# Patient Record
Sex: Female | Born: 1950 | Race: White | Hispanic: No | Marital: Single | Smoking: Never smoker
Health system: Southern US, Community
[De-identification: ages and names within clinical notes are randomized; demographics above are authoritative.]

## PROBLEM LIST (undated history)

## (undated) DIAGNOSIS — R0600 Dyspnea, unspecified: Secondary | ICD-10-CM

## (undated) DIAGNOSIS — F32A Depression, unspecified: Secondary | ICD-10-CM

## (undated) DIAGNOSIS — E039 Hypothyroidism, unspecified: Secondary | ICD-10-CM

## (undated) DIAGNOSIS — K219 Gastro-esophageal reflux disease without esophagitis: Secondary | ICD-10-CM

## (undated) DIAGNOSIS — J3089 Other allergic rhinitis: Secondary | ICD-10-CM

## (undated) DIAGNOSIS — M1990 Unspecified osteoarthritis, unspecified site: Secondary | ICD-10-CM

## (undated) DIAGNOSIS — G4730 Sleep apnea, unspecified: Secondary | ICD-10-CM

## (undated) DIAGNOSIS — E785 Hyperlipidemia, unspecified: Secondary | ICD-10-CM

## (undated) DIAGNOSIS — F329 Major depressive disorder, single episode, unspecified: Secondary | ICD-10-CM

## (undated) DIAGNOSIS — C9590 Leukemia, unspecified not having achieved remission: Secondary | ICD-10-CM

## (undated) DIAGNOSIS — Z972 Presence of dental prosthetic device (complete) (partial): Secondary | ICD-10-CM

## (undated) DIAGNOSIS — R062 Wheezing: Secondary | ICD-10-CM

## (undated) DIAGNOSIS — I10 Essential (primary) hypertension: Secondary | ICD-10-CM

## (undated) HISTORY — DX: Depression, unspecified: F32.A

## (undated) HISTORY — DX: Major depressive disorder, single episode, unspecified: F32.9

## (undated) HISTORY — DX: Hyperlipidemia, unspecified: E78.5

---

## 1983-04-26 HISTORY — PX: FOOT SURGERY: SHX648

## 1987-04-26 HISTORY — PX: ABDOMINAL HYSTERECTOMY: SHX81

## 1997-09-05 ENCOUNTER — Ambulatory Visit (HOSPITAL_COMMUNITY): Admission: RE | Admit: 1997-09-05 | Discharge: 1997-09-05 | Payer: Self-pay | Admitting: Obstetrics and Gynecology

## 1999-02-11 ENCOUNTER — Other Ambulatory Visit: Admission: RE | Admit: 1999-02-11 | Discharge: 1999-02-11 | Payer: Self-pay | Admitting: Obstetrics and Gynecology

## 1999-02-12 ENCOUNTER — Encounter: Payer: Self-pay | Admitting: Obstetrics and Gynecology

## 1999-02-12 ENCOUNTER — Ambulatory Visit (HOSPITAL_COMMUNITY): Admission: RE | Admit: 1999-02-12 | Discharge: 1999-02-12 | Payer: Self-pay | Admitting: Obstetrics and Gynecology

## 2000-02-18 ENCOUNTER — Ambulatory Visit (HOSPITAL_COMMUNITY): Admission: RE | Admit: 2000-02-18 | Discharge: 2000-02-18 | Payer: Self-pay | Admitting: Obstetrics and Gynecology

## 2000-02-18 ENCOUNTER — Encounter: Payer: Self-pay | Admitting: Obstetrics and Gynecology

## 2000-02-25 ENCOUNTER — Encounter: Admission: RE | Admit: 2000-02-25 | Discharge: 2000-02-25 | Payer: Self-pay | Admitting: Obstetrics and Gynecology

## 2000-02-25 ENCOUNTER — Encounter: Payer: Self-pay | Admitting: Obstetrics and Gynecology

## 2001-08-24 ENCOUNTER — Encounter: Payer: Self-pay | Admitting: Obstetrics and Gynecology

## 2001-08-24 ENCOUNTER — Ambulatory Visit (HOSPITAL_COMMUNITY): Admission: RE | Admit: 2001-08-24 | Discharge: 2001-08-24 | Payer: Self-pay | Admitting: Obstetrics and Gynecology

## 2002-12-26 ENCOUNTER — Ambulatory Visit (HOSPITAL_COMMUNITY): Admission: RE | Admit: 2002-12-26 | Discharge: 2002-12-26 | Payer: Self-pay | Admitting: Obstetrics and Gynecology

## 2002-12-26 ENCOUNTER — Encounter: Payer: Self-pay | Admitting: Obstetrics and Gynecology

## 2006-08-31 ENCOUNTER — Ambulatory Visit (HOSPITAL_COMMUNITY): Admission: RE | Admit: 2006-08-31 | Discharge: 2006-08-31 | Payer: Self-pay | Admitting: Obstetrics and Gynecology

## 2007-11-07 ENCOUNTER — Ambulatory Visit: Payer: Self-pay | Admitting: Internal Medicine

## 2008-02-11 ENCOUNTER — Emergency Department: Payer: Self-pay | Admitting: Emergency Medicine

## 2009-04-22 ENCOUNTER — Ambulatory Visit: Payer: Self-pay

## 2009-11-03 ENCOUNTER — Other Ambulatory Visit: Payer: Self-pay | Admitting: Internal Medicine

## 2010-05-17 ENCOUNTER — Other Ambulatory Visit: Payer: Self-pay | Admitting: Internal Medicine

## 2011-01-13 ENCOUNTER — Telehealth: Payer: Self-pay | Admitting: Internal Medicine

## 2011-01-13 NOTE — Telephone Encounter (Signed)
Will let patient know if we get any singulair in.

## 2011-01-13 NOTE — Telephone Encounter (Signed)
Pt would like you to call her when you get singular samples in.

## 2011-02-21 ENCOUNTER — Other Ambulatory Visit: Payer: Self-pay | Admitting: Internal Medicine

## 2011-06-08 ENCOUNTER — Telehealth: Payer: Self-pay | Admitting: *Deleted

## 2011-06-08 NOTE — Telephone Encounter (Signed)
Pt called stating she has cold symptoms- clear nasal drainage, cough with yellow mucous, some shortness of breath, wheezing.  Advised pt that she needs to be seen to get antibiotics but she says that you are familiar with her problems and will likely treat her without seeing her.  [facility] [place].

## 2011-06-08 NOTE — Telephone Encounter (Signed)
Advised pt, she made appt to see Dr. Darrick Huntsman on Monday.

## 2011-06-08 NOTE — Telephone Encounter (Signed)
She has not been seen in the new office, which means she has not been seen in over 8 months.  I cannot call meds in for her unless she is needing a refill on her inhalers.  It does not sound like she has a bacterial infection .  There is a viral syndrome going around.  Delsym for cough,  Sudafed PE for congestion   Benadryl for runny  nose,

## 2011-06-13 ENCOUNTER — Ambulatory Visit (INDEPENDENT_AMBULATORY_CARE_PROVIDER_SITE_OTHER): Payer: Self-pay | Admitting: Internal Medicine

## 2011-06-13 ENCOUNTER — Encounter: Payer: Self-pay | Admitting: Internal Medicine

## 2011-06-13 DIAGNOSIS — J45901 Unspecified asthma with (acute) exacerbation: Secondary | ICD-10-CM

## 2011-06-13 DIAGNOSIS — J321 Chronic frontal sinusitis: Secondary | ICD-10-CM

## 2011-06-13 MED ORDER — METHYLPREDNISOLONE ACETATE 40 MG/ML IJ SUSP
40.0000 mg | Freq: Once | INTRAMUSCULAR | Status: AC
  Administered 2011-06-13: 40 mg via INTRAMUSCULAR

## 2011-06-13 MED ORDER — PREDNISONE (PAK) 10 MG PO TABS: ORAL_TABLET | ORAL | Status: DC

## 2011-06-13 MED ORDER — METHYLPREDNISOLONE ACETATE 40 MG/ML IJ SUSP: 40.0000 mg | Freq: Once | INTRAMUSCULAR | Status: DC

## 2011-06-13 MED ORDER — AMOXICILLIN 875 MG PO TABS: 875.0000 mg | ORAL_TABLET | Freq: Two times a day (BID) | ORAL | Status: DC

## 2011-06-13 NOTE — Progress Notes (Addendum)
Subjective:    Patient ID: Sarah [name], female    DOB: [birth year], 61 y.o.   MRN: [id number]  HPI   61 yr  Old white female with history of asthma presents with asthma exacerbation which started 6 days ago after a stressful event at work .  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  Has been involved in an asthma study in [place] involving two new medications that caused her to become weak and numb feeling  Required trip to Urgent Care with EKG done,  Occurred back in October so she dropped out of the study.  Has been using Dulera twice daily and albuterol MDI.  Productive cough, thick yellow sputum.  Sinus  congestion improving with sudafed PE.  Taking benadyl OTC.    No past medical history on file.  Current Outpatient Prescriptions on File Prior to Visit  Medication Sig Dispense Refill  . citalopram (CELEXA) 20 MG tablet TAKE 1 TABLET BY MOUTH EVERY DAY  30 tablet  4   No current facility-administered medications on file prior to visit.    Review of Systems  Constitutional: Negative for fever, chills and unexpected weight change.  HENT: Positive for congestion, sneezing and sinus pressure. Negative for hearing loss, ear pain, nosebleeds, sore throat, facial swelling, rhinorrhea, mouth sores, trouble swallowing, neck pain, neck stiffness, voice change, postnasal drip, tinnitus and ear discharge.   Eyes: Negative for pain, discharge, redness and visual disturbance.  Respiratory: Positive for chest tightness, shortness of breath and wheezing. Negative for cough and stridor.   Cardiovascular: Negative for chest pain, palpitations and leg swelling.  Musculoskeletal: Negative for myalgias and arthralgias.  Skin: Negative for color change and rash.  Neurological: Positive for headaches. Negative for dizziness, weakness and light-headedness.  Hematological: Negative for adenopathy.       Objective:   Physical Exam  Constitutional: She is oriented to person,  place, and time. She appears well-developed and well-nourished.  HENT:  Mouth/Throat: Oropharynx is clear and moist.  Eyes: EOM are normal. Pupils are equal, round, and reactive to light. No scleral icterus.  Neck: Normal range of motion. Neck supple. No JVD present. No thyromegaly present.  Cardiovascular: Normal rate, regular rhythm, normal heart sounds and intact distal pulses.   Pulmonary/Chest: Effort normal. She has wheezes.  Abdominal: Soft. Bowel sounds are normal. She exhibits no mass. There is no tenderness.  Musculoskeletal: Normal range of motion. She exhibits no edema.  Lymphadenopathy:    She has no cervical adenopathy.  Neurological: She is alert and oriented to person, place, and time.  Skin: Skin is warm and dry.  Psychiatric: She has a normal mood and affect.      Assessment & Plan:   Asthma exacerbation Secondary to polonged sinus infection .  She was given an albuterol nebulizer and a dose of IM DepoMedrol in office with good tolerance.  Steroid taper, amoxicillin.     Updated Medication List Outpatient Encounter Prescriptions as of 06/13/2011  Medication Sig Dispense Refill  . albuterol (PROVENTIL HFA;VENTOLIN HFA) 108 (90 BASE) MCG/ACT inhaler 2 puffs as needed      . citalopram (CELEXA) 20 MG tablet TAKE 1 TABLET BY MOUTH EVERY DAY  30 tablet  4  . diphenhydrAMINE (BENADRYL) 25 MG tablet Take 25 mg by mouth every 6 (six) hours as needed.      . Mometasone Furo-Formoterol Fum (DULERA IN) 2 puffs twice a day      . Pseudoephedrine  HCl (DECONGESTANT PO) Take by mouth.      Marland Kitchen amoxicillin (AMOXIL) 875 MG tablet Take 1 tablet (875 mg total) by mouth 2 (two) times daily.  14 tablet  0  . predniSONE (STERAPRED UNI-PAK) 10 MG tablet 6 tablets on Day 1 , then reduce by 1 tablet daily until gone  21 tablet  0   Facility-Administered Encounter Medications as of 06/13/2011  Medication Dose Route Frequency Provider Last Rate Last Dose  . methylPREDNISolone acetate  (DEPO-MEDROL) injection 40 mg  40 mg Intramuscular Once Duncan Dull, MD   40 mg at 06/13/11 1400  . methylPREDNISolone acetate (DEPO-MEDROL) injection 40 mg  40 mg Intramuscular Once Duncan Dull, MD

## 2011-06-13 NOTE — Patient Instructions (Addendum)
I am treating you for an asthma attack and a sinus infection with prednisone taper and amoxicillin twice dailu for one week   If you run out of dulera,  You can switch to symbicort:  2 puffs  Every 12 hours   Return in one week if you are not improved

## 2011-06-13 NOTE — Assessment & Plan Note (Signed)
Secondary to polonged sinus infection .  She was given an albuterol nebulizer and a dose of IM DepoMedrol in office with good tolerance.  Steroid taper, amoxicillin.

## 2011-06-16 MED ORDER — ALBUTEROL SULFATE (2.5 MG/3ML) 0.083% IN NEBU
2.5000 mg | INHALATION_SOLUTION | Freq: Once | RESPIRATORY_TRACT | Status: AC
  Administered 2011-06-16: 2.5 mg via RESPIRATORY_TRACT

## 2011-06-16 NOTE — Progress Notes (Signed)
Addended by: Eliezer Bottom on: 06/16/2011 12:47 PM   Modules accepted: Orders

## 2011-06-21 ENCOUNTER — Ambulatory Visit (INDEPENDENT_AMBULATORY_CARE_PROVIDER_SITE_OTHER): Payer: Self-pay | Admitting: Internal Medicine

## 2011-06-21 ENCOUNTER — Encounter: Payer: Self-pay | Admitting: Internal Medicine

## 2011-06-21 DIAGNOSIS — F329 Major depressive disorder, single episode, unspecified: Secondary | ICD-10-CM | POA: Insufficient documentation

## 2011-06-21 DIAGNOSIS — J45901 Unspecified asthma with (acute) exacerbation: Secondary | ICD-10-CM

## 2011-06-21 DIAGNOSIS — J321 Chronic frontal sinusitis: Secondary | ICD-10-CM

## 2011-06-21 DIAGNOSIS — F32A Depression, unspecified: Secondary | ICD-10-CM | POA: Insufficient documentation

## 2011-06-21 NOTE — Progress Notes (Signed)
Subjective:    Patient ID: Sarah [name], female    DOB: 1951-01-31, 61 y.o.   MRN: [id number]  HPI  Sarah [name] is a 61 year old female with a history of asthma who returns for followup following an asthma exacerbation last week. She Finished abx yesterday   and is continuing to use Dulera  twice daily.  She feels that her breathing has improved back to baseline continues to have thick nonpurulent mucoid discharge from both sinuses and throat.  No significant cough no orthopnea no shortness of breath. Past Medical History  Diagnosis Date  . Asthma   . Depression    Current Outpatient Prescriptions on File Prior to Visit  Medication Sig Dispense Refill  . albuterol (PROVENTIL HFA;VENTOLIN HFA) 108 (90 BASE) MCG/ACT inhaler 2 puffs as needed      . citalopram (CELEXA) 20 MG tablet TAKE 1 TABLET BY MOUTH EVERY DAY  30 tablet  4  . diphenhydrAMINE (BENADRYL) 25 MG tablet Take 25 mg by mouth every 6 (six) hours as needed.      . Mometasone Furo-Formoterol Fum (DULERA IN) 2 puffs twice a day      . Pseudoephedrine HCl (DECONGESTANT PO) Take by mouth.       Current Facility-Administered Medications on File Prior to Visit  Medication Dose Route Frequency Provider Last Rate Last Dose  . DISCONTD: methylPREDNISolone acetate (DEPO-MEDROL) injection 40 mg  40 mg Intramuscular Once Duncan Dull, MD       Review of Systems  Constitutional: Negative for fever, chills and unexpected weight change.  HENT: Positive for postnasal drip. Negative for hearing loss, ear pain, nosebleeds, congestion, sore throat, facial swelling, rhinorrhea, sneezing, mouth sores, trouble swallowing, neck pain, neck stiffness, voice change, sinus pressure, tinnitus and ear discharge.   Eyes: Negative for pain, discharge, redness and visual disturbance.  Respiratory: Negative for cough, chest tightness, shortness of breath, wheezing and stridor.   Cardiovascular: Negative for chest pain, palpitations and leg swelling.    Musculoskeletal: Negative for myalgias and arthralgias.  Skin: Negative for color change and rash.  Neurological: Negative for dizziness, weakness, light-headedness and headaches.  Hematological: Negative for adenopathy.       Objective:   Physical Exam  Constitutional: She is oriented to person, place, and time. She appears well-developed and well-nourished.  HENT:  Mouth/Throat: Oropharynx is clear and moist.  Eyes: EOM are normal. Pupils are equal, round, and reactive to light. No scleral icterus.  Neck: Normal range of motion. Neck supple. No JVD present. No thyromegaly present.  Cardiovascular: Normal rate, regular rhythm, normal heart sounds and intact distal pulses.   Pulmonary/Chest: Effort normal and breath sounds normal.  Abdominal: Soft. Bowel sounds are normal. She exhibits no mass. There is no tenderness.  Musculoskeletal: Normal range of motion. She exhibits no edema.  Lymphadenopathy:    She has cervical adenopathy.  Neurological: She is alert and oriented to person, place, and time.  Skin: Skin is warm and dry.  Psychiatric: She has a normal mood and affect.      Assessment & Plan:   Asthma exacerbation Resolved likely triggered by viral URI. Continue to let her until samples are gone. At that time we'll consider switching her to a more portable steroid bronchodilator for chronic maintenance.  Sinusitis chronic, frontal Her symptoms are chronic and she has no signs of bacterial infection on exam today. I suggested that she start Simply Saline nasal lavage twice daily at a minimum and used Sudafed PE for the next  few days 20-30 mg every 6 hours for congestion    Updated Medication List Outpatient Encounter Prescriptions as of 06/21/2011  Medication Sig Dispense Refill  . albuterol (PROVENTIL HFA;VENTOLIN HFA) 108 (90 BASE) MCG/ACT inhaler 2 puffs as needed      . citalopram (CELEXA) 20 MG tablet TAKE 1 TABLET BY MOUTH EVERY DAY  30 tablet  4  . diphenhydrAMINE  (BENADRYL) 25 MG tablet Take 25 mg by mouth every 6 (six) hours as needed.      . Mometasone Furo-Formoterol Fum (DULERA IN) 2 puffs twice a day      . Pseudoephedrine HCl (DECONGESTANT PO) Take by mouth.      . DISCONTD: amoxicillin (AMOXIL) 875 MG tablet Take 1 tablet (875 mg total) by mouth 2 (two) times daily.  14 tablet  0  . DISCONTD: predniSONE (STERAPRED UNI-PAK) 10 MG tablet 6 tablets on Day 1 , then reduce by 1 tablet daily until gone  21 tablet  0   Facility-Administered Encounter Medications as of 06/21/2011  Medication Dose Route Frequency Provider Last Rate Last Dose  . DISCONTD: methylPREDNISolone acetate (DEPO-MEDROL) injection 40 mg  40 mg Intramuscular Once Duncan Dull, MD

## 2011-06-21 NOTE — Assessment & Plan Note (Signed)
Resolved likely triggered by viral URI. Continue to let her until samples are gone. At that time we'll consider switching her to a more portable steroid bronchodilator for chronic maintenance.

## 2011-06-21 NOTE — Assessment & Plan Note (Signed)
Her symptoms are chronic and she has no signs of bacterial infection on exam today. I suggested that she start Simply Saline nasal lavage twice daily at a minimum and used Sudafed PE for the next few days 20-30 mg every 6 hours for congestion

## 2011-06-21 NOTE — Patient Instructions (Signed)
You need to use Simply Saline to flush your sinuses twice daily  ,  Morning and evening.  Resume Sudafed PE   20 mg every 6 hours to reduce the pressure  in your sinuses  Drink a minimum of 3 16 ounce glasses  Of water or orange juice minimum daily.   Resume mucinex (guaifenesin )  Up to 600 mg twice daily to thin out the mucus   Avoid dairy

## 2011-07-29 ENCOUNTER — Other Ambulatory Visit: Payer: Self-pay | Admitting: Internal Medicine

## 2011-07-29 MED ORDER — CITALOPRAM HYDROBROMIDE 20 MG PO TABS: 20.0000 mg | ORAL_TABLET | Freq: Every day | ORAL | Status: DC

## 2012-01-11 ENCOUNTER — Other Ambulatory Visit: Payer: Self-pay | Admitting: Internal Medicine

## 2012-01-11 MED ORDER — CITALOPRAM HYDROBROMIDE 20 MG PO TABS: 20.0000 mg | ORAL_TABLET | Freq: Every day | ORAL | Status: DC

## 2012-01-30 ENCOUNTER — Encounter: Payer: Self-pay | Admitting: Internal Medicine

## 2012-07-11 ENCOUNTER — Other Ambulatory Visit: Payer: Self-pay | Admitting: *Deleted

## 2012-07-13 MED ORDER — CITALOPRAM HYDROBROMIDE 20 MG PO TABS: 20.0000 mg | ORAL_TABLET | Freq: Every day | ORAL | Status: DC

## 2013-02-21 ENCOUNTER — Other Ambulatory Visit: Payer: Self-pay | Admitting: *Deleted

## 2013-02-22 ENCOUNTER — Other Ambulatory Visit: Payer: Self-pay | Admitting: *Deleted

## 2013-04-26 ENCOUNTER — Ambulatory Visit (INDEPENDENT_AMBULATORY_CARE_PROVIDER_SITE_OTHER): Payer: BC Managed Care – PPO | Admitting: Internal Medicine

## 2013-04-26 VITALS — BP 130/88 | HR 68 | Temp 98.8°F | Resp 12 | Wt 178.0 lb

## 2013-04-26 DIAGNOSIS — F329 Major depressive disorder, single episode, unspecified: Secondary | ICD-10-CM

## 2013-04-26 DIAGNOSIS — Z90710 Acquired absence of both cervix and uterus: Secondary | ICD-10-CM

## 2013-04-26 DIAGNOSIS — F32A Depression, unspecified: Secondary | ICD-10-CM

## 2013-04-26 DIAGNOSIS — F3289 Other specified depressive episodes: Secondary | ICD-10-CM

## 2013-04-26 DIAGNOSIS — Z23 Encounter for immunization: Secondary | ICD-10-CM

## 2013-04-26 DIAGNOSIS — Z0000 Encounter for general adult medical examination without abnormal findings: Secondary | ICD-10-CM

## 2013-04-26 MED ORDER — CITALOPRAM HYDROBROMIDE 20 MG PO TABS: 20.0000 mg | ORAL_TABLET | Freq: Every day | ORAL | Status: DC

## 2013-04-26 NOTE — Patient Instructions (Signed)
I recommend that you have a mammogram and a colon cancer screening test as soon as you can afford them  Fasting labs when you can afford them.

## 2013-04-26 NOTE — Progress Notes (Signed)
Patient ID: Sarah [name], female   DOB: [birth year], 63 y.o.   MRN: [id number]  Patient Active Problem List   Diagnosis Date Noted  . S/P hysterectomy 04/28/2013  . Routine general medical examination at a health care facility 04/28/2013  . Asthma   . Depression     Subjective:  CC:   Chief Complaint  Patient presents with  . Medication Refill    HPI:   Sarah Barillas Wrightis a 63 y.o. female who presents for follow up on chronic conditions including asthma, chronic depression, and overweight .  She was last seen Dec 2013. She remains uninsured due to cost and lack of gainful employment.  She feels generally well, and has no new issues   She does not use a steroid/LABA daily and  has not used her MDI more than 4 times in the past year. Se continues to struggle with depression after the death of her mother which occurred 4 years ago . Some seasons have been worse than others.  She is currently requesting a refill on the citalopram  Takes an allergy pill  And a reflux medication daily .       Past Medical History  Diagnosis Date  . Asthma   . Depression     History reviewed. No pertinent past surgical history.     The following portions of the patient's history were reviewed and updated as appropriate: Allergies, current medications, and problem list.    Review of Systems:   12 Pt  review of systems was negative except those addressed in the HPI,     History   Social History  . Marital Status: Single    Spouse Name: N/A    Number of Children: N/A  . Years of Education: N/A   Occupational History  . Not on file.   Social History Main Topics  . Smoking status: Never Smoker   . Smokeless tobacco: Never Used  . Alcohol Use: No  . Drug Use: No  . Sexual Activity: No   Other Topics Concern  . Not on file   Social History Narrative  . No narrative on file    Objective:  Filed Vitals:   04/28/13 1222  BP: 130/88  Pulse: 68  Temp: 98.8 F (37.1 C)   Resp: 12     General appearance: alert, cooperative and appears stated age Ears: normal TM's and external ear canals both ears Throat: lips, mucosa, and tongue normal; teeth and gums normal Neck: no adenopathy, no carotid bruit, supple, symmetrical, trachea midline and thyroid not enlarged, symmetric, no tenderness/mass/nodules Back: symmetric, no curvature. ROM normal. No CVA tenderness. Lungs: clear to auscultation bilaterally Heart: regular rate and rhythm, S1, S2 normal, no murmur, click, rub or gallop Abdomen: soft, non-tender; bowel sounds normal; no masses,  no organomegaly Pulses: 2+ and symmetric Skin: Skin color, texture, turgor normal. No rashes or lesions Lymph nodes: Cervical, supraclavicular, and axillary nodes normal.  Assessment and Plan:  Routine general medical examination at a health care facility Annual comprehensive exam was done excluding and pelvic exam. All screenings have been addressed and offerred but she has chosen to defer mammogram and colonoscopy due to anticipated loss of insurance.   Depression Chronic, mild.  Continue citalopram.    Updated Medication List Outpatient Encounter Prescriptions as of 04/26/2013  Medication Sig  . albuterol (PROVENTIL HFA;VENTOLIN HFA) 108 (90 BASE) MCG/ACT inhaler 2 puffs as needed  . citalopram (CELEXA) 20 MG tablet Take 1 tablet (20 mg total)  by mouth daily.  . [DISCONTINUED] citalopram (CELEXA) 20 MG tablet Take 1 tablet (20 mg total) by mouth daily.  . [DISCONTINUED] diphenhydrAMINE (BENADRYL) 25 MG tablet Take 25 mg by mouth every 6 (six) hours as needed.  . [DISCONTINUED] Mometasone Furo-Formoterol Fum (DULERA IN) 2 puffs twice a day  . [DISCONTINUED] Pseudoephedrine HCl (DECONGESTANT PO) Take by mouth.     Orders Placed This Encounter  Procedures  . Pneumococcal polysaccharide vaccine 23-valent greater than or equal to 64yo subcutaneous/IM  . Flu Vaccine QUAD 36+ mos IM    No Follow-up on file.

## 2013-04-26 NOTE — Progress Notes (Signed)
Pre-visit discussion using our clinic review tool. No additional management support is needed unless otherwise documented below in the visit note.  

## 2013-04-28 ENCOUNTER — Encounter: Payer: Self-pay | Admitting: Internal Medicine

## 2013-04-28 DIAGNOSIS — Z90710 Acquired absence of both cervix and uterus: Secondary | ICD-10-CM | POA: Insufficient documentation

## 2013-04-28 DIAGNOSIS — Z0000 Encounter for general adult medical examination without abnormal findings: Secondary | ICD-10-CM | POA: Insufficient documentation

## 2013-04-28 NOTE — Assessment & Plan Note (Signed)
Annual comprehensive exam was done excluding and pelvic exam. All screenings have been addressed and offerred but she has chosen to defer mammogram and colonoscopy due to anticipated loss of insurance.

## 2013-04-28 NOTE — Assessment & Plan Note (Signed)
Chronic, mild.  Continue citalopram.

## 2013-11-11 ENCOUNTER — Other Ambulatory Visit: Payer: Self-pay | Admitting: *Deleted

## 2013-11-11 MED ORDER — CITALOPRAM HYDROBROMIDE 20 MG PO TABS: 20.0000 mg | ORAL_TABLET | Freq: Every day | ORAL | Status: DC

## 2013-11-11 NOTE — Telephone Encounter (Signed)
Appt 11/21/13

## 2013-11-21 ENCOUNTER — Ambulatory Visit (INDEPENDENT_AMBULATORY_CARE_PROVIDER_SITE_OTHER): Payer: BC Managed Care – PPO | Admitting: Internal Medicine

## 2013-11-21 ENCOUNTER — Encounter: Payer: Self-pay | Admitting: Internal Medicine

## 2013-11-21 VITALS — BP 150/98 | HR 67 | Temp 98.0°F | Resp 18 | Ht 63.0 in | Wt 179.5 lb

## 2013-11-21 DIAGNOSIS — R5381 Other malaise: Secondary | ICD-10-CM

## 2013-11-21 DIAGNOSIS — K2100 Gastro-esophageal reflux disease with esophagitis, without bleeding: Secondary | ICD-10-CM

## 2013-11-21 DIAGNOSIS — Z1239 Encounter for other screening for malignant neoplasm of breast: Secondary | ICD-10-CM

## 2013-11-21 DIAGNOSIS — R030 Elevated blood-pressure reading, without diagnosis of hypertension: Secondary | ICD-10-CM

## 2013-11-21 DIAGNOSIS — R5383 Other fatigue: Secondary | ICD-10-CM

## 2013-11-21 DIAGNOSIS — K209 Esophagitis, unspecified without bleeding: Secondary | ICD-10-CM

## 2013-11-21 DIAGNOSIS — J45909 Unspecified asthma, uncomplicated: Secondary | ICD-10-CM

## 2013-11-21 DIAGNOSIS — E785 Hyperlipidemia, unspecified: Secondary | ICD-10-CM

## 2013-11-21 LAB — CBC WITH DIFFERENTIAL/PLATELET
BASOS ABS: 0 10*3/uL (ref 0.0–0.1)
Basophils Relative: 0.6 % (ref 0.0–3.0)
Eosinophils Absolute: 0.1 10*3/uL (ref 0.0–0.7)
Eosinophils Relative: 2.1 % (ref 0.0–5.0)
HCT: 38.5 % (ref 36.0–46.0)
HEMOGLOBIN: 13.1 g/dL (ref 12.0–15.0)
Lymphocytes Relative: 29.3 % (ref 12.0–46.0)
Lymphs Abs: 1.7 10*3/uL (ref 0.7–4.0)
MCHC: 33.9 g/dL (ref 30.0–36.0)
MCV: 92.5 fl (ref 78.0–100.0)
MONOS PCT: 6.3 % (ref 3.0–12.0)
Monocytes Absolute: 0.4 10*3/uL (ref 0.1–1.0)
NEUTROS ABS: 3.6 10*3/uL (ref 1.4–7.7)
NEUTROS PCT: 61.7 % (ref 43.0–77.0)
Platelets: 215 10*3/uL (ref 150.0–400.0)
RBC: 4.17 Mil/uL (ref 3.87–5.11)
RDW: 14.4 % (ref 11.5–15.5)
WBC: 5.9 10*3/uL (ref 4.0–10.5)

## 2013-11-21 LAB — COMPREHENSIVE METABOLIC PANEL
ALT: 20 U/L (ref 0–35)
AST: 17 U/L (ref 0–37)
Albumin: 4.1 g/dL (ref 3.5–5.2)
Alkaline Phosphatase: 93 U/L (ref 39–117)
BUN: 15 mg/dL (ref 6–23)
CALCIUM: 10 mg/dL (ref 8.4–10.5)
CHLORIDE: 102 meq/L (ref 96–112)
CO2: 30 meq/L (ref 19–32)
Creatinine, Ser: 1 mg/dL (ref 0.4–1.2)
GFR: 62.12 mL/min (ref >60.00)
Glucose, Bld: 97 mg/dL (ref 70–99)
Potassium: 4.9 mEq/L (ref 3.5–5.1)
SODIUM: 140 meq/L (ref 135–145)
TOTAL PROTEIN: 7.4 g/dL (ref 6.0–8.3)
Total Bilirubin: 0.6 mg/dL (ref 0.2–1.2)

## 2013-11-21 LAB — LIPID PANEL
CHOL/HDL RATIO: 4
Cholesterol: 240 mg/dL — ABNORMAL HIGH (ref 0–200)
HDL: 58.4 mg/dL (ref >39.00)
LDL CALC: 162 mg/dL — AB (ref 0–99)
NONHDL: 181.6
Triglycerides: 98 mg/dL (ref 0.0–149.0)
VLDL: 19.6 mg/dL (ref 0.0–40.0)

## 2013-11-21 LAB — H. PYLORI ANTIBODY, IGG: H PYLORI IGG: NEGATIVE

## 2013-11-21 LAB — TSH: TSH: 2.09 u[IU]/mL (ref 0.35–4.50)

## 2013-11-21 MED ORDER — PANTOPRAZOLE SODIUM 40 MG PO TBEC: 40.0000 mg | DELAYED_RELEASE_TABLET | Freq: Every day | ORAL | Status: DC

## 2013-11-21 MED ORDER — FAMOTIDINE 20 MG PO TABS: 20.0000 mg | ORAL_TABLET | Freq: Every day | ORAL | Status: DC

## 2013-11-21 NOTE — Progress Notes (Signed)
Pre-visit discussion using our clinic review tool. No additional management support is needed unless otherwise documented below in the visit note.  

## 2013-11-21 NOTE — Progress Notes (Signed)
Patient ID: Sarah [name], female   DOB: Apr 18, 1951, 63 y.o.   MRN: [id number]  Patient Active Problem List   Diagnosis Date Noted  . Elevated blood-pressure reading without diagnosis of hypertension 11/24/2013  . Esophagitis, reflux 11/24/2013  . S/P hysterectomy 04/28/2013  . Routine general medical examination at a health care facility 04/28/2013  . Unspecified asthma(493.90)   . Depression     Subjective:  CC:   Chief Complaint  Patient presents with  . Asthma    Flare up has to stay in side where it is cool.  Donny Pique Reflux    Would like to discuss    HPI:   Sarah [name] is a 63 y.o. female who presents for Recurrent chest pain which patient has attributed to acid reflux.  She has had symptos of reflux accompanied by occasional vomiting .  Worst episode occurred  2 weeks ago while eating a meal at a local steakhouse.  The meal included a  Deep fried, battered onion and  Steak with grilled onions,  She unfortunately vomited in the restaurant 5 times. Then developed diarrhea which lasted one day.   Another recent episode after eating spaghetti and lemonade.  Vomited up the lemonade but not the food .  She denies dysphagia.  She has been taking omeprazole daily after eating .    2)  Asthma with recent exacerbation .  She as been remaining indoors  as much as possible, Using albuterol once or twice per week.   3) Lost to follow up with lapse in insurance.  She is now insured via [employer] systems [employer] $21/month   Past Medical History  Diagnosis Date  . Asthma   . Depression     No past surgical history on file.     The following portions of the patient's history were reviewed and updated as appropriate: Allergies, current medications, and problem list.    Review of Systems:   Patient denies headache, fevers, malaise, unintentional weight loss, skin rash, eye pain, sinus congestion and sinus pain, sore throat, dysphagia,  hemoptysis , cough, dyspnea,  wheezing, chest pain, palpitations, orthopnea, edema, abdominal pain, nausea, melena, diarrhea, constipation, flank pain, dysuria, hematuria, urinary  Frequency, nocturia, numbness, tingling, seizures,  Focal weakness, Loss of consciousness,  Tremor, insomnia, depression, anxiety, and suicidal ideation.     History   Social History  . Marital Status: Single    Spouse Name: N/A    Number of Children: N/A  . Years of Education: N/A   Occupational History  . Not on file.   Social History Main Topics  . Smoking status: Never Smoker   . Smokeless tobacco: Never Used  . Alcohol Use: No  . Drug Use: No  . Sexual Activity: No   Other Topics Concern  . Not on file   Social History Narrative  . No narrative on file    Objective:  Filed Vitals:   11/21/13 1017  BP: 150/98  Pulse: 67  Temp: 98 F (36.7 C)  Resp: 18     General appearance: alert, cooperative and appears stated age Ears: normal TM's and external ear canals both ears Throat: lips, mucosa, and tongue normal; teeth and gums normal Neck: no adenopathy, no carotid bruit, supple, symmetrical, trachea midline and thyroid not enlarged, symmetric, no tenderness/mass/nodules Back: symmetric, no curvature. ROM normal. No CVA tenderness. Lungs: clear to auscultation bilaterally Heart: regular rate and rhythm, S1, S2 normal, no murmur, click, rub or gallop Abdomen: soft,  non-tender; bowel sounds normal; no masses,  no organomegaly Pulses: 2+ and symmetric Skin: Skin color, texture, turgor normal. No rashes or lesions Lymph nodes: Cervical, supraclavicular, and axillary nodes normal.  Assessment and Plan:  Elevated blood-pressure reading without diagnosis of hypertension She  has no prior history of hypertension. She will check his blood pressure several times over the next 3-4 weeks and to submit readings for evaluation.   Esophagitis, reflux Presumed based on history. H Pylori is negative and LFTS are nornal.  Trial  of protonix .  Return in one month  Unspecified asthma(493.90) Currently asymptomatic, but given her recent exacerbations requiring use of rescue inhaler,  She was advised to resume daily use od  Dulera since she has samples at home   Updated Medication List Outpatient Encounter Prescriptions as of 11/21/2013  Medication Sig  . albuterol (PROVENTIL HFA;VENTOLIN HFA) 108 (90 BASE) MCG/ACT inhaler 2 puffs as needed  . calcium carbonate (OS-CAL) 600 MG TABS tablet Take 600 mg by mouth 2 (two) times daily with a meal.  . citalopram (CELEXA) 20 MG tablet Take 1 tablet (20 mg total) by mouth daily.  Nyoka Cowden Tea, Camillia sinensis, ([facility] GREEN TEA EXTRACT PO) Take 1 tablet by mouth 2 (two) times daily.  . Omega-3 Fatty Acids (FISH OIL) 1200 MG CAPS Take 1 capsule by mouth daily.  Marland Kitchen omeprazole (PRILOSEC) 20 MG capsule Take 20 mg by mouth 2 (two) times daily before a meal.  . OVER THE COUNTER MEDICATION Take 25 mg by mouth daily. Equate allergy relief  . famotidine (PEPCID) 20 MG tablet Take 1 tablet (20 mg total) by mouth daily. Before dinner  . pantoprazole (PROTONIX) 40 MG tablet Take 1 tablet (40 mg total) by mouth daily. In am 30 minutes before  Eating     Orders Placed This Encounter  Procedures  . MM DIGITAL SCREENING BILATERAL  . H. pylori antibody, IgG  . Comprehensive metabolic panel  . TSH  . Lipid panel  . CBC with Differential    Return in about 4 weeks (around 12/19/2013).

## 2013-11-21 NOTE — Patient Instructions (Addendum)
You can try the [facility] inhaler twice daily (the one with the 2014 expiration date).  If you don't feel any difference,  It is no longer any good.   I am treating you for reflux esophagitis  With a medication called pantoprazole.  TAKE IT IN THE MORNING 30 minutes  BEFORE you eat or drink food.  I am also prescribing famotidine (generic pepcid) to take 15 minutes before you eat  a restaurant meal.   I have also ordered your mammogram  Return in one month   Food Choices for Gastroesophageal Reflux Disease When you have gastroesophageal reflux disease (GERD), the foods you eat and your eating habits are very important. Choosing the right foods can help ease the discomfort of GERD. WHAT GENERAL GUIDELINES DO I NEED TO FOLLOW?  Choose fruits, vegetables, whole grains, low-fat dairy products, and low-fat meat, fish, and poultry.  Limit fats such as oils, salad dressings, butter, nuts, and avocado.  Keep a food diary to identify foods that cause symptoms.  Avoid foods that cause reflux. These may be different for different people.  Eat frequent small meals instead of three large meals each day.  Eat your meals slowly, in a relaxed setting.  Limit fried foods.  Cook foods using methods other than frying.  Avoid drinking alcohol.  Avoid drinking large amounts of liquids with your meals.  Avoid bending over or lying down until 2-3 hours after eating. WHAT FOODS ARE NOT RECOMMENDED? The following are some foods and drinks that may worsen your symptoms: Vegetables Tomatoes. Tomato juice. Tomato and spaghetti sauce. Chili peppers. Onion and garlic. Horseradish. Fruits Oranges, grapefruit, and lemon (fruit and juice). Meats High-fat meats, fish, and poultry. This includes hot dogs, ribs, ham, sausage, salami, and bacon. Dairy Whole milk and chocolate milk. Sour cream. Cream. Butter. Ice cream. Cream cheese.  Beverages Coffee and tea, with or without caffeine. Carbonated beverages or  energy drinks. Condiments Hot sauce. Barbecue sauce.  Sweets/Desserts Chocolate and cocoa. Donuts. Peppermint and spearmint. Fats and Oils High-fat foods, including [place] fries and potato chips. Other Vinegar. Strong spices, such as black pepper, white pepper, red pepper, cayenne, curry powder, cloves, ginger, and chili powder. The items listed above may not be a complete list of foods and beverages to avoid. Contact your dietitian for more information. Document Released: 04/11/2005 Document Revised: 04/16/2013 Document Reviewed: 02/13/2013 [facility] Patient Information 2015 [place], [place]. This information is not intended to replace advice given to you by your health care provider. Make sure you discuss any questions you have with your health care provider.

## 2013-11-22 ENCOUNTER — Encounter: Payer: Self-pay | Admitting: *Deleted

## 2013-11-24 ENCOUNTER — Encounter: Payer: Self-pay | Admitting: Internal Medicine

## 2013-11-24 DIAGNOSIS — K2100 Gastro-esophageal reflux disease with esophagitis, without bleeding: Secondary | ICD-10-CM | POA: Insufficient documentation

## 2013-11-24 DIAGNOSIS — R030 Elevated blood-pressure reading, without diagnosis of hypertension: Secondary | ICD-10-CM | POA: Insufficient documentation

## 2013-11-24 NOTE — Assessment & Plan Note (Signed)
Presumed based on history. H Pylori is negative and LFTS are nornal.  Trial of protonix .  Return in one month

## 2013-11-24 NOTE — Assessment & Plan Note (Signed)
She  has no prior history of hypertension. She will check his blood pressure several times over the next 3-4 weeks and to submit readings for evaluation.

## 2013-11-24 NOTE — Assessment & Plan Note (Addendum)
Currently asymptomatic, but given her recent exacerbations requiring use of rescue inhaler,  She was advised to resume daily use od  Dulera since she has samples at home

## 2013-12-23 ENCOUNTER — Encounter: Payer: Self-pay | Admitting: Internal Medicine

## 2013-12-23 ENCOUNTER — Ambulatory Visit (INDEPENDENT_AMBULATORY_CARE_PROVIDER_SITE_OTHER): Payer: BC Managed Care – PPO | Admitting: Internal Medicine

## 2013-12-23 VITALS — BP 168/90 | HR 73 | Temp 97.9°F | Resp 14 | Ht 63.0 in | Wt 178.5 lb

## 2013-12-23 DIAGNOSIS — M25461 Effusion, right knee: Secondary | ICD-10-CM

## 2013-12-23 DIAGNOSIS — M25561 Pain in right knee: Secondary | ICD-10-CM

## 2013-12-23 DIAGNOSIS — M25469 Effusion, unspecified knee: Secondary | ICD-10-CM

## 2013-12-23 DIAGNOSIS — M25569 Pain in unspecified knee: Secondary | ICD-10-CM

## 2013-12-23 MED ORDER — DICLOFENAC SODIUM ER 100 MG PO TB24: 100.0000 mg | ORAL_TABLET | Freq: Every day | ORAL | Status: DC

## 2013-12-23 MED ORDER — HYDROCODONE-ACETAMINOPHEN 5-325 MG PO TABS: 1.0000 | ORAL_TABLET | Freq: Four times a day (QID) | ORAL | Status: DC | PRN

## 2013-12-23 NOTE — Patient Instructions (Signed)
I cannot order films at [facility] clinic bc I am not a doctor there.  Please go to [facility] or the outpatient imaging center on [address] for your x ays  Referral to Dr Marry Guan is in process   Stop the motrin.  Take the diclofenac once daily with food for your inflammation  Ice the knee fro 15 mintues every 4 to 8 hours  Generic vicodin take one tablet every 6 hours as needed for pain (ok to combine with diclofenac)

## 2013-12-23 NOTE — Progress Notes (Signed)
Pre-visit discussion using our clinic review tool. No additional management support is needed unless otherwise documented below in the visit note.  

## 2013-12-23 NOTE — Assessment & Plan Note (Addendum)
Traumatic, with diffuse swelling, pai on medial side and tibial pain following a fall 8 days ago.  Plain filns to rule out fracture  were negative for fractures, but did note arthritic and degenerative changes with bone spurring.  ortho referral in process. Stop motrin,.  Start diclofenac  ER,  vicodin and continue periiodic  icing.  Suspend yardwork

## 2013-12-23 NOTE — Progress Notes (Addendum)
Patient ID: Sarah [name], female   DOB: 04-09-1951, 63 y.o.   MRN: [id number]   Patient Active Problem List   Diagnosis Date Noted  . Effusion of right knee 12/23/2013  . Elevated blood-pressure reading without diagnosis of hypertension 11/24/2013  . Esophagitis, reflux 11/24/2013  . S/P hysterectomy 04/28/2013  . Routine general medical examination at a health care facility 04/28/2013  . Unspecified asthma(493.90)   . Depression     Subjective:  CC:   Chief Complaint  Patient presents with  . Acute Visit    Fell X 8 days ago lost footing fell on pavement, hit right knee, abrasion noted with swelling.    HPI:   Sarah [name] is a 63 y.o. female who presents for Knee swelling and pain after fall at [facility] 8 days ago.   Stayed off it for 3-4 days and took 3 motrin every 6 hours but did not ice it until Day 6 t   Then resumed yardwork with riding lawnmower and pressure washed her anut's house.  Having difficutl straightening it out due to pain on medial side with extension.   Follow up on chest pain attributed to Acid reflux.  Her symtoms have resolved with daily use of PPI    Past Medical History  Diagnosis Date  . Asthma   . Depression     No past surgical history on file.     The following portions of the patient's history were reviewed and updated as appropriate: Allergies, current medications, and problem list.    Review of Systems:   Patient denies headache, fevers, malaise, unintentional weight loss, skin rash, eye pain, sinus congestion and sinus pain, sore throat, dysphagia,  hemoptysis , cough, dyspnea, wheezing, chest pain, palpitations, orthopnea, edema, abdominal pain, nausea, melena, diarrhea, constipation, flank pain, dysuria, hematuria, urinary  Frequency, nocturia, numbness, tingling, seizures,  Focal weakness, Loss of consciousness,  Tremor, insomnia, depression, anxiety, and suicidal ideation.     History   Social History  . Marital Status:  Single    Spouse Name: N/A    Number of Children: N/A  . Years of Education: N/A   Occupational History  . Not on file.   Social History Main Topics  . Smoking status: Never Smoker   . Smokeless tobacco: Never Used  . Alcohol Use: No  . Drug Use: No  . Sexual Activity: No   Other Topics Concern  . Not on file   Social History Narrative  . No narrative on file    Objective:  Filed Vitals:   12/23/13 1403  BP: 168/90  Pulse: 73  Temp: 97.9 F (36.6 C)  Resp: 14     General appearance: alert, cooperative and appears stated age Neck: no adenopathy, no carotid bruit, supple, symmetrical, trachea midline and thyroid not enlarged, symmetric, no tenderness/mass/nodules Back: symmetric, no curvature. ROM normal. No CVA tenderness. Lungs: clear to auscultation bilaterally Heart: regular rate and rhythm, S1, S2 normal, no murmur, click, rub or gallop Abdomen: soft, non-tender; bowel sounds normal; no masses,  no organomegaly Pulses: 2+ and symmetric MSK:  Right knee with effusion ,  Tender over medial surface of tibial plateau Skin: Skin color, texture, turgor normal. No rashes or lesions Lymph nodes: Cervical, supraclavicular, and axillary nodes normal.  Assessment and Plan:  Effusion of right knee Traumatic, with diffuse swelling, pai on medial side and tibial pain following a fall 8 days ago.  Plain filns to rule out fracture  were negative for fractures,  but did note arthritic and degenerative changes with bone spurring.  ortho referral in process. Stop motrin,.  Start diclofenac  ER,  vicodin and continue periiodic  icing.  Suspend yardwork    Updated Medication List Outpatient Encounter Prescriptions as of 12/23/2013  Medication Sig  . albuterol (PROVENTIL HFA;VENTOLIN HFA) 108 (90 BASE) MCG/ACT inhaler 2 puffs as needed  . calcium carbonate (OS-CAL) 600 MG TABS tablet Take 600 mg by mouth 2 (two) times daily with a meal.  . citalopram (CELEXA) 20 MG tablet Take 1  tablet (20 mg total) by mouth daily.  . famotidine (PEPCID) 20 MG tablet Take 1 tablet (20 mg total) by mouth daily. Before dinner  . Green Tea, Camillia sinensis, ([employer] GREEN TEA EXTRACT PO) Take 1 tablet by mouth 2 (two) times daily.  . Omega-3 Fatty Acids (FISH OIL) 1200 MG CAPS Take 1 capsule by mouth daily.  Marland Kitchen OVER THE COUNTER MEDICATION Take 25 mg by mouth daily. Equate allergy relief  . pantoprazole (PROTONIX) 40 MG tablet Take 1 tablet (40 mg total) by mouth daily. In am 30 minutes before  Eating  . Diclofenac Sodium CR 100 MG 24 hr tablet Take 1 tablet (100 mg total) by mouth daily.  Marland Kitchen HYDROcodone-acetaminophen (NORCO/VICODIN) 5-325 MG per tablet Take 1 tablet by mouth every 6 (six) hours as needed for moderate pain.  Marland Kitchen omeprazole (PRILOSEC) 20 MG capsule Take 20 mg by mouth 2 (two) times daily before a meal.     Orders Placed This Encounter  Procedures  . DG Knee 4 Views W/Patella Right  . AMB referral to orthopedics    No Follow-up on file.

## 2013-12-25 ENCOUNTER — Telehealth: Payer: Self-pay | Admitting: Internal Medicine

## 2013-12-25 DIAGNOSIS — M25461 Effusion, right knee: Secondary | ICD-10-CM | POA: Insufficient documentation

## 2013-12-25 NOTE — Assessment & Plan Note (Signed)
Plain films were negative for fractures, but did note arthritic and degenerative changes with bone spurring

## 2013-12-25 NOTE — Telephone Encounter (Signed)
Called and advised patient of results, verbalized understanding. Advised our [facility] would be calling with appointment once it has been scheduled.

## 2013-12-25 NOTE — Telephone Encounter (Signed)
Plain films were negative for fractures, but did note arthritic and degenerative changes with bone spurring . Orthopeidcs referral is in process

## 2014-01-12 ENCOUNTER — Other Ambulatory Visit: Payer: Self-pay | Admitting: Internal Medicine

## 2014-01-14 ENCOUNTER — Ambulatory Visit: Payer: BC Managed Care – PPO | Admitting: Family Medicine

## 2014-01-17 ENCOUNTER — Encounter: Payer: Self-pay | Admitting: Internal Medicine

## 2014-02-17 ENCOUNTER — Other Ambulatory Visit: Payer: Self-pay | Admitting: *Deleted

## 2014-02-17 ENCOUNTER — Other Ambulatory Visit: Payer: Self-pay | Admitting: Internal Medicine

## 2014-02-17 MED ORDER — CITALOPRAM HYDROBROMIDE 20 MG PO TABS: 20.0000 mg | ORAL_TABLET | Freq: Every day | ORAL | Status: DC

## 2014-02-27 ENCOUNTER — Other Ambulatory Visit: Payer: Self-pay | Admitting: Internal Medicine

## 2014-02-27 ENCOUNTER — Other Ambulatory Visit: Payer: Self-pay | Admitting: *Deleted

## 2014-02-27 MED ORDER — FAMOTIDINE 20 MG PO TABS: ORAL_TABLET | ORAL | Status: DC

## 2014-02-27 NOTE — Telephone Encounter (Signed)
Last visit 12/23/13, ok refill? 

## 2014-02-28 NOTE — Telephone Encounter (Signed)
Ok to refill,  Refill sent  

## 2014-03-13 ENCOUNTER — Ambulatory Visit: Payer: Self-pay | Admitting: Orthopedic Surgery

## 2014-04-02 ENCOUNTER — Encounter: Payer: Self-pay | Admitting: Internal Medicine

## 2014-04-25 HISTORY — PX: KNEE SURGERY: SHX244

## 2014-05-23 ENCOUNTER — Telehealth: Payer: Self-pay | Admitting: *Deleted

## 2014-05-23 MED ORDER — CITALOPRAM HYDROBROMIDE 20 MG PO TABS: 20.0000 mg | ORAL_TABLET | Freq: Every day | ORAL | Status: DC

## 2014-05-23 NOTE — Telephone Encounter (Signed)
Fax from pharmacy requesting Citalopram HBR 20 mg, #30 w/2 refills.  Last refill 12.26.15, last OV 8.21.15.  Please advise refill

## 2014-05-23 NOTE — Telephone Encounter (Signed)
Pt scheduled 2.24.16.

## 2014-05-23 NOTE — Telephone Encounter (Signed)
Ok to refill,  Authorized in Dentist.  PLEASE ASK PATIENT TO MAKE APPT SOON

## 2014-06-18 ENCOUNTER — Encounter: Payer: Self-pay | Admitting: Internal Medicine

## 2014-06-18 ENCOUNTER — Ambulatory Visit (INDEPENDENT_AMBULATORY_CARE_PROVIDER_SITE_OTHER): Payer: 59 | Admitting: Internal Medicine

## 2014-06-18 VITALS — BP 128/80 | HR 82 | Temp 97.4°F | Resp 14 | Ht 66.0 in | Wt 177.5 lb

## 2014-06-18 DIAGNOSIS — Z23 Encounter for immunization: Secondary | ICD-10-CM

## 2014-06-18 DIAGNOSIS — R5383 Other fatigue: Secondary | ICD-10-CM

## 2014-06-18 DIAGNOSIS — K2100 Gastro-esophageal reflux disease with esophagitis, without bleeding: Secondary | ICD-10-CM

## 2014-06-18 DIAGNOSIS — M25461 Effusion, right knee: Secondary | ICD-10-CM

## 2014-06-18 DIAGNOSIS — J4520 Mild intermittent asthma, uncomplicated: Secondary | ICD-10-CM

## 2014-06-18 DIAGNOSIS — Z0000 Encounter for general adult medical examination without abnormal findings: Secondary | ICD-10-CM

## 2014-06-18 LAB — CBC WITH DIFFERENTIAL/PLATELET
BASOS ABS: 0 10*3/uL (ref 0.0–0.1)
Basophils Relative: 0.7 % (ref 0.0–3.0)
Eosinophils Absolute: 0.1 10*3/uL (ref 0.0–0.7)
Eosinophils Relative: 2.3 % (ref 0.0–5.0)
HCT: 38.7 % (ref 36.0–46.0)
Hemoglobin: 13.1 g/dL (ref 12.0–15.0)
Lymphocytes Relative: 32.7 % (ref 12.0–46.0)
Lymphs Abs: 1.7 10*3/uL (ref 0.7–4.0)
MCHC: 33.7 g/dL (ref 30.0–36.0)
MCV: 92.8 fl (ref 78.0–100.0)
MONO ABS: 0.4 10*3/uL (ref 0.1–1.0)
Monocytes Relative: 7.2 % (ref 3.0–12.0)
Neutro Abs: 3 10*3/uL (ref 1.4–7.7)
Neutrophils Relative %: 57.1 % (ref 43.0–77.0)
Platelets: 205 10*3/uL (ref 150.0–400.0)
RBC: 4.17 Mil/uL (ref 3.87–5.11)
RDW: 13.7 % (ref 11.5–15.5)
WBC: 5.3 10*3/uL (ref 4.0–10.5)

## 2014-06-18 LAB — COMPREHENSIVE METABOLIC PANEL
ALK PHOS: 82 U/L (ref 39–117)
ALT: 14 U/L (ref 0–35)
AST: 13 U/L (ref 0–37)
Albumin: 4.1 g/dL (ref 3.5–5.2)
BUN: 17 mg/dL (ref 6–23)
CHLORIDE: 105 meq/L (ref 96–112)
CO2: 29 mEq/L (ref 19–32)
Calcium: 9 mg/dL (ref 8.4–10.5)
Creatinine, Ser: 0.77 mg/dL (ref 0.40–1.20)
GFR: 79.98 mL/min (ref >60.00)
GLUCOSE: 97 mg/dL (ref 70–99)
Potassium: 4.5 mEq/L (ref 3.5–5.1)
Sodium: 139 mEq/L (ref 135–145)
TOTAL PROTEIN: 6.6 g/dL (ref 6.0–8.3)
Total Bilirubin: 0.5 mg/dL (ref 0.2–1.2)

## 2014-06-18 MED ORDER — PANTOPRAZOLE SODIUM 40 MG PO TBEC: DELAYED_RELEASE_TABLET | ORAL | Status: DC

## 2014-06-18 NOTE — Patient Instructions (Signed)
You had your annual  wellness exam today.   We will schedule your mammogram  In March and your colonoscopy after that  .   You received the TDaP vaccine today.  If you develop a sore arm , use tylenol and ibuprofen for a few days   Please make an appt for fasting labs at your earliest convenience after March 1  We will contact you with the bloodwork results.  Tell Dr Rudene Christians we checked a CMET and CBC today   Health Maintenance Adopting a healthy lifestyle and getting preventive care can go a long way to promote health and wellness. Talk with your health care provider about what schedule of regular examinations is right for you. This is a good chance for you to check in with your provider about disease prevention and staying healthy. In between checkups, there are plenty of things you can do on your own. Experts have done a lot of research about which lifestyle changes and preventive measures are most likely to keep you healthy. Ask your health care provider for more information. WEIGHT AND DIET  Eat a healthy diet  Be sure to include plenty of vegetables, fruits, low-fat dairy products, and lean protein.  Do not eat a lot of foods high in solid fats, added sugars, or salt.  Get regular exercise. This is one of the most important things you can do for your health.  Most adults should exercise for at least 150 minutes each week. The exercise should increase your heart rate and make you sweat (moderate-intensity exercise).  Most adults should also do strengthening exercises at least twice a week. This is in addition to the moderate-intensity exercise.  Maintain a healthy weight  Body mass index (BMI) is a measurement that can be used to identify possible weight problems. It estimates body fat based on height and weight. Your health care provider can help determine your BMI and help you achieve or maintain a healthy weight.  For females 10 years of age and older:   A BMI below 18.5 is  considered underweight.  A BMI of 18.5 to 24.9 is normal.  A BMI of 25 to 29.9 is considered overweight.  A BMI of 30 and above is considered obese.  Watch levels of cholesterol and blood lipids  You should start having your blood tested for lipids and cholesterol at 65 years of age, then have this test every 5 years.  You may need to have your cholesterol levels checked more often if:  Your lipid or cholesterol levels are high.  You are older than 65 years of age.  You are at high risk for heart disease.  CANCER SCREENING   Lung Cancer  Lung cancer screening is recommended for adults 74-38 years old who are at high risk for lung cancer because of a history of smoking.  A yearly low-dose CT scan of the lungs is recommended for people who:  Currently smoke.  Have quit within the past 15 years.  Have at least a 30-pack-year history of smoking. A pack year is smoking an average of one pack of cigarettes a day for 1 year.  Yearly screening should continue until it has been 15 years since you quit.  Yearly screening should stop if you develop a health problem that would prevent you from having lung cancer treatment.  Breast Cancer  Practice breast self-awareness. This means understanding how your breasts normally appear and feel.  It also means doing regular breast self-exams. Let your  health care provider know about any changes, no matter how small.  If you are in your 20s or 30s, you should have a clinical breast exam (CBE) by a health care provider every 1-3 years as part of a regular health exam.  If you are 31 or older, have a CBE every year. Also consider having a breast X-ray (mammogram) every year.  If you have a family history of breast cancer, talk to your health care provider about genetic screening.  If you are at high risk for breast cancer, talk to your health care provider about having an MRI and a mammogram every year.  Breast cancer gene (BRCA)  assessment is recommended for women who have family members with BRCA-related cancers. BRCA-related cancers include:  Breast.  Ovarian.  Tubal.  Peritoneal cancers.  Results of the assessment will determine the need for genetic counseling and BRCA1 and BRCA2 testing. Cervical Cancer Routine pelvic examinations to screen for cervical cancer are no longer recommended for nonpregnant women who are considered low risk for cancer of the pelvic organs (ovaries, uterus, and vagina) and who do not have symptoms. A pelvic examination may be necessary if you have symptoms including those associated with pelvic infections. Ask your health care provider if a screening pelvic exam is right for you.   The Pap test is the screening test for cervical cancer for women who are considered at risk.  If you had a hysterectomy for a problem that was not cancer or a condition that could lead to cancer, then you no longer need Pap tests.  If you are older than 65 years, and you have had normal Pap tests for the past 10 years, you no longer need to have Pap tests.  If you have had past treatment for cervical cancer or a condition that could lead to cancer, you need Pap tests and screening for cancer for at least 20 years after your treatment.  If you no longer get a Pap test, assess your risk factors if they change (such as having a new sexual partner). This can affect whether you should start being screened again.  Some women have medical problems that increase their chance of getting cervical cancer. If this is the case for you, your health care provider may recommend more frequent screening and Pap tests.  The human papillomavirus (HPV) test is another test that may be used for cervical cancer screening. The HPV test looks for the virus that can cause cell changes in the cervix. The cells collected during the Pap test can be tested for HPV.  The HPV test can be used to screen women 30 years of age and older.  Getting tested for HPV can extend the interval between normal Pap tests from three to five years.  An HPV test also should be used to screen women of any age who have unclear Pap test results.  After 65 years of age, women should have HPV testing as often as Pap tests.  Colorectal Cancer  This type of cancer can be detected and often prevented.  Routine colorectal cancer screening usually begins at 65 years of age and continues through 65 years of age.  Your health care provider may recommend screening at an earlier age if you have risk factors for colon cancer.  Your health care provider may also recommend using home test kits to check for hidden blood in the stool.  A small camera at the end of a tube can be used to examine  your colon directly (sigmoidoscopy or colonoscopy). This is done to check for the earliest forms of colorectal cancer.  Routine screening usually begins at age 72.  Direct examination of the colon should be repeated every 5-10 years through 65 years of age. However, you may need to be screened more often if early forms of precancerous polyps or small growths are found. Skin Cancer  Check your skin from head to toe regularly.  Tell your health care provider about any new moles or changes in moles, especially if there is a change in a mole's shape or color.  Also tell your health care provider if you have a mole that is larger than the size of a pencil eraser.  Always use sunscreen. Apply sunscreen liberally and repeatedly throughout the day.  Protect yourself by wearing long sleeves, pants, a wide-brimmed hat, and sunglasses whenever you are outside. HEART DISEASE, DIABETES, AND HIGH BLOOD PRESSURE   Have your blood pressure checked at least every 1-2 years. High blood pressure causes heart disease and increases the risk of stroke.  If you are between 43 years and 83 years old, ask your health care provider if you should take aspirin to prevent  strokes.  Have regular diabetes screenings. This involves taking a blood sample to check your fasting blood sugar level.  If you are at a normal weight and have a low risk for diabetes, have this test once every three years after 65 years of age.  If you are overweight and have a high risk for diabetes, consider being tested at a younger age or more often. PREVENTING INFECTION  Hepatitis B  If you have a higher risk for hepatitis B, you should be screened for this virus. You are considered at high risk for hepatitis B if:  You were born in a country where hepatitis B is common. Ask your health care provider which countries are considered high risk.  Your parents were born in a high-risk country, and you have not been immunized against hepatitis B (hepatitis B vaccine).  You have HIV or AIDS.  You use needles to inject street drugs.  You live with someone who has hepatitis B.  You have had sex with someone who has hepatitis B.  You get hemodialysis treatment.  You take certain medicines for conditions, including cancer, organ transplantation, and autoimmune conditions. Hepatitis C  Blood testing is recommended for:  Everyone born from [birth year] through 1966.  Anyone with known risk factors for hepatitis C. Sexually transmitted infections (STIs)  You should be screened for sexually transmitted infections (STIs) including gonorrhea and chlamydia if:  You are sexually active and are younger than 65 years of age.  You are older than 65 years of age and your health care provider tells you that you are at risk for this type of infection.  Your sexual activity has changed since you were last screened and you are at an increased risk for chlamydia or gonorrhea. Ask your health care provider if you are at risk.  If you do not have HIV, but are at risk, it may be recommended that you take a prescription medicine daily to prevent HIV infection. This is called pre-exposure prophylaxis  (PrEP). You are considered at risk if:  You are sexually active and do not regularly use condoms or know the HIV status of your partner(s).  You take drugs by injection.  You are sexually active with a partner who has HIV. Talk with your health care provider about whether  you are at high risk of being infected with HIV. If you choose to begin PrEP, you should first be tested for HIV. You should then be tested every 3 months for as long as you are taking PrEP.  PREGNANCY   If you are premenopausal and you may become pregnant, ask your health care provider about preconception counseling.  If you may become pregnant, take 400 to 800 micrograms (mcg) of folic acid every day.  If you want to prevent pregnancy, talk to your health care provider about birth control (contraception). OSTEOPOROSIS AND MENOPAUSE   Osteoporosis is a disease in which the bones lose minerals and strength with aging. This can result in serious bone fractures. Your risk for osteoporosis can be identified using a bone density scan.  If you are 54 years of age or older, or if you are at risk for osteoporosis and fractures, ask your health care provider if you should be screened.  Ask your health care provider whether you should take a calcium or vitamin D supplement to lower your risk for osteoporosis.  Menopause may have certain physical symptoms and risks.  Hormone replacement therapy may reduce some of these symptoms and risks. Talk to your health care provider about whether hormone replacement therapy is right for you.  HOME CARE INSTRUCTIONS   Schedule regular health, dental, and eye exams.  Stay current with your immunizations.   Do not use any tobacco products including cigarettes, chewing tobacco, or electronic cigarettes.  If you are pregnant, do not drink alcohol.  If you are breastfeeding, limit how much and how often you drink alcohol.  Limit alcohol intake to no more than 1 drink per day for  nonpregnant women. One drink equals 12 ounces of beer, 5 ounces of wine, or 1 ounces of hard liquor.  Do not use street drugs.  Do not share needles.  Ask your health care provider for help if you need support or information about quitting drugs.  Tell your health care provider if you often feel depressed.  Tell your health care provider if you have ever been abused or do not feel safe at home. Document Released: 10/25/2010 Document Revised: 08/26/2013 Document Reviewed: 03/13/2013 [facility] Patient Information 2015 [place], [place]. This information is not intended to replace advice given to you by your health care provider. Make sure you discuss any questions you have with your health care provider.

## 2014-06-18 NOTE — Assessment & Plan Note (Signed)
Currently asymptomatic, on no meds,  Has not had to use albuterol in 6 months

## 2014-06-18 NOTE — Assessment & Plan Note (Signed)
Managed with protonix in the am and pepcid before dinner

## 2014-06-18 NOTE — Progress Notes (Signed)
Patient ID: MYISHIA KASIK, female   DOB: December 10, 1950, 64 y.o.   MRN: [id number]   Subjective:     Sarah [name] is a 64 y.o. female and is here for a comprehensive physical exam. The patient reports no problems.  History   Social History  . Marital Status: Single    Spouse Name: N/A  . Number of Children: N/A  . Years of Education: N/A   Occupational History  . Not on file.   Social History Main Topics  . Smoking status: Never Smoker   . Smokeless tobacco: Never Used  . Alcohol Use: No  . Drug Use: No  . Sexual Activity: No   Other Topics Concern  . Not on file   Social History Narrative   Health Maintenance  Topic Date Due  . HIV Screening  07/09/1964  . COLONOSCOPY  07/10/1999  . ZOSTAVAX  07/09/2009  . MAMMOGRAM  04/23/2011  . INFLUENZA VACCINE  11/23/2013  . TETANUS/TDAP  06/18/2024    The following portions of the patient's history were reviewed and updated as appropriate: allergies, current medications, past family history, past medical history, past social history, past surgical history and problem list.  Review of Systems  Patient denies headache, fevers, malaise, unintentional weight loss, skin rash, eye pain, sinus congestion and sinus pain, sore throat, dysphagia,  hemoptysis , cough, dyspnea, wheezing, chest pain, palpitations, orthopnea, edema, abdominal pain, nausea, melena, diarrhea, constipation, flank pain, dysuria, hematuria, urinary  Frequency, nocturia, numbness, tingling, seizures,  Focal weakness, Loss of consciousness,  Tremor, insomnia, depression, anxiety, and suicidal ideation.      Objective:   BP 128/80 mmHg  Pulse 82  Temp(Src) 97.4 F (36.3 C) (Oral)  Resp 14  Ht _0  (1.676 m)  Wt 177 lb 8 oz (80.513 kg)  BMI 28.66 kg/m2  SpO2 95%  General appearance: alert, cooperative and appears stated age Head: Normocephalic, without obvious abnormality, atraumatic Eyes: conjunctivae/corneas clear. PERRL, EOM's intact. Fundi  benign. Ears: normal TM's and external ear canals both ears Nose: Nares normal. Septum midline. Mucosa normal. No drainage or sinus tenderness. Throat: lips, mucosa, and tongue normal; teeth and gums normal Neck: no adenopathy, no carotid bruit, no JVD, supple, symmetrical, trachea midline and thyroid not enlarged, symmetric, no tenderness/mass/nodules Lungs: clear to auscultation bilaterally Breasts: normal appearance, no masses or tenderness Heart: regular rate and rhythm, S1, S2 normal, no murmur, click, rub or gallop Abdomen: soft, non-tender; bowel sounds normal; no masses,  no organomegaly Extremities: extremities normal, atraumatic, no cyanosis or edema Pulses: 2+ and symmetric Skin: Skin color, texture, turgor normal. No rashes or lesions Neurologic: Alert and oriented X 3, normal strength and tone. Normal symmetric reflexes. Normal coordination and gait.    Assessment and Plan:    Problem List Items Addressed This Visit    Routine general medical examination at a health care facility    Annual wellness  exam was done as well as a comprehensive physical exam and management of acute and chronic conditions .  During the course of the visit the patient was educated and counseled about appropriate screening and preventive services including :  diabetes screening, lipid analysis with projected  10 year  risk for CAD , nutrition counseling, colorectal cancer screening, and recommended immunizations.  Printed recommendations for health maintenance screenings was given.        Esophagitis, reflux - Primary    Managed with protonix in the am and pepcid before dinner  Effusion of right knee    Secondary to meniscal tear.  For arthroscopic surgery y Dr Rudene Christians      Asthma    Currently asymptomatic, on no meds,  Has not had to use albuterol in 6 months        Other Visit Diagnoses    Other fatigue        Relevant Orders    Comp Met (CMET) (Completed)    CBC with  Differential/Platelet (Completed)    Need for diphtheria-tetanus-pertussis (Tdap) vaccine, adult/adolescent        Relevant Orders    Tdap vaccine greater than or equal to 7yo IM (Completed)

## 2014-06-18 NOTE — Progress Notes (Signed)
Pre-visit discussion using our clinic review tool. No additional management support is needed unless otherwise documented below in the visit note.  

## 2014-06-19 NOTE — Assessment & Plan Note (Signed)

## 2014-06-19 NOTE — Assessment & Plan Note (Signed)
Secondary to meniscal tear.  For arthroscopic surgery y Dr Rudene Christians

## 2014-06-20 ENCOUNTER — Encounter: Payer: Self-pay | Admitting: *Deleted

## 2014-07-10 ENCOUNTER — Ambulatory Visit: Payer: Self-pay | Admitting: Orthopedic Surgery

## 2014-07-30 ENCOUNTER — Telehealth: Payer: Self-pay | Admitting: *Deleted

## 2014-07-30 ENCOUNTER — Other Ambulatory Visit (INDEPENDENT_AMBULATORY_CARE_PROVIDER_SITE_OTHER): Payer: Medicare Other

## 2014-07-30 DIAGNOSIS — E785 Hyperlipidemia, unspecified: Secondary | ICD-10-CM | POA: Diagnosis not present

## 2014-07-30 LAB — LIPID PANEL
CHOL/HDL RATIO: 4
CHOLESTEROL: 238 mg/dL — AB (ref 0–200)
HDL: 55.6 mg/dL (ref >39.00)
LDL CALC: 167 mg/dL — AB (ref 0–99)
NONHDL: 182.4
Triglycerides: 79 mg/dL (ref 0.0–149.0)
VLDL: 15.8 mg/dL (ref 0.0–40.0)

## 2014-07-30 NOTE — Telephone Encounter (Signed)
Labs and dx?  

## 2014-07-31 NOTE — Telephone Encounter (Signed)
error 

## 2014-08-04 ENCOUNTER — Encounter: Payer: Self-pay | Admitting: *Deleted

## 2014-08-07 ENCOUNTER — Ambulatory Visit
Admit: 2014-08-07 | Disposition: A | Discharge status: Home / Self Care | Payer: Self-pay | Attending: Internal Medicine | Admitting: Internal Medicine

## 2014-08-07 LAB — HM MAMMOGRAPHY: HM Mammogram: NEGATIVE

## 2014-08-15 ENCOUNTER — Encounter: Payer: Self-pay | Admitting: Internal Medicine

## 2014-08-15 ENCOUNTER — Ambulatory Visit (INDEPENDENT_AMBULATORY_CARE_PROVIDER_SITE_OTHER): Payer: Medicare Other | Admitting: Internal Medicine

## 2014-08-15 VITALS — BP 130/72 | HR 80 | Temp 98.0°F | Resp 16 | Ht 66.0 in | Wt 173.2 lb

## 2014-08-15 DIAGNOSIS — E785 Hyperlipidemia, unspecified: Secondary | ICD-10-CM | POA: Diagnosis not present

## 2014-08-15 DIAGNOSIS — Z1211 Encounter for screening for malignant neoplasm of colon: Secondary | ICD-10-CM

## 2014-08-15 DIAGNOSIS — R030 Elevated blood-pressure reading, without diagnosis of hypertension: Secondary | ICD-10-CM | POA: Diagnosis not present

## 2014-08-15 MED ORDER — RED YEAST RICE EXTRACT 600 MG PO CAPS: 1.0000 | ORAL_CAPSULE | Freq: Two times a day (BID) | ORAL | Status: DC

## 2014-08-15 NOTE — Patient Instructions (Addendum)
I want you to consider taking  Red Yeast Rice   For your cholesterol, because your LDL is high   It is available in a capsule form and is taken TWICE DAILY    I will get you set up for a colonoscopy for your colon ca screening  Please return in 6 months after repeating your fasting labs   YOUR MAMMOGRAM WAS NORMAL!  IT WAS DONE APIRL 16

## 2014-08-15 NOTE — Progress Notes (Signed)
Pre-visit discussion using our clinic review tool. No additional management support is needed unless otherwise documented below in the visit note.  

## 2014-08-17 DIAGNOSIS — E785 Hyperlipidemia, unspecified: Secondary | ICD-10-CM | POA: Insufficient documentation

## 2014-08-17 NOTE — Assessment & Plan Note (Signed)
Based on current lipid profile, the risk of clinically significant CAD is 22% over the next 10 years, using the Framingham risk calculator. The [employer] recommends starting patients aged 65 or higher on moderate intensity statin therapy for LDL between 70-189 and 10 yr risk of CAD > 7.5% ;  and high intensity therapy for anyone with LDL > 190.   Lab Results  Component Value Date   CHOL 238* 07/30/2014   HDL 55.60 07/30/2014   LDLCALC 167* 07/30/2014   TRIG 79.0 07/30/2014   CHOLHDL 4 07/30/2014   She is reluctant to start staitn therapy; recommended trial of red yeast rice , exercise, low GI diet, weight loss and repeat fasting labs in 6 months.

## 2014-08-17 NOTE — Assessment & Plan Note (Signed)
BP 130/72 mmHg  Pulse 80  Temp(Src) 98 F (36.7 C) (Oral)  Resp 16  Ht 5\' 6"  (1.676 m)  Wt 173 lb 4 oz (78.586 kg)  BMI 27.98 kg/m2  SpO2 96%  Repeat measurements have been normal.

## 2014-08-17 NOTE — Progress Notes (Signed)
Patient ID: Sarah [name], female   DOB: April 16, 1951, 64 y.o.   MRN: [id number]  Patient Active Problem List   Diagnosis Date Noted  . Hyperlipidemia LDL goal <130 08/17/2014  . Effusion of right knee 12/23/2013  . Elevated blood-pressure reading without diagnosis of hypertension 11/24/2013  . Esophagitis, reflux 11/24/2013  . S/P hysterectomy 04/28/2013  . Routine general medical examination at a health care facility 04/28/2013  . Asthma   . Depression     Subjective:  CC:   Chief Complaint  Patient presents with  . Follow-up    lab results    HPI:   Sarah [name] is a 64 y.o. female who presents for   Follow up on recent labs done after her annual exam.  She has been watchign her diet and going for a walk up to 3 times per week.  Has lost 4 lbs.  No asthma exacerbations.    Past Medical History  Diagnosis Date  . Asthma   . Depression     No past surgical history on file.     The following portions of the patient's history were reviewed and updated as appropriate: Allergies, current medications, and problem list.    Review of Systems:   Patient denies headache, fevers, malaise, unintentional weight loss, skin rash, eye pain, sinus congestion and sinus pain, sore throat, dysphagia,  hemoptysis , cough, dyspnea, wheezing, chest pain, palpitations, orthopnea, edema, abdominal pain, nausea, melena, diarrhea, constipation, flank pain, dysuria, hematuria, urinary  Frequency, nocturia, numbness, tingling, seizures,  Focal weakness, Loss of consciousness,  Tremor, insomnia, depression, anxiety, and suicidal ideation.     History   Social History  . Marital Status: Single    Spouse Name: N/A  . Number of Children: N/A  . Years of Education: N/A   Occupational History  . Not on file.   Social History Main Topics  . Smoking status: Never Smoker   . Smokeless tobacco: Never Used  . Alcohol Use: No  . Drug Use: No  . Sexual Activity: No   Other Topics  Concern  . Not on file   Social History Narrative    Objective:  Filed Vitals:   08/15/14 1608  BP: 130/72  Pulse: 80  Temp: 98 F (36.7 C)  Resp: 16     General appearance: alert, cooperative and appears stated age Ears: normal TM's and external ear canals both ears Throat: lips, mucosa, and tongue normal; teeth and gums normal Neck: no adenopathy, no carotid bruit, supple, symmetrical, trachea midline and thyroid not enlarged, symmetric, no tenderness/mass/nodules Back: symmetric, no curvature. ROM normal. No CVA tenderness. Lungs: clear to auscultation bilaterally Heart: regular rate and rhythm, S1, S2 normal, no murmur, click, rub or gallop Abdomen: soft, non-tender; bowel sounds normal; no masses,  no organomegaly Pulses: 2+ and symmetric Skin: Skin color, texture, turgor normal. No rashes or lesions Lymph nodes: Cervical, supraclavicular, and axillary nodes normal.  Assessment and Plan:  Hyperlipidemia LDL goal <130 Based on current lipid profile, the risk of clinically significant CAD is 22% over the next 10 years, using the Framingham risk calculator. The [employer] recommends starting patients aged 66 or higher on moderate intensity statin therapy for LDL between 70-189 and 10 yr risk of CAD > 7.5% ;  and high intensity therapy for anyone with LDL > 190.   Lab Results  Component Value Date   CHOL 238* 07/30/2014   HDL 55.60 07/30/2014   LDLCALC 167* 07/30/2014  TRIG 79.0 07/30/2014   CHOLHDL 4 07/30/2014   She is reluctant to start staitn therapy; recommended trial of red yeast rice , exercise, low GI diet, weight loss and repeat fasting labs in 6 months.     Elevated blood-pressure reading without diagnosis of hypertension BP 130/72 mmHg  Pulse 80  Temp(Src) 98 F (36.7 C) (Oral)  Resp 16  Ht $R'5\' 6"'Nm$  (1.676 m)  Wt 173 lb 4 oz (78.586 kg)  BMI 27.98 kg/m2  SpO2 96%  Repeat measurements have been normal.   A total of  25 minutes  of face to face time was spent with patient more than half of which was spent in counselling and coordination of care   Updated Medication List Outpatient Encounter Prescriptions as of 08/15/2014  Medication Sig  . albuterol (PROVENTIL HFA;VENTOLIN HFA) 108 (90 BASE) MCG/ACT inhaler 2 puffs as needed  . calcium carbonate (OS-CAL) 600 MG TABS tablet Take 600 mg by mouth 2 (two) times daily with a meal.  . citalopram (CELEXA) 20 MG tablet Take 1 tablet (20 mg total) by mouth daily.  . famotidine (PEPCID) 20 MG tablet TAKE 1 TABLET BY MOUTH DAILY. BEFORE DINNER  . Green Tea, Camillia sinensis, ([employer] GREEN TEA EXTRACT PO) Take 1 tablet by mouth 2 (two) times daily.  Marland Kitchen ibuprofen (ADVIL,MOTRIN) 200 MG tablet Take 200 mg by mouth every 8 (eight) hours as needed.  . Omega-3 Fatty Acids (FISH OIL) 1200 MG CAPS Take 1 capsule by mouth daily.  Marland Kitchen omeprazole (PRILOSEC) 20 MG capsule Take 20 mg by mouth 2 (two) times daily before a meal.  . OVER THE COUNTER MEDICATION Take 25 mg by mouth daily. Equate allergy relief  . pantoprazole (PROTONIX) 40 MG tablet TAKE 1 TABLET BY MOUTH DAILY. IN AM 30 MINUTES BEFORE EATING  . Red Yeast Rice Extract 600 MG CAPS Take 1 capsule (600 mg total) by mouth 2 (two) times daily.  . [DISCONTINUED] Diclofenac Sodium CR 100 MG 24 hr tablet TAKE 1 TABLET BY MOUTH EVERY DAY (Patient not taking: Reported on 06/18/2014)  . [DISCONTINUED] HYDROcodone-acetaminophen (NORCO/VICODIN) 5-325 MG per tablet Take 1 tablet by mouth every 6 (six) hours as needed for moderate pain. (Patient not taking: Reported on 06/18/2014)     Orders Placed This Encounter  Procedures  . Lipid panel  . Comp Met (CMET)  . Ambulatory referral to General Surgery    Return in about 6 months (around 02/14/2015).

## 2014-08-24 NOTE — Op Note (Signed)
PATIENT NAME:  Sarah [name], Sarah [name] MR#:  [id number] DATE OF BIRTH:  Jun 04, 1950  DATE OF PROCEDURE:  07/10/2014  PREOPERATIVE DIAGNOSIS:  Medial meniscus tear, right knee.   POSTOPERATIVE DIAGNOSIS:  Medial meniscus tear, right knee.   PROCEDURE:  Right knee arthroscopy, partial medial meniscectomy.   ANESTHESIA:  General.   SURGEON:  Laurene Footman, MD   DESCRIPTION OF PROCEDURE:  The patient was brought to the operating room, and after adequate anesthesia was obtained, the right leg was prepped and draped in the usual sterile fashion with a tourniquet and arthroscopic leg holder applied to the upper thigh. After patient identification and timeout procedures were completed, inferolateral portal was made and the arthroscope was introduced showing mild patellofemoral chondromalacia, but normal tracking. Coming around medially, an inferomedial portal was made, and on probing, there was grade 3 changes to the femoral condyle and grade 2 changes to the tibia. There was a tear of the posterior horn, which appeared to be an old bucket handle tear that had split in the middle with flaps towards the notch and medially; this was debrided with the use of a meniscal punch, resecting most of the posterior horn, followed by a shaver to get these large fragments and remove any displaceable flaps of these tears. An ArthroCare wand was then used to smooth out the edges. The ACL was intact. The lateral compartment was normal with normal-appearing articular cartilage and meniscus. The gutters were free of any loose bodies. After thorough irrigation of the knee, all instrumentation was withdrawn. Tourniquet was started after the start of the case to minimize bleeding with a tourniquet time of 20 minutes at 300 mmHg. Pre- and postprocedure pictures obtained.   ESTIMATED BLOOD LOSS:  Minimal.  COMPLICATIONS:  None.   SPECIMEN:  None.    ____________________________ Laurene Footman, MD mjm:nb D: 07/10/2014 20:25:48  ET T: 07/10/2014 22:22:03 ET JOB#: [id number]  cc: Laurene Footman, MD, <Dictator> Laurene Footman MD ELECTRONICALLY SIGNED 07/11/2014 8:54

## 2014-08-28 ENCOUNTER — Encounter: Payer: Self-pay | Admitting: General Surgery

## 2014-08-31 ENCOUNTER — Other Ambulatory Visit: Payer: Self-pay | Admitting: Internal Medicine

## 2014-09-01 ENCOUNTER — Ambulatory Visit: Payer: Medicare Other

## 2014-09-01 VITALS — BP 134/80 | HR 74 | Temp 97.9°F | Resp 12 | Ht 66.0 in | Wt 176.8 lb

## 2014-09-01 DIAGNOSIS — Z0000 Encounter for general adult medical examination without abnormal findings: Secondary | ICD-10-CM

## 2014-09-01 DIAGNOSIS — Z780 Asymptomatic menopausal state: Secondary | ICD-10-CM

## 2014-09-01 DIAGNOSIS — Z23 Encounter for immunization: Secondary | ICD-10-CM

## 2014-09-01 NOTE — Progress Notes (Unsigned)
Subjective:   Sarah [name] is a 64 y.o. female who presents for an Initial Medicare Annual Wellness Visit.  Review of Systems      Cardiac Risk Factors include: dyslipidemia     Objective:    Today's Vitals   09/01/14 1321  BP: 134/80  Pulse: 74  Temp: 97.9 F (36.6 C)  TempSrc: Oral  Resp: 12  Height: 5\' 6"  (1.676 m)  Weight: 176 lb 12 oz (80.173 kg)  SpO2: 96%    Current Medications (verified) Outpatient Encounter Prescriptions as of 09/01/2014  Medication Sig  . albuterol (PROVENTIL HFA;VENTOLIN HFA) 108 (90 BASE) MCG/ACT inhaler 2 puffs as needed  . calcium carbonate (OS-CAL) 600 MG TABS tablet Take 600 mg by mouth 2 (two) times daily with a meal.  . citalopram (CELEXA) 20 MG tablet TAKE 1 TABLET BY MOUTH EVERY DAY  . famotidine (PEPCID) 20 MG tablet TAKE 1 TABLET BY MOUTH DAILY. BEFORE DINNER  . Green Tea, Camillia sinensis, ([employer] GREEN TEA EXTRACT PO) Take 1 tablet by mouth 2 (two) times daily.  Marland Kitchen ibuprofen (ADVIL,MOTRIN) 200 MG tablet Take 200 mg by mouth every 8 (eight) hours as needed.  . Omega-3 Fatty Acids (FISH OIL) 1200 MG CAPS Take 1 capsule by mouth daily.  Marland Kitchen OVER THE COUNTER MEDICATION Take 25 mg by mouth daily. Equate allergy relief  . pantoprazole (PROTONIX) 40 MG tablet TAKE 1 TABLET BY MOUTH DAILY. IN AM 30 MINUTES BEFORE EATING  . Red Yeast Rice Extract 600 MG CAPS Take 1 capsule (600 mg total) by mouth 2 (two) times daily.  . [DISCONTINUED] omeprazole (PRILOSEC) 20 MG capsule Take 20 mg by mouth 2 (two) times daily before a meal.   No facility-administered encounter medications on file as of 09/01/2014.    Allergies (verified) Review of patient's allergies indicates no known allergies.   History: Past Medical History  Diagnosis Date  . Asthma   . Depression    No past surgical history on file. Family History  Problem Relation Age of Onset  . Diabetes Mother   . Hyperlipidemia Mother   . Heart disease Mother    Social History    Occupational History  . Not on file.   Social History Main Topics  . Smoking status: Never Smoker   . Smokeless tobacco: Never Used  . Alcohol Use: No  . Drug Use: No  . Sexual Activity: No    Tobacco Counseling Counseling given: Not Answered   Activities of Daily Living In your present state of health, do you have any difficulty performing the following activities: 09/01/2014  Hearing? N  Vision? Y  Difficulty concentrating or making decisions? N  Walking or climbing stairs? N  Dressing or bathing? N  Doing errands, shopping? N  Preparing Food and eating ? N  Using the Toilet? N  In the past six months, have you accidently leaked urine? Y  Do you have problems with loss of bowel control? N  Managing your Medications? N  Managing your Finances? N  Housekeeping or managing your Housekeeping? N    Immunizations and Health Maintenance Immunization History  Administered Date(s) Administered  . Influenza,inj,Quad PF,36+ Mos 04/26/2013  . Pneumococcal Polysaccharide-23 04/26/2013  . Tdap 06/18/2014   Health Maintenance Due  Topic Date Due  . HIV Screening  07/09/1964  . COLONOSCOPY  07/10/1999  . ZOSTAVAX  07/09/2009  . DEXA SCAN  07/10/2014  . PNA vac Low Risk Adult (1 of 2 - PCV13) 07/10/2014  Patient Care Team: Sarah Mc, MD as PCP - General (Internal Medicine)  Indicate any recent Medical Services you may have received from other than [facility] providers in the past year (date may be approximate).     Assessment:   This is a routine wellness examination for Sarah [name].   Hearing/Vision screen  Hearing Screening   125Hz  250Hz  500Hz  1000Hz  2000Hz  4000Hz  8000Hz   Right ear:   Pass Pass Pass Pass   Left ear:   Pass Pass Pass Pass     Visual Acuity Screening   Right eye Left eye Both eyes  Without correction:     With correction: 20/40 20/40 20/40   Comments: Followed by Dr. Leonia [name]. At [facility] appt scheduled 09/16/14   Dietary issues  and exercise activities discussed: Current Exercise Habits:: Home exercise routine, Type of exercise: walking, Time (Minutes): 30, Frequency (Times/Week): 3, Weekly Exercise (Minutes/Week): 90, Intensity: Mild  Goals    . Increase physical activity     Walk for 30 minutes at least 3 times a week.      Depression Screen PHQ 2/9 Scores 09/01/2014 08/15/2014  PHQ - 2 Score 1 0    Fall Risk Fall Risk  09/01/2014 08/15/2014  Falls in the past year? Yes Yes  Number falls in past yr: 1 1  Injury with Fall? Yes Yes  Risk for fall due to : Impaired balance/gait -    Cognitive Function: MMSE - Mini Mental State Exam 09/01/2014  Orientation to time 5  Orientation to Place 5  Registration 3  Attention/ Calculation 5  Recall 3  Language- name 2 objects 2  Language- repeat 1  Language- follow 3 step command 3  Language- read & follow direction 1  Write a sentence 1  Copy design 1  Total score 30    Screening Tests Health Maintenance  Topic Date Due  . HIV Screening  07/09/1964  . COLONOSCOPY  07/10/1999  . ZOSTAVAX  07/09/2009  . DEXA SCAN  07/10/2014  . PNA vac Low Risk Adult (1 of 2 - PCV13) 07/10/2014  . INFLUENZA VACCINE  11/24/2014  . MAMMOGRAM  08/06/2016  . TETANUS/TDAP  06/18/2024      Plan:     During the course of the visit, Sarah [name] was educated and counseled about the following appropriate screening and preventive services:   Vaccines to include Pneumoccal, Influenza, Hepatitis B, Td, Zostavax, HCV  Electrocardiogram  Cardiovascular disease screening  Colorectal cancer screening  Bone density screening  Diabetes screening  Glaucoma screening  Mammography/PAP  Nutrition counseling  Smoking cessation counseling  Patient Instructions (the written plan) were given to the patient.    Sarah Bers, LPN   05/03/2991

## 2014-09-01 NOTE — Patient Instructions (Addendum)
Sarah [name] , Thank you for taking time to come for your Medicare Wellness Visit. I appreciate your ongoing commitment to your health goals. Please review the following plan we discussed and let me know if I can assist you in the future.   These are the goals we discussed: Goals    . Increase physical activity     Walk for 30 minutes at least 3 times a week.       This is a list of the screening recommended for you and due dates:  Health Maintenance  Topic Date Due  . HIV Screening  07/09/1964  . Colon Cancer Screening  07/10/1999  . Shingles Vaccine  07/09/2009  . DEXA scan (bone density measurement)  07/10/2014  . Pneumonia vaccines (1 of 2 - PCV13) 07/10/2014  . Flu Shot  11/24/2014  . Mammogram  08/06/2016  . Tetanus Vaccine  06/18/2024    Bone Densitometry Bone densitometry is a special X-ray that measures your bone density and can be used to help predict your risk of bone fractures. This test is used to determine bone mineral content and density to diagnose osteoporosis. Osteoporosis is the loss of bone that may cause the bone to become weak. Osteoporosis commonly occurs in women entering menopause. However, it may be found in men and in people with other diseases. PREPARATION FOR TEST No preparation necessary. WHO SHOULD BE TESTED?  All women older than 12.  Postmenopausal women (50 to 65) with risk factors for osteoporosis.  People with a previous fracture caused by normal activities.  People with a small body frame (less than 127 poundsor a body mass index [BMI] of less than 21).  People who have a parent with a hip fracture or history of osteoporosis.  People who smoke.  People who have rheumatoid arthritis.  Anyone who engages in excessive alcohol use (more than 3 drinks most days).  Women who experience early menopause. WHEN SHOULD YOU BE RETESTED? Current guidelines suggest that you should wait at least 2 years before doing a bone density test again if  your first test was normal.Recent studies indicated that women with normal bone density may be able to wait a few years before needing to repeat a bone density test. You should discuss this with your caregiver.  NORMAL FINDINGS   Normal: less than standard deviation below normal (greater than -1).  Osteopenia: 1 to 2.5 standard deviations below normal (-1 to -2.5).  Osteoporosis: greater than 2.5 standard deviations below normal (less than -2.5). Test results are reported as a "T score" and a "Z score."The T score is a number that compares your bone density with the bone density of healthy, young women.The Z score is a number that compares your bone density with the scores of women who are the same age, gender, and race.  Ranges for normal findings may vary among different laboratories and hospitals. You should always check with your doctor after having lab work or other tests done to discuss the meaning of your test results and whether your values are considered within normal limits. MEANING OF TEST  Your caregiver will go over the test results with you and discuss the importance and meaning of your results, as well as treatment options and the need for additional tests if necessary. OBTAINING THE TEST RESULTS It is your responsibility to obtain your test results. Ask the lab or department performing the test when and how you will get your results. Document Released: 05/03/2004 Document Revised: 07/04/2011 Document  Reviewed: 05/26/2010 [employer] Patient Information 2015 [place], [place]. This information is not intended to replace advice given to you by your health care provider. Make sure you discuss any questions you have with your health care provider.

## 2014-09-09 ENCOUNTER — Encounter: Payer: Self-pay | Admitting: General Surgery

## 2014-09-09 ENCOUNTER — Ambulatory Visit (INDEPENDENT_AMBULATORY_CARE_PROVIDER_SITE_OTHER): Payer: Medicare Other | Admitting: General Surgery

## 2014-09-09 ENCOUNTER — Other Ambulatory Visit: Payer: Self-pay | Admitting: General Surgery

## 2014-09-09 VITALS — BP 136/72 | HR 66 | Resp 12 | Ht 62.0 in | Wt 180.0 lb

## 2014-09-09 DIAGNOSIS — K219 Gastro-esophageal reflux disease without esophagitis: Secondary | ICD-10-CM | POA: Insufficient documentation

## 2014-09-09 DIAGNOSIS — Z1211 Encounter for screening for malignant neoplasm of colon: Secondary | ICD-10-CM

## 2014-09-09 DIAGNOSIS — K529 Noninfective gastroenteritis and colitis, unspecified: Secondary | ICD-10-CM | POA: Diagnosis not present

## 2014-09-09 DIAGNOSIS — Z0000 Encounter for general adult medical examination without abnormal findings: Secondary | ICD-10-CM | POA: Insufficient documentation

## 2014-09-09 DIAGNOSIS — R194 Change in bowel habit: Secondary | ICD-10-CM

## 2014-09-09 MED ORDER — POLYETHYLENE GLYCOL 3350 17 GM/SCOOP PO POWD
1.0000 | Freq: Once | ORAL | Status: DC
Start: 2014-09-09 — End: 2014-09-30

## 2014-09-09 NOTE — H&P (Signed)
HPI  Sarah [name] is a 64 y.o. female here today for evaluation for screening colonoscopy. Patient has not had a colonoscopy prior. Patient states in September 2015 she a bad episode with acid reflux with associated shortness of breath and vomiting. This is nowunder control now with Zantac and. Protonix.  Patient states for the better part of 15 years she will have intermittent explosive diarrhea, occasionally resulting in incontinence. She has not clearly identified any pattern to this. No true food triggers.  HPI  Past Medical History   Diagnosis  Date   .  Asthma    .  Depression     Past Surgical History   Procedure  Laterality  Date   .  Abdominal hysterectomy   1989   .  Foot surgery  Left  1985   .  Knee surgery   2016    Family History   Problem  Relation  Age of Onset   .  Diabetes  Mother    .  Hyperlipidemia  Mother    .  Heart disease  Mother     Social History  History   Substance Use Topics   .  Smoking status:  Never Smoker   .  Smokeless tobacco:  Never Used   .  Alcohol Use:  No    No Known Allergies  Current Outpatient Prescriptions   Medication  Sig  Dispense  Refill   .  albuterol (PROVENTIL HFA;VENTOLIN HFA) 108 (90 BASE) MCG/ACT inhaler  2 puffs as needed     .  calcium carbonate (OS-CAL) 600 MG TABS tablet  Take 600 mg by mouth 2 (two) times daily with a meal.     .  citalopram (CELEXA) 20 MG tablet  TAKE 1 TABLET BY MOUTH EVERY DAY  30 tablet  2   .  famotidine (PEPCID) 20 MG tablet  TAKE 1 TABLET BY MOUTH DAILY. BEFORE DINNER  30 tablet  5   .  Green Tea, Camillia sinensis, ([employer] GREEN TEA EXTRACT PO)  Take 1 tablet by mouth 2 (two) times daily.     Marland Kitchen  ibuprofen (ADVIL,MOTRIN) 200 MG tablet  Take 200 mg by mouth every 8 (eight) hours as needed.     .  Omega-3 Fatty Acids (FISH OIL) 1200 MG CAPS  Take 1 capsule by mouth daily.     Marland Kitchen  OVER THE COUNTER MEDICATION  Take 25 mg by mouth daily. Equate allergy relief     .  Red Yeast Rice Extract 600 MG CAPS   Take 1 capsule (600 mg total) by mouth 2 (two) times daily.  180 capsule  1   .  pantoprazole (PROTONIX) 40 MG tablet  TAKE 1 TABLET BY MOUTH DAILY. IN AM 30 MINUTES BEFORE EATING  30 tablet  5   .  polyethylene glycol powder (GLYCOLAX/MIRALAX) powder  Take 255 g by mouth once.  255 g  0    No current facility-administered medications for this visit.    Review of Systems  Review of Systems  Constitutional: Negative.  Respiratory: Negative.  Cardiovascular: Negative.  Gastrointestinal: Negative.   Blood pressure 136/72, pulse 66, resp. rate 12, height 5\' 2"  (1.575 m), weight 180 lb (81.647 kg).  Physical Exam  Physical Exam  Constitutional: She is oriented to person, place, and time. She appears well-developed and well-nourished.  Cardiovascular: Normal rate, regular rhythm and normal heart sounds.  Pulmonary/Chest: Effort normal and breath sounds normal.  Abdominal: Soft. Bowel sounds are  normal. There is no tenderness.  Neurological: She is alert and oriented to person, place, and time.  Skin: Skin is warm and dry.   Data Reviewed  PCP records.  CBC, comp rancid metabolic panel and H. Pylori titer negative February 2016.  Assessment   Candidate for screening colonoscopy. Upper endoscopy recommended based on severe symptoms prior to treatment with H2/PPI therapy.   Plan   The risks and benefits of screening exams were reviewed. The risks associated with colonoscopy including those of bleeding and perforation were discussed.   The patient will be scheduled for a colonoscopy and upper endoscopy.  The patient is scheduled for an upper endoscopy and colonoscopy at [facility] on 09/30/14. She will stop her Fish Oil one week prior. Patient is aware to pre register with the hospital at least 2 days prior. Miralax prescription has been sent into her pharmacy. Patient is aware of date and instructions.  PCP: Mattie Marlin  09/09/2014, 12:02 PM

## 2014-09-09 NOTE — Progress Notes (Signed)
Patient ID: Sarah [name], female   DOB: 1950/05/12, 64 y.o.   MRN: [id number]  Chief Complaint  Patient presents with  . OTHER    screening colon    HPI Sarah [name] is a 64 y.o. female here today for evaluation for screening colonoscopy. Patient has not had a colonoscopy prior. Patient states in September 2015 she a bad episode with acid reflux with associated shortness of breath and vomiting. This is nowunder control now with Zantac and. Protonix.   Patient states for the better part of 15 years she will have intermittent explosive diarrhea, occasionally resulting in incontinence. She has not clearly identified any pattern to this. No true food triggers.  HPI  Past Medical History  Diagnosis Date  . Asthma   . Depression     Past Surgical History  Procedure Laterality Date  . Abdominal hysterectomy  1989  . Foot surgery Left 1985  . Knee surgery  2016    Family History  Problem Relation Age of Onset  . Diabetes Mother   . Hyperlipidemia Mother   . Heart disease Mother     Social History History  Substance Use Topics  . Smoking status: Never Smoker   . Smokeless tobacco: Never Used  . Alcohol Use: No    No Known Allergies  Current Outpatient Prescriptions  Medication Sig Dispense Refill  . albuterol (PROVENTIL HFA;VENTOLIN HFA) 108 (90 BASE) MCG/ACT inhaler 2 puffs as needed    . calcium carbonate (OS-CAL) 600 MG TABS tablet Take 600 mg by mouth 2 (two) times daily with a meal.    . citalopram (CELEXA) 20 MG tablet TAKE 1 TABLET BY MOUTH EVERY DAY 30 tablet 2  . famotidine (PEPCID) 20 MG tablet TAKE 1 TABLET BY MOUTH DAILY. BEFORE DINNER 30 tablet 5  . Green Tea, Camillia sinensis, ([employer] GREEN TEA EXTRACT PO) Take 1 tablet by mouth 2 (two) times daily.    Marland Kitchen ibuprofen (ADVIL,MOTRIN) 200 MG tablet Take 200 mg by mouth every 8 (eight) hours as needed.    . Omega-3 Fatty Acids (FISH OIL) 1200 MG CAPS Take 1 capsule by mouth daily.    Marland Kitchen OVER THE COUNTER MEDICATION  Take 25 mg by mouth daily. Equate allergy relief    . Red Yeast Rice Extract 600 MG CAPS Take 1 capsule (600 mg total) by mouth 2 (two) times daily. 180 capsule 1  . pantoprazole (PROTONIX) 40 MG tablet TAKE 1 TABLET BY MOUTH DAILY. IN AM 30 MINUTES BEFORE EATING 30 tablet 5  . polyethylene glycol powder (GLYCOLAX/MIRALAX) powder Take 255 g by mouth once. 255 g 0   No current facility-administered medications for this visit.    Review of Systems Review of Systems  Constitutional: Negative.   Respiratory: Negative.   Cardiovascular: Negative.   Gastrointestinal: Negative.     Blood pressure 136/72, pulse 66, resp. rate 12, height 5\' 2"  (1.575 m), weight 180 lb (81.647 kg).  Physical Exam Physical Exam  Constitutional: She is oriented to person, place, and time. She appears well-developed and well-nourished.  Cardiovascular: Normal rate, regular rhythm and normal heart sounds.   Pulmonary/Chest: Effort normal and breath sounds normal.  Abdominal: Soft. Bowel sounds are normal. There is no tenderness.  Neurological: She is alert and oriented to person, place, and time.  Skin: Skin is warm and dry.    Data Reviewed PCP records.  CBC, comp rancid metabolic panel and H. Pylori titer negative February 2016.  Assessment    Candidate for  screening colonoscopy. Upper endoscopy recommended based on severe symptoms prior to treatment with H2/PPI therapy.     Plan    The risks and benefits of screening exams were reviewed. The risks associated with colonoscopy including those of bleeding and perforation were discussed.     The patient will be scheduled for a colonoscopy and upper endoscopy. The patient is scheduled for an upper endoscopy and colonoscopy at [facility] on 09/30/14. She will stop her Fish Oil one week prior. Patient is aware to pre register with the hospital at least 2 days prior. Miralax prescription has been sent into her pharmacy. Patient is aware of date and instructions.    PCP: Mattie Marlin 09/09/2014, 12:02 PM

## 2014-09-09 NOTE — Patient Instructions (Addendum)
Colonoscopy A colonoscopy is an exam to look at the entire large intestine (colon). This exam can help find problems such as tumors, polyps, inflammation, and areas of bleeding. The exam takes about 1 hour.  LET [facility] CARE PROVIDER KNOW ABOUT:   Any allergies you have.  All medicines you are taking, including vitamins, herbs, eye drops, creams, and over-the-counter medicines.  Previous problems you or members of your family have had with the use of anesthetics.  Any blood disorders you have.  Previous surgeries you have had.  Medical conditions you have. RISKS AND COMPLICATIONS  Generally, this is a safe procedure. However, as with any procedure, complications can occur. Possible complications include:  Bleeding.  Tearing or rupture of the colon wall.  Reaction to medicines given during the exam.  Infection (rare). BEFORE THE PROCEDURE   Ask your health care provider about changing or stopping your regular medicines.  You may be prescribed an oral bowel prep. This involves drinking a large amount of medicated liquid, starting the day before your procedure. The liquid will cause you to have multiple loose stools until your stool is almost clear or light green. This cleans out your colon in preparation for the procedure.  Do not eat or drink anything else once you have started the bowel prep, unless your health care provider tells you it is safe to do so.  Arrange for someone to drive you home after the procedure. PROCEDURE   You will be given medicine to help you relax (sedative).  You will lie on your side with your knees bent.  A long, flexible tube with a light and camera on the end (colonoscope) will be inserted through the rectum and into the colon. The camera sends video back to a computer screen as it moves through the colon. The colonoscope also releases carbon dioxide gas to inflate the colon. This helps your health care provider see the area better.  During  the exam, your health care provider may take a small tissue sample (biopsy) to be examined under a microscope if any abnormalities are found.  The exam is finished when the entire colon has been viewed. AFTER THE PROCEDURE   Do not drive for 24 hours after the exam.  You may have a small amount of blood in your stool.  You may pass moderate amounts of gas and have mild abdominal cramping or bloating. This is caused by the gas used to inflate your colon during the exam.  Ask when your test results will be ready and how you will get your results. Make sure you get your test results. Document Released: 04/08/2000 Document Revised: 01/30/2013 Document Reviewed: 12/17/2012 [facility] Patient Information 2015 [place], [place]. This information is not intended to replace advice given to you by your health care provider. Make sure you discuss any questions you have with your health care provider.  The patient is scheduled for an upper endoscopy and colonoscopy at [facility] on 09/30/14. She will stop her Fish Oil one week prior. Patient is aware to pre register with the hospital at least 2 days prior. Miralax prescription has been sent into her pharmacy. Patient is aware of date and instructions.

## 2014-09-30 ENCOUNTER — Encounter
Admission: RE | Disposition: A | Discharge status: Home / Self Care | Payer: Self-pay | Source: Ambulatory Visit | Attending: General Surgery

## 2014-09-30 ENCOUNTER — Ambulatory Visit: Payer: Medicare Other | Admitting: Anesthesiology

## 2014-09-30 ENCOUNTER — Ambulatory Visit
Admission: RE | Admit: 2014-09-30 | Discharge: 2014-09-30 | Disposition: A | Discharge status: Home / Self Care | Payer: Medicare Other | Source: Ambulatory Visit | Attending: Internal Medicine | Admitting: Internal Medicine

## 2014-09-30 ENCOUNTER — Encounter: Payer: Self-pay | Admitting: *Deleted

## 2014-09-30 ENCOUNTER — Ambulatory Visit
Admission: RE | Admit: 2014-09-30 | Discharge: 2014-09-30 | Disposition: A | Discharge status: Home / Self Care | Payer: Medicare Other | Source: Ambulatory Visit | Attending: General Surgery | Admitting: General Surgery

## 2014-09-30 DIAGNOSIS — Z791 Long term (current) use of non-steroidal anti-inflammatories (NSAID): Secondary | ICD-10-CM | POA: Diagnosis not present

## 2014-09-30 DIAGNOSIS — Z780 Asymptomatic menopausal state: Secondary | ICD-10-CM

## 2014-09-30 DIAGNOSIS — R1013 Epigastric pain: Secondary | ICD-10-CM | POA: Diagnosis not present

## 2014-09-30 DIAGNOSIS — K228 Other specified diseases of esophagus: Secondary | ICD-10-CM | POA: Insufficient documentation

## 2014-09-30 DIAGNOSIS — R197 Diarrhea, unspecified: Secondary | ICD-10-CM | POA: Diagnosis not present

## 2014-09-30 DIAGNOSIS — K319 Disease of stomach and duodenum, unspecified: Secondary | ICD-10-CM | POA: Insufficient documentation

## 2014-09-30 DIAGNOSIS — K219 Gastro-esophageal reflux disease without esophagitis: Secondary | ICD-10-CM | POA: Diagnosis not present

## 2014-09-30 DIAGNOSIS — K2950 Unspecified chronic gastritis without bleeding: Secondary | ICD-10-CM | POA: Diagnosis not present

## 2014-09-30 DIAGNOSIS — J45909 Unspecified asthma, uncomplicated: Secondary | ICD-10-CM | POA: Insufficient documentation

## 2014-09-30 DIAGNOSIS — Z79899 Other long term (current) drug therapy: Secondary | ICD-10-CM | POA: Insufficient documentation

## 2014-09-30 DIAGNOSIS — K317 Polyp of stomach and duodenum: Secondary | ICD-10-CM | POA: Diagnosis not present

## 2014-09-30 DIAGNOSIS — F329 Major depressive disorder, single episode, unspecified: Secondary | ICD-10-CM | POA: Insufficient documentation

## 2014-09-30 DIAGNOSIS — K222 Esophageal obstruction: Secondary | ICD-10-CM | POA: Insufficient documentation

## 2014-09-30 DIAGNOSIS — R1310 Dysphagia, unspecified: Secondary | ICD-10-CM | POA: Diagnosis present

## 2014-09-30 DIAGNOSIS — R194 Change in bowel habit: Secondary | ICD-10-CM

## 2014-09-30 HISTORY — PX: ESOPHAGOGASTRODUODENOSCOPY: SHX5428

## 2014-09-30 HISTORY — PX: COLONOSCOPY WITH PROPOFOL: SHX5780

## 2014-09-30 SURGERY — COLONOSCOPY WITH PROPOFOL
Anesthesia: General

## 2014-09-30 MED ORDER — GLYCOPYRROLATE 0.2 MG/ML IJ SOLN
INTRAMUSCULAR | Status: DC | PRN
  Administered 2014-09-30: 0.2 mg via INTRAVENOUS

## 2014-09-30 MED ORDER — PROPOFOL INFUSION 10 MG/ML OPTIME
INTRAVENOUS | Status: DC | PRN
  Administered 2014-09-30: 50 ug/kg/min via INTRAVENOUS

## 2014-09-30 MED ORDER — FENTANYL CITRATE (PF) 100 MCG/2ML IJ SOLN
INTRAMUSCULAR | Status: DC | PRN
  Administered 2014-09-30: 50 ug via INTRAVENOUS

## 2014-09-30 MED ORDER — MIDAZOLAM HCL 2 MG/2ML IJ SOLN
INTRAMUSCULAR | Status: DC | PRN
  Administered 2014-09-30: 1 mg via INTRAVENOUS

## 2014-09-30 MED ORDER — LIDOCAINE HCL (CARDIAC) 20 MG/ML IV SOLN
INTRAVENOUS | Status: DC | PRN
  Administered 2014-09-30: 20 mg via INTRAVENOUS

## 2014-09-30 MED ORDER — SODIUM CHLORIDE 0.45 % IV SOLN
INTRAVENOUS | Status: DC
  Administered 2014-09-30: 14:00:00 via INTRAVENOUS

## 2014-09-30 MED ORDER — PROPOFOL 10 MG/ML IV BOLUS
INTRAVENOUS | Status: DC | PRN
  Administered 2014-09-30: 20 mg via INTRAVENOUS

## 2014-09-30 NOTE — Op Note (Signed)
[facility] Patient Name: Sarah [name] Procedure Date: 09/30/2014 1:35 PM MRN: [id number] Account #: [id number] Date of Birth: [birth year] Admit Type: Outpatient Age: 64 Room: [facility] ENDO ROOM 1 Gender: Female Note Status: Finalized Procedure:         Colonoscopy Indications:       Clinically significant diarrhea of unexplained origin Providers:         Robert Bellow, MD Referring MD:      Deborra Medina, MD (Referring MD) Medicines:         Monitored Anesthesia Care Complications:     No immediate complications. Procedure:         Pre-Anesthesia Assessment:                    - Prior to the procedure, a History and Physical was                     performed, and patient medications, allergies and                     sensitivities were reviewed. The patient's tolerance of                     previous anesthesia was reviewed.                    - The risks and benefits of the procedure and the sedation                     options and risks were discussed with the patient. All                     questions were answered and informed consent was obtained.                    After obtaining informed consent, the colonoscope was                     passed under direct vision. Throughout the procedure, the                     patient's blood pressure, pulse, and oxygen saturations                     were monitored continuously. The Olympus PCF-160AL                     colonoscope (S#. [id number]) was introduced through the anus                     and advanced to the the terminal ileum. The colonoscopy                     was performed without difficulty. The patient tolerated                     the procedure well. The quality of the bowel preparation                     was excellent. Findings:      The terminal ileum appeared normal.      The entire examined colon appeared normal on direct and retroflexion       views. Impression:        - The  examined portion of the  ileum was normal.                    - The entire examined colon is normal on direct and                     retroflexion views.                    - Random biopsies of the right and left colon were                     obtained. Recommendation:    - Telephone endoscopist for pathology results in 1 week. Procedure Code(s): --- Professional ---                    [id number], Colonoscopy, flexible; diagnostic, including                     collection of specimen(s) by brushing or washing, when                     performed (separate procedure) Diagnosis Code(s): --- Professional ---                    R19.7, Diarrhea, unspecified CPT copyright 2014 American Medical Association. All rights reserved. The codes documented in this report are preliminary and upon coder review may  be revised to meet current compliance requirements. Robert Bellow, MD 09/30/2014 2:37:20 PM This report has been signed electronically. Number of Addenda: 0 Note Initiated On: 09/30/2014 1:35 PM Scope Withdrawal Time: 0 hours 15 minutes 46 seconds  Total Procedure Duration: 0 hours 21 minutes 56 seconds       [facility]

## 2014-09-30 NOTE — Anesthesia Postprocedure Evaluation (Signed)
  Anesthesia Post-op Note  Patient: Sarah [name]  Procedure(s) Performed: Procedure(s): COLONOSCOPY WITH PROPOFOL (N/A) ESOPHAGOGASTRODUODENOSCOPY (EGD) (N/A)  Anesthesia type:General  Patient location: PACU  Post pain: Pain level controlled  Post assessment: Post-op Vital signs reviewed, Patient's Cardiovascular Status Stable, Respiratory Function Stable, Patent Airway and No signs of Nausea or vomiting  Post vital signs: Reviewed and stable  Last Vitals:  Filed Vitals:   09/30/14 1450  BP: 95/75  Pulse: 88  Temp:   Resp: 18    Level of consciousness: awake, alert  and patient cooperative  Complications: No apparent anesthesia complications

## 2014-09-30 NOTE — Anesthesia Preprocedure Evaluation (Addendum)
Anesthesia Evaluation  Patient identified by MRN, date of birth, ID band Patient awake    Reviewed: Allergy & Precautions, NPO status , Patient's Chart, lab work & pertinent test results  History of Anesthesia Complications Negative for: history of anesthetic complications  Airway Mallampati: II  TM Distance: >3 FB Neck ROM: Full    Dental no notable dental hx.    Pulmonary asthma ,  breath sounds clear to auscultation  Pulmonary exam normal       Cardiovascular Exercise Tolerance: Poor negative cardio ROS Normal cardiovascular examRhythm:Regular Rate:Normal     Neuro/Psych PSYCHIATRIC DISORDERS Depression negative neurological ROS     GI/Hepatic Neg liver ROS, GERD-  Medicated and Controlled,  Endo/Other  negative endocrine ROS  Renal/GU negative Renal ROS  negative genitourinary   Musculoskeletal negative musculoskeletal ROS (+)   Abdominal   Peds negative pediatric ROS (+)  Hematology negative hematology ROS (+)   Anesthesia Other Findings   Reproductive/Obstetrics negative OB ROS                            Anesthesia Physical Anesthesia Plan  ASA: III  Anesthesia Plan: General   Post-op Pain Management:    Induction: Intravenous  Airway Management Planned: Nasal Cannula  Additional Equipment:   Intra-op Plan:   Post-operative Plan:   Informed Consent: I have reviewed the patients History and Physical, chart, labs and discussed the procedure including the risks, benefits and alternatives for the proposed anesthesia with the patient or authorized representative who has indicated his/her understanding and acceptance.   Dental advisory given  Plan Discussed with: CRNA and Surgeon  Anesthesia Plan Comments:         Anesthesia Quick Evaluation

## 2014-09-30 NOTE — H&P (Signed)
No change in clinical condition. Tolerated prep.Marland Kitchen HEENT: Neg. Cardio; RR. Lungs: Clear.  For EGD Colonoscopy today.

## 2014-09-30 NOTE — Discharge Instructions (Signed)
You should receive your biopsy results in 3-7 days.  Contact the office if you have not heard by then.  Resume regular diet today.  No driving until tomorrow.

## 2014-09-30 NOTE — Transfer of Care (Signed)
Immediate Anesthesia Transfer of Care Note  Patient: Sarah [name]  Procedure(s) Performed: Procedure(s): COLONOSCOPY WITH PROPOFOL (N/A) ESOPHAGOGASTRODUODENOSCOPY (EGD) (N/A)  Patient Location: Endoscopy Unit  Anesthesia Type:General  Level of Consciousness: awake, alert  and oriented  Airway & Oxygen Therapy: Patient Spontanous Breathing and Patient connected to nasal cannula oxygen  Post-op Assessment: Report given to RN and Post -op Vital signs reviewed and stable  Post vital signs: Reviewed and stable  Last Vitals:  Filed Vitals:   09/30/14 1441  BP: 103/73  Pulse: 90  Temp: 35.9 C  Resp:     Complications: No apparent anesthesia complications

## 2014-09-30 NOTE — Anesthesia Procedure Notes (Signed)
Performed by: Demetrius Charity Pre-anesthesia Checklist: Patient identified, Emergency Drugs available, Suction available, Patient being monitored and Timeout performed Patient Re-evaluated:Patient Re-evaluated prior to inductionIntubation Type: IV induction

## 2014-09-30 NOTE — Op Note (Signed)
[facility] Patient Name: Sarah [name] Procedure Date: 09/30/2014 1:34 PM MRN: [id number] Account #: [id number] Date of Birth: Mar 19, 1951 Admit Type: Outpatient Age: 64 Room: [facility] ENDO ROOM 1 Gender: Female Note Status: Finalized Procedure:         Upper GI endoscopy Indications:       Dyspepsia, Dysphagia Providers:         Robert Bellow, MD Referring MD:      Deborra Medina, MD (Referring MD) Medicines:         Monitored Anesthesia Care Complications:     No immediate complications. Procedure:         Pre-Anesthesia Assessment:                    - Prior to the procedure, a History and Physical was                     performed, and patient medications, allergies and                     sensitivities were reviewed. The patient's tolerance of                     previous anesthesia was reviewed.                    - The risks and benefits of the procedure and the sedation                     options and risks were discussed with the patient. All                     questions were answered and informed consent was obtained.                    After obtaining informed consent, the endoscope was passed                     under direct vision. Throughout the procedure, the                     patient's blood pressure, pulse, and oxygen saturations                     were monitored continuously. The Olympus GIF-160 endoscope                     (S#. [id number]) was introduced through the mouth, and                     advanced to the second part of duodenum. The upper GI                     endoscopy was accomplished without difficulty. The patient                     tolerated the procedure well. Findings:      A widely patent Schatzki ring (acquired) was found at the       gastroesophageal junction.      One 10 mm sessile polyp with no bleeding and no stigmata of recent       bleeding was found at the gastroesophageal junction. Biopsies were  taken       with a cold forceps for histology.  Multiple 5 to 10 mm pedunculated polyps with no bleeding and no stigmata       of recent bleeding were found in the gastric body. Biopsies were taken       with a cold forceps for histology.      Segmental mild inflammation characterized by linear erosions was found       in the prepyloric region of the stomach. This was biopsied with a cold       forceps for histology.      The examined duodenum was normal. Impression:        - Widely patent Schatzki ring.                    - One gastroesophageal junction polyp. Biopsied.                    - Multiple gastric polyps. Biopsied.                    - Chronic gastritis. Biopsied.                    - Normal examined duodenum. Recommendation:    - Telephone endoscopist for pathology results in 1 week. Procedure Code(s): --- Professional ---                    [phone], Esophagogastroduodenoscopy, flexible, transoral;                     with biopsy, single or multiple Diagnosis Code(s): --- Professional ---                    K31.7, Polyp of stomach and duodenum                    K29.50, Unspecified chronic gastritis without bleeding                    K30, Functional dyspepsia CPT copyright 2014 American Medical Association. All rights reserved. The codes documented in this report are preliminary and upon coder review may  be revised to meet current compliance requirements. Robert Bellow, MD 09/30/2014 2:10:15 PM This report has been signed electronically. Number of Addenda: 0 Note Initiated On: 09/30/2014 1:34 PM      [facility]

## 2014-10-02 ENCOUNTER — Encounter: Payer: Self-pay | Admitting: General Surgery

## 2014-10-02 LAB — SURGICAL PATHOLOGY

## 2014-10-03 ENCOUNTER — Telehealth: Payer: Self-pay

## 2014-10-03 NOTE — Telephone Encounter (Signed)
-----   Message from Robert Bellow, MD sent at 10/03/2014 12:33 PM EDT ----- Lee's notify the patient all of her biopsies were fine. No evidence of colitis. No cancer or evidence of infection on upper endoscopy. She should continue her present medications.  ----- Message -----    From: [facility] Interface    Sent: 10/02/2014   4:55 PM      To: Robert Bellow, MD

## 2014-10-03 NOTE — Telephone Encounter (Signed)
Notified patient as instructed, patient pleased. Discussed continuing her present medications, patient agrees.

## 2014-10-06 ENCOUNTER — Encounter: Payer: Self-pay | Admitting: *Deleted

## 2014-12-13 ENCOUNTER — Other Ambulatory Visit: Payer: Self-pay | Admitting: Internal Medicine

## 2014-12-15 ENCOUNTER — Other Ambulatory Visit: Payer: Self-pay

## 2014-12-15 MED ORDER — FAMOTIDINE 20 MG PO TABS: ORAL_TABLET | ORAL | Status: DC

## 2014-12-15 NOTE — Telephone Encounter (Signed)
Last OV 4.22.16, last refill 7.23.16.  Please advise refill

## 2015-01-19 ENCOUNTER — Other Ambulatory Visit (INDEPENDENT_AMBULATORY_CARE_PROVIDER_SITE_OTHER): Payer: Medicare Other

## 2015-01-19 DIAGNOSIS — R030 Elevated blood-pressure reading, without diagnosis of hypertension: Secondary | ICD-10-CM

## 2015-01-19 DIAGNOSIS — E785 Hyperlipidemia, unspecified: Secondary | ICD-10-CM | POA: Diagnosis not present

## 2015-01-19 LAB — COMPREHENSIVE METABOLIC PANEL
ALK PHOS: 85 U/L (ref 39–117)
ALT: 15 U/L (ref 0–35)
AST: 14 U/L (ref 0–37)
Albumin: 3.9 g/dL (ref 3.5–5.2)
BUN: 18 mg/dL (ref 6–23)
CHLORIDE: 104 meq/L (ref 96–112)
CO2: 30 mEq/L (ref 19–32)
Calcium: 9.2 mg/dL (ref 8.4–10.5)
Creatinine, Ser: 0.74 mg/dL (ref 0.40–1.20)
GFR: 83.58 mL/min (ref >60.00)
GLUCOSE: 97 mg/dL (ref 70–99)
POTASSIUM: 4.3 meq/L (ref 3.5–5.1)
Sodium: 141 mEq/L (ref 135–145)
Total Bilirubin: 0.6 mg/dL (ref 0.2–1.2)
Total Protein: 6.6 g/dL (ref 6.0–8.3)

## 2015-01-19 LAB — LIPID PANEL
CHOL/HDL RATIO: 4
Cholesterol: 229 mg/dL — ABNORMAL HIGH (ref 0–200)
HDL: 62.5 mg/dL (ref >39.00)
LDL CALC: 152 mg/dL — AB (ref 0–99)
NONHDL: 166.18
Triglycerides: 72 mg/dL (ref 0.0–149.0)
VLDL: 14.4 mg/dL (ref 0.0–40.0)

## 2015-01-26 ENCOUNTER — Encounter: Payer: Self-pay | Admitting: *Deleted

## 2015-01-30 ENCOUNTER — Encounter: Payer: Self-pay | Admitting: Internal Medicine

## 2015-01-30 ENCOUNTER — Telehealth: Payer: Self-pay | Admitting: Internal Medicine

## 2015-01-30 ENCOUNTER — Ambulatory Visit (INDEPENDENT_AMBULATORY_CARE_PROVIDER_SITE_OTHER): Payer: Medicare Other | Admitting: Internal Medicine

## 2015-01-30 VITALS — BP 122/78 | HR 66 | Temp 97.8°F | Resp 12 | Ht 65.75 in | Wt 177.5 lb

## 2015-01-30 DIAGNOSIS — Z0000 Encounter for general adult medical examination without abnormal findings: Secondary | ICD-10-CM

## 2015-01-30 DIAGNOSIS — E785 Hyperlipidemia, unspecified: Secondary | ICD-10-CM | POA: Diagnosis not present

## 2015-01-30 DIAGNOSIS — D2362 Other benign neoplasm of skin of left upper limb, including shoulder: Secondary | ICD-10-CM

## 2015-01-30 DIAGNOSIS — Z23 Encounter for immunization: Secondary | ICD-10-CM

## 2015-01-30 NOTE — Telephone Encounter (Signed)
Pt wanted to know if she would need lab work done before or at her next appt.. Please advise pt

## 2015-01-30 NOTE — Progress Notes (Signed)
Patient ID: Sarah [name], female    DOB: Jul 25, 1950  Age: 64 y.o. MRN: [id number]  .The patient is here for annual Medicare wellness examination and management of other chronic and acute problems.   The risk factors are reflected in the social history.  The roster of all physicians providing medical care to patient - is listed in the Snapshot section of the chart.  Activities of daily living:  The patient is 100% independent in all ADLs: dressing, toileting, feeding as well as independent mobility  Home safety : The patient has smoke detectors in the home. They wear seatbelts.  There are no firearms at home. There is no violence in the home.   There is no risks for hepatitis, STDs or HIV. There is no   history of blood transfusion. They have no travel history to infectious disease endemic areas of the world.  The patient has seen their dentist in the last six month. They have seen their eye doctor in the last year. They admit to slight hearing difficulty with regard to whispered voices and some television programs.  They have deferred audiologic testing in the last year.  They do not  have excessive sun exposure. Discussed the need for sun protection: hats, long sleeves and use of sunscreen if there is significant sun exposure.   Diet: the importance of a healthy diet is discussed. They do have a healthy diet.  The benefits of regular aerobic exercise were discussed. She walks 4 times per week ,  20 minutes.   Depression screen: there are no signs or vegative symptoms of depression- irritability, change in appetite, anhedonia, sadness/tearfullness.  Cognitive assessment: the patient manages all their financial and personal affairs and is actively engaged. They could relate day,date,year and events; recalled 2/3 objects at 3 minutes; performed clock-face test normally.     The risk factors are reflected in the social history.  The roster of all physicians providing medical care to patient  - is listed in the Snapshot section of the chart.  Activities of daily living:  The patient is 100% independent in all ADLs: dressing, toileting, feeding as well as independent mobility  Home safety : The patient has smoke detectors in the home. They wear seatbelts.  There are no firearms at home. There is no violence in the home.   There is no risks for hepatitis, STDs or HIV. There is no   history of blood transfusion. They have no travel history to infectious disease endemic areas of the world.  The patient has seen their dentist in the last six month. They have seen their eye doctor in the last year. They admit to slight hearing difficulty with regard to whispered voices and some television programs.  They have deferred audiologic testing in the last year.  They do not  have excessive sun exposure. Discussed the need for sun protection: hats, long sleeves and use of sunscreen if there is significant sun exposure.   Diet: the importance of a healthy diet is discussed. They do have a healthy diet.  The benefits of regular aerobic exercise were discussed. She walks 4 times per week ,  20 minutes.   Depression screen: there are no signs or vegative symptoms of depression- irritability, change in appetite, anhedonia, sadness/tearfullness.  Cognitive assessment: the patient manages all their financial and personal affairs and is actively engaged. They could relate day,date,year and events; recalled 2/3 objects at 3 minutes; performed clock-face test normally.  The following portions of the  patient's history were reviewed and updated as appropriate: allergies, current medications, past family history, past medical history,  past surgical history, past social history  and problem list.  Visual acuity was not assessed per patient preference since she has regular follow up with her ophthalmologist. Hearing and body mass index were assessed and reviewed.   During the course of the visit the patient  was educated and counseled about appropriate screening and preventive services including : fall prevention , diabetes screening, nutrition counseling, colorectal cancer screening, and recommended immunizations.    CC: The primary encounter diagnosis was Skin benign neoplasm, upper extremity, left. Diagnoses of Encounter for immunization, Hyperlipidemia LDL goal <130, and Initial Medicare annual wellness visit were also pertinent to this visit.   EGD and  colnoscopy was done in June  Left hand has developed a painful scaling macular lesion  on ulnar side,  Present for the psat 3 months  No recent asthma exacerbations No cervix,  S/p hysterectomy DEXA reviewed June 2016 osteopenia    History Sarah [name] has a past medical history of Asthma and Depression.   She has past surgical history that includes Abdominal hysterectomy (1989); Foot surgery (Left, 1985); Knee surgery (2016); Colonoscopy with propofol (N/A, 09/30/2014); and Esophagogastroduodenoscopy (N/A, 09/30/2014).   Her family history includes Diabetes in her mother; Heart disease in her mother; Hyperlipidemia in her mother.She reports that she has never smoked. She has never used smokeless tobacco. She reports that she does not drink alcohol or use illicit drugs.  Outpatient Prescriptions Prior to Visit  Medication Sig Dispense Refill  . albuterol (PROVENTIL HFA;VENTOLIN HFA) 108 (90 BASE) MCG/ACT inhaler Inhale 2 puffs into the lungs every 6 (six) hours as needed for wheezing or shortness of breath. 2 puffs as needed    . calcium carbonate (OS-CAL) 600 MG TABS tablet Take 1,200 mg by mouth daily with breakfast.     . citalopram (CELEXA) 20 MG tablet TAKE 1 TABLET BY MOUTH EVERY DAY 30 tablet 2  . famotidine (PEPCID) 20 MG tablet TAKE 1 TABLET BY MOUTH DAILY. BEFORE DINNER 30 tablet 5  . Green Tea, Camillia sinensis, ([employer] GREEN TEA EXTRACT PO) Take 2 tablets by mouth every morning.     Marland Kitchen ibuprofen (ADVIL,MOTRIN) 200 MG tablet Take 600 mg by  mouth every 8 (eight) hours as needed for moderate pain.     . Omega-3 Fatty Acids (FISH OIL) 1200 MG CAPS Take 1 capsule by mouth daily.    Marland Kitchen OVER THE COUNTER MEDICATION Take 25 mg by mouth daily. Equate allergy relief    . pantoprazole (PROTONIX) 40 MG tablet TAKE 1 TABLET BY MOUTH DAILY. IN AM 30 MINUTES BEFORE EATING 30 tablet 5  . Red Yeast Rice Extract 600 MG CAPS Take 1 capsule (600 mg total) by mouth 2 (two) times daily. (Patient not taking: Reported on 01/30/2015) 180 capsule 1   No facility-administered medications prior to visit.    Review of Systems   Patient denies headache, fevers, malaise, unintentional weight loss, skin rash, eye pain, sinus congestion and sinus pain, sore throat, dysphagia,  hemoptysis , cough, dyspnea, wheezing, chest pain, palpitations, orthopnea, edema, abdominal pain, nausea, melena, diarrhea, constipation, flank pain, dysuria, hematuria, urinary  Frequency, nocturia, numbness, tingling, seizures,  Focal weakness, Loss of consciousness,  Tremor, insomnia, depression, anxiety, and suicidal ideation.      Objective:  BP 122/78 mmHg  Pulse 66  Temp(Src) 97.8 F (36.6 C) (Oral)  Resp 12  Ht 5' 5.75" (1.67 m)  Wt 177 lb 8 oz (80.513 kg)  BMI 28.87 kg/m2  SpO2 97%  Physical Exam   General appearance: alert, cooperative and appears stated age Ears: normal TM's and external ear canals both ears Throat: lips, mucosa, and tongue normal; teeth and gums normal Neck: no adenopathy, no carotid bruit, supple, symmetrical, trachea midline and thyroid not enlarged, symmetric, no tenderness/mass/nodules Back: symmetric, no curvature. ROM normal. No CVA tenderness. Lungs: clear to auscultation bilaterally Heart: regular rate and rhythm, S1, S2 normal, no murmur, click, rub or gallop Abdomen: soft, non-tender; bowel sounds normal; no masses,  no organomegaly Pulses: 2+ and symmetric Skin: Skin color, texture, turgor normal. No rashes or lesions Lymph nodes:  Cervical, supraclavicular, and axillary nodes normal.   Assessment & Plan:   Problem List Items Addressed This Visit    Initial Medicare annual wellness visit    Annual Medicare wellness  exam was done as well as a comprehensive physical exam and management of acute and chronic conditions .  During the course of the visit the patient was educated and counseled about appropriate screening and preventive services including : fall prevention , diabetes screening, nutrition counseling, colorectal cancer screening, and recommended immunizations.  Printed recommendations for health maintenance screenings was given.       Hyperlipidemia LDL goal <130    She did not tolerate trial of red yeast rice.  10 yr rks of CAD is < 7% by today's calculation.        Other Visit Diagnoses    Skin benign neoplasm, upper extremity, left    -  Primary    Relevant Orders    Ambulatory referral to Dermatology    Encounter for immunization           I am having Ms. Smick maintain her albuterol, OVER THE COUNTER MEDICATION, Fish Oil, calcium carbonate, (Green Tea, Camillia sinensis, ([employer] GREEN TEA EXTRACT PO)), ibuprofen, pantoprazole, Red Yeast Rice Extract, citalopram, and famotidine.  No orders of the defined types were placed in this encounter.    There are no discontinued medications.  Follow-up: Return in about 6 months (around 07/31/2015).   Crecencio Mc, MD

## 2015-01-30 NOTE — Patient Instructions (Addendum)
Your cholesterol is fine.  You do not need to resume the Red Yeast Rice or the Fish oil at this tie  You do not need to be seen again this month  I will make a referral to the dermatologist for your skin problem    You have osteopenia by recent DEXA scan  ,  so  Your  risk of fracture in the next  10 yrs is about 10%  .  I recommend weight bearing exercise, 1200 mg calcium through diet /supplements ,  1000 units Vit D daily  And a Repeat DEXA in 2 years .  I recommend getting the majority of your calcium and Vitamin D  through dietary sources rather than supplements given the recent association of calcium supplements with increased coronary artery calcium scores    almond/coconut milk  and cashew milk are great nondairy alternatives to dairy sources and are  low calorie low carb, and cholesterol free .

## 2015-01-30 NOTE — Progress Notes (Signed)
   Subjective:  Patient ID: Sarah [name], female    DOB: [birth year]  Age: 64 y.o. MRN: [id number]

## 2015-01-30 NOTE — Progress Notes (Signed)
Pre-visit discussion using our clinic review tool. No additional management support is needed unless otherwise documented below in the visit note.  

## 2015-02-01 NOTE — Assessment & Plan Note (Signed)

## 2015-02-01 NOTE — Assessment & Plan Note (Signed)
She did not tolerate trial of red yeast rice.  10 yr rks of CAD is < 7% by today's calculation.

## 2015-02-05 NOTE — Telephone Encounter (Signed)
Letter sent concerning appointment.

## 2015-02-16 ENCOUNTER — Other Ambulatory Visit: Payer: Medicare Other

## 2015-02-17 ENCOUNTER — Ambulatory Visit: Payer: Medicare Other | Admitting: Internal Medicine

## 2015-02-24 ENCOUNTER — Other Ambulatory Visit: Payer: Self-pay

## 2015-02-24 MED ORDER — PANTOPRAZOLE SODIUM 40 MG PO TBEC: DELAYED_RELEASE_TABLET | ORAL | Status: DC

## 2015-02-25 ENCOUNTER — Other Ambulatory Visit: Payer: Self-pay | Admitting: Surgical

## 2015-02-25 NOTE — Telephone Encounter (Signed)
Please advise on refill of Citalopram. Last visit 01/30/15

## 2015-02-28 MED ORDER — CITALOPRAM HYDROBROMIDE 20 MG PO TABS
20.0000 mg | ORAL_TABLET | Freq: Every day | ORAL | Status: DC
Start: 2015-02-28 — End: 2015-05-22

## 2015-02-28 NOTE — Telephone Encounter (Signed)
Ok to refill,  Refill sent  

## 2015-03-25 ENCOUNTER — Other Ambulatory Visit: Payer: Self-pay | Admitting: Internal Medicine

## 2015-05-22 ENCOUNTER — Telehealth: Payer: Self-pay | Admitting: *Deleted

## 2015-05-22 ENCOUNTER — Other Ambulatory Visit: Payer: Self-pay

## 2015-05-22 MED ORDER — FAMOTIDINE 20 MG PO TABS: ORAL_TABLET | ORAL | Status: DC

## 2015-05-22 MED ORDER — CITALOPRAM HYDROBROMIDE 20 MG PO TABS: 20.0000 mg | ORAL_TABLET | Freq: Every day | ORAL | Status: DC

## 2015-05-22 MED ORDER — PANTOPRAZOLE SODIUM 40 MG PO TBEC: DELAYED_RELEASE_TABLET | ORAL | Status: DC

## 2015-05-22 NOTE — Telephone Encounter (Signed)
Filled

## 2015-05-22 NOTE — Telephone Encounter (Signed)
Patient has requested a medication refill for famotidine ,citalopram and pantoprazole  Pharmacy [facility]. Contact [phone]

## 2015-06-03 ENCOUNTER — Telehealth: Payer: Self-pay | Admitting: Internal Medicine

## 2015-06-03 MED ORDER — CITALOPRAM HYDROBROMIDE 20 MG PO TABS: 20.0000 mg | ORAL_TABLET | Freq: Every day | ORAL | Status: DC

## 2015-06-03 MED ORDER — FAMOTIDINE 20 MG PO TABS: ORAL_TABLET | ORAL | Status: DC

## 2015-06-03 MED ORDER — PANTOPRAZOLE SODIUM 40 MG PO TBEC: DELAYED_RELEASE_TABLET | ORAL | Status: DC

## 2015-06-03 NOTE — Telephone Encounter (Signed)
medications sent to new pharmacy

## 2015-06-03 NOTE — Telephone Encounter (Signed)
Pt called needing a refill for pantoprazole (PROTONIX) 40 MG tablet, famotidine (PEPCID) 20 MG tablet and citalopram (CELEXA) 20 MG tablet. Pharmacy is [facility]  [place]. Fax number [phone]. Thank you!

## 2015-06-29 ENCOUNTER — Telehealth: Payer: Self-pay

## 2015-06-30 NOTE — Telephone Encounter (Signed)
Error

## 2015-07-26 DIAGNOSIS — L03319 Cellulitis of trunk, unspecified: Secondary | ICD-10-CM | POA: Diagnosis not present

## 2015-07-30 ENCOUNTER — Telehealth: Payer: Self-pay | Admitting: *Deleted

## 2015-07-30 ENCOUNTER — Other Ambulatory Visit (INDEPENDENT_AMBULATORY_CARE_PROVIDER_SITE_OTHER): Payer: Medicare Other

## 2015-07-30 DIAGNOSIS — Z7289 Other problems related to lifestyle: Secondary | ICD-10-CM | POA: Diagnosis not present

## 2015-07-30 DIAGNOSIS — E559 Vitamin D deficiency, unspecified: Secondary | ICD-10-CM | POA: Diagnosis not present

## 2015-07-30 DIAGNOSIS — E785 Hyperlipidemia, unspecified: Secondary | ICD-10-CM

## 2015-07-30 LAB — COMPREHENSIVE METABOLIC PANEL
ALK PHOS: 76 U/L (ref 39–117)
ALT: 11 U/L (ref 0–35)
AST: 11 U/L (ref 0–37)
Albumin: 4.2 g/dL (ref 3.5–5.2)
BILIRUBIN TOTAL: 0.4 mg/dL (ref 0.2–1.2)
BUN: 20 mg/dL (ref 6–23)
CO2: 31 meq/L (ref 19–32)
Calcium: 9.3 mg/dL (ref 8.4–10.5)
Chloride: 104 mEq/L (ref 96–112)
Creatinine, Ser: 0.8 mg/dL (ref 0.40–1.20)
GFR: 76.26 mL/min (ref >60.00)
GLUCOSE: 85 mg/dL (ref 70–99)
Potassium: 4 mEq/L (ref 3.5–5.1)
Sodium: 143 mEq/L (ref 135–145)
Total Protein: 6.6 g/dL (ref 6.0–8.3)

## 2015-07-30 LAB — LIPID PANEL
CHOLESTEROL: 231 mg/dL — AB (ref 0–200)
HDL: 61 mg/dL (ref >39.00)
LDL CALC: 156 mg/dL — AB (ref 0–99)
NONHDL: 170
Total CHOL/HDL Ratio: 4
Triglycerides: 72 mg/dL (ref 0.0–149.0)
VLDL: 14.4 mg/dL (ref 0.0–40.0)

## 2015-07-30 LAB — TSH: TSH: 5.14 u[IU]/mL — AB (ref 0.35–4.50)

## 2015-07-30 LAB — VITAMIN D 25 HYDROXY (VIT D DEFICIENCY, FRACTURES): VITD: 32.17 ng/mL (ref 30.00–100.00)

## 2015-07-30 NOTE — Telephone Encounter (Signed)
Labs and dx?  

## 2015-07-31 ENCOUNTER — Telehealth: Payer: Self-pay | Admitting: Internal Medicine

## 2015-07-31 ENCOUNTER — Encounter: Payer: Self-pay | Admitting: Internal Medicine

## 2015-07-31 ENCOUNTER — Ambulatory Visit (INDEPENDENT_AMBULATORY_CARE_PROVIDER_SITE_OTHER): Payer: Medicare Other | Admitting: Internal Medicine

## 2015-07-31 VITALS — BP 120/78 | HR 83 | Temp 97.6°F | Resp 12 | Ht 66.0 in | Wt 178.5 lb

## 2015-07-31 DIAGNOSIS — M67971 Unspecified disorder of synovium and tendon, right ankle and foot: Secondary | ICD-10-CM | POA: Diagnosis not present

## 2015-07-31 DIAGNOSIS — K2100 Gastro-esophageal reflux disease with esophagitis, without bleeding: Secondary | ICD-10-CM

## 2015-07-31 DIAGNOSIS — E785 Hyperlipidemia, unspecified: Secondary | ICD-10-CM | POA: Diagnosis not present

## 2015-07-31 DIAGNOSIS — K219 Gastro-esophageal reflux disease without esophagitis: Secondary | ICD-10-CM | POA: Diagnosis not present

## 2015-07-31 DIAGNOSIS — F329 Major depressive disorder, single episode, unspecified: Secondary | ICD-10-CM

## 2015-07-31 DIAGNOSIS — J4520 Mild intermittent asthma, uncomplicated: Secondary | ICD-10-CM

## 2015-07-31 DIAGNOSIS — F32A Depression, unspecified: Secondary | ICD-10-CM

## 2015-07-31 LAB — HEPATITIS C ANTIBODY: HCV Ab: NEGATIVE

## 2015-07-31 LAB — HIV ANTIBODY (ROUTINE TESTING W REFLEX): HIV 1&2 Ab, 4th Generation: NONREACTIVE

## 2015-07-31 MED ORDER — DOXYCYCLINE HYCLATE 100 MG PO CAPS: 100.0000 mg | ORAL_CAPSULE | Freq: Two times a day (BID) | ORAL | Status: DC

## 2015-07-31 NOTE — Telephone Encounter (Signed)
Pt needs a AWV around 01/30/2016. Call pt @ [phone]. Thank you!

## 2015-07-31 NOTE — Progress Notes (Signed)
Pre-visit discussion using our clinic review tool. No additional management support is needed unless otherwise documented below in the visit note.  

## 2015-07-31 NOTE — Telephone Encounter (Signed)
Left msg for pt to call office to schedule AWV after 01/30/16 with Denisa/msn

## 2015-07-31 NOTE — Patient Instructions (Addendum)
  Please take a probiotic ( Align, Flora que  [employer] or [employer]), the generic version of one of these over the counter medications, or a PROBIOTIC BEVERAGE  (kombucha,  Whitwell ) Yogurt, or another dietary source) for a minimum of 3 weeks to prevent a serious antibiotic associated diarrhea  Called clostridium dificile colitis.  Taking a probiotic may also prevent vaginitis due to yeast infections and can be continued indefinitely if you feel that it improves your digestion or your elimination (bowels).   I want you to lose 16 lbs over the next 6 months   No cholesterol medication is needed at this time.  You can resume your glass of red wine every night if you enjoy it!  See you in 6 months

## 2015-07-31 NOTE — Progress Notes (Signed)
Subjective:  Patient ID: Sarah [name], female    DOB: [birth year]  Age: 65 y.o. MRN: [id number]  CC: The primary encounter diagnosis was Achilles tendon disorder, right. Diagnoses of Hyperlipidemia LDL goal <130, Esophagitis, reflux, Gastroesophageal reflux disease without esophagitis, Depression, and Asthma, mild intermittent, uncomplicated were also pertinent to this visit.  HPI Sarah [name] presents for follow up on hyperlipidemia , asthma and overweight .  Recent tick  bite that had festered due to retained head eated by Urgent Care with keflex and doxycyline   Right heel problems achilles tendon has a knot on it that has become painful to wear clsoed shoes and cannot lie supine    Asthma:  She has been asymptomatic except in the extreme heat an extreme cold .  She also notes that walkng long distances causes flares. She is not using any maintenance inhalers  Lipids discussed.  Her 10 yr risk of CAD is  < 7%   Discussed overweight  wiith regard to diet and exercise.     Outpatient Prescriptions Prior to Visit  Medication Sig Dispense Refill  . albuterol (PROVENTIL HFA;VENTOLIN HFA) 108 (90 BASE) MCG/ACT inhaler Inhale 2 puffs into the lungs every 6 (six) hours as needed for wheezing or shortness of breath. 2 puffs as needed    . calcium carbonate (OS-CAL) 600 MG TABS tablet Take 1,200 mg by mouth daily with breakfast.     . citalopram (CELEXA) 20 MG tablet Take 1 tablet (20 mg total) by mouth daily. 30 tablet 11  . famotidine (PEPCID) 20 MG tablet TAKE 1 TABLET BY MOUTH DAILY. BEFORE DINNER 30 tablet 11  . Green Tea, Camillia sinensis, ([employer] GREEN TEA EXTRACT PO) Take 2 tablets by mouth every morning.     Marland Kitchen ibuprofen (ADVIL,MOTRIN) 200 MG tablet Take 600 mg by mouth every 8 (eight) hours as needed for moderate pain.     . Omega-3 Fatty Acids (FISH OIL) 1200 MG CAPS Take 1 capsule by mouth daily.    Marland Kitchen OVER THE COUNTER MEDICATION Take 25 mg by mouth daily. Equate allergy relief     . pantoprazole (PROTONIX) 40 MG tablet TAKE 1 TABLET BY MOUTH DAILY. IN AM 30 MINUTES BEFORE EATING 30 tablet 11  . Red Yeast Rice Extract 600 MG CAPS Take 1 capsule (600 mg total) by mouth 2 (two) times daily. (Patient not taking: Reported on 01/30/2015) 180 capsule 1   No facility-administered medications prior to visit.    Review of Systems;  Patient denies headache, fevers, malaise, unintentional weight loss, skin rash, eye pain, sinus congestion and sinus pain, sore throat, dysphagia,  hemoptysis , cough, dyspnea, wheezing, chest pain, palpitations, orthopnea, edema, abdominal pain, nausea, melena, diarrhea, constipation, flank pain, dysuria, hematuria, urinary  Frequency, nocturia, numbness, tingling, seizures,  Focal weakness, Loss of consciousness,  Tremor, insomnia, depression, anxiety, and suicidal ideation.      Objective:  BP 120/78 mmHg  Pulse 83  Temp(Src) 97.6 F (36.4 C) (Oral)  Resp 12  Ht 5\' 6"  (1.676 m)  Wt 178 lb 8 oz (80.967 kg)  BMI 28.82 kg/m2  SpO2 97%  BP Readings from Last 3 Encounters:  07/31/15 120/78  01/30/15 122/78  09/30/14 117/60    Wt Readings from Last 3 Encounters:  07/31/15 178 lb 8 oz (80.967 kg)  01/30/15 177 lb 8 oz (80.513 kg)  09/30/14 173 lb (78.472 kg)    General appearance: alert, cooperative and appears stated age Ears: normal TM's and external ear  canals both ears Throat: lips, mucosa, and tongue normal; teeth and gums normal Neck: no adenopathy, no carotid bruit, supple, symmetrical, trachea midline and thyroid not enlarged, symmetric, no tenderness/mass/nodules Back: symmetric, no curvature. ROM normal. No CVA tenderness. Lungs: clear to auscultation bilaterally Heart: regular rate and rhythm, S1, S2 normal, no murmur, click, rub or gallop Abdomen: soft, non-tender; bowel sounds normal; no masses,  no organomegaly Pulses: 2+ and symmetric Skin: Skin color, texture, turgor normal. No rashes or lesions Ext: right heel with  tender nodule, not red or fluctuant  Lymph nodes: Cervical, supraclavicular, and axillary nodes normal.  No results found for: HGBA1C  Lab Results  Component Value Date   CREATININE 0.80 07/30/2015   CREATININE 0.74 01/19/2015   CREATININE 0.77 06/18/2014    Lab Results  Component Value Date   WBC 5.3 06/18/2014   HGB 13.1 06/18/2014   HCT 38.7 06/18/2014   PLT 205.0 06/18/2014   GLUCOSE 85 07/30/2015   CHOL 231* 07/30/2015   TRIG 72.0 07/30/2015   HDL 61.00 07/30/2015   LDLCALC 156* 07/30/2015   ALT 11 07/30/2015   AST 11 07/30/2015   NA 143 07/30/2015   K 4.0 07/30/2015   CL 104 07/30/2015   CREATININE 0.80 07/30/2015   BUN 20 07/30/2015   CO2 31 07/30/2015   TSH 5.14* 07/30/2015     Assessment & Plan:   Problem List Items Addressed This Visit    Asthma    Currently asymptomatic, on no meds,  Has not had to use albuterol in 6 months         Depression    Managed with citalopram. She continues to regret the loss of her mother who died "9 years ago this moth."      RESOLVED: Esophagitis, reflux    Managed with famotidine       Hyperlipidemia LDL goal <130    She did not tolerate trial of red yeast rice.  10 yr rks of CAD is < 7% by today's calculation.         Esophageal reflux    Managed with morning omeprazole and pre dinner famotidine       Achilles tendon disorder - Primary    She has a large painful nodule on her achilles tendon .  Referral to Podiatry requested       Relevant Orders   Ambulatory referral to Podiatry     A total of 25 minutes of face to face time was spent with patient more than half of which was spent in counselling about the above mentioned conditions  and coordination of care  I have changed Ms. Igarashi's doxycycline. I am also having her maintain her albuterol, OVER THE COUNTER MEDICATION, Fish Oil, calcium carbonate, (Green Tea, Camillia sinensis, (CVS GREEN TEA EXTRACT PO)), ibuprofen, Red Yeast Rice Extract,  pantoprazole, famotidine, citalopram, and cephALEXin.  Meds ordered this encounter  Medications  . cephALEXin (KEFLEX) 500 MG capsule    Sig: Take 500 mg by mouth 3 (three) times daily.    Refill:  0  . DISCONTD: doxycycline (VIBRAMYCIN) 100 MG capsule    Sig: Take 100 mg by mouth 2 (two) times daily.    Refill:  0  . doxycycline (VIBRAMYCIN) 100 MG capsule    Sig: Take 1 capsule (100 mg total) by mouth 2 (two) times daily.    Dispense:  14 capsule    Refill:  0    Medications Discontinued During This Encounter  Medication Reason  .  doxycycline (VIBRAMYCIN) 100 MG capsule Reorder    Follow-up: Return in about 6 months (around 01/30/2016) for annual wellness.   Crecencio Mc, MD

## 2015-08-02 DIAGNOSIS — M67979 Unspecified disorder of synovium and tendon, unspecified ankle and foot: Secondary | ICD-10-CM | POA: Insufficient documentation

## 2015-08-02 NOTE — Assessment & Plan Note (Signed)
Currently asymptomatic, on no meds,  Has not had to use albuterol in 6 months  

## 2015-08-02 NOTE — Assessment & Plan Note (Signed)
She has a large painful nodule on her achilles tendon .  Referral to Podiatry requested

## 2015-08-02 NOTE — Assessment & Plan Note (Signed)
Managed with morning omeprazole and pre dinner famotidine

## 2015-08-02 NOTE — Assessment & Plan Note (Signed)
Managed with famotidine

## 2015-08-02 NOTE — Assessment & Plan Note (Signed)
She did not tolerate trial of red yeast rice.  10 yr rks of CAD is < 7% by today's calculation.  

## 2015-08-02 NOTE — Assessment & Plan Note (Signed)
Managed with citalopram. She continues to regret the loss of her mother who died "9 years ago this moth."

## 2015-08-03 ENCOUNTER — Encounter: Payer: Self-pay | Admitting: *Deleted

## 2015-09-02 ENCOUNTER — Ambulatory Visit: Payer: Medicare Other | Admitting: Podiatry

## 2015-09-14 ENCOUNTER — Ambulatory Visit (INDEPENDENT_AMBULATORY_CARE_PROVIDER_SITE_OTHER): Payer: Medicare Other

## 2015-09-14 ENCOUNTER — Ambulatory Visit (INDEPENDENT_AMBULATORY_CARE_PROVIDER_SITE_OTHER): Payer: Medicare Other | Admitting: Podiatry

## 2015-09-14 ENCOUNTER — Ambulatory Visit: Payer: Self-pay

## 2015-09-14 ENCOUNTER — Encounter: Payer: Self-pay | Admitting: Podiatry

## 2015-09-14 VITALS — BP 146/84 | HR 76 | Resp 12

## 2015-09-14 DIAGNOSIS — M7661 Achilles tendinitis, right leg: Secondary | ICD-10-CM

## 2015-09-14 DIAGNOSIS — M7731 Calcaneal spur, right foot: Secondary | ICD-10-CM | POA: Diagnosis not present

## 2015-09-14 NOTE — Progress Notes (Signed)
She presents today with 3 month month duration of pain to the insertional Achilles tendon area. She states that it hurts particularly after she's been sitting for a while and gets back up to walk she states that she's tried nothing to alleviate her symptoms.  Objective: Vital signs are stable alert and oriented 3. Pulses are palpable. She has severe pain on palpation of the posterior aspect of the calcaneus with a large nonpulsatile nodule. Achilles tendon is painful as well. Pulses are palpable neurologic sensorium is intact deep tendon reflexes are intact and muscle strength is normal. Orthopedic evaluation demonstrates the aforementioned posterior tip protuberance. Radiographs do demonstrate a large retrocalcaneal heel spur with thickening of the Achilles tendon.  Assessment: Achilles tendinitis with retrocalcaneal heel spur right.  Plan: Placed her in a Cam Walker afternoon injection of dexamethasone. I also placed her on a Medrol Dosepak to be followed by meloxicam. I will follow-up with her in 1 month and consider surgical intervention at that time.

## 2015-09-14 NOTE — Patient Instructions (Signed)

## 2015-09-15 ENCOUNTER — Telehealth: Payer: Self-pay | Admitting: *Deleted

## 2015-09-15 MED ORDER — METHYLPREDNISOLONE 4 MG PO TBPK: ORAL_TABLET | ORAL | Status: DC

## 2015-09-15 MED ORDER — MELOXICAM 15 MG PO TABS: 15.0000 mg | ORAL_TABLET | Freq: Every day | ORAL | Status: DC

## 2015-09-15 NOTE — Telephone Encounter (Signed)
Pt states she is at the [facility] and the medication Dr. Milinda Pointer prescribed is not there.  I reviewed the 09/14/2015 orders and Dr. Milinda Pointer had prescribed Medrol dose pack, to be followed by Meloxicam. I apologized to pt for the delay and ordered both medications as Dr. Stephenie Acres standing orders.

## 2015-10-19 ENCOUNTER — Encounter: Payer: Self-pay | Admitting: Podiatry

## 2015-10-19 ENCOUNTER — Telehealth: Payer: Self-pay | Admitting: *Deleted

## 2015-10-19 ENCOUNTER — Ambulatory Visit (INDEPENDENT_AMBULATORY_CARE_PROVIDER_SITE_OTHER): Payer: Medicare Other | Admitting: Podiatry

## 2015-10-19 DIAGNOSIS — M7661 Achilles tendinitis, right leg: Secondary | ICD-10-CM | POA: Diagnosis not present

## 2015-10-19 NOTE — Telephone Encounter (Signed)
-----   Message from [place] sent at 10/19/2015  2:05 PM EDT ----- Regarding: MRI Orders are in! Thanks!

## 2015-10-19 NOTE — Progress Notes (Signed)
She presents today 1 month after having an injection to the Achilles area and being placed in a Cam Walker. She states that recently assure has been hurting an unknown go ahead and have something done about it.  Objective: Vital signs are stable she is alert and oriented 3 pulses remain strong palpable right foot. She is severe pain on palpation of the Achilles at its insertion site of the right heel. No pain on palpation of the Achilles above the insertion site of the calcaneus.  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  Plan: At this point I am requesting an MRI for surgical consideration correction of the posterior aspect of this foot. The patient agrees with this and MRI were performed and surgery will be considered.

## 2015-10-19 NOTE — Telephone Encounter (Addendum)
MRI orders to [facility] [employer]. 10/23/2015-[employer] [place] AUTHORIZED MRI 19147 RIGHT FOOT, AUTHORIZATION [id number], VALID 10/23/2015 - 11/21/2015.  FAXED TO [place].

## 2015-11-05 ENCOUNTER — Ambulatory Visit
Admission: RE | Admit: 2015-11-05 | Discharge: 2015-11-05 | Disposition: A | Discharge status: Home / Self Care | Payer: Medicare Other | Source: Ambulatory Visit | Attending: Podiatry | Admitting: Podiatry

## 2015-11-05 DIAGNOSIS — M7661 Achilles tendinitis, right leg: Secondary | ICD-10-CM | POA: Diagnosis not present

## 2015-11-05 DIAGNOSIS — R600 Localized edema: Secondary | ICD-10-CM | POA: Insufficient documentation

## 2015-11-05 DIAGNOSIS — M19071 Primary osteoarthritis, right ankle and foot: Secondary | ICD-10-CM | POA: Diagnosis not present

## 2015-11-09 NOTE — Telephone Encounter (Addendum)
-----   Message from Garrel Ridgel, [place] sent at 11/08/2015  8:45 PM EDT ----- Have her in . 11/09/2015-Informed pt of Dr. Stephenie Acres request to make an appt to discuss MRI.  Transferred to schedulers.

## 2015-11-18 ENCOUNTER — Ambulatory Visit (INDEPENDENT_AMBULATORY_CARE_PROVIDER_SITE_OTHER): Payer: Medicare Other | Admitting: Podiatry

## 2015-11-18 ENCOUNTER — Encounter: Payer: Self-pay | Admitting: Podiatry

## 2015-11-18 DIAGNOSIS — S86011D Strain of right Achilles tendon, subsequent encounter: Secondary | ICD-10-CM

## 2015-11-18 MED ORDER — TRAMADOL HCL 50 MG PO TABS: 50.0000 mg | ORAL_TABLET | Freq: Three times a day (TID) | ORAL | 0 refills | Status: DC | PRN

## 2015-11-18 NOTE — Patient Instructions (Signed)

## 2015-11-18 NOTE — Progress Notes (Signed)
She presents today for follow-up of her MRI to her right foot. She states that her right posterior foot and heel are really giving her fit. She states the anti-inflammatory doesn't seem to be working as well and this is just killing me.  Objective: Vital signs are stable alert and oriented 3 and have reviewed her past medical history medications allergy surgeries social history and review of systems. She has a friend with her today to help her understand any questions or comments that may arise. Her MRI does state that she has moderate to severe tendinopathy with interstitial tearing of the right Achilles tendon  Assessment: Achilles tendinitis chronic in nature.  Plan: We discussed etiology pathology conservative versus surgical therapies today we consented her for a Achilles tendon lysis retrocalcaneal heel spur resection gastrocnemius recession and an application of a below-knee cast all to the right foot. We discussed in great detail today the possible postop complications which may include and are not limited to stop pain bleeding swelling infection recurrence need further surgery DVT coronary embolism balsam digit loss of limb loss of life. She signed all 3 pages of the consent form and I wrote a prescription for tramadol for acute pain. Follow up with her in August or September for surgical intervention.

## 2015-11-19 ENCOUNTER — Telehealth: Payer: Self-pay | Admitting: Podiatry

## 2015-11-19 ENCOUNTER — Telehealth: Payer: Self-pay | Admitting: *Deleted

## 2015-11-19 NOTE — Telephone Encounter (Signed)
Pt called and needs to schedule her surgery. She said she can now go in august for the surgery. She is trying to fill out the online paperwork for the surgery center and needs a date.Request  a call asap

## 2015-11-19 NOTE — Telephone Encounter (Signed)
"  I wrote on a paper yesterday at Dr. Stephenie Acres that I could have surgery on the 8th of September.  Changes have been changed for the lady that was going on a cruise and I was dog sitting for her.  She had a stroke.  It will work out now I can have surgery anytime in August.  That will work out with me better now."  4:13pm  "I need to hear from you as soon as I can.  I'd like to do surgery earlier.  There's some changes that have been done.  I can have it in August.  I have some forms I need to fill out for you all.  I would like to do it the first or second week of August.  I'm at her house now, I need to know what date I can do it on so I can get these forms out.  Call me as soon as possible.  I returned her call and informed her that Dr. Milinda Pointer can do her surgery on 12/11/2015.  "Good, thank you for calling me back."

## 2015-12-09 ENCOUNTER — Other Ambulatory Visit: Payer: Self-pay | Admitting: Podiatry

## 2015-12-09 MED ORDER — OXYCODONE-ACETAMINOPHEN 10-325 MG PO TABS: ORAL_TABLET | ORAL | 0 refills | Status: DC

## 2015-12-09 MED ORDER — CEPHALEXIN 500 MG PO CAPS: 500.0000 mg | ORAL_CAPSULE | Freq: Three times a day (TID) | ORAL | 0 refills | Status: DC

## 2015-12-09 MED ORDER — PROMETHAZINE HCL 25 MG PO TABS: 25.0000 mg | ORAL_TABLET | Freq: Three times a day (TID) | ORAL | 0 refills | Status: DC | PRN

## 2015-12-11 ENCOUNTER — Encounter: Payer: Self-pay | Admitting: Podiatry

## 2015-12-11 DIAGNOSIS — M216X9 Other acquired deformities of unspecified foot: Secondary | ICD-10-CM | POA: Diagnosis not present

## 2015-12-11 DIAGNOSIS — M65871 Other synovitis and tenosynovitis, right ankle and foot: Secondary | ICD-10-CM | POA: Diagnosis not present

## 2015-12-11 DIAGNOSIS — J45909 Unspecified asthma, uncomplicated: Secondary | ICD-10-CM | POA: Diagnosis not present

## 2015-12-11 DIAGNOSIS — M25571 Pain in right ankle and joints of right foot: Secondary | ICD-10-CM | POA: Diagnosis not present

## 2015-12-11 DIAGNOSIS — S86011D Strain of right Achilles tendon, subsequent encounter: Secondary | ICD-10-CM | POA: Diagnosis not present

## 2015-12-11 DIAGNOSIS — M7661 Achilles tendinitis, right leg: Secondary | ICD-10-CM | POA: Diagnosis not present

## 2015-12-11 DIAGNOSIS — M7731 Calcaneal spur, right foot: Secondary | ICD-10-CM | POA: Diagnosis not present

## 2015-12-16 ENCOUNTER — Ambulatory Visit (INDEPENDENT_AMBULATORY_CARE_PROVIDER_SITE_OTHER): Payer: Medicare Other

## 2015-12-16 ENCOUNTER — Encounter: Payer: Self-pay | Admitting: Podiatry

## 2015-12-16 ENCOUNTER — Ambulatory Visit (INDEPENDENT_AMBULATORY_CARE_PROVIDER_SITE_OTHER): Payer: Medicare Other | Admitting: Podiatry

## 2015-12-16 DIAGNOSIS — Z9889 Other specified postprocedural states: Secondary | ICD-10-CM

## 2015-12-16 DIAGNOSIS — S86011D Strain of right Achilles tendon, subsequent encounter: Secondary | ICD-10-CM | POA: Diagnosis not present

## 2015-12-16 NOTE — Progress Notes (Unsigned)
DOS 12/11/2015 Gastrocnemius recession right leg, achilles tenolysis right heel, retrocalcaneal heel spur, resection right, application below knee cast.

## 2015-12-16 NOTE — Progress Notes (Signed)
She presents today 5 days status post gastroc recession and retrocalcaneal heel spur resection with cast application. She denies fever chills nausea and vomiting muscle aches and pains. States that she has had to stand on the cast a couple of times just to keep her balance. She relates that she is really had absolutely no pain. The only tenderness that she has at this point is muscle pain in her medial thigh.  Objective: Vital signs are stable she is alert and oriented 3 cast is intact to the right lower extremity she has good sensation and range of motion to her toes. She does not have any pain with the cast at all. Radiographs demonstrate a very nice resection of the posterior aspect of the calcaneus.  Assessment: Well-healed surgical the right.  Plan: Follow-up with her 1 week for cast removal and reapplication.

## 2015-12-23 ENCOUNTER — Ambulatory Visit (INDEPENDENT_AMBULATORY_CARE_PROVIDER_SITE_OTHER): Payer: Medicare Other | Admitting: Podiatry

## 2015-12-23 ENCOUNTER — Encounter: Payer: Self-pay | Admitting: Podiatry

## 2015-12-23 DIAGNOSIS — Z9889 Other specified postprocedural states: Secondary | ICD-10-CM

## 2015-12-23 DIAGNOSIS — S86011D Strain of right Achilles tendon, subsequent encounter: Secondary | ICD-10-CM | POA: Diagnosis not present

## 2015-12-23 NOTE — Progress Notes (Signed)
She presents today for follow-up of her gastroc recession a retrocalcaneal heel spur resection and Achilles tendon repair date of surgery 12/11/2015. She states that I have some pain but is really not doing very bad at all. She relates to standing on the cast and walking with it.  Objective: Vital signs are stable she is alert and oriented 3 she denies shortness of breath or chest pain. Cast appears to be intact but worn to the toes. Cast was removed sutures are intact margins well coapted staples were removed from the gastroc recession and the superior aspect of the inferior incision. However she did have a blood blister present. No signs of infection. Radiographs not taken today.  Assessment: Healing surgical foot right.  Plan: Redressed today after removing some of her staples. Placed a new below-knee cast. We'll follow up with her in 2 weeks she will remain nonweightbearing.

## 2016-01-06 ENCOUNTER — Encounter: Payer: Medicare Other | Admitting: Podiatry

## 2016-01-06 ENCOUNTER — Ambulatory Visit (INDEPENDENT_AMBULATORY_CARE_PROVIDER_SITE_OTHER): Payer: Medicare Other | Admitting: Podiatry

## 2016-01-06 ENCOUNTER — Encounter: Payer: Self-pay | Admitting: Podiatry

## 2016-01-06 DIAGNOSIS — Z9889 Other specified postprocedural states: Secondary | ICD-10-CM

## 2016-01-06 DIAGNOSIS — S86011D Strain of right Achilles tendon, subsequent encounter: Secondary | ICD-10-CM | POA: Diagnosis not present

## 2016-01-06 NOTE — Progress Notes (Signed)
She presents today 4 weeks status post retrocalcaneal heel spur resection right foot and a gastroc recession right leg. She states that she is doing great she has no pain whatsoever. He denies shortness of breath or chest pain. Continues her aspirin daily.  Objective: Vital signs are stable alert and oriented 3. Pulses are palpable. She has good range of motion of the ankle joint and good strength. Incision site is going on to heal uneventfully.  Assessment: Well-healing surgical foot.  Plan: In lieu of placing another cast today we will put her in a Cam Walker and allow her to start walking on this. She is not to walk without her cam walker.

## 2016-01-11 ENCOUNTER — Encounter: Payer: Medicare Other | Admitting: Podiatry

## 2016-02-01 ENCOUNTER — Encounter: Payer: Self-pay | Admitting: Podiatry

## 2016-02-01 ENCOUNTER — Ambulatory Visit (INDEPENDENT_AMBULATORY_CARE_PROVIDER_SITE_OTHER): Payer: Medicare Other | Admitting: Podiatry

## 2016-02-01 ENCOUNTER — Ambulatory Visit (INDEPENDENT_AMBULATORY_CARE_PROVIDER_SITE_OTHER): Payer: Medicare Other

## 2016-02-01 DIAGNOSIS — Z9889 Other specified postprocedural states: Secondary | ICD-10-CM

## 2016-02-01 DIAGNOSIS — S86011D Strain of right Achilles tendon, subsequent encounter: Secondary | ICD-10-CM | POA: Diagnosis not present

## 2016-02-02 ENCOUNTER — Ambulatory Visit: Payer: Medicare Other

## 2016-02-02 NOTE — Progress Notes (Signed)
She presents today for follow-up of her retrocalcaneal heel spur resection Achilles tendon lysis and gastroc recession 12/11/2015. She states that she is feeling wonderful. She states that all her pain has resolved.  Objective: Vital signs are stable she is alert and oriented 3 she has full range of motion of the foot and ankle with no pain on palpation or on plantarflexion against resistance of the Achilles and gastroc release complex. All incisions have gone on to heal uneventfully.  Assessment: Well-healing surgical leg and ankle right foot.  Plan: I would allow her to get back into her tennis shoes but I encouraged her to still be very careful for approximately one more month as far as her activity level and her ambulation. Should she start to develop some soreness she is to get back into her Cam Walker.

## 2016-03-07 ENCOUNTER — Encounter: Payer: Medicare Other | Admitting: Podiatry

## 2016-03-09 ENCOUNTER — Ambulatory Visit (INDEPENDENT_AMBULATORY_CARE_PROVIDER_SITE_OTHER): Payer: Medicare Other

## 2016-03-09 ENCOUNTER — Ambulatory Visit (INDEPENDENT_AMBULATORY_CARE_PROVIDER_SITE_OTHER): Payer: Medicare Other | Admitting: Podiatry

## 2016-03-09 DIAGNOSIS — S86011D Strain of right Achilles tendon, subsequent encounter: Secondary | ICD-10-CM

## 2016-03-09 DIAGNOSIS — Z9889 Other specified postprocedural states: Secondary | ICD-10-CM

## 2016-03-09 DIAGNOSIS — T148XXA Other injury of unspecified body region, initial encounter: Secondary | ICD-10-CM

## 2016-03-09 NOTE — Progress Notes (Signed)
She presents today for follow-up of her retrocalcaneal heel spur resection and Achilles tendon lysis. She states that she was doing very well until she misstepped yesterday and hurt the dorsal aspect of her right foot along the fourth metatarsal area and the back of the heel is a little bit tender.  Objective: Vital signs are stable she is alert and oriented 3. She has tenderness and edema overlying the second third and fourth metatarsal phalangeal joints and next. Radiographs do not demonstrate any type of osseus abnormalities today. The Achilles tendon area appears to be healing very nicely see no signs of complications there either.  Assessment: Cannot rule out fracture though very well. His be a bone contusion due to her fall.  Plan: Follow up with me in 2 weeks. X-rays will be taken at that time.

## 2016-03-14 ENCOUNTER — Ambulatory Visit (INDEPENDENT_AMBULATORY_CARE_PROVIDER_SITE_OTHER): Payer: Medicare Other | Admitting: Internal Medicine

## 2016-03-14 ENCOUNTER — Encounter: Payer: Self-pay | Admitting: Internal Medicine

## 2016-03-14 DIAGNOSIS — J4521 Mild intermittent asthma with (acute) exacerbation: Secondary | ICD-10-CM | POA: Diagnosis not present

## 2016-03-14 DIAGNOSIS — B9789 Other viral agents as the cause of diseases classified elsewhere: Secondary | ICD-10-CM | POA: Diagnosis not present

## 2016-03-14 DIAGNOSIS — J069 Acute upper respiratory infection, unspecified: Secondary | ICD-10-CM | POA: Diagnosis not present

## 2016-03-14 MED ORDER — LEVOFLOXACIN 500 MG PO TABS: 500.0000 mg | ORAL_TABLET | Freq: Every day | ORAL | 0 refills | Status: DC

## 2016-03-14 MED ORDER — PREDNISONE 10 MG PO TABS: ORAL_TABLET | ORAL | 0 refills | Status: DC

## 2016-03-14 MED ORDER — ALBUTEROL SULFATE HFA 108 (90 BASE) MCG/ACT IN AERS
2.0000 | INHALATION_SPRAY | Freq: Four times a day (QID) | RESPIRATORY_TRACT | 1 refills | Status: DC | PRN
Start: 2016-03-14 — End: 2016-03-24

## 2016-03-14 NOTE — Progress Notes (Signed)
Pre visit review using our clinic review tool, if applicable. No additional management support is needed unless otherwise documented below in the visit note. 

## 2016-03-14 NOTE — Patient Instructions (Addendum)
You have a viral  Syndrome .  The post nasal drip is causing your sore throat and cough .  Flush your sinuses once or  twice daily with  NeilMed's sinus rinse. This will clear the mucus   Prednisone taper for the inflammation and to prevent asthma attack .    Use benadryl 25 mg at bedtime  for the post nasal drip and Sudafed PE or Afrin nasal spray every 12 hours as needed for the congestion.  Gargle with salt water as needed for the sore throat.  Use Delsym or Robitussin for the cough   If you develop T > 100.4,  Green nasal discharge,  Or facial pain,  Start the Levaquin antibiotic    Please take a probiotic  for a minimum of 3 weeks to prevent a serious antibiotic associated diarrhea  Called clostridium dificile colitis.  You can do this by eating a serving of yogurt every day Taking a probiotic may also prevent vaginitis due to yeast infections and can be continued indefinitely if you feel that it improves your digestion or your elimination (bowels).Marland Kitchen

## 2016-03-14 NOTE — Progress Notes (Signed)
Subjective:  Patient ID: Sarah [name], female    DOB: July 10, 1950  Age: 65 y.o. MRN: [id number]  CC: There were no encounter diagnoses.  HPI Sarah [name] presents for viral URI,  Symptoms started yesterday with a sore throat yesterday ,  And sinus congestion has been persistent,   With minimal drainage.  Some cough,  Nonproductive .  No fevers,  body aches.  No sick contacts. Spending time with hospice patient. Worried about being contagious.   Outpatient Medications Prior to Visit  Medication Sig Dispense Refill  . albuterol (PROVENTIL HFA;VENTOLIN HFA) 108 (90 BASE) MCG/ACT inhaler Inhale 2 puffs into the lungs every 6 (six) hours as needed for wheezing or shortness of breath. 2 puffs as needed    . calcium carbonate (OS-CAL) 600 MG TABS tablet Take 1,200 mg by mouth daily with breakfast.     . citalopram (CELEXA) 20 MG tablet Take 1 tablet (20 mg total) by mouth daily. 30 tablet 11  . famotidine (PEPCID) 20 MG tablet TAKE 1 TABLET BY MOUTH DAILY. BEFORE DINNER 30 tablet 11  . Green Tea, Camillia sinensis, ([employer] GREEN TEA EXTRACT PO) Take 2 tablets by mouth every morning.     Marland Kitchen ibuprofen (ADVIL,MOTRIN) 200 MG tablet Take 600 mg by mouth every 8 (eight) hours as needed for moderate pain.     . Omega-3 Fatty Acids (FISH OIL) 1200 MG CAPS Take 1 capsule by mouth daily.    Marland Kitchen OVER THE COUNTER MEDICATION Take 25 mg by mouth daily. Equate allergy relief    . oxyCODONE-acetaminophen (PERCOCET) 10-325 MG tablet Take one to two tablets by mouth every six to eight hours as needed for pain. 40 tablet 0  . pantoprazole (PROTONIX) 40 MG tablet TAKE 1 TABLET BY MOUTH DAILY. IN AM 30 MINUTES BEFORE EATING 30 tablet 11  . Red Yeast Rice Extract 600 MG CAPS Take 1 capsule (600 mg total) by mouth 2 (two) times daily. 180 capsule 1  . traMADol (ULTRAM) 50 MG tablet Take 1 tablet (50 mg total) by mouth every 8 (eight) hours as needed. 50 tablet 0  . meloxicam (MOBIC) 15 MG tablet Take 1 tablet (15 mg total)  by mouth daily. (Patient not taking: Reported on 03/14/2016) 30 tablet 0  . promethazine (PHENERGAN) 25 MG tablet Take 1 tablet (25 mg total) by mouth every 8 (eight) hours as needed for nausea or vomiting. (Patient not taking: Reported on 03/14/2016) 20 tablet 0   No facility-administered medications prior to visit.     Review of Systems;  Patient denies headache, fevers, malaise, unintentional weight loss, skin rash, eye pain, sinus congestion and sinus pain, t, dysphagia,  hemoptysis , , dyspnea, wheezing, chest pain, palpitations, orthopnea, edema, abdominal pain, nausea, melena, diarrhea, constipation, flank pain, dysuria, hematuria, urinary  Frequency, nocturia, numbness, tingling, seizures,  Focal weakness, Loss of consciousness,  Tremor, insomnia, depression, anxiety, and suicidal ideation.      Objective:  BP (!) 148/76   Pulse 89   Temp 98.1 F (36.7 C) (Oral)   Resp 15   Wt 171 lb 6.4 oz (77.7 kg)   SpO2 94%   BMI 27.66 kg/m   BP Readings from Last 3 Encounters:  03/14/16 (!) 148/76  09/14/15 (!) 146/84  07/31/15 120/78    Wt Readings from Last 3 Encounters:  03/14/16 171 lb 6.4 oz (77.7 kg)  07/31/15 178 lb 8 oz (81 kg)  01/30/15 177 lb 8 oz (80.5 kg)    General  appearance: alert, cooperative and appears stated age Ears: normal TM's and external ear canals both ears Throat: lips, mucosa, and tongue normal; teeth and gums normal Neck: no adenopathy, no carotid bruit, supple, symmetrical, trachea midline and thyroid not enlarged, symmetric, no tenderness/mass/nodules Back: symmetric, no curvature. ROM normal. No CVA tenderness. Lungs: clear to auscultation bilaterally Heart: regular rate and rhythm, S1, S2 normal, no murmur, click, rub or gallop Abdomen: soft, non-tender; bowel sounds normal; no masses,  no organomegaly Pulses: 2+ and symmetric Skin: Skin color, texture, turgor normal. No rashes or lesions Lymph nodes: Cervical, supraclavicular, and axillary  nodes normal.  No results found for: HGBA1C  Lab Results  Component Value Date   CREATININE 0.80 07/30/2015   CREATININE 0.74 01/19/2015   CREATININE 0.77 06/18/2014    Lab Results  Component Value Date   WBC 5.3 06/18/2014   HGB 13.1 06/18/2014   HCT 38.7 06/18/2014   PLT 205.0 06/18/2014   GLUCOSE 85 07/30/2015   CHOL 231 (H) 07/30/2015   TRIG 72.0 07/30/2015   HDL 61.00 07/30/2015   LDLCALC 156 (H) 07/30/2015   ALT 11 07/30/2015   AST 11 07/30/2015   NA 143 07/30/2015   K 4.0 07/30/2015   CL 104 07/30/2015   CREATININE 0.80 07/30/2015   BUN 20 07/30/2015   CO2 31 07/30/2015   TSH 5.14 (H) 07/30/2015    Mr Foot Right Wo Contrast  Result Date: 11/05/2015 CLINICAL DATA:  Pain and swelling at the back of the heel for 2-3 months. EXAM: MRI OF THE RIGHT FOREFOOT WITHOUT CONTRAST TECHNIQUE: Multiplanar, multisequence MR imaging of the ankle was performed. No intravenous contrast was administered. COMPARISON:  None. FINDINGS: TENDONS Peroneal: Intact. Posteromedial: Mild tenosynovitis involving the posterior tibialis tendon. No tendinopathy or tear. Anterior: Intact. Achilles: Changes of chronic Achilles tendinopathy/ tendinitis with a markedly thickened tendon with increased T2 signal intensity and probable interstitial tears. Suspect mild insertional tearing of the superficial fibers. There is also associated reactive marrow edema in the calcaneus and mild retrocalcaneal bursitis. Plantar Fascia: Intact LIGAMENTS Lateral: Intact Medial: Intact CARTILAGE Ankle Joint: Mild degenerative changes and small joint effusion. No full-thickness cartilage defect or osteochondral lesion. Subtalar Joints/Sinus Tarsi: Mild degenerative changes. No joint effusion. The sinus tarsi is normal. Bones: No acute bony findings other than the reactive marrow edema in the calcaneus. Other: None IMPRESSION: 1. Significant chronic Achilles tendinopathy/tendinitis with probable superficial tear is involving the  insertional fibers. Associated reactive marrow edema in the calcaneus. 2. Intact medial and lateral ankle ligaments and tendons. 3. Mild ankle, hindfoot and midfoot degenerative changes but no stress fracture or osteochondral abnormality. Electronically Signed   By: Marijo Sanes M.D.   On: 11/05/2015 10:24    Assessment & Plan:   Problem List Items Addressed This Visit    None      I am having Ms. Biegel maintain her albuterol, OVER THE COUNTER MEDICATION, Fish Oil, calcium carbonate, (Green Tea, Camillia sinensis, ([employer] GREEN TEA EXTRACT PO)), ibuprofen, Red Yeast Rice Extract, pantoprazole, famotidine, citalopram, meloxicam, traMADol, oxyCODONE-acetaminophen, and promethazine.  No orders of the defined types were placed in this encounter.   There are no discontinued medications.  Follow-up: No Follow-up on file.   Crecencio Mc, MD

## 2016-03-15 ENCOUNTER — Telehealth: Payer: Self-pay

## 2016-03-15 DIAGNOSIS — J069 Acute upper respiratory infection, unspecified: Secondary | ICD-10-CM | POA: Insufficient documentation

## 2016-03-15 DIAGNOSIS — B9789 Other viral agents as the cause of diseases classified elsewhere: Secondary | ICD-10-CM

## 2016-03-15 NOTE — Telephone Encounter (Signed)
PA for ProAir started on cover my meds

## 2016-03-15 NOTE — Assessment & Plan Note (Signed)
URI is most likely viral given the mild HEENT  Symptoms  And normal exam.   I have explained that in viral URIS, an antibiotic will not help the symptoms and will increase the risk of developing diarrhea.,  Continue oral and nasal decongestants, and tylenol 650 mq 8 hrs for aches and pains,  And will prednisone  taper for inflammation 

## 2016-03-24 MED ORDER — ALBUTEROL SULFATE HFA 108 (90 BASE) MCG/ACT IN AERS
2.0000 | INHALATION_SPRAY | Freq: Four times a day (QID) | RESPIRATORY_TRACT | 1 refills | Status: DC | PRN
Start: 2016-03-24 — End: 2016-04-22

## 2016-03-24 NOTE — Telephone Encounter (Signed)
PA was denied as she needs to have tried two other alternatives first, ventolin HFA.  Please advise. thanks

## 2016-03-24 NOTE — Addendum Note (Signed)
Addended by: Crecencio Mc on: 03/24/2016 05:13 PM   Modules accepted: Orders

## 2016-03-25 NOTE — Telephone Encounter (Signed)
Rx faxed

## 2016-03-28 ENCOUNTER — Ambulatory Visit: Payer: Medicare Other | Admitting: Podiatry

## 2016-04-05 ENCOUNTER — Telehealth: Payer: Self-pay | Admitting: *Deleted

## 2016-04-05 NOTE — Telephone Encounter (Signed)
Patient has requested to have a Rx in place of the ventolin, due to cost . Pharmacy [facility]

## 2016-04-22 ENCOUNTER — Encounter: Payer: Self-pay | Admitting: Internal Medicine

## 2016-04-22 ENCOUNTER — Other Ambulatory Visit: Payer: Self-pay | Admitting: Internal Medicine

## 2016-04-22 ENCOUNTER — Ambulatory Visit (INDEPENDENT_AMBULATORY_CARE_PROVIDER_SITE_OTHER): Payer: Medicare Other | Admitting: Internal Medicine

## 2016-04-22 ENCOUNTER — Ambulatory Visit (INDEPENDENT_AMBULATORY_CARE_PROVIDER_SITE_OTHER): Payer: Medicare Other

## 2016-04-22 VITALS — BP 126/72 | HR 84 | Temp 97.6°F | Ht 66.0 in | Wt 171.0 lb

## 2016-04-22 DIAGNOSIS — R1032 Left lower quadrant pain: Secondary | ICD-10-CM

## 2016-04-22 DIAGNOSIS — Z23 Encounter for immunization: Secondary | ICD-10-CM

## 2016-04-22 DIAGNOSIS — R350 Frequency of micturition: Secondary | ICD-10-CM

## 2016-04-22 DIAGNOSIS — R1030 Lower abdominal pain, unspecified: Secondary | ICD-10-CM

## 2016-04-22 DIAGNOSIS — M25552 Pain in left hip: Secondary | ICD-10-CM | POA: Diagnosis not present

## 2016-04-22 DIAGNOSIS — N3090 Cystitis, unspecified without hematuria: Secondary | ICD-10-CM | POA: Diagnosis not present

## 2016-04-22 LAB — POCT URINALYSIS DIPSTICK
BILIRUBIN UA: NEGATIVE
GLUCOSE UA: NEGATIVE
KETONES UA: NEGATIVE
Nitrite, UA: POSITIVE
PROTEIN UA: NEGATIVE
SPEC GRAV UA: 1.015
Urobilinogen, UA: 0.2
pH, UA: 5.5

## 2016-04-22 LAB — URINALYSIS, ROUTINE W REFLEX MICROSCOPIC
Bilirubin Urine: NEGATIVE
Ketones, ur: NEGATIVE
Nitrite: POSITIVE — AB
PH: 5.5 (ref 5.0–8.0)
Specific Gravity, Urine: 1.02 (ref 1.000–1.030)
TOTAL PROTEIN, URINE-UPE24: NEGATIVE
Urine Glucose: NEGATIVE
Urobilinogen, UA: 0.2 (ref 0.0–1.0)

## 2016-04-22 MED ORDER — ALBUTEROL SULFATE HFA 108 (90 BASE) MCG/ACT IN AERS: 2.0000 | INHALATION_SPRAY | Freq: Four times a day (QID) | RESPIRATORY_TRACT | 11 refills | Status: DC | PRN

## 2016-04-22 MED ORDER — CIPROFLOXACIN HCL 250 MG PO TABS: 250.0000 mg | ORAL_TABLET | Freq: Two times a day (BID) | ORAL | 0 refills | Status: DC

## 2016-04-22 NOTE — Progress Notes (Addendum)
Subjective:  Patient ID: Sarah [name], female    DOB: 1950/12/22  Age: 65 y.o. MRN: [id number]  CC: The primary encounter diagnosis was Frequency of urination. Diagnoses of Encounter for immunization, Deep left inguinal pain, and Cystitis were also pertinent to this visit.  HPI VEGAS RITTNER presents for evaluation of left inguinal discomfort associated with increased urination for the past week  She denies dysuria, itching and  Vaginal  Discharge, but has been self treating with OTC antifungal medication,  monistat 7 .   She is s/p TAH/BSO, post menopausal and has not been sexually active for decades Bowels moving normally   Symptoms brought on by sitting to urinate. No nausea, back pain or fevers.    Outpatient Medications Prior to Visit  Medication Sig Dispense Refill  . calcium carbonate (OS-CAL) 600 MG TABS tablet Take 1,200 mg by mouth daily with breakfast.     . citalopram (CELEXA) 20 MG tablet Take 1 tablet (20 mg total) by mouth daily. 30 tablet 11  . famotidine (PEPCID) 20 MG tablet TAKE 1 TABLET BY MOUTH DAILY. BEFORE DINNER 30 tablet 11  . ibuprofen (ADVIL,MOTRIN) 200 MG tablet Take 600 mg by mouth every 8 (eight) hours as needed for moderate pain.     . Omega-3 Fatty Acids (FISH OIL) 1200 MG CAPS Take 1 capsule by mouth daily.    Marland Kitchen OVER THE COUNTER MEDICATION Take 25 mg by mouth daily. Equate allergy relief    . pantoprazole (PROTONIX) 40 MG tablet TAKE 1 TABLET BY MOUTH DAILY. IN AM 30 MINUTES BEFORE EATING 30 tablet 11  . albuterol (PROVENTIL HFA;VENTOLIN HFA) 108 (90 Base) MCG/ACT inhaler Inhale 2 puffs into the lungs every 6 (six) hours as needed for wheezing or shortness of breath. PLEASE FILL WITH VENTOLIN HFA PER INSURANCE RESTRICTIONS 18 g 1  . levofloxacin (LEVAQUIN) 500 MG tablet Take 1 tablet (500 mg total) by mouth daily. 7 tablet 0  . meloxicam (MOBIC) 15 MG tablet Take 1 tablet (15 mg total) by mouth daily. (Patient not taking: Reported on 03/14/2016) 30 tablet  0  . predniSONE (DELTASONE) 10 MG tablet 6 tablets on Day 1 , then reduce by 1 tablet daily until gone 21 tablet 0  . Red Yeast Rice Extract 600 MG CAPS Take 1 capsule (600 mg total) by mouth 2 (two) times daily. 180 capsule 1  . traMADol (ULTRAM) 50 MG tablet Take 1 tablet (50 mg total) by mouth every 8 (eight) hours as needed. 50 tablet 0   No facility-administered medications prior to visit.     Review of Systems;  Patient denies headache, fevers, malaise, unintentional weight loss, skin rash, eye pain, sinus congestion and sinus pain, sore throat, dysphagia,  hemoptysis , cough, dyspnea, wheezing, chest pain, palpitations, orthopnea, edema, abdominal pain, nausea, melena, diarrhea, constipation,  dysuria, hematuria,  nocturia, numbness, tingling, seizures,  Focal weakness, Loss of consciousness,  Tremor, insomnia, depression, anxiety, and suicidal ideation.      Objective:  BP 126/72   Pulse 84   Temp 97.6 F (36.4 C) (Oral)   Ht 5\' 6"  (1.676 m)   Wt 171 lb (77.6 kg)   SpO2 98%   BMI 27.60 kg/m   BP Readings from Last 3 Encounters:  04/22/16 126/72  03/14/16 (!) 148/76  09/14/15 (!) 146/84    Wt Readings from Last 3 Encounters:  04/22/16 171 lb (77.6 kg)  03/14/16 171 lb 6.4 oz (77.7 kg)  07/31/15 178 lb 8 oz (81  kg)   General Appearance:    Alert, cooperative, no distress, appears stated age  Head:    Normocephalic, without obvious abnormality, atraumatic  Eyes:    PERRL, conjunctiva/corneas clear, EOM's intact, fundi    benign, both eyes  Ears:    Normal TM's and external ear canals, both ears  Nose:   Nares normal, septum midline, mucosa normal, no drainage    or sinus tenderness  Throat:   Lips, mucosa, and tongue normal; teeth and gums normal  Neck:   Supple, symmetrical, trachea midline, no adenopathy;    thyroid:  no enlargement/tenderness/nodules; no carotid   bruit or JVD  Back:     Symmetric, no curvature, ROM normal, no CVA tenderness  Lungs:     Clear  to auscultation bilaterally, respirations unlabored  Chest Wall:    No tenderness or deformity   Heart:    Regular rate and rhythm, S1 and S2 normal, no murmur, rub   or gallop  Breast Exam:    No tenderness, masses, or nipple abnormality  Abdomen:     Soft, non-tender, bowel sounds active all four quadrants,    no masses, no organomegaly  Genitalia:    Pelvic: cervix surgically absent, external genitalia normal, no adnexal masses or tenderness,  rectovaginal septum normal, uterus surgically  absent and vagina normal without discharge  Extremities:   Extremities normal, atraumatic, no cyanosis or edema  Pulses:   2+ and symmetric all extremities  Skin:   Skin color, texture, turgor normal, no rashes or lesions  Lymph nodes:   Cervical, supraclavicular, and axillary nodes normal  Neurologic:   CNII-XII intact, normal strength, sensation and reflexes    throughout     No results found for: HGBA1C  Lab Results  Component Value Date   CREATININE 0.80 07/30/2015   CREATININE 0.74 01/19/2015   CREATININE 0.77 06/18/2014    Lab Results  Component Value Date   WBC 5.3 06/18/2014   HGB 13.1 06/18/2014   HCT 38.7 06/18/2014   PLT 205.0 06/18/2014   GLUCOSE 85 07/30/2015   CHOL 231 (H) 07/30/2015   TRIG 72.0 07/30/2015   HDL 61.00 07/30/2015   LDLCALC 156 (H) 07/30/2015   ALT 11 07/30/2015   AST 11 07/30/2015   NA 143 07/30/2015   K 4.0 07/30/2015   CL 104 07/30/2015   CREATININE 0.80 07/30/2015   BUN 20 07/30/2015   CO2 31 07/30/2015   TSH 5.14 (H) 07/30/2015    Assessment & Plan:   Problem List Items Addressed This Visit    Cystitis    Suggested by history, lack of findings on pelvic exam, and abnormal UA.  Empiric treatment with ciprofloxacin given the holiday . Urine culture confirms diagnosis and sensitivity to cipro.        Other Visit Diagnoses    Frequency of urination    -  Primary   Relevant Orders   POCT urinalysis dipstick (Completed)   Urinalysis, Routine  w reflex microscopic (Completed)   Urine culture (Completed)   Encounter for immunization       Relevant Orders   Flu vaccine HIGH DOSE PF (Completed)   Deep left inguinal pain          I have discontinued Ms. Selders's Red Yeast Rice Extract, meloxicam, traMADol, predniSONE, levofloxacin, and albuterol. I am also having her start on albuterol and ciprofloxacin. Additionally, I am having her maintain her OVER THE COUNTER MEDICATION, Fish Oil, calcium carbonate, ibuprofen, pantoprazole, famotidine, and citalopram.  Meds ordered this encounter  Medications  . albuterol (PROVENTIL HFA;VENTOLIN HFA) 108 (90 Base) MCG/ACT inhaler    Sig: Inhale 2 puffs into the lungs every 6 (six) hours as needed for wheezing or shortness of breath.    Dispense:  1 Inhaler    Refill:  11    pharmacy please fill as generic ventolin hfa per insruance mandfate  . ciprofloxacin (CIPRO) 250 MG tablet    Sig: Take 1 tablet (250 mg total) by mouth 2 (two) times daily.    Dispense:  10 tablet    Refill:  0    Medications Discontinued During This Encounter  Medication Reason  . traMADol (ULTRAM) 50 MG tablet Patient has not taken in last 30 days  . Red Yeast Rice Extract 600 MG CAPS Patient has not taken in last 30 days  . predniSONE (DELTASONE) 10 MG tablet Patient has not taken in last 30 days  . meloxicam (MOBIC) 15 MG tablet Patient has not taken in last 30 days  . levofloxacin (LEVAQUIN) 500 MG tablet Patient has not taken in last 30 days  . albuterol (PROVENTIL HFA;VENTOLIN HFA) 108 (90 Base) MCG/ACT inhaler     Follow-up: No Follow-up on file.   Crecencio Mc, MD

## 2016-04-22 NOTE — Patient Instructions (Signed)
You may have a UTI,  I will treat you based on the abnormal dipstick since the holiday is coming

## 2016-04-22 NOTE — Progress Notes (Signed)
Pre visit review using our clinic review tool, if applicable. No additional management support is needed unless otherwise documented below in the visit note. 

## 2016-04-24 DIAGNOSIS — N3090 Cystitis, unspecified without hematuria: Secondary | ICD-10-CM | POA: Insufficient documentation

## 2016-04-24 NOTE — Assessment & Plan Note (Addendum)
Suggested by history, lack of findings on pelvic exam, and abnormal UA.  Empiric treatment with ciprofloxacin given the holiday . Urine culture confirms diagnosis and sensitivity to cipro.

## 2016-04-25 LAB — URINE CULTURE

## 2016-04-27 ENCOUNTER — Telehealth: Payer: Self-pay | Admitting: *Deleted

## 2016-04-27 DIAGNOSIS — R300 Dysuria: Secondary | ICD-10-CM

## 2016-04-27 NOTE — Telephone Encounter (Signed)
Pt requested lab results  Pt contact [phone]

## 2016-04-28 NOTE — Telephone Encounter (Signed)
Reason for call:still  burning, not having urinary frequency as much, no fever Symptoms:Duration;  today Medications: took last ciprofloxacin yesterday Last seen for this problem: 04/22/16 Seen by: Dr Derrel Nip

## 2016-04-28 NOTE — Telephone Encounter (Signed)
Please confirm that you gave patient results about the UTI on Jan 3 because I have a message from Jan 4 from patient saying she is still having symptoms.  She will need to provide another urine sample if she is still having burning

## 2016-04-29 ENCOUNTER — Other Ambulatory Visit (INDEPENDENT_AMBULATORY_CARE_PROVIDER_SITE_OTHER): Payer: Medicare Other

## 2016-04-29 DIAGNOSIS — R300 Dysuria: Secondary | ICD-10-CM

## 2016-04-29 LAB — POC URINALSYSI DIPSTICK (AUTOMATED)
BILIRUBIN UA: NEGATIVE
Blood, UA: NEGATIVE
Glucose, UA: NEGATIVE
KETONES UA: NEGATIVE
Nitrite, UA: NEGATIVE
Protein, UA: NEGATIVE
Spec Grav, UA: 1.015
Urobilinogen, UA: 0.2
pH, UA: 8.5

## 2016-04-29 NOTE — Telephone Encounter (Signed)
Patient was given lab results on 04/28/15 and again  On 04/29/15.  She states she is still having dysuria.  She is coming today to leave urine specimen.

## 2016-05-01 LAB — URINE CULTURE: Organism ID, Bacteria: NO GROWTH

## 2016-05-04 ENCOUNTER — Telehealth: Payer: Self-pay | Admitting: *Deleted

## 2016-05-04 NOTE — Telephone Encounter (Signed)
LMOM about patient having enough refills for the year. She may want to call pharmacy to confirm

## 2016-05-04 NOTE — Telephone Encounter (Signed)
Patient has requested a medication refill for ventolin Pharmacy [facility]

## 2016-05-05 ENCOUNTER — Other Ambulatory Visit: Payer: Self-pay

## 2016-05-05 ENCOUNTER — Other Ambulatory Visit: Payer: Self-pay | Admitting: Internal Medicine

## 2016-05-05 NOTE — Telephone Encounter (Signed)
Patient medication was denied by insurance [employer] medicare  due to patient not trying other methods. Per Dr. Derrel Nip albuterol is the same thing. To file a claim you can contact their claims department  At [phone] or fax in claim sheet with a cover sheet and member Id, D.O.B, stating why she need this medication. Please advise

## 2016-05-05 NOTE — Telephone Encounter (Signed)
Left message for pharmacy to give me a call back. May except Pro-air

## 2016-05-05 NOTE — Telephone Encounter (Signed)
Please call pt at [phone] , pt had concerns about her insurance not coving the medication

## 2016-05-09 NOTE — Telephone Encounter (Signed)
Unable to speak to pharmacy may be closed Due to [facility] day. Will have to call in [employer] to pharmacy

## 2016-05-10 NOTE — Telephone Encounter (Signed)
Sarah [name] from [facility] called back 336 [phone].

## 2016-05-10 NOTE — Telephone Encounter (Signed)
called left message for pharmacy to call office concerning patient medication no answer.

## 2016-05-10 NOTE — Telephone Encounter (Signed)
Called left message for pharmacy. 2nd attempt. Please advise

## 2016-05-19 NOTE — Telephone Encounter (Signed)
Patient to call pharmacy.

## 2016-06-13 ENCOUNTER — Other Ambulatory Visit: Payer: Self-pay | Admitting: *Deleted

## 2016-06-13 MED ORDER — FAMOTIDINE 20 MG PO TABS: ORAL_TABLET | ORAL | 11 refills | Status: DC

## 2016-06-13 MED ORDER — PANTOPRAZOLE SODIUM 40 MG PO TBEC: DELAYED_RELEASE_TABLET | ORAL | 11 refills | Status: DC

## 2016-06-13 NOTE — Telephone Encounter (Signed)
Okay to refill? 

## 2016-06-14 MED ORDER — CITALOPRAM HYDROBROMIDE 20 MG PO TABS: 20.0000 mg | ORAL_TABLET | Freq: Every day | ORAL | 11 refills | Status: DC

## 2016-06-14 NOTE — Telephone Encounter (Signed)
REFILLED

## 2016-08-24 DIAGNOSIS — H2511 Age-related nuclear cataract, right eye: Secondary | ICD-10-CM | POA: Diagnosis not present

## 2016-09-02 DIAGNOSIS — H2511 Age-related nuclear cataract, right eye: Secondary | ICD-10-CM | POA: Diagnosis not present

## 2016-09-13 ENCOUNTER — Ambulatory Visit: Payer: Medicare Other | Admitting: Anesthesiology

## 2016-09-13 ENCOUNTER — Ambulatory Visit
Admission: RE | Admit: 2016-09-13 | Discharge: 2016-09-13 | Disposition: A | Discharge status: Home / Self Care | Payer: Medicare Other | Source: Ambulatory Visit | Attending: Ophthalmology | Admitting: Ophthalmology

## 2016-09-13 ENCOUNTER — Encounter
Admission: RE | Disposition: A | Discharge status: Home / Self Care | Payer: Self-pay | Source: Ambulatory Visit | Attending: Ophthalmology

## 2016-09-13 DIAGNOSIS — Z79899 Other long term (current) drug therapy: Secondary | ICD-10-CM | POA: Insufficient documentation

## 2016-09-13 DIAGNOSIS — H2511 Age-related nuclear cataract, right eye: Secondary | ICD-10-CM | POA: Diagnosis not present

## 2016-09-13 DIAGNOSIS — J45909 Unspecified asthma, uncomplicated: Secondary | ICD-10-CM | POA: Insufficient documentation

## 2016-09-13 DIAGNOSIS — Z791 Long term (current) use of non-steroidal anti-inflammatories (NSAID): Secondary | ICD-10-CM | POA: Insufficient documentation

## 2016-09-13 DIAGNOSIS — F329 Major depressive disorder, single episode, unspecified: Secondary | ICD-10-CM | POA: Diagnosis not present

## 2016-09-13 DIAGNOSIS — K219 Gastro-esophageal reflux disease without esophagitis: Secondary | ICD-10-CM | POA: Diagnosis not present

## 2016-09-13 HISTORY — PX: CATARACT EXTRACTION W/PHACO: SHX586

## 2016-09-13 HISTORY — DX: Dyspnea, unspecified: R06.00

## 2016-09-13 HISTORY — DX: Gastro-esophageal reflux disease without esophagitis: K21.9

## 2016-09-13 HISTORY — DX: Unspecified osteoarthritis, unspecified site: M19.90

## 2016-09-13 SURGERY — PHACOEMULSIFICATION, CATARACT, WITH IOL INSERTION
Anesthesia: Monitor Anesthesia Care | Site: Eye | Laterality: Right | Wound class: Clean

## 2016-09-13 MED ORDER — MOXIFLOXACIN HCL 0.5 % OP SOLN
OPHTHALMIC | Status: DC | PRN
  Administered 2016-09-13: .2 mL via OPHTHALMIC

## 2016-09-13 MED ORDER — EPINEPHRINE PF 1 MG/ML IJ SOLN
INTRAOCULAR | Status: DC | PRN
  Administered 2016-09-13: 1 mL via OPHTHALMIC

## 2016-09-13 MED ORDER — MIDAZOLAM HCL 2 MG/2ML IJ SOLN
INTRAMUSCULAR | Status: AC
  Filled 2016-09-13: qty 2

## 2016-09-13 MED ORDER — LIDOCAINE HCL (PF) 2 % IJ SOLN
INTRAMUSCULAR | Status: AC
  Filled 2016-09-13: qty 2

## 2016-09-13 MED ORDER — NA CHONDROIT SULF-NA HYALURON 40-17 MG/ML IO SOLN
INTRAOCULAR | Status: AC
  Filled 2016-09-13: qty 1

## 2016-09-13 MED ORDER — MOXIFLOXACIN HCL 0.5 % OP SOLN
OPHTHALMIC | Status: AC
  Filled 2016-09-13: qty 3

## 2016-09-13 MED ORDER — POVIDONE-IODINE 5 % OP SOLN
OPHTHALMIC | Status: DC | PRN
  Administered 2016-09-13: 1 via OPHTHALMIC

## 2016-09-13 MED ORDER — NA CHONDROIT SULF-NA HYALURON 40-17 MG/ML IO SOLN
INTRAOCULAR | Status: DC | PRN
  Administered 2016-09-13: 1 mL via INTRAOCULAR

## 2016-09-13 MED ORDER — ARMC OPHTHALMIC DILATING DROPS
1.0000 "application " | OPHTHALMIC | Status: DC | PRN
  Administered 2016-09-13 (×3): 1 via OPHTHALMIC

## 2016-09-13 MED ORDER — LIDOCAINE HCL (PF) 4 % IJ SOLN
INTRAOCULAR | Status: DC | PRN
  Administered 2016-09-13: 2 mL via OPHTHALMIC

## 2016-09-13 MED ORDER — EPINEPHRINE PF 1 MG/ML IJ SOLN
INTRAMUSCULAR | Status: AC
  Filled 2016-09-13: qty 2

## 2016-09-13 MED ORDER — MOXIFLOXACIN HCL 0.5 % OP SOLN: 1.0000 [drp] | OPHTHALMIC | Status: DC | PRN

## 2016-09-13 MED ORDER — SODIUM CHLORIDE 0.9 % IV SOLN
INTRAVENOUS | Status: DC
  Administered 2016-09-13 (×2): via INTRAVENOUS

## 2016-09-13 MED ORDER — MIDAZOLAM HCL 2 MG/2ML IJ SOLN
INTRAMUSCULAR | Status: DC | PRN
  Administered 2016-09-13: 1 mg via INTRAVENOUS

## 2016-09-13 MED ORDER — ARMC OPHTHALMIC DILATING DROPS
OPHTHALMIC | Status: AC
  Administered 2016-09-13: 1 via OPHTHALMIC
  Filled 2016-09-13: qty 0.4

## 2016-09-13 MED ORDER — FENTANYL CITRATE (PF) 100 MCG/2ML IJ SOLN
INTRAMUSCULAR | Status: AC
  Filled 2016-09-13: qty 2

## 2016-09-13 MED ORDER — CARBACHOL 0.01 % IO SOLN
INTRAOCULAR | Status: DC | PRN
  Administered 2016-09-13: .5 mL via INTRAOCULAR

## 2016-09-13 MED ORDER — FENTANYL CITRATE (PF) 100 MCG/2ML IJ SOLN
INTRAMUSCULAR | Status: DC | PRN
  Administered 2016-09-13: 50 ug via INTRAVENOUS

## 2016-09-13 MED ORDER — POVIDONE-IODINE 5 % OP SOLN
OPHTHALMIC | Status: AC
  Filled 2016-09-13: qty 30

## 2016-09-13 SURGICAL SUPPLY — 14 items
GLOVE BIO SURGEON STRL SZ8 (GLOVE) ×2 IMPLANT
GLOVE BIOGEL M 6.5 STRL (GLOVE) ×2 IMPLANT
GLOVE SURG LX 8.0 MICRO (GLOVE) ×1
GLOVE SURG LX STRL 8.0 MICRO (GLOVE) ×1 IMPLANT
GOWN STRL REUS W/ TWL LRG LVL3 (GOWN DISPOSABLE) ×2 IMPLANT
GOWN STRL REUS W/TWL LRG LVL3 (GOWN DISPOSABLE) ×2
LENS IOL TECNIS ITEC 23.0 (Intraocular Lens) ×2 IMPLANT
PACK CATARACT (MISCELLANEOUS) ×2 IMPLANT
PACK CATARACT BRASINGTON LX (MISCELLANEOUS) ×2 IMPLANT
SOL BSS BAG (MISCELLANEOUS) ×2
SOLUTION BSS BAG (MISCELLANEOUS) ×1 IMPLANT
SYR 5ML LL (SYRINGE) ×2 IMPLANT
WATER STERILE IRR 250ML POUR (IV SOLUTION) ×2 IMPLANT
WIPE NON LINTING 3.25X3.25 (MISCELLANEOUS) ×2 IMPLANT

## 2016-09-13 NOTE — Anesthesia Postprocedure Evaluation (Signed)
Anesthesia Post Note  Patient: Sarah [name]  Procedure(s) Performed: Procedure(s) (LRB): CATARACT EXTRACTION PHACO AND INTRAOCULAR LENS PLACEMENT (IOC) (Right)  Anesthesia Type: MAC Level of consciousness: awake, awake and alert and oriented Pain management: pain level controlled Vital Signs Assessment: post-procedure vital signs reviewed and stable Respiratory status: spontaneous breathing, nonlabored ventilation and respiratory function stable Cardiovascular status: stable Anesthetic complications: no     Last Vitals:  Vitals:   09/13/16 1007  BP: (!) 141/73  Pulse: 70  Resp: 16    Last Pain:  Vitals:   09/13/16 1007  TempSrc: Tympanic                 Josilyn Shippee,  Birdia Jaycox R

## 2016-09-13 NOTE — Op Note (Signed)
PREOPERATIVE DIAGNOSIS:  Nuclear sclerotic cataract of the right eye.   POSTOPERATIVE DIAGNOSIS:  nuclear sclerotic cataract right eye   OPERATIVE PROCEDURE: Procedure(s): CATARACT EXTRACTION PHACO AND INTRAOCULAR LENS PLACEMENT (IOC)   SURGEON:  Birder Robson, MD.   ANESTHESIA:  Anesthesiologist: Emmie Niemann, MD CRNA: Nelda Marseille, CRNA  1.      Managed anesthesia care. 2.      0.51ml of Shugarcaine was instilled in the eye following the paracentesis.   COMPLICATIONS:  None.   TECHNIQUE:   Stop and chop   DESCRIPTION OF PROCEDURE:  The patient was examined and consented in the preoperative holding area where the aforementioned topical anesthesia was applied to the right eye and then brought back to the Operating Room where the right eye was prepped and draped in the usual sterile ophthalmic fashion and a lid speculum was placed. A paracentesis was created with the side port blade and the anterior chamber was filled with viscoelastic. A near clear corneal incision was performed with the steel keratome. A continuous curvilinear capsulorrhexis was performed with a cystotome followed by the capsulorrhexis forceps. Hydrodissection and hydrodelineation were carried out with BSS on a blunt cannula. The lens was removed in a stop and chop  technique and the remaining cortical material was removed with the irrigation-aspiration handpiece. The capsular bag was inflated with viscoelastic and the Technis ZCB00  lens was placed in the capsular bag without complication. The remaining viscoelastic was removed from the eye with the irrigation-aspiration handpiece. The wounds were hydrated. The anterior chamber was flushed with Miostat and the eye was inflated to physiologic pressure. 0.4ml of Vigamox was placed in the anterior chamber. The wounds were found to be water tight. The eye was dressed with Vigamox. The patient was given protective glasses to wear throughout the day and a shield with which to  sleep tonight. The patient was also given drops with which to begin a drop regimen today and will follow-up with me in one day.  Implant Name Type Inv. Item Serial No. Manufacturer Lot No. LRB No. Used  LENS IOL DIOP 23.0 - [id number] Intraocular Lens LENS IOL DIOP 23.0 [id number]   Right 1   Procedure(s) with comments: CATARACT EXTRACTION PHACO AND INTRAOCULAR LENS PLACEMENT (IOC) (Right) - [place] 00:38 AP% 14.3 CDE 5.51 Fluid pack lot # [id number]  Electronically signed: [place] 09/13/2016 11:50 AM

## 2016-09-13 NOTE — Anesthesia Post-op Follow-up Note (Cosign Needed)
Anesthesia QCDR form completed.        

## 2016-09-13 NOTE — Discharge Instructions (Signed)
Eye Surgery Discharge Instructions  Expect mild scratchy sensation or mild soreness. DO NOT RUB YOUR EYE!  The day of surgery:  Minimal physical activity, but bed rest is not required  No reading, computer work, or close hand work  No bending, lifting, or straining.  May watch TV  For 24 hours:  No driving, legal decisions, or alcoholic beverages  Safety precautions  Eat anything you prefer: It is better to start with liquids, then soup then solid foods.  _____ Eye patch should be worn until postoperative exam tomorrow.  ____ Solar shield eyeglasses should be worn for comfort in the sunlight/patch while sleeping  Resume all regular medications including aspirin or Coumadin if these were discontinued prior to surgery. You may shower, bathe, shave, or wash your hair. Tylenol may be taken for mild discomfort.  Call your doctor if you experience significant pain, nausea, or vomiting, fever > 101 or other signs of infection. [phone] or [phone] Specific instructions:  Follow-up Information    Birder Robson, MD Follow up on 09/14/2016.   Specialty:  Ophthalmology Why:  8:10 Contact information: [address] [place] [address] [phone]

## 2016-09-13 NOTE — H&P (Signed)
All labs reviewed. Abnormal studies sent to patients PCP when indicated.  Previous H&P reviewed, patient examined, there are NO CHANGES.  Sarah [name] LOUIS5/22/201811:23 AM

## 2016-09-13 NOTE — Anesthesia Procedure Notes (Signed)
Date/Time: 09/13/2016 11:38 AM Performed by: Nelda Marseille Pre-anesthesia Checklist: Patient identified, Emergency Drugs available, Suction available, Patient being monitored and Timeout performed Oxygen Delivery Method: Nasal cannula

## 2016-09-13 NOTE — Transfer of Care (Signed)
Immediate Anesthesia Transfer of Care Note  Patient: Sarah [name]  Procedure(s) Performed: Procedure(s) with comments: CATARACT EXTRACTION PHACO AND INTRAOCULAR LENS PLACEMENT (IOC) (Right) - [place] 00:38 AP% 14.3 CDE 5.51 Fluid pack lot # [id number]  Patient Location: PACU  Anesthesia Type:MAC  Level of Consciousness: awake, alert  and oriented  Airway & Oxygen Therapy: Patient Spontanous Breathing  Post-op Assessment: Report given to RN and Post -op Vital signs reviewed and stable  Post vital signs: Reviewed and stable  Last Vitals:  Vitals:   09/13/16 1007  BP: (!) 141/73  Pulse: 70  Resp: 16    Last Pain:  Vitals:   09/13/16 1007  TempSrc: Tympanic         Complications: No apparent anesthesia complications

## 2016-09-13 NOTE — Anesthesia Preprocedure Evaluation (Signed)
Anesthesia Evaluation  Patient identified by MRN, date of birth, ID band Patient awake    Reviewed: Allergy & Precautions, NPO status , Patient's Chart, lab work & pertinent test results  History of Anesthesia Complications Negative for: history of anesthetic complications  Airway Mallampati: II  TM Distance: >3 FB Neck ROM: Full    Dental  (+) Implants   Pulmonary asthma , neg sleep apnea,    breath sounds clear to auscultation- rhonchi (-) wheezing      Cardiovascular Exercise Tolerance: Good (-) hypertension(-) CAD and (-) Past MI  Rhythm:Regular Rate:Normal - Systolic murmurs and - Diastolic murmurs    Neuro/Psych PSYCHIATRIC DISORDERS Depression negative neurological ROS     GI/Hepatic Neg liver ROS, GERD  ,  Endo/Other  negative endocrine ROSneg diabetes  Renal/GU negative Renal ROS     Musculoskeletal  (+) Arthritis ,   Abdominal (+) - obese,   Peds  Hematology negative hematology ROS (+)   Anesthesia Other Findings Past Medical History: No date: Arthritis No date: Asthma No date: Depression No date: Dyspnea     Comment: with heat No date: GERD (gastroesophageal reflux disease)   Reproductive/Obstetrics                             Anesthesia Physical Anesthesia Plan  ASA: II  Anesthesia Plan: MAC   Post-op Pain Management:    Induction: Intravenous  Airway Management Planned: Natural Airway  Additional Equipment:   Intra-op Plan:   Post-operative Plan:   Informed Consent: I have reviewed the patients History and Physical, chart, labs and discussed the procedure including the risks, benefits and alternatives for the proposed anesthesia with the patient or authorized representative who has indicated his/her understanding and acceptance.     Plan Discussed with: CRNA and Anesthesiologist  Anesthesia Plan Comments:         Anesthesia Quick Evaluation

## 2016-09-27 DIAGNOSIS — H2512 Age-related nuclear cataract, left eye: Secondary | ICD-10-CM | POA: Diagnosis not present

## 2016-09-29 ENCOUNTER — Encounter: Payer: Self-pay | Admitting: *Deleted

## 2016-10-04 ENCOUNTER — Ambulatory Visit: Payer: Medicare Other | Admitting: Anesthesiology

## 2016-10-04 ENCOUNTER — Encounter
Admission: RE | Disposition: A | Discharge status: Home / Self Care | Payer: Self-pay | Source: Ambulatory Visit | Attending: Ophthalmology

## 2016-10-04 ENCOUNTER — Ambulatory Visit
Admission: RE | Admit: 2016-10-04 | Discharge: 2016-10-04 | Disposition: A | Discharge status: Home / Self Care | Payer: Medicare Other | Source: Ambulatory Visit | Attending: Ophthalmology | Admitting: Ophthalmology

## 2016-10-04 DIAGNOSIS — F329 Major depressive disorder, single episode, unspecified: Secondary | ICD-10-CM | POA: Diagnosis not present

## 2016-10-04 DIAGNOSIS — K219 Gastro-esophageal reflux disease without esophagitis: Secondary | ICD-10-CM | POA: Diagnosis not present

## 2016-10-04 DIAGNOSIS — Z538 Procedure and treatment not carried out for other reasons: Secondary | ICD-10-CM | POA: Diagnosis not present

## 2016-10-04 DIAGNOSIS — H269 Unspecified cataract: Secondary | ICD-10-CM | POA: Diagnosis not present

## 2016-10-04 DIAGNOSIS — H2511 Age-related nuclear cataract, right eye: Secondary | ICD-10-CM | POA: Insufficient documentation

## 2016-10-04 DIAGNOSIS — J45909 Unspecified asthma, uncomplicated: Secondary | ICD-10-CM | POA: Insufficient documentation

## 2016-10-04 DIAGNOSIS — M1990 Unspecified osteoarthritis, unspecified site: Secondary | ICD-10-CM | POA: Insufficient documentation

## 2016-10-04 HISTORY — DX: Other allergic rhinitis: J30.89

## 2016-10-04 HISTORY — DX: Wheezing: R06.2

## 2016-10-04 SURGERY — CANCELLED PROCEDURE
Anesthesia: Monitor Anesthesia Care | Laterality: Left

## 2016-10-04 MED ORDER — SODIUM CHLORIDE 0.9 % IV SOLN
INTRAVENOUS | Status: DC
  Administered 2016-10-04: 11:00:00 via INTRAVENOUS

## 2016-10-04 MED ORDER — FENTANYL CITRATE (PF) 100 MCG/2ML IJ SOLN
INTRAMUSCULAR | Status: AC
  Filled 2016-10-04: qty 2

## 2016-10-04 MED ORDER — ARMC OPHTHALMIC DILATING DROPS
1.0000 "application " | OPHTHALMIC | Status: AC | PRN
  Administered 2016-10-04 (×3): 1 via OPHTHALMIC

## 2016-10-04 MED ORDER — MOXIFLOXACIN HCL 0.5 % OP SOLN: 1.0000 [drp] | OPHTHALMIC | Status: DC | PRN

## 2016-10-04 MED ORDER — ARMC OPHTHALMIC DILATING DROPS
OPHTHALMIC | Status: AC
  Administered 2016-10-04: 1 via OPHTHALMIC
  Filled 2016-10-04: qty 0.4

## 2016-10-04 MED ORDER — MOXIFLOXACIN HCL 0.5 % OP SOLN
OPHTHALMIC | Status: AC
  Filled 2016-10-04: qty 3

## 2016-10-04 SURGICAL SUPPLY — 13 items
GLOVE BIO SURGEON STRL SZ8 (GLOVE) IMPLANT
GLOVE BIOGEL M 6.5 STRL (GLOVE) IMPLANT
GLOVE SURG LX 8.0 MICRO (GLOVE)
GLOVE SURG LX STRL 8.0 MICRO (GLOVE) IMPLANT
GOWN STRL REUS W/ TWL LRG LVL3 (GOWN DISPOSABLE) IMPLANT
GOWN STRL REUS W/TWL LRG LVL3 (GOWN DISPOSABLE)
PACK CATARACT (MISCELLANEOUS) IMPLANT
PACK CATARACT BRASINGTON LX (MISCELLANEOUS) IMPLANT
SOL BSS BAG (MISCELLANEOUS)
SOLUTION BSS BAG (MISCELLANEOUS) IMPLANT
SYR 5ML LL (SYRINGE) IMPLANT
WATER STERILE IRR 250ML POUR (IV SOLUTION) IMPLANT
WIPE NON LINTING 3.25X3.25 (MISCELLANEOUS) IMPLANT

## 2016-10-04 NOTE — H&P (Signed)
All labs reviewed. Abnormal studies sent to patients PCP when indicated.  Previous H&P reviewed, patient examined, there are NO CHANGES.  Sarah [name] LOUIS6/12/201812:22 PM

## 2016-10-04 NOTE — OR Nursing (Signed)
Patient was given Vigamox gtts by Livia Snellen, RN and told her Dr. [name] advised for her to keep them until she needed them later

## 2016-10-04 NOTE — Anesthesia Preprocedure Evaluation (Signed)
Anesthesia Evaluation  Patient identified by MRN, date of birth, ID band Patient awake    Reviewed: Allergy & Precautions, NPO status , Patient's Chart, lab work & pertinent test results  History of Anesthesia Complications Negative for: history of anesthetic complications  Airway Mallampati: II       Dental   Pulmonary asthma ,           Cardiovascular negative cardio ROS       Neuro/Psych Depression negative neurological ROS     GI/Hepatic Neg liver ROS, GERD  Medicated,  Endo/Other  negative endocrine ROS  Renal/GU negative Renal ROS     Musculoskeletal   Abdominal   Peds  Hematology   Anesthesia Other Findings   Reproductive/Obstetrics                             Anesthesia Physical Anesthesia Plan  ASA: II  Anesthesia Plan: MAC   Post-op Pain Management:    Induction:   PONV Risk Score and Plan:   Airway Management Planned:   Additional Equipment:   Intra-op Plan:   Post-operative Plan:   Informed Consent: I have reviewed the patients History and Physical, chart, labs and discussed the procedure including the risks, benefits and alternatives for the proposed anesthesia with the patient or authorized representative who has indicated his/her understanding and acceptance.     Plan Discussed with:   Anesthesia Plan Comments:         Anesthesia Quick Evaluation

## 2016-10-04 NOTE — OR Nursing (Signed)
Returned from [facility] room - staff advises procedure was not performed due to machine malfunction.  CRNA advises patient did not receive any meds in OR; IV d/c'd and pt left for home with friend [place].

## 2016-10-04 NOTE — Discharge Instructions (Signed)
Eye Surgery Discharge Instructions  Expect mild scratchy sensation or mild soreness. DO NOT RUB YOUR EYE!  The day of surgery:  Minimal physical activity, but bed rest is not required  No reading, computer work, or close hand work  No bending, lifting, or straining.  May watch TV  For 24 hours:  No driving, legal decisions, or alcoholic beverages  Safety precautions  Eat anything you prefer: It is better to start with liquids, then soup then solid foods.  _____ Eye patch should be worn until postoperative exam tomorrow.  ____ Solar shield eyeglasses should be worn for comfort in the sunlight/patch while sleeping  Resume all regular medications including aspirin or Coumadin if these were discontinued prior to surgery. You may shower, bathe, shave, or wash your hair. Tylenol may be taken for mild discomfort.  Call your doctor if you experience significant pain, nausea, or vomiting, fever > 101 or other signs of infection. [phone] or [phone] Specific instructions:  Follow-up Information    Birder Robson, MD Follow up.   Specialty:  Ophthalmology Why:  June 13 at 10:35am Contact information: [address] [place] [address] [phone]

## 2016-10-06 ENCOUNTER — Encounter: Payer: Self-pay | Admitting: *Deleted

## 2016-10-11 ENCOUNTER — Ambulatory Visit: Payer: Medicare Other | Admitting: Anesthesiology

## 2016-10-11 ENCOUNTER — Ambulatory Visit
Admission: RE | Admit: 2016-10-11 | Discharge: 2016-10-11 | Disposition: A | Discharge status: Home / Self Care | Payer: Medicare Other | Source: Ambulatory Visit | Attending: Ophthalmology | Admitting: Ophthalmology

## 2016-10-11 ENCOUNTER — Encounter
Admission: RE | Disposition: A | Discharge status: Home / Self Care | Payer: Self-pay | Source: Ambulatory Visit | Attending: Ophthalmology

## 2016-10-11 ENCOUNTER — Encounter: Payer: Self-pay | Admitting: *Deleted

## 2016-10-11 DIAGNOSIS — Z90710 Acquired absence of both cervix and uterus: Secondary | ICD-10-CM | POA: Diagnosis not present

## 2016-10-11 DIAGNOSIS — M1990 Unspecified osteoarthritis, unspecified site: Secondary | ICD-10-CM | POA: Diagnosis not present

## 2016-10-11 DIAGNOSIS — K219 Gastro-esophageal reflux disease without esophagitis: Secondary | ICD-10-CM | POA: Insufficient documentation

## 2016-10-11 DIAGNOSIS — H2512 Age-related nuclear cataract, left eye: Secondary | ICD-10-CM | POA: Insufficient documentation

## 2016-10-11 DIAGNOSIS — J45909 Unspecified asthma, uncomplicated: Secondary | ICD-10-CM | POA: Diagnosis not present

## 2016-10-11 DIAGNOSIS — F329 Major depressive disorder, single episode, unspecified: Secondary | ICD-10-CM | POA: Diagnosis not present

## 2016-10-11 HISTORY — PX: CATARACT EXTRACTION W/PHACO: SHX586

## 2016-10-11 SURGERY — PHACOEMULSIFICATION, CATARACT, WITH IOL INSERTION
Anesthesia: Monitor Anesthesia Care | Site: Eye | Laterality: Left | Wound class: Clean

## 2016-10-11 MED ORDER — EPINEPHRINE PF 1 MG/ML IJ SOLN
INTRAMUSCULAR | Status: AC
  Filled 2016-10-11: qty 2

## 2016-10-11 MED ORDER — MIDAZOLAM HCL 2 MG/2ML IJ SOLN
INTRAMUSCULAR | Status: DC | PRN
  Administered 2016-10-11: 1 mg via INTRAVENOUS

## 2016-10-11 MED ORDER — ONDANSETRON HCL 4 MG/2ML IJ SOLN: 4.0000 mg | Freq: Once | INTRAMUSCULAR | Status: DC | PRN

## 2016-10-11 MED ORDER — FENTANYL CITRATE (PF) 100 MCG/2ML IJ SOLN: 25.0000 ug | INTRAMUSCULAR | Status: DC | PRN

## 2016-10-11 MED ORDER — NA CHONDROIT SULF-NA HYALURON 40-17 MG/ML IO SOLN
INTRAOCULAR | Status: AC
  Filled 2016-10-11: qty 1

## 2016-10-11 MED ORDER — POVIDONE-IODINE 5 % OP SOLN
OPHTHALMIC | Status: DC | PRN
  Administered 2016-10-11: 1 via OPHTHALMIC

## 2016-10-11 MED ORDER — MIDAZOLAM HCL 2 MG/2ML IJ SOLN
INTRAMUSCULAR | Status: AC
  Filled 2016-10-11: qty 2

## 2016-10-11 MED ORDER — CARBACHOL 0.01 % IO SOLN
INTRAOCULAR | Status: DC | PRN
  Administered 2016-10-11: .5 mL via INTRAOCULAR

## 2016-10-11 MED ORDER — MOXIFLOXACIN HCL 0.5 % OP SOLN
OPHTHALMIC | Status: DC | PRN
  Administered 2016-10-11: .2 mL via OPHTHALMIC

## 2016-10-11 MED ORDER — SODIUM CHLORIDE 0.9 % IV SOLN
INTRAVENOUS | Status: DC
  Administered 2016-10-11 (×2): via INTRAVENOUS

## 2016-10-11 MED ORDER — LIDOCAINE HCL (PF) 4 % IJ SOLN
INTRAOCULAR | Status: DC | PRN
  Administered 2016-10-11: 2 mL via OPHTHALMIC

## 2016-10-11 MED ORDER — NA CHONDROIT SULF-NA HYALURON 40-17 MG/ML IO SOLN
INTRAOCULAR | Status: DC | PRN
  Administered 2016-10-11: 1 mL via INTRAOCULAR

## 2016-10-11 MED ORDER — ARMC OPHTHALMIC DILATING DROPS
1.0000 "application " | OPHTHALMIC | Status: AC
  Administered 2016-10-11 (×3): 1 via OPHTHALMIC

## 2016-10-11 MED ORDER — POVIDONE-IODINE 5 % OP SOLN
OPHTHALMIC | Status: AC
  Filled 2016-10-11: qty 30

## 2016-10-11 MED ORDER — MOXIFLOXACIN HCL 0.5 % OP SOLN: 1.0000 [drp] | OPHTHALMIC | Status: DC | PRN

## 2016-10-11 MED ORDER — EPINEPHRINE PF 1 MG/ML IJ SOLN
INTRAOCULAR | Status: DC | PRN
  Administered 2016-10-11: 09:00:00 via OPHTHALMIC

## 2016-10-11 MED ORDER — LIDOCAINE HCL (PF) 4 % IJ SOLN
INTRAMUSCULAR | Status: AC
  Filled 2016-10-11: qty 5

## 2016-10-11 SURGICAL SUPPLY — 14 items
GLOVE BIO SURGEON STRL SZ8 (GLOVE) ×2 IMPLANT
GLOVE BIOGEL M 6.5 STRL (GLOVE) ×2 IMPLANT
GLOVE SURG LX 8.0 MICRO (GLOVE) ×1
GLOVE SURG LX STRL 8.0 MICRO (GLOVE) ×1 IMPLANT
GOWN STRL REUS W/ TWL LRG LVL3 (GOWN DISPOSABLE) ×2 IMPLANT
GOWN STRL REUS W/TWL LRG LVL3 (GOWN DISPOSABLE) ×2
LENS IOL TECNIS ITEC 22.5 (Intraocular Lens) ×2 IMPLANT
PACK CATARACT (MISCELLANEOUS) ×2 IMPLANT
PACK CATARACT BRASINGTON LX (MISCELLANEOUS) ×2 IMPLANT
SOL BSS BAG (MISCELLANEOUS) ×2
SOLUTION BSS BAG (MISCELLANEOUS) ×1 IMPLANT
SYR 5ML LL (SYRINGE) ×2 IMPLANT
WATER STERILE IRR 250ML POUR (IV SOLUTION) ×2 IMPLANT
WIPE NON LINTING 3.25X3.25 (MISCELLANEOUS) ×2 IMPLANT

## 2016-10-11 NOTE — Anesthesia Preprocedure Evaluation (Signed)
Anesthesia Evaluation  Patient identified by MRN, date of birth, ID band Patient awake    Reviewed: Allergy & Precautions, NPO status , Patient's Chart, lab work & pertinent test results  History of Anesthesia Complications Negative for: history of anesthetic complications  Airway Mallampati: II  TM Distance: >3 FB Neck ROM: Full    Dental  (+) Implants   Pulmonary shortness of breath and with exertion, asthma , neg sleep apnea,    breath sounds clear to auscultation- rhonchi (-) wheezing      Cardiovascular Exercise Tolerance: Good (-) hypertension(-) CAD and (-) Past MI negative cardio ROS   Rhythm:Regular Rate:Normal - Systolic murmurs and - Diastolic murmurs    Neuro/Psych PSYCHIATRIC DISORDERS Depression negative neurological ROS     GI/Hepatic Neg liver ROS, GERD  Medicated,  Endo/Other  negative endocrine ROSneg diabetes  Renal/GU negative Renal ROS  negative genitourinary   Musculoskeletal  (+) Arthritis , Osteoarthritis,    Abdominal (+) - obese,   Peds negative pediatric ROS (+)  Hematology negative hematology ROS (+)   Anesthesia Other Findings Past Medical History: No date: Arthritis No date: Asthma No date: Depression No date: Dyspnea     Comment: with heat No date: GERD (gastroesophageal reflux disease)   Reproductive/Obstetrics                             Anesthesia Physical  Anesthesia Plan  ASA: II  Anesthesia Plan: MAC   Post-op Pain Management:    Induction: Intravenous  PONV Risk Score and Plan:   Airway Management Planned: Nasal Cannula  Additional Equipment:   Intra-op Plan:   Post-operative Plan:   Informed Consent: I have reviewed the patients History and Physical, chart, labs and discussed the procedure including the risks, benefits and alternatives for the proposed anesthesia with the patient or authorized representative who has indicated  his/her understanding and acceptance.     Plan Discussed with: CRNA and Anesthesiologist  Anesthesia Plan Comments:         Anesthesia Quick Evaluation

## 2016-10-11 NOTE — Anesthesia Post-op Follow-up Note (Cosign Needed)
Anesthesia QCDR form completed.        

## 2016-10-11 NOTE — H&P (Signed)
All labs reviewed. Abnormal studies sent to patients PCP when indicated.  Previous H&P reviewed, patient examined, there are NO CHANGES.  Sarah [name] LOUIS6/19/20189:01 AM

## 2016-10-11 NOTE — Anesthesia Postprocedure Evaluation (Signed)
Anesthesia Post Note  Patient: Sarah [name]  Procedure(s) Performed: Procedure(s) (LRB): CATARACT EXTRACTION PHACO AND INTRAOCULAR LENS PLACEMENT (IOC) (Left)  Patient location during evaluation: PACU Anesthesia Type: MAC Level of consciousness: awake and alert and oriented Pain management: pain level controlled Vital Signs Assessment: post-procedure vital signs reviewed and stable Respiratory status: spontaneous breathing Cardiovascular status: blood pressure returned to baseline Anesthetic complications: no     Last Vitals:  Vitals:   10/11/16 0930 10/11/16 0935  BP: (!) 158/84 (!) 152/78  Pulse: 64   Resp: 16   Temp: 36.4 C     Last Pain:  Vitals:   10/11/16 0741  TempSrc: Oral                 Beatryce Colombo

## 2016-10-11 NOTE — Op Note (Signed)
PREOPERATIVE DIAGNOSIS:  Nuclear sclerotic cataract of the left eye.   POSTOPERATIVE DIAGNOSIS:  Nuclear sclerotic cataract of the left eye.   OPERATIVE PROCEDURE: Procedure(s): CATARACT EXTRACTION PHACO AND INTRAOCULAR LENS PLACEMENT (IOC)   SURGEON:  Birder Robson, MD.   ANESTHESIA:  Anesthesiologist: Alvin Critchley, MD CRNA: Justus Memory, CRNA  1.      Managed anesthesia care. 2.     0.61ml of Shugarcaine was instilled following the paracentesis   COMPLICATIONS:  None.   TECHNIQUE:   Stop and chop   DESCRIPTION OF PROCEDURE:  The patient was examined and consented in the preoperative holding area where the aforementioned topical anesthesia was applied to the left eye and then brought back to the Operating Room where the left eye was prepped and draped in the usual sterile ophthalmic fashion and a lid speculum was placed. A paracentesis was created with the side port blade and the anterior chamber was filled with viscoelastic. A near clear corneal incision was performed with the steel keratome. A continuous curvilinear capsulorrhexis was performed with a cystotome followed by the capsulorrhexis forceps. Hydrodissection and hydrodelineation were carried out with BSS on a blunt cannula. The lens was removed in a stop and chop  technique and the remaining cortical material was removed with the irrigation-aspiration handpiece. The capsular bag was inflated with viscoelastic and the Technis ZCB00 lens was placed in the capsular bag without complication. The remaining viscoelastic was removed from the eye with the irrigation-aspiration handpiece. The wounds were hydrated. The anterior chamber was flushed with Miostat and the eye was inflated to physiologic pressure. 0.9ml Vigamox was placed in the anterior chamber. The wounds were found to be water tight. The eye was dressed with Vigamox. The patient was given protective glasses to wear throughout the day and a shield with which to sleep  tonight. The patient was also given drops with which to begin a drop regimen today and will follow-up with me in one day.  Implant Name Type Inv. Item Serial No. Manufacturer Lot No. LRB No. Used  LENS IOL DIOP 22.5 - [id number] Intraocular Lens LENS IOL DIOP 22.5 [id number]   Left 1    Procedure(s) with comments: CATARACT EXTRACTION PHACO AND INTRAOCULAR LENS PLACEMENT (IOC) (Left) - [place] 00:30 AP% 11.3 CDE 3.50 fluid pack lot # [id number]  Electronically signed: [place] 10/11/2016 9:29 AM

## 2016-10-11 NOTE — Discharge Instructions (Signed)
Eye Surgery Discharge Instructions  Expect mild scratchy sensation or mild soreness. DO NOT RUB YOUR EYE!  The day of surgery:  Minimal physical activity, but bed rest is not required  No reading, computer work, or close hand work  No bending, lifting, or straining.  May watch TV  For 24 hours:  No driving, legal decisions, or alcoholic beverages  Safety precautions  Eat anything you prefer: It is better to start with liquids, then soup then solid foods.  _____ Eye patch should be worn until postoperative exam tomorrow.  ____ Solar shield eyeglasses should be worn for comfort in the sunlight/patch while sleeping  Resume all regular medications including aspirin or Coumadin if these were discontinued prior to surgery. You may shower, bathe, shave, or wash your hair. Tylenol may be taken for mild discomfort.  Call your doctor if you experience significant pain, nausea, or vomiting, fever > 101 or other signs of infection. [phone] or [phone] Specific instructions:  Follow-up Information    Birder Robson, MD Follow up.   Specialty:  Ophthalmology Why:  June 20 at 10:25am Contact information: [address] [place] [address] [phone]

## 2016-10-11 NOTE — Transfer of Care (Signed)
Immediate Anesthesia Transfer of Care Note  Patient: Sarah [name]  Procedure(s) Performed: Procedure(s) with comments: CATARACT EXTRACTION PHACO AND INTRAOCULAR LENS PLACEMENT (IOC) (Left) - [place] 00:30 AP% 11.3 CDE 3.50 fluid pack lot # [id number]  Patient Location: PACU  Anesthesia Type:MAC  Level of Consciousness: awake and alert   Airway & Oxygen Therapy: Patient Spontanous Breathing and Patient connected to nasal cannula oxygen  Post-op Assessment: Report given to RN and Post -op Vital signs reviewed and stable  Post vital signs: Reviewed  Last Vitals:  Vitals:   10/11/16 0741  BP: (!) 151/74  Pulse: 66  Resp: 18  Temp: 36.7 C    Last Pain:  Vitals:   10/11/16 0741  TempSrc: Oral         Complications: No apparent anesthesia complications

## 2016-11-17 DIAGNOSIS — Z961 Presence of intraocular lens: Secondary | ICD-10-CM | POA: Diagnosis not present

## 2017-02-03 DIAGNOSIS — S39012A Strain of muscle, fascia and tendon of lower back, initial encounter: Secondary | ICD-10-CM | POA: Diagnosis not present

## 2017-02-03 DIAGNOSIS — B9689 Other specified bacterial agents as the cause of diseases classified elsewhere: Secondary | ICD-10-CM | POA: Diagnosis not present

## 2017-02-03 DIAGNOSIS — J019 Acute sinusitis, unspecified: Secondary | ICD-10-CM | POA: Diagnosis not present

## 2017-02-03 DIAGNOSIS — J209 Acute bronchitis, unspecified: Secondary | ICD-10-CM | POA: Diagnosis not present

## 2017-03-18 DIAGNOSIS — B029 Zoster without complications: Secondary | ICD-10-CM | POA: Diagnosis not present

## 2017-04-25 DIAGNOSIS — K0889 Other specified disorders of teeth and supporting structures: Secondary | ICD-10-CM | POA: Diagnosis not present

## 2017-05-04 ENCOUNTER — Ambulatory Visit: Payer: Medicare Other

## 2017-05-17 DIAGNOSIS — Z961 Presence of intraocular lens: Secondary | ICD-10-CM | POA: Diagnosis not present

## 2017-06-05 ENCOUNTER — Encounter: Payer: Self-pay | Admitting: Internal Medicine

## 2017-06-05 ENCOUNTER — Ambulatory Visit: Payer: Medicare Other | Admitting: Internal Medicine

## 2017-06-05 VITALS — BP 134/68 | HR 81 | Temp 97.6°F | Resp 15 | Ht 66.0 in | Wt 183.0 lb

## 2017-06-05 DIAGNOSIS — E785 Hyperlipidemia, unspecified: Secondary | ICD-10-CM

## 2017-06-05 DIAGNOSIS — R5383 Other fatigue: Secondary | ICD-10-CM | POA: Diagnosis not present

## 2017-06-05 DIAGNOSIS — Z1231 Encounter for screening mammogram for malignant neoplasm of breast: Secondary | ICD-10-CM

## 2017-06-05 DIAGNOSIS — B0229 Other postherpetic nervous system involvement: Secondary | ICD-10-CM | POA: Diagnosis not present

## 2017-06-05 DIAGNOSIS — Z23 Encounter for immunization: Secondary | ICD-10-CM | POA: Diagnosis not present

## 2017-06-05 DIAGNOSIS — J320 Chronic maxillary sinusitis: Secondary | ICD-10-CM | POA: Diagnosis not present

## 2017-06-05 DIAGNOSIS — R6884 Jaw pain: Secondary | ICD-10-CM

## 2017-06-05 DIAGNOSIS — Z1239 Encounter for other screening for malignant neoplasm of breast: Secondary | ICD-10-CM

## 2017-06-05 DIAGNOSIS — J069 Acute upper respiratory infection, unspecified: Secondary | ICD-10-CM | POA: Diagnosis not present

## 2017-06-05 DIAGNOSIS — B9789 Other viral agents as the cause of diseases classified elsewhere: Secondary | ICD-10-CM

## 2017-06-05 DIAGNOSIS — R220 Localized swelling, mass and lump, head: Secondary | ICD-10-CM | POA: Diagnosis not present

## 2017-06-05 MED ORDER — ZOSTER VAC RECOMB ADJUVANTED 50 MCG/0.5ML IM SUSR: 0.5000 mL | Freq: Once | INTRAMUSCULAR | 1 refills | Status: AC

## 2017-06-05 MED ORDER — BENZONATATE 100 MG PO CAPS: 100.0000 mg | ORAL_CAPSULE | Freq: Three times a day (TID) | ORAL | 0 refills | Status: DC

## 2017-06-05 NOTE — Patient Instructions (Signed)
I have prescribed Tessalon perles for your cough   You should try irrigating your sinuses once daily with [employer]'s Sinus rinse ;  It is a stong sinus "flush" using water and medicated salts.  Do it over the sink because it can be a bit messy  I will order a CT of your sinuses  ,  This may require an  ENT referral   Return for fasting labs

## 2017-06-05 NOTE — Progress Notes (Signed)
Subjective:  Patient ID: Sarah [name], female    DOB: 1950/06/19  Age: 67 y.o. MRN: [id number]  CC: The primary encounter diagnosis was Hyperlipidemia LDL goal <130. Diagnoses of Viral URI with cough, Fatigue, unspecified type, Encounter for immunization, Localized swelling, mass, and lump of head, Chronic sinusitis of both maxillary sinuses, Breast cancer screening, Post herpetic neuralgia, and Maxillary pain were also pertinent to this visit.  HPI Sarah [name] presents for follow up on recent shingles episode and chronic sinus pain .    Patient was treated for acute bacterial sinusitis in October with augmentin.  Had continued maxillary sinus pain,  Saw her dentist afterward for continued pain and had some dental issues managed to completion.  Still having pain in maxillary sinsues and has intermittent blood streaked nasal drainage from one or both sides (patient is not sure) .  Had a  Blistering  rash under left breast  In November , rash extended to back , left side.  Ways she was treated for shingles with an antibiotic (not sure of name ) for 7 to 10 days.  Still has occasional stabbing pain in left breast.   Wants shingrx     Outpatient Medications Prior to Visit  Medication Sig Dispense Refill  . albuterol (PROVENTIL HFA;VENTOLIN HFA) 108 (90 Base) MCG/ACT inhaler Inhale 2 puffs into the lungs every 6 (six) hours as needed for wheezing or shortness of breath. 1 Inhaler 11  . Calcium Carb-Cholecalciferol (CALCIUM 600 + D PO) Take 2 tablets by mouth daily.    . citalopram (CELEXA) 20 MG tablet Take 1 tablet (20 mg total) by mouth daily. 30 tablet 11  . famotidine (PEPCID) 20 MG tablet TAKE 1 TABLET BY MOUTH DAILY. BEFORE DINNER (Patient taking differently: Take 20 mg by mouth daily before supper. BEFORE DINNER) 30 tablet 11  . fexofenadine (ALLEGRA) 180 MG tablet Take 180 mg by mouth daily.    Marland Kitchen ibuprofen (ADVIL,MOTRIN) 200 MG tablet Take 600 mg by mouth daily as needed for moderate  pain.     . Omega-3 Fatty Acids (FISH OIL) 1200 MG CAPS Take 1,200 mg by mouth daily.     . pantoprazole (PROTONIX) 40 MG tablet TAKE 1 TABLET BY MOUTH DAILY. IN AM 30 MINUTES BEFORE EATING (Patient taking differently: Take 40 mg by mouth daily before breakfast. 30 MINUTES BEFORE EATING) 30 tablet 11  . ciprofloxacin (CIPRO) 250 MG tablet Take 1 tablet (250 mg total) by mouth 2 (two) times daily. (Patient not taking: Reported on 09/07/2016) 10 tablet 0  . Difluprednate (DUREZOL) 0.05 % EMUL Place 1 drop into the right eye daily.     No facility-administered medications prior to visit.     Review of Systems;  Patient denies headache, fevers, malaise, unintentional weight loss, skin rash, eye pain, sinus congestion and sinus pain, sore throat, dysphagia,  hemoptysis , cough, dyspnea, wheezing, chest pain, palpitations, orthopnea, edema, abdominal pain, nausea, melena, diarrhea, constipation, flank pain, dysuria, hematuria, urinary  Frequency, nocturia, numbness, tingling, seizures,  Focal weakness, Loss of consciousness,  Tremor, insomnia, depression, anxiety, and suicidal ideation.      Objective:  BP 134/68 (BP Location: Left Arm, Patient Position: Sitting, Cuff Size: Normal)   Pulse 81   Temp 97.6 F (36.4 C) (Oral)   Resp 15   Ht 5\' 6"  (1.676 m)   Wt 183 lb (83 kg)   SpO2 97%   BMI 29.54 kg/m   BP Readings from Last 3 Encounters:  06/05/17  134/68  10/11/16 (!) 152/78  10/04/16 (!) 147/74    Wt Readings from Last 3 Encounters:  06/05/17 183 lb (83 kg)  10/11/16 180 lb (81.6 kg)  10/04/16 180 lb (81.6 kg)    General appearance: alert, cooperative and appears stated age Ears: normal TM's and external ear canals both ears Face: bilateral maxillary sinus tenderness  Left > right  Throat: lips, mucosa, and tongue normal; teeth and gums normal Neck: no adenopathy, no carotid bruit, supple, symmetrical, trachea midline and thyroid not enlarged, symmetric, no  tenderness/mass/nodules Back: symmetric, no curvature. ROM normal. No CVA tenderness. Lungs: clear to auscultation bilaterally Heart: regular rate and rhythm, S1, S2 normal, no murmur, click, rub or gallop Abdomen: soft, non-tender; bowel sounds normal; no masses,  no organomegaly Pulses: 2+ and symmetric Skin: Skin color, texture, turgor normal. No rashes or lesions Lymph nodes: Cervical, supraclavicular, and axillary nodes normal.  No results found for: HGBA1C  Lab Results  Component Value Date   CREATININE 0.80 07/30/2015   CREATININE 0.74 01/19/2015   CREATININE 0.77 06/18/2014    Lab Results  Component Value Date   WBC 5.3 06/18/2014   HGB 13.1 06/18/2014   HCT 38.7 06/18/2014   PLT 205.0 06/18/2014   GLUCOSE 85 07/30/2015   CHOL 231 (H) 07/30/2015   TRIG 72.0 07/30/2015   HDL 61.00 07/30/2015   LDLCALC 156 (H) 07/30/2015   ALT 11 07/30/2015   AST 11 07/30/2015   NA 143 07/30/2015   K 4.0 07/30/2015   CL 104 07/30/2015   CREATININE 0.80 07/30/2015   BUN 20 07/30/2015   CO2 31 07/30/2015   TSH 5.14 (H) 07/30/2015    No results found.  Assessment & Plan:   Problem List Items Addressed This Visit    Hyperlipidemia LDL goal <130 - Primary   Relevant Orders   Lipid panel   Post herpetic neuralgia    Improving,  From recent reorted shingles outbreak left thorax      Maxillary pain    Persistent bilateral maxillary pain,  Left > right with periodic bloody drainage, espite antibiotic treatment in October amd dental evaluation for same. .  CT sinuses ordered       RESOLVED: Viral URI with cough    Other Visit Diagnoses    Fatigue, unspecified type       Relevant Orders   Comprehensive metabolic panel   TSH   CBC with Differential/Platelet   Encounter for immunization       Relevant Orders   Flu vaccine HIGH DOSE PF (Completed)   Localized swelling, mass, and lump of head       Relevant Orders   CT Maxillofacial WO CM   Chronic sinusitis of both  maxillary sinuses       Relevant Medications   benzonatate (TESSALON) 100 MG capsule   Other Relevant Orders   CT Maxillofacial WO CM   Breast cancer screening       Relevant Orders   MM SCREENING BREAST TOMO BILATERAL      I have discontinued Hassan Rowan C. Palmateer's ciprofloxacin and Difluprednate. I am also having her start on Zoster Vaccine Adjuvanted and benzonatate. Additionally, I am having her maintain her Fish Oil, ibuprofen, albuterol, citalopram, famotidine, pantoprazole, fexofenadine, and Calcium Carb-Cholecalciferol (CALCIUM 600 + D PO).  Meds ordered this encounter  Medications  . Zoster Vaccine Adjuvanted [facility]) injection    Sig: Inject 0.5 mLs into the muscle once for 1 dose.    Dispense:  1  each    Refill:  1  . benzonatate (TESSALON) 100 MG capsule    Sig: Take 1 capsule (100 mg total) by mouth 3 (three) times daily.    Dispense:  30 capsule    Refill:  0   A total of 25 minutes of face to face time was spent with patient more than half of which was spent in counselling about the above mentioned conditions  and coordination of care  Medications Discontinued During This Encounter  Medication Reason  . ciprofloxacin (CIPRO) 250 MG tablet Completed Course  . Difluprednate (DUREZOL) 0.05 % EMUL Completed Course    Follow-up: Return in about 1 week (around 06/12/2017), or fasting labs .   Crecencio Mc, MD

## 2017-06-06 DIAGNOSIS — B0229 Other postherpetic nervous system involvement: Secondary | ICD-10-CM | POA: Insufficient documentation

## 2017-06-06 DIAGNOSIS — R6884 Jaw pain: Secondary | ICD-10-CM | POA: Insufficient documentation

## 2017-06-06 NOTE — Assessment & Plan Note (Signed)
Improving,  From recent reorted shingles outbreak left thorax

## 2017-06-06 NOTE — Assessment & Plan Note (Signed)
Persistent bilateral maxillary pain,  Left > right with periodic bloody drainage, espite antibiotic treatment in October amd dental evaluation for same. .  CT sinuses ordered

## 2017-06-15 ENCOUNTER — Other Ambulatory Visit (INDEPENDENT_AMBULATORY_CARE_PROVIDER_SITE_OTHER): Payer: Medicare Other

## 2017-06-15 DIAGNOSIS — E039 Hypothyroidism, unspecified: Secondary | ICD-10-CM | POA: Insufficient documentation

## 2017-06-15 DIAGNOSIS — R5383 Other fatigue: Secondary | ICD-10-CM | POA: Diagnosis not present

## 2017-06-15 DIAGNOSIS — E785 Hyperlipidemia, unspecified: Secondary | ICD-10-CM

## 2017-06-15 LAB — CBC WITH DIFFERENTIAL/PLATELET
BASOS PCT: 1.4 % (ref 0.0–3.0)
Basophils Absolute: 0.1 10*3/uL (ref 0.0–0.1)
EOS PCT: 3.5 % (ref 0.0–5.0)
Eosinophils Absolute: 0.2 10*3/uL (ref 0.0–0.7)
HEMATOCRIT: 35.8 % — AB (ref 36.0–46.0)
HEMOGLOBIN: 12 g/dL (ref 12.0–15.0)
LYMPHS PCT: 27.1 % (ref 12.0–46.0)
Lymphs Abs: 1.4 10*3/uL (ref 0.7–4.0)
MCHC: 33.5 g/dL (ref 30.0–36.0)
MCV: 97.2 fl (ref 78.0–100.0)
MONO ABS: 0.4 10*3/uL (ref 0.1–1.0)
MONOS PCT: 6.8 % (ref 3.0–12.0)
Neutro Abs: 3.3 10*3/uL (ref 1.4–7.7)
Neutrophils Relative %: 61.2 % (ref 43.0–77.0)
Platelets: 171 10*3/uL (ref 150.0–400.0)
RBC: 3.68 Mil/uL — ABNORMAL LOW (ref 3.87–5.11)
RDW: 14.4 % (ref 11.5–15.5)
WBC: 5.3 10*3/uL (ref 4.0–10.5)

## 2017-06-15 LAB — COMPREHENSIVE METABOLIC PANEL
ALBUMIN: 3.9 g/dL (ref 3.5–5.2)
ALK PHOS: 70 U/L (ref 39–117)
ALT: 11 U/L (ref 0–35)
AST: 11 U/L (ref 0–37)
BUN: 13 mg/dL (ref 6–23)
CO2: 32 mEq/L (ref 19–32)
Calcium: 9.4 mg/dL (ref 8.4–10.5)
Chloride: 103 mEq/L (ref 96–112)
Creatinine, Ser: 0.84 mg/dL (ref 0.40–1.20)
GFR: 71.68 mL/min (ref >60.00)
GLUCOSE: 98 mg/dL (ref 70–99)
POTASSIUM: 3.8 meq/L (ref 3.5–5.1)
Sodium: 140 mEq/L (ref 135–145)
TOTAL PROTEIN: 6.7 g/dL (ref 6.0–8.3)
Total Bilirubin: 0.6 mg/dL (ref 0.2–1.2)

## 2017-06-15 LAB — LIPID PANEL
Cholesterol: 219 mg/dL — ABNORMAL HIGH (ref 0–200)
HDL: 64.3 mg/dL (ref >39.00)
LDL Cholesterol: 139 mg/dL — ABNORMAL HIGH (ref 0–99)
NONHDL: 154.22
Total CHOL/HDL Ratio: 3
Triglycerides: 75 mg/dL (ref 0.0–149.0)
VLDL: 15 mg/dL (ref 0.0–40.0)

## 2017-06-15 LAB — TSH: TSH: 4.62 u[IU]/mL — AB (ref 0.35–4.50)

## 2017-06-15 MED ORDER — LEVOTHYROXINE SODIUM 50 MCG PO TABS: 50.0000 ug | ORAL_TABLET | Freq: Every day | ORAL | 3 refills | Status: DC

## 2017-06-15 NOTE — Addendum Note (Signed)
Addended by: Crecencio Mc on: 06/15/2017 04:54 PM   Modules accepted: Orders

## 2017-06-15 NOTE — Assessment & Plan Note (Signed)
starting supplementation for continued elevated tsh  Lab Results  Component Value Date   TSH 4.62 (H) 06/15/2017

## 2017-06-20 ENCOUNTER — Telehealth: Payer: Self-pay | Admitting: Internal Medicine

## 2017-06-20 NOTE — Telephone Encounter (Signed)
Please advise 

## 2017-06-20 NOTE — Telephone Encounter (Signed)
Copied from [place]. Topic: Quick Communication - See Telephone Encounter >> Jun 20, 2017  9:41 AM Aurelio Brash B wrote: CRM for notification. See Telephone encounter for:  Threasa Beards from [facility] called about prior authorization  for the pts CT w/contrast She is requesting a call back at [phone] 06/20/17.

## 2017-06-22 ENCOUNTER — Ambulatory Visit
Admission: RE | Admit: 2017-06-22 | Discharge: 2017-06-22 | Disposition: A | Discharge status: Home / Self Care | Payer: Medicare Other | Source: Ambulatory Visit | Attending: Internal Medicine | Admitting: Internal Medicine

## 2017-06-22 DIAGNOSIS — G3189 Other specified degenerative diseases of nervous system: Secondary | ICD-10-CM | POA: Insufficient documentation

## 2017-06-22 DIAGNOSIS — J320 Chronic maxillary sinusitis: Secondary | ICD-10-CM | POA: Insufficient documentation

## 2017-06-22 DIAGNOSIS — R220 Localized swelling, mass and lump, head: Secondary | ICD-10-CM | POA: Diagnosis not present

## 2017-06-22 DIAGNOSIS — J3489 Other specified disorders of nose and nasal sinuses: Secondary | ICD-10-CM | POA: Diagnosis not present

## 2017-06-26 ENCOUNTER — Other Ambulatory Visit: Payer: Self-pay | Admitting: Internal Medicine

## 2017-06-26 MED ORDER — MUPIROCIN 2 % EX OINT: 1.0000 "application " | TOPICAL_OINTMENT | Freq: Two times a day (BID) | CUTANEOUS | 0 refills | Status: DC

## 2017-06-26 NOTE — Progress Notes (Unsigned)
mupiro

## 2017-07-10 ENCOUNTER — Telehealth: Payer: Self-pay

## 2017-07-10 NOTE — Telephone Encounter (Signed)
Updated pharmacy

## 2017-07-28 ENCOUNTER — Other Ambulatory Visit: Payer: Self-pay

## 2017-07-28 MED ORDER — FAMOTIDINE 20 MG PO TABS: ORAL_TABLET | ORAL | 11 refills | Status: DC

## 2017-07-28 MED ORDER — PANTOPRAZOLE SODIUM 40 MG PO TBEC: DELAYED_RELEASE_TABLET | ORAL | 11 refills | Status: DC

## 2017-07-28 MED ORDER — CITALOPRAM HYDROBROMIDE 20 MG PO TABS
20.0000 mg | ORAL_TABLET | Freq: Every day | ORAL | 11 refills | Status: DC
Start: 2017-07-28 — End: 2018-01-26

## 2017-08-01 ENCOUNTER — Other Ambulatory Visit (INDEPENDENT_AMBULATORY_CARE_PROVIDER_SITE_OTHER): Payer: Medicare Other

## 2017-08-01 DIAGNOSIS — E039 Hypothyroidism, unspecified: Secondary | ICD-10-CM

## 2017-08-01 LAB — TSH: TSH: 1.82 u[IU]/mL (ref 0.35–4.50)

## 2017-08-03 ENCOUNTER — Encounter: Payer: Self-pay | Admitting: *Deleted

## 2017-09-04 ENCOUNTER — Ambulatory Visit: Payer: Medicare Other | Admitting: Internal Medicine

## 2017-10-04 ENCOUNTER — Encounter: Payer: Self-pay | Admitting: Internal Medicine

## 2017-10-04 ENCOUNTER — Ambulatory Visit: Payer: Medicare Other | Admitting: Internal Medicine

## 2017-10-04 DIAGNOSIS — B0229 Other postherpetic nervous system involvement: Secondary | ICD-10-CM

## 2017-10-04 DIAGNOSIS — E039 Hypothyroidism, unspecified: Secondary | ICD-10-CM | POA: Diagnosis not present

## 2017-10-04 DIAGNOSIS — F329 Major depressive disorder, single episode, unspecified: Secondary | ICD-10-CM | POA: Diagnosis not present

## 2017-10-04 DIAGNOSIS — J4520 Mild intermittent asthma, uncomplicated: Secondary | ICD-10-CM | POA: Diagnosis not present

## 2017-10-04 DIAGNOSIS — R634 Abnormal weight loss: Secondary | ICD-10-CM | POA: Diagnosis not present

## 2017-10-04 NOTE — Progress Notes (Signed)
Subjective:  Patient ID: Sarah [name], female    DOB: Jul 07, 1950  Age: 67 y.o. MRN: [id number]  CC: Diagnoses of Acquired hypothyroidism, Mild intermittent asthma, unspecified whether complicated, Post herpetic neuralgia, Reactive depression, and Weight loss, unintentional were pertinent to this visit.  HPI Sarah [name] presents for follow up on asthma,   hypothyroidism and depression.   She  feels generally well, is exercising several times per week and has a history of a fall several months ago.  She has no recent asthma exacerbations despite not using a maintenance inhaler.  Her last use of albuterol MDI was several weeks ago.  and is ui recent history of falls or asthma exacerbations .    Reviewed the events surrounding her fall.  Occurred  When she attempted to stand up in her bathtub.  She admits that she was not using the safety bar that is already installed on the wall of the tub and has been using it without incident since her fall  . She lives alone.    Taking allegra for allergic rhinitis   new dental implants look great, no mouth pain,    Working part time delivering medications for [employer]   6 lb weight loss noted.  Diet reviewed.  She eats one meal daily , has A few snacks throughout the day, ice Krispy cakes and Hershey's bars .  Outpatient Medications Prior to Visit  Medication Sig Dispense Refill  . albuterol (PROVENTIL HFA;VENTOLIN HFA) 108 (90 Base) MCG/ACT inhaler Inhale 2 puffs into the lungs every 6 (six) hours as needed for wheezing or shortness of breath. 1 Inhaler 11  . Calcium Carb-Cholecalciferol (CALCIUM 600 + D PO) Take 2 tablets by mouth daily.    . citalopram (CELEXA) 20 MG tablet Take 1 tablet (20 mg total) by mouth daily. 30 tablet 11  . famotidine (PEPCID) 20 MG tablet TAKE 1 TABLET BY MOUTH DAILY. BEFORE DINNER 30 tablet 11  . fexofenadine (ALLEGRA) 180 MG tablet Take 180 mg by mouth daily.    Marland Kitchen ibuprofen (ADVIL,MOTRIN) 200 MG tablet  Take 600 mg by mouth daily as needed for moderate pain.     Marland Kitchen levothyroxine (SYNTHROID, LEVOTHROID) 50 MCG tablet Take 1 tablet (50 mcg total) by mouth daily. In the morning one hour before meal 90 tablet 3  . Omega-3 Fatty Acids (FISH OIL) 1200 MG CAPS Take 1,200 mg by mouth daily.     . pantoprazole (PROTONIX) 40 MG tablet TAKE 1 TABLET BY MOUTH DAILY. IN AM 30 MINUTES BEFORE EATING 30 tablet 11  . benzonatate (TESSALON) 100 MG capsule Take 1 capsule (100 mg total) by mouth 3 (three) times daily. (Patient not taking: Reported on 10/04/2017) 30 capsule 0  . mupirocin ointment (BACTROBAN) 2 % Place 1 application into the nose 2 (two) times daily. (Patient not taking: Reported on 10/04/2017) 22 g 0   No facility-administered medications prior to visit.     Review of Systems;  Patient denies headache, fevers, malaise, unintentional weight loss, skin rash, eye pain, sinus congestion and sinus pain, sore throat, dysphagia,  hemoptysis , cough, dyspnea, wheezing, chest pain, palpitations, orthopnea, edema, abdominal pain, nausea, melena, diarrhea, constipation, flank pain, dysuria, hematuria, urinary  Frequency, nocturia, numbness, tingling, seizures,  Focal weakness, Loss of consciousness,  Tremor, insomnia, depression, anxiety, and suicidal ideation.      Objective:  BP 120/68 (BP Location: Left Arm, Patient Position: Sitting, Cuff Size: Normal)   Pulse 70   Temp 98.1 F (  36.7 C) (Oral)   Resp 16   Ht 5\' 6"  (1.676 m)   Wt 177 lb 12.8 oz (80.6 kg)   SpO2 96%   BMI 28.70 kg/m   BP Readings from Last 3 Encounters:  10/04/17 120/68  06/05/17 134/68  10/11/16 (!) 152/78    Wt Readings from Last 3 Encounters:  10/04/17 177 lb 12.8 oz (80.6 kg)  06/05/17 183 lb (83 kg)  10/11/16 180 lb (81.6 kg)    General appearance: alert, cooperative and appears stated age Ears: normal TM's and external ear canals both ears Throat: lips, mucosa, and tongue normal; teeth and gums normal Neck: no  adenopathy, no carotid bruit, supple, symmetrical, trachea midline and thyroid not enlarged, symmetric, no tenderness/mass/nodules Back: symmetric, no curvature. ROM normal. No CVA tenderness. Lungs: clear to auscultation bilaterally Heart: regular rate and rhythm, S1, S2 normal, no murmur, click, rub or gallop Abdomen: soft, non-tender; bowel sounds normal; no masses,  no organomegaly Pulses: 2+ and symmetric Skin: Skin color, texture, turgor normal. No rashes or lesions Lymph nodes: Cervical, supraclavicular, and axillary nodes normal.  No results found for: HGBA1C  Lab Results  Component Value Date   CREATININE 0.84 06/15/2017   CREATININE 0.80 07/30/2015   CREATININE 0.74 01/19/2015    Lab Results  Component Value Date   WBC 5.3 06/15/2017   HGB 12.0 06/15/2017   HCT 35.8 (L) 06/15/2017   PLT 171.0 06/15/2017   GLUCOSE 98 06/15/2017   CHOL 219 (H) 06/15/2017   TRIG 75.0 06/15/2017   HDL 64.30 06/15/2017   LDLCALC 139 (H) 06/15/2017   ALT 11 06/15/2017   AST 11 06/15/2017   NA 140 06/15/2017   K 3.8 06/15/2017   CL 103 06/15/2017   CREATININE 0.84 06/15/2017   BUN 13 06/15/2017   CO2 32 06/15/2017   TSH 1.82 08/01/2017    Ct Maxillofacial Wo Cm  Result Date: 06/22/2017 CLINICAL DATA:  Bloody nasal discharge with pain in upper teeth region EXAM: CT MAXILLOFACIAL WITHOUT CONTRAST TECHNIQUE: Multidetector CT imaging of the maxillofacial structures was performed. Multiplanar CT image reconstructions were also generated. COMPARISON:  None. FINDINGS: Osseous: No acute fracture or dislocation. Several tiny calcifications near the vomer may represent residua of prior trauma. There are no blastic or lytic bone lesions. There is degenerative change in the visualized cervical spine. There are multiple dental implants in the superior alveolar ridge. There is moderate susceptibility artifact in this area. There is no evidence of bony destruction or erosion. Orbits: Orbits appear  symmetric bilaterally. No intraorbital lesions are appreciable. Note that the patient has had bilateral cataract removals. Sinuses: There is mild mucosal thickening in several ethmoid air cells. There other paranasal sinuses are clear. No air-fluid levels. No bony destruction or expansion. Ostiomeatal unit complexes are patent bilaterally. Nasal septum is in the midline. Soft tissues: There is no appreciable soft tissue swelling or hematoma. No abscess. No fistula or abnormal fluid collection. Salivary glands appear symmetric and unremarkable bilaterally. No demonstrable adenopathy. Tongue and tongue base regions appear normal. The visualized pharynx appears normal. Limited intracranial: Mastoid air cells are clear. There is mild-to-moderate atrophy. No focal intracranial lesions are evident in the visualized regions. IMPRESSION: 1. Question old trauma in the region of the vomer with several tiny calcifications. No acute fracture evident. No blastic or lytic bone lesions. 2. Multiple dental implants in the superior alveolar ridge. There is moderate susceptibility artifact in this area from the implants. No bony destruction or erosion evident. 3.  Mucosal thickening in several ethmoid air cells. Other paranasal sinuses are clear. Ostiomeatal unit complexes are patent bilaterally. 4. No intraorbital lesions. Status post cataract removals bilaterally. 5. Cerebral atrophy without focal lesion in the visualized intracranial regions. 6.  Areas of arthropathy in the visualized cervical spine. Electronically Signed   By: Lowella Grip III M.D.   On: 06/22/2017 08:14    Assessment & Plan:   Problem List Items Addressed This Visit    Weight loss, unintentional    The weight loss is welcomed but not a result of a healthy diet.    I have reviewed her diet and recommended that she increase her protein intake while reducing her carbohydrates.       Post herpetic neuralgia    Now resolved.  enouraged to get the new  Shingles vaccine      Hypothyroid    Thyroid function is WNL on current dose.  No current changes needed.  Explained that she will need to continue medication for life.   Lab Results  Component Value Date   TSH 1.82 08/01/2017         Depression    Managed with citalopram. She is less morose today  (did not even mention her deceased mother )  has actually taken on a part time job,  And has had dental implants.  All good signs o f recovery.  Continue citalopram       Asthma    Currently asymptomatic, on no meds,  Has not had to use albuterol in over a months .  Continue daily antihistamine           A total of 25 minutes of face to face time was spent with patient more than half of which was spent in counselling about the above mentioned conditions  and coordination of care  I have discontinued Hassan Rowan C. Wander's benzonatate and mupirocin ointment. I am also having her maintain her Fish Oil, ibuprofen, albuterol, fexofenadine, Calcium Carb-Cholecalciferol (CALCIUM 600 + D PO), levothyroxine, citalopram, famotidine, and pantoprazole.  No orders of the defined types were placed in this encounter.   Medications Discontinued During This Encounter  Medication Reason  . mupirocin ointment (BACTROBAN) 2 % Completed Course  . benzonatate (TESSALON) 100 MG capsule Completed Course    Follow-up: Return in about 6 months (around 04/05/2018) for CPE.   Crecencio Mc, MD

## 2017-10-04 NOTE — Patient Instructions (Signed)
Your mammogram is overdue and has been ordered You are on thryoid medication becas eyour thryod is underactive;  Please continue the medication   Return in 6 months for annual exam

## 2017-10-08 DIAGNOSIS — R634 Abnormal weight loss: Secondary | ICD-10-CM | POA: Insufficient documentation

## 2017-10-08 NOTE — Assessment & Plan Note (Signed)
Currently asymptomatic, on no meds,  Has not had to use albuterol in over a months .  Continue daily antihistamine

## 2017-10-08 NOTE — Assessment & Plan Note (Signed)
Now resolved.  enouraged to get the new Shingles vaccine

## 2017-10-08 NOTE — Assessment & Plan Note (Signed)
The weight loss is welcomed but not a result of a healthy diet.    I have reviewed her diet and recommended that she increase her protein intake while reducing her carbohydrates.

## 2017-10-08 NOTE — Assessment & Plan Note (Addendum)
Thyroid function is WNL on current dose.  No current changes needed.  Explained that she will need to continue medication for life.   Lab Results  Component Value Date   TSH 1.82 08/01/2017

## 2017-10-08 NOTE — Assessment & Plan Note (Signed)
Managed with citalopram. She is less morose today  (did not even mention her deceased mother )  has actually taken on a part time job,  And has had dental implants.  All good signs o f recovery.  Continue citalopram

## 2017-10-09 ENCOUNTER — Ambulatory Visit
Admission: RE | Admit: 2017-10-09 | Discharge: 2017-10-09 | Disposition: A | Discharge status: Home / Self Care | Payer: Medicare Other | Source: Ambulatory Visit | Attending: Internal Medicine | Admitting: Internal Medicine

## 2017-10-09 DIAGNOSIS — Z1239 Encounter for other screening for malignant neoplasm of breast: Secondary | ICD-10-CM

## 2017-10-09 DIAGNOSIS — Z1231 Encounter for screening mammogram for malignant neoplasm of breast: Secondary | ICD-10-CM | POA: Diagnosis not present

## 2018-01-24 DIAGNOSIS — Z23 Encounter for immunization: Secondary | ICD-10-CM | POA: Diagnosis not present

## 2018-01-26 ENCOUNTER — Other Ambulatory Visit: Payer: Self-pay | Admitting: Internal Medicine

## 2018-01-26 MED ORDER — CITALOPRAM HYDROBROMIDE 20 MG PO TABS: 20.0000 mg | ORAL_TABLET | Freq: Every day | ORAL | 11 refills | Status: DC

## 2018-01-26 NOTE — Telephone Encounter (Signed)
Copied from [place] [id number]. Topic: Quick Communication - Rx Refill/Question >> Jan 26, 2018  9:39 AM Gardiner Ramus wrote: Medication: citalopram (CELEXA) 20 MG tablet [[id number]]  Pt called and stated that previous pharmacy went out of business and [facility]. Pt states she did not know she would need to call this in. She also states that she will fun out soon     Has the patient contacted their pharmacy?yes Preferred Pharmacy (with phone number or street name): [id number] - [place], [place] [phone] (Phone) [phone] (Fax)  Agent: Please be advised that RX refills may take up to 3 business days. We ask that you follow-up with your pharmacy.

## 2018-01-26 NOTE — Telephone Encounter (Signed)
Requested Prescriptions  Pending Prescriptions Disp Refills  . citalopram (CELEXA) 20 MG tablet 30 tablet 11    Sig: Take 1 tablet (20 mg total) by mouth daily.     Psychiatry:  Antidepressants - SSRI Passed - 01/26/2018  9:51 AM      Passed - Completed PHQ-2 or PHQ-9 in the last 360 days.      Passed - Valid encounter within last 6 months    Recent Outpatient Visits          3 months ago Acquired hypothyroidism   [facility] [place] Crecencio Mc, MD   7 months ago Hyperlipidemia LDL goal <130   [place] Crecencio Mc, MD   1 year ago Frequency of urination   [place] Crecencio Mc, MD   1 year ago Mild intermittent asthma with exacerbation   [facility]  Crecencio Mc, MD   2 years ago Achilles tendon disorder, right   [place] Crecencio Mc, MD      Future Appointments            In 2 months Derrel Nip, Aris Everts, MD [facility], [facility]

## 2018-04-06 ENCOUNTER — Ambulatory Visit: Payer: Medicare Other

## 2018-04-06 ENCOUNTER — Ambulatory Visit (INDEPENDENT_AMBULATORY_CARE_PROVIDER_SITE_OTHER): Payer: Medicare Other | Admitting: Internal Medicine

## 2018-04-06 ENCOUNTER — Encounter: Payer: Self-pay | Admitting: Internal Medicine

## 2018-04-06 VITALS — BP 144/64 | HR 87 | Temp 97.3°F | Resp 15 | Ht 66.0 in | Wt 185.2 lb

## 2018-04-06 DIAGNOSIS — E039 Hypothyroidism, unspecified: Secondary | ICD-10-CM | POA: Diagnosis not present

## 2018-04-06 DIAGNOSIS — R0600 Dyspnea, unspecified: Secondary | ICD-10-CM

## 2018-04-06 DIAGNOSIS — R0609 Other forms of dyspnea: Secondary | ICD-10-CM

## 2018-04-06 DIAGNOSIS — Z0001 Encounter for general adult medical examination with abnormal findings: Secondary | ICD-10-CM | POA: Diagnosis not present

## 2018-04-06 DIAGNOSIS — E785 Hyperlipidemia, unspecified: Secondary | ICD-10-CM | POA: Diagnosis not present

## 2018-04-06 DIAGNOSIS — Z0000 Encounter for general adult medical examination without abnormal findings: Secondary | ICD-10-CM

## 2018-04-06 LAB — COMPREHENSIVE METABOLIC PANEL
AG Ratio: 1.6 (calc) (ref 1.0–2.5)
ALKALINE PHOSPHATASE (APISO): 83 U/L (ref 33–130)
ALT: 9 U/L (ref 6–29)
AST: 11 U/L (ref 10–35)
Albumin: 4.2 g/dL (ref 3.6–5.1)
BILIRUBIN TOTAL: 0.5 mg/dL (ref 0.2–1.2)
BUN: 12 mg/dL (ref 7–25)
CALCIUM: 9.6 mg/dL (ref 8.6–10.4)
CO2: 29 mmol/L (ref 20–32)
Chloride: 102 mmol/L (ref 98–110)
Creat: 0.84 mg/dL (ref 0.50–0.99)
Globulin: 2.7 g/dL (calc) (ref 1.9–3.7)
Glucose, Bld: 146 mg/dL — ABNORMAL HIGH (ref 65–99)
Potassium: 4.1 mmol/L (ref 3.5–5.3)
Sodium: 139 mmol/L (ref 135–146)
Total Protein: 6.9 g/dL (ref 6.1–8.1)

## 2018-04-06 LAB — CBC WITH DIFFERENTIAL/PLATELET
Basophils Absolute: 78 cells/uL (ref 0–200)
Basophils Relative: 1.2 %
EOS PCT: 3.7 %
Eosinophils Absolute: 241 cells/uL (ref 15–500)
HEMATOCRIT: 36 % (ref 35.0–45.0)
Hemoglobin: 12.4 g/dL (ref 11.7–15.5)
LYMPHS ABS: 1703 {cells}/uL (ref 850–3900)
MCH: 32.6 pg (ref 27.0–33.0)
MCHC: 34.4 g/dL (ref 32.0–36.0)
MCV: 94.7 fL (ref 80.0–100.0)
MONOS PCT: 7.3 %
MPV: 12.8 fL — ABNORMAL HIGH (ref 7.5–12.5)
NEUTROS PCT: 61.6 %
Neutro Abs: 4004 cells/uL (ref 1500–7800)
Platelets: 163 10*3/uL (ref 140–400)
RBC: 3.8 10*6/uL (ref 3.80–5.10)
RDW: 12.5 % (ref 11.0–15.0)
Total Lymphocyte: 26.2 %
WBC mixed population: 475 cells/uL (ref 200–950)
WBC: 6.5 10*3/uL (ref 3.8–10.8)

## 2018-04-06 LAB — LIPID PANEL
CHOLESTEROL: 222 mg/dL — AB (ref <200)
HDL: 64 mg/dL (ref >50)
LDL Cholesterol (Calc): 142 mg/dL (calc) — ABNORMAL HIGH
NON-HDL CHOLESTEROL (CALC): 158 mg/dL — AB (ref <130)
TRIGLYCERIDES: 66 mg/dL (ref <150)
Total CHOL/HDL Ratio: 3.5 (calc) (ref <5.0)

## 2018-04-06 LAB — TSH: TSH: 0.9 m[IU]/L (ref 0.40–4.50)

## 2018-04-06 MED ORDER — CEPHALEXIN 500 MG PO CAPS: 500.0000 mg | ORAL_CAPSULE | Freq: Four times a day (QID) | ORAL | 0 refills | Status: DC

## 2018-04-06 NOTE — Patient Instructions (Addendum)
I am prescribing cephalexin to take for your skin infection   4 times daily for one week  Do not pick at it.  Apply a Hot wash cloth for 15 minutes once or twice daily    Please take a probiotic ( Align, Floraque or Culturelle) or the generic version of one of these  For a minimum of 3 weeks to prevent a serious antibiotic associated diarrhea  Called clostridium dificile colitis   I have made a referral  To [facility] In  [place] to evaluate your heart     Health Maintenance for Postmenopausal Women Menopause is a normal process in which your reproductive ability comes to an end. This process happens gradually over a span of months to years, usually between the ages of 62 and 35. Menopause is complete when you have missed 12 consecutive menstrual periods. It is important to talk with your health care provider about some of the most common conditions that affect postmenopausal women, such as heart disease, cancer, and bone loss (osteoporosis). Adopting a healthy lifestyle and getting preventive care can help to promote your health and wellness. Those actions can also lower your chances of developing some of these common conditions. What should I know about menopause? During menopause, you may experience a number of symptoms, such as:  Moderate-to-severe hot flashes.  Night sweats.  Decrease in sex drive.  Mood swings.  Headaches.  Tiredness.  Irritability.  Memory problems.  Insomnia.  Choosing to treat or not to treat menopausal changes is an individual decision that you make with your health care provider. What should I know about hormone replacement therapy and supplements? Hormone therapy products are effective for treating symptoms that are associated with menopause, such as hot flashes and night sweats. Hormone replacement carries certain risks, especially as you become older. If you are thinking about using estrogen or estrogen with progestin treatments, discuss  the benefits and risks with your health care provider. What should I know about heart disease and stroke? Heart disease, heart attack, and stroke become more likely as you age. This may be due, in part, to the hormonal changes that your body experiences during menopause. These can affect how your body processes dietary fats, triglycerides, and cholesterol. Heart attack and stroke are both medical emergencies. There are many things that you can do to help prevent heart disease and stroke:  Have your blood pressure checked at least every 1-2 years. High blood pressure causes heart disease and increases the risk of stroke.  If you are 17-8 years old, ask your health care provider if you should take aspirin to prevent a heart attack or a stroke.  Do not use any tobacco products, including cigarettes, chewing tobacco, or electronic cigarettes. If you need help quitting, ask your health care provider.  It is important to eat a healthy diet and maintain a healthy weight. ? Be sure to include plenty of vegetables, fruits, low-fat dairy products, and lean protein. ? Avoid eating foods that are high in solid fats, added sugars, or salt (sodium).  Get regular exercise. This is one of the most important things that you can do for your health. ? Try to exercise for at least 150 minutes each week. The type of exercise that you do should increase your heart rate and make you sweat. This is known as moderate-intensity exercise. ? Try to do strengthening exercises at least twice each week. Do these in addition to the moderate-intensity exercise.  Know your numbers.Ask your health  care provider to check your cholesterol and your blood glucose. Continue to have your blood tested as directed by your health care provider.  What should I know about cancer screening? There are several types of cancer. Take the following steps to reduce your risk and to catch any cancer development as early as possible. Breast  Cancer  Practice breast self-awareness. ? This means understanding how your breasts normally appear and feel. ? It also means doing regular breast self-exams. Let your health care provider know about any changes, no matter how small.  If you are 32 or older, have a clinician do a breast exam (clinical breast exam or CBE) every year. Depending on your age, family history, and medical history, it may be recommended that you also have a yearly breast X-ray (mammogram).  If you have a family history of breast cancer, talk with your health care provider about genetic screening.  If you are at high risk for breast cancer, talk with your health care provider about having an MRI and a mammogram every year.  Breast cancer (BRCA) gene test is recommended for women who have family members with BRCA-related cancers. Results of the assessment will determine the need for genetic counseling and BRCA1 and for BRCA2 testing. BRCA-related cancers include these types: ? Breast. This occurs in males or females. ? Ovarian. ? Tubal. This may also be called fallopian tube cancer. ? Cancer of the abdominal or pelvic lining (peritoneal cancer). ? Prostate. ? Pancreatic.  Cervical, Uterine, and Ovarian Cancer Your health care provider may recommend that you be screened regularly for cancer of the pelvic organs. These include your ovaries, uterus, and vagina. This screening involves a pelvic exam, which includes checking for microscopic changes to the surface of your cervix (Pap test).  For women ages 21-65, health care providers may recommend a pelvic exam and a Pap test every three years. For women ages 74-65, they may recommend the Pap test and pelvic exam, combined with testing for human papilloma virus (HPV), every five years. Some types of HPV increase your risk of cervical cancer. Testing for HPV may also be done on women of any age who have unclear Pap test results.  Other health care providers may not  recommend any screening for nonpregnant women who are considered low risk for pelvic cancer and have no symptoms. Ask your health care provider if a screening pelvic exam is right for you.  If you have had past treatment for cervical cancer or a condition that could lead to cancer, you need Pap tests and screening for cancer for at least 20 years after your treatment. If Pap tests have been discontinued for you, your risk factors (such as having a new sexual partner) need to be reassessed to determine if you should start having screenings again. Some women have medical problems that increase the chance of getting cervical cancer. In these cases, your health care provider may recommend that you have screening and Pap tests more often.  If you have a family history of uterine cancer or ovarian cancer, talk with your health care provider about genetic screening.  If you have vaginal bleeding after reaching menopause, tell your health care provider.  There are currently no reliable tests available to screen for ovarian cancer.  Lung Cancer Lung cancer screening is recommended for adults 28-69 years old who are at high risk for lung cancer because of a history of smoking. A yearly low-dose CT scan of the lungs is recommended if you:  Currently smoke.  Have a history of at least 30 pack-years of smoking and you currently smoke or have quit within the past 15 years. A pack-year is smoking an average of one pack of cigarettes per day for one year.  Yearly screening should:  Continue until it has been 15 years since you quit.  Stop if you develop a health problem that would prevent you from having lung cancer treatment.  Colorectal Cancer  This type of cancer can be detected and can often be prevented.  Routine colorectal cancer screening usually begins at age 3 and continues through age 86.  If you have risk factors for colon cancer, your health care provider may recommend that you be screened  at an earlier age.  If you have a family history of colorectal cancer, talk with your health care provider about genetic screening.  Your health care provider may also recommend using home test kits to check for hidden blood in your stool.  A small camera at the end of a tube can be used to examine your colon directly (sigmoidoscopy or colonoscopy). This is done to check for the earliest forms of colorectal cancer.  Direct examination of the colon should be repeated every 5-10 years until age 52. However, if early forms of precancerous polyps or small growths are found or if you have a family history or genetic risk for colorectal cancer, you may need to be screened more often.  Skin Cancer  Check your skin from head to toe regularly.  Monitor any moles. Be sure to tell your health care provider: ? About any new moles or changes in moles, especially if there is a change in a mole's shape or color. ? If you have a mole that is larger than the size of a pencil eraser.  If any of your family members has a history of skin cancer, especially at a young age, talk with your health care provider about genetic screening.  Always use sunscreen. Apply sunscreen liberally and repeatedly throughout the day.  Whenever you are outside, protect yourself by wearing long sleeves, pants, a wide-brimmed hat, and sunglasses.  What should I know about osteoporosis? Osteoporosis is a condition in which bone destruction happens more quickly than new bone creation. After menopause, you may be at an increased risk for osteoporosis. To help prevent osteoporosis or the bone fractures that can happen because of osteoporosis, the following is recommended:  If you are 39-19 years old, get at least 1,000 mg of calcium and at least 600 mg of vitamin D per day.  If you are older than age 2 but younger than age 73, get at least 1,200 mg of calcium and at least 600 mg of vitamin D per day.  If you are older than age 65,  get at least 1,200 mg of calcium and at least 800 mg of vitamin D per day.  Smoking and excessive alcohol intake increase the risk of osteoporosis. Eat foods that are rich in calcium and vitamin D, and do weight-bearing exercises several times each week as directed by your health care provider. What should I know about how menopause affects my mental health? Depression may occur at any age, but it is more common as you become older. Common symptoms of depression include:  Low or sad mood.  Changes in sleep patterns.  Changes in appetite or eating patterns.  Feeling an overall lack of motivation or enjoyment of activities that you previously enjoyed.  Frequent crying spells.  Talk with your health care provider if you think that you are experiencing depression. What should I know about immunizations? It is important that you get and maintain your immunizations. These include:  Tetanus, diphtheria, and pertussis (Tdap) booster vaccine.  Influenza every year before the flu season begins.  Pneumonia vaccine.  Shingles vaccine.  Your health care provider may also recommend other immunizations. This information is not intended to replace advice given to you by your health care provider. Make sure you discuss any questions you have with your health care provider. Document Released: 06/03/2005 Document Revised: 10/30/2015 Document Reviewed: 01/13/2015 Elsevier Interactive Patient Education  2018 [employer].

## 2018-04-06 NOTE — Progress Notes (Signed)
Patient ID: Sarah [name], female    DOB: Nov 05, 1950  Age: 67 y.o. MRN: [id number]  The patient is here for annual preventive examination and management of other chronic and acute problems.   Normal mammogram June 2019  Colonoscopy normal June  2016    The risk factors are reflected in the social history.  The roster of all physicians providing medical care to patient - is listed in the Snapshot section of the chart.  Activities of daily living:  The patient is 100% independent in all ADLs: dressing, toileting, feeding as well as independent mobility  Home safety : The patient has smoke detectors in the home. They wear seatbelts.  There are no firearms at home. There is no violence in the home.   There is no risks for hepatitis, STDs or HIV. There is no   history of blood transfusion. They have no travel history to infectious disease endemic areas of the world.  The patient has seen their dentist in the last six month. They have seen their eye doctor in the last year. They admit to slight hearing difficulty with regard to whispered voices and some television programs.  They have deferred audiologic testing in the last year.  They do not  have excessive sun exposure. Discussed the need for sun protection: hats, long sleeves and use of sunscreen if there is significant sun exposure.   Diet: the importance of a healthy diet is discussed. They do have a healthy diet.  The benefits of regular aerobic exercise were discussed. She walks 4 times per week ,  20 minutes.   Depression screen: there are no signs or vegative symptoms of depression- irritability, change in appetite, anhedonia, sadness/tearfullness.  Cognitive assessment: the patient manages all their financial and personal affairs and is actively engaged. They could relate day,date,year and events; recalled 2/3 objects at 3 minutes; performed clock-face test normally.  The following portions of the patient's history were reviewed and  updated as appropriate: allergies, current medications, past family history, past medical history,  past surgical history, past social history  and problem list.  Visual acuity was not assessed per patient preference since she has regular follow up with her ophthalmologist. Hearing and body mass index were assessed and reviewed.   During the course of the visit the patient was educated and counseled about appropriate screening and preventive services including : fall prevention , diabetes screening, nutrition counseling, colorectal cancer screening, and recommended immunizations.    CC: The primary encounter diagnosis was Dyspnea on exertion. Diagnoses of Hyperlipidemia LDL goal <130, Acquired hypothyroidism, and Encounter for preventive health examination were also pertinent to this visit.  1) dyspnea with physical exertion .  Some chest tighteness improves with rest.  Worried it's her heart   2) boil on right cheek started on Sunday with tender subcutaneous lump,  Has been picking at it   History Sarah [name] has a past medical history of Arthritis, Asthma, Depression, Dyspnea, Environmental and seasonal allergies, GERD (gastroesophageal reflux disease), and Wheezing.   She has a past surgical history that includes Abdominal hysterectomy (1989); Foot surgery (Left, 1985); Knee surgery (2016); Colonoscopy with propofol (N/A, 09/30/2014); Esophagogastroduodenoscopy (N/A, 09/30/2014); Cataract extraction w/PHACO (Right, 09/13/2016); and Cataract extraction w/PHACO (Left, 10/11/2016).   Her family history includes Diabetes in her mother; Heart disease in her mother; Hyperlipidemia in her mother.She reports that she has never smoked. She has never used smokeless tobacco. She reports that she does not drink alcohol or use drugs.  Outpatient  Medications Prior to Visit  Medication Sig Dispense Refill  . albuterol (PROVENTIL HFA;VENTOLIN HFA) 108 (90 Base) MCG/ACT inhaler Inhale 2 puffs into the lungs every 6  (six) hours as needed for wheezing or shortness of breath. 1 Inhaler 11  . Calcium Carb-Cholecalciferol (CALCIUM 600 + D PO) Take 2 tablets by mouth daily.    . citalopram (CELEXA) 20 MG tablet Take 1 tablet (20 mg total) by mouth daily. 30 tablet 11  . famotidine (PEPCID) 20 MG tablet TAKE 1 TABLET BY MOUTH DAILY. BEFORE DINNER 30 tablet 11  . fexofenadine (ALLEGRA) 180 MG tablet Take 180 mg by mouth daily.    Marland Kitchen ibuprofen (ADVIL,MOTRIN) 200 MG tablet Take 600 mg by mouth daily as needed for moderate pain.     Marland Kitchen levothyroxine (SYNTHROID, LEVOTHROID) 50 MCG tablet Take 1 tablet (50 mcg total) by mouth daily. In the morning one hour before meal 90 tablet 3  . Omega-3 Fatty Acids (FISH OIL) 1200 MG CAPS Take 1,200 mg by mouth daily.     . pantoprazole (PROTONIX) 40 MG tablet TAKE 1 TABLET BY MOUTH DAILY. IN AM 30 MINUTES BEFORE EATING 30 tablet 11   No facility-administered medications prior to visit.     Review of Systems   Patient denies headache, fevers, malaise, unintentional weight loss, skin rash, eye pain, sinus congestion and sinus pain, sore throat, dysphagia,  hemoptysis , cough, dyspnea, wheezing, chest pain, palpitations, orthopnea, edema, abdominal pain, nausea, melena, diarrhea, constipation, flank pain, dysuria, hematuria, urinary  Frequency, nocturia, numbness, tingling, seizures,  Focal weakness, Loss of consciousness,  Tremor, insomnia, depression, anxiety, and suicidal ideation.      Objective:  BP (!) 144/64 (BP Location: Left Arm, Patient Position: Sitting, Cuff Size: Large)   Pulse 87   Temp (!) 97.3 F (36.3 C) (Oral)   Resp 15   Ht 5\' 6"  (1.676 m)   Wt 185 lb 3.2 oz (84 kg)   SpO2 97%   BMI 29.89 kg/m   Physical Exam  General appearance: alert, cooperative and appears stated age Head: Normocephalic, without obvious abnormality, atraumatic Eyes: conjunctivae/corneas clear. PERRL, EOM's intact. Fundi benign. Ears: normal TM's and external ear canals both  ears Nose: Nares normal. Septum midline. Mucosa normal. No drainage or sinus tenderness. Throat: lips, mucosa, and tongue normal; teeth and gums normal Neck: no adenopathy, no carotid bruit, no JVD, supple, symmetrical, trachea midline and thyroid not enlarged, symmetric, no tenderness/mass/nodules Lungs: clear to auscultation bilaterally Breasts: normal appearance, no masses or tenderness Heart: regular rate and rhythm, S1, S2 normal, no murmur, click, rub or gallop Abdomen: soft, non-tender; bowel sounds normal; no masses,  no organomegaly Extremities: extremities normal, atraumatic, no cyanosis or edema Pulses: 2+ and symmetric Skin: Skin color, texture, turgor normal. No rashes or lesions Neurologic: Alert and oriented X 3, normal strength and tone. Normal symmetric reflexes. Normal coordination and gait.    Assessment & Plan:   Problem List Items Addressed This Visit    Dyspnea on exertion - Primary    Referral to cardiology for cardiac evaluation       Relevant Orders   EKG 12-Lead (Completed)   Ambulatory referral to Cardiology   CBC with Differential/Platelet (Completed)   Encounter for preventive health examination    age appropriate education and counseling updated, referrals for preventative services and immunizations addressed, dietary and smoking counseling addressed, most recent labs reviewed.  I have personally reviewed and have noted:  1) the patient's medical and social history 2)  The pt's use of alcohol, tobacco, and illicit drugs 3) The patient's current medications and supplements 4) Functional ability including ADL's, fall risk, home safety risk, hearing and visual impairment 5) Diet and physical activities 6) Evidence for depression or mood disorder 7) The patient's height, weight, and BMI have been recorded in the chart  I have made referrals, and provided counseling and education based on review of the above      Hyperlipidemia LDL goal <130    She did  not tolerate trial of red yeast rice.  10 yr risk of CAD is < 7% by today's calculation.   Lab Results  Component Value Date   CHOL 222 (H) 04/06/2018   HDL 64 04/06/2018   LDLCALC 142 (H) 04/06/2018   TRIG 66 04/06/2018   CHOLHDL 3.5 04/06/2018           Relevant Orders   Comprehensive metabolic panel (Completed)   Lipid panel (Completed)   Hypothyroid    Thyroid function is WNL on current dose.  No current changes needed.  Explained that she will need to continue medication for life.   Lab Results  Component Value Date   TSH 0.90 04/06/2018         Relevant Orders   TSH (Completed)      I am having Skylan C. Wadlow start on cephALEXin. I am also having her maintain her Fish Oil, ibuprofen, albuterol, fexofenadine, Calcium Carb-Cholecalciferol (CALCIUM 600 + D PO), levothyroxine, famotidine, pantoprazole, and citalopram.  Meds ordered this encounter  Medications  . cephALEXin (KEFLEX) 500 MG capsule    Sig: Take 1 capsule (500 mg total) by mouth 4 (four) times daily.    Dispense:  28 capsule    Refill:  0    There are no discontinued medications.  Follow-up: No follow-ups on file.   Crecencio Mc, MD

## 2018-04-07 ENCOUNTER — Other Ambulatory Visit: Payer: Self-pay | Admitting: Internal Medicine

## 2018-04-08 DIAGNOSIS — R0602 Shortness of breath: Secondary | ICD-10-CM | POA: Insufficient documentation

## 2018-04-08 NOTE — Assessment & Plan Note (Signed)

## 2018-04-08 NOTE — Assessment & Plan Note (Signed)
Thyroid function is WNL on current dose.  No current changes needed.  Explained that she will need to continue medication for life.   Lab Results  Component Value Date   TSH 0.90 04/06/2018

## 2018-04-08 NOTE — Assessment & Plan Note (Signed)
Referral to cardiology for cardiac evaluation

## 2018-04-08 NOTE — Assessment & Plan Note (Signed)
She did not tolerate trial of red yeast rice.  10 yr risk of CAD is < 7% by today's calculation.   Lab Results  Component Value Date   CHOL 222 (H) 04/06/2018   HDL 64 04/06/2018   LDLCALC 142 (H) 04/06/2018   TRIG 66 04/06/2018   CHOLHDL 3.5 04/06/2018

## 2018-04-09 ENCOUNTER — Ambulatory Visit (INDEPENDENT_AMBULATORY_CARE_PROVIDER_SITE_OTHER): Payer: Medicare Other

## 2018-04-09 VITALS — BP 146/76 | HR 80 | Temp 97.8°F | Resp 16 | Ht 66.0 in | Wt 185.8 lb

## 2018-04-09 DIAGNOSIS — Z0000 Encounter for general adult medical examination without abnormal findings: Secondary | ICD-10-CM | POA: Diagnosis not present

## 2018-04-09 NOTE — Progress Notes (Addendum)
Subjective:   Sarah [name] is a 67 y.o. female who presents for an Initial Medicare Annual Wellness Visit.  Review of Systems    No ROS.  Medicare Wellness Visit. Additional risk factors are reflected in the social history.  Cardiac Risk Factors include: advanced age (>68men, >34 women)     Objective:    Today's Vitals   04/09/18 0941  BP: (!) 146/76  Pulse: 80  Resp: 16  Temp: 97.8 F (36.6 C)  TempSrc: Oral  SpO2: 95%  Weight: 185 lb 12.8 oz (84.3 kg)  Height: 5\' 6"  (1.676 m)   Body mass index is 29.99 kg/m.  Advanced Directives 04/09/2018 10/11/2016 09/13/2016 09/01/2014  Does Patient Have a Medical Advance Directive? Yes No No No  Type of Paramedic of [place];Living will - - -  Does patient want to make changes to medical advance directive? No - Patient declined - - -  Copy of [place] in Chart? No - copy requested - - -  Would patient like information on creating a medical advance directive? - No - Patient declined - Yes - Educational materials given    Current Medications (verified) Outpatient Encounter Medications as of 04/09/2018  Medication Sig  . albuterol (PROVENTIL HFA;VENTOLIN HFA) 108 (90 Base) MCG/ACT inhaler Inhale 2 puffs into the lungs every 6 (six) hours as needed for wheezing or shortness of breath.  . Calcium Carb-Cholecalciferol (CALCIUM 600 + D PO) Take 2 tablets by mouth daily.  . cephALEXin (KEFLEX) 500 MG capsule Take 1 capsule (500 mg total) by mouth 4 (four) times daily.  . citalopram (CELEXA) 20 MG tablet Take 1 tablet (20 mg total) by mouth daily.  . famotidine (PEPCID) 20 MG tablet TAKE 1 TABLET BY MOUTH DAILY. BEFORE DINNER  . fexofenadine (ALLEGRA) 180 MG tablet Take 180 mg by mouth daily.  Marland Kitchen ibuprofen (ADVIL,MOTRIN) 200 MG tablet Take 600 mg by mouth daily as needed for moderate pain.   Marland Kitchen levothyroxine (SYNTHROID, LEVOTHROID) 50 MCG tablet TAKE 1 TABLET BY MOUTH DAILY  . Omega-3 Fatty  Acids (FISH OIL) 1200 MG CAPS Take 1,200 mg by mouth daily.   . pantoprazole (PROTONIX) 40 MG tablet TAKE 1 TABLET BY MOUTH DAILY. IN AM 30 MINUTES BEFORE EATING  . [DISCONTINUED] levothyroxine (SYNTHROID, LEVOTHROID) 50 MCG tablet Take 1 tablet (50 mcg total) by mouth daily. In the morning one hour before meal   No facility-administered encounter medications on file as of 04/09/2018.     Allergies (verified) Patient has no known allergies.   History: Past Medical History:  Diagnosis Date  . Arthritis   . Asthma   . Depression   . Dyspnea    with heat  . Environmental and seasonal allergies   . GERD (gastroesophageal reflux disease)   . Wheezing    Past Surgical History:  Procedure Laterality Date  . ABDOMINAL HYSTERECTOMY  1989  . CATARACT EXTRACTION W/PHACO Right 09/13/2016   Procedure: CATARACT EXTRACTION PHACO AND INTRAOCULAR LENS PLACEMENT (IOC);  Surgeon: Birder Robson, MD;  Location: [facility] ORS;  Service: Ophthalmology;  Laterality: Right;  [place] 00:38 AP% 14.3 CDE 5.51 Fluid pack lot # [id number]  . CATARACT EXTRACTION W/PHACO Left 10/11/2016   Procedure: CATARACT EXTRACTION PHACO AND INTRAOCULAR LENS PLACEMENT (IOC);  Surgeon: Birder Robson, MD;  Location: [facility] ORS;  Service: Ophthalmology;  Laterality: Left;  [place] 00:30 AP% 11.3 CDE 3.50 fluid pack lot # [id number]  . COLONOSCOPY WITH PROPOFOL N/A 09/30/2014   Procedure:  COLONOSCOPY WITH PROPOFOL;  Surgeon: Robert Bellow, MD;  Location[facility] ENDOSCOPY;  Service: Endoscopy;  Laterality: N/A;  . ESOPHAGOGASTRODUODENOSCOPY N/A 09/30/2014   Procedure: ESOPHAGOGASTRODUODENOSCOPY (EGD);  Surgeon: Robert Bellow, MD;  Location: [facility] ENDOSCOPY;  Service: Endoscopy;  Laterality: N/A;  . FOOT SURGERY Left 1985  . KNEE SURGERY  2016   Family History  Problem Relation Age of Onset  . Diabetes Mother   . Hyperlipidemia Mother   . Heart disease Mother    Social History   Socioeconomic History  . Marital status: Single     Spouse name: Not on file  . Number of children: Not on file  . Years of education: Not on file  . Highest education level: Not on file  Occupational History  . Not on file  Social Needs  . Financial resource strain: Not on file  . Food insecurity:    Worry: Never true    Inability: Never true  . Transportation needs:    Medical: No    Non-medical: No  Tobacco Use  . Smoking status: Never Smoker  . Smokeless tobacco: Never Used  Substance and Sexual Activity  . Alcohol use: No  . Drug use: No  . Sexual activity: Never  Lifestyle  . Physical activity:    Days per week: Not on file    Minutes per session: Not on file  . Stress: Not on file  Relationships  . Social connections:    Talks on phone: Not on file    Gets together: Not on file    Attends religious service: Not on file    Active member of club or organization: Not on file    Attends meetings of clubs or organizations: Not on file    Relationship status: Not on file  Other Topics Concern  . Not on file  Social History Narrative  . Not on file    Tobacco Counseling Counseling given: Not Answered   Clinical Intake:  Pre-visit preparation completed: Yes  Pain : No/denies pain     Diabetes: No  How often do you need to have someone help you when you read instructions, pamphlets, or other written materials from your doctor or pharmacy?: 1 - Never  Interpreter Needed?: No      Activities of Daily Living In your present state of health, do you have any difficulty performing the following activities: 04/09/2018  Hearing? N  Vision? N  Difficulty concentrating or making decisions? Y  Comment Notes she has difficulty focusing at times and this is not new for her. States it has been difficult remembering and concentrating since a school age child.   Walking or climbing stairs? N  Dressing or bathing? N  Doing errands, shopping? N  Preparing Food and eating ? Y  Comment She does not cook. Dines out.  Self feeds.   Using the Toilet? N  In the past six months, have you accidently leaked urine? Y  Comment Mainly at night; managed with a brief  Do you have problems with loss of bowel control? N  Managing your Medications? N  Managing your Finances? N  Housekeeping or managing your Housekeeping? N  Some recent data might be hidden     Immunizations and Health Maintenance Immunization History  Administered Date(s) Administered  . Influenza, High Dose Seasonal PF 04/22/2016, 06/05/2017  . Influenza,inj,Quad PF,6+ Mos 04/26/2013, 01/30/2015  . Influenza-Unspecified 01/24/2018  . Pneumococcal Conjugate-13 09/01/2014, 01/24/2018  . Pneumococcal Polysaccharide-23 04/26/2013  . Tdap 06/18/2014  .  Zoster Recombinat (Shingrix) 07/08/2017, 09/22/2017   There are no preventive care reminders to display for this patient.  Patient Care Team: Crecencio Mc, MD as PCP - General (Internal Medicine) Crecencio Mc, MD (Internal Medicine) Bary Castilla Forest Gleason, MD (General Surgery)  Indicate any recent Medical Services you may have received from other than [facility] providers in the past year (date may be approximate).     Assessment:   This is a routine wellness examination for Jaquasia.  Refill request for levothyroxine sent.   Health Screenings  Mammogram-09/29/17 Colonoscopy -09/30/2014 Bone Density -09/30/2014 Glaucoma -none reported Hearing-some difficulty hearing some conversational tones in a crowd, audiology deferred per patient preference.  Glucose- 04/06/18 (146) Cholesterol-04/06/18 (222) Dental-visits every 6 months  Social  Alcohol intake -none Smoking history- none Smokers in home? none Illicit drug use?none Exercise -none.  Plans to walk daily for exercise, 15 minutes Diet- regular; she does not monitor  Safety  Patient feels safe at home.  Patient does have smoke detectors at home  Patient does wear sunscreen or protective clothing when in direct sunlight  Patient does  wear seat belt when driving or riding with others.   Activities of Daily Living Patient can do their own household chores. Denies needing assistance with: driving, feeding themselves, getting from bed to chair, getting to the toilet, bathing/showering, dressing, managing money, climbing flight of stairs. Notes she does not cook. Dines out, self feeds.   Depression Screen Patient denies losing interest in daily life, feeling hopeless, or crying easily over simple problems.   Fall Screen Patient denies being afraid of falling or falling in the last year.   Memory Screen Patient states she is able to balance checkbook/bank accounts.  Patient is alert, normal appearance, oriented to person/place/and time. Correctly identified the president of the [place],. Recall of 1/3 objects.  Difficulty performing calculations of subtraction by 3's from number 30.  Patient can read correct time from watch face; difficulty drawing clock face and time.  Notes she believes she has a learning disability since her childhood.   Brain engagement activities discussed.  Declines further intervention at this time.    Immunizations The following Immunizations are up to date: Influenza, shingles, pneumonia, and tetanus.   Other Providers Patient Care Team: Crecencio Mc, MD as PCP - General (Internal Medicine) Crecencio Mc, MD (Internal Medicine) Bary Castilla Forest Gleason, MD (General Surgery)  Hearing/Vision screen Hearing Screening Comments: Patient is able to hear conversational tones without difficulty.  No issues reported.   Vision Screening Comments: Followed by Dr. Leonia Corona. At [facility]. Vision acuity not assessed per patient preference since she has regular follow up with her ophthalmologist.  Wears corrective lenses.   Dietary issues and exercise activities discussed: Current Exercise Habits: The patient does not participate in regular exercise at present  Goals      Patient  Stated   . DIET - INCREASE WATER INTAKE (pt-stated)    . DIET - REDUCE SUGAR INTAKE (pt-stated)     Low Carb Diet Portion control Healthy choices    . Increase physical activity (pt-stated)     Walk for exercise      Depression Screen PHQ 2/9 Scores 04/09/2018 06/05/2017 04/22/2016 09/01/2014 08/15/2014  PHQ - 2 Score 1 0 0 1 0  PHQ- 9 Score - 0 - - -    Fall Risk Fall Risk  04/06/2018 04/22/2016 09/01/2014 08/15/2014  Falls in the past year? 0 No Yes Yes  Number falls  in past yr: - - 1 1  Comment - - fell in sept 2015 -  Injury with Fall? - - Yes Yes  Comment - - Had to have knee surgery  injured right knee torn meniscus.  Risk for fall due to : - - Impaired balance/gait -  Follow up - - Falls evaluation completed Falls prevention discussed   Cognitive Function: MMSE - Mini Mental State Exam 04/09/2018 09/01/2014  Orientation to time 4 5  Orientation to time comments Difficulty drawing clock face with time.  -  Orientation to Place 5 5  Registration 3 3  Attention/ Calculation 2 5  Attention/Calculation-comments Difficulty with calculation -  Recall 1 3  Recall-comments 1 out of 3 recall -  Language- name 2 objects 2 2  Language- repeat 1 1  Language- follow 3 step command 3 3  Language- read & follow direction 1 1  Write a sentence 0 1  Write a sentence-comments Difficulty completing sentence.  -  Copy design 1 1  Total score 23 30       Screening Tests Health Maintenance  Topic Date Due  . PNA vac Low Risk Adult (2 of 2 - PPSV23) 01/25/2019  . MAMMOGRAM  10/10/2019  . TETANUS/TDAP  06/18/2024  . COLONOSCOPY  09/29/2024  . INFLUENZA VACCINE  Completed  . DEXA SCAN  Completed  . Hepatitis C Screening  Completed     Plan:    End of life planning; Advance aging; Advanced directives discussed. Copy of current HCPOA/Living Will requested.    I have personally reviewed and noted the following in the patient's chart:   . Medical and social history . Use of  alcohol, tobacco or illicit drugs  . Current medications and supplements . Functional ability and status . Nutritional status . Physical activity . Advanced directives . List of other physicians . Hospitalizations, surgeries, and ER visits in previous 12 months . Vitals . Screenings to include cognitive, depression, and falls . Referrals and appointments  In addition, I have reviewed and discussed with patient certain preventive protocols, quality metrics, and best practice recommendations. A written personalized care plan for preventive services as well as general preventive health recommendations were provided to patient.     OBrien-Blaney, Denisa L, LPN   32/44/0102    I have reviewed the above information and agree with above.   Deborra Medina, MD

## 2018-04-09 NOTE — Patient Instructions (Addendum)
  Sarah [name] , Thank you for taking time to come for your Medicare Wellness Visit. I appreciate your ongoing commitment to your health goals. Please review the following plan we discussed and let me know if I can assist you in the future.   Refill request of levothyroxine sent to pharmacy.   Keep all routine maintenance appointments.    Happy Holidays!  These are the goals we discussed: Goals      Patient Stated   . DIET - INCREASE WATER INTAKE (pt-stated)    . DIET - REDUCE SUGAR INTAKE (pt-stated)     Low Carb Diet Portion control Healthy choices    . Increase physical activity (pt-stated)     Walk for exercise       This is a list of the screening recommended for you and due dates:  Health Maintenance  Topic Date Due  . Pneumonia vaccines (2 of 2 - PPSV23) 01/25/2019  . Mammogram  10/10/2019  . Tetanus Vaccine  06/18/2024  . Colon Cancer Screening  09/29/2024  . Flu Shot  Completed  . DEXA scan (bone density measurement)  Completed  .  Hepatitis C: One time screening is recommended by Center for Disease Control  (CDC) for  adults born from [birth year] through 1966.   Completed

## 2018-06-04 DIAGNOSIS — J45909 Unspecified asthma, uncomplicated: Secondary | ICD-10-CM | POA: Insufficient documentation

## 2018-06-04 DIAGNOSIS — J45901 Unspecified asthma with (acute) exacerbation: Secondary | ICD-10-CM | POA: Insufficient documentation

## 2018-06-26 DIAGNOSIS — R0789 Other chest pain: Secondary | ICD-10-CM | POA: Insufficient documentation

## 2018-06-26 NOTE — Progress Notes (Signed)
Cardiology Office Note  Date:  06/27/2018   ID:  Vonnetta, Akey 11-15-1950, MRN [id number]  PCP:  Crecencio Mc, MD   Chief Complaint  Patient presents with  . Other    Referred by PCP for SOB and weakness. Patient c/o increased SOB. Patient has a cough and is wheezing. Meds reviewed verbally with patient.     HPI:  Ms. 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 Derrel Nip for SOB, weakness  SOB started 10 years, after she lost her mom dyspnea with physical exertion .   Some chest tighteness,  improves with rest.   Reports having some family history, wants to make sure she does not have a blockage  URI  started yesterday Cough, Chills  Significant bronchospastic cough today, taking her albuterol inhaler  No regular exercise program  Cholesterol elevated, not on a cholesterol medication No prior cardiac work-up, no prior cardiac imaging  EKG personally reviewed by myself on todays visit Shows normal sinus rhythm with rate 78 bpm no significant ST or T wave changes  PMH:   has a past medical history of Arthritis, Asthma, Depression, Dyspnea, Environmental and seasonal allergies, GERD (gastroesophageal reflux disease), and Wheezing.  PSH:    Past Surgical History:  Procedure Laterality Date  . ABDOMINAL HYSTERECTOMY  1989  . CATARACT EXTRACTION W/PHACO Right 09/13/2016   Procedure: CATARACT EXTRACTION PHACO AND INTRAOCULAR LENS PLACEMENT (IOC);  Surgeon: Birder Robson, MD;  Location: [facility] ORS;  Service: Ophthalmology;  Laterality: Right;  [place] 00:38 AP% 14.3 CDE 5.51 Fluid pack lot # [id number]  . CATARACT EXTRACTION W/PHACO Left 10/11/2016   Procedure: CATARACT EXTRACTION PHACO AND INTRAOCULAR LENS PLACEMENT (IOC);  Surgeon: Birder Robson, MD;  Location: [facility] ORS;  Service: Ophthalmology;   Laterality: Left;  [place] 00:30 AP% 11.3 CDE 3.50 fluid pack lot # [id number]  . COLONOSCOPY WITH PROPOFOL N/A 09/30/2014   Procedure: COLONOSCOPY WITH PROPOFOL;  Surgeon: Robert Bellow, MD;  Location[facility] ENDOSCOPY;  Service: Endoscopy;  Laterality: N/A;  . ESOPHAGOGASTRODUODENOSCOPY N/A 09/30/2014   Procedure: ESOPHAGOGASTRODUODENOSCOPY (EGD);  Surgeon: Robert Bellow, MD;  Location: [facility] ENDOSCOPY;  Service: Endoscopy;  Laterality: N/A;  . FOOT SURGERY Left 1985  . KNEE SURGERY  2016    Current Outpatient Medications  Medication Sig Dispense Refill  . albuterol (PROVENTIL HFA;VENTOLIN HFA) 108 (90 Base) MCG/ACT inhaler Inhale 2 puffs into the lungs every 6 (six) hours as needed for wheezing or shortness of breath. 1 Inhaler 11  . Calcium Carb-Cholecalciferol (CALCIUM 600 + D PO) Take 2 tablets by mouth daily.    . citalopram (CELEXA) 20 MG tablet Take 1 tablet (20 mg total) by mouth daily. 30 tablet 11  . famotidine (PEPCID) 20 MG tablet TAKE 1 TABLET BY MOUTH DAILY. BEFORE DINNER 30 tablet 11  . fexofenadine (ALLEGRA) 180 MG tablet Take 180 mg by mouth daily.    Marland Kitchen ibuprofen (ADVIL,MOTRIN) 200 MG tablet Take 600 mg by mouth daily as needed for moderate pain.     Marland Kitchen levothyroxine (SYNTHROID, LEVOTHROID) 50 MCG tablet TAKE 1 TABLET BY MOUTH DAILY 90 tablet 3  . Omega-3 Fatty Acids (FISH OIL) 1200 MG CAPS Take 1,200 mg by mouth daily.     . pantoprazole (PROTONIX) 40 MG tablet TAKE 1 TABLET BY MOUTH DAILY. IN AM 30 MINUTES BEFORE EATING 30 tablet 11   No current facility-administered  medications for this visit.      Allergies:   Patient has no known allergies.   Social History:  The patient  reports that she has never smoked. She has never used smokeless tobacco. She reports that she does not drink alcohol or use drugs.   Family History:   family history includes Diabetes in her mother; Heart disease in her mother; Hyperlipidemia in her mother.    Review of Systems: Review of Systems   Constitutional: Negative.   Respiratory: Positive for cough, shortness of breath and wheezing.   Cardiovascular: Negative.   Gastrointestinal: Negative.   Musculoskeletal: Negative.   Neurological: Negative.   Psychiatric/Behavioral: Negative.   All other systems reviewed and are negative. :  PHYSICAL EXAM: VS:  BP 116/66 (BP Location: Left Arm, Patient Position: Sitting, Cuff Size: Normal)   Pulse 78   Ht 5\' 3"  (1.6 m)   Wt 185 lb 8 oz (84.1 kg)   SpO2 97%   BMI 32.86 kg/m  , BMI Body mass index is 32.86 kg/m. GEN: Well nourished, well developed, in no acute distress  HEENT: normal  Neck: no JVD, carotid bruits, or masses Cardiac: RRR; no murmurs, rubs, or gallops,no edema  Respiratory:  clear to auscultation bilaterally, normal work of breathing GI: soft, nontender, nondistended, + BS MS: no deformity or atrophy  Skin: warm and dry, no rash Neuro:  Strength and sensation are intact Psych: euthymic mood, full affect  Recent Labs: 04/06/2018: ALT 9; BUN 12; Creat 0.84; Hemoglobin 12.4; Platelets 163; Potassium 4.1; Sodium 139; TSH 0.90    Lipid Panel Lab Results  Component Value Date   CHOL 222 (H) 04/06/2018   HDL 64 04/06/2018   LDLCALC 142 (H) 04/06/2018   TRIG 66 04/06/2018      Wt Readings from Last 3 Encounters:  06/27/18 185 lb 8 oz (84.1 kg)  04/09/18 185 lb 12.8 oz (84.3 kg)  04/06/18 185 lb 3.2 oz (84 kg)     ASSESSMENT AND PLAN:  SOB (shortness of breath) -  Reports having worsening shortness of breath over the past several months Concerned it might be cardiac etiology Also reports some occasional chest tightness on exertion Unable to treadmill given underlying lung disease We have ordered pharmacological Myoview and echocardiogram -----> she does report 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, standing -If cardiac work-up negative will need pulmonary work-up  Chest tightness -  As above,  ordered echocardiogram and Myoview Suspect symptoms likely are pulmonary though will rule out underlying ischemia  Uncomplicated asthma, unspecified asthma severity, unspecified whether persistent She has viral URI starting yesterday with chills fever worsening wheezing past 24 hours She is using her inhaler Even at baseline reports having shortness of breath from asthma  Mixed hyperlipidemia Cholesterol elevated Will defer to primary care, could consider starting low-dose statin Stress test pending  Disposition:   F/U as needed   Total encounter time more than 45 minutes  Greater than 50% was spent in counseling and coordination of care with the patient   Orders Placed This Encounter  Procedures  . NM Myocar Multi W/Spect W/Wall Motion / EF  . EKG 12-Lead  . ECHOCARDIOGRAM COMPLETE     Signed, Esmond Plants, M.D., Ph.D. 06/27/2018  [place], [place]

## 2018-06-27 ENCOUNTER — Encounter: Payer: Self-pay | Admitting: Cardiovascular Disease

## 2018-06-27 ENCOUNTER — Ambulatory Visit: Payer: Medicare Other | Admitting: Cardiovascular Disease

## 2018-06-27 VITALS — BP 116/66 | HR 78 | Ht 63.0 in | Wt 185.5 lb

## 2018-06-27 DIAGNOSIS — J45909 Unspecified asthma, uncomplicated: Secondary | ICD-10-CM | POA: Diagnosis not present

## 2018-06-27 DIAGNOSIS — R0789 Other chest pain: Secondary | ICD-10-CM

## 2018-06-27 DIAGNOSIS — R0602 Shortness of breath: Secondary | ICD-10-CM | POA: Diagnosis not present

## 2018-06-27 DIAGNOSIS — E782 Mixed hyperlipidemia: Secondary | ICD-10-CM

## 2018-06-27 NOTE — Patient Instructions (Addendum)
PLEASE CALL us TO SCHEDULE 1. LEXISCAN 2. ECHOCARDIOGRAM  [phone]  Medication Instructions:  No changes  If you need a refill on your cardiac medications before your next appointment, please call your pharmacy.    Lab work: No new labs needed   If you have labs (blood work) drawn today and your tests are completely normal, you will receive your results only by: Marland Kitchen MyChart Message (if you have MyChart) OR . A paper copy in the mail If you have any lab test that is abnormal or we need to change your treatment, we will call you to review the results.   Testing/Procedures: We will schedule an echocardiogram and lexiscan myoview For shortness of breath, unable to treadmill Your physician has requested that you have an echocardiogram. Echocardiography is a painless test that uses sound waves to create images of your heart. It provides your doctor with information about the size and shape of your heart and how well your heart's chambers and valves are working. This procedure takes approximately one hour. There are no restrictions for this procedure.   [place]  Your caregiver has ordered a Stress Test with nuclear imaging. The purpose of this test is to evaluate the blood supply to your heart muscle. This procedure is referred to as a "Non-Invasive Stress Test." This is because other than having an IV started in your vein, nothing is inserted or "invades" your body. Cardiac stress tests are done to find areas of poor blood flow to the heart by determining the extent of coronary artery disease (CAD). Some patients exercise on a treadmill, which naturally increases the blood flow to your heart, while others who are  unable to walk on a treadmill due to physical limitations have a pharmacologic/chemical stress agent called Lexiscan . This medicine will mimic walking on a treadmill by temporarily increasing your coronary blood flow.   Please note: these test may take anywhere between 2-4  hours to complete  PLEASE REPORT TO [place] AT THE FIRST DESK WILL DIRECT YOU WHERE TO GO  Date of Procedure:_____________________________________  Arrival Time for Procedure:______________________________    PLEASE NOTIFY THE OFFICE AT LEAST 24 HOURS IN ADVANCE IF YOU ARE UNABLE TO KEEP YOUR APPOINTMENT.  [phone] AND  PLEASE NOTIFY NUCLEAR MEDICINE AT [facility] AT LEAST 24 HOURS IN ADVANCE IF YOU ARE UNABLE TO KEEP YOUR APPOINTMENT. [phone]  How to prepare for your Myoview test:  1. Do not eat or drink after midnight 2. No caffeine for 24 hours prior to test 3. No smoking 24 hours prior to test. 4. Your medication may be taken with water.  If your doctor stopped a medication because of this test, do not take that medication. 5. Ladies, please do not wear dresses.  Skirts or pants are appropriate. Please wear a short sleeve shirt. 6. No perfume, cologne or lotion. 7. Wear comfortable walking shoes. No heels!   Follow-Up: At [facility], you and your health needs are our priority.  As part of our continuing mission to provide you with exceptional heart care, we have created designated Provider Care Teams.  These Care Teams include your primary Cardiologist (physician) and Advanced Practice Providers (APPs -  Physician Assistants and Nurse Practitioners) who all work together to provide you with the care you need, when you need it.  . You will need a follow up appointment as needed   . Providers on your designated Care Team:   . Murray Hodgkins, NP .  Christell Faith, PA-C . Marrianne Mood, PA-C  Any Other Special Instructions Will Be Listed Below (If Applicable).  For educational health videos Log in to : [email or web address] Or : [email or web address], password : [email or web address]   Echocardiogram An echocardiogram is a procedure that uses painless sound waves (ultrasound) to produce an image of the heart. Images from an echocardiogram can provide  important information about:  Signs of coronary artery disease (CAD).  Aneurysm detection. An aneurysm is a weak or damaged part of an artery wall that bulges out from the normal force of blood pumping through the body.  Heart size and shape. Changes in the size or shape of the heart can be associated with certain conditions, including heart failure, aneurysm, and CAD.  Heart muscle function.  Heart valve function.  Signs of a past heart attack.  Fluid buildup around the heart.  Thickening of the heart muscle.  A tumor or infectious growth around the heart valves. Tell a health care provider about:  Any allergies you have.  All medicines you are taking, including vitamins, herbs, eye drops, creams, and over-the-counter medicines.  Any blood disorders you have.  Any surgeries you have had.  Any medical conditions you have.  Whether you are pregnant or may be pregnant. What are the risks? Generally, this is a safe procedure. However, problems may occur, including:  Allergic reaction to dye (contrast) that may be used during the procedure. What happens before the procedure? No specific preparation is needed. You may eat and drink normally. What happens during the procedure?   An IV tube may be inserted into one of your veins.  You may receive contrast through this tube. A contrast is an injection that improves the quality of the pictures from your heart.  A gel will be applied to your chest.  A wand-like tool (transducer) will be moved over your chest. The gel will help to transmit the sound waves from the transducer.  The sound waves will harmlessly bounce off of your heart to allow the heart images to be captured in real-time motion. The images will be recorded on a computer. The procedure may vary among health care providers and hospitals. What happens after the procedure?  You may return to your normal, everyday life, including diet, activities, and medicines,  unless your health care provider tells you not to do that. Summary  An echocardiogram is a procedure that uses painless sound waves (ultrasound) to produce an image of the heart.  Images from an echocardiogram can provide important information about the size and shape of your heart, heart muscle function, heart valve function, and fluid buildup around your heart.  You do not need to do anything to prepare before this procedure. You may eat and drink normally.  After the echocardiogram is completed, you may return to your normal, everyday life, unless your health care provider tells you not to do that. This information is not intended to replace advice given to you by your health care provider. Make sure you discuss any questions you have with your health care provider. Document Released: 04/08/2000 Document Revised: 05/14/2016 Document Reviewed: 05/14/2016 Elsevier Interactive Patient Education  2019 [employer].  Cardiac Nuclear Scan A cardiac nuclear scan is a test that is done to check the flow of blood to your heart. It is done when you are resting and when you are exercising. The test looks for problems such as:  Not enough blood reaching a portion of the heart.  The heart muscle not  working as it should. You may need this test if:  You have heart disease.  You have had lab results that are not normal.  You have had heart surgery or a balloon procedure to open up blocked arteries (angioplasty).  You have chest pain.  You have shortness of breath. In this test, a special dye (tracer) is put into your bloodstream. The tracer will travel to your heart. A camera will then take pictures of your heart to see how the tracer moves through your heart. This test is usually done at a hospital and takes 2-4 hours. Tell a doctor about:  Any allergies you have.  All medicines you are taking, including vitamins, herbs, eye drops, creams, and over-the-counter medicines.  Any problems you  or family members have had with anesthetic medicines.  Any blood disorders you have.  Any surgeries you have had.  Any medical conditions you have.  Whether you are pregnant or may be pregnant. What are the risks? Generally, this is a safe test. However, problems may occur, such as:  Serious chest pain and heart attack. This is only a risk if the stress portion of the test is done.  Rapid heartbeat.  A feeling of warmth in your chest. This feeling usually does not last long.  Allergic reaction to the tracer. What happens before the test?  Ask your doctor about changing or stopping your normal medicines. This is important.  Follow instructions from your doctor about what you cannot eat or drink.  Remove your jewelry on the day of the test. What happens during the test?  An IV tube will be inserted into one of your veins.  Your doctor will give you a small amount of tracer through the IV tube.  You will wait for 20-40 minutes while the tracer moves through your bloodstream.  Your heart will be monitored with an electrocardiogram (ECG).  You will lie down on an exam table.  Pictures of your heart will be taken for about 15-20 minutes.  You may also have a stress test. For this test, one of these things may be done: ? You will be asked to exercise on a treadmill or a stationary bike. ? You will be given medicines that will make your heart work harder. This is done if you are unable to exercise.  When blood flow to your heart has peaked, a tracer will again be given through the IV tube.  After 20-40 minutes, you will get back on the exam table. More pictures will be taken of your heart.  Depending on the tracer that is used, more pictures may need to be taken 3-4 hours later.  Your IV tube will be removed when the test is over. The test may vary among doctors and hospitals. What happens after the test?  Ask your doctor: ? Whether you can return to your normal  schedule, including diet, activities, and medicines. ? Whether you should drink more fluids. This will help to remove the tracer from your body. Drink enough fluid to keep your pee (urine) pale yellow.  Ask your doctor, or the department that is doing the test: ? When will my results be ready? ? How will I get my results? Summary  A cardiac nuclear scan is a test that is done to check the flow of blood to your heart.  Tell your doctor whether you are pregnant or may be pregnant.  Before the test, ask your doctor about changing or stopping your normal medicines.  This is important.  Ask your doctor whether you can return to your normal activities. You may be asked to drink more fluids. This information is not intended to replace advice given to you by your health care provider. Make sure you discuss any questions you have with your health care provider. Document Released: 09/25/2017 Document Revised: 09/25/2017 Document Reviewed: 09/25/2017 Elsevier Interactive Patient Education  2019 [employer].

## 2018-06-28 ENCOUNTER — Ambulatory Visit: Payer: Self-pay

## 2018-06-28 ENCOUNTER — Observation Stay
Admission: EM | Admit: 2018-06-28 | Discharge: 2018-07-01 | Disposition: A | Discharge status: Home / Self Care | Payer: Medicare Other | Attending: Internal Medicine | Admitting: Internal Medicine

## 2018-06-28 ENCOUNTER — Emergency Department: Payer: Medicare Other

## 2018-06-28 ENCOUNTER — Encounter: Payer: Self-pay | Admitting: Emergency Medicine

## 2018-06-28 ENCOUNTER — Other Ambulatory Visit: Payer: Self-pay

## 2018-06-28 DIAGNOSIS — F329 Major depressive disorder, single episode, unspecified: Secondary | ICD-10-CM | POA: Diagnosis not present

## 2018-06-28 DIAGNOSIS — E785 Hyperlipidemia, unspecified: Secondary | ICD-10-CM | POA: Diagnosis present

## 2018-06-28 DIAGNOSIS — Z791 Long term (current) use of non-steroidal anti-inflammatories (NSAID): Secondary | ICD-10-CM | POA: Insufficient documentation

## 2018-06-28 DIAGNOSIS — Z79899 Other long term (current) drug therapy: Secondary | ICD-10-CM | POA: Insufficient documentation

## 2018-06-28 DIAGNOSIS — R0602 Shortness of breath: Secondary | ICD-10-CM | POA: Diagnosis not present

## 2018-06-28 DIAGNOSIS — J209 Acute bronchitis, unspecified: Secondary | ICD-10-CM | POA: Diagnosis not present

## 2018-06-28 DIAGNOSIS — Z832 Family history of diseases of the blood and blood-forming organs and certain disorders involving the immune mechanism: Secondary | ICD-10-CM | POA: Diagnosis not present

## 2018-06-28 DIAGNOSIS — R739 Hyperglycemia, unspecified: Secondary | ICD-10-CM | POA: Diagnosis not present

## 2018-06-28 DIAGNOSIS — J45909 Unspecified asthma, uncomplicated: Secondary | ICD-10-CM | POA: Diagnosis present

## 2018-06-28 DIAGNOSIS — K219 Gastro-esophageal reflux disease without esophagitis: Secondary | ICD-10-CM | POA: Diagnosis not present

## 2018-06-28 DIAGNOSIS — Z79890 Hormone replacement therapy (postmenopausal): Secondary | ICD-10-CM | POA: Insufficient documentation

## 2018-06-28 DIAGNOSIS — E039 Hypothyroidism, unspecified: Secondary | ICD-10-CM | POA: Diagnosis not present

## 2018-06-28 DIAGNOSIS — J45901 Unspecified asthma with (acute) exacerbation: Secondary | ICD-10-CM | POA: Diagnosis present

## 2018-06-28 DIAGNOSIS — J4551 Severe persistent asthma with (acute) exacerbation: Principal | ICD-10-CM | POA: Insufficient documentation

## 2018-06-28 DIAGNOSIS — R05 Cough: Secondary | ICD-10-CM | POA: Diagnosis not present

## 2018-06-28 DIAGNOSIS — R0789 Other chest pain: Secondary | ICD-10-CM | POA: Diagnosis not present

## 2018-06-28 LAB — CBC
HCT: 36.7 % (ref 36.0–46.0)
Hemoglobin: 12.3 g/dL (ref 12.0–15.0)
MCH: 32.1 pg (ref 26.0–34.0)
MCHC: 33.5 g/dL (ref 30.0–36.0)
MCV: 95.8 fL (ref 80.0–100.0)
NRBC: 0 % (ref 0.0–0.2)
Platelets: 166 10*3/uL (ref 150–400)
RBC: 3.83 MIL/uL — AB (ref 3.87–5.11)
RDW: 13.3 % (ref 11.5–15.5)
WBC: 4.8 10*3/uL (ref 4.0–10.5)

## 2018-06-28 LAB — TROPONIN I: Troponin I: <0.03 ng/mL (ref <0.03)

## 2018-06-28 LAB — BASIC METABOLIC PANEL
Anion gap: 8 (ref 5–15)
BUN: 9 mg/dL (ref 8–23)
CO2: 25 mmol/L (ref 22–32)
Calcium: 9 mg/dL (ref 8.9–10.3)
Chloride: 106 mmol/L (ref 98–111)
Creatinine, Ser: 0.71 mg/dL (ref 0.44–1.00)
GFR calc non Af Amer: >60 mL/min (ref >60)
GLUCOSE: 114 mg/dL — AB (ref 70–99)
Potassium: 3.7 mmol/L (ref 3.5–5.1)
Sodium: 139 mmol/L (ref 135–145)

## 2018-06-28 MED ORDER — IPRATROPIUM-ALBUTEROL 0.5-2.5 (3) MG/3ML IN SOLN
3.0000 mL | RESPIRATORY_TRACT | Status: DC | PRN
  Administered 2018-06-29 – 2018-07-01 (×3): 3 mL via RESPIRATORY_TRACT
  Filled 2018-06-28 (×3): qty 3

## 2018-06-28 MED ORDER — ACETAMINOPHEN 325 MG PO TABS
650.0000 mg | ORAL_TABLET | Freq: Four times a day (QID) | ORAL | Status: DC | PRN
  Administered 2018-06-29 – 2018-07-01 (×3): 650 mg via ORAL
  Filled 2018-06-28 (×3): qty 2

## 2018-06-28 MED ORDER — BENZONATATE 100 MG PO CAPS
200.0000 mg | ORAL_CAPSULE | Freq: Three times a day (TID) | ORAL | Status: DC | PRN
  Administered 2018-06-28 – 2018-06-30 (×4): 200 mg via ORAL
  Filled 2018-06-28 (×4): qty 2

## 2018-06-28 MED ORDER — METHYLPREDNISOLONE SODIUM SUCC 125 MG IJ SOLR
60.0000 mg | Freq: Four times a day (QID) | INTRAMUSCULAR | Status: DC
  Administered 2018-06-28 – 2018-07-01 (×10): 60 mg via INTRAVENOUS
  Filled 2018-06-28 (×10): qty 2

## 2018-06-28 MED ORDER — CITALOPRAM HYDROBROMIDE 20 MG PO TABS
20.0000 mg | ORAL_TABLET | Freq: Every day | ORAL | Status: DC
  Administered 2018-06-29 – 2018-07-01 (×3): 20 mg via ORAL
  Filled 2018-06-28 (×3): qty 1

## 2018-06-28 MED ORDER — IPRATROPIUM-ALBUTEROL 0.5-2.5 (3) MG/3ML IN SOLN
3.0000 mL | Freq: Once | RESPIRATORY_TRACT | Status: AC
  Administered 2018-06-28: 3 mL via RESPIRATORY_TRACT
  Filled 2018-06-28: qty 3

## 2018-06-28 MED ORDER — PANTOPRAZOLE SODIUM 40 MG PO TBEC
40.0000 mg | DELAYED_RELEASE_TABLET | Freq: Every day | ORAL | Status: DC
  Administered 2018-06-29 – 2018-07-01 (×3): 40 mg via ORAL
  Filled 2018-06-28 (×3): qty 1

## 2018-06-28 MED ORDER — ACETAMINOPHEN 650 MG RE SUPP: 650.0000 mg | Freq: Four times a day (QID) | RECTAL | Status: DC | PRN

## 2018-06-28 MED ORDER — LEVOTHYROXINE SODIUM 50 MCG PO TABS
50.0000 ug | ORAL_TABLET | Freq: Every day | ORAL | Status: DC
  Administered 2018-06-29 – 2018-07-01 (×3): 50 ug via ORAL
  Filled 2018-06-28 (×3): qty 1

## 2018-06-28 MED ORDER — FAMOTIDINE 20 MG PO TABS
20.0000 mg | ORAL_TABLET | Freq: Every evening | ORAL | Status: DC
  Administered 2018-06-29 – 2018-06-30 (×2): 20 mg via ORAL
  Filled 2018-06-28 (×2): qty 1

## 2018-06-28 MED ORDER — ONDANSETRON HCL 4 MG/2ML IJ SOLN: 4.0000 mg | Freq: Four times a day (QID) | INTRAMUSCULAR | Status: DC | PRN

## 2018-06-28 MED ORDER — ONDANSETRON HCL 4 MG PO TABS: 4.0000 mg | ORAL_TABLET | Freq: Four times a day (QID) | ORAL | Status: DC | PRN

## 2018-06-28 MED ORDER — SODIUM CHLORIDE 0.9% FLUSH: 3.0000 mL | Freq: Once | INTRAVENOUS | Status: DC

## 2018-06-28 MED ORDER — METHYLPREDNISOLONE SODIUM SUCC 125 MG IJ SOLR
125.0000 mg | Freq: Once | INTRAMUSCULAR | Status: AC
  Administered 2018-06-28: 125 mg via INTRAVENOUS
  Filled 2018-06-28: qty 2

## 2018-06-28 MED ORDER — ENOXAPARIN SODIUM 40 MG/0.4ML ~~LOC~~ SOLN
40.0000 mg | SUBCUTANEOUS | Status: DC
  Administered 2018-06-29 – 2018-06-30 (×2): 40 mg via SUBCUTANEOUS
  Filled 2018-06-28 (×2): qty 0.4

## 2018-06-28 MED ORDER — GUAIFENESIN-DM 100-10 MG/5ML PO SYRP
5.0000 mL | ORAL_SOLUTION | ORAL | Status: DC | PRN
  Administered 2018-06-29 – 2018-07-01 (×7): 5 mL via ORAL
  Filled 2018-06-28 (×8): qty 5

## 2018-06-28 NOTE — ED Triage Notes (Signed)
Pt c/o increased SOB and cough today, hx of asthma. PT also states tightness in her chest. PT able to speak in full sentences

## 2018-06-28 NOTE — Telephone Encounter (Signed)
Pt c/o moderate SOB and wheezing. Could hear pt wheezing over the phone. Pt speaking in phrases. Pt c/o subjective fever, dry cough, dizziness, chest is "tight" and pt stated it was hard to breathe especially with exertion. Pt stated symptoms started Tuesday.  Care advice given and pt verbalized understanding. Pt advised to be driven to the ED. Pt stated she will be going to [facility]  Reason for Disposition . [1] MODERATE difficulty breathing (e.g., speaks in phrases, SOB even at rest, pulse 100-120) AND [2] NEW-onset or WORSE than normal  Answer Assessment - Initial Assessment Questions 1. RESPIRATORY STATUS: "Describe your breathing?" (e.g., wheezing, shortness of breath, unable to speak, severe coughing)      Wheezing, SOB,  2. ONSET: "When did this breathing problem begin?"      Tuesday  3. PATTERN "Does the difficult breathing come and go, or has it been constant since it started?"      Comes and goes  4. SEVERITY: "How bad is your breathing?" (e.g., mild, moderate, severe)    - MILD: No SOB at rest, mild SOB with walking, speaks normally in sentences, can lay down, no retractions, pulse < 100.    - MODERATE: SOB at rest, SOB with minimal exertion and prefers to sit, cannot lie down flat, speaks in phrases, mild retractions, audible wheezing, pulse 100-120.    - SEVERE: Very SOB at rest, speaks in single words, struggling to breathe, sitting hunched forward, retractions, pulse > 120      moderate 5. RECURRENT SYMPTOM: "Have you had difficulty breathing before?" If so, ask: "When was the last time?" and "What happened that time?"      Yes- when exertion saw heart doctor yesterday 6. CARDIAC HISTORY: "Do you have any history of heart disease?" (e.g., heart attack, angina, bypass surgery, angioplasty)      no 7. LUNG HISTORY: "Do you have any history of lung disease?"  (e.g., pulmonary embolus, asthma, emphysema)     asthma 8. CAUSE: "What do you think is causing the breathing problem?"   Cold  9. OTHER SYMPTOMS: "Do you have any other symptoms? (e.g., dizziness, runny nose, cough, chest pain, fever)     Subjective fever, cough (dry), dizziness, chest is tight hard to breathe, cold symptoms, arms and head aching 10. PREGNANCY: "Is there any chance you are pregnant?" "When was your last menstrual period?"       n/a 11. TRAVEL: "Have you traveled out of the country in the last month?" (e.g., travel history, exposures)      no  Protocols used: BREATHING DIFFICULTY-A-AH

## 2018-06-28 NOTE — ED Notes (Signed)
ED TO INPATIENT HANDOFF REPORT  ED Nurse Name and Phone #: Caryl Pina, [place]  S Name/Age/Gender Sarah [name] 68 y.o. female Room/Bed: ED12A/ED12A  Code Status   Code Status: Not on file  Home/SNF/Other Home Patient oriented to: self, place, time and situation Is this baseline? Yes   Triage Complete: Triage complete  Chief Complaint sob  Triage Note Pt c/o increased SOB and cough today, hx of asthma. PT also states tightness in her chest. PT able to speak in full sentences   Allergies No Known Allergies  Level of Care/Admitting Diagnosis ED Disposition    ED Disposition Condition [facility]: [place] [100120]  Level of Care: Med-Surg [16]  Diagnosis: Asthma exacerbation [937902]  Admitting Physician: Lance Coon [[id number]]  Attending Physician: Lance Coon [phone]  Estimated length of stay: past midnight tomorrow  Certification:: I certify this patient will need inpatient services for at least 2 midnights  PT Class (Do Not Modify): Inpatient [101]  PT Acc Code (Do Not Modify): Private [1]       B Medical/Surgery History Past Medical History:  Diagnosis Date  . Arthritis   . Asthma   . Depression   . Dyspnea    with heat  . Environmental and seasonal allergies   . GERD (gastroesophageal reflux disease)   . Wheezing    Past Surgical History:  Procedure Laterality Date  . ABDOMINAL HYSTERECTOMY  1989  . CATARACT EXTRACTION W/PHACO Right 09/13/2016   Procedure: CATARACT EXTRACTION PHACO AND INTRAOCULAR LENS PLACEMENT (IOC);  Surgeon: Birder Robson, MD;  Location: [facility] ORS;  Service: Ophthalmology;  Laterality: Right;  [place] 00:38 AP% 14.3 CDE 5.51 Fluid pack lot # [id number]  . CATARACT EXTRACTION W/PHACO Left 10/11/2016   Procedure: CATARACT EXTRACTION PHACO AND INTRAOCULAR LENS PLACEMENT (IOC);  Surgeon: Birder Robson, MD;  Location: [facility] ORS;  Service: Ophthalmology;  Laterality: Left;  [place] 00:30 AP%  11.3 CDE 3.50 fluid pack lot # [id number]  . COLONOSCOPY WITH PROPOFOL N/A 09/30/2014   Procedure: COLONOSCOPY WITH PROPOFOL;  Surgeon: Robert Bellow, MD;  Location: [facility] ENDOSCOPY;  Service: Endoscopy;  Laterality: N/A;  . ESOPHAGOGASTRODUODENOSCOPY N/A 09/30/2014   Procedure: ESOPHAGOGASTRODUODENOSCOPY (EGD);  Surgeon: Robert Bellow, MD;  Location: [facility] ENDOSCOPY;  Service: Endoscopy;  Laterality: N/A;  . FOOT SURGERY Left 1985  . KNEE SURGERY  2016     A IV Location/Drains/Wounds Patient Lines/Drains/Airways Status   Active Line/Drains/Airways    Name:   Placement date:   Placement time:   Site:   Days:   Peripheral IV 06/28/18 Left Antecubital   06/28/18    1707    Antecubital   less than 1   Airway 6 mm   -    -        Incision (Closed) 09/13/16 Eye Right   09/13/16    1133     653   Incision (Closed) 10/11/16 Eye Left   10/11/16    0908     625          Intake/Output Last 24 hours No intake or output data in the 24 hours ending 06/28/18 2218  Labs/Imaging Results for orders placed or performed during the hospital encounter of 06/28/18 (from the past 48 hour(s))  Basic metabolic panel     Status: Abnormal   Collection Time: 06/28/18  1:58 PM  Result Value Ref Range   Sodium 139 135 - 145 mmol/L   Potassium 3.7 3.5 - 5.1  mmol/L   Chloride 106 98 - 111 mmol/L   CO2 25 22 - 32 mmol/L   Glucose, Bld 114 (H) 70 - 99 mg/dL   BUN 9 8 - 23 mg/dL   Creatinine, Ser 0.71 0.44 - 1.00 mg/dL   Calcium 9.0 8.9 - 10.3 mg/dL   GFR calc non Af Amer >60 >60 mL/min   GFR calc Af Amer >60 >60 mL/min   Anion gap 8 5 - 15    Comment: Performed at [facility], [address] [place], [place] [address]  CBC     Status: Abnormal   Collection Time: 06/28/18  1:58 PM  Result Value Ref Range   WBC 4.8 4.0 - 10.5 K/uL   RBC 3.83 (L) 3.87 - 5.11 MIL/uL   Hemoglobin 12.3 12.0 - 15.0 g/dL   HCT 36.7 36.0 - 46.0 %   MCV 95.8 80.0 - 100.0 fL   MCH 32.1 26.0 - 34.0 pg   MCHC 33.5  30.0 - 36.0 g/dL   RDW 13.3 11.5 - 15.5 %   Platelets 166 150 - 400 K/uL   nRBC 0.0 0.0 - 0.2 %    Comment: Performed at [facility], [address] [place], [place] [address]  Troponin I - ONCE - STAT     Status: None   Collection Time: 06/28/18  1:58 PM  Result Value Ref Range   Troponin I <0.03 <0.03 ng/mL    Comment: Performed at [facility], [address], [place], [place] [address]   Dg Chest 2 View  Result Date: 06/28/2018 CLINICAL DATA:  Shortness of breath for 2 days, worsening today. Generalized chest tightness and cough. History of asthma. Tightness in chest. Nonsmoker. EXAM: CHEST - 2 VIEW COMPARISON:  None. FINDINGS: Borderline cardiomegaly. Lungs are clear. No pleural effusion or pneumothorax seen. No acute or suspicious osseous finding. IMPRESSION: No active cardiopulmonary disease. No evidence of pneumonia or pulmonary edema. Electronically Signed   By: Franki Cabot M.D.   On: 06/28/2018 14:36    Pending Labs [employer] (From admission, onward)    Start     Ordered   Signed and Held  HIV antibody (Routine Testing)  Once,   R     Signed and Held   Signed and Held  CBC  (enoxaparin (LOVENOX)    CrCl >/= 30 ml/min)  Once,   R    Comments:  Baseline for enoxaparin therapy IF NOT ALREADY DRAWN.  Notify MD if PLT < 100 K.    Signed and Held   Signed and Held  Creatinine, serum  (enoxaparin (LOVENOX)    CrCl >/= 30 ml/min)  Once,   R    Comments:  Baseline for enoxaparin therapy IF NOT ALREADY DRAWN.    Signed and Held   Signed and Held  Creatinine, serum  (enoxaparin (LOVENOX)    CrCl >/= 30 ml/min)  Weekly,   R    Comments:  while on enoxaparin therapy    Signed and Held   Signed and Held  Basic metabolic panel  Tomorrow morning,   R     Signed and Held   Signed and Held  CBC  Tomorrow morning,   R     Signed and Held          Vitals/Pain Today's Vitals   06/28/18 1830 06/28/18 1900 06/28/18 2000 06/28/18 2100  BP: 138/68 135/65 (!)  153/80 (!) 129/55  Pulse: (!) 101 (!) 101 98 98  Resp: 19 19 16 20   Temp:      TempSrc:      SpO2: 92% 92% 92% 91%  Weight:      Height:      PainSc:   0-No pain     Isolation Precautions No active isolations  Medications Medications  sodium chloride flush (NS) 0.9 % injection 3 mL (has no administration in time range)  ipratropium-albuterol (DUONEB) 0.5-2.5 (3) MG/3ML nebulizer solution 3 mL (3 mLs Nebulization Given 06/28/18 1657)  ipratropium-albuterol (DUONEB) 0.5-2.5 (3) MG/3ML nebulizer solution 3 mL (3 mLs Nebulization Given 06/28/18 1657)  ipratropium-albuterol (DUONEB) 0.5-2.5 (3) MG/3ML nebulizer solution 3 mL (3 mLs Nebulization Given 06/28/18 1656)  methylPREDNISolone sodium succinate (SOLU-MEDROL) 125 mg/2 mL injection 125 mg (125 mg Intravenous Given 06/28/18 1701)    Mobility walks Low fall risk   Focused Assessments Pulmonary Assessment Handoff:  Lung sounds: Bilateral Breath Sounds: Expiratory wheezes L Breath Sounds: Expiratory wheezes R Breath Sounds: Expiratory wheezes O2 Device: Room Air        R Recommendations: See Admitting Provider Note  Report given to: Sharyn Lull, RN  Additional Notes: N/A

## 2018-06-28 NOTE — ED Provider Notes (Signed)
[facility] Emergency Department Provider Note   ____________________________________________    I have reviewed the triage vital signs and the nursing notes.   HISTORY  Chief Complaint Shortness of Breath     HPI Sarah [name] is a 68 y.o. female who presents with complaints of shortness of breath.  Patient describes a history of asthma and reports over the last 3 days she has had worsening chest tightness, worsening shortness of breath.  She does not smoke.  Denies fevers or chills.  No recent travel.  No calf pain or swelling.  Has not taken anything for this.  Past Medical History:  Diagnosis Date  . Arthritis   . Asthma   . Depression   . Dyspnea    with heat  . Environmental and seasonal allergies   . GERD (gastroesophageal reflux disease)   . Wheezing     Patient Active Problem List   Diagnosis Date Noted  . Chest tightness 06/26/2018  . SOB (shortness of breath) 04/08/2018  . Weight loss, unintentional 10/08/2017  . Hypothyroid 06/15/2017  . Post herpetic neuralgia 06/06/2017  . Encounter for preventive health examination 09/09/2014  . Esophageal reflux 09/09/2014  . Hyperlipidemia 08/17/2014  . S/P hysterectomy 04/28/2013  . Initial Medicare annual wellness visit 04/28/2013  . Asthma   . Depression     Past Surgical History:  Procedure Laterality Date  . ABDOMINAL HYSTERECTOMY  1989  . CATARACT EXTRACTION W/PHACO Right 09/13/2016   Procedure: CATARACT EXTRACTION PHACO AND INTRAOCULAR LENS PLACEMENT (IOC);  Surgeon: Birder Robson, MD;  Location: [facility] ORS;  Service: Ophthalmology;  Laterality: Right;  [place] 00:38 AP% 14.3 CDE 5.51 Fluid pack lot # [id number]  . CATARACT EXTRACTION W/PHACO Left 10/11/2016   Procedure: CATARACT EXTRACTION PHACO AND INTRAOCULAR LENS PLACEMENT (IOC);  Surgeon: Birder Robson, MD;  Location: [facility] ORS;  Service: Ophthalmology;  Laterality: Left;  [place] 00:30 AP% 11.3 CDE 3.50 fluid pack lot #  [id number]  . COLONOSCOPY WITH PROPOFOL N/A 09/30/2014   Procedure: COLONOSCOPY WITH PROPOFOL;  Surgeon: Gracelyn Coventry Bellow, MD;  Location: [facility] ENDOSCOPY;  Service: Endoscopy;  Laterality: N/A;  . ESOPHAGOGASTRODUODENOSCOPY N/A 09/30/2014   Procedure: ESOPHAGOGASTRODUODENOSCOPY (EGD);  Surgeon: Margrett Kalb Bellow, MD;  Location: [facility] ENDOSCOPY;  Service: Endoscopy;  Laterality: N/A;  . FOOT SURGERY Left 1985  . KNEE SURGERY  2016    Prior to Admission medications   Medication Sig Start Date End Date Taking? Authorizing Provider  albuterol (PROVENTIL HFA;VENTOLIN HFA) 108 (90 Base) MCG/ACT inhaler Inhale 2 puffs into the lungs every 6 (six) hours as needed for wheezing or shortness of breath. 04/22/16   Crecencio Mc, MD  Calcium Carb-Cholecalciferol (CALCIUM 600 + D PO) Take 2 tablets by mouth daily.    [provider]  citalopram (CELEXA) 20 MG tablet Take 1 tablet (20 mg total) by mouth daily. 01/26/18   Crecencio Mc, MD  famotidine (PEPCID) 20 MG tablet TAKE 1 TABLET BY MOUTH DAILY. BEFORE DINNER 07/28/17   Crecencio Mc, MD  fexofenadine (ALLEGRA) 180 MG tablet Take 180 mg by mouth daily.    [provider]  ibuprofen (ADVIL,MOTRIN) 200 MG tablet Take 600 mg by mouth daily as needed for moderate pain.     [provider]  levothyroxine (SYNTHROID, LEVOTHROID) 50 MCG tablet TAKE 1 TABLET BY MOUTH DAILY 04/09/18   Crecencio Mc, MD  Omega-3 Fatty Acids (FISH OIL) 1200 MG CAPS Take 1,200 mg by mouth daily.  [provider]  pantoprazole (PROTONIX) 40 MG tablet TAKE 1 TABLET BY MOUTH DAILY. IN AM 30 MINUTES BEFORE EATING 07/28/17   Crecencio Mc, MD     Allergies Patient has no known allergies.  Family History  Problem Relation Age of Onset  . Diabetes Mother   . Hyperlipidemia Mother   . Heart disease Mother     Social History Social History   Tobacco Use  . Smoking status: Never Smoker  . Smokeless tobacco: Never Used  Substance Use Topics   . Alcohol use: No  . Drug use: No    Review of Systems  Constitutional: No fever/chills Eyes: No visual changes.  ENT: No sore throat. Cardiovascular: Chest tightness Respiratory: As above Gastrointestinal: No abdominal pain.   Genitourinary: Negative for dysuria. Musculoskeletal: Negative for back pain. Skin: Negative for rash. Neurological: Negative for headaches   ____________________________________________   PHYSICAL EXAM:  VITAL SIGNS: ED Triage Vitals  Enc Vitals Group     BP 06/28/18 1357 132/73     Pulse Rate 06/28/18 1355 87     Resp 06/28/18 1355 20     Temp 06/28/18 1355 97.9 F (36.6 C)     Temp Source 06/28/18 1355 Oral     SpO2 06/28/18 1355 94 %     Weight 06/28/18 1352 83.9 kg (185 lb)     Height 06/28/18 1352 1.626 m (5\' 4" )     Head Circumference --      Peak Flow --      Pain Score 06/28/18 1354 5     Pain Loc --      Pain Edu? --      Excl. in [place]? --     Constitutional: Alert and oriented.  Eyes: Conjunctivae are normal.   Nose: No congestion/rhinnorhea. Mouth/Throat: Mucous membranes are moist.    Cardiovascular: Normal rate, regular rhythm. Grossly normal heart sounds.  Good peripheral circulation. Respiratory: Increased respiratory effort with tachypnea, diffuse wheezing Gastrointestinal: Soft and nontender. No distention.  Musculoskeletal: No lower extremity tenderness nor edema.  Warm and well perfused Neurologic:  Normal speech and language. No gross focal neurologic deficits are appreciated.  Skin:  Skin is warm, dry and intact. No rash noted. Psychiatric: Mood and affect are normal. Speech and behavior are normal.  ____________________________________________   LABS (all labs ordered are listed, but only abnormal results are displayed)  Labs Reviewed  BASIC METABOLIC PANEL - Abnormal; Notable for the following components:      Result Value   Glucose, Bld 114 (*)    All other components within normal limits  CBC -  Abnormal; Notable for the following components:   RBC 3.83 (*)    All other components within normal limits  TROPONIN I   ____________________________________________  EKG  ED ECG REPORT I, Lavonia Drafts, the attending physician, personally viewed and interpreted this ECG.  Date: 06/28/2018  Rhythm: normal sinus rhythm QRS Axis: Right axis deviation Intervals: normal ST/T Wave abnormalities: normal Narrative Interpretation: no evidence of acute ischemia  ____________________________________________  RADIOLOGY  Chest x-ray negative for pneumonia ____________________________________________   PROCEDURES  Procedure(s) performed: No  Procedures   Critical Care performed: No ____________________________________________   INITIAL IMPRESSION / ASSESSMENT AND PLAN / ED COURSE  Pertinent labs & imaging results that were available during my care of the patient were reviewed by me and considered in my medical decision making (see chart for details).  Patient presents with shortness of breath, audible wheezing, treated with IV Solu-Medrol,  multiple duo nebs with little improvement in fact now she is becoming hypoxic with speaking or ambulating to the bathroom.  She will require admission for further treatment    ____________________________________________   FINAL CLINICAL IMPRESSION(S) / ED DIAGNOSES  Final diagnoses:  Severe persistent asthma with exacerbation        Note:  This document was prepared using Dragon voice recognition software and may include unintentional dictation errors.   Lavonia Drafts, MD 06/28/18 [phone]

## 2018-06-28 NOTE — ED Notes (Signed)
Hospitalist to bedside.

## 2018-06-28 NOTE — ED Notes (Signed)
Pt ambulatory to toilet with standyby assist.

## 2018-06-28 NOTE — H&P (Signed)
[facility] [place] NAME: Sarah [name]    MR#:  [id number]  DATE OF BIRTH:  Apr 17, 1951  DATE OF ADMISSION:  06/28/2018  PRIMARY CARE PHYSICIAN: Crecencio Mc, MD   REQUESTING/REFERRING PHYSICIAN: Corky Downs, MD  CHIEF COMPLAINT:   Chief Complaint  Patient presents with  . Shortness of Breath    HISTORY OF PRESENT ILLNESS:  Sarah [name]  is a 68 y.o. female who presents with chief complaint as above.  Patient presents the ED with acute worsening of shortness of breath.  She states that her asthma has been bothering her significantly over the past couple of months, but that it got acutely worse over the last 2 days.  She has had significant wheezing and increased cough.  Here in the ED she received duo nebs and IV Solu-Medrol with some improvement.  However, patient still has wheezing.  Hospitalist were called for admission  PAST MEDICAL HISTORY:   Past Medical History:  Diagnosis Date  . Arthritis   . Asthma   . Depression   . Dyspnea    with heat  . Environmental and seasonal allergies   . GERD (gastroesophageal reflux disease)   . Wheezing      PAST SURGICAL HISTORY:   Past Surgical History:  Procedure Laterality Date  . ABDOMINAL HYSTERECTOMY  1989  . CATARACT EXTRACTION W/PHACO Right 09/13/2016   Procedure: CATARACT EXTRACTION PHACO AND INTRAOCULAR LENS PLACEMENT (IOC);  Surgeon: Birder Robson, MD;  Location: [facility] ORS;  Service: Ophthalmology;  Laterality: Right;  [place] 00:38 AP% 14.3 CDE 5.51 Fluid pack lot # [id number]  . CATARACT EXTRACTION W/PHACO Left 10/11/2016   Procedure: CATARACT EXTRACTION PHACO AND INTRAOCULAR LENS PLACEMENT (IOC);  Surgeon: Birder Robson, MD;  Location: [facility] ORS;  Service: Ophthalmology;  Laterality: Left;  [place] 00:30 AP% 11.3 CDE 3.50 fluid pack lot # [id number]  . COLONOSCOPY WITH PROPOFOL N/A 09/30/2014   Procedure: COLONOSCOPY WITH PROPOFOL;  Surgeon: Robert Bellow, MD;  Location:  [facility] ENDOSCOPY;  Service: Endoscopy;  Laterality: N/A;  . ESOPHAGOGASTRODUODENOSCOPY N/A 09/30/2014   Procedure: ESOPHAGOGASTRODUODENOSCOPY (EGD);  Surgeon: Robert Bellow, MD;  Location: [facility] ENDOSCOPY;  Service: Endoscopy;  Laterality: N/A;  . FOOT SURGERY Left 1985  . KNEE SURGERY  2016     SOCIAL HISTORY:   Social History   Tobacco Use  . Smoking status: Never Smoker  . Smokeless tobacco: Never Used  Substance Use Topics  . Alcohol use: No     FAMILY HISTORY:   Family History  Problem Relation Age of Onset  . Diabetes Mother   . Hyperlipidemia Mother   . Heart disease Mother      DRUG ALLERGIES:  No Known Allergies  MEDICATIONS AT HOME:   Prior to Admission medications   Medication Sig Start Date End Date Taking? Authorizing Provider  albuterol (PROVENTIL HFA;VENTOLIN HFA) 108 (90 Base) MCG/ACT inhaler Inhale 2 puffs into the lungs every 6 (six) hours as needed for wheezing or shortness of breath. 04/22/16  Yes Crecencio Mc, MD  Calcium Carb-Cholecalciferol (CALCIUM 600 + D PO) Take 2 tablets by mouth daily.   Yes [provider]  citalopram (CELEXA) 20 MG tablet Take 1 tablet (20 mg total) by mouth daily. 01/26/18  Yes Crecencio Mc, MD  famotidine (PEPCID) 20 MG tablet TAKE 1 TABLET BY MOUTH DAILY. BEFORE DINNER Patient taking differently: Take 20 mg by mouth every evening.  07/28/17  Yes Crecencio Mc, MD  fexofenadine (ALLEGRA) 180 MG tablet Take 180 mg by mouth daily.   Yes [provider]  ibuprofen (ADVIL,MOTRIN) 200 MG tablet Take 600 mg by mouth daily as needed for moderate pain.    Yes [provider]  levothyroxine (SYNTHROID, LEVOTHROID) 50 MCG tablet TAKE 1 TABLET BY MOUTH DAILY Patient taking differently: Take 50 mcg by mouth daily.  04/09/18  Yes Crecencio Mc, MD  Omega-3 Fatty Acids (FISH OIL) 1200 MG CAPS Take 1,200 mg by mouth daily.    Yes [provider]  pantoprazole (PROTONIX) 40 MG tablet TAKE 1  TABLET BY MOUTH DAILY. IN AM 30 MINUTES BEFORE EATING Patient taking differently: Take 40 mg by mouth daily.  07/28/17  Yes Crecencio Mc, MD    REVIEW OF SYSTEMS:  Review of Systems  Constitutional: Negative for chills, fever, malaise/fatigue and weight loss.  HENT: Negative for ear pain, hearing loss and tinnitus.   Eyes: Negative for blurred vision, double vision, pain and redness.  Respiratory: Positive for cough, sputum production and wheezing. Negative for hemoptysis and shortness of breath.   Cardiovascular: Negative for chest pain, palpitations, orthopnea and leg swelling.  Gastrointestinal: Negative for abdominal pain, constipation, diarrhea, nausea and vomiting.  Genitourinary: Negative for dysuria, frequency and hematuria.  Musculoskeletal: Negative for back pain, joint pain and neck pain.  Skin:       No acne, rash, or lesions  Neurological: Negative for dizziness, tremors, focal weakness and weakness.  Endo/Heme/Allergies: Negative for polydipsia. Does not bruise/bleed easily.  Psychiatric/Behavioral: Negative for depression. The patient is not nervous/anxious and does not have insomnia.      VITAL SIGNS:   Vitals:   06/28/18 1800 06/28/18 1830 06/28/18 1900 06/28/18 2000  BP: (!) 147/61 138/68 135/65 (!) 153/80  Pulse: (!) 108 (!) 101 (!) 101 98  Resp: (!) 24 19 19 16   Temp:      TempSrc:      SpO2: 92% 92% 92% 92%  Weight:      Height:       Wt Readings from Last 3 Encounters:  06/28/18 83.9 kg  06/27/18 84.1 kg  04/09/18 84.3 kg    PHYSICAL EXAMINATION:  Physical Exam  Vitals reviewed. Constitutional: She is oriented to person, place, and time. She appears well-developed and well-nourished. No distress.  HENT:  Head: Normocephalic and atraumatic.  Mouth/Throat: Oropharynx is clear and moist.  Eyes: Pupils are equal, round, and reactive to light. Conjunctivae and EOM are normal. No scleral icterus.  Neck: Normal range of motion. Neck supple. No JVD  present. No thyromegaly present.  Cardiovascular: Normal rate, regular rhythm and intact distal pulses. Exam reveals no gallop and no friction rub.  No murmur heard. Respiratory: Effort normal. No respiratory distress. She has wheezes. She has no rales.  GI: Soft. Bowel sounds are normal. She exhibits no distension. There is no abdominal tenderness.  Musculoskeletal: Normal range of motion.        General: No edema.     Comments: No arthritis, no gout  Lymphadenopathy:    She has no cervical adenopathy.  Neurological: She is alert and oriented to person, place, and time. No cranial nerve deficit.  No dysarthria, no aphasia  Skin: Skin is warm and dry. No rash noted. No erythema.  Psychiatric: She has a normal mood and affect. Her behavior is normal. Judgment and thought content normal.    LABORATORY PANEL:   CBC Recent Labs  Lab 06/28/18 1358  WBC 4.8  HGB  12.3  HCT 36.7  PLT 166   ------------------------------------------------------------------------------------------------------------------  Chemistries  Recent Labs  Lab 06/28/18 1358  NA 139  K 3.7  CL 106  CO2 25  GLUCOSE 114*  BUN 9  CREATININE 0.71  CALCIUM 9.0   ------------------------------------------------------------------------------------------------------------------  Cardiac Enzymes Recent Labs  Lab 06/28/18 1358  TROPONINI <0.03   ------------------------------------------------------------------------------------------------------------------  RADIOLOGY:  Dg Chest 2 View  Result Date: 06/28/2018 CLINICAL DATA:  Shortness of breath for 2 days, worsening today. Generalized chest tightness and cough. History of asthma. Tightness in chest. Nonsmoker. EXAM: CHEST - 2 VIEW COMPARISON:  None. FINDINGS: Borderline cardiomegaly. Lungs are clear. No pleural effusion or pneumothorax seen. No acute or suspicious osseous finding. IMPRESSION: No active cardiopulmonary disease. No evidence of pneumonia or  pulmonary edema. Electronically Signed   By: Franki Cabot M.D.   On: 06/28/2018 14:36    EKG:   Orders placed or performed during the hospital encounter of 06/28/18  . ED EKG  . ED EKG    IMPRESSION AND PLAN:  Principal Problem:   Asthma exacerbation -IV Solu-Medrol, duo nebs, PRN antitussive, continue home meds Active Problems:   Hyperlipidemia -home dose antilipid   Esophageal reflux -home dose PPI   Hypothyroid -home dose thyroid replacement  Chart review performed and case discussed with ED provider. Labs, imaging and/or ECG reviewed by provider and discussed with patient/family. Management plans discussed with the patient and/or family.  DVT PROPHYLAXIS: SubQ lovenox   GI PROPHYLAXIS:  PPI   ADMISSION STATUS: Inpatient     CODE STATUS: Full  TOTAL TIME TAKING CARE OF THIS PATIENT: 45 minutes.   Ethlyn Daniels 06/28/2018, 9:29 PM  [facility]  Office  [phone]  CC: Primary care physician; Crecencio Mc, MD  Note:  This document was prepared using Dragon voice recognition software and may include unintentional dictation errors.

## 2018-06-28 NOTE — Telephone Encounter (Signed)
FYI:  Pt going to ED @ [facility].

## 2018-06-29 LAB — CBC
HCT: 35.8 % — ABNORMAL LOW (ref 36.0–46.0)
Hemoglobin: 11.6 g/dL — ABNORMAL LOW (ref 12.0–15.0)
MCH: 31.9 pg (ref 26.0–34.0)
MCHC: 32.4 g/dL (ref 30.0–36.0)
MCV: 98.4 fL (ref 80.0–100.0)
Platelets: 167 10*3/uL (ref 150–400)
RBC: 3.64 MIL/uL — ABNORMAL LOW (ref 3.87–5.11)
RDW: 13.3 % (ref 11.5–15.5)
WBC: 4.3 10*3/uL (ref 4.0–10.5)
nRBC: 0 % (ref 0.0–0.2)

## 2018-06-29 LAB — GLUCOSE, CAPILLARY
Glucose-Capillary: 167 mg/dL — ABNORMAL HIGH (ref 70–99)
Glucose-Capillary: 150 mg/dL — ABNORMAL HIGH (ref 70–99)

## 2018-06-29 LAB — BASIC METABOLIC PANEL
Anion gap: 11 (ref 5–15)
BUN: 11 mg/dL (ref 8–23)
CO2: 23 mmol/L (ref 22–32)
Calcium: 9 mg/dL (ref 8.9–10.3)
Chloride: 106 mmol/L (ref 98–111)
Creatinine, Ser: 0.8 mg/dL (ref 0.44–1.00)
GFR calc Af Amer: >60 mL/min (ref >60)
GFR calc non Af Amer: >60 mL/min (ref >60)
Glucose, Bld: 268 mg/dL — ABNORMAL HIGH (ref 70–99)
POTASSIUM: 3.8 mmol/L (ref 3.5–5.1)
Sodium: 140 mmol/L (ref 135–145)

## 2018-06-29 MED ORDER — INSULIN ASPART 100 UNIT/ML ~~LOC~~ SOLN
0.0000 [IU] | Freq: Three times a day (TID) | SUBCUTANEOUS | Status: DC
  Administered 2018-06-29 – 2018-07-01 (×5): 2 [IU] via SUBCUTANEOUS
  Administered 2018-07-01: 3 [IU] via SUBCUTANEOUS
  Filled 2018-06-29 (×6): qty 1

## 2018-06-29 MED ORDER — INSULIN ASPART 100 UNIT/ML ~~LOC~~ SOLN: 0.0000 [IU] | Freq: Every day | SUBCUTANEOUS | Status: DC

## 2018-06-29 NOTE — Progress Notes (Addendum)
[place] at [facility]                                                                                                                                                                                  Patient Demographics   Sarah [name], is a 68 y.o. female, DOB - [birth year], LAG:[id number]  Admit date - 06/28/2018   Admitting Physician Lance Coon, MD  Outpatient Primary MD for the patient is Crecencio Mc, MD   LOS - 1  Subjective: Pt continues to c/o sob, and wheezing    Review of Systems:   CONSTITUTIONAL: No documented fever. No fatigue, weakness. No weight gain, no weight loss.  EYES: No blurry or double vision.  ENT: No tinnitus. No postnasal drip. No redness of the oropharynx.  RESPIRATORY: No cough,+wheeze, no hemoptysis. +dyspnea.  CARDIOVASCULAR: No chest pain. No orthopnea. No palpitations. No syncope.  GASTROINTESTINAL: No nausea, no vomiting or diarrhea. No abdominal pain. No melena or hematochezia.  GENITOURINARY: No dysuria or hematuria.  ENDOCRINE: No polyuria or nocturia. No heat or cold intolerance.  HEMATOLOGY: No anemia. No bruising. No bleeding.  INTEGUMENTARY: No rashes. No lesions.  MUSCULOSKELETAL: No arthritis. No swelling. No gout.  NEUROLOGIC: No numbness, tingling, or ataxia. No seizure-type activity.  PSYCHIATRIC: No anxiety. No insomnia. No ADD.    Vitals:   Vitals:   06/28/18 2243 06/29/18 0407 06/29/18 0732 06/29/18 1027  BP: (!) 156/69 (!) 147/76 122/75 137/75  Pulse: 92 90 64 78  Resp:   19 16  Temp: 97.8 F (36.6 C) 97.9 F (36.6 C) 97.6 F (36.4 C)   TempSrc: Oral Oral Oral   SpO2: 94% 95% 95% 95%  Weight: 81.3 kg     Height: 5\' 3"  (1.6 m)       Wt Readings from Last 3 Encounters:  06/28/18 81.3 kg  06/27/18 84.1 kg  04/09/18 84.3 kg     Intake/Output Summary (Last 24 hours) at 06/29/2018 1402 Last data filed at 06/29/2018 1344 Gross per 24 hour  Intake 930 ml  Output 850 ml  Net 80 ml     Physical Exam:   GENERAL: Pleasant-appearing in no apparent distress.  HEAD, EYES, EARS, NOSE AND THROAT: Atraumatic, normocephalic. Extraocular muscles are intact. Pupils equal and reactive to light. Sclerae anicteric. No conjunctival injection. No oro-pharyngeal erythema.  NECK: Supple. There is no jugular venous distention. No bruits, no lymphadenopathy, no thyromegaly.  HEART: Regular rate and rhythm,. No murmurs, no rubs, no clicks.  LUNGS: Clear to auscultation bilaterally. No rales or rhonchi. No wheezes.  ABDOMEN: Soft, flat, nontender, nondistended. Has good bowel sounds. No hepatosplenomegaly appreciated.  EXTREMITIES: No evidence  of any cyanosis, clubbing, or peripheral edema.  +2 pedal and radial pulses bilaterally.  NEUROLOGIC: The patient is alert, awake, and oriented x3 with no focal motor or sensory deficits appreciated bilaterally.  SKIN: Moist and warm with no rashes appreciated.  Psych: Not anxious, depressed LN: No inguinal LN enlargement    Antibiotics   Anti-infectives (From admission, onward)   None      Medications   Scheduled Meds: . citalopram  20 mg Oral Daily  . enoxaparin (LOVENOX) injection  40 mg Subcutaneous Q24H  . famotidine  20 mg Oral QPM  . insulin aspart  0-5 Units Subcutaneous QHS  . insulin aspart  0-9 Units Subcutaneous TID WC  . levothyroxine  50 mcg Oral QAC breakfast  . methylPREDNISolone (SOLU-MEDROL) injection  60 mg Intravenous Q6H  . pantoprazole  40 mg Oral Daily   Continuous Infusions: PRN Meds:.acetaminophen **OR** acetaminophen, benzonatate, guaiFENesin-dextromethorphan, ipratropium-albuterol, ondansetron **OR** ondansetron (ZOFRAN) IV   Data Review:   Micro Results No results found for this or any previous visit (from the past 240 hour(s)).  Radiology Reports Dg Chest 2 View  Result Date: 06/28/2018 CLINICAL DATA:  Shortness of breath for 2 days, worsening today. Generalized chest tightness and cough. History of  asthma. Tightness in chest. Nonsmoker. EXAM: CHEST - 2 VIEW COMPARISON:  None. FINDINGS: Borderline cardiomegaly. Lungs are clear. No pleural effusion or pneumothorax seen. No acute or suspicious osseous finding. IMPRESSION: No active cardiopulmonary disease. No evidence of pneumonia or pulmonary edema. Electronically Signed   By: Franki Cabot M.D.   On: 06/28/2018 14:36     CBC Recent Labs  Lab 06/28/18 1358 06/29/18 0450  WBC 4.8 4.3  HGB 12.3 11.6*  HCT 36.7 35.8*  PLT 166 167  MCV 95.8 98.4  MCH 32.1 31.9  MCHC 33.5 32.4  RDW 13.3 13.3    Chemistries  Recent Labs  Lab 06/28/18 1358 06/29/18 0450  NA 139 140  K 3.7 3.8  CL 106 106  CO2 25 23  GLUCOSE 114* 268*  BUN 9 11  CREATININE 0.71 0.80  CALCIUM 9.0 9.0   ------------------------------------------------------------------------------------------------------------------ estimated creatinine clearance is 68 mL/min (by C-G formula based on SCr of 0.8 mg/dL). ------------------------------------------------------------------------------------------------------------------ No results for input(s): HGBA1C in the last 72 hours. ------------------------------------------------------------------------------------------------------------------ No results for input(s): CHOL, HDL, LDLCALC, TRIG, CHOLHDL, LDLDIRECT in the last 72 hours. ------------------------------------------------------------------------------------------------------------------ No results for input(s): TSH, T4TOTAL, T3FREE, THYROIDAB in the last 72 hours.  Invalid input(s): FREET3 ------------------------------------------------------------------------------------------------------------------ No results for input(s): VITAMINB12, FOLATE, FERRITIN, TIBC, IRON, RETICCTPCT in the last 72 hours.  Coagulation profile No results for input(s): INR, PROTIME in the last 168 hours.  No results for input(s): DDIMER in the last 72 hours.  Cardiac Enzymes Recent  Labs  Lab 06/28/18 1358  TROPONINI <0.03   ------------------------------------------------------------------------------------------------------------------ Invalid input(s): POCBNP    Assessment & Plan   Pt is 68 y/o with h/o ashtma with sob   #1  Asthma exacerbation -continue IV Solu-Medrol, duo nebs, PRN antitussive, continue home meds  #2  Hyperlipidemia -continue home dose antilipid  #3  Esophageal reflux -home dose PPI  #4  Hypothyroid -home dose thyroid replacement  #5 hyperglycemia check hgba1c, likely due to steroids start low-dose sliding scale insulin      Code Status Orders  (From admission, onward)         Start     Ordered   06/28/18 2309  Full code  Continuous     06/28/18 2308  Code Status History    This patient has a current code status but no historical code status.    Advance Directive Documentation     Most Recent Value  Type of Advance Directive  Healthcare Power of Attorney  Pre-existing out of facility DNR order (yellow form or pink MOST form)  -  "MOST" Form in Place?  -           Consults none  DVT Prophylaxis  Lovenox   Lab Results  Component Value Date   PLT 167 06/29/2018     Time Spent in minutes 44min Greater than 50% of time spent in care coordination and counseling patient regarding the condition and plan of care.   Dustin Flock M.D on 06/29/2018 at 2:02 PM  Between 7am to 6pm - Pager - [phone]  After 6pm go to [email or web address] - Proofreader  [employer] [facility]   Office  [phone]

## 2018-06-29 NOTE — Care Management Obs Status (Signed)
[employer] NOTIFICATION   Patient Details  Name: [name] MRN: [id number] Date of Birth: [birth year]   Medicare Observation Status Notification Given:  Yes    Elza Rafter, RN 06/29/2018, 1:58 PM

## 2018-06-29 NOTE — Care Management CC44 (Signed)
Condition Code 44 Documentation Completed  Patient Details  Name: LAIZA VEENSTRA MRN: [id number] Date of Birth: Feb 06, 1951   Condition Code 44 given:  Yes Patient signature on Condition Code 44 notice:  Yes Documentation of 2 MD's agreement:  Yes Code 44 added to claim:  Yes    Elza Rafter, RN 06/29/2018, 1:58 PM

## 2018-06-29 NOTE — Progress Notes (Addendum)
Advanced care plan.  Purpose of the Encounter: CODE STATUS  Parties in [place] her self  Patient's Decision Capacity:intact  Subjective/Patient's story: Patient is 68 y.o with asthma admitted for exaceberation   Objective/Medical story  Sarah [name]  is a 68 y.o. female who presents with chief complaint as above.  Patient presents the ED with acute worsening of shortness of breath.  She states that her asthma has been bothering her significantly over the past couple of months, but that it got acutely worse over the last 2 days.  She has had significant wheezing and increased cough.   Goals of care determination:  Patient states she would like everything to be done, including cpr and intubation She does state that she has healthcare power of attorney and a living will for further goals of care in the future   CODE STATUS:  Full code  Time spent discussing advanced care planning: 16 minutes

## 2018-06-29 NOTE — Care Management Note (Signed)
Case Management Note  Patient Details  Name: Sarah [name] MRN: [id number] Date of Birth: 11-Aug-1950  Subjective/Objective:   Independent in all adls, denies issues accessing medical care, obtaining medications or with transportation.  Current with PCP.  No discharge needs identified at present by care manager or members of care team                   Action/Plan:   Expected Discharge Date:  06/29/18               Expected Discharge Plan:  Home/Self Care  In-House Referral:     Discharge planning Services  CM Consult  Post Acute Care Choice:    Choice offered to:     DME Arranged:    DME Agency:     HH Arranged:  Patient Refused Banks Agency:     Status of Service:  Completed, signed off  If discussed at [employer] of Stay Meetings, dates discussed:    Additional Comments:  Elza Rafter, RN 06/29/2018, 2:05 PM

## 2018-06-30 LAB — HEMOGLOBIN A1C
HEMOGLOBIN A1C: 5.8 % — AB (ref 4.8–5.6)
MEAN PLASMA GLUCOSE: 119.76 mg/dL

## 2018-06-30 LAB — GLUCOSE, CAPILLARY
Glucose-Capillary: 193 mg/dL — ABNORMAL HIGH (ref 70–99)
Glucose-Capillary: 174 mg/dL — ABNORMAL HIGH (ref 70–99)
Glucose-Capillary: 168 mg/dL — ABNORMAL HIGH (ref 70–99)
Glucose-Capillary: 158 mg/dL — ABNORMAL HIGH (ref 70–99)

## 2018-06-30 LAB — HIV ANTIBODY (ROUTINE TESTING W REFLEX): HIV Screen 4th Generation wRfx: NONREACTIVE

## 2018-06-30 MED ORDER — DOXYCYCLINE HYCLATE 100 MG PO TABS
100.0000 mg | ORAL_TABLET | Freq: Two times a day (BID) | ORAL | Status: DC
  Administered 2018-06-30 – 2018-07-01 (×3): 100 mg via ORAL
  Filled 2018-06-30 (×3): qty 1

## 2018-06-30 NOTE — Progress Notes (Signed)
[facility]                                                                                                                                                                                  Patient Demographics   Sarah [name], is a 68 y.o. female, DOB - April 24, 1951, AJO:[id number]  Admit date - 06/28/2018   Admitting Physician Lance Coon, MD  Outpatient Primary MD for the patient is Crecencio Mc, MD   LOS - 1  Subjective: Pt continues to c/o sob, and wheezing, and cough    Review of Systems:   CONSTITUTIONAL: No documented fever. No fatigue, weakness. No weight gain, no weight loss.  EYES: No blurry or double vision.  ENT: No tinnitus. No postnasal drip. No redness of the oropharynx.  RESPIRATORY: Positive  cough,+wheeze, no hemoptysis. +dyspnea.  CARDIOVASCULAR: No chest pain. No orthopnea. No palpitations. No syncope.  GASTROINTESTINAL: No nausea, no vomiting or diarrhea. No abdominal pain. No melena or hematochezia.  GENITOURINARY: No dysuria or hematuria.  ENDOCRINE: No polyuria or nocturia. No heat or cold intolerance.  HEMATOLOGY: No anemia. No bruising. No bleeding.  INTEGUMENTARY: No rashes. No lesions.  MUSCULOSKELETAL: No arthritis. No swelling. No gout.  NEUROLOGIC: No numbness, tingling, or ataxia. No seizure-type activity.  PSYCHIATRIC: No anxiety. No insomnia. No ADD.    Vitals:   Vitals:   06/29/18 1610 06/29/18 1943 06/30/18 0423 06/30/18 0720  BP: 125/71 124/68 138/76 132/78  Pulse: 80 66 72 78  Resp: 19   19  Temp: (!) 97.5 F (36.4 C) 97.9 F (36.6 C) 98.2 F (36.8 C) 98 F (36.7 C)  TempSrc: Oral Oral Oral Oral  SpO2: 96% 96% 94% 95%  Weight:      Height:        Wt Readings from Last 3 Encounters:  06/28/18 81.3 kg  06/27/18 84.1 kg  04/09/18 84.3 kg     Intake/Output Summary (Last 24 hours) at 06/30/2018 1330 Last data filed at 06/30/2018 1010 Gross per 24 hour  Intake 840 ml  Output 200 ml  Net  640 ml    Physical Exam:   GENERAL: Pleasant-appearing in no apparent distress.  HEAD, EYES, EARS, NOSE AND THROAT: Atraumatic, normocephalic. Extraocular muscles are intact. Pupils equal and reactive to light. Sclerae anicteric. No conjunctival injection. No oro-pharyngeal erythema.  NECK: Supple. There is no jugular venous distention. No bruits, no lymphadenopathy, no thyromegaly.  HEART: Regular rate and rhythm,. No murmurs, no rubs, no clicks.  LUNGS: Bilateral wheezing throughout both lung ABDOMEN: Soft, flat, nontender, nondistended. Has good bowel sounds. No hepatosplenomegaly appreciated.  EXTREMITIES: No evidence of any cyanosis, clubbing, or  peripheral edema.  +2 pedal and radial pulses bilaterally.  NEUROLOGIC: The patient is alert, awake, and oriented x3 with no focal motor or sensory deficits appreciated bilaterally.  SKIN: Moist and warm with no rashes appreciated.  Psych: Not anxious, depressed LN: No inguinal LN enlargement    Antibiotics   Anti-infectives (From admission, onward)   Start     Dose/Rate Route Frequency Ordered Stop   06/30/18 1000  doxycycline (VIBRA-TABS) tablet 100 mg     100 mg Oral Every 12 hours 06/30/18 0915        Medications   Scheduled Meds: . citalopram  20 mg Oral Daily  . doxycycline  100 mg Oral Q12H  . enoxaparin (LOVENOX) injection  40 mg Subcutaneous Q24H  . famotidine  20 mg Oral QPM  . insulin aspart  0-5 Units Subcutaneous QHS  . insulin aspart  0-9 Units Subcutaneous TID WC  . levothyroxine  50 mcg Oral QAC breakfast  . methylPREDNISolone (SOLU-MEDROL) injection  60 mg Intravenous Q6H  . pantoprazole  40 mg Oral Daily   Continuous Infusions: PRN Meds:.acetaminophen **OR** acetaminophen, benzonatate, guaiFENesin-dextromethorphan, ipratropium-albuterol, ondansetron **OR** ondansetron (ZOFRAN) IV   Data Review:   Micro Results No results found for this or any previous visit (from the past 240 hour(s)).  Radiology  Reports Dg Chest 2 View  Result Date: 06/28/2018 CLINICAL DATA:  Shortness of breath for 2 days, worsening today. Generalized chest tightness and cough. History of asthma. Tightness in chest. Nonsmoker. EXAM: CHEST - 2 VIEW COMPARISON:  None. FINDINGS: Borderline cardiomegaly. Lungs are clear. No pleural effusion or pneumothorax seen. No acute or suspicious osseous finding. IMPRESSION: No active cardiopulmonary disease. No evidence of pneumonia or pulmonary edema. Electronically Signed   By: Franki Cabot M.D.   On: 06/28/2018 14:36     CBC Recent Labs  Lab 06/28/18 1358 06/29/18 0450  WBC 4.8 4.3  HGB 12.3 11.6*  HCT 36.7 35.8*  PLT 166 167  MCV 95.8 98.4  MCH 32.1 31.9  MCHC 33.5 32.4  RDW 13.3 13.3    Chemistries  Recent Labs  Lab 06/28/18 1358 06/29/18 0450  NA 139 140  K 3.7 3.8  CL 106 106  CO2 25 23  GLUCOSE 114* 268*  BUN 9 11  CREATININE 0.71 0.80  CALCIUM 9.0 9.0   ------------------------------------------------------------------------------------------------------------------ estimated creatinine clearance is 68 mL/min (by C-G formula based on SCr of 0.8 mg/dL). ------------------------------------------------------------------------------------------------------------------ No results for input(s): HGBA1C in the last 72 hours. ------------------------------------------------------------------------------------------------------------------ No results for input(s): CHOL, HDL, LDLCALC, TRIG, CHOLHDL, LDLDIRECT in the last 72 hours. ------------------------------------------------------------------------------------------------------------------ No results for input(s): TSH, T4TOTAL, T3FREE, THYROIDAB in the last 72 hours.  Invalid input(s): FREET3 ------------------------------------------------------------------------------------------------------------------ No results for input(s): VITAMINB12, FOLATE, FERRITIN, TIBC, IRON, RETICCTPCT in the last 72  hours.  Coagulation profile No results for input(s): INR, PROTIME in the last 168 hours.  No results for input(s): DDIMER in the last 72 hours.  Cardiac Enzymes Recent Labs  Lab 06/28/18 1358  TROPONINI <0.03   ------------------------------------------------------------------------------------------------------------------ Invalid input(s): POCBNP    Assessment & Plan   Pt is 68 y/o with h/o ashtma with sob   #1   Acute Asthma exacerbation -continue IV Solu-Medrol, duo nebs, PRN antitussive, continue home meds I will add azithromycin for acute bronchitis  #2  Hyperlipidemia -continue home dose antilipid  #3  Esophageal reflux -home dose PPI  #4  Hypothyroid -home dose thyroid replacement  #5 hyperglycemia check hgba1c, likely due to steroids start low-dose sliding scale insulin  Code Status Orders  (From admission, onward)         Start     Ordered   06/28/18 2309  Full code  Continuous     06/28/18 2308        Code Status History    This patient has a current code status but no historical code status.    Advance Directive Documentation     Most Recent Value  Type of Advance Directive  Healthcare Power of Attorney  Pre-existing out of facility DNR order (yellow form or pink MOST form)  -  "MOST" Form in Place?  -           Consults none  DVT Prophylaxis  Lovenox   Lab Results  Component Value Date   PLT 167 06/29/2018     Time Spent in minutes 83min Greater than 50% of time spent in care coordination and counseling patient regarding the condition and plan of care.   Dustin Flock M.D on 06/30/2018 at 1:30 PM  Between 7am to 6pm - Pager - [phone]  After 6pm go to [email or web address] - Proofreader  [facility]   Office  [phone]

## 2018-07-01 LAB — GLUCOSE, CAPILLARY
Glucose-Capillary: 198 mg/dL — ABNORMAL HIGH (ref 70–99)
Glucose-Capillary: 237 mg/dL — ABNORMAL HIGH (ref 70–99)

## 2018-07-01 MED ORDER — DOXYCYCLINE HYCLATE 100 MG PO TABS: 100.0000 mg | ORAL_TABLET | Freq: Two times a day (BID) | ORAL | 0 refills | Status: AC

## 2018-07-01 MED ORDER — PREDNISONE 10 MG (21) PO TBPK: ORAL_TABLET | ORAL | 0 refills | Status: DC

## 2018-07-01 MED ORDER — FLUTICASONE-SALMETEROL 250-50 MCG/DOSE IN AEPB: 1.0000 | INHALATION_SPRAY | Freq: Two times a day (BID) | RESPIRATORY_TRACT | 11 refills | Status: DC

## 2018-07-01 MED ORDER — IPRATROPIUM-ALBUTEROL 0.5-2.5 (3) MG/3ML IN SOLN: 3.0000 mL | Freq: Four times a day (QID) | RESPIRATORY_TRACT | 2 refills | Status: DC | PRN

## 2018-07-01 MED ORDER — GUAIFENESIN-DM 100-10 MG/5ML PO SYRP: 5.0000 mL | ORAL_SOLUTION | ORAL | 0 refills | Status: DC | PRN

## 2018-07-01 NOTE — Discharge Summary (Signed)
[facility] - [employer] [facility] [place], 68 y.o., DOB 03-31-1951, MRN [id number]. Admission date: 06/28/2018 Discharge Date 07/01/2018 Primary MD Crecencio Mc, MD Admitting Physician Lance Coon, MD  Admission Diagnosis  Severe persistent asthma with exacerbation [J45.51]  Discharge Diagnosis   Principal Problem: Acute asthma exacerbation Hyperlipidemia GERD Hypothyroidism Hyperglycemia due to steroids no evidence of diabetes type [place]  is a 68 y.o. female who presents with chief complaint as above.  Patient presents the ED with acute worsening of shortness of breath.  She states that her asthma has been bothering her significantly over the past couple of months, but that it got acutely worse over the last 2 days.  Patient was seen in the ED for that and admitted for acute asthma exacerbation.  She had significant wheezing which was treated with nebs steroids and now she is improved with her breathing.  But still has wheezing.  I have arranged for her to get a nebulizer machine.  She will follow-up outpatient with pulmonary as well.            Consults  None  Significant Tests:  See full reports for all details     Dg Chest 2 View  Result Date: 06/28/2018 CLINICAL DATA:  Shortness of breath for 2 days, worsening today. Generalized chest tightness and cough. History of asthma. Tightness in chest. Nonsmoker. EXAM: CHEST - 2 VIEW COMPARISON:  None. FINDINGS: Borderline cardiomegaly. Lungs are clear. No pleural effusion or pneumothorax seen. No acute or suspicious osseous finding. IMPRESSION: No active cardiopulmonary disease. No evidence of pneumonia or pulmonary edema. Electronically Signed   By: Franki Cabot M.D.   On: 06/28/2018 14:36       Today   Subjective:   Sarah [name] patient doing much better denies any complaints  Objective:   Blood pressure 107/65, pulse 73, temperature (!) 97.5 F (36.4 C),  temperature source Oral, resp. rate (!) 24, height 5\' 3"  (1.6 m), weight 81.3 kg, SpO2 97 %.  .  Intake/Output Summary (Last 24 hours) at 07/01/2018 1323 Last data filed at 07/01/2018 1230 Gross per 24 hour  Intake 720 ml  Output 450 ml  Net 270 ml    Exam VITAL SIGNS: Blood pressure 107/65, pulse 73, temperature (!) 97.5 F (36.4 C), temperature source Oral, resp. rate (!) 24, height 5\' 3"  (1.6 m), weight 81.3 kg, SpO2 97 %.  GENERAL:  68 y.o.-year-old patient lying in the bed with no acute distress.  EYES: Pupils equal, round, reactive to light and accommodation. No scleral icterus. Extraocular muscles intact.  HEENT: Head atraumatic, normocephalic. Oropharynx and nasopharynx clear.  NECK:  Supple, no jugular venous distention. No thyroid enlargement, no tenderness.  LUNGS: Normal breath sounds bilaterally, no wheezing, rales,rhonchi or crepitation. No use of accessory muscles of respiration.  CARDIOVASCULAR: S1, S2 normal. No murmurs, rubs, or gallops.  ABDOMEN: Soft, nontender, nondistended. Bowel sounds present. No organomegaly or mass.  EXTREMITIES: No pedal edema, cyanosis, or clubbing.  NEUROLOGIC: Cranial nerves II through XII are intact. Muscle strength 5/5 in all extremities. Sensation intact. Gait not checked.  PSYCHIATRIC: The patient is alert and oriented x 3.  SKIN: No obvious rash, lesion, or ulcer.   Data Review     CBC w Diff:  Lab Results  Component Value Date   WBC 4.3 06/29/2018   HGB 11.6 (L) 06/29/2018   HCT 35.8 (L) 06/29/2018   PLT 167 06/29/2018  LYMPHOPCT 27.1 06/15/2017   MONOPCT 7.3 04/06/2018   EOSPCT 3.7 04/06/2018   BASOPCT 1.2 04/06/2018   CMP:  Lab Results  Component Value Date   NA 140 06/29/2018   K 3.8 06/29/2018   CL 106 06/29/2018   CO2 23 06/29/2018   BUN 11 06/29/2018   CREATININE 0.80 06/29/2018   CREATININE 0.84 04/06/2018   PROT 6.9 04/06/2018   ALBUMIN 3.9 06/15/2017   BILITOT 0.5 04/06/2018   ALKPHOS 70 06/15/2017    AST 11 04/06/2018   ALT 9 04/06/2018  .  Micro Results No results found for this or any previous visit (from the past 240 hour(s)).      Code Status Orders  (From admission, onward)         Start     Ordered   06/28/18 2309  Full code  Continuous     06/28/18 2308        Code Status History    This patient has a current code status but no historical code status.    Advance Directive Documentation     Most Recent Value  Type of Advance Directive  Healthcare Power of Attorney  Pre-existing out of facility DNR order (yellow form or pink MOST form)  -  "MOST" Form in Place?  -          Follow-up Information    Crecencio Mc, MD Follow up in 6 day(s).   Specialty:  Internal Medicine Contact information: [address] [place] [address] [phone]        Laverle Hobby, MD Follow up in 2 week(s).   Specialty:  Pulmonary Disease Why:  ashtma poor control Contact information: [address] [place] [address] [phone]           Discharge Medications   Allergies as of 07/01/2018   No Known Allergies     Medication List    TAKE these medications   albuterol 108 (90 Base) MCG/ACT inhaler Commonly known as:  PROVENTIL HFA;VENTOLIN HFA Inhale 2 puffs into the lungs every 6 (six) hours as needed for wheezing or shortness of breath.   CALCIUM 600 + D PO Take 2 tablets by mouth daily.   citalopram 20 MG tablet Commonly known as:  CELEXA Take 1 tablet (20 mg total) by mouth daily.   doxycycline 100 MG tablet Commonly known as:  VIBRA-TABS Take 1 tablet (100 mg total) by mouth every 12 (twelve) hours for 5 days.   famotidine 20 MG tablet Commonly known as:  PEPCID TAKE 1 TABLET BY MOUTH DAILY. BEFORE DINNER What changed:    how much to take  how to take this  when to take this  additional instructions   fexofenadine 180 MG tablet Commonly known as:  ALLEGRA Take 180 mg by mouth daily.    Fish Oil 1200 MG Caps Take 1,200 mg by mouth daily.   Fluticasone-Salmeterol 250-50 MCG/DOSE Aepb Commonly known as:  Advair Diskus Inhale 1 puff into the lungs 2 (two) times daily.   guaiFENesin-dextromethorphan 100-10 MG/5ML syrup Commonly known as:  ROBITUSSIN DM Take 5 mLs by mouth every 4 (four) hours as needed for cough.   ibuprofen 200 MG tablet Commonly known as:  ADVIL,MOTRIN Take 600 mg by mouth daily as needed for moderate pain.   ipratropium-albuterol 0.5-2.5 (3) MG/3ML Soln Commonly known as:  DUONEB Take 3 mLs by nebulization every 6 (six) hours as needed.   levothyroxine 50 MCG tablet Commonly  known as:  SYNTHROID, LEVOTHROID TAKE 1 TABLET BY MOUTH DAILY   pantoprazole 40 MG tablet Commonly known as:  PROTONIX TAKE 1 TABLET BY MOUTH DAILY. IN AM 30 MINUTES BEFORE EATING What changed:    how much to take  how to take this  when to take this  additional instructions   predniSONE 10 MG (21) Tbpk tablet Commonly known as:  STERAPRED UNI-PAK 21 TAB Start at 60mg  taper by 10mg  until complete            Durable Medical Equipment  (From admission, onward)         Start     Ordered   07/01/18 0944  For home use only DME Nebulizer machine  Once    Question:  Patient needs a nebulizer to treat with the following condition  Answer:  Asthma   07/01/18 0943             Total Time in preparing paper work, data evaluation and todays exam - 36 minutes  Dustin Flock M.D on 07/01/2018 at [place]  [phone]

## 2018-07-01 NOTE — Plan of Care (Signed)
  Problem: Health Behavior/Discharge Planning: Goal: Ability to manage health-related needs will improve Outcome: Adequate for Discharge   Problem: Clinical Measurements: Goal: Ability to maintain clinical measurements within normal limits will improve Outcome: Adequate for Discharge   

## 2018-07-01 NOTE — Progress Notes (Signed)
Patient discharged home today, IV removed, telebox returned. Patient educated on follow up appointments and medication regimen. Patient has no complaints at this time.

## 2018-07-02 ENCOUNTER — Telehealth: Payer: Self-pay | Admitting: Internal Medicine

## 2018-07-02 DIAGNOSIS — H57813 Brow ptosis, bilateral: Secondary | ICD-10-CM | POA: Diagnosis not present

## 2018-07-02 NOTE — Telephone Encounter (Signed)
Pt needs a hospital f/u appt.  Pt was discharged on 07/01/18.

## 2018-07-02 NOTE — Telephone Encounter (Signed)
Copied from [place] [id number]. Topic: Appointment Scheduling - Prior Auth Required for Appointment >> Jul 02, 2018  1:42 PM Blase Mess A wrote: Patient is calling to request a hospital follow up for 6 days. Appointments available are at the end of march. Please advise. Thank you.  [phone]

## 2018-07-02 NOTE — Telephone Encounter (Signed)
First attempt to reach patient for transitional care management.  Left message to call the office back. Potential HFU date is 3/13 at 1130.  Will follow as appropriate.

## 2018-07-03 NOTE — Telephone Encounter (Signed)
Second attempt made for TCM ( transitional Care Management ) no answer left detailed message asking patient to return call to office.

## 2018-07-04 NOTE — Telephone Encounter (Signed)
Transition Care Management Follow-up Telephone Call  How have you been since you were released from the hospital? Patient says she is feeling better not wheezing as bad, just still really tired and weak.   Do you understand why you were in the hospital? yes   Do you understand the discharge instrcutions? yes  Items Reviewed:  Medications reviewed: yes  Allergies reviewed: yes  Dietary changes reviewed: yes  Referrals reviewed: yes   Functional Questionnaire:   Activities of Daily Living (ADLs):   She states they are independent in the following: ambulation, bathing and hygiene, feeding, continence, grooming, toileting and dressing States they require assistance with the following: No assistance at this time .   Any transportation issues/concerns?: no   Any patient concerns? Yes , Advair prescribed in hospital patient cannot Afford the Advair insurance will not cover patient needs alternative.   Confirmed importance and date/time of follow-up visits scheduled: yes   Confirmed with patient if condition begins to worsen call PCP or go to the ER.  Patient was given the Call-a-Nurse line [phone]: yes

## 2018-07-06 ENCOUNTER — Ambulatory Visit (INDEPENDENT_AMBULATORY_CARE_PROVIDER_SITE_OTHER): Payer: Medicare Other | Admitting: Internal Medicine

## 2018-07-06 ENCOUNTER — Other Ambulatory Visit: Payer: Self-pay

## 2018-07-06 ENCOUNTER — Encounter: Payer: Self-pay | Admitting: Internal Medicine

## 2018-07-06 VITALS — BP 150/65 | HR 76 | Temp 97.8°F | Wt 187.2 lb

## 2018-07-06 DIAGNOSIS — Z09 Encounter for follow-up examination after completed treatment for conditions other than malignant neoplasm: Secondary | ICD-10-CM

## 2018-07-06 DIAGNOSIS — J45901 Unspecified asthma with (acute) exacerbation: Secondary | ICD-10-CM | POA: Diagnosis not present

## 2018-07-06 MED ORDER — BUDESONIDE 0.25 MG/2ML IN SUSP: 0.2500 mg | Freq: Two times a day (BID) | RESPIRATORY_TRACT | 12 refills | Status: DC

## 2018-07-06 MED ORDER — METHYLPREDNISOLONE ACETATE 40 MG/ML IJ SUSP
40.0000 mg | Freq: Once | INTRAMUSCULAR | Status: AC
  Administered 2018-07-06: 80 mg via INTRAMUSCULAR

## 2018-07-06 MED ORDER — PREDNISONE 10 MG (21) PO TBPK: ORAL_TABLET | ORAL | 0 refills | Status: DC

## 2018-07-06 NOTE — Progress Notes (Signed)
Subjective:  Patient ID: Sarah [name], female    DOB: 1951-02-05  Age: 68 y.o. MRN: [id number]  CC: The primary encounter diagnosis was Exacerbation of asthma, unspecified asthma severity, unspecified whether persistent. A diagnosis of Hospital discharge follow-up was also pertinent to this visit.  HPI [name] presents for hospital follow up .  Patient was admitted to [facility]  On March 5  with acute resp failure  secondary to asthma exacerbation.  She was treated with nebulized bronchodilators and steroids and discharged on March 8 with nebulized ipatropirum/albuterol, 6 day prednisone taper,  Doxycycline,  And rx for Advair that she has been unable to fill  due to [facility] cost of $300.  Has been using the Duoneb every 12 hours  Still short of breath with exertion and prolonged conversation.  Denies chest pain, dizziness and diarrhea.     Outpatient Medications Prior to Visit  Medication Sig Dispense Refill  . albuterol (PROVENTIL HFA;VENTOLIN HFA) 108 (90 Base) MCG/ACT inhaler Inhale 2 puffs into the lungs every 6 (six) hours as needed for wheezing or shortness of breath. 1 Inhaler 11  . Calcium Carb-Cholecalciferol (CALCIUM 600 + D PO) Take 2 tablets by mouth daily.    . citalopram (CELEXA) 20 MG tablet Take 1 tablet (20 mg total) by mouth daily. 30 tablet 11  . doxycycline (VIBRA-TABS) 100 MG tablet Take 1 tablet (100 mg total) by mouth every 12 (twelve) hours for 5 days. 10 tablet 0  . famotidine (PEPCID) 20 MG tablet TAKE 1 TABLET BY MOUTH DAILY. BEFORE DINNER (Patient taking differently: Take 20 mg by mouth every evening. ) 30 tablet 11  . fexofenadine (ALLEGRA) 180 MG tablet Take 180 mg by mouth daily.    Marland Kitchen guaiFENesin-dextromethorphan (ROBITUSSIN DM) 100-10 MG/5ML syrup Take 5 mLs by mouth every 4 (four) hours as needed for cough. 118 mL 0  . ibuprofen (ADVIL,MOTRIN) 200 MG tablet Take 600 mg by mouth daily as needed for moderate pain.     Marland Kitchen ipratropium-albuterol (DUONEB) 0.5-2.5 (3)  MG/3ML SOLN Take 3 mLs by nebulization every 6 (six) hours as needed. 360 mL 2  . levothyroxine (SYNTHROID, LEVOTHROID) 50 MCG tablet TAKE 1 TABLET BY MOUTH DAILY (Patient taking differently: Take 50 mcg by mouth daily. ) 90 tablet 3  . Omega-3 Fatty Acids (FISH OIL) 1200 MG CAPS Take 1,200 mg by mouth daily.     . pantoprazole (PROTONIX) 40 MG tablet TAKE 1 TABLET BY MOUTH DAILY. IN AM 30 MINUTES BEFORE EATING (Patient taking differently: Take 40 mg by mouth daily. ) 30 tablet 11  . Fluticasone-Salmeterol (ADVAIR DISKUS) 250-50 MCG/DOSE AEPB Inhale 1 puff into the lungs 2 (two) times daily. 60 each 11  . predniSONE (STERAPRED UNI-PAK 21 TAB) 10 MG (21) TBPK tablet Start at 60mg  taper by 10mg  until complete 21 tablet 0   No facility-administered medications prior to visit.     Review of Systems;  Patient denies headache, fevers, malaise, unintentional weight loss, skin rash, eye pain, sinus congestion and sinus pain, sore throat, dysphagia,  hemoptysis , cough, chest pain, palpitations, orthopnea, edema, abdominal pain, nausea, melena, diarrhea, constipation, flank pain, dysuria, hematuria, urinary  Frequency, nocturia, numbness, tingling, seizures,  Focal weakness, Loss of consciousness,  Tremor, insomnia, depression, anxiety, and suicidal ideation.      Objective:  BP (!) 150/65   Pulse 76   Temp 97.8 F (36.6 C) (Oral)   Wt 187 lb 3.2 oz (84.9 kg)   SpO2 98%  BMI 33.16 kg/m   BP Readings from Last 3 Encounters:  07/06/18 (!) 150/65  07/01/18 107/65  06/27/18 116/66    Wt Readings from Last 3 Encounters:  07/06/18 187 lb 3.2 oz (84.9 kg)  06/28/18 179 lb 3.2 oz (81.3 kg)  06/27/18 185 lb 8 oz (84.1 kg)    General appearance: alert, cooperative and appears stated age Ears: normal TM's and external ear canals both ears Throat: lips, mucosa, and tongue normal; teeth and gums normal Neck: no adenopathy, no carotid bruit, supple, symmetrical, trachea midline and thyroid not  enlarged, symmetric, no tenderness/mass/nodules Back: symmetric, no curvature. ROM normal. No CVA tenderness. Lungs: clear to auscultation bilaterally Heart: regular rate and rhythm, S1, S2 normal, no murmur, click, rub or gallop Abdomen: soft, non-tender; bowel sounds normal; no masses,  no organomegaly Pulses: 2+ and symmetric Skin: Skin color, texture, turgor normal. No rashes or lesions Lymph nodes: Cervical, supraclavicular, and axillary nodes normal.  Lab Results  Component Value Date   HGBA1C 5.8 (H) 06/29/2018    Lab Results  Component Value Date   CREATININE 0.80 06/29/2018   CREATININE 0.71 06/28/2018   CREATININE 0.84 04/06/2018    Lab Results  Component Value Date   WBC 4.3 06/29/2018   HGB 11.6 (L) 06/29/2018   HCT 35.8 (L) 06/29/2018   PLT 167 06/29/2018   GLUCOSE 268 (H) 06/29/2018   CHOL 222 (H) 04/06/2018   TRIG 66 04/06/2018   HDL 64 04/06/2018   LDLCALC 142 (H) 04/06/2018   ALT 9 04/06/2018   AST 11 04/06/2018   NA 140 06/29/2018   K 3.8 06/29/2018   CL 106 06/29/2018   CREATININE 0.80 06/29/2018   BUN 11 06/29/2018   CO2 23 06/29/2018   TSH 0.90 04/06/2018   HGBA1C 5.8 (H) 06/29/2018    Dg Chest 2 View  Result Date: 06/28/2018 CLINICAL DATA:  Shortness of breath for 2 days, worsening today. Generalized chest tightness and cough. History of asthma. Tightness in chest. Nonsmoker. EXAM: CHEST - 2 VIEW COMPARISON:  None. FINDINGS: Borderline cardiomegaly. Lungs are clear. No pleural effusion or pneumothorax seen. No acute or suspicious osseous finding. IMPRESSION: No active cardiopulmonary disease. No evidence of pneumonia or pulmonary edema. Electronically Signed   By: Franki Cabot M.D.   On: 06/28/2018 14:36    Assessment & Plan:   Problem List Items Addressed This Visit    Hospital discharge follow-up    Patient is stable post discharge and has no new issues or questions about discharge plans at the visit today for hospital follow up. All labs ,  imaging studies and progress notes from admission were reviewed with patient today        Asthma exacerbation - Primary    Improved but not resolved.  Repeating prednisone taper,  Adding nebulized steroid since she cannot afford Advair. Continue albuterol q 6 hours       Relevant Medications   budesonide (PULMICORT) 0.25 MG/2ML nebulizer solution   predniSONE (STERAPRED UNI-PAK 21 TAB) 10 MG (21) TBPK tablet   methylPREDNISolone acetate (DEPO-MEDROL) injection 40 mg (Completed)      I have discontinued Hassan Rowan C. Blacklock's Fluticasone-Salmeterol. I have also changed her predniSONE. Additionally, I am having her start on budesonide. Lastly, I am having her maintain her Fish Oil, ibuprofen, albuterol, fexofenadine, Calcium Carb-Cholecalciferol (CALCIUM 600 + D PO), famotidine, pantoprazole, citalopram, levothyroxine, guaiFENesin-dextromethorphan, and ipratropium-albuterol. We administered methylPREDNISolone acetate.  Meds ordered this encounter  Medications  . budesonide (PULMICORT) 0.25 MG/2ML  nebulizer solution    Sig: Take 2 mLs (0.25 mg total) by nebulization 2 (two) times daily.    Dispense:  60 mL    Refill:  12  . predniSONE (STERAPRED UNI-PAK 21 TAB) 10 MG (21) TBPK tablet    Sig: 6 tablets daily x 2,  Reduce by 1 tablet every 2 days until gone    Dispense:  42 tablet    Refill:  0  . methylPREDNISolone acetate (DEPO-MEDROL) injection 40 mg    Medications Discontinued During This Encounter  Medication Reason  . Fluticasone-Salmeterol (ADVAIR DISKUS) 250-50 MCG/DOSE AEPB   . predniSONE (STERAPRED UNI-PAK 21 TAB) 10 MG (21) TBPK tablet     Follow-up: Return in about 3 months (around 10/06/2018).   Crecencio Mc, MD

## 2018-07-06 NOTE — Patient Instructions (Signed)
You are still wheezing  I am repeating your prednisone taper starting tomorrow  FOR 12 DAYS  6 tablets daily for 2 days 5 tablets daily for 2 days 4 tablets daily for 2 days 3 tablets daily for 2 days 2 tablets daily for 2 days 1 tablet daily for 2 days   I AM PRESCRIBING A NEBULIZED STEROID CALLED BUDESONIDE TO TAKE THE PLACE OF THE EXPENSIVE PUFFER. USE IT TWICE DAILY IN YOUR MACHINE  INCREASE YOUR OTHER NEBULIZED MEDICATION (IPATROPIUM) TO 4 TIMES DAILY FOR THE NEXT FEW DAYS   STAY OUT OF CHURCH, GROCERY STORES AND CROWDS AS MUCH AS YOU CAN UNTIL THE CORONOVIRUS PANDEMIC IS OVER

## 2018-07-08 DIAGNOSIS — Z09 Encounter for follow-up examination after completed treatment for conditions other than malignant neoplasm: Secondary | ICD-10-CM | POA: Insufficient documentation

## 2018-07-08 NOTE — Assessment & Plan Note (Signed)
Improved but not resolved.  Repeating prednisone taper,  Adding nebulized steroid since she cannot afford Advair. Continue albuterol q 6 hours

## 2018-07-08 NOTE — Assessment & Plan Note (Signed)
Patient is stable post discharge and has no new issues or questions about discharge plans at the visit today for hospital follow up. All labs , imaging studies and progress notes from admission were reviewed with patient today   

## 2018-07-16 ENCOUNTER — Other Ambulatory Visit: Payer: Self-pay

## 2018-07-16 ENCOUNTER — Encounter: Payer: Self-pay | Admitting: Internal Medicine

## 2018-07-16 ENCOUNTER — Ambulatory Visit: Payer: Medicare Other | Admitting: Internal Medicine

## 2018-07-16 VITALS — BP 148/80 | HR 79 | Ht 63.0 in | Wt 188.0 lb

## 2018-07-16 DIAGNOSIS — J449 Chronic obstructive pulmonary disease, unspecified: Secondary | ICD-10-CM

## 2018-07-16 NOTE — Progress Notes (Signed)
[facility] Consultation      Assessment and Plan:  COPD.  --Will check PFT, pt is a life long smoker.  --Recent exacerbation, but currently feels well and active, symptoms well controlled with budesonide neb, will continue bid.  --PPSV23 04/26/13 --PCV13 09/01/14   Date: 07/16/2018  MRN# [id number] Sarah Sarah [name] [birth year]  Referring Physician:   MALAYZIA Sarah [name] is a 68 y.o. old female seen in consultation for chief complaint of:    Chief Complaint  Patient presents with  . Hospitalization Follow-up    d/c 07/01/18- pt states breathing has improved since discharge. c/o prod cough with thick yellow mucus & occ sob with exertion.     HPI:   The patient is a 68 year old female with a history of COPD.  Was admitted to the hospital from 3/5 to 07/01/2018 with acute COPD exacerbation.  Since getting out of the hospital she feels that her breathing is much better. She was out the past 2 days mowing the lawn. She is doing neb budensonide twice daily and feels that it helps her breathing. She has albuterol but she does not need it as much and that was making her shaky.  She does not smoke or vape and has never smoked.  She has a dog, not in bedroom.  She takes medication for reflux which is controlled.  She denies sinus drainage.  She has never had a lung function test.   **Chest x-ray 06/28/2018>> images personally reviewed; hyperinflation consistent with emphysema.  **CBC 04/06/18>> AEC 241.    PMHX:   Past Medical History:  Diagnosis Date  . Arthritis   . Asthma   . Depression   . Dyspnea    with heat  . Environmental and seasonal allergies   . GERD (gastroesophageal reflux disease)   . Wheezing    Surgical Hx:  Past Surgical History:  Procedure Laterality Date  . ABDOMINAL HYSTERECTOMY  1989  . CATARACT EXTRACTION W/PHACO Right 09/13/2016   Procedure: CATARACT EXTRACTION PHACO AND INTRAOCULAR LENS PLACEMENT (IOC);  Surgeon: Birder Robson, MD;   Location: [facility] ORS;  Service: Ophthalmology;  Laterality: Right;  [place] 00:38 AP% 14.3 CDE 5.51 Fluid pack lot # [id number]  . CATARACT EXTRACTION W/PHACO Left 10/11/2016   Procedure: CATARACT EXTRACTION PHACO AND INTRAOCULAR LENS PLACEMENT (IOC);  Surgeon: Birder Robson, MD;  Location: [facility] ORS;  Service: Ophthalmology;  Laterality: Left;  [place] 00:30 AP% 11.3 CDE 3.50 fluid pack lot # [id number] H  . COLONOSCOPY WITH PROPOFOL N/A 09/30/2014   Procedure: COLONOSCOPY WITH PROPOFOL;  Surgeon: Robert Bellow, MD;  Location: [facility] ENDOSCOPY;  Service: Endoscopy;  Laterality: N/A;  . ESOPHAGOGASTRODUODENOSCOPY N/A 09/30/2014   Procedure: ESOPHAGOGASTRODUODENOSCOPY (EGD);  Surgeon: Robert Bellow, MD;  Location: [facility] ENDOSCOPY;  Service: Endoscopy;  Laterality: N/A;  . FOOT SURGERY Left 1985  . KNEE SURGERY  2016   Family Hx:  Family History  Problem Relation Age of Onset  . Diabetes Mother   . Hyperlipidemia Mother   . Heart disease Mother    Social Hx:   Social History   Tobacco Use  . Smoking status: Never Smoker  . Smokeless tobacco: Never Used  Substance Use Topics  . Alcohol use: No  . Drug use: No   Medication:    Current Outpatient Medications:  .  albuterol (PROVENTIL HFA;VENTOLIN HFA) 108 (90 Base) MCG/ACT inhaler, Inhale 2 puffs into the lungs every 6 (six) hours as needed for wheezing or shortness of breath., Disp: 1 Inhaler, Rfl:  11 .  budesonide (PULMICORT) 0.25 MG/2ML nebulizer solution, Take 2 mLs (0.25 mg total) by nebulization 2 (two) times daily., Disp: 60 mL, Rfl: 12 .  Calcium Carb-Cholecalciferol (CALCIUM 600 + D PO), Take 2 tablets by mouth daily., Disp: , Rfl:  .  citalopram (CELEXA) 20 MG tablet, Take 1 tablet (20 mg total) by mouth daily., Disp: 30 tablet, Rfl: 11 .  famotidine (PEPCID) 20 MG tablet, TAKE 1 TABLET BY MOUTH DAILY. BEFORE DINNER (Patient taking differently: Take 20 mg by mouth every evening. ), Disp: 30 tablet, Rfl: 11 .  fexofenadine (ALLEGRA) 180  MG tablet, Take 180 mg by mouth daily., Disp: , Rfl:  .  guaiFENesin-dextromethorphan (ROBITUSSIN DM) 100-10 MG/5ML syrup, Take 5 mLs by mouth every 4 (four) hours as needed for cough., Disp: 118 mL, Rfl: 0 .  ibuprofen (ADVIL,MOTRIN) 200 MG tablet, Take 600 mg by mouth daily as needed for moderate pain. , Disp: , Rfl:  .  ipratropium-albuterol (DUONEB) 0.5-2.5 (3) MG/3ML SOLN, Take 3 mLs by nebulization every 6 (six) hours as needed., Disp: 360 mL, Rfl: 2 .  levothyroxine (SYNTHROID, LEVOTHROID) 50 MCG tablet, TAKE 1 TABLET BY MOUTH DAILY (Patient taking differently: Take 50 mcg by mouth daily. ), Disp: 90 tablet, Rfl: 3 .  Omega-3 Fatty Acids (FISH OIL) 1200 MG CAPS, Take 1,200 mg by mouth daily. , Disp: , Rfl:  .  pantoprazole (PROTONIX) 40 MG tablet, TAKE 1 TABLET BY MOUTH DAILY. IN AM 30 MINUTES BEFORE EATING (Patient taking differently: Take 40 mg by mouth daily. ), Disp: 30 tablet, Rfl: 11 .  predniSONE (STERAPRED UNI-PAK 21 TAB) 10 MG (21) TBPK tablet, 6 tablets daily x 2,  Reduce by 1 tablet every 2 days until gone, Disp: 42 tablet, Rfl: 0   Allergies:  Patient has no known allergies.  Review of Systems: Gen:  Denies  fever, sweats, chills HEENT: Denies blurred vision, double vision. bleeds, sore throat Cvc:  No dizziness, chest pain. Resp:   Denies cough or sputum production, shortness of breath Gi: Denies swallowing difficulty, stomach pain. Gu:  Denies bladder incontinence, burning urine Ext:   No Joint pain, stiffness. Skin: No skin rash,  hives  Endoc:  No polyuria, polydipsia. Psych: No depression, insomnia. Other:  All other systems were reviewed with the patient and were negative other that what is mentioned in the HPI.   Physical Examination:   VS: BP (!) 148/80 (BP Location: Left Arm, Cuff Size: Normal)   Pulse 79   Ht 5\' 3"  (1.6 m)   Wt 188 lb (85.3 kg)   SpO2 97%   BMI 33.30 kg/m   General Appearance: No distress  Neuro:without focal findings,  speech normal,   HEENT: PERRLA, EOM intact.   Pulmonary: normal breath sounds, No wheezing.  CardiovascularNormal S1,S2.  No m/r/g.   Abdomen: Benign, Soft, non-tender. Renal:  No costovertebral tenderness  GU:  No performed at this time. Endoc: No evident thyromegaly, no signs of acromegaly. Skin:   warm, no rashes, no ecchymosis  Extremities: normal, no cyanosis, clubbing.  Other findings:    LABORATORY PANEL:   CBC No results for input(s): WBC, HGB, HCT, PLT in the last 168 hours. ------------------------------------------------------------------------------------------------------------------  Chemistries  No results for input(s): NA, K, CL, CO2, GLUCOSE, BUN, CREATININE, CALCIUM, MG, AST, ALT, ALKPHOS, BILITOT in the last 168 hours.  Invalid input(s): GFRCGP ------------------------------------------------------------------------------------------------------------------  Cardiac Enzymes No results for input(s): TROPONINI in the last 168 hours. ------------------------------------------------------------  RADIOLOGY:  No results found.  Thank  you for the consultation and for allowing [facility] to assist in the care of your patient. Our recommendations are noted above.  Please contact us if we can be of further service.   Marda Stalker, M.D., F.C.C.P.  Board Certified in Internal Medicine, Pulmonary Medicine, [place], and Sleep Medicine.  [facility] Office Number: [phone]   07/16/2018

## 2018-07-16 NOTE — Patient Instructions (Signed)
Will check lung function test in 6 months.  Continue to be active.

## 2018-07-18 ENCOUNTER — Other Ambulatory Visit: Payer: Self-pay

## 2018-07-18 ENCOUNTER — Other Ambulatory Visit: Payer: Self-pay | Admitting: Internal Medicine

## 2018-07-18 ENCOUNTER — Encounter
Admission: RE | Admit: 2018-07-18 | Discharge: 2018-07-18 | Disposition: A | Discharge status: Home / Self Care | Payer: Medicare Other | Source: Ambulatory Visit | Attending: Cardiovascular Disease | Admitting: Cardiovascular Disease

## 2018-07-18 DIAGNOSIS — R0789 Other chest pain: Secondary | ICD-10-CM | POA: Diagnosis not present

## 2018-07-18 DIAGNOSIS — R0602 Shortness of breath: Secondary | ICD-10-CM | POA: Diagnosis not present

## 2018-07-18 LAB — NM MYOCAR MULTI W/SPECT W/WALL MOTION / EF
Estimated workload: 1 METS
Exercise duration (min): 0 min
Exercise duration (sec): 0 s
LV dias vol: 39 mL (ref 46–106)
LV sys vol: 9 mL
MPHR: 151 {beats}/min
Peak HR: 104 {beats}/min
Percent HR: 68 %
Rest HR: 64 {beats}/min
SDS: 0
SRS: 0
SSS: 0
TID: 0.82

## 2018-07-18 MED ORDER — TECHNETIUM TC 99M TETROFOSMIN IV KIT
9.8000 | PACK | Freq: Once | INTRAVENOUS | Status: AC | PRN
  Administered 2018-07-18: 9.8 via INTRAVENOUS

## 2018-07-18 MED ORDER — TECHNETIUM TC 99M TETROFOSMIN IV KIT
32.9100 | PACK | Freq: Once | INTRAVENOUS | Status: AC | PRN
  Administered 2018-07-18: 32.91 via INTRAVENOUS

## 2018-07-18 MED ORDER — REGADENOSON 0.4 MG/5ML IV SOLN
0.4000 mg | Freq: Once | INTRAVENOUS | Status: AC
  Administered 2018-07-18: 0.4 mg via INTRAVENOUS

## 2018-07-26 ENCOUNTER — Other Ambulatory Visit: Payer: Self-pay

## 2018-07-26 ENCOUNTER — Ambulatory Visit (INDEPENDENT_AMBULATORY_CARE_PROVIDER_SITE_OTHER): Payer: Medicare Other

## 2018-07-26 DIAGNOSIS — R0789 Other chest pain: Secondary | ICD-10-CM

## 2018-07-26 DIAGNOSIS — R0602 Shortness of breath: Secondary | ICD-10-CM | POA: Diagnosis not present

## 2018-09-19 ENCOUNTER — Other Ambulatory Visit: Payer: Self-pay | Admitting: Internal Medicine

## 2018-09-19 DIAGNOSIS — Z1231 Encounter for screening mammogram for malignant neoplasm of breast: Secondary | ICD-10-CM

## 2018-10-11 ENCOUNTER — Other Ambulatory Visit: Payer: Self-pay

## 2018-10-11 ENCOUNTER — Encounter: Payer: Self-pay | Admitting: Internal Medicine

## 2018-10-11 ENCOUNTER — Ambulatory Visit (INDEPENDENT_AMBULATORY_CARE_PROVIDER_SITE_OTHER): Payer: Medicare Other | Admitting: Internal Medicine

## 2018-10-11 DIAGNOSIS — I119 Hypertensive heart disease without heart failure: Secondary | ICD-10-CM | POA: Diagnosis not present

## 2018-10-11 DIAGNOSIS — R030 Elevated blood-pressure reading, without diagnosis of hypertension: Secondary | ICD-10-CM | POA: Diagnosis not present

## 2018-10-11 DIAGNOSIS — R7303 Prediabetes: Secondary | ICD-10-CM

## 2018-10-11 DIAGNOSIS — R0602 Shortness of breath: Secondary | ICD-10-CM | POA: Diagnosis not present

## 2018-10-11 NOTE — Progress Notes (Signed)
Telephone Note  This visit type was conducted due to national recommendations for restrictions regarding the COVID-19 pandemic (e.g. social distancing).  This format is felt to be most appropriate for this patient at this time.  All issues noted in this document were discussed and addressed.  No physical exam was performed (except for noted visual exam findings with Video Visits).   I connected with@ on 10/11/18 at 10:00 AM EDT by  telephone and verified that I am speaking with the correct person using two identifiers. Location patient: home Location provider: work or home office Persons participating in the virtual visit: patient, provider  I discussed the limitations, risks, security and privacy concerns of performing an evaluation and management service by telephone and the availability of in person appointments. I also discussed with the patient that there may be a patient responsible charge related to this service. The patient expressed understanding and agreed to proceed.  Reason for visit: follow up on chronic conditions  HPI:  The patient has no signs or symptoms of COVID 19 infection (fever, cough, sore throat  or shortness of breath beyond what is typical for patient).  Patient denies contact with other persons with the above mentioned symptoms or with anyone confirmed to have COVID 19 .  She is masking and gloving if she ventures outside the home   Since her last visit she has been evaluated by cardiology for exertional dyspnea .  She underwent stress testing with a myoview and ECHO.  LVH was noted on recent ECHO :   Normal EF 88% . She has been walking for 15 minutes daily and SOB improving   She has been measuring her blood pressure with a wrist cuff last reading 119/72  Hypothyroid, TSH was normal in Dec   Hyperlipidemia: she has been managing with diet since December   Asthma:  She is now  seeing pulmonology afer an admission [facility] 3/5 to 3/8   prediabetes : a1c 5.8   An A1c  was done during her March 2020  admission when she had significant hyperglycemia during  treatment with steroids    ROS: See pertinent positives and negatives per HPI.  Past Medical History:  Diagnosis Date  . Arthritis   . Asthma   . Depression   . Dyspnea    with heat  . Environmental and seasonal allergies   . GERD (gastroesophageal reflux disease)   . Wheezing     Past Surgical History:  Procedure Laterality Date  . ABDOMINAL HYSTERECTOMY  1989  . CATARACT EXTRACTION W/PHACO Right 09/13/2016   Procedure: CATARACT EXTRACTION PHACO AND INTRAOCULAR LENS PLACEMENT (IOC);  Surgeon: Birder Robson, MD;  Location: [facility] ORS;  Service: Ophthalmology;  Laterality: Right;  [place] 00:38 AP% 14.3 CDE 5.51 Fluid pack lot # [id number]  . CATARACT EXTRACTION W/PHACO Left 10/11/2016   Procedure: CATARACT EXTRACTION PHACO AND INTRAOCULAR LENS PLACEMENT (IOC);  Surgeon: Birder Robson, MD;  Location: [facility] ORS;  Service: Ophthalmology;  Laterality: Left;  [place] 00:30 AP% 11.3 CDE 3.50 fluid pack lot # [id number] H  . COLONOSCOPY WITH PROPOFOL N/A 09/30/2014   Procedure: COLONOSCOPY WITH PROPOFOL;  Surgeon: Robert Bellow, MD;  Location: [facility] ENDOSCOPY;  Service: Endoscopy;  Laterality: N/A;  . ESOPHAGOGASTRODUODENOSCOPY N/A 09/30/2014   Procedure: ESOPHAGOGASTRODUODENOSCOPY (EGD);  Surgeon: Robert Bellow, MD;  Location: [facility] ENDOSCOPY;  Service: Endoscopy;  Laterality: N/A;  . FOOT SURGERY Left 1985  . KNEE SURGERY  2016    Family History  Problem Relation Age of Onset  .  Diabetes Mother   . Hyperlipidemia Mother   . Heart disease Mother     SOCIAL HX:  reports that she has never smoked. She has never used smokeless tobacco. She reports that she does not drink alcohol or use drugs.   Current Outpatient Medications:  .  albuterol (PROVENTIL HFA;VENTOLIN HFA) 108 (90 Base) MCG/ACT inhaler, Inhale 2 puffs into the lungs every 6 (six) hours as needed for wheezing or shortness of breath., Disp:  1 Inhaler, Rfl: 11 .  budesonide (PULMICORT) 0.25 MG/2ML nebulizer solution, Take 2 mLs (0.25 mg total) by nebulization 2 (two) times daily., Disp: 60 mL, Rfl: 12 .  Calcium Carb-Cholecalciferol (CALCIUM 600 + D PO), Take 2 tablets by mouth daily., Disp: , Rfl:  .  citalopram (CELEXA) 20 MG tablet, Take 1 tablet (20 mg total) by mouth daily., Disp: 30 tablet, Rfl: 11 .  famotidine (PEPCID) 20 MG tablet, TAKE 1 TABLET BY MOUTH DAILY. BEFORE DINNER (Patient taking differently: Take 20 mg by mouth every evening. ), Disp: 30 tablet, Rfl: 11 .  fexofenadine (ALLEGRA) 180 MG tablet, Take 180 mg by mouth daily., Disp: , Rfl:  .  ibuprofen (ADVIL,MOTRIN) 200 MG tablet, Take 600 mg by mouth daily as needed for moderate pain. , Disp: , Rfl:  .  ipratropium-albuterol (DUONEB) 0.5-2.5 (3) MG/3ML SOLN, Take 3 mLs by nebulization every 6 (six) hours as needed., Disp: 360 mL, Rfl: 2 .  levothyroxine (SYNTHROID, LEVOTHROID) 50 MCG tablet, TAKE 1 TABLET BY MOUTH DAILY (Patient taking differently: Take 50 mcg by mouth daily. ), Disp: 90 tablet, Rfl: 3 .  Omega-3 Fatty Acids (FISH OIL) 1200 MG CAPS, Take 1,200 mg by mouth daily. , Disp: , Rfl:  .  pantoprazole (PROTONIX) 40 MG tablet, TAKE 1 TABLET BY MOUTH EVERY MORNING, 30MINUTES BEFORE A MEAL, Disp: 30 tablet, Rfl: 11  EXAM:   General impression: alert, cooperative and articulate.  No signs of being in distress  Lungs: speech is fluent sentence length suggests that patient is not short of breath and not punctuated by cough, sneezing or sniffing. Marland Kitchen   Psych: affect normal.  speech is articulate and non pressured .  Denies suicidal thoughts    ASSESSMENT AND PLAN:  Prediabetes Her  random glucoses were elevated during her admission for asthma and an A1c was checker and suggests she is at risk for developing diabetes.  I recommend she follow a low glycemic index diet and particpate regularly in an aerobic  exercise activity.  We should check an A1c in 6 months.     SOB (shortness of breath) Secondary to asthma and deconditioning.  Stress test was negative. encouraged to continue her walking program and lose weight   LVH (left ventricular hypertrophy) due to hypertensive disease, without heart failure She has had several elevated readings but insists that her home readings are normal.  Taken with a wrist cuff.  Will have her return for an RN visit with home cuff for comparison ,  Needs BB  given LVH    Elevated blood pressure reading without diagnosis of hypertension She has no prior history of hypertension. She will check his blood pressure several times over the next 3-4 weeks and to submit readings for evaluation. Urine microalbumin to creatinine ratio done today is normal.  No results found for: MICROALBUR, [id number]    I discussed the assessment and treatment plan with the patient. The patient was provided an opportunity to ask questions and all were answered. The patient agreed with  the plan and demonstrated an understanding of the instructions.   The patient was advised to call back or seek an in-person evaluation if the symptoms worsen or if the condition fails to improve as anticipated.  I provided  25 minutes of non-face-to-face time during this encounter.   Crecencio Mc, MD

## 2018-10-13 DIAGNOSIS — I119 Hypertensive heart disease without heart failure: Secondary | ICD-10-CM | POA: Insufficient documentation

## 2018-10-13 DIAGNOSIS — R7303 Prediabetes: Secondary | ICD-10-CM | POA: Insufficient documentation

## 2018-10-13 NOTE — Assessment & Plan Note (Signed)
She has no prior history of hypertension. She will check his blood pressure several times over the next 3-4 weeks and to submit readings for evaluation. Urine microalbumin to creatinine ratio done today is normal.  No results found for: Sarah [name]

## 2018-10-13 NOTE — Assessment & Plan Note (Signed)
Her  random glucoses were elevated during her admission for asthma and an A1c was checker and suggests she is at risk for developing diabetes.  I recommend she follow a low glycemic index diet and particpate regularly in an aerobic  exercise activity.  We should check an A1c in 6 months.

## 2018-10-13 NOTE — Assessment & Plan Note (Signed)
Secondary to asthma and deconditioning.  Stress test was negative. encouraged to continue her walking program and lose weight

## 2018-10-13 NOTE — Assessment & Plan Note (Signed)
She has had several elevated readings but insists that her home readings are normal.  Taken with a wrist cuff.  Will have her return for an RN visit with home cuff for comparison ,  Needs BB  given LVH

## 2018-11-08 ENCOUNTER — Ambulatory Visit
Admission: RE | Admit: 2018-11-08 | Discharge: 2018-11-08 | Disposition: A | Discharge status: Home / Self Care | Payer: Medicare Other | Source: Ambulatory Visit | Attending: Internal Medicine | Admitting: Internal Medicine

## 2018-11-08 ENCOUNTER — Other Ambulatory Visit: Payer: Self-pay

## 2018-11-08 ENCOUNTER — Telehealth: Payer: Self-pay

## 2018-11-08 DIAGNOSIS — R7303 Prediabetes: Secondary | ICD-10-CM

## 2018-11-08 DIAGNOSIS — E785 Hyperlipidemia, unspecified: Secondary | ICD-10-CM

## 2018-11-08 DIAGNOSIS — Z1231 Encounter for screening mammogram for malignant neoplasm of breast: Secondary | ICD-10-CM | POA: Diagnosis not present

## 2018-11-08 DIAGNOSIS — R5383 Other fatigue: Secondary | ICD-10-CM

## 2018-11-08 DIAGNOSIS — I10 Essential (primary) hypertension: Secondary | ICD-10-CM

## 2018-11-08 DIAGNOSIS — E039 Hypothyroidism, unspecified: Secondary | ICD-10-CM

## 2018-11-08 NOTE — Telephone Encounter (Signed)
Copied from [place]. Topic: General - Inquiry >> Nov 08, 2018 11:33 AM Virl Axe D wrote: Reason for CRM: Pt called to schedule lab appt. Stated Dr. Derrel Nip told her someone would call her to schedule. No orders have been placed. Please advise.

## 2018-11-08 NOTE — Addendum Note (Signed)
Addended by: Adair Laundry on: 11/08/2018 01:42 PM   Modules accepted: Orders

## 2018-11-08 NOTE — Telephone Encounter (Signed)
Pt is scheduled for lab work in December. I have ordered CBC, TSH, CMP, A1c, and Lipid panel. Is there anything else that needs to be ordered?

## 2019-01-04 ENCOUNTER — Telehealth: Payer: Self-pay

## 2019-01-04 ENCOUNTER — Other Ambulatory Visit: Payer: Self-pay

## 2019-01-04 NOTE — Telephone Encounter (Signed)
Pt is aware of date/time of covid test and voiced her understanding.  Nothing further is needed.  

## 2019-01-07 ENCOUNTER — Other Ambulatory Visit: Payer: Self-pay

## 2019-01-07 ENCOUNTER — Other Ambulatory Visit
Admission: RE | Admit: 2019-01-07 | Discharge: 2019-01-07 | Disposition: A | Discharge status: Home / Self Care | Payer: Medicare Other | Source: Ambulatory Visit | Attending: Internal Medicine | Admitting: Internal Medicine

## 2019-01-07 DIAGNOSIS — Z20828 Contact with and (suspected) exposure to other viral communicable diseases: Secondary | ICD-10-CM | POA: Diagnosis not present

## 2019-01-07 DIAGNOSIS — Z01812 Encounter for preprocedural laboratory examination: Secondary | ICD-10-CM | POA: Insufficient documentation

## 2019-01-08 ENCOUNTER — Ambulatory Visit: Payer: Medicare Other | Attending: Internal Medicine

## 2019-01-08 DIAGNOSIS — J449 Chronic obstructive pulmonary disease, unspecified: Secondary | ICD-10-CM | POA: Insufficient documentation

## 2019-01-08 LAB — SARS CORONAVIRUS 2 (TAT 6-24 HRS): SARS Coronavirus 2: NEGATIVE

## 2019-01-08 MED ORDER — ALBUTEROL SULFATE (2.5 MG/3ML) 0.083% IN NEBU
2.5000 mg | INHALATION_SOLUTION | Freq: Once | RESPIRATORY_TRACT | Status: AC
  Administered 2019-01-08: 2.5 mg via RESPIRATORY_TRACT
  Filled 2019-01-08: qty 3

## 2019-02-08 ENCOUNTER — Telehealth: Payer: Self-pay

## 2019-02-08 NOTE — Telephone Encounter (Signed)
Copied from [place] [id number]. Topic: Quick Communication - Other Results (Clinic Use ONLY) >> Feb 08, 2019 11:00 AM Lennox Solders wrote: Pt is calling and she would like PFT results. Pt said dr Ashby Dawes is no longer with [facility] and she will need another pulmonologist

## 2019-02-11 ENCOUNTER — Telehealth: Payer: Self-pay | Admitting: Internal Medicine

## 2019-02-11 NOTE — Telephone Encounter (Signed)
Spoke with pt in regards to the PFT results and pt stated that she has an appt with Dr. Patsey Berthold on Thursday 02/14/2019.

## 2019-02-11 NOTE — Telephone Encounter (Signed)
Called and spoke pt, who is requesting PFT results.  Pt has been scheduled for 02/14/2019 with LG, to discuss results.  Nothing further is needed.

## 2019-02-11 NOTE — Telephone Encounter (Signed)
She reactive airways disease(asthma) that improved with albuterol.  So she should stay on the advair or symbicort as a preventive medication if she has had to use her albuterol at all in the last month   She should be set up with Dr Derrill Kay since dr Ashby Dawes left.  She needs to call their office to request her.  She should not need a referral

## 2019-02-14 ENCOUNTER — Ambulatory Visit (INDEPENDENT_AMBULATORY_CARE_PROVIDER_SITE_OTHER): Payer: Medicare Other | Admitting: Pulmonary Disease

## 2019-02-14 ENCOUNTER — Encounter: Payer: Self-pay | Admitting: Pulmonary Disease

## 2019-02-14 ENCOUNTER — Other Ambulatory Visit: Payer: Self-pay

## 2019-02-14 VITALS — BP 148/82 | HR 66 | Temp 97.7°F | Ht 63.0 in | Wt 173.2 lb

## 2019-02-14 DIAGNOSIS — R0602 Shortness of breath: Secondary | ICD-10-CM

## 2019-02-14 DIAGNOSIS — J4540 Moderate persistent asthma, uncomplicated: Secondary | ICD-10-CM

## 2019-02-14 DIAGNOSIS — K219 Gastro-esophageal reflux disease without esophagitis: Secondary | ICD-10-CM | POA: Diagnosis not present

## 2019-02-14 MED ORDER — BREO ELLIPTA 200-25 MCG/INH IN AEPB: 1.0000 | INHALATION_SPRAY | Freq: Every day | RESPIRATORY_TRACT | 0 refills | Status: AC

## 2019-02-14 NOTE — Progress Notes (Signed)
Subjective:    Patient ID: Sarah [name], female    DOB: 04-01-1951, 68 y.o.   MRN: [id number]  HPI Patient is a 68 year old lifelong never smoker who presents for follow-up after pulmonary function testing.  She had been previously evaluated by Dr. Ashby Dawes and I am now assuming care as Dr. Ashby Dawes has left the practice.  Patient had been evaluated by Dr. Ashby Dawes on 16 July 2018 after a hospitalization on 8 March for asthmatic exacerbation.  She was labeled as COPD at that time however it is noted the patient has never smoked and has never had occupational/environmental exposure.  Patient had pulmonary function testing performed on 08 January 2019.  I have reviewed Dr. Mathis Fare notes as well as her prior studies.  She had an echocardiogram in April that showed some diastolic dysfunction however normal ejection fraction.  Currently she has no overt symptomatology.  She does have some dyspnea on exertion that she has had for several years.  This has not worsened.  Also has issues with gastroesophageal reflux but appears to be well controlled.  She does not endorse any fevers, chills or sweats.  No chest pain, paroxysmal nocturnal dyspnea or orthopnea.  No lower extremity edema or calf tenderness.   Her current medication profile shows only an as-needed basis medications for asthma.  Patient is not on maintenance inhaler.   Review of Systems A 10 point review of systems was performed and it is as noted above otherwise negative.  No Known Allergies  Medications have been reviewed and are as noted.     Objective:   Physical Exam BP (!) 148/82 (BP Location: Left Arm, Cuff Size: Normal)   Pulse 66   Temp 97.7 F (36.5 C) (Temporal)   Ht 5\' 3"  (1.6 m)   Wt 173 lb 3.2 oz (78.6 kg)   SpO2 98%   BMI 30.68 kg/m    GENERAL: Overweight woman, no acute distress. HEAD: Normocephalic, atraumatic.  EYES: Pupils equal, round, reactive to light.  No scleral icterus.   MOUTH: Nose/mouth/throat not examined due to masking requirements for COVID 19. NECK: Supple. No thyromegaly. Trachea midline. No JVD.  No adenopathy. PULMONARY: Lungs sound coarse but otherwise clear to auscultation bilaterally. CARDIOVASCULAR: S1 and S2. Regular rate and rhythm.  No rubs, murmurs gallops heard. GASTROINTESTINAL: Benign. MUSCULOSKELETAL: No joint deformity, no clubbing, no edema.  NEUROLOGIC: No overt focal deficits. SKIN: Intact,warm,dry. PSYCH: Mood and behavior appropriate.       Assessment & Plan:     ICD-10-CM   1. Moderate persistent asthma without complication  123456    Patient does not have COPD History and PFTs more consistent with asthma Trial of Breo Ellipta 100/25, 1 puff daily  2. SOB (shortness of breath)  R06.02    Likely due to poorly compensated asthma Needs maintenance medication as above  3. Gastroesophageal reflux disease, unspecified whether esophagitis present  K21.9    Appears to be well controlled on her current regimen Antireflux measures reiterated   Discussion:  I have reviewed the patient's chart.  Though she was labeled as having COPD her history and pulmonary function testing is consistent with asthma.  Patient is a lifelong never smoker.  No significant secondhand exposure to cigarette smoke or significant occupational exposure.  She does however run the risk to develop airway remodeling if her asthma is not maintained.  Currently she is operating on "and as needed" inhaler therapy.  This is not optimal for asthma management.  She will  be given a trial of Breo Ellipta 100/25, 1 inhalation daily.  We will see her in follow-up in 3 months time she is to contact us prior to that time should any new difficulties arise.  Renold Don, MD [facility]   *This note was dictated using voice recognition software/Dragon.  Despite best efforts to proofread, errors can occur which can change the meaning.  Any change was purely  unintentional.

## 2019-02-14 NOTE — Patient Instructions (Signed)
1.  Your breathing tests show that you have ASTHMA that is mild to moderate.  You do not have COPD.  2.  We are going to give you a trial of Breo Ellipta 200/25, 1 inhalation daily.  Please rinse your mouth well after use.  If your breathing is improved with this medication please let us know and we will proceed with writing a prescription for it to your pharmacy.  The prescription will be for Breo Ellipta 100/25.  We do not have the lower dose Breo today for sampling.  3.  We will see you in follow-up in 3 months time call us sooner should any new difficulties arise.

## 2019-02-14 NOTE — Progress Notes (Deleted)
   Subjective:    Patient ID: Sarah [name], female    DOB: June 06, 1950, 68 y.o.   MRN: [id number]  HPI    Review of Systems     Objective:   Physical Exam        Assessment & Plan:

## 2019-02-26 ENCOUNTER — Telehealth: Payer: Self-pay | Admitting: Pulmonary Disease

## 2019-02-26 MED ORDER — BREO ELLIPTA 100-25 MCG/INH IN AEPB: 1.0000 | INHALATION_SPRAY | Freq: Every day | RESPIRATORY_TRACT | 3 refills | Status: AC

## 2019-02-26 NOTE — Telephone Encounter (Signed)
Rx for Breo 100 has been sent to preferred pharmacy. Pt is aware and voiced her understanding.  Nothing further is needed.

## 2019-02-27 ENCOUNTER — Telehealth: Payer: Self-pay | Admitting: Pulmonary Disease

## 2019-02-27 NOTE — Telephone Encounter (Signed)
Patient states would like samples of Breo inhaler.  Patient phone number is [phone].

## 2019-02-27 NOTE — Telephone Encounter (Signed)
Spoke to pt, who is requesting cheaper alternative for Breo.  I have advised pt to contact insurance company and request cheaper alternatives.  Pt stated that she would call back with update.

## 2019-02-27 NOTE — Telephone Encounter (Signed)
Called and spoke to pt, who is requesting samples of Breo to last her until January.  Pt is aware that can not provide her with that amount of samples, as our supply is limited. Pt stated per insurance once she meets her deductible, [employer] co pay will only be 32 dollars.  Pt stated that she may have to wait until the first of the year to start [facility].   Will route to LG just as an FYI.

## 2019-02-28 ENCOUNTER — Telehealth: Payer: Self-pay

## 2019-02-28 ENCOUNTER — Other Ambulatory Visit: Payer: Self-pay | Admitting: Internal Medicine

## 2019-02-28 NOTE — Telephone Encounter (Signed)
Copied from [place] [id number]. Topic: General - Other >> Feb 28, 2019  3:55 PM Rainey Pines A wrote: Patient would like a callback from nurse in regards to if the office has any samples of Breo Ellipta. Please advise

## 2019-02-28 NOTE — Telephone Encounter (Signed)
Noted  

## 2019-03-07 ENCOUNTER — Telehealth: Payer: Self-pay | Admitting: Pulmonary Disease

## 2019-03-07 NOTE — Telephone Encounter (Signed)
Spoke to pt, who is requesting Breo samples to last her until Dec.  I have made pt aware that we are limited on our samples, and we do not have enough supply to do so.   LG please advise if okay to give one sample of Breo? Thanks

## 2019-03-08 NOTE — Telephone Encounter (Signed)
Pt is aware of below message and voiced her understanding.  Pt stated that she will call back in the next week or so to see if we have gotten samples in. Nothing further is needed.

## 2019-03-08 NOTE — Telephone Encounter (Signed)
Unfortunately we do not have samples at this time

## 2019-03-11 NOTE — Telephone Encounter (Signed)
Patient notified no Brio samples.

## 2019-04-08 ENCOUNTER — Other Ambulatory Visit: Payer: Self-pay

## 2019-04-08 ENCOUNTER — Other Ambulatory Visit (INDEPENDENT_AMBULATORY_CARE_PROVIDER_SITE_OTHER): Payer: Medicare Other

## 2019-04-08 DIAGNOSIS — R5383 Other fatigue: Secondary | ICD-10-CM | POA: Diagnosis not present

## 2019-04-08 DIAGNOSIS — R7303 Prediabetes: Secondary | ICD-10-CM | POA: Diagnosis not present

## 2019-04-08 DIAGNOSIS — I10 Essential (primary) hypertension: Secondary | ICD-10-CM

## 2019-04-08 DIAGNOSIS — E039 Hypothyroidism, unspecified: Secondary | ICD-10-CM

## 2019-04-08 DIAGNOSIS — E785 Hyperlipidemia, unspecified: Secondary | ICD-10-CM

## 2019-04-08 LAB — CBC WITH DIFFERENTIAL/PLATELET
Basophils Absolute: 0.1 10*3/uL (ref 0.0–0.1)
Basophils Relative: 1.5 % (ref 0.0–3.0)
Eosinophils Absolute: 0.3 10*3/uL (ref 0.0–0.7)
Eosinophils Relative: 5 % (ref 0.0–5.0)
HCT: 36.6 % (ref 36.0–46.0)
Hemoglobin: 12.2 g/dL (ref 12.0–15.0)
Lymphocytes Relative: 29.6 % (ref 12.0–46.0)
Lymphs Abs: 1.9 10*3/uL (ref 0.7–4.0)
MCHC: 33.3 g/dL (ref 30.0–36.0)
MCV: 100.1 fl — ABNORMAL HIGH (ref 78.0–100.0)
Monocytes Absolute: 0.4 10*3/uL (ref 0.1–1.0)
Monocytes Relative: 6.9 % (ref 3.0–12.0)
Neutro Abs: 3.6 10*3/uL (ref 1.4–7.7)
Neutrophils Relative %: 57 % (ref 43.0–77.0)
Platelets: 174 10*3/uL (ref 150.0–400.0)
RBC: 3.65 Mil/uL — ABNORMAL LOW (ref 3.87–5.11)
RDW: 13.4 % (ref 11.5–15.5)
WBC: 6.3 10*3/uL (ref 4.0–10.5)

## 2019-04-08 LAB — TSH: TSH: 2.91 u[IU]/mL (ref 0.35–4.50)

## 2019-04-08 LAB — COMPREHENSIVE METABOLIC PANEL
ALT: 14 U/L (ref 0–35)
AST: 11 U/L (ref 0–37)
Albumin: 4.2 g/dL (ref 3.5–5.2)
Alkaline Phosphatase: 69 U/L (ref 39–117)
BUN: 20 mg/dL (ref 6–23)
CO2: 31 mEq/L (ref 19–32)
Calcium: 9.4 mg/dL (ref 8.4–10.5)
Chloride: 105 mEq/L (ref 96–112)
Creatinine, Ser: 0.88 mg/dL (ref 0.40–1.20)
GFR: 63.57 mL/min (ref >60.00)
Glucose, Bld: 107 mg/dL — ABNORMAL HIGH (ref 70–99)
Potassium: 3.9 mEq/L (ref 3.5–5.1)
Sodium: 142 mEq/L (ref 135–145)
Total Bilirubin: 0.5 mg/dL (ref 0.2–1.2)
Total Protein: 6.9 g/dL (ref 6.0–8.3)

## 2019-04-08 LAB — LIPID PANEL
Cholesterol: 232 mg/dL — ABNORMAL HIGH (ref 0–200)
HDL: 63.6 mg/dL (ref >39.00)
LDL Cholesterol: 157 mg/dL — ABNORMAL HIGH (ref 0–99)
NonHDL: 167.97
Total CHOL/HDL Ratio: 4
Triglycerides: 53 mg/dL (ref 0.0–149.0)
VLDL: 10.6 mg/dL (ref 0.0–40.0)

## 2019-04-08 LAB — HEMOGLOBIN A1C: Hgb A1c MFr Bld: 5.9 % (ref 4.6–6.5)

## 2019-04-10 ENCOUNTER — Ambulatory Visit: Payer: Medicare Other

## 2019-04-10 ENCOUNTER — Ambulatory Visit (INDEPENDENT_AMBULATORY_CARE_PROVIDER_SITE_OTHER): Payer: Medicare Other | Admitting: Internal Medicine

## 2019-04-10 ENCOUNTER — Other Ambulatory Visit: Payer: Self-pay

## 2019-04-10 ENCOUNTER — Encounter: Payer: Self-pay | Admitting: Internal Medicine

## 2019-04-10 DIAGNOSIS — Z7189 Other specified counseling: Secondary | ICD-10-CM | POA: Diagnosis not present

## 2019-04-10 DIAGNOSIS — J4540 Moderate persistent asthma, uncomplicated: Secondary | ICD-10-CM | POA: Diagnosis not present

## 2019-04-10 DIAGNOSIS — E039 Hypothyroidism, unspecified: Secondary | ICD-10-CM | POA: Diagnosis not present

## 2019-04-10 DIAGNOSIS — R7303 Prediabetes: Secondary | ICD-10-CM

## 2019-04-10 DIAGNOSIS — E785 Hyperlipidemia, unspecified: Secondary | ICD-10-CM

## 2019-04-10 MED ORDER — LEVOTHYROXINE SODIUM 50 MCG PO TABS: 50.0000 ug | ORAL_TABLET | Freq: Every day | ORAL | 1 refills | Status: DC

## 2019-04-10 NOTE — Assessment & Plan Note (Signed)
Advised to curtail eating out currently given the increase in cases and the lack of social distancing between tables.  Also corrected her use of masks.  Not changing masks with each foray into grocery store/restaurant.

## 2019-04-10 NOTE — Assessment & Plan Note (Signed)
Thyroid function is WNL on current dose.  No current changes needed.    Lab Results  Component Value Date   TSH 2.91 04/08/2019

## 2019-04-10 NOTE — Assessment & Plan Note (Addendum)
Her A1c I rising and suggests she is at risk for developing diabetes.  I recommend she follow a low glycemic index diet and particpate regularly in an aerobic  exercise activity.  We should check an A1c in 6 months.   Lab Results  Component Value Date   HGBA1C 5.9 04/08/2019

## 2019-04-10 NOTE — Progress Notes (Signed)
Virtual Visit converted to telephone  Note  This visit type was conducted due to national recommendations for restrictions regarding the COVID-19 pandemic (e.g. social distancing).  This format is felt to be most appropriate for this patient at this time.  All issues noted in this document were discussed and addressed.  No physical exam was performed (except for noted visual exam findings with Video Visits).   I connected with@ on 04/10/19 at 10:00 AM EST by a video enabled telemedicine application.  Interactive audio and video telecommunications were initially established beteen this provider and patient, however ultimately failed, due to patient having technical difficulties. We continued and completed visit with audio only  and verified that I am speaking with the correct person using two identifiers  Location patient: home Location provider: work or home office Persons participating in the virtual visit: patient, provider  I discussed the limitations, risks, security and privacy concerns of performing an evaluation and management service by telephone and the availability of in person appointments. I also discussed with the patient that there may be a patient responsible charge related to this service. The patient expressed understanding and agreed to proceed.  Reason for visit: follow up on asthma hypothyroidisi and prediabetes  HPI:  Asthma: has not had to use inhaler in several months.  Was  Given a 14 day sample of Breo by pulmonology,  Felt the best with that medication but the cost is $215 .  Changing insurance  Plan for 2021 with [facility] and cost should drop to $37   The patient has no signs or symptoms of COVID 19 infection (fever, cough, sore throat  or shortness of breath beyond what is typical for patient).  Patient denies contact with other persons with the above mentioned symptoms or with anyone confirmed to have COVID 19 .   Using the COVID  19 app on her phone to warn her of  any contacts .  None so far.  But she eats out at restaurants 2-3 ties per week and not all are socially distancing the tables.   Not walking daily since she ran out of breva .  Discussed prediabetes diagnosis,  Not exercising regularly .    ROS: See pertinent positives and negatives per HPI.  Past Medical History:  Diagnosis Date  . Arthritis   . Asthma   . Depression   . Dyspnea    with heat  . Environmental and seasonal allergies   . GERD (gastroesophageal reflux disease)   . Wheezing     Past Surgical History:  Procedure Laterality Date  . ABDOMINAL HYSTERECTOMY  1989  . CATARACT EXTRACTION W/PHACO Right 09/13/2016   Procedure: CATARACT EXTRACTION PHACO AND INTRAOCULAR LENS PLACEMENT (IOC);  Surgeon: Birder Robson, MD;  Location: [facility] ORS;  Service: Ophthalmology;  Laterality: Right;  [place] 00:38 AP% 14.3 CDE 5.51 Fluid pack lot # [id number]  . CATARACT EXTRACTION W/PHACO Left 10/11/2016   Procedure: CATARACT EXTRACTION PHACO AND INTRAOCULAR LENS PLACEMENT (IOC);  Surgeon: Birder Robson, MD;  Location: [facility] ORS;  Service: Ophthalmology;  Laterality: Left;  [place] 00:30 AP% 11.3 CDE 3.50 fluid pack lot # [id number]  . COLONOSCOPY WITH PROPOFOL N/A 09/30/2014   Procedure: COLONOSCOPY WITH PROPOFOL;  Surgeon: Robert Bellow, MD;  Location: [facility] ENDOSCOPY;  Service: Endoscopy;  Laterality: N/A;  . ESOPHAGOGASTRODUODENOSCOPY N/A 09/30/2014   Procedure: ESOPHAGOGASTRODUODENOSCOPY (EGD);  Surgeon: Robert Bellow, MD;  Location: [facility] ENDOSCOPY;  Service: Endoscopy;  Laterality: N/A;  . FOOT SURGERY Left 1985  .  KNEE SURGERY  2016    Family History  Problem Relation Age of Onset  . Diabetes Mother   . Hyperlipidemia Mother   . Heart disease Mother     SOCIAL HX:  reports that she has never smoked. She has never used smokeless tobacco. She reports that she does not drink alcohol or use drugs.   Current Outpatient Medications:  .  albuterol (PROVENTIL HFA;VENTOLIN HFA) 108  (90 Base) MCG/ACT inhaler, Inhale 2 puffs into the lungs every 6 (six) hours as needed for wheezing or shortness of breath., Disp: 1 Inhaler, Rfl: 11 .  budesonide (PULMICORT) 0.25 MG/2ML nebulizer solution, Take 2 mLs (0.25 mg total) by nebulization 2 (two) times daily., Disp: 60 mL, Rfl: 12 .  Calcium Carb-Cholecalciferol (CALCIUM 600 + D PO), Take 2 tablets by mouth daily., Disp: , Rfl:  .  citalopram (CELEXA) 20 MG tablet, TAKE ONE TABLET BY MOUTH EVERY DAY, Disp: 30 tablet, Rfl: 4 .  famotidine (PEPCID) 20 MG tablet, TAKE 1 TABLET BY MOUTH DAILY. BEFORE DINNER (Patient taking differently: Take 20 mg by mouth every evening. ), Disp: 30 tablet, Rfl: 11 .  fexofenadine (ALLEGRA) 180 MG tablet, Take 180 mg by mouth daily., Disp: , Rfl:  .  ibuprofen (ADVIL,MOTRIN) 200 MG tablet, Take 600 mg by mouth daily as needed for moderate pain. , Disp: , Rfl:  .  ipratropium-albuterol (DUONEB) 0.5-2.5 (3) MG/3ML SOLN, Take 3 mLs by nebulization every 6 (six) hours as needed., Disp: 360 mL, Rfl: 2 .  levothyroxine (SYNTHROID) 50 MCG tablet, Take 1 tablet (50 mcg total) by mouth daily., Disp: 90 tablet, Rfl: 1 .  Omega-3 Fatty Acids (FISH OIL) 1200 MG CAPS, Take 1,200 mg by mouth daily. , Disp: , Rfl:  .  pantoprazole (PROTONIX) 40 MG tablet, TAKE 1 TABLET BY MOUTH EVERY MORNING, 30MINUTES BEFORE A MEAL, Disp: 30 tablet, Rfl: 11 .  BREO ELLIPTA 100-25 MCG/INH AEPB, Inhale 1 puff into the lungs daily., Disp: , Rfl:   EXAM:   General impression: alert, cooperative and articulate.  No signs of being in distress  Lungs: speech is fluent sentence length suggests that patient is not short of breath and not punctuated by cough, sneezing or sniffing. Marland Kitchen   Psych: affect normal.  speech is articulate and non pressured .  Denies suicidal thoughts  ASSESSMENT AND PLAN:  Discussed the following assessment and plan:  Educated about COVID-19 virus infection  Moderate persistent asthma without complication  Acquired  hypothyroidism  Prediabetes  Hyperlipidemia, unspecified hyperlipidemia type  Educated about COVID-19 virus infection Advised to curtail eating out currently given the increase in cases and the lack of social distancing between tables.  Also corrected her use of masks.  Not changing masks with each foray into grocery store/restaurant.   Asthma Cannot afford Breo until Jan 1.  Advised to resume nebulized pulmicort and Duoneb  Bid and start walking   Hypothyroid Thyroid function is WNL on current dose.  No current changes needed.    Lab Results  Component Value Date   TSH 2.91 04/08/2019     Prediabetes Her A1c I rising and suggests she is at risk for developing diabetes.  I recommend she follow a low glycemic index diet and particpate regularly in an aerobic  exercise activity.  We should check an A1c in 6 months.   Lab Results  Component Value Date   HGBA1C 5.9 04/08/2019      Hyperlipidemia She did not tolerate trial of red yeast  rice.  10 yr risk of CAD is  Still low by  today's calculation but risk is increased if she develops diabetes  Lab Results  Component Value Date   CHOL 232 (H) 04/08/2019   HDL 63.60 04/08/2019   LDLCALC 157 (H) 04/08/2019   TRIG 53.0 04/08/2019   CHOLHDL 4 04/08/2019         I discussed the assessment and treatment plan with the patient. The patient was provided an opportunity to ask questions and all were answered. The patient agreed with the plan and demonstrated an understanding of the instructions.   The patient was advised to call back or seek an in-person evaluation if the symptoms worsen or if the condition fails to improve as anticipated.  I provided  25 minutes of non-face-to-face time during this encounter reviewing patient's current problems and past procedures/imaging studies, providing counseling on the above mentioned problems , and coordination  of care .  Crecencio Mc, MD

## 2019-04-10 NOTE — Assessment & Plan Note (Signed)
Cannot afford Breo until Jan 1.  Advised to resume nebulized pulmicort and Duoneb  Bid and start walking

## 2019-04-10 NOTE — Assessment & Plan Note (Signed)
She did not tolerate trial of red yeast rice.  10 yr risk of CAD is  Still low by  today's calculation but risk is increased if she develops diabetes  Lab Results  Component Value Date   CHOL 232 (H) 04/08/2019   HDL 63.60 04/08/2019   LDLCALC 157 (H) 04/08/2019   TRIG 53.0 04/08/2019   CHOLHDL 4 04/08/2019

## 2019-04-11 ENCOUNTER — Ambulatory Visit (INDEPENDENT_AMBULATORY_CARE_PROVIDER_SITE_OTHER): Payer: Medicare Other

## 2019-04-11 VITALS — Ht 63.0 in | Wt 173.0 lb

## 2019-04-11 DIAGNOSIS — Z0000 Encounter for general adult medical examination without abnormal findings: Secondary | ICD-10-CM | POA: Diagnosis not present

## 2019-04-11 NOTE — Progress Notes (Signed)
Subjective:   Sarah [name] is a 68 y.o. female who presents for Medicare Annual (Subsequent) preventive examination.  Review of Systems:  No ROS.  Medicare Wellness Virtual Visit.  Visual/audio telehealth visit, UTA vital signs.   Ht/wt transferred from previous visit. See social history for additional risk factors.   Cardiac Risk Factors include: advanced age (>83men, >39 women)     Objective:     Vitals: Ht 5\' 3"  (1.6 m)   Wt 173 lb (78.5 kg)   BMI 30.65 kg/m   Body mass index is 30.65 kg/m.  Advanced Directives 04/11/2019 06/28/2018 06/28/2018 04/09/2018 10/11/2016 09/13/2016 09/01/2014  Does Patient Have a Medical Advance Directive? Yes Yes No Yes No No No  Type of Paramedic of [place];Living will Healthcare Power of [place];Living will - - -  Does patient want to make changes to medical advance directive? No - Patient declined No - Patient declined - No - Patient declined - - -  Copy of [place] in Chart? No - copy requested No - copy requested - No - copy requested - - -  Would patient like information on creating a medical advance directive? - No - Patient declined No - Patient declined - No - Patient declined - Yes - Scientist, clinical (histocompatibility and immunogenetics) given    Tobacco Social History   Tobacco Use  Smoking Status Never Smoker  Smokeless Tobacco Never Used     Counseling given: Not Answered   Clinical Intake:  Pre-visit preparation completed: Yes        Diabetes: No  How often do you need to have someone help you when you read instructions, pamphlets, or other written materials from your doctor or pharmacy?: 1 - Never  Interpreter Needed?: No     Past Medical History:  Diagnosis Date  . Arthritis   . Asthma   . Depression   . Dyspnea    with heat  . Environmental and seasonal allergies   . GERD (gastroesophageal reflux disease)   . Wheezing    Past Surgical History:  Procedure  Laterality Date  . ABDOMINAL HYSTERECTOMY  1989  . CATARACT EXTRACTION W/PHACO Right 09/13/2016   Procedure: CATARACT EXTRACTION PHACO AND INTRAOCULAR LENS PLACEMENT (IOC);  Surgeon: Birder Robson, MD;  Location: [facility] ORS;  Service: Ophthalmology;  Laterality: Right;  [place] 00:38 AP% 14.3 CDE 5.51 Fluid pack lot # [id number]  . CATARACT EXTRACTION W/PHACO Left 10/11/2016   Procedure: CATARACT EXTRACTION PHACO AND INTRAOCULAR LENS PLACEMENT (IOC);  Surgeon: Birder Robson, MD;  Location: [facility] ORS;  Service: Ophthalmology;  Laterality: Left;  [place] 00:30 AP% 11.3 CDE 3.50 fluid pack lot # [id number]  . COLONOSCOPY WITH PROPOFOL N/A 09/30/2014   Procedure: COLONOSCOPY WITH PROPOFOL;  Surgeon: Robert Bellow, MD;  Location: [facility] ENDOSCOPY;  Service: Endoscopy;  Laterality: N/A;  . ESOPHAGOGASTRODUODENOSCOPY N/A 09/30/2014   Procedure: ESOPHAGOGASTRODUODENOSCOPY (EGD);  Surgeon: Robert Bellow, MD;  Location: [facility] ENDOSCOPY;  Service: Endoscopy;  Laterality: N/A;  . FOOT SURGERY Left 1985  . KNEE SURGERY  2016   Family History  Problem Relation Age of Onset  . Diabetes Mother   . Hyperlipidemia Mother   . Heart disease Mother    Social History   Socioeconomic History  . Marital status: Single    Spouse name: Not on file  . Number of children: Not on file  . Years of education: Not on file  . Highest education level: Not on file  Occupational History  . Not on file  Tobacco Use  . Smoking status: Never Smoker  . Smokeless tobacco: Never Used  Substance and Sexual Activity  . Alcohol use: No  . Drug use: No  . Sexual activity: Never  Other Topics Concern  . Not on file  Social History Narrative  . Not on file   Social Determinants of Health   Financial Resource Strain:   . Difficulty of Paying Living Expenses: Not on file  Food Insecurity:   . Worried About Charity fundraiser in the Last Year: Not on file  . Ran Out of Food in the Last Year: Not on file  Transportation  Needs:   . Lack of Transportation (Medical): Not on file  . Lack of Transportation (Non-Medical): Not on file  Physical Activity:   . Days of Exercise per Week: Not on file  . Minutes of Exercise per Session: Not on file  Stress:   . Feeling of Stress : Not on file  Social Connections:   . Frequency of Communication with Friends and Family: Not on file  . Frequency of Social Gatherings with Friends and Family: Not on file  . Attends Religious Services: Not on file  . Active Member of Clubs or Organizations: Not on file  . Attends Archivist Meetings: Not on file  . Marital Status: Not on file    Outpatient Encounter Medications as of 04/11/2019  Medication Sig  . albuterol (PROVENTIL HFA;VENTOLIN HFA) 108 (90 Base) MCG/ACT inhaler Inhale 2 puffs into the lungs every 6 (six) hours as needed for wheezing or shortness of breath.  Marland Kitchen ascorbic acid (VITAMIN C) 1000 MG tablet Take by mouth.  Marland Kitchen BREO ELLIPTA 100-25 MCG/INH AEPB Inhale 1 puff into the lungs daily.  . budesonide (PULMICORT) 0.25 MG/2ML nebulizer solution Take 2 mLs (0.25 mg total) by nebulization 2 (two) times daily.  . Calcium Carb-Cholecalciferol (CALCIUM 600 + D PO) Take 2 tablets by mouth daily.  . citalopram (CELEXA) 20 MG tablet TAKE ONE TABLET BY MOUTH EVERY DAY  . famotidine (PEPCID) 20 MG tablet TAKE 1 TABLET BY MOUTH DAILY. BEFORE DINNER (Patient taking differently: Take 20 mg by mouth every evening. )  . fexofenadine (ALLEGRA) 180 MG tablet Take 180 mg by mouth daily.  Marland Kitchen ibuprofen (ADVIL,MOTRIN) 200 MG tablet Take 600 mg by mouth daily as needed for moderate pain.   Marland Kitchen ipratropium-albuterol (DUONEB) 0.5-2.5 (3) MG/3ML SOLN Take 3 mLs by nebulization every 6 (six) hours as needed.  Marland Kitchen levothyroxine (SYNTHROID) 50 MCG tablet Take 1 tablet (50 mcg total) by mouth daily.  . Omega-3 Fatty Acids (FISH OIL) 1200 MG CAPS Take 1,200 mg by mouth daily.   . pantoprazole (PROTONIX) 40 MG tablet TAKE 1 TABLET BY MOUTH EVERY  MORNING, 30MINUTES BEFORE A MEAL   No facility-administered encounter medications on file as of 04/11/2019.    Activities of Daily Living In your present state of health, do you have any difficulty performing the following activities: 04/11/2019 06/28/2018  Hearing? N N  Vision? N N  Difficulty concentrating or making decisions? N N  Walking or climbing stairs? N N  Dressing or bathing? N N  Doing errands, shopping? N N  Preparing Food and eating ? N -  Using the Toilet? N -  In the past six months, have you accidently leaked urine? N -  Do you have problems with loss of bowel control? N -  Managing your Medications? N -  Managing  your Finances? N -  Housekeeping or managing your Housekeeping? N -  Some recent data might be hidden    Patient Care Team: Crecencio Mc, MD as PCP - General (Internal Medicine) Crecencio Mc, MD (Internal Medicine) Bary Castilla Forest Gleason, MD (General Surgery)    Assessment:   This is a routine wellness examination for Sarah [name].  Nurse connected with patient 04/11/19 at 11:00 AM EST by a telephone enabled telemedicine application and verified that I am speaking with the correct person using two identifiers. Patient stated full name and DOB. Patient gave permission to continue with virtual visit. Patient's location was at home and Nurse's location was at [place] office.   Patient is alert and oriented x3. Patient notes difficulty focusing, remembering or concentrating.  Correct recall address given 5/5, stated months in reverse and counted backwards from 20-0. Patient likes to play with her kitten, play solitaire and complete puzzles for brain stimulation.   Health Maintenance Due: See completed HM at the end of note.   Eye: Visual acuity not assessed. Virtual visit. Followed by their ophthalmologist.  Dental: UTD   Hearing: Demonstrates normal hearing during visit.  Safety:  Patient feels safe at home- yes Patient does have smoke detectors  at home- yes Patient does wear sunscreen or protective clothing when in direct sunlight - yes Patient does wear seat belt when in a moving vehicle - yes Patient drives- yes Adequate lighting in walkways free from debris- yes Grab bars and handrails used as appropriate- yes Ambulates with an assistive device- no Cell phone on person when ambulating outside of the home- yes  Social: Alcohol intake - no       Smoking history- never   Smokers in home? none Illicit drug use? none  Medication: Taking as directed and without issues.  Pill box in use -yes  Self managed - yes   Covid-19: Precautions and sickness symptoms discussed. Wears mask, social distancing, hand hygiene as appropriate.   Activities of Daily Living Patient denies needing assistance with: household chores, feeding themselves, getting from bed to chair, getting to the toilet, bathing/showering, dressing, managing money, or preparing meals.   Discussed the importance of a healthy diet, water intake and the benefits of aerobic exercise.   Physical activity- no routine. Encouraged to walk for exercise.   Diet:  Regular Water: poor intake Other fluids: diet 7up  Other Providers Patient Care Team: Crecencio Mc, MD as PCP - General (Internal Medicine) Crecencio Mc, MD (Internal Medicine) Bary Castilla Forest Gleason, MD (General Surgery)  Exercise Activities and Dietary recommendations Current Exercise Habits: The patient does not participate in regular exercise at present  Goals      Patient Stated   . DIET - INCREASE WATER INTAKE (pt-stated)    . DIET - REDUCE SUGAR INTAKE (pt-stated)     Low Carb Diet Portion control Healthy choices    . Increase physical activity (pt-stated)     Walk for exercise       Fall Risk Fall Risk  04/11/2019 04/10/2019 04/06/2018 04/22/2016 09/01/2014  Falls in the past year? 0 0 0 No Yes  Number falls in past yr: 0 0 - - 1  Comment - - - - fell in sept 2015  Injury with Fall?  0 0 - - Yes  Comment - - - - Had to have knee surgery   Risk for fall due to : - - - - Impaired balance/gait  Follow up Falls prevention discussed Falls evaluation  completed - - Falls evaluation completed   Timed Get Up and Go performed: no, virtual visit.  Depression Screen PHQ 2/9 Scores 04/11/2019 04/09/2018 06/05/2017 04/22/2016  PHQ - 2 Score 0 1 0 0  PHQ- 9 Score - - 0 -     Cognitive Function MMSE - Mini Mental State Exam 04/09/2018 09/01/2014  Orientation to time 4 5  Orientation to time comments Difficulty drawing clock face with time.  -  Orientation to Place 5 5  Registration 3 3  Attention/ Calculation 2 5  Attention/Calculation-comments Difficulty with calculation -  Recall 1 3  Recall-comments 1 out of 3 recall -  Language- name 2 objects 2 2  Language- repeat 1 1  Language- follow 3 step command 3 3  Language- read & follow direction 1 1  Write a sentence 0 1  Write a sentence-comments Difficulty completing sentence.  -  Copy design 1 1  Total score 23 30     6CIT Screen 04/11/2019  What Year? 0 points  What month? 0 points  What time? 0 points  Count back from 20 0 points  Months in reverse 0 points  Repeat phrase 0 points  Total Score 0    Immunization History  Administered Date(s) Administered  . Fluad Quad(high Dose 65+) 12/06/2018  . Influenza, High Dose Seasonal PF 04/22/2016, 06/05/2017  . Influenza,inj,Quad PF,6+ Mos 04/26/2013, 01/30/2015  . Influenza-Unspecified 01/24/2018, 12/06/2018  . Pneumococcal Conjugate-13 09/01/2014, 01/24/2018  . Pneumococcal Polysaccharide-23 04/26/2013, 02/28/2019  . Tdap 06/18/2014  . Zoster Recombinat (Shingrix) 07/08/2017, 09/22/2017   Screening Tests Health Maintenance  Topic Date Due  . MAMMOGRAM  11/07/2020  . TETANUS/TDAP  06/18/2024  . COLONOSCOPY  09/29/2024  . INFLUENZA VACCINE  Completed  . DEXA SCAN  Completed  . Hepatitis C Screening  Completed  . PNA vac Low Risk Adult  Completed        Plan:   Keep all routine maintenance appointments.   Medicare Attestation I have personally reviewed: The patient's medical and social history Their use of alcohol, tobacco or illicit drugs Their current medications and supplements The patient's functional ability including ADLs,fall risks, home safety risks, cognitive, and hearing and visual impairment Diet and physical activities Evidence for depression   I have reviewed and discussed with patient certain preventive protocols, quality metrics, and best practice recommendations.   Varney Biles, LPN  [phone]

## 2019-04-11 NOTE — Patient Instructions (Addendum)
  Sarah [name] , Thank you for taking time to come for your Medicare Wellness Visit. I appreciate your ongoing commitment to your health goals. Please review the following plan we discussed and let me know if I can assist you in the future.   These are the goals we discussed: Goals      Patient Stated   . DIET - INCREASE WATER INTAKE (pt-stated)    . DIET - REDUCE SUGAR INTAKE (pt-stated)     Low Carb Diet Portion control Healthy choices    . Increase physical activity (pt-stated)     Walk for exercise       This is a list of the screening recommended for you and due dates:  Health Maintenance  Topic Date Due  . Mammogram  11/07/2020  . Tetanus Vaccine  06/18/2024  . Colon Cancer Screening  09/29/2024  . Flu Shot  Completed  . DEXA scan (bone density measurement)  Completed  .  Hepatitis C: One time screening is recommended by Center for Disease Control  (CDC) for  adults born from [birth year] through 1966.   Completed  . Pneumonia vaccines  Completed

## 2019-04-23 ENCOUNTER — Telehealth: Payer: Self-pay | Admitting: Internal Medicine

## 2019-04-23 NOTE — Telephone Encounter (Signed)
Pt would like to know what her blood type is? Please advise and Thank you!  Call pt @ [phone]

## 2019-04-23 NOTE — Telephone Encounter (Signed)
Blood types cannot be determined by standard blood work.  I suggest she donate blood at a local blood drive ,  Then she will find out for free

## 2019-04-25 NOTE — Telephone Encounter (Signed)
Spoke with pt and informed her that we do not do blood typing here at our office but that she could go to a local blood drive to give blood and they will let her know. Pt gave a verbal understanding.

## 2019-06-25 ENCOUNTER — Other Ambulatory Visit: Payer: Self-pay | Admitting: Internal Medicine

## 2019-07-03 DIAGNOSIS — H02834 Dermatochalasis of left upper eyelid: Secondary | ICD-10-CM | POA: Diagnosis not present

## 2019-07-03 DIAGNOSIS — H57813 Brow ptosis, bilateral: Secondary | ICD-10-CM | POA: Diagnosis not present

## 2019-07-03 DIAGNOSIS — H02831 Dermatochalasis of right upper eyelid: Secondary | ICD-10-CM | POA: Diagnosis not present

## 2019-07-23 ENCOUNTER — Other Ambulatory Visit: Payer: Self-pay | Admitting: Internal Medicine

## 2019-07-23 ENCOUNTER — Other Ambulatory Visit: Payer: Self-pay | Admitting: Pulmonary Disease

## 2019-08-01 ENCOUNTER — Other Ambulatory Visit: Payer: Self-pay | Admitting: Internal Medicine

## 2019-08-30 ENCOUNTER — Other Ambulatory Visit: Payer: Self-pay | Admitting: Pulmonary Disease

## 2019-09-26 ENCOUNTER — Encounter: Payer: Self-pay | Admitting: Pulmonary Disease

## 2019-09-26 ENCOUNTER — Other Ambulatory Visit: Payer: Self-pay | Admitting: Pulmonary Disease

## 2019-09-26 ENCOUNTER — Ambulatory Visit: Payer: Medicare Other | Admitting: Pulmonary Disease

## 2019-09-26 ENCOUNTER — Other Ambulatory Visit: Payer: Self-pay

## 2019-09-26 VITALS — BP 122/62 | HR 92 | Temp 97.8°F | Ht 63.0 in | Wt 183.2 lb

## 2019-09-26 DIAGNOSIS — H57813 Brow ptosis, bilateral: Secondary | ICD-10-CM | POA: Diagnosis not present

## 2019-09-26 DIAGNOSIS — J4540 Moderate persistent asthma, uncomplicated: Secondary | ICD-10-CM

## 2019-09-26 DIAGNOSIS — J309 Allergic rhinitis, unspecified: Secondary | ICD-10-CM

## 2019-09-26 DIAGNOSIS — R0602 Shortness of breath: Secondary | ICD-10-CM

## 2019-09-26 MED ORDER — BREO ELLIPTA 100-25 MCG/INH IN AEPB: 1.0000 | INHALATION_SPRAY | Freq: Every day | RESPIRATORY_TRACT | 0 refills | Status: DC

## 2019-09-26 NOTE — Patient Instructions (Signed)
Lungs sounded clear today.   We will continue Breo Ellipta, 1 puff daily.   We will see you in follow-up in 6 months time call sooner should any new difficulties arise.

## 2019-09-26 NOTE — Progress Notes (Signed)
Subjective:    Patient ID: SISTER SCHLAGETER, female    DOB: 15-May-1950, 69 y.o.   MRN: [id number]  HPI Patient is a 69 year old lifelong never smoker, with a history of moderate persistent asthma here for follow-up of the same.  Patient previously placed on Breo Ellipta 100/25.  Patient notes that she has been doing very well with this.  Here recently she did have a mild exacerbation after having mowed her lawn without using protective mask.  She did develop some nasal congestion which is now clearing.  She has not had any fevers, chills or sweats.  Cough is productive of scant pale yellowish sputum but no other discoloration or hemoptysis.  No chest pain, paroxysmal nocturnal dyspnea, orthopnea, lower extremity edema or calf tenderness.  Overall she feels that she is doing well.  Patient states that Memory Dance has been "a life saver".   Review of Systems A 10 point review of systems was performed and it is as noted above otherwise negative.  No Known Allergies  Current Meds  Medication Sig  . ascorbic acid (VITAMIN C) 1000 MG tablet Take by mouth.  . budesonide (PULMICORT) 0.25 MG/2ML nebulizer solution Take 2 mLs (0.25 mg total) by nebulization 2 (two) times daily.  . Calcium Carb-Cholecalciferol (CALCIUM 600 + D PO) Take 2 tablets by mouth daily.  . citalopram (CELEXA) 20 MG tablet TAKE ONE TABLET BY MOUTH EVERY DAY  . famotidine (PEPCID) 20 MG tablet TAKE 1 TABLET BY MOUTH DAILY. BEFORE DINNER (Patient taking differently: Take 20 mg by mouth every evening. )  . fexofenadine (ALLEGRA) 180 MG tablet Take 180 mg by mouth daily.  Marland Kitchen ibuprofen (ADVIL,MOTRIN) 200 MG tablet Take 600 mg by mouth daily as needed for moderate pain.   Marland Kitchen ipratropium-albuterol (DUONEB) 0.5-2.5 (3) MG/3ML SOLN Take 3 mLs by nebulization every 6 (six) hours as needed.  Marland Kitchen levothyroxine (SYNTHROID) 50 MCG tablet TAKE 1 TABLET EVERY DAY ON EMPTY STOMACHWITH A GLASS OF WATER AT LEAST 30-60 MINBEFORE BREAKFAST  . Omega-3 Fatty  Acids (FISH OIL) 1200 MG CAPS Take 1,200 mg by mouth daily.   . pantoprazole (PROTONIX) 40 MG tablet TAKE ONE TABLET BY MOUTH EVERY MORNING 30 MIN BEFORE BREAKFAST  . [DISCONTINUED] BREO ELLIPTA 100-25 MCG/INH AEPB Inhale 1 puff into the lungs daily.   Immunization History  Administered Date(s) Administered  . Fluad Quad(high Dose 65+) 12/06/2018  . Influenza, High Dose Seasonal PF 04/22/2016, 06/05/2017  . Influenza,inj,Quad PF,6+ Mos 04/26/2013, 01/30/2015  . Influenza-Unspecified 01/24/2018, 12/06/2018  . Pneumococcal Conjugate-13 09/01/2014, 01/24/2018  . Pneumococcal Polysaccharide-23 04/26/2013, 02/28/2019  . Tdap 06/18/2014  . Zoster Recombinat (Shingrix) 07/08/2017, 09/22/2017      Objective:   Physical Exam BP 122/62 (BP Location: Left Arm, Patient Position: Sitting, Cuff Size: Large)   Pulse 92   Temp 97.8 F (36.6 C) (Temporal)   Ht 5\' 3"  (1.6 m)   Wt 183 lb 3.2 oz (83.1 kg)   SpO2 97%   BMI 32.45 kg/m   GENERAL: Overweight, no respiratory distress.  Fully ambulatory. HEAD: Normocephalic, atraumatic.  EYES: Pupils equal, round, reactive to light.  No scleral icterus.  MOUTH: Nose/mouth/throat not examined due to masking requirements for COVID 19. NECK: Supple. No thyromegaly. Trachea midline. No JVD.  No adenopathy. PULMONARY: Lungs clear to auscultation bilaterally.  Good air entry bilaterally. CARDIOVASCULAR: S1 and S2. Regular rate and rhythm.  GASTROINTESTINAL: Benign. MUSCULOSKELETAL: No joint deformity, no clubbing, no edema.  NEUROLOGIC: No focal deficit noted.  Fully ambulatory  with no gait disturbance noted.  Speech is fluent.  Awake, alert and oriented. SKIN: Intact,warm,dry. PSYCH: Mood and behavior normal.       Assessment & Plan:     ICD-10-CM   1. Moderate persistent asthma without complication  123456    Well compensated on Breo Ellipta Continue Breo 100/25 1 puff daily  2. SOB (shortness of breath)  R06.02    Resolved with the use of  Breo Able to perform ADLs without difficulty  3. Allergic sinusitis  J30.9    Responding to conservative management   Discussion:  Patient is well compensated with Adair Patter, she is to continue the same.  She had a mild allergic rhinitis/sinusitis flare however this is responding to conservative measures.  No need for antibiotics or systemic steroids.  We have refilled her Breo Ellipta and see the patient in follow-up in 6 months time.  She is to contact us prior to that time should any new difficulties arise.   Renold Don, MD [facility]   *This note was dictated using voice recognition software/Dragon.  Despite best efforts to proofread, errors can occur which can change the meaning.  Any change was purely unintentional.

## 2019-09-26 NOTE — Telephone Encounter (Signed)
Patient here for an appt to refill her Breo inhaler as she was overdue. RX sent to pharmacy patient aware

## 2019-09-26 NOTE — Telephone Encounter (Signed)
Called and spoke with Robin at [facility] in regards to refill on Breo. Let him know that we could refill it but that the patient is past due for an appointment. Let him know that I would reach out to patient to let her know.  ATC patient LMTCB so that we can schedule her an appointment so that we can continue to refill her medications. Will wait to hear back from patient to get her an appointment.

## 2019-09-27 NOTE — Telephone Encounter (Signed)
Patient was added to schedule yesterday and was seen by Dr. Patsey Berthold. Inhalers were given and RX sent to pharmacy. Patient states she has sinus infection but feels like it is getting better. Asked patient to call the office if her symptoms get worse. Dr. Patsey Berthold offered antibiotic patient declined at this time. Nothing further needed at this time.

## 2019-09-30 ENCOUNTER — Telehealth: Payer: Self-pay | Admitting: Pulmonary Disease

## 2019-09-30 MED ORDER — PREDNISONE 20 MG PO TABS: 20.0000 mg | ORAL_TABLET | Freq: Every day | ORAL | 0 refills | Status: DC

## 2019-09-30 MED ORDER — AZITHROMYCIN 250 MG PO TABS: 250.0000 mg | ORAL_TABLET | ORAL | 0 refills | Status: DC

## 2019-09-30 NOTE — Telephone Encounter (Signed)
Z pak Pred 20 mg daily for 7 days of she likes as well

## 2019-09-30 NOTE — Telephone Encounter (Signed)
Spoke with the pt  She was just seen on 09/26/19 by Dr Patsey Berthold and doing well  Started having Chest tightness, wheezing, cough- non prod, increased SOB x 2 days ago  She has not used her neb or albuterol inhaler  She states no body aches, fevers, chills  Requesting abx Please advise thanks

## 2019-09-30 NOTE — Telephone Encounter (Signed)
Spoke with the pt and notified of recs per Dr Mortimer Fries  She verbalized understanding  Wants pred as well  Rxs were sent to pharm

## 2019-09-30 NOTE — Telephone Encounter (Signed)
LMTCB x1 for pt.  

## 2019-11-27 DIAGNOSIS — J189 Pneumonia, unspecified organism: Secondary | ICD-10-CM | POA: Diagnosis not present

## 2019-11-27 DIAGNOSIS — Z20822 Contact with and (suspected) exposure to covid-19: Secondary | ICD-10-CM | POA: Diagnosis not present

## 2019-11-27 DIAGNOSIS — K219 Gastro-esophageal reflux disease without esophagitis: Secondary | ICD-10-CM | POA: Diagnosis not present

## 2019-11-27 DIAGNOSIS — J4531 Mild persistent asthma with (acute) exacerbation: Secondary | ICD-10-CM | POA: Diagnosis not present

## 2019-11-27 DIAGNOSIS — R52 Pain, unspecified: Secondary | ICD-10-CM | POA: Diagnosis not present

## 2019-11-27 DIAGNOSIS — E785 Hyperlipidemia, unspecified: Secondary | ICD-10-CM | POA: Diagnosis not present

## 2019-11-27 DIAGNOSIS — Z79899 Other long term (current) drug therapy: Secondary | ICD-10-CM | POA: Diagnosis not present

## 2019-11-27 DIAGNOSIS — E039 Hypothyroidism, unspecified: Secondary | ICD-10-CM | POA: Diagnosis not present

## 2019-11-27 DIAGNOSIS — R069 Unspecified abnormalities of breathing: Secondary | ICD-10-CM | POA: Diagnosis not present

## 2019-11-27 DIAGNOSIS — Z79890 Hormone replacement therapy (postmenopausal): Secondary | ICD-10-CM | POA: Diagnosis not present

## 2019-11-27 DIAGNOSIS — D72829 Elevated white blood cell count, unspecified: Secondary | ICD-10-CM | POA: Diagnosis not present

## 2019-11-27 DIAGNOSIS — R531 Weakness: Secondary | ICD-10-CM | POA: Diagnosis not present

## 2019-11-27 DIAGNOSIS — R0902 Hypoxemia: Secondary | ICD-10-CM | POA: Diagnosis not present

## 2019-11-27 DIAGNOSIS — I517 Cardiomegaly: Secondary | ICD-10-CM | POA: Diagnosis not present

## 2019-11-27 DIAGNOSIS — R7303 Prediabetes: Secondary | ICD-10-CM | POA: Diagnosis not present

## 2019-11-27 DIAGNOSIS — R0602 Shortness of breath: Secondary | ICD-10-CM | POA: Diagnosis not present

## 2019-11-28 ENCOUNTER — Other Ambulatory Visit: Payer: Self-pay | Admitting: Internal Medicine

## 2020-01-16 ENCOUNTER — Other Ambulatory Visit: Payer: Self-pay | Admitting: Internal Medicine

## 2020-01-23 DIAGNOSIS — J45909 Unspecified asthma, uncomplicated: Secondary | ICD-10-CM | POA: Diagnosis not present

## 2020-02-03 ENCOUNTER — Other Ambulatory Visit: Payer: Self-pay

## 2020-02-03 ENCOUNTER — Encounter: Payer: Self-pay | Admitting: Ophthalmology

## 2020-02-05 ENCOUNTER — Other Ambulatory Visit: Payer: Self-pay

## 2020-02-05 ENCOUNTER — Other Ambulatory Visit
Admission: RE | Admit: 2020-02-05 | Discharge: 2020-02-05 | Disposition: A | Discharge status: Home / Self Care | Payer: Medicare Other | Source: Ambulatory Visit | Attending: Ophthalmology | Admitting: Ophthalmology

## 2020-02-05 DIAGNOSIS — Z20822 Contact with and (suspected) exposure to covid-19: Secondary | ICD-10-CM | POA: Diagnosis not present

## 2020-02-05 DIAGNOSIS — Z01812 Encounter for preprocedural laboratory examination: Secondary | ICD-10-CM | POA: Insufficient documentation

## 2020-02-05 NOTE — Discharge Instructions (Signed)
INSTRUCTIONS FOLLOWING OCULOPLASTIC SURGERY AMY M. FOWLER, MD  AFTER YOUR EYE SURGERY, THER ARE MANY THINGS WHICH YOU, THE PATIENT, CAN DO TO ASSURE THE BEST POSSIBLE RESULT FROM YOUR OPERATION.  THIS SHEET SHOULD BE REFERRED TO WHENEVER QUESTIONS ARISE.  IF THERE ARE ANY QUESTIONS NOT ANSWERED HERE, DO NOT HESITATE TO CALL OUR OFFICE AT [phone] OR [phone].  THERE IS ALWAYS SOMEONE AVAILABLE TO CALL IF QUESTIONS OR PROBLEMS ARISE.  VISION: Your vision may be blurred and out of focus after surgery until you are able to stop using your ointment, swelling resolves and your eye(s) heal. This may take 1 to 2 weeks at the least.  If your vision becomes gradually more dim or dark, this is not normal and you need to call our office immediately.  EYE CARE: For the first 48 hours after surgery, use ice packs frequently - "20 minutes on, 20 minutes off" - to help reduce swelling and bruising.  Small bags of frozen peas or corn make good ice packs along with cloths soaked in ice water.  If you are wearing a patch or other type of dressing following surgery, keep this on for the amount of time specified by your doctor.  For the first week following surgery, you will need to treat your stitches with great care.  It is OK to shower, but take care to not allow soapy water to run into your eye(s) to help reduce chances of infection.  You may gently clean the eyelashes and around the eye(s) with cotton balls and sterile water, BUT DO NOT RUB THE STITCHES VIGOROUSLY.  Keeping your stitches moist with ointment will help promote healing with minimal scar formation.  ACTIVITY: When you leave the surgery center, you should go home, rest and be inactive.  The eye(s) may feel scratchy and keeping the eyes closed will allow for faster healing.  The first week following surgery, avoid straining (anything making the face turn red) or lifting over 20 pounds.  Additionally, avoid bending which causes your head to go below  your waist.  Using your eyes will NOT harm them, so feel free to read, watch television, use the computer, etc as desired.  Driving depends on each individual, so check with your doctor if you have questions about driving. Do not wear contact lenses for about 2 weeks.  Do not wear eye makeup for 2 weeks.  Avoid swimming, hot tubs, gardening, and dusting for 1 to 2 weeks to reduce the risk of an infection.  MEDICATIONS:  You will be given a prescription for an ointment to use 4 times a day on your stitches.  You can use the ointment in your eyes if they feel scratchy or irritated.  If you eyelid(s) don't close completely when you sleep, put some ointment in your eyes before bedtime.  EMERGENCY: If you experience SEVERE EYE PAIN OR HEADACHE UNRELIEVED BY TYLENOL OR TRAMADOL, NAUSEA OR VOMITING, WORSENING REDNESS, OR WORSENING VISION (ESPECIALLY VISION THAT WAS INITIALLY BETTER) CALL [phone] OR [phone] DURING BUSINESS HOURS OR AFTER HOURS.  General Anesthesia, Adult, Care After This sheet gives you information about how to care for yourself after your procedure. Your health care provider may also give you more specific instructions. If you have problems or questions, contact your health care provider. What can I expect after the procedure? After the procedure, the following side effects are common:  Pain or discomfort at the IV site.  Nausea.  Vomiting.  Sore throat.  Trouble concentrating.    Feeling cold or chills.  Weak or tired.  Sleepiness and fatigue.  Soreness and body aches. These side effects can affect parts of the body that were not involved in surgery. Follow these instructions at home:  For at least 24 hours after the procedure:  Have a responsible adult stay with you. It is important to have someone help care for you until you are awake and alert.  Rest as needed.  Do not: ? Participate in activities in which you could fall or become injured. ? Drive. ? Use  heavy machinery. ? Drink alcohol. ? Take sleeping pills or medicines that cause drowsiness. ? Make important decisions or sign legal documents. ? Take care of children on your own. Eating and drinking  Follow any instructions from your health care provider about eating or drinking restrictions.  When you feel hungry, start by eating small amounts of foods that are soft and easy to digest (bland), such as toast. Gradually return to your regular diet.  Drink enough fluid to keep your urine pale yellow.  If you vomit, rehydrate by drinking water, juice, or clear broth. General instructions  If you have sleep apnea, surgery and certain medicines can increase your risk for breathing problems. Follow instructions from your health care provider about wearing your sleep device: ? Anytime you are sleeping, including during daytime naps. ? While taking prescription pain medicines, sleeping medicines, or medicines that make you drowsy.  Return to your normal activities as told by your health care provider. Ask your health care provider what activities are safe for you.  Take over-the-counter and prescription medicines only as told by your health care provider.  If you smoke, do not smoke without supervision.  Keep all follow-up visits as told by your health care provider. This is important. Contact a health care provider if:  You have nausea or vomiting that does not get better with medicine.  You cannot eat or drink without vomiting.  You have pain that does not get better with medicine.  You are unable to pass urine.  You develop a skin rash.  You have a fever.  You have redness around your IV site that gets worse. Get help right away if:  You have difficulty breathing.  You have chest pain.  You have blood in your urine or stool, or you vomit blood. Summary  After the procedure, it is common to have a sore throat or nausea. It is also common to feel tired.  Have a  responsible adult stay with you for the first 24 hours after general anesthesia. It is important to have someone help care for you until you are awake and alert.  When you feel hungry, start by eating small amounts of foods that are soft and easy to digest (bland), such as toast. Gradually return to your regular diet.  Drink enough fluid to keep your urine pale yellow.  Return to your normal activities as told by your health care provider. Ask your health care provider what activities are safe for you. This information is not intended to replace advice given to you by your health care provider. Make sure you discuss any questions you have with your health care provider. Document Revised: 04/14/2017 Document Reviewed: 11/25/2016 Elsevier Patient Education  2020 Elsevier Inc.  

## 2020-02-06 LAB — SARS CORONAVIRUS 2 (TAT 6-24 HRS): SARS Coronavirus 2: NEGATIVE

## 2020-02-07 ENCOUNTER — Ambulatory Visit: Payer: Medicare Other | Admitting: Anesthesiology

## 2020-02-07 ENCOUNTER — Encounter: Payer: Self-pay | Admitting: Ophthalmology

## 2020-02-07 ENCOUNTER — Other Ambulatory Visit: Payer: Self-pay

## 2020-02-07 ENCOUNTER — Ambulatory Visit
Admission: RE | Admit: 2020-02-07 | Discharge: 2020-02-07 | Disposition: A | Discharge status: Home / Self Care | Payer: Medicare Other | Attending: Ophthalmology | Admitting: Ophthalmology

## 2020-02-07 ENCOUNTER — Encounter
Admission: RE | Disposition: A | Discharge status: Home / Self Care | Payer: Self-pay | Source: Home / Self Care | Attending: Ophthalmology

## 2020-02-07 DIAGNOSIS — Z79899 Other long term (current) drug therapy: Secondary | ICD-10-CM | POA: Diagnosis not present

## 2020-02-07 DIAGNOSIS — H02834 Dermatochalasis of left upper eyelid: Secondary | ICD-10-CM | POA: Diagnosis not present

## 2020-02-07 DIAGNOSIS — K219 Gastro-esophageal reflux disease without esophagitis: Secondary | ICD-10-CM | POA: Insufficient documentation

## 2020-02-07 DIAGNOSIS — Z7951 Long term (current) use of inhaled steroids: Secondary | ICD-10-CM | POA: Insufficient documentation

## 2020-02-07 DIAGNOSIS — Z79890 Hormone replacement therapy (postmenopausal): Secondary | ICD-10-CM | POA: Insufficient documentation

## 2020-02-07 DIAGNOSIS — H02831 Dermatochalasis of right upper eyelid: Secondary | ICD-10-CM | POA: Diagnosis not present

## 2020-02-07 DIAGNOSIS — H57813 Brow ptosis, bilateral: Secondary | ICD-10-CM | POA: Diagnosis not present

## 2020-02-07 DIAGNOSIS — J45909 Unspecified asthma, uncomplicated: Secondary | ICD-10-CM | POA: Diagnosis not present

## 2020-02-07 DIAGNOSIS — F32A Depression, unspecified: Secondary | ICD-10-CM | POA: Insufficient documentation

## 2020-02-07 DIAGNOSIS — Z9849 Cataract extraction status, unspecified eye: Secondary | ICD-10-CM | POA: Insufficient documentation

## 2020-02-07 DIAGNOSIS — M1990 Unspecified osteoarthritis, unspecified site: Secondary | ICD-10-CM | POA: Diagnosis not present

## 2020-02-07 HISTORY — DX: Presence of dental prosthetic device (complete) (partial): Z97.2

## 2020-02-07 HISTORY — PX: BROW LIFT: SHX178

## 2020-02-07 SURGERY — BLEPHAROPLASTY
Anesthesia: Monitor Anesthesia Care | Site: Eye | Laterality: Bilateral

## 2020-02-07 MED ORDER — KETOROLAC TROMETHAMINE 30 MG/ML IJ SOLN: 15.0000 mg | Freq: Once | INTRAMUSCULAR | Status: DC | PRN

## 2020-02-07 MED ORDER — OXYCODONE HCL 5 MG PO TABS: 5.0000 mg | ORAL_TABLET | Freq: Once | ORAL | Status: DC | PRN

## 2020-02-07 MED ORDER — ERYTHROMYCIN 5 MG/GM OP OINT: TOPICAL_OINTMENT | OPHTHALMIC | 2 refills | Status: DC

## 2020-02-07 MED ORDER — FENTANYL CITRATE (PF) 100 MCG/2ML IJ SOLN: 25.0000 ug | INTRAMUSCULAR | Status: DC | PRN

## 2020-02-07 MED ORDER — MIDAZOLAM HCL 2 MG/2ML IJ SOLN
INTRAMUSCULAR | Status: DC | PRN
  Administered 2020-02-07 (×2): 1 mg via INTRAVENOUS

## 2020-02-07 MED ORDER — ALFENTANIL 500 MCG/ML IJ INJ
INJECTION | INTRAVENOUS | Status: DC | PRN
Start: 2020-02-07 — End: 2020-02-07
  Administered 2020-02-07: 500 ug via INTRAVENOUS
  Administered 2020-02-07 (×2): 250 ug via INTRAVENOUS

## 2020-02-07 MED ORDER — TRAMADOL HCL 50 MG PO TABS
ORAL_TABLET | ORAL | 0 refills | Status: DC
Start: 2020-02-07 — End: 2020-07-15

## 2020-02-07 MED ORDER — LACTATED RINGERS IV SOLN: INTRAVENOUS | Status: DC

## 2020-02-07 MED ORDER — TETRACAINE HCL 0.5 % OP SOLN
OPHTHALMIC | Status: DC | PRN
  Administered 2020-02-07: 2 [drp] via OPHTHALMIC

## 2020-02-07 MED ORDER — ONDANSETRON HCL 4 MG/2ML IJ SOLN: 4.0000 mg | Freq: Once | INTRAMUSCULAR | Status: DC | PRN

## 2020-02-07 MED ORDER — OXYCODONE HCL 5 MG/5ML PO SOLN: 5.0000 mg | Freq: Once | ORAL | Status: DC | PRN

## 2020-02-07 MED ORDER — ERYTHROMYCIN 5 MG/GM OP OINT
TOPICAL_OINTMENT | OPHTHALMIC | Status: DC | PRN
  Administered 2020-02-07: 1 via OPHTHALMIC

## 2020-02-07 MED ORDER — LIDOCAINE-EPINEPHRINE 2 %-1:100000 IJ SOLN
INTRAMUSCULAR | Status: DC | PRN
  Administered 2020-02-07: 6 mL via OPHTHALMIC

## 2020-02-07 MED ORDER — BSS IO SOLN
INTRAOCULAR | Status: DC | PRN
  Administered 2020-02-07: 15 mL

## 2020-02-07 MED ORDER — PROPOFOL 500 MG/50ML IV EMUL
INTRAVENOUS | Status: DC | PRN
  Administered 2020-02-07: 25 ug/kg/min via INTRAVENOUS

## 2020-02-07 SURGICAL SUPPLY — 26 items
APPLICATOR COTTON TIP WD 3 STR (MISCELLANEOUS) ×2 IMPLANT
BLADE SURG 15 STRL LF DISP TIS (BLADE) ×2 IMPLANT
BLADE SURG 15 STRL SS (BLADE) ×4
CORD BIP STRL DISP 12FT (MISCELLANEOUS) ×2 IMPLANT
DRAPE HEAD BAR (DRAPES) ×2 IMPLANT
GAUZE SPONGE 4X4 12PLY STRL (GAUZE/BANDAGES/DRESSINGS) ×2 IMPLANT
GLOVE SURG LX 7.0 MICRO (GLOVE) ×2
GLOVE SURG LX STRL 7.0 MICRO (GLOVE) ×2 IMPLANT
GOWN STRL REUS W/ TWL LRG LVL3 (GOWN DISPOSABLE) ×1 IMPLANT
GOWN STRL REUS W/TWL LRG LVL3 (GOWN DISPOSABLE) ×2
MARKER SKIN XFINE TIP W/RULER (MISCELLANEOUS) ×2 IMPLANT
NEEDLE FILTER BLUNT 18X 1/2SAF (NEEDLE) ×1
NEEDLE FILTER BLUNT 18X1 1/2 (NEEDLE) ×1 IMPLANT
NEEDLE HYPO 30X.5 LL (NEEDLE) ×4 IMPLANT
PACK ENT CUSTOM (PACKS) ×2 IMPLANT
SOL PREP PVP 2OZ (MISCELLANEOUS) ×2
SOLUTION PREP PVP 2OZ (MISCELLANEOUS) ×1 IMPLANT
SPONGE GAUZE 2X2 8PLY STRL LF (GAUZE/BANDAGES/DRESSINGS) ×20 IMPLANT
SUT CHROMIC 4-0 (SUTURE) ×4
SUT CHROMIC 4-0 M2 12X2 ARM (SUTURE) ×2
SUT GUT PLAIN 6-0 1X18 ABS (SUTURE) ×2 IMPLANT
SUT PROLENE 5 0 P 3 (SUTURE) ×4 IMPLANT
SUTURE CHRMC 4-0 M2 12X2 ARM (SUTURE) ×2 IMPLANT
SYR 10ML LL (SYRINGE) ×2 IMPLANT
SYR 3ML LL SCALE MARK (SYRINGE) ×2 IMPLANT
WATER STERILE IRR 250ML POUR (IV SOLUTION) ×2 IMPLANT

## 2020-02-07 NOTE — Transfer of Care (Signed)
Immediate Anesthesia Transfer of Care Note  Patient: Sarah [name]  Procedure(s) Performed: BLEPHAROPLASTY UPPER EYELID; W/EXCESS SKIN BROW PTOSIS REPAIR BILATERAL (Bilateral Eye)  Patient Location: PACU  Anesthesia Type: MAC  Level of Consciousness: awake, alert  and patient cooperative  Airway and Oxygen Therapy: Patient Spontanous Breathing and Patient connected to supplemental oxygen  Post-op Assessment: Post-op Vital signs reviewed, Patient's Cardiovascular Status Stable, Respiratory Function Stable, Patent Airway and No signs of Nausea or vomiting  Post-op Vital Signs: Reviewed and stable  Complications: No complications documented.

## 2020-02-07 NOTE — Anesthesia Postprocedure Evaluation (Signed)
Anesthesia Post Note  Patient: Sarah [name]  Procedure(s) Performed: BLEPHAROPLASTY UPPER EYELID; W/EXCESS SKIN BROW PTOSIS REPAIR BILATERAL (Bilateral Eye)     Patient location during evaluation: PACU Anesthesia Type: MAC Level of consciousness: awake and alert Pain management: pain level controlled Vital Signs Assessment: post-procedure vital signs reviewed and stable Respiratory status: spontaneous breathing, nonlabored ventilation, respiratory function stable and patient connected to nasal cannula oxygen Cardiovascular status: stable and blood pressure returned to baseline Postop Assessment: no apparent nausea or vomiting Anesthetic complications: no   No complications documented.  Sinda Du

## 2020-02-07 NOTE — Interval H&P Note (Signed)
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  The various methods of treatment have been discussed with the patient and family. After consideration of risks, benefits and other options for treatment, the patient has consented to  Procedure(s): BLEPHAROPLASTY UPPER EYELID; W/EXCESS SKIN BROW PTOSIS REPAIR BILATERAL (Bilateral) as a surgical intervention.  The patient's history has been reviewed, patient examined, no change in status, stable for surgery.  I have reviewed the patient's chart and labs.  Questions were answered to the patient's satisfaction.     Vickki Muff, Aleeha Boline M

## 2020-02-07 NOTE — Op Note (Signed)
Preoperative Diagnosis:   1.  Visually significant bilateral brow ptosis.  2.  Visually significant dermatochalasis both Upper Eyelid(s)  Postoperative Diagnosis: Same.   Procedure(s) Performed:   1. bilateral  Direct brow lift to improve vision.  2.  Upper eyelid blepharoplasty with excess skin excision both  Upper Eyelid(s)  Surgeon: Philis Pique. Vickki Muff, M.D.   Assistants: None   Anesthesia: MAC  Specimens: None.  Estimated Blood Loss: Minimal.  Complications: None.  Operative Findings: None Dictated  PROCEDURE:   Allergies were reviewed and the patient has No Known Allergies..   After the risks, benefits, complications and alternatives were discussed with the patient, appropriate informed consent was obtained. While seated in an upright position and looking in primary gaze, the amount of supra-brow skin to be removed was measured and marked in an elliptical pattern. The patient was then brought to the operating suite and reclined supine.  Timeout was conducted and the patient was sedated. Local anesthetic consisting of a 50-50 mixture of 2% lidocaine with epinephrine and 0.75% bupivacaine with added Hylenex was injected subcutaneously to the bilateral  brow region(s) and down to the periosteum and  subcutaneously to both  upper eyelid(s). After adequate local was instilled, the patient was prepped and draped in the usual sterile fashion for eyelid surgery.   Attention was turned to the right brow region. A #15 blade was used to create a bevelled incision along the premarked incision line. A skin and subcutaneous tissue flap was then excised and hemostasis was obtained with bipolar cautery. The deep tissues were reapproximated with interrupted vertical 5-0 chromic sutures. The skin margin was reapproximated with a running locking 5-0 Prolene suture. Attention was then turned to the opposite brow region where the same procedure was performed in the same manner.    Attention was then  turned to the upper eyelids. A 9m upper eyelid crease incision line was marked with calipers on both  upper eyelid(s).  A pinch test was used to estimate the amount of excess skin to remove and this was marked in standard blepharoplasty style fashion. Attention was turned to the right  upper eyelid. A #15 blade was used to open the premarked incision line. A Skin and muscle flap was excised and hemostasis was obtained with bipolar cautery.   Attention was then turned to the opposite eyelid where the same procedure was performed in the same manner.   The skin incisions were closed with a combination of interrupted and  running 6-0 fast absorbing plain gut suture.   The patient tolerated the procedure well. Erythromycin ophthalmic ointment was applied to the incision site(s) followed by ice packs.The patient was taken to the recovery area where she recovered without difficulty.  Post-Op Plan/Instructions:  The patient was instructed to use ice packs frequently for the next 48 hours.  she was instructed to use Erythromycin ophthalmic ophthalmic ointment on the eyelid incisions and over-the-counter antibiotic ointment on the brow sutures 4 times a day for the next 12 to 14 days. she was given a prescription for Tramadol (or similar) for pain control should Tylenol not be effective. she was asked to to follow up in 10-12 days time for suture removal or sooner as needed for problems.   Aquilla Shambley M. FVickki Muff M.D. Ophthalmology

## 2020-02-07 NOTE — H&P (Signed)
See the history and physical completed at [facility] on 01/23/2020 and scanned into the chart.

## 2020-02-07 NOTE — Anesthesia Preprocedure Evaluation (Signed)
Anesthesia Evaluation  Patient identified by MRN, date of birth, ID band Patient awake    Reviewed: Allergy & Precautions, NPO status , Patient's Chart, lab work & pertinent test results  History of Anesthesia Complications Negative for: history of anesthetic complications  Airway Mallampati: II  TM Distance: >3 FB Neck ROM: Full    Dental  (+) Implants   Pulmonary shortness of breath and with exertion, asthma , neg sleep apnea,  Seasonal allergies   Pulmonary exam normal breath sounds clear to auscultation- rhonchi (-) wheezing      Cardiovascular Exercise Tolerance: Good (-) hypertension(-) CAD and (-) Past MI negative cardio ROS Normal cardiovascular exam Rhythm:Regular Rate:Normal - Systolic murmurs and - Diastolic murmurs    Neuro/Psych PSYCHIATRIC DISORDERS Depression negative neurological ROS     GI/Hepatic Neg liver ROS, GERD  Medicated,  Endo/Other  neg diabetesHypothyroidism   Renal/GU negative Renal ROS  negative genitourinary   Musculoskeletal  (+) Arthritis , Osteoarthritis,    Abdominal (+) + obese,   Peds negative pediatric ROS (+)  Hematology negative hematology ROS (+)   Anesthesia Other Findings Past Medical History: No date: Arthritis No date: Asthma No date: Depression No date: Dyspnea     Comment: with heat No date: GERD (gastroesophageal reflux disease)   Reproductive/Obstetrics                             Anesthesia Physical  Anesthesia Plan  ASA: III  Anesthesia Plan: MAC   Post-op Pain Management:    Induction: Intravenous  PONV Risk Score and Plan: Treatment may vary due to age or medical condition  Airway Management Planned: Nasal Cannula and Natural Airway  Additional Equipment:   Intra-op Plan:   Post-operative Plan:   Informed Consent: I have reviewed the patients History and Physical, chart, labs and discussed the procedure including  the risks, benefits and alternatives for the proposed anesthesia with the patient or authorized representative who has indicated his/her understanding and acceptance.       Plan Discussed with: CRNA and Anesthesiologist  Anesthesia Plan Comments:         Anesthesia Quick Evaluation  Patient Active Problem List   Diagnosis Date Noted  . Educated about COVID-19 virus infection 04/10/2019  . Prediabetes 10/13/2018  . LVH (left ventricular hypertrophy) due to hypertensive disease, without heart failure 10/13/2018  . Hospital discharge follow-up 07/08/2018  . Chest tightness 06/26/2018  . Asthma 06/04/2018  . SOB (shortness of breath) 04/08/2018  . Weight loss, unintentional 10/08/2017  . Hypothyroid 06/15/2017  . Post herpetic neuralgia 06/06/2017  . Encounter for preventive health examination 09/09/2014  . Esophageal reflux 09/09/2014  . Hyperlipidemia 08/17/2014  . Elevated blood pressure reading without diagnosis of hypertension 11/24/2013  . S/P hysterectomy 04/28/2013  . Initial Medicare annual wellness visit 04/28/2013  . Depression     CBC Latest Ref Rng & Units 04/08/2019 06/29/2018 06/28/2018  WBC 4.0 - 10.5 K/uL 6.3 4.3 4.8  Hemoglobin 12.0 - 15.0 g/dL 12.2 11.6(L) 12.3  Hematocrit 36 - 46 % 36.6 35.8(L) 36.7  Platelets 150 - 400 K/uL 174.0 167 166   BMP Latest Ref Rng & Units 04/08/2019 06/29/2018 06/28/2018  Glucose 70 - 99 mg/dL 107(H) 268(H) 114(H)  BUN 6 - 23 mg/dL _0 Creatinine 0.40 - 1.20 mg/dL 0.88 0.80 0.71  BUN/Creat Ratio 6 - 22 (calc) - - -  Sodium 135 - 145 mEq/L 142 140 139  Potassium 3.5 - 5.1 mEq/L 3.9 3.8 3.7  Chloride 96 - 112 mEq/L 105 106 106  CO2 19 - 32 mEq/L _0 Calcium 8.4 - 10.5 mg/dL 9.4 9.0 9.0    Risks and benefits of anesthesia discussed at length, patient or surrogate demonstrates understanding. Appropriately NPO. Plan to proceed with anesthesia.  Champ Mungo, MD 02/07/20

## 2020-02-10 ENCOUNTER — Encounter: Payer: Self-pay | Admitting: Ophthalmology

## 2020-03-10 ENCOUNTER — Other Ambulatory Visit: Payer: Self-pay | Admitting: Internal Medicine

## 2020-03-10 DIAGNOSIS — Z1231 Encounter for screening mammogram for malignant neoplasm of breast: Secondary | ICD-10-CM

## 2020-04-07 ENCOUNTER — Other Ambulatory Visit: Payer: Self-pay

## 2020-04-07 ENCOUNTER — Encounter: Payer: Self-pay | Admitting: Pulmonary Disease

## 2020-04-07 ENCOUNTER — Ambulatory Visit: Payer: Medicare Other | Admitting: Pulmonary Disease

## 2020-04-07 VITALS — BP 126/72 | HR 68 | Temp 98.0°F | Ht 63.0 in | Wt 180.2 lb

## 2020-04-07 DIAGNOSIS — R0602 Shortness of breath: Secondary | ICD-10-CM

## 2020-04-07 DIAGNOSIS — K219 Gastro-esophageal reflux disease without esophagitis: Secondary | ICD-10-CM | POA: Diagnosis not present

## 2020-04-07 DIAGNOSIS — J4540 Moderate persistent asthma, uncomplicated: Secondary | ICD-10-CM

## 2020-04-07 MED ORDER — TRELEGY ELLIPTA 100-62.5-25 MCG/INH IN AEPB: 1.0000 | INHALATION_SPRAY | Freq: Every day | RESPIRATORY_TRACT | 0 refills | Status: AC

## 2020-04-07 NOTE — Patient Instructions (Signed)
I am going to give you a trial of Trelegy Ellipta this is a little stronger than the current medication you are on which is Breo.   Let us know if Trelegy works for you.  You only have 14 days worth of the medication.  DO NOT TAKE BREO WHILE TAKING THE TRELEGY.   We are going to see you in follow-up in 6 to 8 weeks time call sooner should any new problems arise.

## 2020-04-07 NOTE — Progress Notes (Signed)
Subjective:    Patient ID: Sarah [name], female    DOB: Nov 29, 1950, 69 y.o.   MRN: [id number]  HPI 69 year old lifelong never smoker prior patient of Dr. Ashby Dawes last seen by me on 09/26/2019, she had not followed since.  At that time she had been given a trial of Breo Ellipta and had noticed that this has been a "life saver for her".  However over the last several months she has noted some increasing dyspnea on exertion and increased need for use of her rescue inhaler.  She had a visit in the emergency room at [facility] on 11/27/2019 at that time, she had been having shortness of breath for a week and a half prior to that visit.  She had an asthma exacerbation at that time.  She was treated with a prednisone taper and instructed to use her rescue inhaler more frequently during the first few days.  She did not require antibiotics.  Chest x-ray at that time was clear.  COVID-19 testing negative.  She notes that steroids improved her symptoms significantly however she has gradually started having more need for her albuterol inhaler.  Has not had any recent fevers, chills or sweats.  Some dry cough but no sputum production.  She notes that her gastroesophageal reflux symptoms are well controlled with Protonix.  Pulmonary Functions Testing Results: Tests were performed 01/08/2019 FEV1 1.61 L or 73% predicted FVC 2.05 L or 70% predicted FEV1/FVC 79% predicted Postbronchodilator response on FEV1 and FVC was 13% ERV was 6% Diffusion capacity normal. This is a combined obstructive and restrictive defect.  The obstructive defect has reversibility consistent with the patient's asthma restrictive defect likely due to obesity (ERV 6%)   Review of Systems A 10 point review of systems was performed and it is as noted above otherwise negative.  Patient Active Problem List   Diagnosis Date Noted  . Educated about COVID-19 virus infection 04/10/2019  . Prediabetes 10/13/2018  . LVH (left  ventricular hypertrophy) due to hypertensive disease, without heart failure 10/13/2018  . Hospital discharge follow-up 07/08/2018  . Chest tightness 06/26/2018  . Asthma 06/04/2018  . SOB (shortness of breath) 04/08/2018  . Weight loss, unintentional 10/08/2017  . Hypothyroid 06/15/2017  . Post herpetic neuralgia 06/06/2017  . Encounter for preventive health examination 09/09/2014  . Esophageal reflux 09/09/2014  . Hyperlipidemia 08/17/2014  . Elevated blood pressure reading without diagnosis of hypertension 11/24/2013  . S/P hysterectomy 04/28/2013  . Initial Medicare annual wellness visit 04/28/2013  . Depression    No Known Allergies  Current Meds  Medication Sig  . albuterol (PROVENTIL HFA;VENTOLIN HFA) 108 (90 Base) MCG/ACT inhaler Inhale 2 puffs into the lungs every 6 (six) hours as needed for wheezing or shortness of breath.  Marland Kitchen BREO ELLIPTA 100-25 MCG/INH AEPB INHALE ONE PUFF INTO THE LUNGS EVERY DAY  . budesonide (PULMICORT) 0.25 MG/2ML nebulizer solution Take 2 mLs (0.25 mg total) by nebulization 2 (two) times daily.  . Calcium Carb-Cholecalciferol (CALCIUM 600 + D PO) Take 2 tablets by mouth daily.  . citalopram (CELEXA) 20 MG tablet TAKE ONE TABLET BY MOUTH EVERY DAY  . erythromycin ophthalmic ointment Apply to sutures 4 times a day for 10-12 days.  Discontinue if allergy develops and call our office  . famotidine (PEPCID) 20 MG tablet TAKE 1 TABLET BY MOUTH DAILY. BEFORE DINNER (Patient taking differently: Take 20 mg by mouth every evening.)  . fexofenadine (ALLEGRA) 180 MG tablet Take 180 mg by mouth  daily.  . fluticasone furoate-vilanterol (BREO ELLIPTA) 100-25 MCG/INH AEPB Inhale 1 puff into the lungs daily.  Marland Kitchen ibuprofen (ADVIL,MOTRIN) 200 MG tablet Take 600 mg by mouth daily as needed for moderate pain.  Marland Kitchen ipratropium-albuterol (DUONEB) 0.5-2.5 (3) MG/3ML SOLN Take 3 mLs by nebulization every 6 (six) hours as needed.  Marland Kitchen levothyroxine (SYNTHROID) 50 MCG tablet TAKE 1  TABLET EVERY DAY ON EMPTY STOMACHWITH A GLASS OF WATER AT LEAST 30-60 MINBEFORE BREAKFAST  . Omega-3 Fatty Acids (FISH OIL) 1200 MG CAPS Take 1,200 mg by mouth daily.  . pantoprazole (PROTONIX) 40 MG tablet TAKE ONE TABLET BY MOUTH EVERY MORNING 30 MIN BEFORE BREAKFAST  . traMADol (ULTRAM) 50 MG tablet Take 1 every 4-6 hours as needed for pain not controlled by Tylenol   Immunization History  Administered Date(s) Administered  . Fluad Quad(high Dose 65+) 12/06/2018  . Influenza, High Dose Seasonal PF 04/22/2016, 06/05/2017  . Influenza,inj,Quad PF,6+ Mos 04/26/2013, 01/30/2015  . Influenza-Unspecified 01/24/2018, 12/06/2018, 01/02/2020  . Moderna Sars-Covid-2 Vaccination 06/20/2019, 07/22/2019  . Pneumococcal Conjugate-13 09/01/2014, 01/24/2018  . Pneumococcal Polysaccharide-23 04/26/2013, 02/28/2019  . Tdap 06/18/2014  . Zoster Recombinat (Shingrix) 07/08/2017, 09/22/2017       Objective:   Physical Exam BP 126/72 (BP Location: Left Arm, Cuff Size: Normal)   Pulse 68   Temp 98 F (36.7 C) (Temporal)   Ht 5\' 3"  (1.6 m)   Wt 180 lb 3.2 oz (81.7 kg)   SpO2 96%   BMI 31.92 kg/m   GENERAL: Overweight woman, no acute distress. HEAD: Normocephalic, atraumatic.  EYES: Pupils equal, round, reactive to light.  No scleral icterus.  MOUTH: Nose/mouth/throat not examined due to masking requirements for COVID 19. NECK: Supple. No thyromegaly. Trachea midline. No JVD.  No adenopathy. PULMONARY: Lungs sound coarse but otherwise clear to auscultation bilaterally. CARDIOVASCULAR: S1 and S2. Regular rate and rhythm.  No rubs, murmurs gallops heard. GASTROINTESTINAL: Benign. MUSCULOSKELETAL: No joint deformity, no clubbing, no edema.  NEUROLOGIC: No overt focal deficits. SKIN: Intact,warm,dry. PSYCH: Mood and behavior appropriate.      Assessment & Plan:     ICD-10-CM   1. Moderate persistent asthma without complication  D74.12    Discontinue Breo Ellipta Start Trelegy Ellipta  100/62.5/25 1 puff daily Reassess with PFTs  2. Gastroesophageal reflux disease, unspecified whether esophagitis present  K21.9    Continue Protonix Continue antireflux measures  3. SOB (shortness of breath)  R06.02    Likely due to poorly compensated asthma Will need PFTs for more compensated   Meds ordered this encounter  Medications  . Fluticasone-Umeclidin-Vilant (TRELEGY ELLIPTA) 100-62.5-25 MCG/INH AEPB    Sig: Inhale 1 puff into the lungs daily for 1 day.    Dispense:  14 each    Refill:  0    Order Specific Question:   Lot Number?    Answer:   [id number]    Order Specific Question:   Expiration Date?    Answer:   09/23/2021    Order Specific Question:   Manufacturer?    Answer:   [employer] [[id number]]    Order Specific Question:   Quantity    Answer:   1   Discussion:  We will give the patient Trelegy Ellipta.  1 puff daily.  We will see her in 6 to 8 weeks time she is to contact us prior to that time should any new difficulties arise.  Will obtain PFTs once she is more compensated.  Renold Don, MD [facility]   *  This note was dictated using voice recognition software/Dragon.  Despite best efforts to proofread, errors can occur which can change the meaning.  Any change was purely unintentional.

## 2020-04-13 ENCOUNTER — Ambulatory Visit (INDEPENDENT_AMBULATORY_CARE_PROVIDER_SITE_OTHER): Payer: Medicare Other

## 2020-04-13 VITALS — Ht 63.0 in | Wt 180.0 lb

## 2020-04-13 DIAGNOSIS — Z0000 Encounter for general adult medical examination without abnormal findings: Secondary | ICD-10-CM

## 2020-04-13 NOTE — Progress Notes (Addendum)
Subjective:   Sarah [name] is a 69 y.o. female who presents for Medicare Annual (Subsequent) preventive examination.  Review of Systems    No ROS.  Medicare Wellness Virtual Visit.     Cardiac Risk Factors include: advanced age (>24men, >5 women)     Objective:    Today's Vitals   04/13/20 1037  Weight: 180 lb (81.6 kg)  Height: 5\' 3"  (1.6 m)   Body mass index is 31.89 kg/m.  Advanced Directives 04/13/2020 02/07/2020 04/11/2019 06/28/2018 06/28/2018 04/09/2018 10/11/2016  Does Patient Have a Medical Advance Directive? No No Yes Yes No Yes No  Type of Advance Directive - Public librarian;Living will Healthcare Power of [place];Living will -  Does patient want to make changes to medical advance directive? No - Patient declined - No - Patient declined No - Patient declined - No - Patient declined -  Copy of [place] in Chart? - - No - copy requested No - copy requested - No - copy requested -  Would patient like information on creating a medical advance directive? - No - Patient declined - No - Patient declined No - Patient declined - No - Patient declined    Current Medications (verified) Outpatient Encounter Medications as of 04/13/2020  Medication Sig   albuterol (PROVENTIL HFA;VENTOLIN HFA) 108 (90 Base) MCG/ACT inhaler Inhale 2 puffs into the lungs every 6 (six) hours as needed for wheezing or shortness of breath.   BREO ELLIPTA 100-25 MCG/INH AEPB INHALE ONE PUFF INTO THE LUNGS EVERY DAY   budesonide (PULMICORT) 0.25 MG/2ML nebulizer solution Take 2 mLs (0.25 mg total) by nebulization 2 (two) times daily.   Calcium Carb-Cholecalciferol (CALCIUM 600 + D PO) Take 2 tablets by mouth daily.   citalopram (CELEXA) 20 MG tablet TAKE ONE TABLET BY MOUTH EVERY DAY   erythromycin ophthalmic ointment Apply to sutures 4 times a day for 10-12 days.  Discontinue if allergy develops and call our office   famotidine  (PEPCID) 20 MG tablet TAKE 1 TABLET BY MOUTH DAILY. BEFORE DINNER (Patient taking differently: Take 20 mg by mouth every evening.)   fexofenadine (ALLEGRA) 180 MG tablet Take 180 mg by mouth daily.   fluticasone furoate-vilanterol (BREO ELLIPTA) 100-25 MCG/INH AEPB Inhale 1 puff into the lungs daily.   ibuprofen (ADVIL,MOTRIN) 200 MG tablet Take 600 mg by mouth daily as needed for moderate pain.   ipratropium-albuterol (DUONEB) 0.5-2.5 (3) MG/3ML SOLN Take 3 mLs by nebulization every 6 (six) hours as needed.   levothyroxine (SYNTHROID) 50 MCG tablet TAKE 1 TABLET EVERY DAY ON EMPTY STOMACHWITH A GLASS OF WATER AT LEAST 30-60 MINBEFORE BREAKFAST   Omega-3 Fatty Acids (FISH OIL) 1200 MG CAPS Take 1,200 mg by mouth daily.   pantoprazole (PROTONIX) 40 MG tablet TAKE ONE TABLET BY MOUTH EVERY MORNING 30 MIN BEFORE BREAKFAST   traMADol (ULTRAM) 50 MG tablet Take 1 every 4-6 hours as needed for pain not controlled by Tylenol   No facility-administered encounter medications on file as of 04/13/2020.    Allergies (verified) Patient has no known allergies.   History: Past Medical History:  Diagnosis Date   Arthritis    Asthma    Depression    Dyspnea    with heat   Environmental and seasonal allergies    GERD (gastroesophageal reflux disease)    Presence of dental prosthetic device    Dental implants   Wheezing    Past Surgical  History:  Procedure Laterality Date   ABDOMINAL HYSTERECTOMY  1989   BROW LIFT Bilateral 02/07/2020   Procedure: BLEPHAROPLASTY UPPER EYELID; W/EXCESS SKIN BROW PTOSIS REPAIR BILATERAL;  Surgeon: Karle Starch, MD;  Location: [place];  Service: Ophthalmology;  Laterality: Bilateral;   CATARACT EXTRACTION W/PHACO Right 09/13/2016   Procedure: CATARACT EXTRACTION PHACO AND INTRAOCULAR LENS PLACEMENT (IOC);  Surgeon: Birder Robson, MD;  Location: [facility];  Service: Ophthalmology;  Laterality: Right;  [place] 00:38 AP% 14.3 CDE 5.51 Fluid pack lot #  [id number]   CATARACT EXTRACTION W/PHACO Left 10/11/2016   Procedure: CATARACT EXTRACTION PHACO AND INTRAOCULAR LENS PLACEMENT (IOC);  Surgeon: Birder Robson, MD;  Location: [facility];  Service: Ophthalmology;  Laterality: Left;  [place] 00:30 AP% 11.3 CDE 3.50 fluid pack lot # [id number]   COLONOSCOPY WITH PROPOFOL N/A 09/30/2014   Procedure: COLONOSCOPY WITH PROPOFOL;  Surgeon: Robert Bellow, MD;  Location: [facility];  Service: Endoscopy;  Laterality: N/A;   ESOPHAGOGASTRODUODENOSCOPY N/A 09/30/2014   Procedure: ESOPHAGOGASTRODUODENOSCOPY (EGD);  Surgeon: Robert Bellow, MD;  Location: [facility];  Service: Endoscopy;  Laterality: N/A;   FOOT SURGERY Left 1985   KNEE SURGERY  2016   Family History  Problem Relation Age of Onset   Diabetes Mother    Hyperlipidemia Mother    Heart disease Mother    Social History   Socioeconomic History   Marital status: Single    Spouse name: Not on file   Number of children: Not on file   Years of education: Not on file   Highest education level: Not on file  Occupational History   Not on file  Tobacco Use   Smoking status: Never Smoker   Smokeless tobacco: Never Used  Vaping Use   Vaping Use: Never used  Substance and Sexual Activity   Alcohol use: No   Drug use: No   Sexual activity: Never  Other Topics Concern   Not on file  Social History Narrative   Not on file   Social Determinants of Health   Financial Resource Strain: Low Risk    Difficulty of Paying Living Expenses: Not hard at all  Food Insecurity: No Food Insecurity   Worried About Charity fundraiser in the Last Year: Never true   [place] in the Last Year: Never true  Transportation Needs: No Transportation Needs   Lack of Transportation (Medical): No   Lack of Transportation (Non-Medical): No  Physical Activity: Unknown   Days of Exercise per Week: 0 days   Minutes of Exercise per Session: Not on file  Stress: No Stress Concern Present   Feeling of  Stress : Not at all  Social Connections: Unknown   Frequency of Communication with Friends and Family: More than three times a week   Frequency of Social Gatherings with Friends and Family: More than three times a week   Attends Religious Services: More than 4 times per year   Active Member of [employer] or Organizations: Yes   Attends Music therapist: More than 4 times per year   Marital Status: Not on file    Tobacco Counseling Counseling given: Not Answered   Clinical Intake:  Pre-visit preparation completed: Yes        Diabetes: No  How often do you need to have someone help you when you read instructions, pamphlets, or other written materials from your doctor or pharmacy?: 1 - Never   Interpreter Needed?: No  Activities of Daily Living In your present state of health, do you have any difficulty performing the following activities: 04/13/2020 02/07/2020  Hearing? N N  Vision? N N  Difficulty concentrating or making decisions? N N  Walking or climbing stairs? N N  Dressing or bathing? N N  Doing errands, shopping? N -  Preparing Food and eating ? N -  Using the Toilet? N -  In the past six months, have you accidently leaked urine? Y -  Comment Managed with daily liner -  Do you have problems with loss of bowel control? N -  Managing your Medications? N -  Managing your Finances? N -  Housekeeping or managing your Housekeeping? N -  Some recent data might be hidden    Patient Care Team: Crecencio Mc, MD as PCP - General (Internal Medicine) Crecencio Mc, MD (Internal Medicine) Bary Castilla Forest Gleason, MD (General Surgery)  Indicate any recent Medical Services you may have received from other than [facility] providers in the past year (date may be approximate).     Assessment:   This is a routine wellness examination for Shoua.  I connected with Jazelyn today by telephone and verified that I am speaking with the correct person using two  identifiers. Location patient: home Location provider: work Persons participating in the virtual visit: patient, Marine scientist.    I discussed the limitations, risks, security and privacy concerns of performing an evaluation and management service by telephone and the availability of in person appointments. The patient expressed understanding and verbally consented to this telephonic visit.    Interactive audio and video telecommunications were attempted between this provider and patient, however failed, due to patient having technical difficulties OR patient did not have access to video capability.  We continued and completed visit with audio only.  Some vital signs may be absent or patient reported.   Hearing/Vision screen  Hearing Screening   125Hz  250Hz  500Hz  1000Hz  2000Hz  3000Hz  4000Hz  6000Hz  8000Hz   Right ear:           Left ear:           Comments: Patient is able to hear conversational tones without difficulty.  No issues reported.  Vision Screening Comments: Wears corrective lenses  Cataract extraction, bilateral  Visual acuity not assessed, virtual visit. They have seen their ophthalmologist.   Dietary issues and exercise activities discussed: Current Exercise Habits: The patient does not participate in regular exercise at present  Regular diet Good fluid intake  Goals       Patient Stated     DIET - REDUCE SUGAR INTAKE (pt-stated)      Low Carb Diet Portion control Healthy choices      Increase physical activity (pt-stated)      Walk for exercise       Depression Screen PHQ 2/9 Scores 04/13/2020 04/11/2019 04/09/2018 06/05/2017 04/22/2016 09/01/2014 08/15/2014  PHQ - 2 Score 0 0 1 0 0 1 0  PHQ- 9 Score - - - 0 - - -    Fall Risk Fall Risk  04/13/2020 04/11/2019 04/10/2019 04/06/2018 04/22/2016  Falls in the past year? 0 0 0 0 No  Number falls in past yr: 0 0 0 - -  Comment - - - - -  Injury with Fall? 0 0 0 - -  Comment - - - - -  Risk for fall due to : - - - - -   Follow up Falls evaluation completed Falls prevention discussed Falls evaluation  completed - -    FALL RISK PREVENTION PERTAINING TO THE HOME: Handrails in use when climbing stairs? Yes Home free of loose throw rugs in walkways, pet beds, electrical cords, etc? Yes  Adequate lighting in your home to reduce risk of falls? Yes   ASSISTIVE DEVICES UTILIZED TO PREVENT FALLS: Life alert? No  Use of a cane, walker or w/c? No  Grab bars in the bathroom? Yes  Shower chair or bench in shower? No  Elevated toilet seat or a handicapped toilet? Yes   TIMED UP AND GO: Was the test performed? No. Virtual visit.    Cognitive Function: MMSE - Mini Mental State Exam 04/09/2018 09/01/2014  Orientation to time 4 5  Orientation to time comments Difficulty drawing clock face with time.  -  Orientation to Place 5 5  Registration 3 3  Attention/ Calculation 2 5  Attention/Calculation-comments Difficulty with calculation -  Recall 1 3  Recall-comments 1 out of 3 recall -  Language- name 2 objects 2 2  Language- repeat 1 1  Language- follow 3 step command 3 3  Language- read & follow direction 1 1  Write a sentence 0 1  Write a sentence-comments Difficulty completing sentence.  -  Copy design 1 1  Total score 23 30     6CIT Screen 04/13/2020 04/11/2019  What Year? 0 points 0 points  What month? 0 points 0 points  What time? 0 points 0 points  Count back from 20 0 points 0 points  Months in reverse 0 points 0 points  Repeat phrase 4 points 0 points  Total Score 4 0    Immunizations Immunization History  Administered Date(s) Administered   Fluad Quad(high Dose 65+) 12/06/2018   Influenza, High Dose Seasonal PF 04/22/2016, 06/05/2017   Influenza,inj,Quad PF,6+ Mos 04/26/2013, 01/30/2015   Influenza-Unspecified 01/24/2018, 12/06/2018, 01/02/2020   Moderna Sars-Covid-2 Vaccination 06/20/2019, 07/22/2019   Pneumococcal Conjugate-13 09/01/2014, 01/24/2018   Pneumococcal Polysaccharide-23  04/26/2013, 02/28/2019   Tdap 06/18/2014   Zoster Recombinat (Shingrix) 07/08/2017, 09/22/2017    Health Maintenance Health Maintenance  Topic Date Due   COVID-19 Vaccine (3 - Booster for Moderna series) 01/22/2020   MAMMOGRAM  11/07/2020   TETANUS/TDAP  06/18/2024   COLONOSCOPY  09/29/2024   INFLUENZA VACCINE  Completed   DEXA SCAN  Completed   Hepatitis C Screening  Completed   PNA vac Low Risk Adult  Completed    Colorectal cancer screening: Type of screening: Colonoscopy. Completed 09/30/14. Repeat every 10 years  Mammogram status: Completed 11/08/18. Repeat every year. Next scheduled 04/29/20.   Bone Density- 09/30/14.   Lung Cancer Screening: (Low Dose CT Chest recommended if Age 53-80 years, 30 pack-year currently smoking OR have quit w/in 15years.) does not qualify.   Hepatitis C Screening: Completed 07/30/15. Calcium Carb-Cholecalciferol (CALCIUM 600 + D PO)  Vision Screening: Recommended annual ophthalmology exams for early detection of glaucoma and other disorders of the eye. Is the patient up to date with their annual eye exam?  Yes  Who is the provider or what is the name of the office in which the patient attends annual eye exams? [place]  Dental Screening: Recommended annual dental exams for proper oral hygiene.  Community Resource Referral / Chronic Care Management: CRR required this visit?  No   CCM required this visit?  No      Plan:   Keep all routine maintenance appointments.   Follow up 05/13/20 @ 8:00  I have personally reviewed and  noted the following in the patient's chart:   Medical and social history Use of alcohol, tobacco or illicit drugs  Current medications and supplements Functional ability and status Nutritional status Physical activity Advanced directives List of other physicians Hospitalizations, surgeries, and ER visits in previous 12 months Vitals Screenings to include cognitive, depression, and falls Referrals and  appointments  In addition, I have reviewed and discussed with patient certain preventive protocols, quality metrics, and best practice recommendations. A written personalized care plan for preventive services as well as general preventive health recommendations were provided to patient via mail.     OBrien-Blaney, Rajendra Spiller L, LPN   [id number]    I have reviewed the above information and agree with above.   Deborra Medina, MD    I have reviewed the above information and agree with above.   Deborra Medina, MD

## 2020-04-13 NOTE — Patient Instructions (Addendum)
Sarah [name] , Thank you for taking time to come for your Medicare Wellness Visit. I appreciate your ongoing commitment to your health goals. Please review the following plan we discussed and let me know if I can assist you in the future.   These are the goals we discussed: Goals      Patient Stated   .  DIET - REDUCE SUGAR INTAKE (pt-stated)      Low Carb Diet Portion control Healthy choices    .  Increase physical activity (pt-stated)      Walk for exercise       This is a list of the screening recommended for you and due dates:  Health Maintenance  Topic Date Due  . COVID-19 Vaccine (3 - Booster for Moderna series) 01/22/2020  . Mammogram  11/07/2020  . Tetanus Vaccine  06/18/2024  . Colon Cancer Screening  09/29/2024  . Flu Shot  Completed  . DEXA scan (bone density measurement)  Completed  .  Hepatitis C: One time screening is recommended by Center for Disease Control  (CDC) for  adults born from [birth year] through 1966.   Completed  . Pneumonia vaccines  Completed    Immunizations Immunization History  Administered Date(s) Administered  . Fluad Quad(high Dose 65+) 12/06/2018  . Influenza, High Dose Seasonal PF 04/22/2016, 06/05/2017  . Influenza,inj,Quad PF,6+ Mos 04/26/2013, 01/30/2015  . Influenza-Unspecified 01/24/2018, 12/06/2018, 01/02/2020  . Moderna Sars-Covid-2 Vaccination 06/20/2019, 07/22/2019  . Pneumococcal Conjugate-13 09/01/2014, 01/24/2018  . Pneumococcal Polysaccharide-23 04/26/2013, 02/28/2019  . Tdap 06/18/2014  . Zoster Recombinat (Shingrix) 07/08/2017, 09/22/2017   Keep all routine maintenance appointments.   Follow up 05/13/20 @ 8:00  Advanced directives: Not yet completed.  Conditions/risks identified: None new.  Follow up in one year for your annual wellness visit.   Preventive Care 48 Years and Older, Female Preventive care refers to lifestyle choices and visits with your health care provider that can promote health and wellness. What  does preventive care include?  A yearly physical exam. This is also called an annual well check.  Dental exams once or twice a year.  Routine eye exams. Ask your health care provider how often you should have your eyes checked.  Personal lifestyle choices, including:  Daily care of your teeth and gums.  Regular physical activity.  Eating a healthy diet.  Avoiding tobacco and drug use.  Limiting alcohol use.  Practicing safe sex.  Taking low-dose aspirin every day.  Taking vitamin and mineral supplements as recommended by your health care provider. What happens during an annual well check? The services and screenings done by your health care provider during your annual well check will depend on your age, overall health, lifestyle risk factors, and family history of disease. Counseling  Your health care provider may ask you questions about your:  Alcohol use.  Tobacco use.  Drug use.  Emotional well-being.  Home and relationship well-being.  Sexual activity.  Eating habits.  History of falls.  Memory and ability to understand (cognition).  Work and work Statistician.  Reproductive health. Screening  You may have the following tests or measurements:  Height, weight, and BMI.  Blood pressure.  Lipid and cholesterol levels. These may be checked every 5 years, or more frequently if you are over 51 years old.  Skin check.  Lung cancer screening. You may have this screening every year starting at age 65 if you have a 30-pack-year history of smoking and currently smoke or have quit within the past  15 years.  Fecal occult blood test (FOBT) of the stool. You may have this test every year starting at age 43.  Flexible sigmoidoscopy or colonoscopy. You may have a sigmoidoscopy every 5 years or a colonoscopy every 10 years starting at age 63.  Hepatitis C blood test.  Hepatitis B blood test.  Sexually transmitted disease (STD) testing.  Diabetes screening.  This is done by checking your blood sugar (glucose) after you have not eaten for a while (fasting). You may have this done every 1-3 years.  Bone density scan. This is done to screen for osteoporosis. You may have this done starting at age 11.  Mammogram. This may be done every 1-2 years. Talk to your health care provider about how often you should have regular mammograms. Talk with your health care provider about your test results, treatment options, and if necessary, the need for more tests. Vaccines  Your health care provider may recommend certain vaccines, such as:  Influenza vaccine. This is recommended every year.  Tetanus, diphtheria, and acellular pertussis (Tdap, Td) vaccine. You may need a Td booster every 10 years.  Zoster vaccine. You may need this after age 70.  Pneumococcal 13-valent conjugate (PCV13) vaccine. One dose is recommended after age 67.  Pneumococcal polysaccharide (PPSV23) vaccine. One dose is recommended after age 3. Talk to your health care provider about which screenings and vaccines you need and how often you need them. This information is not intended to replace advice given to you by your health care provider. Make sure you discuss any questions you have with your health care provider. Document Released: 05/08/2015 Document Revised: 12/30/2015 Document Reviewed: 02/10/2015 Elsevier Interactive Patient Education  2017 [place] Prevention in the Home Falls can cause injuries. They can happen to people of all ages. There are many things you can do to make your home safe and to help prevent falls. What can I do on the outside of my home?  Regularly fix the edges of walkways and driveways and fix any cracks.  Remove anything that might make you trip as you walk through a door, such as a raised step or threshold.  Trim any bushes or trees on the path to your home.  Use bright outdoor lighting.  Clear any walking paths of anything that might  make someone trip, such as rocks or tools.  Regularly check to see if handrails are loose or broken. Make sure that both sides of any steps have handrails.  Any raised decks and porches should have guardrails on the edges.  Have any leaves, snow, or ice cleared regularly.  Use sand or salt on walking paths during winter.  Clean up any spills in your garage right away. This includes oil or grease spills. What can I do in the bathroom?  Use night lights.  Install grab bars by the toilet and in the tub and shower. Do not use towel bars as grab bars.  Use non-skid mats or decals in the tub or shower.  If you need to sit down in the shower, use a plastic, non-slip stool.  Keep the floor dry. Clean up any water that spills on the floor as soon as it happens.  Remove soap buildup in the tub or shower regularly.  Attach bath mats securely with double-sided non-slip rug tape.  Do not have throw rugs and other things on the floor that can make you trip. What can I do in the bedroom?  Use night lights.  Make sure that you have a light by your bed that is easy to reach.  Do not use any sheets or blankets that are too big for your bed. They should not hang down onto the floor.  Have a firm chair that has side arms. You can use this for support while you get dressed.  Do not have throw rugs and other things on the floor that can make you trip. What can I do in the kitchen?  Clean up any spills right away.  Avoid walking on wet floors.  Keep items that you use a lot in easy-to-reach places.  If you need to reach something above you, use a strong step stool that has a grab bar.  Keep electrical cords out of the way.  Do not use floor polish or wax that makes floors slippery. If you must use wax, use non-skid floor wax.  Do not have throw rugs and other things on the floor that can make you trip. What can I do with my stairs?  Do not leave any items on the stairs.  Make sure  that there are handrails on both sides of the stairs and use them. Fix handrails that are broken or loose. Make sure that handrails are as long as the stairways.  Check any carpeting to make sure that it is firmly attached to the stairs. Fix any carpet that is loose or worn.  Avoid having throw rugs at the top or bottom of the stairs. If you do have throw rugs, attach them to the floor with carpet tape.  Make sure that you have a light switch at the top of the stairs and the bottom of the stairs. If you do not have them, ask someone to add them for you. What else can I do to help prevent falls?  Wear shoes that:  Do not have high heels.  Have rubber bottoms.  Are comfortable and fit you well.  Are closed at the toe. Do not wear sandals.  If you use a stepladder:  Make sure that it is fully opened. Do not climb a closed stepladder.  Make sure that both sides of the stepladder are locked into place.  Ask someone to hold it for you, if possible.  Clearly mark and make sure that you can see:  Any grab bars or handrails.  First and last steps.  Where the edge of each step is.  Use tools that help you move around (mobility aids) if they are needed. These include:  Canes.  Walkers.  Scooters.  Crutches.  Turn on the lights when you go into a dark area. Replace any light bulbs as soon as they burn out.  Set up your furniture so you have a clear path. Avoid moving your furniture around.  If any of your floors are uneven, fix them.  If there are any pets around you, be aware of where they are.  Review your medicines with your doctor. Some medicines can make you feel dizzy. This can increase your chance of falling. Ask your doctor what other things that you can do to help prevent falls. This information is not intended to replace advice given to you by your health care provider. Make sure you discuss any questions you have with your health care provider. Document  Released: 02/05/2009 Document Revised: 09/17/2015 Document Reviewed: 05/16/2014 Elsevier Interactive Patient Education  2017 [employer].

## 2020-04-22 ENCOUNTER — Other Ambulatory Visit: Payer: Self-pay | Admitting: Internal Medicine

## 2020-04-29 ENCOUNTER — Other Ambulatory Visit: Payer: Self-pay

## 2020-04-29 ENCOUNTER — Ambulatory Visit
Admission: RE | Admit: 2020-04-29 | Discharge: 2020-04-29 | Disposition: A | Discharge status: Home / Self Care | Payer: Medicare Other | Source: Ambulatory Visit | Attending: Internal Medicine | Admitting: Internal Medicine

## 2020-04-29 DIAGNOSIS — Z1231 Encounter for screening mammogram for malignant neoplasm of breast: Secondary | ICD-10-CM | POA: Insufficient documentation

## 2020-04-30 ENCOUNTER — Telehealth: Payer: Self-pay | Admitting: Pulmonary Disease

## 2020-04-30 MED ORDER — TRELEGY ELLIPTA 100-62.5-25 MCG/INH IN AEPB: 1.0000 | INHALATION_SPRAY | Freq: Every day | RESPIRATORY_TRACT | 11 refills | Status: AC

## 2020-04-30 NOTE — Telephone Encounter (Signed)
Spoke to patient via telephone.  She is requesting Rx for Trelegy, as she feels that this medication is effective.  Rx has been sent to preferred pharmacy.  Nothing further needed.

## 2020-05-13 ENCOUNTER — Ambulatory Visit: Payer: Medicare Other | Admitting: Internal Medicine

## 2020-05-19 ENCOUNTER — Ambulatory Visit: Payer: Medicare Other | Admitting: Pulmonary Disease

## 2020-05-19 ENCOUNTER — Encounter: Payer: Self-pay | Admitting: Pulmonary Disease

## 2020-05-19 ENCOUNTER — Other Ambulatory Visit: Payer: Self-pay

## 2020-05-19 VITALS — BP 120/78 | HR 81 | Temp 97.8°F | Ht 63.0 in | Wt 178.7 lb

## 2020-05-19 DIAGNOSIS — K219 Gastro-esophageal reflux disease without esophagitis: Secondary | ICD-10-CM

## 2020-05-19 DIAGNOSIS — J4540 Moderate persistent asthma, uncomplicated: Secondary | ICD-10-CM

## 2020-05-19 DIAGNOSIS — R0602 Shortness of breath: Secondary | ICD-10-CM | POA: Diagnosis not present

## 2020-05-19 MED ORDER — TRELEGY ELLIPTA 100-62.5-25 MCG/INH IN AEPB: 1.0000 | INHALATION_SPRAY | Freq: Every day | RESPIRATORY_TRACT | 0 refills | Status: AC

## 2020-05-19 NOTE — Progress Notes (Incomplete)
   Subjective:    Patient ID: Sarah [name], female    DOB: 01/23/1951, 70 y.o.   MRN: [id number]  HPI    Review of Systems     Objective:   Physical Exam        Assessment & Plan:

## 2020-05-19 NOTE — Patient Instructions (Signed)
We are going to get breathing tests  We will see him in follow-up in 4 months time call sooner should any new problems arise

## 2020-05-19 NOTE — Progress Notes (Signed)
Subjective:    Patient ID: Sarah [name], female    DOB: 07-04-1950, 70 y.o.   MRN: [id number]  HPI Patient is a 70 year old lifelong never smoker with a history of moderate persistent asthma who follows after recent visit of December 14 in which she noted some slight increase shortness of breath and need for use of rescue inhaler.  She has been given a trial of Trelegy Ellipta 100/62.5/25 and notices improvement with this medication.  She has issues with gastroesophageal reflux but has been controlled with Protonix.  Since she started the Trelegy her dyspnea is markedly improved.  She seems to be doing well on this regimen.  She has not had to use her rescue inhaler.  She voices no other complaint today she feels well and looks well.  Review of Systems A 10 point review of systems was performed and it is as noted above otherwise negative.  Patient Active Problem List   Diagnosis Date Noted  . Educated about COVID-19 virus infection 04/10/2019  . Prediabetes 10/13/2018  . LVH (left ventricular hypertrophy) due to hypertensive disease, without heart failure 10/13/2018  . Hospital discharge follow-up 07/08/2018  . Chest tightness 06/26/2018  . Asthma 06/04/2018  . SOB (shortness of breath) 04/08/2018  . Weight loss, unintentional 10/08/2017  . Hypothyroid 06/15/2017  . Post herpetic neuralgia 06/06/2017  . Encounter for preventive health examination 09/09/2014  . Esophageal reflux 09/09/2014  . Hyperlipidemia 08/17/2014  . Elevated blood pressure reading without diagnosis of hypertension 11/24/2013  . S/P hysterectomy 04/28/2013  . Initial Medicare annual wellness visit 04/28/2013  . Depression    No Known Allergies  Current Meds  Medication Sig  . albuterol (PROVENTIL HFA;VENTOLIN HFA) 108 (90 Base) MCG/ACT inhaler Inhale 2 puffs into the lungs every 6 (six) hours as needed for wheezing or shortness of breath.  . Calcium Carb-Cholecalciferol (CALCIUM 600 + D PO) Take 2 tablets  by mouth daily.  . citalopram (CELEXA) 20 MG tablet TAKE ONE TABLET BY MOUTH EVERY DAY  . erythromycin ophthalmic ointment Apply to sutures 4 times a day for 10-12 days.  Discontinue if allergy develops and call our office  . famotidine (PEPCID) 20 MG tablet TAKE 1 TABLET BY MOUTH DAILY. BEFORE DINNER (Patient taking differently: Take 20 mg by mouth every evening.)  . fexofenadine (ALLEGRA) 180 MG tablet Take 180 mg by mouth daily.  . Fluticasone-Umeclidin-Vilant (TRELEGY ELLIPTA) 100-62.5-25 MCG/INH AEPB Inhale 1 puff into the lungs daily.  . [EXPIRED] Fluticasone-Umeclidin-Vilant (TRELEGY ELLIPTA) 100-62.5-25 MCG/INH AEPB Inhale 1 puff into the lungs daily for 1 day.  . ibuprofen (ADVIL,MOTRIN) 200 MG tablet Take 600 mg by mouth daily as needed for moderate pain.  Marland Kitchen ipratropium-albuterol (DUONEB) 0.5-2.5 (3) MG/3ML SOLN Take 3 mLs by nebulization every 6 (six) hours as needed.  Marland Kitchen levothyroxine (SYNTHROID) 50 MCG tablet TAKE 1 TABLET EVERY DAY ON EMPTY STOMACHWITH A GLASS OF WATER AT LEAST 30-60 MINBEFORE BREAKFAST  . pantoprazole (PROTONIX) 40 MG tablet TAKE ONE TABLET BY MOUTH EVERY MORNING 30 MIN BEFORE BREAKFAST  . traMADol (ULTRAM) 50 MG tablet Take 1 every 4-6 hours as needed for pain not controlled by Tylenol  . [DISCONTINUED] BREO ELLIPTA 100-25 MCG/INH AEPB INHALE ONE PUFF INTO THE LUNGS EVERY DAY   Immunization History  Administered Date(s) Administered  . Fluad Quad(high Dose 65+) 12/06/2018  . Influenza, High Dose Seasonal PF 04/22/2016, 06/05/2017  . Influenza,inj,Quad PF,6+ Mos 04/26/2013, 01/30/2015  . Influenza-Unspecified 01/24/2018, 12/06/2018, 01/02/2020  . Moderna Sars-Covid-2 Vaccination 06/20/2019,  07/22/2019  . Pneumococcal Conjugate-13 09/01/2014, 01/24/2018  . Pneumococcal Polysaccharide-23 04/26/2013, 02/28/2019  . Tdap 06/18/2014  . Zoster Recombinat (Shingrix) 07/08/2017, 09/22/2017      Objective:   Physical Exam BP 120/78 (BP Location: Left Arm, Cuff Size:  Normal)   Pulse 81   Temp 97.8 F (36.6 C) (Temporal)   Ht 5\' 3"  (1.6 m)   Wt 178 lb 11.2 oz (81.1 kg)   SpO2 96%   BMI 31.66 kg/m  GENERAL: Overweight woman, no acute distress. HEAD: Normocephalic, atraumatic.  EYES: Pupils equal, round, reactive to light. No scleral icterus.  MOUTH: Nose/mouth/throat not examined due to masking requirements for COVID 19. NECK: Supple. No thyromegaly. Trachea midline. No JVD. No adenopathy. PULMONARY: Lungs sound coarse but otherwise clear to auscultation bilaterally. CARDIOVASCULAR: S1 and S2. Regular rate and rhythm. No rubs,murmurs gallops heard. GASTROINTESTINAL: Benign. MUSCULOSKELETAL: No joint deformity, no clubbing, no edema.  NEUROLOGIC: No overt focal deficits. SKIN: Intact,warm,dry. PSYCH: Mood and behavior appropriate.     Assessment & Plan:     ICD-10-CM   1. Moderate persistent asthma without complication  W11.91 Pulmonary Function Test [facility] Only   Appears to have been doing better on Trelegy Ellipta Continue Trelegy Ellipta PFTs to reassess Follow-up 4 months  2. Gastroesophageal reflux disease, unspecified whether esophagitis present  K21.9    Currently well controlled on Protonix  3. SOB (shortness of breath)  R06.02    Markedly improved on Trelegy PFTs as above   Orders Placed This Encounter  Procedures  . Pulmonary Function Test [facility] Only    Standing Status:   Future    Standing Expiration Date:   05/19/2021    Order Specific Question:   Full PFT: includes the following: basic spirometry, spirometry pre & post bronchodilator, diffusion capacity (DLCO), lung volumes    Answer:   Full PFT   Meds ordered this encounter  Medications  . Fluticasone-Umeclidin-Vilant (TRELEGY ELLIPTA) 100-62.5-25 MCG/INH AEPB    Sig: Inhale 1 puff into the lungs daily for 1 day.    Dispense:  14 each    Refill:  0    Order Specific Question:   Lot Number?    Answer:   [id number]    Order Specific Question:   Expiration Date?    Answer:    12/24/2021    Order Specific Question:   Manufacturer?    Answer:   [employer] [[id number]]    Order Specific Question:   Quantity    Answer:   1   Discussion:  The patient has moderate persistent asthma, we will order PFTs to reassess her status.  Continue Trelegy Ellipta.  In follow-up in 4 months time she is to contact us prior to that time should any new difficulties arise.  Renold Don, MD [facility]   *This note was dictated using voice recognition software/Dragon.  Despite best efforts to proofread, errors can occur which can change the meaning.  Any change was purely unintentional.

## 2020-05-22 ENCOUNTER — Encounter: Payer: Self-pay | Admitting: Pulmonary Disease

## 2020-05-25 ENCOUNTER — Ambulatory Visit: Payer: Medicare Other | Admitting: Internal Medicine

## 2020-06-12 ENCOUNTER — Encounter: Payer: Self-pay | Admitting: Internal Medicine

## 2020-06-12 ENCOUNTER — Other Ambulatory Visit: Payer: Self-pay

## 2020-06-12 ENCOUNTER — Telehealth: Payer: Self-pay

## 2020-06-12 ENCOUNTER — Ambulatory Visit (INDEPENDENT_AMBULATORY_CARE_PROVIDER_SITE_OTHER): Payer: Medicare Other | Admitting: Internal Medicine

## 2020-06-12 VITALS — BP 128/76 | HR 75 | Temp 99.3°F | Resp 16 | Ht 63.0 in | Wt 177.8 lb

## 2020-06-12 DIAGNOSIS — R7303 Prediabetes: Secondary | ICD-10-CM

## 2020-06-12 DIAGNOSIS — E039 Hypothyroidism, unspecified: Secondary | ICD-10-CM

## 2020-06-12 DIAGNOSIS — F329 Major depressive disorder, single episode, unspecified: Secondary | ICD-10-CM | POA: Diagnosis not present

## 2020-06-12 DIAGNOSIS — J4540 Moderate persistent asthma, uncomplicated: Secondary | ICD-10-CM | POA: Diagnosis not present

## 2020-06-12 DIAGNOSIS — E785 Hyperlipidemia, unspecified: Secondary | ICD-10-CM | POA: Diagnosis not present

## 2020-06-12 DIAGNOSIS — Z0000 Encounter for general adult medical examination without abnormal findings: Secondary | ICD-10-CM

## 2020-06-12 DIAGNOSIS — R0789 Other chest pain: Secondary | ICD-10-CM

## 2020-06-12 DIAGNOSIS — G629 Polyneuropathy, unspecified: Secondary | ICD-10-CM

## 2020-06-12 LAB — LIPID PANEL
Cholesterol: 221 mg/dL — ABNORMAL HIGH (ref 0–200)
HDL: 65.5 mg/dL (ref >39.00)
LDL Cholesterol: 143 mg/dL — ABNORMAL HIGH (ref 0–99)
NonHDL: 155.5
Total CHOL/HDL Ratio: 3
Triglycerides: 63 mg/dL (ref 0.0–149.0)
VLDL: 12.6 mg/dL (ref 0.0–40.0)

## 2020-06-12 LAB — CBC WITH DIFFERENTIAL/PLATELET
Basophils Absolute: 0.1 10*3/uL (ref 0.0–0.1)
Basophils Relative: 1.6 % (ref 0.0–3.0)
Eosinophils Absolute: 0.2 10*3/uL (ref 0.0–0.7)
Eosinophils Relative: 3.9 % (ref 0.0–5.0)
HCT: 35.1 % — ABNORMAL LOW (ref 36.0–46.0)
Hemoglobin: 11.9 g/dL — ABNORMAL LOW (ref 12.0–15.0)
Lymphocytes Relative: 28.2 % (ref 12.0–46.0)
Lymphs Abs: 1.6 10*3/uL (ref 0.7–4.0)
MCHC: 33.8 g/dL (ref 30.0–36.0)
MCV: 98.1 fl (ref 78.0–100.0)
Monocytes Absolute: 0.4 10*3/uL (ref 0.1–1.0)
Monocytes Relative: 7.5 % (ref 3.0–12.0)
Neutro Abs: 3.4 10*3/uL (ref 1.4–7.7)
Neutrophils Relative %: 58.8 % (ref 43.0–77.0)
Platelets: 145 10*3/uL — ABNORMAL LOW (ref 150.0–400.0)
RBC: 3.58 Mil/uL — ABNORMAL LOW (ref 3.87–5.11)
RDW: 14.2 % (ref 11.5–15.5)
WBC: 5.8 10*3/uL (ref 4.0–10.5)

## 2020-06-12 LAB — COMPREHENSIVE METABOLIC PANEL
ALT: 13 U/L (ref 0–35)
AST: 11 U/L (ref 0–37)
Albumin: 4.1 g/dL (ref 3.5–5.2)
Alkaline Phosphatase: 66 U/L (ref 39–117)
BUN: 20 mg/dL (ref 6–23)
CO2: 31 mEq/L (ref 19–32)
Calcium: 9 mg/dL (ref 8.4–10.5)
Chloride: 103 mEq/L (ref 96–112)
Creatinine, Ser: 0.94 mg/dL (ref 0.40–1.20)
GFR: 61.3 mL/min (ref >60.00)
Glucose, Bld: 104 mg/dL — ABNORMAL HIGH (ref 70–99)
Potassium: 4.3 mEq/L (ref 3.5–5.1)
Sodium: 140 mEq/L (ref 135–145)
Total Bilirubin: 0.3 mg/dL (ref 0.2–1.2)
Total Protein: 6.5 g/dL (ref 6.0–8.3)

## 2020-06-12 LAB — HEMOGLOBIN A1C: Hgb A1c MFr Bld: 6 % (ref 4.6–6.5)

## 2020-06-12 LAB — TSH: TSH: 2.98 u[IU]/mL (ref 0.35–4.50)

## 2020-06-12 MED ORDER — GABAPENTIN 100 MG PO CAPS: 100.0000 mg | ORAL_CAPSULE | Freq: Three times a day (TID) | ORAL | 3 refills | Status: DC

## 2020-06-12 NOTE — Progress Notes (Signed)
Patient ID: Sarah [name], female    DOB: Apr 17, 1951  Age: 70 y.o. MRN: [id number]  The patient is here for annual preventive examination and management of other chronic and acute problems.  This visit occurred during the SARS-CoV-2 public health emergency.  Safety protocols were in place, including screening questions prior to the visit, additional usage of staff PPE, and extensive cleaning of exam room while observing appropriate contact time as indicated for disinfecting solutions.      The risk factors are reflected in the social history.  The roster of all physicians providing medical care to patient - is listed in the Snapshot section of the chart.  Activities of daily living:  The patient is 100% independent in all ADLs: dressing, toileting, feeding as well as independent mobility  Home safety : The patient has smoke detectors in the home. They wear seatbelts.  There are no firearms at home. There is no violence in the home.   There is no risks for hepatitis, STDs or HIV. There is no   history of blood transfusion. They have no travel history to infectious disease endemic areas of the world.  The patient has seen their dentist in the last six month. They have seen their eye doctor in the last year. They admit to slight hearing difficulty with regard to whispered voices and some television programs.  They have deferred audiologic testing in the last year.  They do not  have excessive sun exposure. Discussed the need for sun protection: hats, long sleeves and use of sunscreen if there is significant sun exposure.   Diet: the importance of a healthy diet is discussed. They do have a healthy diet.  The benefits of regular aerobic exercise were discussed. She walks 4 times per week ,  20 minutes.   Depression screen: there are no signs or vegative symptoms of depression- irritability, change in appetite, anhedonia, sadness/tearfullness.  Cognitive assessment: the patient manages all their  financial and personal affairs and is actively engaged. They could relate day,date,year and events; recalled 2/3 objects at 3 minutes; performed clock-face test normally.  The following portions of the patient's history were reviewed and updated as appropriate: allergies, current medications, past family history, past medical history,  past surgical history, past social history  and problem list.  Visual acuity was not assessed per patient preference since she has regular follow up with her ophthalmologist. Hearing and body mass index were assessed and reviewed.   During the course of the visit the patient was educated and counseled about appropriate screening and preventive services including : fall prevention , diabetes screening, nutrition counseling, colorectal cancer screening, and recommended immunizations.    CC: The primary encounter diagnosis was Encounter for preventive health examination. Diagnoses of Hyperlipidemia, unspecified hyperlipidemia type, Acquired hypothyroidism, Prediabetes, Moderate persistent asthma without complication, Chest tightness, Reactive depression, and Neuropathy were also pertinent to this visit.   ER visit in the summer at [facility] for asthma exacerbation.  Treated with nebs and steroids and sent home.  Has followed up with pulmonology and states that she has been  breathing better since Dr Patsey Berthold switched her to Trelegy .  She is due to have repeat PFTS done this year.    Mammogram normal  Colonoscopy normal in 2016   She has developed Neuropathy involving both feet described  as burning,  Has been noticeably Worse over the last 6 months .  Using a salve which helps temprarily   Asthma/COPD:  She reports improved  symptoms sicne Dr    History Hassan Rowan has a past medical history of Arthritis, Asthma, Depression, Dyspnea, Environmental and seasonal allergies, GERD (gastroesophageal reflux disease), Presence of dental prosthetic device, and Wheezing.   She  has a past surgical history that includes Abdominal hysterectomy (1989); Foot surgery (Left, 1985); Knee surgery (2016); Colonoscopy with propofol (N/A, 09/30/2014); Esophagogastroduodenoscopy (N/A, 09/30/2014); Cataract extraction w/PHACO (Right, 09/13/2016); Cataract extraction w/PHACO (Left, 10/11/2016); and Brow lift (Bilateral, 02/07/2020).   Her family history includes Diabetes in her mother; Heart disease in her mother; Hyperlipidemia in her mother.She reports that she has never smoked. She has never used smokeless tobacco. She reports that she does not drink alcohol and does not use drugs.  Outpatient Medications Prior to Visit  Medication Sig Dispense Refill  . albuterol (PROVENTIL HFA;VENTOLIN HFA) 108 (90 Base) MCG/ACT inhaler Inhale 2 puffs into the lungs every 6 (six) hours as needed for wheezing or shortness of breath. 1 Inhaler 11  . budesonide (PULMICORT) 0.25 MG/2ML nebulizer solution Take 2 mLs (0.25 mg total) by nebulization 2 (two) times daily. 60 mL 12  . Calcium Carb-Cholecalciferol (CALCIUM 600 + D PO) Take 2 tablets by mouth daily.    . citalopram (CELEXA) 20 MG tablet TAKE ONE TABLET BY MOUTH EVERY DAY 30 tablet 4  . famotidine (PEPCID) 20 MG tablet TAKE 1 TABLET BY MOUTH DAILY. BEFORE DINNER (Patient taking differently: Take 20 mg by mouth every evening.) 30 tablet 11  . fexofenadine (ALLEGRA) 180 MG tablet Take 180 mg by mouth daily.    . Fluticasone-Umeclidin-Vilant (TRELEGY ELLIPTA) 100-62.5-25 MCG/INH AEPB Inhale into the lungs.    Marland Kitchen ibuprofen (ADVIL,MOTRIN) 200 MG tablet Take 600 mg by mouth daily as needed for moderate pain.    Marland Kitchen ipratropium-albuterol (DUONEB) 0.5-2.5 (3) MG/3ML SOLN Take 3 mLs by nebulization every 6 (six) hours as needed. 360 mL 2  . levothyroxine (SYNTHROID) 50 MCG tablet TAKE 1 TABLET EVERY DAY ON EMPTY STOMACHWITH A GLASS OF WATER AT LEAST 30-60 MINBEFORE BREAKFAST 90 tablet 1  . Omega-3 Fatty Acids (FISH OIL) 1200 MG CAPS Take 1,200 mg by mouth daily.     . pantoprazole (PROTONIX) 40 MG tablet TAKE ONE TABLET BY MOUTH EVERY MORNING 30 MIN BEFORE BREAKFAST 30 tablet 11  . traMADol (ULTRAM) 50 MG tablet Take 1 every 4-6 hours as needed for pain not controlled by Tylenol 6 tablet 0  . erythromycin ophthalmic ointment Apply to sutures 4 times a day for 10-12 days.  Discontinue if allergy develops and call our office (Patient not taking: Reported on 06/12/2020) 3.5 g 2   No facility-administered medications prior to visit.    Review of Systems   Patient denies headache, fevers, malaise, unintentional weight loss, skin rash, eye pain, sinus congestion and sinus pain, sore throat, dysphagia,  hemoptysis , cough, dyspnea, wheezing, chest pain, palpitations, orthopnea, edema, abdominal pain, nausea, melena, diarrhea, constipation, flank pain, dysuria, hematuria, urinary  Frequency, nocturia, numbness, tingling, seizures,  Focal weakness, Loss of consciousness,  Tremor, insomnia, depression, anxiety, and suicidal ideation.      Objective:  BP 128/76 (BP Location: Left Arm, Patient Position: Sitting, Cuff Size: Normal)   Pulse 75   Temp 99.3 F (37.4 C) (Oral)   Resp 16   Ht 5\' 3"  (1.6 m)   Wt 177 lb 12.8 oz (80.6 kg)   SpO2 97%   BMI 31.50 kg/m   Physical Exam  General appearance: alert, cooperative and appears stated age Head: Normocephalic, without obvious abnormality, atraumatic  Eyes: conjunctivae/corneas clear. PERRL, EOM's intact. Fundi benign. Ears: normal TM's and external ear canals both ears Nose: Nares normal. Septum midline. Mucosa normal. No drainage or sinus tenderness. Throat: lips, mucosa, and tongue normal; teeth and gums normal Neck: no adenopathy, no carotid bruit, no JVD, supple, symmetrical, trachea midline and thyroid not enlarged, symmetric, no tenderness/mass/nodules Lungs: clear to auscultation bilaterally Breasts: normal appearance, no masses or tenderness Heart: regular rate and rhythm, S1, S2 normal, no murmur,  click, rub or gallop Abdomen: soft, non-tender; bowel sounds normal; no masses,  no organomegaly Extremities: extremities normal, atraumatic, no cyanosis or edema Pulses: 2+ and symmetric Skin: Skin color, texture, turgor normal. No rashes or lesions Neurologic: Alert and oriented X 3, normal strength and tone. Normal symmetric reflexes. Normal coordination and gait.     Assessment & Plan:   Problem List Items Addressed This Visit      Unprioritized   Asthma    Symptoms are better controlled on Trelegy,  Prescribed by Dr Patsey Berthold.       Relevant Medications   Fluticasone-Umeclidin-Vilant (TRELEGY ELLIPTA) 100-62.5-25 MCG/INH AEPB   Other Relevant Orders   CBC with Differential/Platelet (Completed)   AMB Referral to [facility]   Chest tightness    She underwent noninvasive testing in 2020,  myoview was non diagnostic of ischemia       Depression    Triggered by complicated grief after the passing of her mother several years ago.  Her symptoms are well managed with citalopram. She is not interested in stopping medication      Encounter for preventive health examination - Primary    age appropriate education and counseling updated, referrals for preventative services and immunizations addressed, dietary and smoking counseling addressed, most recent labs reviewed.  I have personally reviewed and have noted:  1) the patient's medical and social history 2) The pt's use of alcohol, tobacco, and illicit drugs 3) The patient's current medications and supplements 4) Functional ability including ADL's, fall risk, home safety risk, hearing and visual impairment 5) Diet and physical activities 6) Evidence for depression or mood disorder 7) The patient's height, weight, and BMI have been recorded in the chart  I have made referrals, and provided counseling and education based on review of the above      Hyperlipidemia    She did not tolerate trial of red yeast rice.   10 yr risk of CAD is 9% using the [facility]  Lab Results  Component Value Date   CHOL 221 (H) 06/12/2020   HDL 65.50 06/12/2020   LDLCALC 143 (H) 06/12/2020   TRIG 63.0 06/12/2020   CHOLHDL 3 06/12/2020           Relevant Orders   Lipid panel (Completed)   Hypothyroid    Managed with 50 mcg levothyroxine.  No changes today   Lab Results  Component Value Date   TSH 2.98 06/12/2020         Relevant Orders   TSH (Completed)   Neuropathy    Etiology unclear. Thyroid and a1c have been done but needs B12 level given chronic PPI use/   No results found for: VITAMINB12       Relevant Orders   B12 and Folate Panel   Prediabetes    Her A1c and fasting glucose remain in the prediabetic range  Lab Results  Component Value Date   HGBA1C 6.0 06/12/2020         Relevant Orders   Hemoglobin A1c (  Completed)   Comprehensive metabolic panel (Completed)      I have discontinued Hassan Rowan C. Bray's erythromycin. I am also having her start on gabapentin. Additionally, I am having her maintain her Fish Oil, ibuprofen, albuterol, fexofenadine, Calcium Carb-Cholecalciferol (CALCIUM 600 + D PO), famotidine, ipratropium-albuterol, budesonide, pantoprazole, levothyroxine, traMADol, citalopram, and Trelegy Ellipta.  Meds ordered this encounter  Medications  . gabapentin (NEURONTIN) 100 MG capsule    Sig: Take 1 capsule (100 mg total) by mouth 3 (three) times daily.    Dispense:  90 capsule    Refill:  3    Medications Discontinued During This Encounter  Medication Reason  . erythromycin ophthalmic ointment     Follow-up: Return in about 6 months (around 12/10/2020).   Crecencio Mc, MD

## 2020-06-12 NOTE — Chronic Care Management (AMB) (Signed)
  Chronic Care Management   Note  06/12/2020 Name: Sarah [name] MRN: [id number] DOB: 11-May-1950  Sarah [name] is a 71 y.o. year old female who is a primary care patient of Derrel Nip, Aris Everts, MD. I reached out to Sarah [name] by phone today in response to a referral sent by Sarah [name]'s PCP, Crecencio Mc, MD     Sarah [name] was given information about Chronic Care Management services today including:  1. CCM service includes personalized support from designated clinical staff supervised by her physician, including individualized plan of care and coordination with other care providers 2. 24/7 contact phone numbers for assistance for urgent and routine care needs. 3. Service will only be billed when office clinical staff spend 20 minutes or more in a month to coordinate care. 4. Only one practitioner may furnish and bill the service in a calendar month. 5. The patient may stop CCM services at any time (effective at the end of the month) by phone call to the office staff. 6. The patient will be responsible for cost sharing (co-pay) of up to 20% of the service fee (after annual deductible is met).  Patient agreed to services and verbal consent obtained.   Follow up plan: Telephone appointment with care management team member scheduled for:07/01/2020  SIGNATURE

## 2020-06-12 NOTE — Patient Instructions (Addendum)
I want you to start walking daily so you can lose weight  Low glycemic index diet recommended as you have been "prediabetic" in the past    Your feet burning is a sign of neuropathy.  This is a pain due to irritated nerves  TRY THE GABAPENTIN ONCE DAILY IN THE EVENING.  YOU CAN INCREASE THE DOSE GRADUALLY USING THE EXTRA TABLETS   TELL YOU SISTER/FRIEND TO CONTACT HER CANCER DOCTOR IF SHE NEEDS TREATMENT OR XRAYS FOR HER PAIN.  I CANNOT ORDER OR TREAT HER BECAUSE SHE IS NOT MY PATIENT  I HAVE MADE A REFERRAL TO CHRONIC CARE MANAGEMENT . THEY WILL CALL YOU   Health Maintenance After Age 73 After age 45, you are at a higher risk for certain long-term diseases and infections as well as injuries from falls. Falls are a major cause of broken bones and head injuries in people who are older than age 62. Getting regular preventive care can help to keep you healthy and well. Preventive care includes getting regular testing and making lifestyle changes as recommended by your health care provider. Talk with your health care provider about:  Which screenings and tests you should have. A screening is a test that checks for a disease when you have no symptoms.  A diet and exercise plan that is right for you. What should I know about screenings and tests to prevent falls? Screening and testing are the best ways to find a health problem early. Early diagnosis and treatment give you the best chance of managing medical conditions that are common after age 76. Certain conditions and lifestyle choices may make you more likely to have a fall. Your health care provider may recommend:  Regular vision checks. Poor vision and conditions such as cataracts can make you more likely to have a fall. If you wear glasses, make sure to get your prescription updated if your vision changes.  Medicine review. Work with your health care provider to regularly review all of the medicines you are taking, including over-the-counter  medicines. Ask your health care provider about any side effects that may make you more likely to have a fall. Tell your health care provider if any medicines that you take make you feel dizzy or sleepy.  Osteoporosis screening. Osteoporosis is a condition that causes the bones to get weaker. This can make the bones weak and cause them to break more easily.  Blood pressure screening. Blood pressure changes and medicines to control blood pressure can make you feel dizzy.  Strength and balance checks. Your health care provider may recommend certain tests to check your strength and balance while standing, walking, or changing positions.  Foot health exam. Foot pain and numbness, as well as not wearing proper footwear, can make you more likely to have a fall.  Depression screening. You may be more likely to have a fall if you have a fear of falling, feel emotionally low, or feel unable to do activities that you used to do.  Alcohol use screening. Using too much alcohol can affect your balance and may make you more likely to have a fall. What actions can I take to lower my risk of falls? General instructions  Talk with your health care provider about your risks for falling. Tell your health care provider if: ? You fall. Be sure to tell your health care provider about all falls, even ones that seem minor. ? You feel dizzy, sleepy, or off-balance.  Take over-the-counter and prescription medicines only  as told by your health care provider. These include any supplements.  Eat a healthy diet and maintain a healthy weight. A healthy diet includes low-fat dairy products, low-fat (lean) meats, and fiber from whole grains, beans, and lots of fruits and vegetables. Home safety  Remove any tripping hazards, such as rugs, cords, and clutter.  Install safety equipment such as grab bars in bathrooms and safety rails on stairs.  Keep rooms and walkways well-lit. Activity  Follow a regular exercise program  to stay fit. This will help you maintain your balance. Ask your health care provider what types of exercise are appropriate for you.  If you need a cane or walker, use it as recommended by your health care provider.  Wear supportive shoes that have nonskid soles.   Lifestyle  Do not drink alcohol if your health care provider tells you not to drink.  If you drink alcohol, limit how much you have: ? 0-1 drink a day for women. ? 0-2 drinks a day for men.  Be aware of how much alcohol is in your drink. In the [place] one drink equals one typical bottle of beer (12 oz), one-half glass of wine (5 oz), or one shot of hard liquor (1 oz).  Do not use any products that contain nicotine or tobacco, such as cigarettes and e-cigarettes. If you need help quitting, ask your health care provider. Summary  Having a healthy lifestyle and getting preventive care can help to protect your health and wellness after age 27.  Screening and testing are the best way to find a health problem early and help you avoid having a fall. Early diagnosis and treatment give you the best chance for managing medical conditions that are more common for people who are older than age 37.  Falls are a major cause of broken bones and head injuries in people who are older than age 81. Take precautions to prevent a fall at home.  Work with your health care provider to learn what changes you can make to improve your health and wellness and to prevent falls. This information is not intended to replace advice given to you by your health care provider. Make sure you discuss any questions you have with your health care provider. Document Revised: 08/02/2018 Document Reviewed: 02/22/2017 Elsevier Patient Education  2021 [employer].

## 2020-06-14 DIAGNOSIS — G629 Polyneuropathy, unspecified: Secondary | ICD-10-CM | POA: Insufficient documentation

## 2020-06-14 NOTE — Assessment & Plan Note (Addendum)
She did not tolerate trial of red yeast rice.  10 yr risk of CAD is 9% using the [facility]  Lab Results  Component Value Date   CHOL 221 (H) 06/12/2020   HDL 65.50 06/12/2020   LDLCALC 143 (H) 06/12/2020   TRIG 63.0 06/12/2020   CHOLHDL 3 06/12/2020

## 2020-06-14 NOTE — Assessment & Plan Note (Signed)
Managed with 50 mcg levothyroxine.  No changes today   Lab Results  Component Value Date   TSH 2.98 06/12/2020

## 2020-06-14 NOTE — Assessment & Plan Note (Signed)
She underwent noninvasive testing in 2020,  myoview was non diagnostic of ischemia

## 2020-06-14 NOTE — Assessment & Plan Note (Addendum)
Triggered by complicated grief after the passing of her mother several years ago.  Her symptoms are well managed with citalopram. She is not interested in stopping medication

## 2020-06-14 NOTE — Assessment & Plan Note (Signed)

## 2020-06-14 NOTE — Assessment & Plan Note (Signed)
Etiology unclear. Thyroid and a1c have been done but needs B12 level given chronic PPI use/   No results found for: VITAMINB12

## 2020-06-14 NOTE — Assessment & Plan Note (Signed)
Her A1c and fasting glucose remain in the prediabetic range  Lab Results  Component Value Date   HGBA1C 6.0 06/12/2020

## 2020-06-14 NOTE — Assessment & Plan Note (Signed)
Symptoms are better controlled on Trelegy,  Prescribed by Dr Patsey Berthold.

## 2020-06-16 ENCOUNTER — Other Ambulatory Visit: Payer: Self-pay

## 2020-06-16 ENCOUNTER — Telehealth: Payer: Self-pay | Admitting: Pulmonary Disease

## 2020-06-16 DIAGNOSIS — R5383 Other fatigue: Secondary | ICD-10-CM

## 2020-06-16 NOTE — Telephone Encounter (Signed)
I have spoke with Sarah [name] and her Covid Test has been scheduled on 07/13/20 at [facility] between 8-1:00pm drive thru.  PFT has been scheduled 07/14/20 @ 1:00pm [facility] arrive at 12:45pm

## 2020-06-19 ENCOUNTER — Other Ambulatory Visit (INDEPENDENT_AMBULATORY_CARE_PROVIDER_SITE_OTHER): Payer: Medicare Other

## 2020-06-19 ENCOUNTER — Other Ambulatory Visit: Payer: Self-pay

## 2020-06-19 ENCOUNTER — Telehealth: Payer: Self-pay | Admitting: *Deleted

## 2020-06-19 DIAGNOSIS — E538 Deficiency of other specified B group vitamins: Secondary | ICD-10-CM

## 2020-06-19 DIAGNOSIS — R5383 Other fatigue: Secondary | ICD-10-CM | POA: Diagnosis not present

## 2020-06-19 LAB — B12 AND FOLATE PANEL
Folate: 11 ng/mL (ref >5.9)
Vitamin B-12: 177 pg/mL — ABNORMAL LOW (ref 211–911)

## 2020-06-19 NOTE — Chronic Care Management (AMB) (Signed)
  Care Management   Note  06/19/2020 Name: Sarah [name] MRN: [id number] DOB: 1950/05/19  Sarah [name] is a 70 y.o. year old female who is a primary care patient of Derrel Nip, Aris Everts, MD and is actively engaged with the care management team. I reached out to [name] by phone today to assist with re-scheduling an initial visit with the Pharmacist  Follow up plan: Unsuccessful telephone outreach attempt made. A HIPAA compliant phone message was left for the patient providing contact information and requesting a return call.  The care management team will reach out to the patient again over the next 4 days.  If patient returns call to provider office, please advise to call [facility] Lysle Morales at [facility]

## 2020-06-19 NOTE — Chronic Care Management (AMB) (Signed)
  Care Management   Note  06/19/2020 Name: Sarah [name] MRN: [id number] DOB: 12-19-1950  Sarah [name] is a 70 y.o. year old female who is a primary care patient of Derrel Nip, Aris Everts, MD and is actively engaged with the care management team. I reached out to [name] by phone today to assist with re-scheduling a follow up visit with the Pharmacist  Follow up plan: Telephone appointment with care management team member scheduled for:07/03/2020  [place]

## 2020-06-21 NOTE — Addendum Note (Signed)
Addended by: Crecencio Mc on: 06/21/2020 08:56 PM   Modules accepted: Orders

## 2020-06-21 NOTE — Progress Notes (Signed)
Her B12 is low and her folate level is normal .  Please ask her to come in for b12 injections weely for 3 weeks,  And she can learn how to do them herself, .  Untreated B12 deficiency can lead to irreversible dementia and neuropathy so it is very important to treat .   She will need an IF antibody test at one of her visits. .  (labs ordered)

## 2020-06-24 ENCOUNTER — Other Ambulatory Visit: Payer: Self-pay

## 2020-06-24 ENCOUNTER — Ambulatory Visit (INDEPENDENT_AMBULATORY_CARE_PROVIDER_SITE_OTHER): Payer: Medicare Other | Admitting: *Deleted

## 2020-06-24 ENCOUNTER — Other Ambulatory Visit (INDEPENDENT_AMBULATORY_CARE_PROVIDER_SITE_OTHER): Payer: Medicare Other

## 2020-06-24 DIAGNOSIS — E538 Deficiency of other specified B group vitamins: Secondary | ICD-10-CM | POA: Diagnosis not present

## 2020-06-24 DIAGNOSIS — G629 Polyneuropathy, unspecified: Secondary | ICD-10-CM | POA: Diagnosis not present

## 2020-06-24 MED ORDER — CYANOCOBALAMIN 1000 MCG/ML IJ SOLN
1000.0000 ug | Freq: Once | INTRAMUSCULAR | Status: AC
  Administered 2020-06-24: 1000 ug via INTRAMUSCULAR

## 2020-06-24 NOTE — Progress Notes (Signed)
Pt received first or 3 weekly b12 injections in left arm. Tolerated it well. Pt also had B12 labs done prior to injection.

## 2020-06-25 LAB — B12 AND FOLATE PANEL
Folate: 12.1 ng/mL (ref >5.9)
Vitamin B-12: 167 pg/mL — ABNORMAL LOW (ref 211–911)

## 2020-06-26 LAB — INTRINSIC FACTOR ANTIBODIES: Intrinsic Factor: NEGATIVE

## 2020-07-01 ENCOUNTER — Ambulatory Visit: Payer: Medicare Other

## 2020-07-01 ENCOUNTER — Telehealth: Payer: Medicare Other

## 2020-07-02 ENCOUNTER — Other Ambulatory Visit: Payer: Self-pay

## 2020-07-02 ENCOUNTER — Ambulatory Visit (INDEPENDENT_AMBULATORY_CARE_PROVIDER_SITE_OTHER): Payer: Medicare Other

## 2020-07-02 DIAGNOSIS — H57813 Brow ptosis, bilateral: Secondary | ICD-10-CM | POA: Diagnosis not present

## 2020-07-02 DIAGNOSIS — E538 Deficiency of other specified B group vitamins: Secondary | ICD-10-CM

## 2020-07-02 DIAGNOSIS — H524 Presbyopia: Secondary | ICD-10-CM | POA: Diagnosis not present

## 2020-07-02 MED ORDER — CYANOCOBALAMIN 1000 MCG/ML IJ SOLN
1000.0000 ug | Freq: Once | INTRAMUSCULAR | Status: AC
  Administered 2020-07-02: 1000 ug via INTRAMUSCULAR

## 2020-07-02 NOTE — Progress Notes (Signed)
Sarah [name] presents today for injection per MD orders. B12 injection  administered IM in left Upper Arm. Administration without incident. Patient tolerated well. ,cma

## 2020-07-03 ENCOUNTER — Telehealth: Payer: Self-pay | Admitting: Pharmacist

## 2020-07-03 ENCOUNTER — Telehealth: Payer: Medicare Other

## 2020-07-03 NOTE — Telephone Encounter (Signed)
  Chronic Care Management   Note  07/03/2020 Name: Sarah [name] MRN: [id number] DOB: 06-20-1950   Attempted to contact patient for scheduled appointment for medication management support. Patient had forgotten about my appointment and was on the way to a hair appointment. Requested to reschedule for next available.   [name], PharmD, [place], [place] Clinical Pharmacist [employer] at [place]

## 2020-07-09 ENCOUNTER — Other Ambulatory Visit: Payer: Self-pay | Admitting: Internal Medicine

## 2020-07-09 ENCOUNTER — Other Ambulatory Visit: Payer: Self-pay

## 2020-07-09 ENCOUNTER — Ambulatory Visit (INDEPENDENT_AMBULATORY_CARE_PROVIDER_SITE_OTHER): Payer: Medicare Other

## 2020-07-09 DIAGNOSIS — E538 Deficiency of other specified B group vitamins: Secondary | ICD-10-CM | POA: Diagnosis not present

## 2020-07-09 MED ORDER — CYANOCOBALAMIN 1000 MCG/ML IJ SOLN
1000.0000 ug | Freq: Once | INTRAMUSCULAR | Status: AC
  Administered 2020-07-09: 1000 ug via INTRAMUSCULAR

## 2020-07-09 NOTE — Progress Notes (Signed)
Patient presented for B 12 injection to left deltoid, patient voiced no concerns nor showed any signs of distress during injection. 

## 2020-07-10 ENCOUNTER — Telehealth: Payer: Self-pay

## 2020-07-10 NOTE — Telephone Encounter (Signed)
Patient is aware of date/time of covid test prior to PFT.  

## 2020-07-13 ENCOUNTER — Other Ambulatory Visit
Admission: RE | Admit: 2020-07-13 | Discharge: 2020-07-13 | Disposition: A | Discharge status: Home / Self Care | Payer: Medicare Other | Source: Ambulatory Visit | Attending: Pulmonary Disease | Admitting: Pulmonary Disease

## 2020-07-13 ENCOUNTER — Other Ambulatory Visit: Payer: Self-pay

## 2020-07-13 DIAGNOSIS — Z01812 Encounter for preprocedural laboratory examination: Secondary | ICD-10-CM | POA: Diagnosis not present

## 2020-07-13 DIAGNOSIS — Z20822 Contact with and (suspected) exposure to covid-19: Secondary | ICD-10-CM | POA: Diagnosis not present

## 2020-07-13 LAB — SARS CORONAVIRUS 2 (TAT 6-24 HRS): SARS Coronavirus 2: NEGATIVE

## 2020-07-14 ENCOUNTER — Ambulatory Visit: Payer: Medicare Other | Attending: Pulmonary Disease

## 2020-07-14 DIAGNOSIS — R059 Cough, unspecified: Secondary | ICD-10-CM | POA: Insufficient documentation

## 2020-07-14 DIAGNOSIS — J4540 Moderate persistent asthma, uncomplicated: Secondary | ICD-10-CM

## 2020-07-14 MED ORDER — ALBUTEROL SULFATE (2.5 MG/3ML) 0.083% IN NEBU
2.5000 mg | INHALATION_SOLUTION | Freq: Once | RESPIRATORY_TRACT | Status: AC
  Administered 2020-07-14: 2.5 mg via RESPIRATORY_TRACT
  Filled 2020-07-14: qty 3

## 2020-07-15 ENCOUNTER — Ambulatory Visit (INDEPENDENT_AMBULATORY_CARE_PROVIDER_SITE_OTHER): Payer: Medicare Other | Admitting: Pharmacist

## 2020-07-15 DIAGNOSIS — R7303 Prediabetes: Secondary | ICD-10-CM

## 2020-07-15 DIAGNOSIS — J4540 Moderate persistent asthma, uncomplicated: Secondary | ICD-10-CM

## 2020-07-15 DIAGNOSIS — E538 Deficiency of other specified B group vitamins: Secondary | ICD-10-CM

## 2020-07-15 DIAGNOSIS — E785 Hyperlipidemia, unspecified: Secondary | ICD-10-CM | POA: Diagnosis not present

## 2020-07-15 DIAGNOSIS — E039 Hypothyroidism, unspecified: Secondary | ICD-10-CM | POA: Diagnosis not present

## 2020-07-15 NOTE — Chronic Care Management (AMB) (Signed)
Chronic Care Management Pharmacy Note  07/15/2020 Name:  Sarah [name] MRN:  [id number] DOB:  Aug 21, 1950  Subjective: Sarah [name] is an 70 y.o. year old female who is a primary patient of Derrel Nip, Aris Everts, MD.  The CCM team was consulted for assistance with disease management and care coordination needs.    Engaged with patient by telephone for initial visit in response to provider referral for pharmacy case management and/or care coordination services.   Consent to Services:  The patient was given the following information about Chronic Care Management services today, agreed to services, and gave verbal consent: 1. CCM service includes personalized support from designated clinical staff supervised by the primary care provider, including individualized plan of care and coordination with other care providers 2. 24/7 contact phone numbers for assistance for urgent and routine care needs. 3. Service will only be billed when office clinical staff spend 20 minutes or more in a month to coordinate care. 4. Only one practitioner may furnish and bill the service in a calendar month. 5.The patient may stop CCM services at any time (effective at the end of the month) by phone call to the office staff. 6. The patient will be responsible for cost sharing (co-pay) of up to 20% of the service fee (after annual deductible is met). Patient agreed to services and consent obtained.  Patient Care Team: Crecencio Mc, MD as PCP - General (Internal Medicine) Crecencio Mc, MD (Internal Medicine) Bary Castilla Forest Gleason, MD (General Surgery) De Hollingshead, RPH-CPP (Pharmacist)  Recent office visits:  2/18 PCP - neuropathy, added gabapentin  Recent consult visits:  3/22- PFT  1/25 pulm Patsey Berthold - moderate persistent asthma, trial of Trelegy w/ relief, GERD controlled w/ PPI  Hospital visits: None in previous 6 months  Objective:  Lab Results  Component Value Date   CREATININE 0.94  06/12/2020   CREATININE 0.88 04/08/2019   CREATININE 0.80 06/29/2018    Lab Results  Component Value Date   HGBA1C 6.0 06/12/2020   Last diabetic Eye exam: No results found for: HMDIABEYEEXA  Last diabetic Foot exam: No results found for: HMDIABFOOTEX      Component Value Date/Time   CHOL 221 (H) 06/12/2020 0836   TRIG 63.0 06/12/2020 0836   HDL 65.50 06/12/2020 0836   CHOLHDL 3 06/12/2020 0836   VLDL 12.6 06/12/2020 0836   LDLCALC 143 (H) 06/12/2020 0836   LDLCALC 142 (H) 04/06/2018 1520    Hepatic Function Latest Ref Rng & Units 06/12/2020 04/08/2019 04/06/2018  Total Protein 6.0 - 8.3 g/dL 6.5 6.9 6.9  Albumin 3.5 - 5.2 g/dL 4.1 4.2 -  AST 0 - 37 U/L _0 ALT 0 - 35 U/L _1 Alk Phosphatase 39 - 117 U/L 66 69 -  Total Bilirubin 0.2 - 1.2 mg/dL 0.3 0.5 0.5    Lab Results  Component Value Date/Time   TSH 2.98 06/12/2020 08:36 AM   TSH 2.91 04/08/2019 08:02 AM    CBC Latest Ref Rng & Units 06/12/2020 04/08/2019 06/29/2018  WBC 4.0 - 10.5 K/uL 5.8 6.3 4.3  Hemoglobin 12.0 - 15.0 g/dL 11.9(L) 12.2 11.6(L)  Hematocrit 36.0 - 46.0 % 35.1(L) 36.6 35.8(L)  Platelets 150.0 - 400.0 K/uL 145.0(L) 174.0 167    Lab Results  Component Value Date/Time   VD25OH 32.17 07/30/2015 01:27 PM    Clinical ASCVD: No  The 10-year ASCVD risk score Mikey Bussing DC Jr., et al., 2013) is: 10.5%  Values used to calculate the score:     Age: 68 years     Sex: Female     Is Non-Hispanic African American: No     Diabetic: No     Tobacco smoker: No     Systolic Blood Pressure: 798 mmHg     Is BP treated: No     HDL Cholesterol: 65.5 mg/dL     Total Cholesterol: 221 mg/dL     Social History   Tobacco Use  Smoking Status Never Smoker  Smokeless Tobacco Never Used   BP Readings from Last 3 Encounters:  06/12/20 128/76  05/19/20 120/78  04/07/20 126/72   Pulse Readings from Last 3 Encounters:  06/12/20 75  05/19/20 81  04/07/20 68   Wt Readings from Last 3 Encounters:   06/12/20 177 lb 12.8 oz (80.6 kg)  05/19/20 178 lb 11.2 oz (81.1 kg)  04/13/20 180 lb (81.6 kg)    Assessment: Review of patient past medical history, allergies, medications, health status, including review of consultants reports, laboratory and other test data, was performed as part of comprehensive evaluation and provision of chronic care management services.   SDOH:  (Social Determinants of Health) assessments and interventions performed:    CCM Care Plan  No Known Allergies  Medications Reviewed Today    Reviewed by De Hollingshead, RPH-CPP (Pharmacist) on 07/15/20 at 1149  Med List Status: <None>  Medication Order Taking? Sig Documenting Provider Last Dose Status Informant  acetaminophen (TYLENOL) 500 MG tablet [id number] Yes Take 500 mg by mouth every 6 (six) hours as needed. [provider] Taking Active   albuterol (PROVENTIL HFA;VENTOLIN HFA) 108 (90 Base) MCG/ACT inhaler [id number] No Inhale 2 puffs into the lungs every 6 (six) hours as needed for wheezing or shortness of breath.  Patient not taking: Reported on 07/15/2020   Crecencio Mc, MD Not Taking Active Self  budesonide (PULMICORT) 0.25 MG/2ML nebulizer solution [id number] No Take 2 mLs (0.25 mg total) by nebulization 2 (two) times daily.  Patient not taking: Reported on 07/15/2020   Crecencio Mc, MD Not Taking Active   Calcium Carb-Cholecalciferol (CALCIUM 600 + D PO) [id number] Yes Take 2 tablets by mouth daily. [provider] Taking Active Self  citalopram (CELEXA) 20 MG tablet [id number] Yes TAKE ONE TABLET BY MOUTH EVERY DAY Crecencio Mc, MD Taking Active   famotidine (PEPCID) 20 MG tablet [id number] Yes TAKE 1 TABLET BY MOUTH DAILY. BEFORE DINNER  Patient taking differently: Take 20 mg by mouth daily.   Crecencio Mc, MD Taking Active   fexofenadine [facility]) 180 MG tablet [id number] Yes Take 180 mg by mouth daily. [provider] Taking Active Self  Fluticasone-Umeclidin-Vilant  (TRELEGY ELLIPTA) 100-62.5-25 MCG/INH AEPB [id number] Yes Inhale 1 puff into the lungs daily. [provider] Taking Active   gabapentin (NEURONTIN) 100 MG capsule [id number] Yes Take 1 capsule (100 mg total) by mouth 3 (three) times daily. Crecencio Mc, MD Taking Active   ipratropium-albuterol (DUONEB) 0.5-2.5 (3) MG/3ML SOLN [id number] No Take 3 mLs by nebulization every 6 (six) hours as needed.  Patient not taking: Reported on 07/15/2020   Dustin Flock, MD Not Taking Active   levothyroxine (SYNTHROID) 50 MCG tablet [id number] Yes TAKE 1 TABLET EVERY DAY ON EMPTY STOMACHWITH A GLASS OF WATER AT LEAST 30-60 MINBEFORE BREAKFAST Crecencio Mc, MD Taking Active   pantoprazole (PROTONIX) 40 MG tablet [id number] Yes TAKE ONE TABLET BY MOUTH EVERY MORNING 84 MIN BEFORE BREAKFAST [place],  Aris Everts, MD Taking Active           Patient Active Problem List   Diagnosis Date Noted  . Neuropathy 06/14/2020  . Educated about COVID-19 virus infection 04/10/2019  . Prediabetes 10/13/2018  . LVH (left ventricular hypertrophy) due to hypertensive disease, without heart failure 10/13/2018  . Chest tightness 06/26/2018  . Asthma 06/04/2018  . Hypothyroid 06/15/2017  . Post herpetic neuralgia 06/06/2017  . Encounter for preventive health examination 09/09/2014  . Esophageal reflux 09/09/2014  . Hyperlipidemia 08/17/2014  . S/P hysterectomy 04/28/2013  . Initial Medicare annual wellness visit 04/28/2013  . Depression     Immunization History  Administered Date(s) Administered  . Fluad Quad(high Dose 65+) 12/06/2018  . Influenza, High Dose Seasonal PF 04/22/2016, 06/05/2017  . Influenza,inj,Quad PF,6+ Mos 04/26/2013, 01/30/2015  . Influenza-Unspecified 01/24/2018, 12/06/2018, 01/02/2020  . Moderna Sars-Covid-2 Vaccination 06/20/2019, 07/22/2019, 02/14/2020  . Pneumococcal Conjugate-13 09/01/2014, 01/24/2018  . Pneumococcal Polysaccharide-23 04/26/2013, 02/28/2019  . Tdap 06/18/2014  .  Zoster Recombinat (Shingrix) 07/08/2017, 09/22/2017    Conditions to be addressed/monitored: HLD, Depression and Pulmonary Disease  Care Plan : Medication Management  Updates made by De Hollingshead, RPH-CPP since 07/15/2020 12:00 AM    Problem: Asthma, Neuropathy     Long-Range Goal: Disease Progression Prevention   Start Date: 07/15/2020  This Visit's Progress: On track  Priority: High  Note:   Current Barriers:  . Unable to independently afford treatment regimen  Pharmacist Clinical Goal(s):  Marland Kitchen Over the next 90 days, patient will verbalize ability to afford treatment regimen . Over the next 90 days, patient will achieve adherence to monitoring guidelines and medication adherence to achieve therapeutic efficacy through collaboration with PharmD and provider.   Interventions: . 1:1 collaboration with Crecencio Mc, MD regarding development and update of comprehensive plan of care as evidenced by provider attestation and co-signature . Inter-disciplinary care team collaboration (see longitudinal plan of care) . Comprehensive medication review performed; medication list updated in electronic medical record  Hyperlipidemia: . Uncontrolled; current treatment: none  . Medications previously tried: none . 10 year ASCVD risk score 10% . Uncertain benefit vs risk of statin therapy given age. Consider cardiac CT scoring to evaluate CV risk.   Asthma: . Controlled; current treatment: Trelegy 100/25/62.5 mcg 1 puff daily; has albuterol HFA and albuterol/ipratropium nebulizer, budesonide nebulizer, but has not required any rescue therapy since starting on Trelegy. Notes that "the person that did my PFT yesterday" recommended she complete a breathing treatment before she goes out in the yard. She was unsure which nebulized medication would be preferable . PFT yesterday. Official interpretation pending . Denies cost concerns. Notes that copay is ~ $37. Patient meets income requirements  for Medicare Extra Help, but has an IRA that would put her over the asset limit. She would qualify for [place] patient assistance for Trelegy based on income, but must spend at least $600 on copays in the calendar year. Unsure that she will meet this given the fact that all other medications, besides gabapentin, are $0.  . Continue current regimen at this time. Continue collaboration with pulmonary. Will message Dr. Patsey Berthold to see if she has a recommendation for use of budesonide nebulized vs albuterol/ipratropium nebulized prior to outdoor activity as recommended by RT yesterday.   Depression . Controlled per patient report; current treatment: citalopram 20 mg daily  . Recommend to continue current regimen at this time  GERD: . Controlled per patient report; pantoprazole 40 mg QAM, famotidine 20  mg QPM . Continue current regimen at this time, avoid trigger foods.   Osteopenia: . Current treatment: calcium carbonate 600 mg + vitamin D (dose unknown), 2 tablets daily. No regular dairy sources . Discussed max absorption of calcium of ~ 500 mg at once. Would take 1 tab BID rather than 2 tabs at the same time . Discussed preference for calcium citrate (Citrical) due to concurrent PPI therapy. Patient will pick up a calcium citrate formulation  Neuropathy/Osteoarthritis Pain: . Improved per patient report; current regimen: gabapentin 100 mg QPM, taking acetaminophen 500 mg PRN (has switched away from ibuprofen) . Notes improvement in neuropathy, no issues with sedation since starting gabapentin. Notes that acetaminophen seems to provide less benefit on knee/shoulder pain than ibuprofen did . Discussed Voltaren OTC cream PRN up to QID. Patient notes she will consider trying this.   Hypothyroidism: . Controlled, current regimen: levothyroxine 50 mcg daily . Confirms appropriate administration . Continue current regimen at this time  Supplements: Marland Kitchen Vitamin B12 1000 mcg daily, reports that she also  picked up a B complex and has been taking both. Completing injectable B12 tomorrow. Endorses improvement in neuropathy . Discussed that she only needs B12 or B complex, as B complex contains B12. She verbalized understanding. Educated on relationship between low vitamin B12 levels and neuropathy.   Health Maintenance . Up to date on screenings and vaccinations . Notes today that she would like to schedule an appointment with Dr. Derrel Nip to discuss memory concerns. Will collaborate with office staff to outreach patient to schedule    Patient Goals/Self-Care Activities . Over the next 90 days, patient will:  - take medications as prescribed collaborate with provider on medication access solutions  Follow Up Plan: Telephone follow up appointment with care management team member scheduled for: ~ 12 weeks      Medication Assistance: None required.  Patient affirms current coverage meets needs.  Patient's preferred pharmacy is:  [facility] - [place], [place] - [address] [place] [address] Phone: [phone] Fax: [phone]   Follow Up:  Patient agrees to Care Plan and Follow-up.  Plan: Telephone follow up appointment with care management team member scheduled for:  ~ 12 weeks  [name], PharmD, [place], [place] Clinical Pharmacist [employer] at [employer] [phone]

## 2020-07-15 NOTE — Patient Instructions (Addendum)
[name],   It was great talking with you today!  1) Pick up calcium citrate (Citrical). Look for ~400-500 mg of calcium per tablet and at least 400 units of Vitamin D per tablet. Take 1 tablet twice daily. You can only absorb about 500 mg calcium at a time.   2) Let me know if you run into cost concerns with the Trelegy.   Call me with any medication questions or concerns in the meantime!  [name], PharmD, [place], CPP Clinical Pharmacist [place] [facility] [phone]   Visit Information  PATIENT GOALS:  Goals Addressed              This Visit's Progress     Patient Stated   .  Medication Monitoring (pt-stated)        Patient Goals/Self-Care Activities . Over the next 90 days, patient will:  - take medications as prescribed collaborate with provider on medication access solutions       Consent to CCM Services: Ms. Sarah [name] was given information about Chronic Care Management services today including:  1. CCM service includes personalized support from designated clinical staff supervised by her physician, including individualized plan of care and coordination with other care providers 2. 24/7 contact phone numbers for assistance for urgent and routine care needs. 3. Service will only be billed when office clinical staff spend 20 minutes or more in a month to coordinate care. 4. Only one practitioner may furnish and bill the service in a calendar month. 5. The patient may stop CCM services at any time (effective at the end of the month) by phone call to the office staff. 6. The patient will be responsible for cost sharing (co-pay) of up to 20% of the service fee (after annual deductible is met).  Patient agreed to services and verbal consent obtained.   The patient verbalized understanding of instructions, educational materials, and care plan provided today and agreed to receive a mailed copy of patient instructions, educational materials, and care  plan.   Patient Goals/Self-Care Activities . Over the next 90 days, patient will:  - take medications as prescribed collaborate with provider on medication access solutions  CLINICAL CARE PLAN: Patient Care Plan: Medication Management    Problem Identified: Asthma, Neuropathy     Long-Range Goal: Disease Progression Prevention   Start Date: 07/15/2020  This Visit's Progress: On track  Priority: High  Note:   Current Barriers:  . Unable to independently afford treatment regimen  Pharmacist Clinical Goal(s):  Marland Kitchen Over the next 90 days, patient will verbalize ability to afford treatment regimen . Over the next 90 days, patient will achieve adherence to monitoring guidelines and medication adherence to achieve therapeutic efficacy through collaboration with PharmD and provider.   Interventions: . 1:1 collaboration with Crecencio Mc, MD regarding development and update of comprehensive plan of care as evidenced by provider attestation and co-signature . Inter-disciplinary care team collaboration (see longitudinal plan of care) . Comprehensive medication review performed; medication list updated in electronic medical record  Hyperlipidemia: . Uncontrolled; current treatment: none  . Medications previously tried: none . 10 year ASCVD risk score 10% . Uncertain benefit vs risk of statin therapy given age. Consider cardiac CT scoring to evaluate CV risk.   Asthma: . Controlled; current treatment: Trelegy 100/25/62.5 mcg 1 puff daily; has albuterol HFA and albuterol/ipratropium nebulizer, budesonide nebulizer, but has not required any rescue therapy since starting on Trelegy. Notes that "the person that did my PFT yesterday" recommended she  complete a breathing treatment before she goes out in the yard. She was unsure which nebulized medication would be preferable . PFT yesterday. Official interpretation pending . Denies cost concerns. Notes that copay is ~ $37. Patient meets income  requirements for Medicare Extra Help, but has an IRA that would put her over the asset limit. She would qualify for [place] patient assistance for Trelegy based on income, but must spend at least $600 on copays in the calendar year. Unsure that she will meet this given the fact that all other medications, besides gabapentin, are $0.  . Continue current regimen at this time. Continue collaboration with pulmonary. Will message Dr. Patsey Berthold to see if she has a recommendation for use of budesonide nebulized vs albuterol/ipratropium nebulized prior to outdoor activity as recommended by RT yesterday.   Depression . Controlled per patient report; current treatment: citalopram 20 mg daily  . Recommend to continue current regimen at this time  GERD: . Controlled per patient report; pantoprazole 40 mg QAM, famotidine 20 mg QPM . Continue current regimen at this time, avoid trigger foods.   Osteopenia: . Current treatment: calcium carbonate 600 mg + vitamin D (dose unknown), 2 tablets daily. No regular dairy sources . Discussed max absorption of calcium of ~ 500 mg at once. Would take 1 tab BID rather than 2 tabs at the same time . Discussed preference for calcium citrate (Citrical) due to concurrent PPI therapy. Patient will pick up a calcium citrate formulation  Neuropathy/Osteoarthritis Pain: . Improved per patient report; current regimen: gabapentin 100 mg QPM, taking acetaminophen 500 mg PRN (has switched away from ibuprofen) . Notes improvement in neuropathy, no issues with sedation since starting gabapentin. Notes that acetaminophen seems to provide less benefit on knee/shoulder pain than ibuprofen did . Discussed Voltaren OTC cream PRN up to QID. Patient notes she will consider trying this.   Hypothyroidism: . Controlled, current regimen: levothyroxine 50 mcg daily . Confirms appropriate administration . Continue current regimen at this time  Supplements: Marland Kitchen Vitamin B12 1000 mcg daily, reports  that she also picked up a B complex and has been taking both. Completing injectable B12 tomorrow. Endorses improvement in neuropathy . Discussed that she only needs B12 or B complex, as B complex contains B12. She verbalized understanding. Educated on relationship between low vitamin B12 levels and neuropathy.   Health Maintenance . Up to date on screenings and vaccinations . Notes today that she would like to schedule an appointment with Dr. Derrel Nip to discuss memory concerns. Will collaborate with office staff to outreach patient to schedule    Patient Goals/Self-Care Activities . Over the next 90 days, patient will:  - take medications as prescribed collaborate with provider on medication access solutions  Follow Up Plan: Telephone follow up appointment with care management team member scheduled for: ~ 12 weeks

## 2020-07-16 ENCOUNTER — Other Ambulatory Visit: Payer: Self-pay

## 2020-07-16 ENCOUNTER — Ambulatory Visit (INDEPENDENT_AMBULATORY_CARE_PROVIDER_SITE_OTHER): Payer: Medicare Other

## 2020-07-16 DIAGNOSIS — E538 Deficiency of other specified B group vitamins: Secondary | ICD-10-CM

## 2020-07-16 MED ORDER — CYANOCOBALAMIN 1000 MCG/ML IJ SOLN
1000.0000 ug | Freq: Once | INTRAMUSCULAR | Status: AC
  Administered 2020-07-16: 1000 ug via INTRAMUSCULAR

## 2020-07-16 NOTE — Progress Notes (Signed)
Patient presented for B 12 injection to left deltoid, patient voiced no concerns nor showed any signs of distress during injection. 

## 2020-07-31 ENCOUNTER — Ambulatory Visit: Payer: Medicare Other | Admitting: Internal Medicine

## 2020-08-06 ENCOUNTER — Encounter: Payer: Self-pay | Admitting: Internal Medicine

## 2020-08-06 ENCOUNTER — Ambulatory Visit (INDEPENDENT_AMBULATORY_CARE_PROVIDER_SITE_OTHER): Payer: Medicare Other | Admitting: Internal Medicine

## 2020-08-06 ENCOUNTER — Other Ambulatory Visit: Payer: Self-pay

## 2020-08-06 DIAGNOSIS — F4321 Adjustment disorder with depressed mood: Secondary | ICD-10-CM

## 2020-08-06 DIAGNOSIS — R419 Unspecified symptoms and signs involving cognitive functions and awareness: Secondary | ICD-10-CM

## 2020-08-06 NOTE — Progress Notes (Signed)
Subjective:  Patient ID: Sarah [name], female    DOB: 15-Apr-1951  Age: 70 y.o. MRN: [id number]  CC: Diagnoses of Grief reaction and Cognitive complaints were pertinent to this visit.  HPI Sarah [name] presents for evaluation of memory concerns.  This visit occurred during the SARS-CoV-2 public health emergency.  Safety protocols were in place, including screening questions prior to the visit, additional usage of staff PPE, and extensive cleaning of exam room while observing appropriate contact time as indicated for disinfecting solutions.    3  episodes of forgetting either where she was going  Or taking the wrong turn while driving and multitasking.. The episode of forgetting occurred when she took  a phone call from the office while heading to the hair stylist .  She admits that she has multiple stressors:  1) she continues to mourn the loss of her mother,  2) she had  to euthanize her beloved dog [name] on Monday, and 3) her good friend is dying of stage 4 CA    Outpatient Medications Prior to Visit  Medication Sig Dispense Refill  . acetaminophen (TYLENOL) 500 MG tablet Take 500 mg by mouth every 6 (six) hours as needed.    Marland Kitchen albuterol (PROVENTIL HFA;VENTOLIN HFA) 108 (90 Base) MCG/ACT inhaler Inhale 2 puffs into the lungs every 6 (six) hours as needed for wheezing or shortness of breath. 1 Inhaler 11  . budesonide (PULMICORT) 0.25 MG/2ML nebulizer solution Take 2 mLs (0.25 mg total) by nebulization 2 (two) times daily. 60 mL 12  . Calcium Carb-Cholecalciferol (CALCIUM 600 + D PO) Take 2 tablets by mouth daily.    . citalopram (CELEXA) 20 MG tablet TAKE ONE TABLET BY MOUTH EVERY DAY 30 tablet 4  . famotidine (PEPCID) 20 MG tablet TAKE 1 TABLET BY MOUTH DAILY. BEFORE DINNER (Patient taking differently: Take 20 mg by mouth daily.) 30 tablet 11  . fexofenadine (ALLEGRA) 180 MG tablet Take 180 mg by mouth daily.    . Fluticasone-Umeclidin-Vilant (TRELEGY ELLIPTA) 100-62.5-25 MCG/INH  AEPB Inhale 1 puff into the lungs daily.    Marland Kitchen gabapentin (NEURONTIN) 100 MG capsule Take 1 capsule (100 mg total) by mouth 3 (three) times daily. 90 capsule 3  . ipratropium-albuterol (DUONEB) 0.5-2.5 (3) MG/3ML SOLN Take 3 mLs by nebulization every 6 (six) hours as needed. 360 mL 2  . levothyroxine (SYNTHROID) 50 MCG tablet TAKE 1 TABLET EVERY DAY ON EMPTY STOMACHWITH A GLASS OF WATER AT LEAST 30-60 MINBEFORE BREAKFAST 90 tablet 1  . pantoprazole (PROTONIX) 40 MG tablet TAKE ONE TABLET BY MOUTH EVERY MORNING 30 MIN BEFORE BREAKFAST 30 tablet 11  . vitamin B-12 (CYANOCOBALAMIN) 1000 MCG tablet Take 1,000 mcg by mouth daily.     No facility-administered medications prior to visit.    Review of Systems;  Patient denies headache, fevers, malaise, unintentional weight loss, skin rash, eye pain, sinus congestion and sinus pain, sore throat, dysphagia,  hemoptysis , cough, dyspnea, wheezing, chest pain, palpitations, orthopnea, edema, abdominal pain, nausea, melena, diarrhea, constipation, flank pain, dysuria, hematuria, urinary  Frequency, nocturia, numbness, tingling, seizures,  Focal weakness, Loss of consciousness,  Tremor, insomnia, depression, anxiety, and suicidal ideation.      Objective:  BP 122/70 (BP Location: Left Arm, Patient Position: Sitting, Cuff Size: Normal)   Pulse 75   Temp 98.2 F (36.8 C) (Oral)   Resp 15   Ht 5\' 3"  (1.6 m)   Wt 176 lb 9.6 oz (80.1 kg)   SpO2 95%  BMI 31.28 kg/m   BP Readings from Last 3 Encounters:  08/06/20 122/70  06/12/20 128/76  05/19/20 120/78    Wt Readings from Last 3 Encounters:  08/06/20 176 lb 9.6 oz (80.1 kg)  06/12/20 177 lb 12.8 oz (80.6 kg)  05/19/20 178 lb 11.2 oz (81.1 kg)    General appearance: alert, cooperative and appears stated age Ears: normal TM's and external ear canals both ears Throat: lips, mucosa, and tongue normal; teeth and gums normal Neck: no adenopathy, no carotid bruit, supple, symmetrical, trachea midline  and thyroid not enlarged, symmetric, no tenderness/mass/nodules Back: symmetric, no curvature. ROM normal. No CVA tenderness. Lungs: clear to auscultation bilaterally Heart: regular rate and rhythm, S1, S2 normal, no murmur, click, rub or gallop Abdomen: soft, non-tender; bowel sounds normal; no masses,  no organomegaly Pulses: 2+ and symmetric Skin: Skin color, texture, turgor normal. No rashes or lesions Lymph nodes: Cervical, supraclavicular, and axillary nodes normal.  MMSE  28/30 (COULD NOT SPELL "WORLD FRONTWARDS OR BACKWARDS) but could calculate #nickels in $1.25 ;instant recall 2/3;  .3/3at one minute   Lab Results  Component Value Date   HGBA1C 6.0 06/12/2020   HGBA1C 5.9 04/08/2019   HGBA1C 5.8 (H) 06/29/2018    Lab Results  Component Value Date   CREATININE 0.94 06/12/2020   CREATININE 0.88 04/08/2019   CREATININE 0.80 06/29/2018    Lab Results  Component Value Date   WBC 5.8 06/12/2020   HGB 11.9 (L) 06/12/2020   HCT 35.1 (L) 06/12/2020   PLT 145.0 (L) 06/12/2020   GLUCOSE 104 (H) 06/12/2020   CHOL 221 (H) 06/12/2020   TRIG 63.0 06/12/2020   HDL 65.50 06/12/2020   LDLCALC 143 (H) 06/12/2020   ALT 13 06/12/2020   AST 11 06/12/2020   NA 140 06/12/2020   K 4.3 06/12/2020   CL 103 06/12/2020   CREATININE 0.94 06/12/2020   BUN 20 06/12/2020   CO2 31 06/12/2020   TSH 2.98 06/12/2020   HGBA1C 6.0 06/12/2020    Pulmonary Function Test [facility] Only  Result Date: 07/20/2020 Spirometry Data Is Acceptable and 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  Will consider change from celebrex to wellbutrin if symptoms persist      Cognitive complaints    Patient is concerned out cognitive decline because of forgetting.  She has no trouble remembering appointments, recipes, and medication regimen.  Has not gotten lost recently,  Had trouble balancing her checkbook, had any kitchen accidents caused by forgetfulness. MMSE was acceptable ,and screening labs for dementia were normal.  Current symptoms appear to be grief/anxiety induced. Continue celexa,  Advised to stop multi tasking so much , and return in 3 months          I am having Sarah [name] maintain her albuterol, fexofenadine, Calcium Carb-Cholecalciferol (CALCIUM 600 + D PO), famotidine, ipratropium-albuterol, budesonide, pantoprazole, citalopram, Trelegy Ellipta, gabapentin, levothyroxine, acetaminophen, and vitamin B-12.  No orders of the defined types were placed in this encounter.   There are no discontinued medications.  Follow-up: Return in about 6  months (around 02/05/2021).   Crecencio Mc, MD

## 2020-08-06 NOTE — Patient Instructions (Signed)
Your memory is fine.  It's your concentration that is suffering right now.  This can be aggravated by  grief and anxiety, both of which you are having .   TURN YOUR CELL PHONE RINGER  OFF WHEN YOU ARE DRIVING.   FOCUS ON DRIVING  TOO MUCH MULTI TASKING IS NOT HEALTHY

## 2020-08-09 DIAGNOSIS — F4321 Adjustment disorder with depressed mood: Secondary | ICD-10-CM | POA: Insufficient documentation

## 2020-08-09 DIAGNOSIS — R419 Unspecified symptoms and signs involving cognitive functions and awareness: Secondary | ICD-10-CM | POA: Insufficient documentation

## 2020-08-09 NOTE — Assessment & Plan Note (Signed)
Patient is concerned out cognitive decline because of forgetting.  She has no trouble remembering appointments, recipes, and medication regimen.  Has not gotten lost recently,  Had trouble balancing her checkbook, had any kitchen accidents caused by forgetfulness. MMSE was acceptable ,and screening labs for dementia were normal.  Current symptoms appear to be grief/anxiety induced. Continue celexa,  Advised to stop multi tasking so much , and return in 3 months

## 2020-08-09 NOTE — Assessment & Plan Note (Signed)
Reassured that her self perceived memory issues currently appear to be grief induced from prolonged bereavement after losing her mother several years ago, and now her beloved dog.  Will consider change from celebrex to wellbutrin if symptoms persist

## 2020-08-11 ENCOUNTER — Other Ambulatory Visit: Payer: Self-pay | Admitting: Internal Medicine

## 2020-08-11 NOTE — Telephone Encounter (Signed)
Pt would like a 90 day supply of pantoprazole (PROTONIX) 40 MG tablet and citalopram called in instead of 30 days

## 2020-10-07 ENCOUNTER — Ambulatory Visit (INDEPENDENT_AMBULATORY_CARE_PROVIDER_SITE_OTHER): Payer: Medicare Other | Admitting: Pharmacist

## 2020-10-07 ENCOUNTER — Other Ambulatory Visit: Payer: Self-pay

## 2020-10-07 DIAGNOSIS — E785 Hyperlipidemia, unspecified: Secondary | ICD-10-CM

## 2020-10-07 DIAGNOSIS — J4540 Moderate persistent asthma, uncomplicated: Secondary | ICD-10-CM

## 2020-10-07 DIAGNOSIS — F329 Major depressive disorder, single episode, unspecified: Secondary | ICD-10-CM

## 2020-10-07 DIAGNOSIS — K219 Gastro-esophageal reflux disease without esophagitis: Secondary | ICD-10-CM

## 2020-10-07 DIAGNOSIS — R7303 Prediabetes: Secondary | ICD-10-CM

## 2020-10-07 MED ORDER — TRELEGY ELLIPTA 100-62.5-25 MCG/INH IN AEPB: 1.0000 | INHALATION_SPRAY | Freq: Every day | RESPIRATORY_TRACT | 3 refills | Status: DC

## 2020-10-07 MED ORDER — CITALOPRAM HYDROBROMIDE 20 MG PO TABS: 20.0000 mg | ORAL_TABLET | Freq: Every day | ORAL | 1 refills | Status: DC

## 2020-10-07 NOTE — Progress Notes (Signed)
Medication has been refilled for 90 days

## 2020-10-07 NOTE — Chronic Care Management (AMB) (Signed)
  Chronic Care Management Pharmacy Note  10/07/2020 Name:  Sarah [name] MRN:  [id number] DOB:  04/26/1950   Subjective: Sarah [name] is an 70 y.o. year old female who is a primary patient of Tullo, Teresa L, MD.  The CCM team was consulted for assistance with disease management and care coordination needs.    Engaged with patient by telephone for follow up visit in response to provider referral for pharmacy case management and/or care coordination services.   Consent to Services:  The patient was given information about Chronic Care Management services, agreed to services, and gave verbal consent prior to initiation of services.  Please see initial visit note for detailed documentation.   Patient Care Team: Tullo, Teresa L, MD as PCP - General (Internal Medicine) Tullo, Teresa L, MD (Internal Medicine) Byrnett, Jeffrey W, MD (General Surgery) Travis, Catherine E, RPH-CPP (Pharmacist)  Recent office visits: 4/14 - PCP for memory concerns; multiple stressors lately. Consider focusing on particular tasks rather than multitasking  Recent consult visits: None recently  Hospital visits: None in previous 6 months  Objective:  Lab Results  Component Value Date   CREATININE 0.94 06/12/2020   CREATININE 0.88 04/08/2019   CREATININE 0.80 06/29/2018    Lab Results  Component Value Date   HGBA1C 6.0 06/12/2020   Last diabetic Eye exam: No results found for: HMDIABEYEEXA  Last diabetic Foot exam: No results found for: HMDIABFOOTEX      Component Value Date/Time   CHOL 221 (H) 06/12/2020 0836   TRIG 63.0 06/12/2020 0836   HDL 65.50 06/12/2020 0836   CHOLHDL 3 06/12/2020 0836   VLDL 12.6 06/12/2020 0836   LDLCALC 143 (H) 06/12/2020 0836   LDLCALC 142 (H) 04/06/2018 1520    Hepatic Function Latest Ref Rng & Units 06/12/2020 04/08/2019 04/06/2018  Total Protein 6.0 - 8.3 g/dL 6.5 6.9 6.9  Albumin 3.5 - 5.2 g/dL 4.1 4.2 -  AST 0 - 37 U/L 11 11 11  ALT 0 - 35 U/L 13 14  9  Alk Phosphatase 39 - 117 U/L 66 69 -  Total Bilirubin 0.2 - 1.2 mg/dL 0.3 0.5 0.5    Lab Results  Component Value Date/Time   TSH 2.98 06/12/2020 08:36 AM   TSH 2.91 04/08/2019 08:02 AM    CBC Latest Ref Rng & Units 06/12/2020 04/08/2019 06/29/2018  WBC 4.0 - 10.5 K/uL 5.8 6.3 4.3  Hemoglobin 12.0 - 15.0 g/dL 11.9(L) 12.2 11.6(L)  Hematocrit 36.0 - 46.0 % 35.1(L) 36.6 35.8(L)  Platelets 150.0 - 400.0 K/uL 145.0(L) 174.0 167    Lab Results  Component Value Date/Time   VD25OH 32.17 07/30/2015 01:27 PM    Clinical ASCVD: No  The 10-year ASCVD risk score (Goff DC Jr., et al., 2013) is: 9.6%   Values used to calculate the score:     Age: 70 years     Sex: Female     Is Non-Hispanic African American: No     Diabetic: No     Tobacco smoker: No     Systolic Blood Pressure: 122 mmHg     Is BP treated: No     HDL Cholesterol: 65.5 mg/dL     Total Cholesterol: 221 mg/dL     Social History   Tobacco Use  Smoking Status Never  Smokeless Tobacco Never   BP Readings from Last 3 Encounters:  08/06/20 122/70  06/12/20 128/76  05/19/20 120/78   Pulse Readings from Last 3 Encounters:  08/06/20 75    06/12/20 75  05/19/20 81   Wt Readings from Last 3 Encounters:  08/06/20 176 lb 9.6 oz (80.1 kg)  06/12/20 177 lb 12.8 oz (80.6 kg)  05/19/20 178 lb 11.2 oz (81.1 kg)    Assessment: Review of patient past medical history, allergies, medications, health status, including review of consultants reports, laboratory and other test data, was performed as part of comprehensive evaluation and provision of chronic care management services.   SDOH:  (Social Determinants of Health) assessments and interventions performed:  SDOH Interventions    Flowsheet Row Most Recent Value  SDOH Interventions   Financial Strain Interventions Other (Comment)  [consider patient assistance moving forward]       CCM Care Plan  No Known Allergies  Medications Reviewed Today     Reviewed by  Travis, Catherine E, RPH-CPP (Pharmacist) on 10/07/20 at 1038  Med List Status: <None>   Medication Order Taking? Sig Documenting Provider Last Dose Status Informant  acetaminophen (TYLENOL) 500 MG tablet [id number] Yes Take 500 mg by mouth every 6 (six) hours as needed. [provider] Taking Active   albuterol (PROVENTIL HFA;VENTOLIN HFA) 108 (90 Base) MCG/ACT inhaler [id number] Yes Inhale 2 puffs into the lungs every 6 (six) hours as needed for wheezing or shortness of breath. Tullo, Teresa L, MD Taking Active   budesonide (PULMICORT) 0.25 MG/2ML nebulizer solution [id number] No Take 2 mLs (0.25 mg total) by nebulization 2 (two) times daily.  Patient not taking: Reported on 10/07/2020   Tullo, Teresa L, MD Not Taking Active   Calcium Citrate-Vitamin D (CALCIUM CITRATE + D PO) [id number] Yes Take 1 tablet by mouth 2 (two) times daily. [provider] Taking Active   citalopram (CELEXA) 20 MG tablet [id number] Yes TAKE ONE TABLET BY MOUTH EVERY DAY Tullo, Teresa L, MD Taking Active   famotidine (PEPCID) 20 MG tablet [id number] Yes TAKE 1 TABLET BY MOUTH DAILY. BEFORE DINNER  Patient taking differently: Take 20 mg by mouth daily.   Tullo, Teresa L, MD Taking Active   fexofenadine (ALLEGRA) 180 MG tablet [id number] Yes Take 180 mg by mouth daily. [provider] Taking Active Self  Fluticasone-Umeclidin-Vilant (TRELEGY ELLIPTA) 100-62.5-25 MCG/INH AEPB [id number] Yes Inhale 1 puff into the lungs daily. [provider] Taking Active   gabapentin (NEURONTIN) 100 MG capsule [id number] Yes Take 1 capsule (100 mg total) by mouth 3 (three) times daily. Tullo, Teresa L, MD Taking Active            Med Note (TRAVIS, CATHERINE E   Wed Oct 07, 2020 10:37 AM) Taking QPM  ipratropium-albuterol (DUONEB) 0.5-2.5 (3) MG/3ML SOLN [id number] No Take 3 mLs by nebulization every 6 (six) hours as needed.  Patient not taking: Reported on 10/07/2020   Patel, Shreyang, MD Not Taking Active    levothyroxine (SYNTHROID) 50 MCG tablet [id number] Yes TAKE 1 TABLET EVERY DAY ON EMPTY STOMACHWITH A GLASS OF WATER AT LEAST 30-60 MINBEFORE BREAKFAST Tullo, Teresa L, MD Taking Active   pantoprazole (PROTONIX) 40 MG tablet [id number] Yes TAKE ONE TABLET BY MOUTH EVERY MORNING 30 MIN BEFORE BREAKFAST Tullo, Teresa L, MD Taking Active   vitamin B-12 (CYANOCOBALAMIN) 1000 MCG tablet [id number] Yes Take 1,000 mcg by mouth daily. [provider] Taking Active             Patient Active Problem List   Diagnosis Date Noted   Grief reaction 08/09/2020   Cognitive complaints 08/09/2020   Neuropathy 06/14/2020   Educated about COVID-19 virus infection 04/10/2019     Prediabetes 10/13/2018   LVH (left ventricular hypertrophy) due to hypertensive disease, without heart failure 10/13/2018   Chest tightness 06/26/2018   Asthma 06/04/2018   Hypothyroid 06/15/2017   Post herpetic neuralgia 06/06/2017   Encounter for preventive health examination 09/09/2014   Esophageal reflux 09/09/2014   Hyperlipidemia 08/17/2014   S/P hysterectomy 04/28/2013   Initial Medicare annual wellness visit 04/28/2013   Depression     Immunization History  Administered Date(s) Administered   Fluad Quad(high Dose 65+) 12/06/2018   Influenza, High Dose Seasonal PF 04/22/2016, 06/05/2017   Influenza,inj,Quad PF,6+ Mos 04/26/2013, 01/30/2015   Influenza-Unspecified 01/24/2018, 12/06/2018, 01/02/2020   Moderna Sars-Covid-2 Vaccination 06/20/2019, 07/22/2019, 02/14/2020   Pneumococcal Conjugate-13 09/01/2014, 01/24/2018   Pneumococcal Polysaccharide-23 04/26/2013, 02/28/2019   Tdap 06/18/2014   Zoster Recombinat (Shingrix) 07/08/2017, 09/22/2017    Conditions to be addressed/monitored: Depression and Pulmonary Disease  Care Plan : Medication Management  Updates made by Travis, Catherine E, RPH-CPP since 10/07/2020 12:00 AM     Problem: Asthma, Neuropathy      Long-Range Goal: Disease Progression  Prevention   Start Date: 07/15/2020  This Visit's Progress: On track  Recent Progress: On track  Priority: High  Note:   Current Barriers:  Unable to independently afford treatment regimen  Pharmacist Clinical Goal(s):  Over the next 90 days, patient will verbalize ability to afford treatment regimen Over the next 90 days, patient will achieve adherence to monitoring guidelines and medication adherence to achieve therapeutic efficacy through collaboration with PharmD and provider.   Interventions: 1:1 collaboration with Tullo, Teresa L, MD regarding development and update of comprehensive plan of care as evidenced by provider attestation and co-signature Inter-disciplinary care team collaboration (see longitudinal plan of care) Comprehensive medication review performed; medication list updated in electronic medical record  Health Maintenance: COVID vaccine x 3 Up to date on screenings and vaccinations Does note improvement in memory concerns when she focuses on the task at hand rather than trying to multitask as discussed w/ Dr. Tullo at last visit   Hyperlipidemia: Uncontrolled; current treatment: none  Medications previously tried: none 10 year ASCVD risk score 10% Uncertain benefit vs risk of statin therapy given age. Consider cardiac CT scoring to evaluate CV risk.   Asthma: Controlled per patient report; current treatment: Trelegy 100/25/62.5 mcg 1 puff daily; has albuterol HFA and albuterol/ipratropium nebulizer, budesonide nebulizer, but has not needed any recently Reports significant benefit of Trelegy on breathing (except when she is out in the heat) PFT 07/15/20: Mild restrictive lung disease without significant broncho-dilator response Patient requests 90 day supply of Trelegy. Sent today.  Continue current regimen at this time along with pulmonary collaboration  Depression Controlled per patient report; current treatment: citalopram 20 mg daily  Requests 90 day supply.  Will collaborate w/ PCP on this. Recommend to continue current regimen at this time  GERD: Controlled per patient report; pantoprazole 40 mg QAM, famotidine 20 mg QPM  Patient derives significant benefit from dual PPI and H2RA at this time. Notes when she forgets one of these two Recommend to continue current regimen at this time, avoid trigger foods.   Osteopenia: Current treatment: calcium citrate 600 mg + vitamin D 2 tablets daily. No regular dairy sources Last DEXA 09/2014: t score -2.2 at spine, -2.0 at femur; FRAX risk of major osteoporotic fracture 17.2%, hip fracture 2.7% Praised for changing to calcium citrate formulation given concurrent PPI administration. Continue current regimen at this time. Consider updated DEXA, especially given need for periodic steroid   use for asthma.  Neuropathy/Osteoarthritis Pain: Improved per patient report; current regimen: gabapentin 100 mg QPM, taking acetaminophen 500 mg PRN (has switched away from ibuprofen) Denies next day sedation with gabapentin  Previously discussed Voltaren OTC cream PRN up to QID Recommend to continue current regimen at this time   Hypothyroidism: Controlled, current regimen: levothyroxine 50 mcg daily Confirms appropriate administration Recommend to continue current regimen at this time  Supplements: Vitamin B12 1000 mcg daily, calcium citrate 600 mg + vitamin D as above   Patient Goals/Self-Care Activities Over the next 90 days, patient will:  - take medications as prescribed collaborate with provider on medication access solutions  Follow Up Plan: Telephone follow up appointment with care management team member scheduled for: ~ 16 weeks      Medication Assistance: None required.  Patient affirms current coverage meets needs.  Patient's preferred pharmacy is:  [facility] - [place], [place] - [address] [place] [address] Phone: [phone] Fax: [phone]    Follow  Up:  Patient agrees to Care Plan and Follow-up.  Plan: Telephone follow up appointment with care management team member scheduled for:  80 weeks  [name], PharmD, [place], [place] Clinical Pharmacist [employer] at [employer] [phone]

## 2020-10-07 NOTE — Patient Instructions (Signed)
Visit Information  PATIENT GOALS:  Goals Addressed               This Visit's Progress     Patient Stated     Medication Monitoring (pt-stated)        Patient Goals/Self-Care Activities Over the next 90 days, patient will:  - take medications as prescribed collaborate with provider on medication access solutions          Patient verbalizes understanding of instructions provided today and agrees to view in [place].   Plan: Telephone follow up appointment with care management team member scheduled for:  52 weeks  [name], PharmD, [place], [place] Clinical Pharmacist [employer] at [employer] [phone]

## 2020-11-25 ENCOUNTER — Telehealth: Payer: Self-pay | Admitting: Internal Medicine

## 2020-11-25 NOTE — Telephone Encounter (Signed)
Patient has appointment tomorrow with Dr Olivia Mackie Mclean-scocuzza.

## 2020-11-25 NOTE — Telephone Encounter (Signed)
Patient informed, Due to the high volume of calls and your symptoms we have to forward your call to our Triage Nurse to expedient your call. Please hold for the transfer.  Patient transferred to [facility] at Access Nurse. Patient has been sick for a week. She said she tested negative for covid. She has a cough, and bad congestion

## 2020-11-26 ENCOUNTER — Other Ambulatory Visit: Payer: Self-pay

## 2020-11-26 ENCOUNTER — Encounter: Payer: Self-pay | Admitting: Internal Medicine

## 2020-11-26 ENCOUNTER — Telehealth (INDEPENDENT_AMBULATORY_CARE_PROVIDER_SITE_OTHER): Payer: Medicare Other | Admitting: Internal Medicine

## 2020-11-26 VITALS — Ht 63.0 in | Wt 171.0 lb

## 2020-11-26 DIAGNOSIS — Z03818 Encounter for observation for suspected exposure to other biological agents ruled out: Secondary | ICD-10-CM | POA: Diagnosis not present

## 2020-11-26 DIAGNOSIS — R0981 Nasal congestion: Secondary | ICD-10-CM

## 2020-11-26 DIAGNOSIS — Z20822 Contact with and (suspected) exposure to covid-19: Secondary | ICD-10-CM | POA: Diagnosis not present

## 2020-11-26 DIAGNOSIS — J029 Acute pharyngitis, unspecified: Secondary | ICD-10-CM

## 2020-11-26 DIAGNOSIS — J4541 Moderate persistent asthma with (acute) exacerbation: Secondary | ICD-10-CM

## 2020-11-26 DIAGNOSIS — R059 Cough, unspecified: Secondary | ICD-10-CM

## 2020-11-26 MED ORDER — BUDESONIDE 0.25 MG/2ML IN SUSP: 0.2500 mg | Freq: Two times a day (BID) | RESPIRATORY_TRACT | 12 refills | Status: DC

## 2020-11-26 MED ORDER — AZITHROMYCIN 250 MG PO TABS: ORAL_TABLET | ORAL | 0 refills | Status: AC

## 2020-11-26 MED ORDER — ACETAMINOPHEN 325 MG PO TABS: 650.0000 mg | ORAL_TABLET | Freq: Three times a day (TID) | ORAL | 3 refills | Status: DC | PRN

## 2020-11-26 MED ORDER — SALINE SPRAY 0.65 % NA SOLN: 2.0000 | NASAL | 2 refills | Status: DC | PRN

## 2020-11-26 MED ORDER — CEPACOL SORE THROAT 15-3.6 MG MT LOZG: 1.0000 | LOZENGE | Freq: Two times a day (BID) | OROMUCOSAL | 0 refills | Status: DC | PRN

## 2020-11-26 MED ORDER — ALBUTEROL SULFATE HFA 108 (90 BASE) MCG/ACT IN AERS: 2.0000 | INHALATION_SPRAY | Freq: Four times a day (QID) | RESPIRATORY_TRACT | 11 refills | Status: DC | PRN

## 2020-11-26 MED ORDER — PREDNISONE 10 MG (21) PO TBPK: ORAL_TABLET | ORAL | 0 refills | Status: DC

## 2020-11-26 MED ORDER — IPRATROPIUM-ALBUTEROL 0.5-2.5 (3) MG/3ML IN SOLN: 3.0000 mL | Freq: Four times a day (QID) | RESPIRATORY_TRACT | 2 refills | Status: DC | PRN

## 2020-11-26 NOTE — Progress Notes (Signed)
Telephone Note  I connected with Sarah [name]  on 11/26/20 at  8:00 AM EDT by a telephone and verified that I am speaking with the correct person using two identifiers.  Location patient: home, [place] Location provider:work or home office Persons participating in the virtual visit: patient, provider  I discussed the limitations of evaluation and management by telemedicine and the availability of in person appointments. The patient expressed understanding and agreed to proceed.  Acute visit -weds/Thursday last well cough, coungestion, dark bronw phlegm, bloody nose, sore throat mowed yard yesterday but worse a mask has h/o asthma uses trelegy not prn albuterol has expired pulmocort and duoneb tried mucinex night and day only   -COVID-19 vaccine status: 3/3 moderna  ROS: See pertinent positives and negatives per HPI.  Past Medical History:  Diagnosis Date   Arthritis    Asthma    Depression    Dyspnea    with heat   Environmental and seasonal allergies    GERD (gastroesophageal reflux disease)    Presence of dental prosthetic device    Dental implants   Wheezing     Past Surgical History:  Procedure Laterality Date   ABDOMINAL HYSTERECTOMY  1989   BROW LIFT Bilateral 02/07/2020   Procedure: BLEPHAROPLASTY UPPER EYELID; W/EXCESS SKIN BROW PTOSIS REPAIR BILATERAL;  Surgeon: Karle Starch, MD;  Location: [facility];  Service: Ophthalmology;  Laterality: Bilateral;   CATARACT EXTRACTION W/PHACO Right 09/13/2016   Procedure: CATARACT EXTRACTION PHACO AND INTRAOCULAR LENS PLACEMENT (IOC);  Surgeon: Birder Robson, MD;  Location: [facility];  Service: Ophthalmology;  Laterality: Right;  [place] 00:38 AP% 14.3 CDE 5.51 Fluid pack lot # [id number]   CATARACT EXTRACTION W/PHACO Left 10/11/2016   Procedure: CATARACT EXTRACTION PHACO AND INTRAOCULAR LENS PLACEMENT (IOC);  Surgeon: Birder Robson, MD;  Location: [facility];  Service: Ophthalmology;  Laterality: Left;  [place] 00:30 AP%  11.3 CDE 3.50 fluid pack lot # [id number]   COLONOSCOPY WITH PROPOFOL N/A 09/30/2014   Procedure: COLONOSCOPY WITH PROPOFOL;  Surgeon: Robert Bellow, MD;  Location: [facility] ENDOSCOPY;  Service: Endoscopy;  Laterality: N/A;   ESOPHAGOGASTRODUODENOSCOPY N/A 09/30/2014   Procedure: ESOPHAGOGASTRODUODENOSCOPY (EGD);  Surgeon: Robert Bellow, MD;  Location: [facility] ENDOSCOPY;  Service: Endoscopy;  Laterality: N/A;   FOOT SURGERY Left 1985   KNEE SURGERY  2016     Current Outpatient Medications:    acetaminophen (TYLENOL) 325 MG tablet, Take 2 tablets (650 mg total) by mouth 3 (three) times daily as needed., Disp: 90 tablet, Rfl: 3   acetaminophen (TYLENOL) 500 MG tablet, Take 500 mg by mouth every 6 (six) hours as needed., Disp: , Rfl:    azithromycin (ZITHROMAX) 250 MG tablet, Take 2 tablets on day 1, then 1 tablet daily on days 2 through 5 with food, Disp: 6 tablet, Rfl: 0   Benzocaine-Menthol (CEPACOL SORE THROAT) 15-3.6 MG LOZG, Use as directed 1 lozenge in the mouth or throat 2 (two) times daily as needed., Disp: 50 lozenge, Rfl: 0   Calcium Citrate-Vitamin D (CALCIUM CITRATE + D PO), Take 1 tablet by mouth 2 (two) times daily., Disp: , Rfl:    citalopram (CELEXA) 20 MG tablet, Take 1 tablet (20 mg total) by mouth daily., Disp: 90 tablet, Rfl: 1   famotidine (PEPCID) 20 MG tablet, TAKE 1 TABLET BY MOUTH DAILY. BEFORE DINNER (Patient taking differently: Take 20 mg by mouth daily.), Disp: 30 tablet, Rfl: 11   fexofenadine (ALLEGRA) 180 MG tablet, Take 180 mg by mouth daily.,  Disp: , Rfl:    Fluticasone-Umeclidin-Vilant (TRELEGY ELLIPTA) 100-62.5-25 MCG/INH AEPB, Inhale 1 puff into the lungs daily. 90 day supply, Disp: 60 each, Rfl: 3   gabapentin (NEURONTIN) 100 MG capsule, Take 1 capsule (100 mg total) by mouth 3 (three) times daily., Disp: 90 capsule, Rfl: 3   levothyroxine (SYNTHROID) 50 MCG tablet, TAKE 1 TABLET EVERY DAY ON EMPTY STOMACHWITH A GLASS OF WATER AT LEAST 30-60 MINBEFORE BREAKFAST,  Disp: 90 tablet, Rfl: 1   pantoprazole (PROTONIX) 40 MG tablet, TAKE ONE TABLET BY MOUTH EVERY MORNING 30 MIN BEFORE BREAKFAST, Disp: 90 tablet, Rfl: 3   predniSONE (STERAPRED UNI-PAK 21 TAB) 10 MG (21) TBPK tablet, 6 tablets daily x 2,  Reduce by 1 tablet every 2 days until gone, Disp: 42 tablet, Rfl: 0   sodium chloride (OCEAN) 0.65 % SOLN nasal spray, Place 2 sprays into both nostrils as needed for congestion., Disp: 30 mL, Rfl: 2   vitamin B-12 (CYANOCOBALAMIN) 1000 MCG tablet, Take 1,000 mcg by mouth daily., Disp: , Rfl:    albuterol (VENTOLIN HFA) 108 (90 Base) MCG/ACT inhaler, Inhale 2 puffs into the lungs every 6 (six) hours as needed for wheezing or shortness of breath., Disp: 1 each, Rfl: 11   budesonide (PULMICORT) 0.25 MG/2ML nebulizer solution, Take 2 mLs (0.25 mg total) by nebulization 2 (two) times daily., Disp: 60 mL, Rfl: 12   ipratropium-albuterol (DUONEB) 0.5-2.5 (3) MG/3ML SOLN, Take 3 mLs by nebulization every 6 (six) hours as needed., Disp: 360 mL, Rfl: 2  EXAM:  VITALS per patient if applicable:  GENERAL: alert, oriented, appears well and in no acute distress   PSYCH/NEURO: pleasant and cooperative, no obvious depression or anxiety, speech and thought processing grossly intact  ASSESSMENT AND PLAN:  Discussed the following assessment and plan:  Moderate persistent asthma with acute exacerbation - Plan: predniSONE (STERAPRED UNI-PAK 21 TAB) 10 MG (21) TBPK tablet, azithromycin (ZITHROMAX) 250 MG tablet, ipratropium-albuterol (DUONEB) 0.5-2.5 (3) MG/3ML SOLN, albuterol (VENTOLIN HFA) 108 (90 Base) MCG/ACT inhaler, budesonide (PULMICORT) 0.25 MG/2ML nebulizer solution Prn trelegy, albuterol  Rec covid test [facility] home test neg   Sore throat - Plan: acetaminophen (TYLENOL) 325 MG tablet, Benzocaine-Menthol (CEPACOL SORE THROAT) 15-3.6 MG LOZG  Nasal congestion - Plan: sodium chloride (OCEAN) 0.65 % SOLN nasal spray  -we discussed possible serious and likely  etiologies, options for evaluation and workup, limitations of telemedicine visit vs in person visit, treatment, treatment risks and precautions. Pt prefers to treat via telemedicine empirically rather than in person at this moment.     I discussed the assessment and treatment plan with the patient. The patient was provided an opportunity to ask questions and all were answered. The patient agreed with the plan and demonstrated an understanding of the instructions.    Time spent 20 min Delorise Jackson, MD

## 2020-11-26 NOTE — Progress Notes (Signed)
Onset of symptoms a week ago. Tested negative for COVID via home test. No one around the Patient is sick. Patient has asthma no history of allergies.   Patient having cough, runny nose, dark brown phlegm, bloody nose, fatigue, and sore throat.

## 2020-12-03 ENCOUNTER — Telehealth: Payer: Self-pay | Admitting: Internal Medicine

## 2020-12-03 NOTE — Telephone Encounter (Signed)
Patient had a virtual with Dr Olivia Mackie on 11/26/20. She is feeling a little better, but is coughing up a lot of mucus, sometimes yellow. She would like to know if she needs a antibiotic.

## 2020-12-04 ENCOUNTER — Ambulatory Visit
Admission: RE | Admit: 2020-12-04 | Discharge: 2020-12-04 | Disposition: A | Discharge status: Home / Self Care | Payer: Medicare Other | Source: Ambulatory Visit | Attending: Internal Medicine | Admitting: Internal Medicine

## 2020-12-04 ENCOUNTER — Telehealth: Payer: Self-pay | Admitting: Pulmonary Disease

## 2020-12-04 ENCOUNTER — Ambulatory Visit
Admission: RE | Admit: 2020-12-04 | Discharge: 2020-12-04 | Disposition: A | Discharge status: Home / Self Care | Payer: Medicare Other | Attending: Internal Medicine | Admitting: Internal Medicine

## 2020-12-04 DIAGNOSIS — R059 Cough, unspecified: Secondary | ICD-10-CM | POA: Insufficient documentation

## 2020-12-04 DIAGNOSIS — J4541 Moderate persistent asthma with (acute) exacerbation: Secondary | ICD-10-CM

## 2020-12-04 DIAGNOSIS — I517 Cardiomegaly: Secondary | ICD-10-CM | POA: Diagnosis not present

## 2020-12-04 DIAGNOSIS — J45909 Unspecified asthma, uncomplicated: Secondary | ICD-10-CM | POA: Diagnosis not present

## 2020-12-04 NOTE — Telephone Encounter (Signed)
Patient was informed.  Patient understood and no questions, comments, or concerns at this time.  

## 2020-12-04 NOTE — Addendum Note (Signed)
Addended by: Orland Mustard on: 12/04/2020 09:05 AM   Modules accepted: Orders

## 2020-12-04 NOTE — Telephone Encounter (Signed)
She has inhalers, neb tx, given zpack and steroids  Some times cough can last up to 2 months  Rec mucinex or robitussin dm  Sch f/u with PCP Have pt do CXR at [facility] soon

## 2020-12-08 MED ORDER — TRELEGY ELLIPTA 100-62.5-25 MCG/INH IN AEPB: 100.0000 ug | INHALATION_SPRAY | Freq: Every day | RESPIRATORY_TRACT | 0 refills | Status: DC

## 2020-12-08 NOTE — Telephone Encounter (Signed)
Trelegy 100 left at front desk, for pick up. Nothing further needed.

## 2020-12-08 NOTE — Telephone Encounter (Signed)
Checked with Marissa at [place] office to see if there were any samples of Trelegy 100 and she said there was. Called and spoke with pt letting her know this and she verbalized understanding.  Pt said that she would try to come by the office either Thursday/Friday of this week to get the sample.  Routing to [place] so that way she can document the sample.

## 2020-12-10 ENCOUNTER — Other Ambulatory Visit: Payer: Self-pay

## 2020-12-10 ENCOUNTER — Ambulatory Visit (INDEPENDENT_AMBULATORY_CARE_PROVIDER_SITE_OTHER): Payer: Medicare Other | Admitting: Internal Medicine

## 2020-12-10 ENCOUNTER — Encounter: Payer: Self-pay | Admitting: Internal Medicine

## 2020-12-10 VITALS — BP 140/74 | HR 90 | Temp 96.9°F | Resp 15 | Ht 63.0 in | Wt 185.4 lb

## 2020-12-10 DIAGNOSIS — R7303 Prediabetes: Secondary | ICD-10-CM

## 2020-12-10 DIAGNOSIS — R5383 Other fatigue: Secondary | ICD-10-CM | POA: Diagnosis not present

## 2020-12-10 DIAGNOSIS — J301 Allergic rhinitis due to pollen: Secondary | ICD-10-CM | POA: Diagnosis not present

## 2020-12-10 DIAGNOSIS — F5101 Primary insomnia: Secondary | ICD-10-CM

## 2020-12-10 DIAGNOSIS — E538 Deficiency of other specified B group vitamins: Secondary | ICD-10-CM | POA: Diagnosis not present

## 2020-12-10 DIAGNOSIS — D649 Anemia, unspecified: Secondary | ICD-10-CM

## 2020-12-10 LAB — COMPREHENSIVE METABOLIC PANEL
ALT: 18 U/L (ref 0–35)
AST: 15 U/L (ref 0–37)
Albumin: 3.7 g/dL (ref 3.5–5.2)
Alkaline Phosphatase: 68 U/L (ref 39–117)
BUN: 26 mg/dL — ABNORMAL HIGH (ref 6–23)
CO2: 32 mEq/L (ref 19–32)
Calcium: 9.5 mg/dL (ref 8.4–10.5)
Chloride: 100 mEq/L (ref 96–112)
Creatinine, Ser: 0.83 mg/dL (ref 0.40–1.20)
GFR: 70.93 mL/min (ref >60.00)
Glucose, Bld: 98 mg/dL (ref 70–99)
Potassium: 4.5 mEq/L (ref 3.5–5.1)
Sodium: 138 mEq/L (ref 135–145)
Total Bilirubin: 0.5 mg/dL (ref 0.2–1.2)
Total Protein: 6.3 g/dL (ref 6.0–8.3)

## 2020-12-10 LAB — VITAMIN B12: Vitamin B-12: 722 pg/mL (ref 211–911)

## 2020-12-10 LAB — CBC WITH DIFFERENTIAL/PLATELET
Basophils Absolute: 0.1 10*3/uL (ref 0.0–0.1)
Basophils Relative: 1.9 % (ref 0.0–3.0)
Eosinophils Absolute: 0.3 10*3/uL (ref 0.0–0.7)
Eosinophils Relative: 4.6 % (ref 0.0–5.0)
HCT: 31.8 % — ABNORMAL LOW (ref 36.0–46.0)
Hemoglobin: 10.7 g/dL — ABNORMAL LOW (ref 12.0–15.0)
Lymphocytes Relative: 26.3 % (ref 12.0–46.0)
Lymphs Abs: 1.8 10*3/uL (ref 0.7–4.0)
MCHC: 33.7 g/dL (ref 30.0–36.0)
MCV: 103.5 fl — ABNORMAL HIGH (ref 78.0–100.0)
Monocytes Absolute: 0.7 10*3/uL (ref 0.1–1.0)
Monocytes Relative: 9.8 % (ref 3.0–12.0)
Neutro Abs: 3.9 10*3/uL (ref 1.4–7.7)
Neutrophils Relative %: 57.4 % (ref 43.0–77.0)
Platelets: 131 10*3/uL — ABNORMAL LOW (ref 150.0–400.0)
RBC: 3.07 Mil/uL — ABNORMAL LOW (ref 3.87–5.11)
RDW: 14.3 % (ref 11.5–15.5)
WBC: 6.7 10*3/uL (ref 4.0–10.5)

## 2020-12-10 LAB — HEMOGLOBIN A1C: Hgb A1c MFr Bld: 6.4 % (ref 4.6–6.5)

## 2020-12-10 LAB — TSH: TSH: 2.08 u[IU]/mL (ref 0.35–5.50)

## 2020-12-10 MED ORDER — TRAZODONE HCL 50 MG PO TABS: 25.0000 mg | ORAL_TABLET | Freq: Every evening | ORAL | 3 refills | Status: DC | PRN

## 2020-12-10 NOTE — Progress Notes (Signed)
Subjective:  Patient ID: Sarah [name], female    DOB: 1950/05/21  Age: 70 y.o. MRN: [id number]  CC: The primary encounter diagnosis was B12 deficiency. Diagnoses of Fatigue, unspecified type, Prediabetes, Non-seasonal allergic rhinitis due to pollen, Other fatigue, and Primary insomnia were also pertinent to this visit.  HPI Sarah [name] presents for treatment of chronic sinusitis and other issues  This visit occurred during the SARS-CoV-2 public health emergency.  Safety protocols were in place, including screening questions prior to the visit, additional usage of staff PPE, and extensive cleaning of exam room while observing appropriate contact time as indicated for disinfecting solutions.   She has had sinus Congestion and productive cough  for 4 weeks.  Covid tested several times , all negative.  Treated by colleague with prednisone taper and antibiotic ,  finished meds on Monday ,  symptoms  resolved,  except for fatigue.  Feels tired constantly,  even when she wakes up.  Not sleeping well even though nocturnal cough has resolved .  Symptoms aggravated by mowing grass yesterday without a mask.  Taking fexofenadine daily   Weight gain.  Does not weigh at home.  Cardiomegaly  noted on chest x ray .  LVH on 2020 ECHO  Using trilegy for management of COPD/asthma.   Insomnia:  falls asleep to the TV but wakes up frequently    Outpatient Medications Prior to Visit  Medication Sig Dispense Refill   acetaminophen (TYLENOL) 325 MG tablet Take 2 tablets (650 mg total) by mouth 3 (three) times daily as needed. 90 tablet 3   acetaminophen (TYLENOL) 500 MG tablet Take 500 mg by mouth every 6 (six) hours as needed.     albuterol (VENTOLIN HFA) 108 (90 Base) MCG/ACT inhaler Inhale 2 puffs into the lungs every 6 (six) hours as needed for wheezing or shortness of breath. 1 each 11   Benzocaine-Menthol (CEPACOL SORE THROAT) 15-3.6 MG LOZG Use as directed 1 lozenge in the mouth or throat 2 (two)  times daily as needed. 50 lozenge 0   budesonide (PULMICORT) 0.25 MG/2ML nebulizer solution Take 2 mLs (0.25 mg total) by nebulization 2 (two) times daily. 60 mL 12   Calcium Citrate-Vitamin D (CALCIUM CITRATE + D PO) Take 1 tablet by mouth 2 (two) times daily.     citalopram (CELEXA) 20 MG tablet Take 1 tablet (20 mg total) by mouth daily. 90 tablet 1   famotidine (PEPCID) 20 MG tablet TAKE 1 TABLET BY MOUTH DAILY. BEFORE DINNER (Patient taking differently: Take 20 mg by mouth daily.) 30 tablet 11   fexofenadine (ALLEGRA) 180 MG tablet Take 180 mg by mouth daily.     Fluticasone-Umeclidin-Vilant (TRELEGY ELLIPTA) 100-62.5-25 MCG/INH AEPB Inhale 1 puff into the lungs daily. 90 day supply 60 each 3   Fluticasone-Umeclidin-Vilant (TRELEGY ELLIPTA) 100-62.5-25 MCG/INH AEPB Inhale 100 mcg into the lungs daily. 28 each 0   gabapentin (NEURONTIN) 100 MG capsule Take 1 capsule (100 mg total) by mouth 3 (three) times daily. 90 capsule 3   ipratropium-albuterol (DUONEB) 0.5-2.5 (3) MG/3ML SOLN Take 3 mLs by nebulization every 6 (six) hours as needed. 360 mL 2   levothyroxine (SYNTHROID) 50 MCG tablet TAKE 1 TABLET EVERY DAY ON EMPTY STOMACHWITH A GLASS OF WATER AT LEAST 30-60 MINBEFORE BREAKFAST 90 tablet 1   pantoprazole (PROTONIX) 40 MG tablet TAKE ONE TABLET BY MOUTH EVERY MORNING 30 MIN BEFORE BREAKFAST 90 tablet 3   sodium chloride (OCEAN) 0.65 % SOLN nasal spray Place 2  sprays into both nostrils as needed for congestion. 30 mL 2   vitamin B-12 (CYANOCOBALAMIN) 1000 MCG tablet Take 1,000 mcg by mouth daily.     predniSONE (STERAPRED UNI-PAK 21 TAB) 10 MG (21) TBPK tablet 6 tablets daily x 2,  Reduce by 1 tablet every 2 days until gone (Patient not taking: Reported on 12/10/2020) 42 tablet 0   No facility-administered medications prior to visit.    Review of Systems;  Patient denies headache, fevers, malaise, unintentional weight loss, skin rash, eye pain, sinus congestion and sinus pain, sore throat,  dysphagia,  hemoptysis , cough, dyspnea, wheezing, chest pain, palpitations, orthopnea, edema, abdominal pain, nausea, melena, diarrhea, constipation, flank pain, dysuria, hematuria, urinary  Frequency, nocturia, numbness, tingling, seizures,  Focal weakness, Loss of consciousness,  Tremor, insomnia, depression, anxiety, and suicidal ideation.      Objective:  BP 140/74 (BP Location: Left Arm, Patient Position: Sitting, Cuff Size: Normal)   Pulse 90   Temp (!) 96.9 F (36.1 C) (Temporal)   Resp 15   Ht '5\' 3"'$  (1.6 m)   Wt 185 lb 6.4 oz (84.1 kg)   SpO2 97%   BMI 32.84 kg/m   BP Readings from Last 3 Encounters:  12/10/20 140/74  08/06/20 122/70  06/12/20 128/76    Wt Readings from Last 3 Encounters:  12/10/20 185 lb 6.4 oz (84.1 kg)  11/26/20 171 lb (77.6 kg)  08/06/20 176 lb 9.6 oz (80.1 kg)    General appearance: alert, cooperative and appears stated age Ears: normal TM's and external ear canals both ears Throat: lips, mucosa, and tongue normal; teeth and gums normal Neck: no adenopathy, no carotid bruit, supple, symmetrical, trachea midline and thyroid not enlarged, symmetric, no tenderness/mass/nodules Back: symmetric, no curvature. ROM normal. No CVA tenderness. Lungs: clear to auscultation bilaterally Heart: regular rate and rhythm, S1, S2 normal, no murmur, click, rub or gallop Abdomen: soft, non-tender; bowel sounds normal; no masses,  no organomegaly Pulses: 2+ and symmetric Skin: Skin color, texture, turgor normal. No rashes or lesions Lymph nodes: Cervical, supraclavicular, and axillary nodes normal.  Lab Results  Component Value Date   HGBA1C 6.4 12/10/2020   HGBA1C 6.0 06/12/2020   HGBA1C 5.9 04/08/2019    Lab Results  Component Value Date   CREATININE 0.83 12/10/2020   CREATININE 0.94 06/12/2020   CREATININE 0.88 04/08/2019    Lab Results  Component Value Date   WBC 6.7 12/10/2020   HGB 10.7 (L) 12/10/2020   HCT 31.8 (L) 12/10/2020   PLT 131.0  (L) 12/10/2020   GLUCOSE 98 12/10/2020   CHOL 221 (H) 06/12/2020   TRIG 63.0 06/12/2020   HDL 65.50 06/12/2020   LDLCALC 143 (H) 06/12/2020   ALT 18 12/10/2020   AST 15 12/10/2020   NA 138 12/10/2020   K 4.5 12/10/2020   CL 100 12/10/2020   CREATININE 0.83 12/10/2020   BUN 26 (H) 12/10/2020   CO2 32 12/10/2020   TSH 2.08 12/10/2020   HGBA1C 6.4 12/10/2020    DG Chest 2 View  Result Date: 12/05/2020 CLINICAL DATA:  Cough and asthma.  Rule out pneumonia. EXAM: CHEST - 2 VIEW COMPARISON:  November 27, 2019 FINDINGS: Mild cardiomegaly, unchanged. The hila and mediastinum are normal. No pneumothorax. No nodules or masses. No focal infiltrates. IMPRESSION: No active cardiopulmonary disease. Electronically Signed   By: Dorise Bullion III M.D.   On: 12/05/2020 20:19    Assessment & Plan:   Problem List Items Addressed This Visit  Unprioritized   Allergic rhinitis    Advised to increase fexofenadine to every 12 hours       Fatigue    Etiology unclear.  Thyroid function is normal,  She has a mild macrocytic anemia and thrombocytopenia, but her b12 is normal.  Will treat her insomnia and reassess.  If fatigue persists despite resolution of insomnia,   She will need a sleep study   Lab Results  Component Value Date   TSH 2.08 12/10/2020   Lab Results  Component Value Date   [id number] 12/10/2020   Lab Results  Component Value Date   WBC 6.7 12/10/2020   HGB 10.7 (L) 12/10/2020   HCT 31.8 (L) 12/10/2020   MCV 103.5 (H) 12/10/2020   PLT 131.0 (L) 12/10/2020         Relevant Orders   CBC with Differential/Platelet (Completed)   TSH (Completed)   Comprehensive metabolic panel (Completed)   Insomnia    Trial of trazodone starting at 25 mg qhs      Prediabetes    a1c has risen to 6.4 but fasting glucoses remain normal. I have addressed  BMI and recommended wt loss of 10% of body weigh over the next 6 months using a low glycemic index diet and regular exercise a  minimum of 5 days per week.        Relevant Orders   Hemoglobin A1c (Completed)   Other Visit Diagnoses     B12 deficiency    -  Primary   Relevant Orders   Vitamin B12 (Completed)       Meds ordered this encounter  Medications   traZODone (DESYREL) 50 MG tablet    Sig: Take 0.5-1 tablets (25-50 mg total) by mouth at bedtime as needed for sleep.    Dispense:  30 tablet    Refill:  3    Medications Discontinued During This Encounter  Medication Reason   predniSONE (STERAPRED UNI-PAK 21 TAB) 10 MG (21) TBPK tablet     Follow-up: Return in about 3 months (around 03/12/2021).   Crecencio Mc, MD

## 2020-12-10 NOTE — Patient Instructions (Addendum)
Increase your allegra to every 12 hours for your congestion   Please check your blood pressure a few times at home and send me the readings so I can determine if you need to start a medication to lower your blood pressure .  ALWAYS ALWAYS WEAR A MASK WHEN MOWING.  TRIAL OF TRAZODONE FOR INSOMNIA.  START WITH HALF TABLET 30 MINUTES BEFORE BEDTIME.  INCREASE GRADUALLY IF NEEDED,  MAXIMAL DOSE 100 MG DAILY

## 2020-12-13 DIAGNOSIS — J309 Allergic rhinitis, unspecified: Secondary | ICD-10-CM | POA: Insufficient documentation

## 2020-12-13 DIAGNOSIS — G4700 Insomnia, unspecified: Secondary | ICD-10-CM | POA: Insufficient documentation

## 2020-12-13 DIAGNOSIS — R5383 Other fatigue: Secondary | ICD-10-CM | POA: Insufficient documentation

## 2020-12-13 NOTE — Assessment & Plan Note (Signed)
Advised to increase fexofenadine to every 12 hours

## 2020-12-13 NOTE — Assessment & Plan Note (Signed)
Trial of trazodone starting at 25 mg qhs

## 2020-12-13 NOTE — Assessment & Plan Note (Addendum)
Etiology unclear.  Thyroid function is normal,  She has a mild macrocytic anemia and thrombocytopenia, but her b12 is normal.  Will treat her insomnia and reassess.  If fatigue persists despite resolution of insomnia,   She will need a sleep study   Lab Results  Component Value Date   TSH 2.08 12/10/2020   Lab Results  Component Value Date   VITAMINB12 722 12/10/2020   Lab Results  Component Value Date   WBC 6.7 12/10/2020   HGB 10.7 (L) 12/10/2020   HCT 31.8 (L) 12/10/2020   MCV 103.5 (H) 12/10/2020   PLT 131.0 (L) 12/10/2020

## 2020-12-13 NOTE — Assessment & Plan Note (Signed)
a1c has risen to 6.4 but fasting glucoses remain normal. I have addressed  BMI and recommended wt loss of 10% of body weigh over the next 6 months using a low glycemic index diet and regular exercise a minimum of 5 days per week.

## 2020-12-15 NOTE — Addendum Note (Signed)
Addended by: Crecencio Mc on: 12/15/2020 10:37 AM   Modules accepted: Orders

## 2020-12-16 ENCOUNTER — Other Ambulatory Visit: Payer: Self-pay

## 2020-12-16 ENCOUNTER — Other Ambulatory Visit (INDEPENDENT_AMBULATORY_CARE_PROVIDER_SITE_OTHER): Payer: Medicare Other

## 2020-12-16 DIAGNOSIS — D649 Anemia, unspecified: Secondary | ICD-10-CM

## 2020-12-17 LAB — IRON,TIBC AND FERRITIN PANEL
%SAT: 23 % (calc) (ref 16–45)
Ferritin: 95 ng/mL (ref 16–288)
Iron: 71 ug/dL (ref 45–160)
TIBC: 309 mcg/dL (calc) (ref 250–450)

## 2020-12-18 ENCOUNTER — Telehealth: Payer: Self-pay | Admitting: Internal Medicine

## 2020-12-18 ENCOUNTER — Other Ambulatory Visit: Payer: Self-pay | Admitting: Internal Medicine

## 2020-12-18 DIAGNOSIS — D539 Nutritional anemia, unspecified: Secondary | ICD-10-CM | POA: Insufficient documentation

## 2020-12-18 DIAGNOSIS — D696 Thrombocytopenia, unspecified: Secondary | ICD-10-CM

## 2020-12-18 DIAGNOSIS — D649 Anemia, unspecified: Secondary | ICD-10-CM

## 2020-12-18 NOTE — Telephone Encounter (Signed)
Spoke with pt and let her know that she does not need an iron supplement because her iron, b12 and folate levels are normal. Pt gave a verbal understanding and scheduled a lab appt for Monday.

## 2020-12-18 NOTE — Telephone Encounter (Signed)
Patient calling in about an Iron supplement. Bitron with vitamin C Iron 65 mg tablets. Patient wanting to know if this a good pill to take?   Patient also inquiring about iron labs.   Please advise

## 2020-12-18 NOTE — Assessment & Plan Note (Signed)
Iron stores, B12 and folate  are normal.  SPEP and IFE ordered  FOBT pending  Lab Results  Component Value Date   WBC 6.7 12/10/2020   HGB 10.7 (L) 12/10/2020   HCT 31.8 (L) 12/10/2020   MCV 103.5 (H) 12/10/2020   PLT 131.0 (L) 12/10/2020   Lab Results  Component Value Date   IRON 71 12/16/2020   TIBC 309 12/16/2020   FERRITIN 95 12/16/2020   Lab Results  Component Value Date   VITAMINB12 722 12/10/2020   Last vitamin B12 and Folate Lab Results  Component Value Date   VITAMINB12 722 12/10/2020   FOLATE 12.1 06/24/2020

## 2020-12-20 ENCOUNTER — Telehealth: Payer: Self-pay | Admitting: Internal Medicine

## 2020-12-20 DIAGNOSIS — I10 Essential (primary) hypertension: Secondary | ICD-10-CM | POA: Insufficient documentation

## 2020-12-20 MED ORDER — AMLODIPINE BESYLATE 2.5 MG PO TABS: 2.5000 mg | ORAL_TABLET | Freq: Every day | ORAL | 1 refills | Status: DC

## 2020-12-20 NOTE — Telephone Encounter (Signed)
Her home readings confirmed hypertension with consistent systolic elevation to Q000111Q. Advised to start  amlodipine 2.5 mg daily   Follow up 6 months

## 2020-12-20 NOTE — Assessment & Plan Note (Signed)
Her home readings confirmed hypertension with consistent systolic elevation to Q000111Q. Advised to start  amlodipine 2.5 mg daily

## 2020-12-21 ENCOUNTER — Telehealth: Payer: Self-pay | Admitting: Internal Medicine

## 2020-12-21 ENCOUNTER — Other Ambulatory Visit (INDEPENDENT_AMBULATORY_CARE_PROVIDER_SITE_OTHER): Payer: Medicare Other

## 2020-12-21 ENCOUNTER — Other Ambulatory Visit: Payer: Self-pay

## 2020-12-21 DIAGNOSIS — D696 Thrombocytopenia, unspecified: Secondary | ICD-10-CM | POA: Diagnosis not present

## 2020-12-21 DIAGNOSIS — D649 Anemia, unspecified: Secondary | ICD-10-CM

## 2020-12-21 LAB — CBC WITH DIFFERENTIAL/PLATELET
Basophils Absolute: 0.1 10*3/uL (ref 0.0–0.1)
Basophils Relative: 2.1 % (ref 0.0–3.0)
Eosinophils Absolute: 0.2 10*3/uL (ref 0.0–0.7)
Eosinophils Relative: 5.1 % — ABNORMAL HIGH (ref 0.0–5.0)
HCT: 32.9 % — ABNORMAL LOW (ref 36.0–46.0)
Hemoglobin: 11.1 g/dL — ABNORMAL LOW (ref 12.0–15.0)
Lymphocytes Relative: 33.2 % (ref 12.0–46.0)
Lymphs Abs: 1.6 10*3/uL (ref 0.7–4.0)
MCHC: 33.7 g/dL (ref 30.0–36.0)
MCV: 103.3 fl — ABNORMAL HIGH (ref 78.0–100.0)
Monocytes Absolute: 0.4 10*3/uL (ref 0.1–1.0)
Monocytes Relative: 8.1 % (ref 3.0–12.0)
Neutro Abs: 2.4 10*3/uL (ref 1.4–7.7)
Neutrophils Relative %: 51.5 % (ref 43.0–77.0)
Platelets: 127 10*3/uL — ABNORMAL LOW (ref 150.0–400.0)
RBC: 3.19 Mil/uL — ABNORMAL LOW (ref 3.87–5.11)
RDW: 14.3 % (ref 11.5–15.5)
WBC: 4.7 10*3/uL (ref 4.0–10.5)

## 2020-12-21 NOTE — Telephone Encounter (Signed)
Noted and given to pt.

## 2020-12-21 NOTE — Telephone Encounter (Signed)
Patient is coming in for labs as well as to see if she can get a print out of her most recent labs and her last appointment.Please advise.

## 2020-12-23 ENCOUNTER — Other Ambulatory Visit (INDEPENDENT_AMBULATORY_CARE_PROVIDER_SITE_OTHER): Payer: Medicare Other

## 2020-12-23 DIAGNOSIS — D649 Anemia, unspecified: Secondary | ICD-10-CM

## 2020-12-23 LAB — PROTEIN ELECTROPHORESIS, SERUM
Albumin ELP: 4 g/dL (ref 3.8–4.8)
Alpha 1: 0.3 g/dL (ref 0.2–0.3)
Alpha 2: 0.6 g/dL (ref 0.5–0.9)
Beta 2: 0.4 g/dL (ref 0.2–0.5)
Beta Globulin: 0.4 g/dL (ref 0.4–0.6)
Gamma Globulin: 0.8 g/dL (ref 0.8–1.7)
Total Protein: 6.5 g/dL (ref 6.1–8.1)

## 2020-12-23 LAB — IFE AND PE, RANDOM URINE
% BETA, Urine: 45.9 %
ALBUMIN, U: 15 %
ALPHA 1 URINE: 3.3 %
ALPHA-2-GLOBULIN, U: 9 %
GAMMA GLOBULIN URINE: 26.8 %
Protein, Ur: 10 mg/dL

## 2020-12-23 NOTE — Telephone Encounter (Signed)
Spoke with pt and advised her of the medication that she needs to start due to having elevated bp readings. Pt stated that she will pick up the rx tomorrow. Pt also asked about her most recent lab results.

## 2020-12-24 ENCOUNTER — Telehealth: Payer: Self-pay | Admitting: Internal Medicine

## 2020-12-24 ENCOUNTER — Other Ambulatory Visit: Payer: Self-pay | Admitting: Internal Medicine

## 2020-12-24 ENCOUNTER — Encounter: Payer: Self-pay | Admitting: Internal Medicine

## 2020-12-24 ENCOUNTER — Telehealth: Payer: Self-pay

## 2020-12-24 DIAGNOSIS — R195 Other fecal abnormalities: Secondary | ICD-10-CM

## 2020-12-24 DIAGNOSIS — D649 Anemia, unspecified: Secondary | ICD-10-CM

## 2020-12-24 LAB — FECAL OCCULT BLOOD, IMMUNOCHEMICAL: Fecal Occult Bld: POSITIVE — AB

## 2020-12-24 NOTE — Assessment & Plan Note (Signed)
Lab Results  Component Value Date   IRON 71 12/16/2020   TIBC 309 12/16/2020   FERRITIN 95 12/16/2020   Lab Results  Component Value Date   VITAMINB12 722 12/10/2020   Lab Results  Component Value Date   WBC 4.7 12/21/2020   HGB 11.1 (L) 12/21/2020   HCT 32.9 (L) 12/21/2020   MCV 103.3 (H) 12/21/2020   PLT 127.0 (L) 12/21/2020

## 2020-12-24 NOTE — Telephone Encounter (Signed)
CRITICAL VALUE STICKER  CRITICAL VALUE: Positive I FOB  RECEIVER (on-site recipient of call):Ashyia Schraeder  DATE & TIME NOTIFIED: 12/24/2020 0740am  MESSENGER (representative from lab):Margarita Grizzle  MD NOTIFIED: Dr Derrel Nip  TIME OF NOTIFICATION:0800am  RESPONSE:

## 2020-12-24 NOTE — Telephone Encounter (Signed)
LMTCB for lab results.  

## 2020-12-30 ENCOUNTER — Telehealth: Payer: Self-pay | Admitting: Internal Medicine

## 2020-12-30 NOTE — Telephone Encounter (Signed)
Does pt need to be seen sooner than November 1st? She was referred for low hemoglobin and positive fecal occult blood test.

## 2020-12-30 NOTE — Telephone Encounter (Signed)
Patient received a call from her GI specialist stating that their first available appointment is 02/23/21,she would like to know if Dr.Tullo can call their office to see if they can get her a sooner appointment.Please advise.

## 2020-12-31 NOTE — Telephone Encounter (Signed)
Pt is aware.  

## 2021-01-13 ENCOUNTER — Telehealth (INDEPENDENT_AMBULATORY_CARE_PROVIDER_SITE_OTHER): Payer: Medicare Other | Admitting: Internal Medicine

## 2021-01-13 DIAGNOSIS — I10 Essential (primary) hypertension: Secondary | ICD-10-CM

## 2021-01-13 DIAGNOSIS — R195 Other fecal abnormalities: Secondary | ICD-10-CM

## 2021-01-13 DIAGNOSIS — D696 Thrombocytopenia, unspecified: Secondary | ICD-10-CM | POA: Diagnosis not present

## 2021-01-13 DIAGNOSIS — D649 Anemia, unspecified: Secondary | ICD-10-CM

## 2021-01-13 DIAGNOSIS — F5101 Primary insomnia: Secondary | ICD-10-CM | POA: Diagnosis not present

## 2021-01-13 DIAGNOSIS — R7303 Prediabetes: Secondary | ICD-10-CM | POA: Diagnosis not present

## 2021-01-13 NOTE — Progress Notes (Signed)
Telephone  Note  This visit type was conducted due to national recommendations for restrictions regarding the COVID-19 pandemic (e.g. social distancing).  This format is felt to be most appropriate for this patient at this time.  All issues noted in this document were discussed and addressed.  No physical exam was performed (except for noted visual exam findings with Video Visits).   I connected withNAME@ on 01/13/21 at  4:30 PM EDT by  telephone and verified that I am speaking with the correct person using two identifiers. Location patient: home Location provider: work or home office Persons participating in the virtual visit: patient, provider  I discussed the limitations, risks, security and privacy concerns of performing an evaluation and management service by telephone and the availability of in person appointments. I also discussed with the patient that there may be a patient responsible charge related to this service. The patient expressed understanding and agreed to proceed.  Reason for visit: follow up on recent labs , Blood pressure   HPI  1) thrombocytopenia :  2 nosebleeds.  Discussed hematology referral  2) Positive cologuard :  GI referral discussed and in progress   3) anemia:  iron stores normal . GI referral/heme referral  4) Hypertension:  tolerating initiation of  2.5 amlodipine BP fluctuation based on home readings with systolic readings  ranging from a high of 150 to a low of 119  5) Insomnia:  taking 25 mg trazodone and sleeping well    Review of Systems:  Patient denies headache, fevers, malaise, unintentional weight loss, skin rash, eye pain, sinus congestion and sinus pain, sore throat, dysphagia,  hemoptysis , cough, dyspnea, wheezing, chest pain, palpitations, orthopnea, edema, abdominal pain, nausea, melena, diarrhea, constipation, flank pain, dysuria, hematuria, urinary  Frequency, nocturia, numbness, tingling, seizures,  Focal weakness, Loss of consciousness,   Tremor, insomnia, depression, anxiety, and suicidal ideation.     Objective:  BP 119/65   Pulse 78    General impression: alert, cooperative and articulate.  No signs of being in distress  Lungs: speech is fluent sentence length suggests that patient is not short of breath and not punctuated by cough, sneezing or sniffing. Marland Kitchen   Psych: affect normal.  speech is articulate and non pressured .  Denies suicidal thoughts     Assessment & Plan:   Problem List Items Addressed This Visit       Unprioritized   Anemia    Macrocytic,  With normal iron, folate  and b12 studies.   Now with thrombocytenia as well.   SPEP noted a faintly positive band of restricted protein,  But urine IFE was normal.  GFR is normal as well.  Hematology referral recommended and in progress  Lab Results  Component Value Date   IRON 71 12/16/2020   TIBC 309 12/16/2020   FERRITIN 95 12/16/2020   Lab Results  Component Value Date   [id number] 096 12/10/2020    Lab Results  Component Value Date   FOLATE 12.1 06/24/2020   Lab Results  Component Value Date   CREATININE 0.83 12/10/2020         Essential hypertension    Her home readings have improved with addition of amlodipine 2.5 mg daily and she has reported no side effects .  No changes today          Insomnia    Trial of trazodone starting at 25 mg qhs has been tolerated and noted to improve her sleep latency and efficiency,.  No  changes today       Positive fecal occult blood test    With macrocytic anemia noted on labs.  For colonoscopy  Lab Results  Component Value Date   IRON 71 12/16/2020   TIBC 309 12/16/2020   FERRITIN 95 12/16/2020   Lab Results  Component Value Date   WBC 4.7 12/21/2020   HGB 11.1 (L) 12/21/2020   HCT 32.9 (L) 12/21/2020   MCV 103.3 (H) 12/21/2020   PLT 127.0 (L) 12/21/2020         Prediabetes    a1c has increased to 6.4  Fasting glucose non diagnostic.  Elevated readings in 2020 during  hospitalization for COVID pneumonia.      Thrombocytopenia (HCC)    Mild,  With recent episodes of epistaxis x 2.  Given concurrrent anemia, recommending evalUATION  by Dr RAO      No orders of the defined types were placed in this encounter.   There are no discontinued medications.  Follow-up: No follow-ups on file.   Crecencio Mc, MD   1) two episodes of e   ROS: See pertinent positives and negatives per HPI.  Past Medical History:  Diagnosis Date   Arthritis    Asthma    Depression    Dyspnea    with heat   Environmental and seasonal allergies    GERD (gastroesophageal reflux disease)    Presence of dental prosthetic device    Dental implants   Wheezing     Past Surgical History:  Procedure Laterality Date   ABDOMINAL HYSTERECTOMY  1989   BROW LIFT Bilateral 02/07/2020   Procedure: BLEPHAROPLASTY UPPER EYELID; W/EXCESS SKIN BROW PTOSIS REPAIR BILATERAL;  Surgeon: Karle Starch, MD;  Location: [facility];  Service: Ophthalmology;  Laterality: Bilateral;   CATARACT EXTRACTION W/PHACO Right 09/13/2016   Procedure: CATARACT EXTRACTION PHACO AND INTRAOCULAR LENS PLACEMENT (IOC);  Surgeon: Birder Robson, MD;  Location: [facility];  Service: Ophthalmology;  Laterality: Right;  [place] 00:38 AP% 14.3 CDE 5.51 Fluid pack lot # [id number]   CATARACT EXTRACTION W/PHACO Left 10/11/2016   Procedure: CATARACT EXTRACTION PHACO AND INTRAOCULAR LENS PLACEMENT (IOC);  Surgeon: Birder Robson, MD;  Location: [facility];  Service: Ophthalmology;  Laterality: Left;  [place] 00:30 AP% 11.3 CDE 3.50 fluid pack lot # [id number]   COLONOSCOPY WITH PROPOFOL N/A 09/30/2014   Procedure: COLONOSCOPY WITH PROPOFOL;  Surgeon: Robert Bellow, MD;  Location: [facility];  Service: Endoscopy;  Laterality: N/A;   ESOPHAGOGASTRODUODENOSCOPY N/A 09/30/2014   Procedure: ESOPHAGOGASTRODUODENOSCOPY (EGD);  Surgeon: Robert Bellow, MD;  Location: [facility] ENDOSCOPY;  Service: Endoscopy;  Laterality:  N/A;   FOOT SURGERY Left 1985   KNEE SURGERY  2016    Family History  Problem Relation Age of Onset   Diabetes Mother    Hyperlipidemia Mother    Heart disease Mother     SOCIAL HX:   reports that she has never smoked. She has never used smokeless tobacco. She reports that she does not drink alcohol and does not use drugs.    Current Outpatient Medications:    acetaminophen (TYLENOL) 325 MG tablet, Take 2 tablets (650 mg total) by mouth 3 (three) times daily as needed., Disp: 90 tablet, Rfl: 3   acetaminophen (TYLENOL) 500 MG tablet, Take 500 mg by mouth every 6 (six) hours as needed., Disp: , Rfl:    albuterol (VENTOLIN HFA) 108 (90 Base) MCG/ACT inhaler, Inhale 2 puffs into the lungs every 6 (six) hours as needed  for wheezing or shortness of breath., Disp: 1 each, Rfl: 11   amLODipine (NORVASC) 2.5 MG tablet, Take 1 tablet (2.5 mg total) by mouth daily., Disp: 90 tablet, Rfl: 1   budesonide (PULMICORT) 0.25 MG/2ML nebulizer solution, Take 2 mLs (0.25 mg total) by nebulization 2 (two) times daily., Disp: 60 mL, Rfl: 12   Calcium Citrate-Vitamin D (CALCIUM CITRATE + D PO), Take 1 tablet by mouth 2 (two) times daily., Disp: , Rfl:    citalopram (CELEXA) 20 MG tablet, Take 1 tablet (20 mg total) by mouth daily., Disp: 90 tablet, Rfl: 1   famotidine (PEPCID) 20 MG tablet, TAKE 1 TABLET BY MOUTH DAILY. BEFORE DINNER (Patient taking differently: Take 20 mg by mouth daily.), Disp: 30 tablet, Rfl: 11   fexofenadine (ALLEGRA) 180 MG tablet, Take 180 mg by mouth daily., Disp: , Rfl:    Fluticasone-Umeclidin-Vilant (TRELEGY ELLIPTA) 100-62.5-25 MCG/INH AEPB, Inhale 1 puff into the lungs daily. 90 day supply, Disp: 60 each, Rfl: 3   Fluticasone-Umeclidin-Vilant (TRELEGY ELLIPTA) 100-62.5-25 MCG/INH AEPB, Inhale 100 mcg into the lungs daily., Disp: 28 each, Rfl: 0   gabapentin (NEURONTIN) 100 MG capsule, Take 1 capsule (100 mg total) by mouth 3 (three) times daily., Disp: 90 capsule, Rfl: 3    ipratropium-albuterol (DUONEB) 0.5-2.5 (3) MG/3ML SOLN, Take 3 mLs by nebulization every 6 (six) hours as needed., Disp: 360 mL, Rfl: 2   levothyroxine (SYNTHROID) 50 MCG tablet, TAKE 1 TABLET EVERY DAY ON EMPTY STOMACHWITH A GLASS OF WATER AT LEAST 30-60 MINBEFORE BREAKFAST, Disp: 90 tablet, Rfl: 1   pantoprazole (PROTONIX) 40 MG tablet, TAKE ONE TABLET BY MOUTH EVERY MORNING 30 MIN BEFORE BREAKFAST, Disp: 90 tablet, Rfl: 3   sodium chloride (OCEAN) 0.65 % SOLN nasal spray, Place 2 sprays into both nostrils as needed for congestion., Disp: 30 mL, Rfl: 2   traZODone (DESYREL) 50 MG tablet, Take 0.5-1 tablets (25-50 mg total) by mouth at bedtime as needed for sleep., Disp: 30 tablet, Rfl: 3   vitamin B-12 (CYANOCOBALAMIN) 1000 MCG tablet, Take 1,000 mcg by mouth daily., Disp: , Rfl:    Benzocaine-Menthol (CEPACOL SORE THROAT) 15-3.6 MG LOZG, Use as directed 1 lozenge in the mouth or throat 2 (two) times daily as needed. (Patient not taking: Reported on 01/13/2021), Disp: 50 lozenge, Rfl: 0  EXAM:  VITALS per patient if applicable:  GENERAL: alert, oriented, appears well and in no acute distress  HEENT: atraumatic, conjunttiva clear, no obvious abnormalities on inspection of external nose and ears  NECK: normal movements of the head and neck  LUNGS: on inspection no signs of respiratory distress, breathing rate appears normal, no obvious gross SOB, gasping or wheezing  CV: no obvious cyanosis  MS: moves all visible extremities without noticeable abnormality  PSYCH/NEURO: pleasant and cooperative, no obvious depression or anxiety, speech and thought processing grossly intact  ASSESSMENT AND PLAN:  Discussed the following assessment and plan:  Prediabetes  Thrombocytopenia (HCC)  Essential hypertension  Primary insomnia  Positive fecal occult blood test  Anemia, unspecified type  Prediabetes a1c has increased to 6.4  Fasting glucose non diagnostic.  Elevated readings in 2020  during hospitalization for COVID pneumonia.  Thrombocytopenia (HCC) Mild,  With recent episodes of epistaxis x 2.  Given concurrrent anemia, recommending evalUATION  by Dr RAO  Essential hypertension Her home readings have improved with addition of amlodipine 2.5 mg daily and she has reported no side effects .  No changes today      Insomnia Trial  of trazodone starting at 25 mg qhs has been tolerated and noted to improve her sleep latency and efficiency,.  No changes today   Positive fecal occult blood test With macrocytic anemia noted on labs.  For colonoscopy  Lab Results  Component Value Date   IRON 71 12/16/2020   TIBC 309 12/16/2020   FERRITIN 95 12/16/2020   Lab Results  Component Value Date   WBC 4.7 12/21/2020   HGB 11.1 (L) 12/21/2020   HCT 32.9 (L) 12/21/2020   MCV 103.3 (H) 12/21/2020   PLT 127.0 (L) 12/21/2020     Anemia Macrocytic,  With normal iron, folate  and b12 studies.   Now with thrombocytenia as well.   SPEP noted a faintly positive band of restricted protein,  But urine IFE was normal.  GFR is normal as well.  Hematology referral recommended and in progress  Lab Results  Component Value Date   IRON 71 12/16/2020   TIBC 309 12/16/2020   FERRITIN 95 12/16/2020   Lab Results  Component Value Date   [id number] 784 12/10/2020    Lab Results  Component Value Date   FOLATE 12.1 06/24/2020   Lab Results  Component Value Date   CREATININE 0.83 12/10/2020     I discussed the assessment and treatment plan with the patient. The patient was provided an opportunity to ask questions and all were answered. The patient agreed with the plan and demonstrated an understanding of the instructions.   The patient was advised to call back or seek an in-person evaluation if the symptoms worsen or if the condition fails to improve as anticipated.   I spent 30 minutes dedicated to the care of this patient on the date of this encounter to include pre-visit  review of her  medical history,  recent lab studies,  non Face-to-face time with the patient , and post visit ordering of testing and therapeutics.    Crecencio Mc, MD

## 2021-01-13 NOTE — Assessment & Plan Note (Addendum)
Mild,  With recent episodes of epistaxis x 2.  Given concurrrent anemia, recommending evalUATION  by Dr RAO

## 2021-01-13 NOTE — Assessment & Plan Note (Signed)
a1c has increased to 6.4  Fasting glucose non diagnostic.  Elevated readings in 2020 during hospitalization for COVID pneumonia.

## 2021-01-15 ENCOUNTER — Telehealth: Payer: Self-pay | Admitting: Internal Medicine

## 2021-01-15 NOTE — Telephone Encounter (Signed)
Patient dropped off her blood pressure readings.They are upfront in Dr.Tullo's color folder.

## 2021-01-16 NOTE — Assessment & Plan Note (Signed)
With macrocytic anemia noted on labs.  For colonoscopy  Lab Results  Component Value Date   IRON 71 12/16/2020   TIBC 309 12/16/2020   FERRITIN 95 12/16/2020   Lab Results  Component Value Date   WBC 4.7 12/21/2020   HGB 11.1 (L) 12/21/2020   HCT 32.9 (L) 12/21/2020   MCV 103.3 (H) 12/21/2020   PLT 127.0 (L) 12/21/2020

## 2021-01-16 NOTE — Assessment & Plan Note (Signed)
Her home readings have improved with addition of amlodipine 2.5 mg daily and she has reported no side effects .  No changes today

## 2021-01-16 NOTE — Assessment & Plan Note (Addendum)
Macrocytic,  With normal iron, folate  and b12 studies.   Now with thrombocytenia as well.   SPEP noted a faintly positive band of restricted protein,  But urine IFE was normal.  GFR is normal as well.  Hematology referral recommended and in progress  Lab Results  Component Value Date   IRON 71 12/16/2020   TIBC 309 12/16/2020   FERRITIN 95 12/16/2020   Lab Results  Component Value Date   [id number] 148 12/10/2020    Lab Results  Component Value Date   FOLATE 12.1 06/24/2020   Lab Results  Component Value Date   CREATININE 0.83 12/10/2020

## 2021-01-16 NOTE — Assessment & Plan Note (Signed)
Trial of trazodone starting at 25 mg qhs has been tolerated and noted to improve her sleep latency and efficiency,.  No changes today

## 2021-01-18 NOTE — Telephone Encounter (Signed)
Placed in results folder.  

## 2021-01-19 ENCOUNTER — Ambulatory Visit (INDEPENDENT_AMBULATORY_CARE_PROVIDER_SITE_OTHER): Payer: Medicare Other | Admitting: Pharmacist

## 2021-01-19 DIAGNOSIS — I10 Essential (primary) hypertension: Secondary | ICD-10-CM

## 2021-01-19 DIAGNOSIS — J4540 Moderate persistent asthma, uncomplicated: Secondary | ICD-10-CM

## 2021-01-19 DIAGNOSIS — R7303 Prediabetes: Secondary | ICD-10-CM

## 2021-01-19 DIAGNOSIS — E785 Hyperlipidemia, unspecified: Secondary | ICD-10-CM

## 2021-01-19 MED ORDER — AMLODIPINE BESYLATE 5 MG PO TABS: 5.0000 mg | ORAL_TABLET | Freq: Every day | ORAL | 1 refills | Status: DC

## 2021-01-19 MED ORDER — TRELEGY ELLIPTA 100-62.5-25 MCG/INH IN AEPB: 1.0000 | INHALATION_SPRAY | Freq: Every day | RESPIRATORY_TRACT | 3 refills | Status: DC

## 2021-01-19 MED ORDER — TRELEGY ELLIPTA 100-62.5-25 MCG/INH IN AEPB: 1.0000 | INHALATION_SPRAY | Freq: Every day | RESPIRATORY_TRACT | 1 refills | Status: DC

## 2021-01-19 NOTE — Patient Instructions (Signed)
Visit Information  PATIENT GOALS:  Goals Addressed               This Visit's Progress     Patient Stated     Medication Monitoring (pt-stated)        Patient Goals/Self-Care Activities Over the next 90 days, patient will:  - take medications as prescribed collaborate with provider on medication access solutions          Patient verbalizes understanding of instructions provided today and agrees to view in [place].   Plan: Telephone follow up appointment with care management team member scheduled for:  ~6 weeks  [name], PharmD, [place], [place] Clinical Pharmacist [employer] [facility] [employer] [phone]

## 2021-01-19 NOTE — Telephone Encounter (Signed)
Readings are mostly high.  Please ask her to increase amlodipine to 5 mg daily and update chart. Please also schedule her a RN visit for BP check and home machine calibration

## 2021-01-19 NOTE — Telephone Encounter (Signed)
Was on the phone with patient for a visit, so took care of this.   Amlodipine 5 mg script sent. Patient instructed that she can complete supply of 2.5 mg tablets by taking 2 tablets daily until finished, then she can start taking one 5 mg tablet daily.   Scheduled for nurse visit in 2 weeks for BP check and BP machine comparison.

## 2021-01-19 NOTE — Chronic Care Management (AMB) (Signed)
Chronic Care Management Pharmacy Note  01/19/2021 Name:  Sarah [name] MRN:  [id number] DOB:  1951-03-11  Subjective: Sarah [name] is an 70 y.o. year old female who is a primary patient of Derrel Nip, Aris Everts, MD.  The CCM team was consulted for assistance with disease management and care coordination needs.    Engaged with patient by telephone for follow up visit in response to provider referral for pharmacy case management and/or care coordination services.   Consent to Services:  The patient was given information about Chronic Care Management services, agreed to services, and gave verbal consent prior to initiation of services.  Please see initial visit note for detailed documentation.   Patient Care Team: Crecencio Mc, MD as PCP - General (Internal Medicine) Crecencio Mc, MD (Internal Medicine) Robert Bellow, MD (General Surgery) De Hollingshead, RPH-CPP (Pharmacist)   Objective:  Lab Results  Component Value Date   CREATININE 0.83 12/10/2020   CREATININE 0.94 06/12/2020   CREATININE 0.88 04/08/2019    Lab Results  Component Value Date   HGBA1C 6.4 12/10/2020   Last diabetic Eye exam: No results found for: HMDIABEYEEXA  Last diabetic Foot exam: No results found for: HMDIABFOOTEX      Component Value Date/Time   CHOL 221 (H) 06/12/2020 0836   TRIG 63.0 06/12/2020 0836   HDL 65.50 06/12/2020 0836   CHOLHDL 3 06/12/2020 0836   VLDL 12.6 06/12/2020 0836   LDLCALC 143 (H) 06/12/2020 0836   LDLCALC 142 (H) 04/06/2018 1520    Hepatic Function Latest Ref Rng & Units 12/21/2020 12/10/2020 06/12/2020  Total Protein 6.1 - 8.1 g/dL 6.5 6.3 6.5  Albumin 3.5 - 5.2 g/dL - 3.7 4.1  AST 0 - 37 U/L - 15 11  ALT 0 - 35 U/L - 18 13  Alk Phosphatase 39 - 117 U/L - 68 66  Total Bilirubin 0.2 - 1.2 mg/dL - 0.5 0.3    Lab Results  Component Value Date/Time   TSH 2.08 12/10/2020 09:12 AM   TSH 2.98 06/12/2020 08:36 AM    CBC Latest Ref Rng & Units 12/21/2020  12/10/2020 06/12/2020  WBC 4.0 - 10.5 K/uL 4.7 6.7 5.8  Hemoglobin 12.0 - 15.0 g/dL 11.1(L) 10.7(L) 11.9(L)  Hematocrit 36.0 - 46.0 % 32.9(L) 31.8(L) 35.1(L)  Platelets 150.0 - 400.0 K/uL 127.0(L) 131.0(L) 145.0(L)    Lab Results  Component Value Date/Time   VD25OH 32.17 07/30/2015 01:27 PM     Social History   Tobacco Use  Smoking Status Never  Smokeless Tobacco Never   BP Readings from Last 3 Encounters:  01/13/21 119/65  12/10/20 140/74  08/06/20 122/70   Pulse Readings from Last 3 Encounters:  01/13/21 78  12/10/20 90  08/06/20 75   Wt Readings from Last 3 Encounters:  12/10/20 185 lb 6.4 oz (84.1 kg)  11/26/20 171 lb (77.6 kg)  08/06/20 176 lb 9.6 oz (80.1 kg)    Assessment: Review of patient past medical history, allergies, medications, health status, including review of consultants reports, laboratory and other test data, was performed as part of comprehensive evaluation and provision of chronic care management services.   SDOH:  (Social Determinants of Health) assessments and interventions performed:    CCM Care Plan  No Known Allergies  Medications Reviewed Today     Reviewed by Adair Laundry, [place] (Certified Medical Assistant) on 01/13/21 at 1638  Med List Status: <None>   Medication Order Taking? Sig Documenting Provider Last Dose Status  Informant  acetaminophen (TYLENOL) 325 MG tablet [id number] Yes Take 2 tablets (650 mg total) by mouth 3 (three) times daily as needed. McLean-Scocuzza, Nino Glow, MD Taking Active   acetaminophen (TYLENOL) 500 MG tablet [id number] Yes Take 500 mg by mouth every 6 (six) hours as needed. [provider] Taking Active   albuterol (VENTOLIN HFA) 108 (90 Base) MCG/ACT inhaler [id number] Yes Inhale 2 puffs into the lungs every 6 (six) hours as needed for wheezing or shortness of breath. McLean-Scocuzza, Nino Glow, MD Taking Active   amLODipine (NORVASC) 2.5 MG tablet [id number] Yes Take 1 tablet (2.5 mg total) by mouth  daily. Crecencio Mc, MD Taking Active   Benzocaine-Menthol (CEPACOL SORE THROAT) 15-3.6 MG LOZG [id number] No Use as directed 1 lozenge in the mouth or throat 2 (two) times daily as needed.  Patient not taking: Reported on 01/13/2021   McLean-Scocuzza, Nino Glow, MD Not Taking Active   budesonide (PULMICORT) 0.25 MG/2ML nebulizer solution [id number] Yes Take 2 mLs (0.25 mg total) by nebulization 2 (two) times daily. McLean-Scocuzza, Nino Glow, MD Taking Active   Calcium Citrate-Vitamin D (CALCIUM CITRATE + D PO) [id number] Yes Take 1 tablet by mouth 2 (two) times daily. [provider] Taking Active   citalopram (CELEXA) 20 MG tablet [id number] Yes Take 1 tablet (20 mg total) by mouth daily. Crecencio Mc, MD Taking Active   famotidine (PEPCID) 20 MG tablet [id number] Yes TAKE 1 TABLET BY MOUTH DAILY. BEFORE DINNER  Patient taking differently: Take 20 mg by mouth daily.   Crecencio Mc, MD Taking Active   fexofenadine [facility]) 180 MG tablet [id number] Yes Take 180 mg by mouth daily. [provider] Taking Active Self  Fluticasone-Umeclidin-Vilant (TRELEGY ELLIPTA) 100-62.5-25 MCG/INH AEPB [id number] Yes Inhale 1 puff into the lungs daily. 90 day supply Crecencio Mc, MD Taking Active   Fluticasone-Umeclidin-Vilant [facility] ELLIPTA) 100-62.5-25 MCG/INH AEPB [id number] Yes Inhale 100 mcg into the lungs daily. Tyler Pita, MD Taking Active   gabapentin (NEURONTIN) 100 MG capsule [id number] Yes Take 1 capsule (100 mg total) by mouth 3 (three) times daily. Crecencio Mc, MD Taking Active            Med Note De Hollingshead   Wed Oct 07, 2020 10:37 AM) Taking QPM  ipratropium-albuterol (DUONEB) 0.5-2.5 (3) MG/3ML SOLN [id number] Yes Take 3 mLs by nebulization every 6 (six) hours as needed. McLean-Scocuzza, Nino Glow, MD Taking Active   levothyroxine (SYNTHROID) 50 MCG tablet [id number] Yes TAKE 1 TABLET EVERY DAY ON EMPTY STOMACHWITH A GLASS OF WATER AT LEAST 30-60 MINBEFORE  BREAKFAST Crecencio Mc, MD Taking Active   pantoprazole (PROTONIX) 40 MG tablet [id number] Yes TAKE ONE TABLET BY MOUTH EVERY MORNING 30 MIN BEFORE BREAKFAST Crecencio Mc, MD Taking Active   sodium chloride (OCEAN) 0.65 % SOLN nasal spray [id number] Yes Place 2 sprays into both nostrils as needed for congestion. McLean-Scocuzza, Nino Glow, MD Taking Active   traZODone (DESYREL) 50 MG tablet [id number] Yes Take 0.5-1 tablets (25-50 mg total) by mouth at bedtime as needed for sleep. Crecencio Mc, MD Taking Active   vitamin B-12 (CYANOCOBALAMIN) 1000 MCG tablet [id number] Yes Take 1,000 mcg by mouth daily. [provider] Taking Active             Patient Active Problem List   Diagnosis Date Noted   Thrombocytopenia ([place]) 01/13/2021   Positive fecal occult blood test 12/24/2020   Essential hypertension 12/20/2020   Anemia 12/18/2020  Allergic rhinitis 12/13/2020   Fatigue 12/13/2020   Insomnia 12/13/2020   Grief reaction 08/09/2020   Cognitive complaints 08/09/2020   Neuropathy 06/14/2020   Educated about COVID-19 virus infection 04/10/2019   Prediabetes 10/13/2018   LVH (left ventricular hypertrophy) due to hypertensive disease, without heart failure 10/13/2018   Chest tightness 06/26/2018   Asthma 06/04/2018   Hypothyroid 06/15/2017   Post herpetic neuralgia 06/06/2017   Encounter for preventive health examination 09/09/2014   Esophageal reflux 09/09/2014   Hyperlipidemia 08/17/2014   S/P hysterectomy 04/28/2013   Initial Medicare annual wellness visit 04/28/2013   Depression     Immunization History  Administered Date(s) Administered   Fluad Quad(high Dose 65+) 12/06/2018   Influenza, High Dose Seasonal PF 04/22/2016, 06/05/2017   Influenza,inj,Quad PF,6+ Mos 04/26/2013, 01/30/2015   Influenza-Unspecified 01/24/2018, 12/06/2018, 01/02/2020   Moderna Sars-Covid-2 Vaccination 06/20/2019, 07/22/2019, 02/14/2020   PNEUMOCOCCAL CONJUGATE-20 11/09/2020    Pneumococcal Conjugate-13 09/01/2014, 01/24/2018   Pneumococcal Polysaccharide-23 04/26/2013, 02/28/2019   Tdap 06/18/2014   Zoster Recombinat (Shingrix) 07/08/2017, 09/22/2017    Conditions to be addressed/monitored: HTN and Pulmonary Disease  Care Plan : Medication Management  Updates made by De Hollingshead, RPH-CPP since 01/19/2021 12:00 AM     Problem: Asthma, Neuropathy, HTN      Long-Range Goal: Disease Progression Prevention   Start Date: 07/15/2020  This Visit's Progress: On track  Recent Progress: On track  Priority: High  Note:   Current Barriers:  Unable to independently afford treatment regimen  Pharmacist Clinical Goal(s):  Over the next 90 days, patient will verbalize ability to afford treatment regimen Over the next 90 days, patient will achieve adherence to monitoring guidelines and medication adherence to achieve therapeutic efficacy through collaboration with PharmD and provider.   Interventions: 1:1 collaboration with Crecencio Mc, MD regarding development and update of comprehensive plan of care as evidenced by provider attestation and co-signature Inter-disciplinary care team collaboration (see longitudinal plan of care) Comprehensive medication review performed; medication list updated in electronic medical record  Hypertension: Uncontrolled; current treatment: amlodipine 2.5 mg daily Current home readings: 130-140s/70-80s   Increase amlodipine to 5 mg daily per PCP. Scheduled for nurse visit for BP check and home BP machine comparison  Hyperlipidemia: Uncontrolled; current treatment: none  Medications previously tried: none 10 year ASCVD risk score: 88% Uncertain benefit vs risk of statin therapy given age. Consider cardiac CT scoring to further elucidate potential benefit for statin  Asthma: Controlled per patient report; current treatment: Trelegy 100/25/62.5 mcg 1 puff daily; albuterol HFA PRN Reports significant benefit of Trelegy on  breathing. Is in Medicare Coverage Gap. PFT 07/15/20: Mild restrictive lung disease without significant broncho-dilator response Reviewed income and out of pocket spend. If patient fills Trelegy 1 more time, she would qualify for Trelegy assistance from [employer]. She will fill this today and collect a new print out of total out of pocket spend and bring to clinic to me. Will collaborate w/ CphT and PCP to submit patient assistance application for Trelegy.  Depression Controlled per patient report; current treatment: citalopram 20 mg daily Previously recommended to continue current regimen at this time  GERD: Controlled per patient report; pantoprazole 40 mg QAM, famotidine 20 mg QPM  Patient derives significant benefit from dual PPI and H2RA at this time. Notes when she forgets one of these two Previously recommended to continue current regimen at this time, avoid trigger foods.   Osteopenia: Current treatment: calcium citrate 600 mg + vitamin D 2  tablets daily. No regular dairy sources Last DEXA 09/2014: t score -2.2 at spine, -2.0 at femur; FRAX risk of major osteoporotic fracture 17.2%, hip fracture 2.7% Previously recommended to continue current regimen at this time. Moving forward, consider updated DEXA, especially given need for periodic steroid use for asthma.  Neuropathy/Osteoarthritis Pain: Improved per patient report; current regimen: gabapentin 100 mg QPM, taking acetaminophen 500 mg PRN (has switched away from ibuprofen) Previously recommended to continue current regimen at this time   Hypothyroidism: Controlled per last lab work, current regimen: levothyroxine 50 mcg daily Previously recommended to continue current regimen at this time  Supplements: Vitamin B12 1000 mcg daily, calcium citrate 600 mg + vitamin D as above   Patient Goals/Self-Care Activities Over the next 90 days, patient will:  - take medications as prescribed collaborate with provider on  medication access solutions  Follow Up Plan: Telephone follow up appointment with care management team member scheduled for: ~6 weeks      Medication Assistance: Application for Trelegy  medication assistance program. in process.  Anticipated assistance start date TBD.  See plan of care for additional detail.  Patient's preferred pharmacy is:  [facility] - [place], [place] - [address] [place] [address] Phone: [phone] Fax: [phone]   Follow Up:  Patient agrees to Care Plan and Follow-up.  Plan: Telephone follow up appointment with care management team member scheduled for:  ~6 weeks  [name], PharmD, [place], [place] Clinical Pharmacist [employer] [facility] [employer] [phone]

## 2021-01-22 DIAGNOSIS — I10 Essential (primary) hypertension: Secondary | ICD-10-CM

## 2021-01-22 DIAGNOSIS — E785 Hyperlipidemia, unspecified: Secondary | ICD-10-CM

## 2021-01-22 DIAGNOSIS — J4540 Moderate persistent asthma, uncomplicated: Secondary | ICD-10-CM | POA: Diagnosis not present

## 2021-01-25 ENCOUNTER — Telehealth: Payer: Self-pay | Admitting: Internal Medicine

## 2021-01-25 NOTE — Telephone Encounter (Signed)
Called patient back. She picked up copy of out of pocket spend from the pharmacy. She will bring this by the office later today. Copy of [place] patient assistance application is at the front desk for her to complete highlighted portions. Once highlighted portions are completed, she can leave the out of pocket spend report and completed application in my folder at the front.

## 2021-01-25 NOTE — Telephone Encounter (Signed)
Patient is calling in to see if she will need an appointment to discuss a form she has for her medications,she stated it is with the pharmacist.Please call the patient to check on the status of this.

## 2021-01-25 NOTE — Telephone Encounter (Signed)
Patient also dropped off BP readings for review. Amlodipine was increased 9/27. She has a nurse visit on 10/12 and plans to bring her home wrist cuff with her to this appointment for comparison.

## 2021-01-26 ENCOUNTER — Telehealth: Payer: Self-pay | Admitting: Pharmacy Technician

## 2021-01-26 ENCOUNTER — Telehealth: Payer: Self-pay | Admitting: Internal Medicine

## 2021-01-26 DIAGNOSIS — Z596 Low income: Secondary | ICD-10-CM

## 2021-01-26 NOTE — Telephone Encounter (Signed)
BP readings received,   continue 5 mg amlodipine and keep RN visit to check accuracy of home BP machine on oct 12

## 2021-01-26 NOTE — Progress Notes (Signed)
[place] [facility])                                            [name]    01/26/2021  Sarah [name] February 12, 1951 [id number]                                      Medication Assistance Referral  Referral From: [facility] Embedded RPh [name]   Medication/Company: Trelegy / [place] Patient application portion:  N/A Embedded PharmD had patient sign in clinic Provider application portion:  N/A Embedded PharmD had signed in clinic  to Dr. Deborra Medina Provider address/fax verified via: Office website  Received both patient and provider portion(s) of patient assistance application(s) for Trelegy. Faxed completed application and required documents into [employer].   [name] P. [name], [place]  [phone]

## 2021-01-27 ENCOUNTER — Other Ambulatory Visit: Payer: Self-pay | Admitting: Internal Medicine

## 2021-01-27 ENCOUNTER — Inpatient Hospital Stay: Payer: Medicare Other | Attending: Internal Medicine | Admitting: Internal Medicine

## 2021-01-27 ENCOUNTER — Inpatient Hospital Stay: Payer: Medicare Other

## 2021-01-27 ENCOUNTER — Encounter: Payer: Self-pay | Admitting: Internal Medicine

## 2021-01-27 VITALS — BP 148/66 | HR 75 | Temp 98.2°F | Resp 18 | Wt 182.0 lb

## 2021-01-27 DIAGNOSIS — R160 Hepatomegaly, not elsewhere classified: Secondary | ICD-10-CM | POA: Insufficient documentation

## 2021-01-27 DIAGNOSIS — F32A Depression, unspecified: Secondary | ICD-10-CM | POA: Diagnosis not present

## 2021-01-27 DIAGNOSIS — Z8349 Family history of other endocrine, nutritional and metabolic diseases: Secondary | ICD-10-CM | POA: Insufficient documentation

## 2021-01-27 DIAGNOSIS — N1330 Unspecified hydronephrosis: Secondary | ICD-10-CM | POA: Insufficient documentation

## 2021-01-27 DIAGNOSIS — Z8249 Family history of ischemic heart disease and other diseases of the circulatory system: Secondary | ICD-10-CM | POA: Insufficient documentation

## 2021-01-27 DIAGNOSIS — D696 Thrombocytopenia, unspecified: Secondary | ICD-10-CM | POA: Insufficient documentation

## 2021-01-27 DIAGNOSIS — D539 Nutritional anemia, unspecified: Secondary | ICD-10-CM | POA: Diagnosis not present

## 2021-01-27 DIAGNOSIS — R5383 Other fatigue: Secondary | ICD-10-CM | POA: Diagnosis not present

## 2021-01-27 DIAGNOSIS — Z833 Family history of diabetes mellitus: Secondary | ICD-10-CM | POA: Insufficient documentation

## 2021-01-27 DIAGNOSIS — Z7951 Long term (current) use of inhaled steroids: Secondary | ICD-10-CM | POA: Insufficient documentation

## 2021-01-27 DIAGNOSIS — K219 Gastro-esophageal reflux disease without esophagitis: Secondary | ICD-10-CM | POA: Insufficient documentation

## 2021-01-27 DIAGNOSIS — M2550 Pain in unspecified joint: Secondary | ICD-10-CM | POA: Insufficient documentation

## 2021-01-27 LAB — CBC WITH DIFFERENTIAL/PLATELET
Abs Immature Granulocytes: 0.06 10*3/uL (ref 0.00–0.07)
Basophils Absolute: 0.1 10*3/uL (ref 0.0–0.1)
Basophils Relative: 1 %
Eosinophils Absolute: 0.2 10*3/uL (ref 0.0–0.5)
Eosinophils Relative: 4 %
HCT: 36.5 % (ref 36.0–46.0)
Hemoglobin: 12.1 g/dL (ref 12.0–15.0)
Immature Granulocytes: 1 %
Lymphocytes Relative: 25 %
Lymphs Abs: 1.4 10*3/uL (ref 0.7–4.0)
MCH: 34.5 pg — ABNORMAL HIGH (ref 26.0–34.0)
MCHC: 33.2 g/dL (ref 30.0–36.0)
MCV: 104 fL — ABNORMAL HIGH (ref 80.0–100.0)
Monocytes Absolute: 0.3 10*3/uL (ref 0.1–1.0)
Monocytes Relative: 5 %
Neutro Abs: 3.6 10*3/uL (ref 1.7–7.7)
Neutrophils Relative %: 64 %
Platelets: 142 10*3/uL — ABNORMAL LOW (ref 150–400)
RBC: 3.51 MIL/uL — ABNORMAL LOW (ref 3.87–5.11)
RDW: 14 % (ref 11.5–15.5)
Smear Review: ADEQUATE
WBC: 5.7 10*3/uL (ref 4.0–10.5)
nRBC: 0 % (ref 0.0–0.2)

## 2021-01-27 LAB — COMPREHENSIVE METABOLIC PANEL
ALT: 38 U/L (ref 0–44)
AST: 29 U/L (ref 15–41)
Albumin: 4.3 g/dL (ref 3.5–5.0)
Alkaline Phosphatase: 77 U/L (ref 38–126)
Anion gap: 7 (ref 5–15)
BUN: 15 mg/dL (ref 8–23)
CO2: 29 mmol/L (ref 22–32)
Calcium: 9.1 mg/dL (ref 8.9–10.3)
Chloride: 101 mmol/L (ref 98–111)
Creatinine, Ser: 0.75 mg/dL (ref 0.44–1.00)
GFR, Estimated: >60 mL/min (ref >60)
Glucose, Bld: 94 mg/dL (ref 70–99)
Potassium: 4 mmol/L (ref 3.5–5.1)
Sodium: 137 mmol/L (ref 135–145)
Total Bilirubin: 0.7 mg/dL (ref 0.3–1.2)
Total Protein: 7.5 g/dL (ref 6.5–8.1)

## 2021-01-27 LAB — RETICULOCYTES
Immature Retic Fract: 11.6 % (ref 2.3–15.9)
RBC.: 3.49 MIL/uL — ABNORMAL LOW (ref 3.87–5.11)
Retic Count, Absolute: 49.9 10*3/uL (ref 19.0–186.0)
Retic Ct Pct: 1.4 % (ref 0.4–3.1)

## 2021-01-27 LAB — TECHNOLOGIST SMEAR REVIEW: Plt Morphology: ADEQUATE

## 2021-01-27 LAB — VITAMIN B12: Vitamin B-12: 749 pg/mL (ref 180–914)

## 2021-01-27 LAB — ABO/RH: ABO/RH(D): A POS

## 2021-01-27 LAB — HEPATITIS B SURFACE ANTIGEN: Hepatitis B Surface Ag: NONREACTIVE

## 2021-01-27 LAB — FOLATE: Folate: 18.3 ng/mL (ref >5.9)

## 2021-01-27 LAB — LACTATE DEHYDROGENASE: LDH: 153 U/L (ref 98–192)

## 2021-01-27 NOTE — Progress Notes (Signed)
Patient reports weakness, numbness and tingling in arms and hands.

## 2021-01-27 NOTE — Progress Notes (Signed)
[place] NOTE  Patient Care Team: Crecencio Mc, MD as PCP - General (Internal Medicine) Crecencio Mc, MD (Internal Medicine) Bary Castilla Forest Gleason, MD (General Surgery) De Hollingshead, RPH-CPP (Pharmacist)  CHIEF COMPLAINTS/PURPOSE OF CONSULTATION: ANEMIA   HEMATOLOGY HISTORY:  # MACROCYTIC ANEMIA [since 2022- April hb 11; MCV-103; platelet- 120s] EGD/ colonoscopy >>> 10 years ago;   HISTORY OF PRESENTING ILLNESS: Ambulating independently.  Accompanied by her friend Sarah [name] 70 y.o.  female has been referred to [place] for further evaluation/work-up for anemia/thrombocytopenia.  Patient complains of worsening fatigue over the last many months.  Admits to easy bruising.  No gum bleeding.   Blood in stools: None; stool occult- positive Change in bowel habits- None Blood in urine: None Difficulty swallowing: None Abnormal weight loss: None Iron supplementation: none Prior Blood transfusions: none Bariatric surgery: None EGD & Colonoscopy: >> years ago- colonoscopy Vaginal bleeding: None  No alcohol. No liver disease/ no hepatitis infections.   Review of Systems  Constitutional:  Positive for malaise/fatigue. Negative for chills, diaphoresis, fever and weight loss.  HENT:  Negative for nosebleeds and sore throat.   Eyes:  Negative for double vision.  Respiratory:  Negative for cough, hemoptysis, sputum production, shortness of breath and wheezing.   Cardiovascular:  Negative for chest pain, palpitations, orthopnea and leg swelling.  Gastrointestinal:  Negative for abdominal pain, blood in stool, constipation, diarrhea, heartburn, melena, nausea and vomiting.  Genitourinary:  Negative for dysuria, frequency and urgency.  Musculoskeletal:  Positive for joint pain. Negative for back pain.  Skin: Negative.  Negative for itching and rash.  Neurological:  Negative for dizziness, tingling, focal weakness, weakness and headaches.   Endo/Heme/Allergies:  Bruises/bleeds easily.  Psychiatric/Behavioral:  Negative for depression. The patient is not nervous/anxious and does not have insomnia.    MEDICAL HISTORY:  Past Medical History:  Diagnosis Date   Arthritis    Asthma    Depression    Dyspnea    with heat   Environmental and seasonal allergies    GERD (gastroesophageal reflux disease)    Presence of dental prosthetic device    Dental implants   Wheezing     SURGICAL HISTORY: Past Surgical History:  Procedure Laterality Date   ABDOMINAL HYSTERECTOMY  1989   BROW LIFT Bilateral 02/07/2020   Procedure: BLEPHAROPLASTY UPPER EYELID; W/EXCESS SKIN BROW PTOSIS REPAIR BILATERAL;  Surgeon: Karle Starch, MD;  Location: [facility];  Service: Ophthalmology;  Laterality: Bilateral;   CATARACT EXTRACTION W/PHACO Right 09/13/2016   Procedure: CATARACT EXTRACTION PHACO AND INTRAOCULAR LENS PLACEMENT (IOC);  Surgeon: Birder Robson, MD;  Location: [facility];  Service: Ophthalmology;  Laterality: Right;  [place] 00:38 AP% 14.3 CDE 5.51 Fluid pack lot # [id number]   CATARACT EXTRACTION W/PHACO Left 10/11/2016   Procedure: CATARACT EXTRACTION PHACO AND INTRAOCULAR LENS PLACEMENT (IOC);  Surgeon: Birder Robson, MD;  Location: [facility];  Service: Ophthalmology;  Laterality: Left;  [place] 00:30 AP% 11.3 CDE 3.50 fluid pack lot # [id number]   COLONOSCOPY WITH PROPOFOL N/A 09/30/2014   Procedure: COLONOSCOPY WITH PROPOFOL;  Surgeon: Robert Bellow, MD;  Location[facility] END[facility]COPY;  Service: Endoscopy;  Laterality: N/A;   ESOPHAGOGASTRODUODENOSCOPY N/A 09/30/2014   Procedure: ESOPHAGOGASTRODUODENOSCOPY (EGD);  Surgeon: Robert Bellow, MD;  Location: [facility] ENDOSCOPY;  Service: Endoscopy;  Laterality: N/A;   FOOT SURGERY Left 1985   KNEE SURGERY  2016    SOCIAL HISTORY: Social History   Socioeconomic History   Marital status: Single  Spouse name: Not on file   Number of children: Not on file   Years of education: Not on  file   Highest education level: Not on file  Occupational History   Not on file  Tobacco Use   Smoking status: Never   Smokeless tobacco: Never  Vaping Use   Vaping Use: Never used  Substance and Sexual Activity   Alcohol use: No   Drug use: No   Sexual activity: Never  Other Topics Concern   Not on file  Social History Narrative   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 No smoking; no alcohol. Lives in [place], [place] lives self/kitty.    Social Determinants of Health   Financial Resource Strain: Low Risk    Difficulty of Paying Living Expenses: Not very hard  Food Insecurity: No Food Insecurity   Worried About Charity fundraiser in the Last Year: Never true   Ran Out of Food in the Last Year: Never true  Transportation Needs: No Transportation Needs   Lack of Transportation (Medical): No   Lack of Transportation (Non-Medical): No  Physical Activity: Unknown   Days of Exercise per Week: 0 days   Minutes of Exercise per Session: Not on file  Stress: No Stress Concern Present   Feeling of Stress : Not at all  Social Connections: Unknown   Frequency of Communication with Friends and Family: More than three times a week   Frequency of Social Gatherings with Friends and Family: More than three times a week   Attends Religious Services: More than 4 times per year   Active Member of [employer] or Organizations: Yes   Attends Music therapist: More than 4 times per year   Marital Status: Not on file  Intimate Partner Violence: Not At Risk   Fear of Current or Ex-Partner: No   Emotionally Abused: No   Physically Abused: No   Sexually Abused: No    FAMILY HISTORY: Family History  Problem Relation Age of Onset   Diabetes Mother    Hyperlipidemia Mother    Heart disease Mother     ALLERGIES:  has No Known Allergies.  MEDICATIONS:  Current Outpatient Medications  Medication Sig Dispense Refill   acetaminophen (TYLENOL) 325 MG tablet Take 2 tablets (650 mg  total) by mouth 3 (three) times daily as needed. 90 tablet 3   acetaminophen (TYLENOL) 500 MG tablet Take 500 mg by mouth every 6 (six) hours as needed.     albuterol (VENTOLIN HFA) 108 (90 Base) MCG/ACT inhaler Inhale 2 puffs into the lungs every 6 (six) hours as needed for wheezing or shortness of breath. 1 each 11   amLODipine (NORVASC) 5 MG tablet Take 1 tablet (5 mg total) by mouth daily. 90 tablet 1   budesonide (PULMICORT) 0.25 MG/2ML nebulizer solution Take 2 mLs (0.25 mg total) by nebulization 2 (two) times daily. 60 mL 12   Calcium Citrate-Vitamin D (CALCIUM CITRATE + D PO) Take 1 tablet by mouth 2 (two) times daily.     citalopram (CELEXA) 20 MG tablet Take 1 tablet (20 mg total) by mouth daily. 90 tablet 1   famotidine (PEPCID) 20 MG tablet TAKE 1 TABLET BY MOUTH DAILY. BEFORE DINNER (Patient taking differently: Take 20 mg by mouth daily.) 30 tablet 11   fexofenadine (ALLEGRA) 180 MG tablet Take 180 mg by mouth daily.     Fluticasone-Umeclidin-Vilant (TRELEGY ELLIPTA) 100-62.5-25 MCG/INH AEPB Inhale 1 puff into the lungs daily. 180 each 3  gabapentin (NEURONTIN) 100 MG capsule Take 1 capsule (100 mg total) by mouth 3 (three) times daily. 90 capsule 3   ipratropium-albuterol (DUONEB) 0.5-2.5 (3) MG/3ML SOLN Take 3 mLs by nebulization every 6 (six) hours as needed. 360 mL 2   levothyroxine (SYNTHROID) 50 MCG tablet TAKE 1 TABLET EVERY DAY ON EMPTY STOMACHWITH A GLASS OF WATER AT LEAST 30-60 MINBEFORE BREAKFAST 90 tablet 1   pantoprazole (PROTONIX) 40 MG tablet TAKE ONE TABLET BY MOUTH EVERY MORNING 30 MIN BEFORE BREAKFAST 90 tablet 3   sodium chloride (OCEAN) 0.65 % SOLN nasal spray Place 2 sprays into both nostrils as needed for congestion. 30 mL 2   traZODone (DESYREL) 50 MG tablet Take 0.5-1 tablets (25-50 mg total) by mouth at bedtime as needed for sleep. 30 tablet 3   vitamin B-12 (CYANOCOBALAMIN) 1000 MCG tablet Take 1,000 mcg by mouth daily.     Benzocaine-Menthol (CEPACOL SORE  THROAT) 15-3.6 MG LOZG Use as directed 1 lozenge in the mouth or throat 2 (two) times daily as needed. (Patient not taking: No sig reported) 50 lozenge 0   No current facility-administered medications for this visit.      PHYSICAL EXAMINATION:   Vitals:   01/27/21 1424  BP: (!) 148/66  Pulse: 75  Resp: 18  Temp: 98.2 F (36.8 C)  SpO2: 97%   Filed Weights   01/27/21 1417  Weight: 182 lb (82.6 kg)    Physical Exam Vitals and nursing note reviewed.  HENT:     Head: Normocephalic and atraumatic.     Mouth/Throat:     Pharynx: Oropharynx is clear.  Eyes:     Extraocular Movements: Extraocular movements intact.     Pupils: Pupils are equal, round, and reactive to light.  Cardiovascular:     Rate and Rhythm: Normal rate and regular rhythm.  Pulmonary:     Comments: Decreased breath sounds bilaterally.  Abdominal:     Palpations: Abdomen is soft.  Musculoskeletal:        General: Normal range of motion.     Cervical back: Normal range of motion.  Skin:    General: Skin is warm.  Neurological:     General: No focal deficit present.     Mental Status: She is alert and oriented to person, place, and time.  Psychiatric:        Behavior: Behavior normal.        Judgment: Judgment normal.    LABORATORY DATA:  I have reviewed the data as listed Lab Results  Component Value Date   WBC 4.7 12/21/2020   HGB 11.1 (L) 12/21/2020   HCT 32.9 (L) 12/21/2020   MCV 103.3 (H) 12/21/2020   PLT 127.0 (L) 12/21/2020   Recent Labs    06/12/20 0836 12/10/20 0912 12/21/20 0925  NA 140 138  --   K 4.3 4.5  --   CL 103 100  --   CO2 31 32  --   GLUCOSE 104* 98  --   BUN 20 26*  --   CREATININE 0.94 0.83  --   CALCIUM 9.0 9.5  --   PROT 6.5 6.3 6.5  ALBUMIN 4.1 3.7  --   AST 11 15  --   ALT 13 18  --   ALKPHOS 66 68  --   BILITOT 0.3 0.5  --      No results found.  Macrocytic anemia #Mild macrocytic anemia/thrombocytopenia-hemoglobin around 11; platelets 127.   Normal white count.  # Long  discussion with the patient and family regarding multiple etiologies of anemia including /nutritional deficiency /liver disease etc.  Also discussed possibility of primary bone marrow disorders which would need a bone marrow biopsy to confirm.  However proceed with blood work at this time; if inconclusive would recommend a bone marrow biopsy.  Also get ultrasound of the abdomen.  #Etiology: The etiology is unclear.  Check CBC CMP LDH CRP haptoglobin;myeloma panel kappa lambda light chain; review of peripheral smear; hepatitis work-up folic acid  # DISPOSITION: # labs today-  # US abdomen ASAP # Follow up in 2 weeks; MD ; no labs- Dr.B  Thank you, [place] for allowing me to participate in the care of your pleasant patient. Please do not hesitate to contact me with questions or concerns in the interim.     All questions were answered. The patient knows to call the clinic with any problems, questions or concerns.      Sarah Sickle, MD 01/27/2021 3:25 PM

## 2021-01-27 NOTE — Assessment & Plan Note (Addendum)
#  Mild macrocytic anemia/thrombocytopenia-hemoglobin around 11; platelets 127.  Normal white count.  # Long discussion with the patient and family regarding multiple etiologies of anemia including /nutritional deficiency /liver disease etc.  Also discussed possibility of primary bone marrow disorders which would need a bone marrow biopsy to confirm.  However proceed with blood work at this time; if inconclusive would recommend a bone marrow biopsy.  Also get ultrasound of the abdomen.  #Etiology: The etiology is unclear.  Check CBC CMP LDH CRP haptoglobin;myeloma panel kappa lambda light chain; review of peripheral smear; hepatitis work-up folic acid  # DISPOSITION: # labs today-  # US abdomen ASAP # Follow up in 2 weeks; MD ; no labs- Dr.B  Thank you, Tullo for allowing me to participate in the care of your pleasant patient. Please do not hesitate to contact me with questions or concerns in the interim.

## 2021-01-28 ENCOUNTER — Ambulatory Visit
Admission: RE | Admit: 2021-01-28 | Discharge: 2021-01-28 | Disposition: A | Discharge status: Home / Self Care | Payer: Medicare Other | Source: Ambulatory Visit | Attending: Internal Medicine | Admitting: Internal Medicine

## 2021-01-28 ENCOUNTER — Other Ambulatory Visit: Payer: Self-pay

## 2021-01-28 DIAGNOSIS — K7460 Unspecified cirrhosis of liver: Secondary | ICD-10-CM | POA: Diagnosis not present

## 2021-01-28 DIAGNOSIS — N1330 Unspecified hydronephrosis: Secondary | ICD-10-CM | POA: Diagnosis not present

## 2021-01-28 DIAGNOSIS — D539 Nutritional anemia, unspecified: Secondary | ICD-10-CM | POA: Insufficient documentation

## 2021-01-28 NOTE — Telephone Encounter (Signed)
LMTCB

## 2021-01-29 LAB — HEPATITIS C ANTIBODY: HCV Ab: <0.1 s/co ratio — AB (ref 0.0–0.9)

## 2021-01-30 LAB — IMMUNOFIXATION ELECTROPHORESIS
IgA: 263 mg/dL (ref 64–422)
IgG (Immunoglobin G), Serum: 858 mg/dL (ref 586–1602)
IgM (Immunoglobulin M), Srm: 93 mg/dL (ref 26–217)
Total Protein ELP: 6.6 g/dL (ref 6.0–8.5)

## 2021-02-01 ENCOUNTER — Telehealth: Payer: Self-pay | Admitting: Pharmacy Technician

## 2021-02-01 DIAGNOSIS — Z596 Low income: Secondary | ICD-10-CM

## 2021-02-01 NOTE — Progress Notes (Signed)
[facility])                                            [place] Team    02/01/2021  Sarah [name] 02/13/1951 [id number]   Care coordination call placed to [place] in regards to Trelegy application.  Spoke to [place] who informed patient was APPROVED 01/27/21-04/24/21. She informed medication was processed on 01/27/21 and should be delivered to the patient's home in the next 7-10 business days.  [name] P. Billye Pickerel, [place]  [phone]

## 2021-02-03 ENCOUNTER — Other Ambulatory Visit: Payer: Self-pay

## 2021-02-03 ENCOUNTER — Ambulatory Visit (INDEPENDENT_AMBULATORY_CARE_PROVIDER_SITE_OTHER): Payer: Medicare Other

## 2021-02-03 VITALS — BP 125/63 | HR 85

## 2021-02-03 DIAGNOSIS — I10 Essential (primary) hypertension: Secondary | ICD-10-CM | POA: Diagnosis not present

## 2021-02-03 NOTE — Progress Notes (Signed)
Patient is here for a BP check due to bp being high at last visit, as per patient.  Currently patients BP is 125/63 and BPM is 85. Patients BP machine was not accurate at all. Patient will be buying another one to check at a later date.  Patient has no complaints of headaches, blurry vision, chest pain, arm pain, light headedness, dizziness, and nor jaw pain. Please see previous note for order.

## 2021-02-03 NOTE — Telephone Encounter (Signed)
See nurse visit report from 02/03/2021.

## 2021-02-05 ENCOUNTER — Ambulatory Visit: Payer: Medicare Other | Admitting: Internal Medicine

## 2021-02-08 ENCOUNTER — Telehealth: Payer: Self-pay | Admitting: Internal Medicine

## 2021-02-08 NOTE — Telephone Encounter (Signed)
Will notify CPhT

## 2021-02-08 NOTE — Telephone Encounter (Signed)
Patient called in and would like for you to know she received her 3 prescriptions of Trelogy last week.She has been staying with a friend who had surgery and did not go home until Saturday.

## 2021-02-10 ENCOUNTER — Other Ambulatory Visit: Payer: Self-pay

## 2021-02-10 ENCOUNTER — Inpatient Hospital Stay: Payer: Medicare Other | Admitting: Internal Medicine

## 2021-02-10 ENCOUNTER — Encounter: Payer: Self-pay | Admitting: Internal Medicine

## 2021-02-10 DIAGNOSIS — C787 Secondary malignant neoplasm of liver and intrahepatic bile duct: Secondary | ICD-10-CM | POA: Diagnosis not present

## 2021-02-10 DIAGNOSIS — D539 Nutritional anemia, unspecified: Secondary | ICD-10-CM

## 2021-02-10 NOTE — Assessment & Plan Note (Addendum)
#  Mild macrocytic anemia/thrombocytopenia-hemoglobin around 11-12; platelets 127-140s.  Normal white count.  #Work-up including HIV/hepatitis negative for any explanation for patient's mild macrocytic anemia/thrombocytopenia.  Given the slight improvement/lack of worsening of the blood work I think is reasonable to hold off a bone marrow biopsy at this time; I will reevaluate the blood counts in 4 months.  If worsening would recommend a bone marrow biopsy.  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  Low clinical concern for metastatic disease; however need to rule out primary [place] versus others.   #Incidental ultrasound findings:Marland Kitchen Mild to moderate bilateral hydronephrosis; discussed with urology.  Recommended referral to urology given patient's urinary symptoms/and need for further work-up.  # DISPOSITION: will call re: MRI  # refer to urology re: hydronephrosis. # MRI Liver 1-2 weeks # Follow up in 4 months-  MD ; cbc/cmp/LDH- Dr.B

## 2021-02-10 NOTE — Progress Notes (Signed)
[place] [facility] NOTE  Patient Care Team: Crecencio Mc, MD as PCP - General (Internal Medicine) Crecencio Mc, MD (Internal Medicine) Bary Castilla Forest Gleason, MD (General Surgery) De Hollingshead, RPH-CPP (Pharmacist)  CHIEF COMPLAINTS/PURPOSE OF CONSULTATION: ANEMIA   HEMATOLOGY HISTORY:  # MACROCYTIC ANEMIA [since 2022- April hb 11; MCV-103; platelet- 120s] EGD/ colonoscopy >>> 10 years ago; OCT 2022-slight improvement blood counts/stable; hepatitis HIV negative.  Myeloma panel negative.  HOLD off  bone marrow biopsy  #Incidental right hepatic lobe 13 mm hypoechoic lesion [ultrasound-October 2022]-MRI pending  #October 2022-ultrasound incidental bilateral mild to moderate hydronephrosis.-Referral to urology   HISTORY OF PRESENTING ILLNESS: Ambulating independently.  Accompanied by her friend CHARIS JULIANA 70 y.o.  female is here for follow-up today with the blood work ultrasound that was ordered for patient's  anemia/thrombocytopenia.  Patient continues to have worsening fatigue intermittent.  Easy bruising.  No gum bleeding.  Patient complains of worsening fatigue over the last many months.  Admits to easy bruising.  No gum bleeding.   Chronic increased frequency of urination.  Review of Systems  Constitutional:  Positive for malaise/fatigue. Negative for chills, diaphoresis, fever and weight loss.  HENT:  Negative for nosebleeds and sore throat.   Eyes:  Negative for double vision.  Respiratory:  Negative for cough, hemoptysis, sputum production, shortness of breath and wheezing.   Cardiovascular:  Negative for chest pain, palpitations, orthopnea and leg swelling.  Gastrointestinal:  Negative for abdominal pain, blood in stool, constipation, diarrhea, heartburn, melena, nausea and vomiting.  Genitourinary:  Negative for dysuria, frequency and urgency.  Musculoskeletal:  Positive for joint pain. Negative for back pain.  Skin: Negative.  Negative for  itching and rash.  Neurological:  Negative for dizziness, tingling, focal weakness, weakness and headaches.  Endo/Heme/Allergies:  Bruises/bleeds easily.  Psychiatric/Behavioral:  Negative for depression. The patient is not nervous/anxious and does not have insomnia.    MEDICAL HISTORY:  Past Medical History:  Diagnosis Date   Arthritis    Asthma    Depression    Dyspnea    with heat   Environmental and seasonal allergies    GERD (gastroesophageal reflux disease)    Presence of dental prosthetic device    Dental implants   Wheezing     SURGICAL HISTORY: Past Surgical History:  Procedure Laterality Date   ABDOMINAL HYSTERECTOMY  1989   BROW LIFT Bilateral 02/07/2020   Procedure: BLEPHAROPLASTY UPPER EYELID; W/EXCESS SKIN BROW PTOSIS REPAIR BILATERAL;  Surgeon: Karle Starch, MD;  Location: [facility];  Service: Ophthalmology;  Laterality: Bilateral;   CATARACT EXTRACTION W/PHACO Right 09/13/2016   Procedure: CATARACT EXTRACTION PHACO AND INTRAOCULAR LENS PLACEMENT (IOC);  Surgeon: Birder Robson, MD;  Location: [facility];  Service: Ophthalmology;  Laterality: Right;  [place] 00:38 AP% 14.3 CDE 5.51 Fluid pack lot # [id number]   CATARACT EXTRACTION W/PHACO Left 10/11/2016   Procedure: CATARACT EXTRACTION PHACO AND INTRAOCULAR LENS PLACEMENT (IOC);  Surgeon: Birder Robson, MD;  Location: [facility];  Service: Ophthalmology;  Laterality: Left;  [place] 00:30 AP% 11.3 CDE 3.50 fluid pack lot # [id number]   COLONOSCOPY WITH PROPOFOL N/A 09/30/2014   Procedure: COLONOSCOPY WITH PROPOFOL;  Surgeon: Robert Bellow, MD;  [facility]: [facility] END[facility]COPY;  Service: Endoscopy;  Laterality: N/A;   ESOPHAGOGASTRODUODENOSCOPY N/A 09/30/2014   Procedure: ESOPHAGOGASTRODUODENOSCOPY (EGD);  Surgeon: Robert Bellow, MD;  Location: [facility]COPY;  Service: Endoscopy;  Laterality: N/A;   FOOT SURGERY Left 1985   KNEE SURGERY  2016  SOCIAL HISTORY: Social History   Socioeconomic History   Marital  status: Single    Spouse name: Not on file   Number of children: Not on file   Years of education: Not on file   Highest education level: Not on file  Occupational History   Not on file  Tobacco Use   Smoking status: Never   Smokeless tobacco: Never  Vaping Use   Vaping Use: Never used  Substance and Sexual Activity   Alcohol use: No   Drug use: No   Sexual activity: Never  Other Topics Concern   Not on file  Social History Narrative   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 No smoking; no alcohol. Lives in [place], [place] lives self/kitty.    Social Determinants of Health   Financial Resource Strain: Low Risk    Difficulty of Paying Living Expenses: Not very hard  Food Insecurity: No Food Insecurity   Worried About Charity fundraiser in the Last Year: Never true   Ran Out of Food in the Last Year: Never true  Transportation Needs: No Transportation Needs   Lack of Transportation (Medical): No   Lack of Transportation (Non-Medical): No  Physical Activity: Unknown   Days of Exercise per Week: 0 days   Minutes of Exercise per Session: Not on file  Stress: No Stress Concern Present   Feeling of Stress : Not at all  Social Connections: Unknown   Frequency of Communication with Friends and Family: More than three times a week   Frequency of Social Gatherings with Friends and Family: More than three times a week   Attends Religious Services: More than 4 times per year   Active Member of [employer] or Organizations: Yes   Attends Music therapist: More than 4 times per year   Marital Status: Not on file  Intimate Partner Violence: Not At Risk   Fear of Current or Ex-Partner: No   Emotionally Abused: No   Physically Abused: No   Sexually Abused: No    FAMILY HISTORY: Family History  Problem Relation Age of Onset   Diabetes Mother    Hyperlipidemia Mother    Heart disease Mother     ALLERGIES:  has No Known Allergies.  MEDICATIONS:  Current Outpatient  Medications  Medication Sig Dispense Refill   acetaminophen (TYLENOL) 325 MG tablet Take 2 tablets (650 mg total) by mouth 3 (three) times daily as needed. 90 tablet 3   acetaminophen (TYLENOL) 500 MG tablet Take 500 mg by mouth every 6 (six) hours as needed.     albuterol (VENTOLIN HFA) 108 (90 Base) MCG/ACT inhaler Inhale 2 puffs into the lungs every 6 (six) hours as needed for wheezing or shortness of breath. 1 each 11   amLODipine (NORVASC) 5 MG tablet Take 1 tablet (5 mg total) by mouth daily. 90 tablet 1   budesonide (PULMICORT) 0.25 MG/2ML nebulizer solution Take 2 mLs (0.25 mg total) by nebulization 2 (two) times daily. 60 mL 12   Calcium Citrate-Vitamin D (CALCIUM CITRATE + D PO) Take 1 tablet by mouth 2 (two) times daily.     citalopram (CELEXA) 20 MG tablet Take 1 tablet (20 mg total) by mouth daily. 90 tablet 1   famotidine (PEPCID) 20 MG tablet TAKE 1 TABLET BY MOUTH DAILY. BEFORE DINNER (Patient taking differently: Take 20 mg by mouth daily.) 30 tablet 11   fexofenadine (ALLEGRA) 180 MG tablet Take 180 mg by mouth daily.     Fluticasone-Umeclidin-Vilant (TRELEGY  ELLIPTA) 100-62.5-25 MCG/INH AEPB Inhale 1 puff into the lungs daily. 180 each 3   gabapentin (NEURONTIN) 100 MG capsule Take 1 capsule (100 mg total) by mouth 3 (three) times daily. 90 capsule 3   ipratropium-albuterol (DUONEB) 0.5-2.5 (3) MG/3ML SOLN Take 3 mLs by nebulization every 6 (six) hours as needed. 360 mL 2   levothyroxine (SYNTHROID) 50 MCG tablet TAKE 1 TABLET EVERY DAY ON EMPTY STOMACHWITH A GLASS OF WATER AT LEAST 30-60 MINBEFORE BREAKFAST 90 tablet 1   pantoprazole (PROTONIX) 40 MG tablet TAKE ONE TABLET BY MOUTH EVERY MORNING 30 MIN BEFORE BREAKFAST 90 tablet 3   sodium chloride (OCEAN) 0.65 % SOLN nasal spray Place 2 sprays into both nostrils as needed for congestion. 30 mL 2   traZODone (DESYREL) 50 MG tablet Take 0.5-1 tablets (25-50 mg total) by mouth at bedtime as needed for sleep. 30 tablet 3   vitamin  B-12 (CYANOCOBALAMIN) 1000 MCG tablet Take 1,000 mcg by mouth daily.     Benzocaine-Menthol (CEPACOL SORE THROAT) 15-3.6 MG LOZG Use as directed 1 lozenge in the mouth or throat 2 (two) times daily as needed. (Patient not taking: No sig reported) 50 lozenge 0   No current facility-administered medications for this visit.      PHYSICAL EXAMINATION:   Vitals:   02/10/21 1108  BP: 137/74  Pulse: 78  Resp: 20  Temp: 97.6 F (36.4 C)   Filed Weights   02/10/21 1108  Weight: 182 lb (82.6 kg)    Physical Exam Vitals and nursing note reviewed.  HENT:     Head: Normocephalic and atraumatic.     Mouth/Throat:     Pharynx: Oropharynx is clear.  Eyes:     Extraocular Movements: Extraocular movements intact.     Pupils: Pupils are equal, round, and reactive to light.  Cardiovascular:     Rate and Rhythm: Normal rate and regular rhythm.  Pulmonary:     Comments: Decreased breath sounds bilaterally.  Abdominal:     Palpations: Abdomen is soft.  Musculoskeletal:        General: Normal range of motion.     Cervical back: Normal range of motion.  Skin:    General: Skin is warm.  Neurological:     General: No focal deficit present.     Mental Status: She is alert and oriented to person, place, and time.  Psychiatric:        Behavior: Behavior normal.        Judgment: Judgment normal.    LABORATORY DATA:  I have reviewed the data as listed Lab Results  Component Value Date   WBC 5.7 01/27/2021   HGB 12.1 01/27/2021   HCT 36.5 01/27/2021   MCV 104.0 (H) 01/27/2021   PLT 142 (L) 01/27/2021   Recent Labs    06/12/20 0836 12/10/20 0912 12/21/20 0925 01/27/21 1518  NA 140 138  --  137  K 4.3 4.5  --  4.0  CL 103 100  --  101  CO2 31 32  --  29  GLUCOSE 104* 98  --  94  BUN 20 26*  --  15  CREATININE 0.94 0.83  --  0.75  CALCIUM 9.0 9.5  --  9.1  GFRNONAA  --   --   --  >60  PROT 6.5 6.3 6.5 7.5  ALBUMIN 4.1 3.7  --  4.3  AST 11 15  --  29  ALT 13 18  --  38   ALKPHOS  66 68  --  77  BILITOT 0.3 0.5  --  0.7     US Abdomen Complete  Result Date: 01/29/2021 CLINICAL DATA:  Cirrhosis EXAM: ABDOMEN ULTRASOUND COMPLETE COMPARISON:  None. FINDINGS: Gallbladder: No gallstones or wall thickening visualized. No sonographic Murphy sign noted by sonographer. Common bile duct: Diameter: 2.5 mm Liver: Echogenicity within normal limits. Hypoechoic mass within the right hepatic lobe measuring 11 x 9 x 13 mm. Portal vein is patent on color Doppler imaging with normal direction of blood flow towards the liver. IVC: No abnormality visualized. Pancreas: Visualized portion unremarkable. Spleen: Size and appearance within normal limits. Right Kidney: Length: 9.1 cm. Echogenicity within normal limits. Mild to moderate hydronephrosis with dilated extrarenal pelvis. Left Kidney: Length: 11.5 cm. Echogenicity within normal limits. No mass. Moderate hydronephrosis. Abdominal aorta: No aneurysm visualized. Other findings: None. IMPRESSION: 1. 13 mm hypoechoic mass within the liver. Consider correlation with MRI. 2. Negative for splenomegaly 3. Mild to moderate bilateral hydronephrosis Electronically Signed   By: Donavan Foil M.D.   On: 01/29/2021 19:56    Macrocytic anemia #Mild macrocytic anemia/thrombocytopenia-hemoglobin around 11-12; platelets 127-140s.  Normal white count.  #Work-up including HIV/hepatitis negative for any explanation for patient's mild macrocytic anemia/thrombocytopenia.  Given the slight improvement/lack of worsening of the blood work I think is reasonable to hold off a bone marrow biopsy at this time; I will reevaluate the blood counts in 4 months.  If worsening would recommend a bone marrow biopsy.  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  Low clinical concern for metastatic disease; however need to rule out primary [place] versus others.   #Incidental ultrasound findings:Marland Kitchen Mild to moderate bilateral hydronephrosis; discussed with  urology.  Recommended referral to urology given patient's urinary symptoms/and need for further work-up.  # DISPOSITION: will call re: MRI  # refer to urology re: hydronephrosis. # MRI Liver 1-2 weeks # Follow up in 4 months-  MD ; cbc/cmp/LDH- Dr.B        All questions were answered. The patient knows to call the clinic with any problems, questions or concerns.      Cammie Sickle, MD 02/10/2021 2:43 PM

## 2021-02-18 ENCOUNTER — Other Ambulatory Visit: Payer: Self-pay | Admitting: Internal Medicine

## 2021-02-19 ENCOUNTER — Ambulatory Visit
Admission: RE | Admit: 2021-02-19 | Discharge: 2021-02-19 | Disposition: A | Discharge status: Home / Self Care | Payer: Medicare Other | Source: Ambulatory Visit | Attending: Internal Medicine | Admitting: Internal Medicine

## 2021-02-19 ENCOUNTER — Other Ambulatory Visit: Payer: Self-pay

## 2021-02-19 DIAGNOSIS — K7689 Other specified diseases of liver: Secondary | ICD-10-CM | POA: Diagnosis not present

## 2021-02-19 DIAGNOSIS — C787 Secondary malignant neoplasm of liver and intrahepatic bile duct: Secondary | ICD-10-CM | POA: Insufficient documentation

## 2021-02-19 DIAGNOSIS — Z853 Personal history of malignant neoplasm of breast: Secondary | ICD-10-CM | POA: Diagnosis not present

## 2021-02-19 DIAGNOSIS — N281 Cyst of kidney, acquired: Secondary | ICD-10-CM | POA: Diagnosis not present

## 2021-02-19 MED ORDER — GADOBUTROL 1 MMOL/ML IV SOLN
7.5000 mL | Freq: Once | INTRAVENOUS | Status: AC | PRN
  Administered 2021-02-19: 7.5 mL via INTRAVENOUS

## 2021-02-23 ENCOUNTER — Encounter: Payer: Self-pay | Admitting: Gastroenterology

## 2021-02-23 ENCOUNTER — Ambulatory Visit: Payer: Medicare Other | Admitting: Gastroenterology

## 2021-02-23 ENCOUNTER — Other Ambulatory Visit: Payer: Self-pay

## 2021-02-23 VITALS — BP 148/79 | HR 75 | Temp 98.4°F | Ht 63.0 in | Wt 182.6 lb

## 2021-02-23 DIAGNOSIS — R195 Other fecal abnormalities: Secondary | ICD-10-CM | POA: Diagnosis not present

## 2021-02-23 DIAGNOSIS — K219 Gastro-esophageal reflux disease without esophagitis: Secondary | ICD-10-CM

## 2021-02-23 MED ORDER — PEG 3350-KCL-NA BICARB-NACL 420 G PO SOLR: 4000.0000 mL | Freq: Once | ORAL | 0 refills | Status: AC

## 2021-02-23 NOTE — Progress Notes (Signed)
Cephas Darby, MD [address]  [place]  [place], [place] [address]  Main: [phone]  Fax: [phone]    Gastroenterology Consultation  Referring Provider:     Crecencio Mc, MD Primary Care Physician:  Crecencio Mc, MD Primary Gastroenterologist:  Dr. Cephas Darby Reason for Consultation:     FOBT positive, chronic heartburn        HPI:   Sarah [name] is a 70 y.o. female referred by Dr. Crecencio Mc, MD  for consultation & management of FOBT positive and chronic heartburn.  Patient reports that she has been experiencing heartburn for several years and takes Protonix 40 mg.  She used to take 40 mg twice daily, tends to forget the evening dose, currently on once daily which helps control her heartburn.  She does not have any other GI symptoms.  She is also found to have fecal occult blood positive in 11/2020.  She is found to have B12 deficiency which has been treated and her anemia has improved.  B12 levels are normal.  No evidence of iron deficiency.  Patient is evaluated by Dr. Rogue Bussing for mild macrocytic anemia.  This is secondary to B12 deficiency which has been corrected and anemia resolved.  She underwent MRI liver mass protocol for indeterminate lesion that was found on ultrasound, not suspicious for metastatic disease or hepatocellular carcinoma  Patient is accompanied today with her girlfriend  Patient does not smoke or drink alcohol  NSAIDs: None  Antiplts/Anticoagulants/Anti thrombotics: None  GI Procedures:  EGD and colonoscopy by Dr. Bary Castilla in 2016  - Widely patent Schatzki ring. - One gastroesophageal junction polyp. Biopsied. - Multiple gastric polyps. Biopsied. - Chronic gastritis. Biopsied. - Normal examined duodenum.  - The examined portion of the ileum was normal. - The entire examined colon is normal on direct and retroflexion views. - Random biopsies of the right and left colon were obtained.  DIAGNOSIS:  A. STOMACH,  ANTRUM; COLD BIOPSY:  - MILD REACTIVE GASTROPATHY WITH FOCAL ACTIVE INFLAMMATION.  - NO INTESTINAL METAPLASIA, DYSPLASIA, OR MALIGNANCY.  - NO HELICOBACTER PYLORI SEEN IN HEMATOXYLIN AND EOSIN SECTIONS.   B. STOMACH POLYPS; COLD BIOPSY:  - FUNDIC GLAND POLYPS, MULTIPLE FRAGMENTS.  - NEGATIVE FOR DYSPLASIA AND MALIGNANCY.   C. GASTROESOPHAGEAL JUNCTION POLYP; COLD BIOPSY:  - HYPERPLASTIC POLYP, GASTRIC FOVEOLAR TYPE, INVOLVING SQUAMOCOLUMNAR  MUCOSA.  - MODERATE CHRONIC ACTIVE INFLAMMATION SUGGESTIVE OF GASTROESOPHAGEAL  REFLUX.  - NEGATIVE FOR GOBLET CELLS, DYSPLASIA, AND MALIGNANCY.   D. RIGHT COLON; COLD BIOPSY:  - NEGATIVE FOR COLITIS.   E. LEFT COLON; COLD BIOPSY:  - NEGATIVE FOR COLITIS.   Past Medical History:  Diagnosis Date   Arthritis    Asthma    Depression    Dyspnea    with heat   Environmental and seasonal allergies    GERD (gastroesophageal reflux disease)    Presence of dental prosthetic device    Dental implants   Wheezing     Past Surgical History:  Procedure Laterality Date   ABDOMINAL HYSTERECTOMY  1989   BROW LIFT Bilateral 02/07/2020   Procedure: BLEPHAROPLASTY UPPER EYELID; W/EXCESS SKIN BROW PTOSIS REPAIR BILATERAL;  Surgeon: Karle Starch, MD;  Location: [place];  Service: Ophthalmology;  Laterality: Bilateral;   CATARACT EXTRACTION W/PHACO Right 09/13/2016   Procedure: CATARACT EXTRACTION PHACO AND INTRAOCULAR LENS PLACEMENT (IOC);  Surgeon: Birder Robson, MD;  Location: [facility];  Service: Ophthalmology;  Laterality: Right;  [place] 00:38 AP%  14.3 CDE 5.51 Fluid pack lot # [id number]   CATARACT EXTRACTION W/PHACO Left 10/11/2016   Procedure: CATARACT EXTRACTION PHACO AND INTRAOCULAR LENS PLACEMENT (IOC);  Surgeon: Birder Robson, MD;  Location: [facility];  Service: Ophthalmology;  Laterality: Left;  [place] 00:30 AP% 11.3 CDE 3.50 fluid pack lot # [id number]   COLONOSCOPY WITH PROPOFOL N/A 09/30/2014   Procedure: COLONOSCOPY WITH PROPOFOL;   Surgeon: Robert Bellow, MD;  Location: [facility] ENDOSCOPY;  Service: Endoscopy;  Laterality: N/A;   ESOPHAGOGASTRODUODENOSCOPY N/A 09/30/2014   Procedure: ESOPHAGOGASTRODUODENOSCOPY (EGD);  Surgeon: Robert Bellow, MD;  Location: [facility] ENDOSCOPY;  Service: Endoscopy;  Laterality: N/A;   FOOT SURGERY Left 1985   KNEE SURGERY  2016    Current Outpatient Medications:    acetaminophen (TYLENOL) 325 MG tablet, Take 2 tablets (650 mg total) by mouth 3 (three) times daily as needed., Disp: 90 tablet, Rfl: 3   acetaminophen (TYLENOL) 500 MG tablet, Take 500 mg by mouth every 6 (six) hours as needed., Disp: , Rfl:    albuterol (VENTOLIN HFA) 108 (90 Base) MCG/ACT inhaler, Inhale 2 puffs into the lungs every 6 (six) hours as needed for wheezing or shortness of breath., Disp: 1 each, Rfl: 11   amLODipine (NORVASC) 5 MG tablet, Take 1 tablet (5 mg total) by mouth daily., Disp: 90 tablet, Rfl: 1   Benzocaine-Menthol (CEPACOL SORE THROAT) 15-3.6 MG LOZG, Use as directed 1 lozenge in the mouth or throat 2 (two) times daily as needed., Disp: 50 lozenge, Rfl: 0   budesonide (PULMICORT) 0.25 MG/2ML nebulizer solution, Take 2 mLs (0.25 mg total) by nebulization 2 (two) times daily., Disp: 60 mL, Rfl: 12   Calcium Citrate-Vitamin D (CALCIUM CITRATE + D PO), Take 1 tablet by mouth 2 (two) times daily., Disp: , Rfl:    citalopram (CELEXA) 20 MG tablet, Take 1 tablet (20 mg total) by mouth daily., Disp: 90 tablet, Rfl: 1   famotidine (PEPCID) 20 MG tablet, TAKE 1 TABLET BY MOUTH DAILY. BEFORE DINNER (Patient taking differently: Take 20 mg by mouth daily.), Disp: 30 tablet, Rfl: 11   fexofenadine (ALLEGRA) 180 MG tablet, Take 180 mg by mouth daily., Disp: , Rfl:    Fluticasone-Umeclidin-Vilant (TRELEGY ELLIPTA) 100-62.5-25 MCG/INH AEPB, Inhale 1 puff into the lungs daily., Disp: 180 each, Rfl: 3   gabapentin (NEURONTIN) 100 MG capsule, TAKE 1 CAPSULE BY MOUTH 3 TIMES DAILY, Disp: 90 capsule, Rfl: 3    ipratropium-albuterol (DUONEB) 0.5-2.5 (3) MG/3ML SOLN, Take 3 mLs by nebulization every 6 (six) hours as needed., Disp: 360 mL, Rfl: 2   levothyroxine (SYNTHROID) 50 MCG tablet, TAKE 1 TABLET EVERY DAY ON EMPTY STOMACHWITH A GLASS OF WATER AT LEAST 30-60 MINBEFORE BREAKFAST, Disp: 90 tablet, Rfl: 1   pantoprazole (PROTONIX) 40 MG tablet, TAKE ONE TABLET BY MOUTH EVERY MORNING 30 MIN BEFORE BREAKFAST, Disp: 90 tablet, Rfl: 3   polyethylene glycol-electrolytes (NULYTELY) 420 g solution, Take 4,000 mLs by mouth once for 1 dose., Disp: 4000 mL, Rfl: 0   sodium chloride (OCEAN) 0.65 % SOLN nasal spray, Place 2 sprays into both nostrils as needed for congestion., Disp: 30 mL, Rfl: 2   traZODone (DESYREL) 50 MG tablet, Take 0.5-1 tablets (25-50 mg total) by mouth at bedtime as needed for sleep., Disp: 30 tablet, Rfl: 3   vitamin B-12 (CYANOCOBALAMIN) 1000 MCG tablet, Take 1,000 mcg by mouth daily., Disp: , Rfl:     Family History  Problem Relation Age of Onset   Diabetes Mother  Hyperlipidemia Mother    Heart disease Mother      Social History   Tobacco Use   Smoking status: Never   Smokeless tobacco: Never  Vaping Use   Vaping Use: Never used  Substance Use Topics   Alcohol use: No   Drug use: No    Allergies as of 02/23/2021   (No Known Allergies)    Review of Systems:    All systems reviewed and negative except where noted in HPI.   Physical Exam:  BP (!) 148/79 (BP Location: Left Arm, Patient Position: Sitting, Cuff Size: Normal)   Pulse 75   Temp 98.4 F (36.9 C) (Oral)   Ht 5\' 3"  (1.6 m)   Wt 182 lb 9.6 oz (82.8 kg)   BMI 32.35 kg/m  No LMP recorded. Patient has had a hysterectomy.  General:   Alert,  Well-developed, well-nourished, pleasant and cooperative in NAD Head:  Normocephalic and atraumatic. Eyes:  Sclera clear, no icterus.   Conjunctiva pink. Ears:  Normal auditory acuity. Nose:  No deformity, discharge, or lesions. Mouth:  No deformity or  lesions,oropharynx pink & moist. Neck:  Supple; no masses or thyromegaly. Lungs:  Respirations even and unlabored.  Clear throughout to auscultation.   No wheezes, crackles, or rhonchi. No acute distress. Heart:  Regular rate and rhythm; no murmurs, clicks, rubs, or gallops. Abdomen:  Normal bowel sounds. Soft, non-tender and non-distended without masses, hepatosplenomegaly or hernias noted.  No guarding or rebound tenderness.   Rectal: Not performed Msk:  Symmetrical without gross deformities. Good, equal movement & strength bilaterally. Pulses:  Normal pulses noted. Extremities:  No clubbing or edema.  No cyanosis. Neurologic:  Alert and oriented x3;  grossly normal neurologically. Skin:  Intact without significant lesions or rashes. No jaundice. Psych:  Alert and cooperative. Normal mood and affect.  Imaging Studies: Reviewed  Assessment and Plan:   VERLENA MARLETTE is a 70 y.o. 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  If unremarkable, will try to wean off PPI and patient is agreeable Continue Protonix 40 mg daily before breakfast for now Discussed about antireflux lifestyle  FOBT positive Recommend colonoscopy and patient is agreeable  I have discussed alternative options, risks & benefits,  which include, but are not limited to, bleeding, infection, perforation,respiratory complication & drug reaction.  The patient agrees with this plan & written consent will be obtained.     Follow up after above work-up   Cephas Darby, MD

## 2021-02-26 ENCOUNTER — Encounter: Payer: Self-pay | Admitting: Internal Medicine

## 2021-02-26 ENCOUNTER — Encounter: Payer: Self-pay | Admitting: *Deleted

## 2021-02-26 ENCOUNTER — Other Ambulatory Visit: Payer: Self-pay | Admitting: *Deleted

## 2021-02-26 DIAGNOSIS — N1330 Unspecified hydronephrosis: Secondary | ICD-10-CM

## 2021-02-26 DIAGNOSIS — D539 Nutritional anemia, unspecified: Secondary | ICD-10-CM

## 2021-02-26 NOTE — Progress Notes (Signed)
Spoke to patient regarding results of the MRI not concerning for malignancy. I can follow up MRI in six months.  Discussed with [facility] regarding urology referral.  Go

## 2021-03-04 ENCOUNTER — Other Ambulatory Visit: Payer: Self-pay

## 2021-03-04 DIAGNOSIS — N1330 Unspecified hydronephrosis: Secondary | ICD-10-CM

## 2021-03-05 ENCOUNTER — Other Ambulatory Visit: Payer: Self-pay

## 2021-03-05 ENCOUNTER — Encounter: Payer: Self-pay | Admitting: Urology

## 2021-03-05 ENCOUNTER — Other Ambulatory Visit
Admission: RE | Admit: 2021-03-05 | Discharge: 2021-03-05 | Disposition: A | Discharge status: Home / Self Care | Payer: Medicare Other | Attending: Urology | Admitting: Urology

## 2021-03-05 ENCOUNTER — Ambulatory Visit: Payer: Medicare Other | Admitting: Urology

## 2021-03-05 VITALS — BP 126/65 | HR 81 | Ht 63.0 in | Wt 185.0 lb

## 2021-03-05 DIAGNOSIS — N3946 Mixed incontinence: Secondary | ICD-10-CM

## 2021-03-05 DIAGNOSIS — N1330 Unspecified hydronephrosis: Secondary | ICD-10-CM | POA: Diagnosis not present

## 2021-03-05 DIAGNOSIS — N393 Stress incontinence (female) (male): Secondary | ICD-10-CM | POA: Diagnosis not present

## 2021-03-05 LAB — URINALYSIS, COMPLETE (UACMP) WITH MICROSCOPIC
Bacteria, UA: NONE SEEN
Bilirubin Urine: NEGATIVE
Glucose, UA: NEGATIVE mg/dL
Hgb urine dipstick: NEGATIVE
Ketones, ur: NEGATIVE mg/dL
Nitrite: NEGATIVE
Protein, ur: NEGATIVE mg/dL
Specific Gravity, Urine: 1.025 (ref 1.005–1.030)
pH: 6.5 (ref 5.0–8.0)

## 2021-03-05 LAB — BLADDER SCAN AMB NON-IMAGING

## 2021-03-05 MED ORDER — MIRABEGRON ER 50 MG PO TB24: 50.0000 mg | ORAL_TABLET | Freq: Every day | ORAL | 0 refills | Status: DC

## 2021-03-05 NOTE — Patient Instructions (Signed)
Call with sample results, if samples work well a script will then be sent in   Overactive Bladder, Adult Overactive bladder is a condition in which a person has a sudden and frequent need to urinate. A person might also leak urine if he or she cannot get to the bathroom fast enough (urinary incontinence). Sometimes, symptoms can interfere with work or social activities. What are the causes? Overactive bladder is associated with poor nerve signals between your bladder and your brain. Your bladder may get the signal to empty before it is full. You may also have very sensitive muscles that make your bladder squeeze too soon. This condition may also be caused by other factors, such as: Medical conditions: Urinary tract infection. Infection of nearby tissues. Prostate enlargement. Bladder stones, inflammation, or tumors. Diabetes. Muscle or nerve weakness, especially from these conditions: A spinal cord injury. Stroke. Multiple sclerosis. Parkinson's disease. Other causes: Surgery on the uterus or urethra. Drinking too much caffeine or alcohol. Certain medicines, especially those that eliminate extra fluid in the body (diuretics). Constipation. What increases the risk? You may be at greater risk for overactive bladder if you: Are an older adult. Smoke. Are going through menopause. Have prostate problems. Have a neurological disease, such as stroke, dementia, Parkinson's disease, or multiple sclerosis (MS). Eat or drink alcohol, spicy food, caffeine, and other things that irritate the bladder. Are overweight or obese. What are the signs or symptoms? Symptoms of this condition include a sudden, strong urge to urinate. Other symptoms include: Leaking urine. Urinating 8 or more times a day. Waking up to urinate 2 or more times overnight. How is this diagnosed? This condition may be diagnosed based on: Your symptoms and medical history. A physical exam. Blood or urine tests to check for  possible causes, such as infection. You may also need to see a health care provider who specializes in urinary tract problems. This is called a urologist. How is this treated? Treatment for overactive bladder depends on the cause of your condition and whether it is mild or severe. Treatment may include: Bladder training, such as: Learning to control the urge to urinate by following a schedule to urinate at regular intervals. Doing Kegel exercises to strengthen the pelvic floor muscles that support your bladder. Special devices, such as: Biofeedback. This uses sensors to help you become aware of your body's signals. Electrical stimulation. This uses electrodes placed inside the body (implanted) or outside the body. These electrodes send gentle pulses of electricity to strengthen the nerves or muscles that control the bladder. Women may use a plastic device, called a pessary, that fits into the vagina and supports the bladder. Medicines, such as: Antibiotics to treat bladder infection. Antispasmodics to stop the bladder from releasing urine at the wrong time. Tricyclic antidepressants to relax bladder muscles. Injections of botulinum toxin type A directly into the bladder tissue to relax bladder muscles. Surgery, such as: A device may be implanted to help manage the nerve signals that control urination. An electrode may be implanted to stimulate electrical signals in the bladder. A procedure may be done to change the shape of the bladder. This is done only in very severe cases. Follow these instructions at home: Eating and drinking  Make diet or lifestyle changes recommended by your health care provider. These may include: Drinking fluids throughout the day and not only with meals. Cutting down on caffeine or alcohol. Eating a healthy and balanced diet to prevent constipation. This may include: Choosing foods that are  high in fiber, such as beans, whole grains, and fresh fruits and  vegetables. Limiting foods that are high in fat and processed sugars, such as fried and sweet foods. Lifestyle  Lose weight if needed. Do not use any products that contain nicotine or tobacco. These include cigarettes, chewing tobacco, and vaping devices, such as e-cigarettes. If you need help quitting, ask your health care provider. General instructions Take over-the-counter and prescription medicines only as told by your health care provider. If you were prescribed an antibiotic medicine, take it as told by your health care provider. Do not stop taking the antibiotic even if you start to feel better. Use any implants or pessary as told by your health care provider. If needed, wear pads to absorb urine leakage. Keep a log to track how much and when you drink, and when you need to urinate. This will help your health care provider monitor your condition. Keep all follow-up visits. This is important. Contact a health care provider if: You have a fever or chills. Your symptoms do not get better with treatment. Your pain and discomfort get worse. You have more frequent urges to urinate. Get help right away if: You are not able to control your bladder. Summary Overactive bladder refers to a condition in which a person has a sudden and frequent need to urinate. Several conditions may lead to an overactive bladder. Treatment for overactive bladder depends on the cause and severity of your condition. Making lifestyle changes, doing Kegel exercises, keeping a log, and taking medicines can help with this condition. This information is not intended to replace advice given to you by your health care provider. Make sure you discuss any questions you have with your health care provider. Document Revised: 12/30/2019 Document Reviewed: 12/30/2019 Elsevier Patient Education  [employer].

## 2021-03-05 NOTE — Progress Notes (Signed)
03/05/2021 12:00 PM   Sarah [name] [birth year] [id number]  Referring provider: Cammie Sickle, MD [place],  [place] [id number]  Chief Complaint  Patient presents with   Hydronephrosis    New Patient    HPI: 70 year old female referred for further evaluation of hydronephrosis.  She is undergoing extensive work-up with Dr. Rogue Bussing for macrocytic anemia.  As part of this evaluation, she underwent a renal ultrasound with incidental finding of bilateral mild to moderate hydronephrosis.  Since then, she is undergone an MRI abdomen to evaluate a possible renal lesion.  This indicated that the hydronephrosis in question in fact likely represents extrarenal pelvis bilaterally.  Renal function is normal, creatinine 0.7.  She denies any flank pain or gross hematuria.  Urinalysis today is negative.  She does mention that she has a longstanding urinary frequency and occasional episodes of urge incontinence.  These are becoming increasingly bothersome.  Is been going on for at least 3 years.  It occurs at both daytime and nighttime.  She tries to watch her beverages in terms of volume secondary to this.  She is never tried medication.  She also leaks slightly when she laughs coughs and sneezes but this is less bothersome.   PMH: Past Medical History:  Diagnosis Date   Arthritis    Asthma    Depression    Dyspnea    with heat   Environmental and seasonal allergies    GERD (gastroesophageal reflux disease)    Presence of dental prosthetic device    Dental implants   Wheezing     Surgical History: Past Surgical History:  Procedure Laterality Date   ABDOMINAL HYSTERECTOMY  1989   BROW LIFT Bilateral 02/07/2020   Procedure: BLEPHAROPLASTY UPPER EYELID; W/EXCESS SKIN BROW PTOSIS REPAIR BILATERAL;  Surgeon: Karle Starch, MD;  Location: [place] [facility];  Service: Ophthalmology;  Laterality: Bilateral;   CATARACT EXTRACTION W/PHACO Right 09/13/2016    Procedure: CATARACT EXTRACTION PHACO AND INTRAOCULAR LENS PLACEMENT (IOC);  Surgeon: Birder Robson, MD;  Location: [facility];  Service: Ophthalmology;  Laterality: Right;  [place] 00:38 AP% 14.3 CDE 5.51 Fluid pack lot # [id number]   CATARACT EXTRACTION W/PHACO Left 10/11/2016   Procedure: CATARACT EXTRACTION PHACO AND INTRAOCULAR LENS PLACEMENT (IOC);  Surgeon: Birder Robson, MD;  Location: [facility];  Service: Ophthalmology;  Laterality: Left;  [place] 00:30 AP% 11.3 CDE 3.50 fluid pack lot # [id number]   COLONOSCOPY WITH PROPOFOL N/A 09/30/2014   Procedure: COLONOSCOPY WITH PROPOFOL;  Surgeon: Robert Bellow, MD;  Location: [facility];  Service: Endoscopy;  Laterality: N/A;   ESOPHAGOGASTRODUODENOSCOPY N/A 09/30/2014   Procedure: ESOPHAGOGASTRODUODENOSCOPY (EGD);  Surgeon: Robert Bellow, MD;  Location: [facility] END[facility];  Service: Endoscopy;  Laterality: N/A;   FOOT SURGERY Left 1985   KNEE SURGERY  2016    Home Medications:  Allergies as of 03/05/2021   No Known Allergies      Medication List        Accurate as of March 05, 2021 12:00 PM. If you have any questions, ask your nurse or doctor.          acetaminophen 325 MG tablet Commonly known as: Tylenol Take 2 tablets (650 mg total) by mouth 3 (three) times daily as needed. What changed: Another medication with the same name was removed. Continue taking this medication, and follow the directions you see here. Changed by: Hollice Espy, MD   albuterol 108 (90 Base) MCG/ACT inhaler Commonly known as: VENTOLIN HFA  Inhale 2 puffs into the lungs every 6 (six) hours as needed for wheezing or shortness of breath.   amLODipine 5 MG tablet Commonly known as: NORVASC Take 1 tablet (5 mg total) by mouth daily.   budesonide 0.25 MG/2ML nebulizer solution Commonly known as: PULMICORT Take 2 mLs (0.25 mg total) by nebulization 2 (two) times daily.   CALCIUM CITRATE + D PO Take 1 tablet by mouth 2 (two) times daily.    Cepacol Sore Throat 15-3.6 MG Lozg Generic drug: Benzocaine-Menthol Use as directed 1 lozenge in the mouth or throat 2 (two) times daily as needed.   citalopram 20 MG tablet Commonly known as: CELEXA Take 1 tablet (20 mg total) by mouth daily.   famotidine 20 MG tablet Commonly known as: PEPCID TAKE 1 TABLET BY MOUTH DAILY. BEFORE DINNER What changed:  how much to take how to take this when to take this additional instructions   fexofenadine 180 MG tablet Commonly known as: ALLEGRA Take 180 mg by mouth daily.   gabapentin 100 MG capsule Commonly known as: NEURONTIN TAKE 1 CAPSULE BY MOUTH 3 TIMES DAILY   ipratropium-albuterol 0.5-2.5 (3) MG/3ML Soln Commonly known as: DUONEB Take 3 mLs by nebulization every 6 (six) hours as needed.   levothyroxine 50 MCG tablet Commonly known as: SYNTHROID TAKE 1 TABLET EVERY DAY ON EMPTY STOMACHWITH A GLASS OF WATER AT LEAST 30-60 MINBEFORE BREAKFAST   mirabegron ER 50 MG Tb24 tablet Commonly known as: MYRBETRIQ Take 1 tablet (50 mg total) by mouth daily. Started by: Hollice Espy, MD   pantoprazole 40 MG tablet Commonly known as: PROTONIX TAKE ONE TABLET BY MOUTH EVERY MORNING 30 MIN BEFORE BREAKFAST   sodium chloride 0.65 % Soln nasal spray Commonly known as: OCEAN Place 2 sprays into both nostrils as needed for congestion.   traZODone 50 MG tablet Commonly known as: DESYREL Take 0.5-1 tablets (25-50 mg total) by mouth at bedtime as needed for sleep.   Trelegy Ellipta 100-62.5-25 MCG/ACT Aepb Generic drug: Fluticasone-Umeclidin-Vilant Inhale 1 puff into the lungs daily.   vitamin B-12 1000 MCG tablet Commonly known as: CYANOCOBALAMIN Take 1,000 mcg by mouth daily.        Allergies: No Known Allergies  Family History: Family History  Problem Relation Age of Onset   Diabetes Mother    Hyperlipidemia Mother    Heart disease Mother    Prostate cancer Neg Hx    Kidney cancer Neg Hx    Bladder Cancer Neg Hx      Social History:  reports that she has never smoked. She has never used smokeless tobacco. She reports that she does not drink alcohol and does not use drugs.   Physical Exam: BP 126/65   Pulse 81   Ht 5\' 3"  (1.6 m)   Wt 185 lb (83.9 kg)   BMI 32.77 kg/m   Constitutional:  Alert and oriented, No acute distress.  Accompanied today by a friend. HEENT: [place] AT, moist mucus membranes.  Trachea midline, no masses. Cardiovascular: No clubbing, cyanosis, or edema. Respiratory: Normal respiratory effort, no increased work of breathing. Neurologic: Grossly intact, no focal deficits, moving all 4 extremities. Psychiatric: Normal mood and affect.  Laboratory Data: Lab Results  Component Value Date   WBC 5.7 01/27/2021   HGB 12.1 01/27/2021   HCT 36.5 01/27/2021   MCV 104.0 (H) 01/27/2021   PLT 142 (L) 01/27/2021    Lab Results  Component Value Date   CREATININE 0.75 01/27/2021    Lab  Results  Component Value Date   HGBA1C 6.4 12/10/2020   IMPRESSION: 1. There are 3 small liver lesions, 2 of which represent simple cysts. 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 Repeat abdominal MRI with and without IV gadolinium (Eovist) is recommended in 3-6 months to ensure the stability of this lesion. None of these lesions appear suspicious for metastatic disease at this time. 2. Mild cardiomegaly. 3. Additional incidental findings, as above.     Electronically Signed   By: Vinnie Langton M.D.   On: 02/22/2021 08:57  The above MRI was personally reviewed along with renal ultrasound.  Agree this likely represents extrarenal pelvis.  Urinalysis Component     Latest Ref Rng & Units 03/05/2021  Color, Urine     YELLOW YELLOW  Appearance     CLEAR CLEAR  Specific Gravity, Urine     1.005 - 1.030 1.025  pH     5.0 - 8.0 6.5  Glucose, UA     NEGATIVE mg/dL NEGATIVE  Hgb urine dipstick     NEGATIVE  NEGATIVE  Bilirubin Urine     NEGATIVE NEGATIVE  Ketones, ur     NEGATIVE mg/dL NEGATIVE  Protein     NEGATIVE mg/dL NEGATIVE  Nitrite     NEGATIVE NEGATIVE  Leukocytes,Ua     NEGATIVE SMALL (A)  Squamous Epithelial / LPF     0 - 5 0-5  WBC, UA     0 - 5 WBC/hpf 0-5  RBC / HPF     0 - 5 RBC/hpf 0-5  Bacteria, UA     NONE SEEN NONE SEEN    Pertinent Imaging: Results for orders placed or performed in visit on 03/05/21  BLADDER SCAN AMB NON-IMAGING  Result Value Ref Range   Scan Result 12ml      Assessment & Plan:    1. Hydronephrosis, unspecified hydronephrosis type The above imaging was personally reviewed  And agree this likely represents a normal period, extrarenal pelvis bilaterally rather than true hydronephrosis.  Reassuringly today, urinalysis is negative, creatinine is normal, she is emptying her bladder without difficulty.  No further work-up or surveillance indicated at this time  2. Urinary incontinence, mixed Primarily bothered by urgency and urge incontinence  She is already implementing behavioral modification  She is interested in trying a medication to see if it helps.  She was given samples of Myrbetriq 50 mg x 6 weeks today.  This is her preferred drug on her formulary.  She will let us know if it is effective but if so, we will continue this medication.  She will need to be seen annually if she elects to take this medication. - BLADDER SCAN AMB NON-IMAGING   Return if symptoms worsen or fail to improve.  Hollice Espy, MD  [facility] [address], [place], [place] [address] [phone]

## 2021-03-08 ENCOUNTER — Ambulatory Visit (INDEPENDENT_AMBULATORY_CARE_PROVIDER_SITE_OTHER): Payer: Medicare Other | Admitting: Pharmacist

## 2021-03-08 ENCOUNTER — Telehealth: Payer: Self-pay | Admitting: Internal Medicine

## 2021-03-08 DIAGNOSIS — R7303 Prediabetes: Secondary | ICD-10-CM

## 2021-03-08 DIAGNOSIS — I10 Essential (primary) hypertension: Secondary | ICD-10-CM

## 2021-03-08 DIAGNOSIS — E785 Hyperlipidemia, unspecified: Secondary | ICD-10-CM

## 2021-03-08 DIAGNOSIS — J4540 Moderate persistent asthma, uncomplicated: Secondary | ICD-10-CM

## 2021-03-08 NOTE — Chronic Care Management (AMB) (Addendum)
Chronic Care Management CCM Pharmacy Note  03/08/2021 Name:  Sarah [name] MRN:  [id number] DOB:  [birth year]  Summary: - Doing well. Reviewed medications. Tolerating Trelegy well. - BP fairly well controlled at home - Due for PCP f/u  Recommendations/Changes made from today's visit: - Consider benefit vs risk of statin therapy (ASCVD risk 13%),  - Consider updated DEXA (last in 2016) given high FRAX at that time and periodic need for steroids since then  Subjective: Sarah [name] is an 70 y.o. year old female who is a primary patient of Derrel Nip, Aris Everts, MD.  The CCM team was consulted for assistance with disease management and care coordination needs.    Engaged with patient by telephone for follow up visit for pharmacy case management and/or care coordination services.   Objective:  Medications Reviewed Today     Reviewed by De Hollingshead, RPH-CPP (Pharmacist) on 03/08/21 at 1046  Med List Status: <None>   Medication Order Taking? Sig Documenting Provider Last Dose Status Informant  acetaminophen (TYLENOL) 325 MG tablet [id number] No Take 2 tablets (650 mg total) by mouth 3 (three) times daily as needed.  Patient not taking: Reported on 03/08/2021   McLean-Scocuzza, Nino Glow, MD Not Taking Active   albuterol (VENTOLIN HFA) 108 (90 Base) MCG/ACT inhaler [id number] Yes Inhale 2 puffs into the lungs every 6 (six) hours as needed for wheezing or shortness of breath. McLean-Scocuzza, Nino Glow, MD Taking Active   amLODipine (NORVASC) 5 MG tablet [id number] Yes Take 1 tablet (5 mg total) by mouth daily. Crecencio Mc, MD Taking Active   budesonide (PULMICORT) 0.25 MG/2ML nebulizer solution [id number] No Take 2 mLs (0.25 mg total) by nebulization 2 (two) times daily.  Patient not taking: Reported on 03/08/2021   McLean-Scocuzza, Nino Glow, MD Not Taking Active   Calcium Citrate-Vitamin D (CALCIUM CITRATE + D PO) [id number] Yes Take 1 tablet by mouth 2 (two) times daily. [provider] Taking Active   citalopram (CELEXA) 20 MG tablet [id number] Yes Take 1 tablet (20 mg total) by mouth daily. Crecencio Mc, MD Taking Active   famotidine (PEPCID) 20 MG tablet [id number] Yes TAKE 1 TABLET BY MOUTH DAILY. BEFORE DINNER  Patient taking differently: Take 20 mg by mouth daily.   Crecencio Mc, MD Taking Active   fexofenadine [facility]) 180 MG tablet [id number] Yes Take 180 mg by mouth daily. [provider] Taking Active Self  Fluticasone-Umeclidin-Vilant (TRELEGY ELLIPTA) 100-62.5-25 MCG/INH AEPB [id number] Yes Inhale 1 puff into the lungs daily. Crecencio Mc, MD Taking Active   gabapentin (NEURONTIN) 100 MG capsule [id number] Yes TAKE 1 CAPSULE BY MOUTH 3 TIMES DAILY Crecencio Mc, MD Taking Active            Med Note Mayo Ao Mar 08, 2021 10:43 AM) Taking BID  ipratropium-albuterol (DUONEB) 0.5-2.5 (3) MG/3ML SOLN [id number] No Take 3 mLs by nebulization every 6 (six) hours as needed.  Patient not taking: Reported on 03/08/2021   McLean-Scocuzza, Nino Glow, MD Not Taking Active   levothyroxine (SYNTHROID) 50 MCG tablet [id number] Yes TAKE 1 TABLET EVERY DAY ON EMPTY STOMACHWITH A GLASS OF WATER AT LEAST 30-60 MINBEFORE BREAKFAST Crecencio Mc, MD Taking Active   mirabegron ER (MYRBETRIQ) 50 MG TB24 tablet [id number] Yes Take 1 tablet (50 mg total) by mouth daily. Hollice Espy, MD Taking Active   pantoprazole (PROTONIX) 40 MG tablet [id number] Yes TAKE ONE TABLET BY MOUTH EVERY MORNING 30  MIN BEFORE BREAKFAST Crecencio Mc, MD Taking Active   traZODone (DESYREL) 50 MG tablet [id number] Yes Take 0.5-1 tablets (25-50 mg total) by mouth at bedtime as needed for sleep. Crecencio Mc, MD Taking Active   vitamin B-12 (CYANOCOBALAMIN) 1000 MCG tablet [id number] Yes Take 1,000 mcg by mouth daily. [provider] Taking Active             Pertinent Labs:   Lab Results  Component Value Date   HGBA1C 6.4 12/10/2020   Lab Results   Component Value Date   CHOL 221 (H) 06/12/2020   HDL 65.50 06/12/2020   LDLCALC 143 (H) 06/12/2020   TRIG 63.0 06/12/2020   CHOLHDL 3 06/12/2020   Lab Results  Component Value Date   CREATININE 0.75 01/27/2021   BUN 15 01/27/2021   NA 137 01/27/2021   K 4.0 01/27/2021   CL 101 01/27/2021   CO2 29 01/27/2021    SDOH:  (Social Determinants of Health) assessments and interventions performed:  SDOH Interventions    Flowsheet Row Most Recent Value  SDOH Interventions   Financial Strain Interventions Other (Comment)  [manufacturer assistance]       CCM Care Plan  Review of patient past medical history, allergies, medications, health status, including review of consultants reports, laboratory and other test data, was performed as part of comprehensive evaluation and provision of chronic care management services.   Care Plan : Medication Management  Updates made by De Hollingshead, RPH-CPP since 03/08/2021 12:00 AM     Problem: Asthma, Neuropathy, HTN      Long-Range Goal: Disease Progression Prevention   Start Date: 07/15/2020  Recent Progress: On track  Priority: High  Note:   Current Barriers:  Unable to independently afford treatment regimen  Pharmacist Clinical Goal(s):  Over the next 90 days, patient will verbalize ability to afford treatment regimen Over the next 90 days, patient will achieve adherence to monitoring guidelines and medication adherence to achieve therapeutic efficacy through collaboration with PharmD and provider.   Interventions: 1:1 collaboration with Crecencio Mc, MD regarding development and update of comprehensive plan of care as evidenced by provider attestation and co-signature Inter-disciplinary care team collaboration (see longitudinal plan of care) Comprehensive medication review performed; medication list updated in electronic medical record  Health Maintenance   Yearly influenza vaccination: up to date - she will double check  the date and call us back Td/Tdap vaccination: up to date Pneumonia vaccination: up to date COVID vaccinations: up to date - she will double check the date and call us back Shingrix vaccinations: up to date Colonoscopy: up to date - one scheduled later this month w/ EGD Bone density scan: up to date Mammogram: up to date  Hypertension: Controlled per last in office reading; current treatment: amlodipine 5 mg daily Current home readings: generally <130/80, occasional elevations. Though using same cuff that she brought for comparison at BP visit as she has not been able to purchase a new one. She is investigating different insurance plans, we discussed that some offer OTC benefits to assist in purchasing BP machines.  Recommended to continue current regimen at this time  Hyperlipidemia: Uncontrolled; current treatment: none  Medications previously tried: none 10 year ASCVD risk score: 13% Consider benefit vs risk of statin therapy moving forward. CAC scoring would be beneficial, but unlikely that she would be able to afford out of pocket cost.   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 She is investigating other insurance plans and will take this into consideration. Discussed [employer]  Depression Controlled per patient report; current treatment: citalopram 20 mg daily Previously recommended to continue current regimen at this time  GERD: Controlled per patient report; pantoprazole 40 mg QAM, famotidine 20 mg QPM  Upcoming colonoscopy and EGD.  Previously recommended to continue current regimen at this time, avoid trigger foods. Continue to collaborate with GI.    Osteopenia: Current treatment: calcium citrate 600 mg + vitamin D 2 tablets daily. No regular dairy sources Last DEXA 09/2014: t score -2.2 at spine, -2.0 at femur; FRAX risk of major osteoporotic fracture 17.2%, hip fracture 2.7% Previously recommended to continue current regimen at this time. Moving forward, consider updated DEXA, especially given need for periodic steroid use for asthma.  Neuropathy/Osteoarthritis Pain: Improved per patient report; current regimen: gabapentin 100 mg TID, though often just taking BID, taking acetaminophen 500 mg PRN (has switched away from ibuprofen) Previously recommended to continue current regimen at this time   Hypothyroidism: Controlled per last lab work, current regimen: levothyroxine 50 mcg daily Previously recommended to continue current regimen at this time  Supplements: Vitamin B12 1000 mcg daily, calcium citrate 600 mg + vitamin D as above   Patient Goals/Self-Care Activities Over the next 90 days, patient will:  - take medications as prescribed collaborate with provider on medication access solutions       Plan: Telephone follow up appointment with care management team member scheduled for:  3 months  [name], PharmD, [place], CPP Clinical Pharmacist [employer] at [facility] [phone]   I have reviewed the above information and agree with above.   Deborra Medina, MD

## 2021-03-08 NOTE — Telephone Encounter (Signed)
Patient called to inform you that she had her Covid vaccine 1st - did not know the date        Covid vaccine 2nd- did not know the date        Booster 1- 02/05/21 was the last booster       No Flu Shot       Pneumonia vaccine 11/09/20  Patient would like a call from you.

## 2021-03-08 NOTE — Telephone Encounter (Signed)
Added COVID booster to patient's chart. She plans to call and schedule an influenza vaccine at [facility], and will let us know when this is received.

## 2021-03-08 NOTE — Patient Instructions (Signed)
Sarah [name],   It was great talking to you today!  Our goal blood pressure is less than 130/80. Continue checking periodically at home. Consider purchasing a new cuff, since it appeared yours was not accurate when you brought it to compare.   If you haven't already, check with [facility] and let us know when you received your influenza vaccine and your updated COVID booster.   I'll talk to Dr. Derrel Nip and see when she wants you in for follow up.   Take care! [name], PharmD  Visit Information  Patient Goals/Self-Care Activities Over the next 90 days, patient will:  - take medications as prescribed collaborate with provider on medication access solutions  Print copy of patient instructions, educational materials, and care plan provided in person.   Plan: Telephone follow up appointment with care management team member scheduled for:  3 months  [name], PharmD, [place], [place] Clinical Pharmacist [employer] [facility] [employer] [phone]

## 2021-03-12 ENCOUNTER — Ambulatory Visit: Payer: Medicare Other | Admitting: Internal Medicine

## 2021-03-24 ENCOUNTER — Other Ambulatory Visit: Payer: Self-pay

## 2021-03-24 ENCOUNTER — Encounter
Admission: RE | Disposition: A | Discharge status: Home / Self Care | Payer: Self-pay | Source: Home / Self Care | Attending: Gastroenterology

## 2021-03-24 ENCOUNTER — Ambulatory Visit: Payer: Medicare Other | Admitting: Registered Nurse

## 2021-03-24 ENCOUNTER — Encounter: Payer: Self-pay | Admitting: Gastroenterology

## 2021-03-24 ENCOUNTER — Ambulatory Visit
Admission: RE | Admit: 2021-03-24 | Discharge: 2021-03-24 | Disposition: A | Discharge status: Home / Self Care | Payer: Medicare Other | Attending: Gastroenterology | Admitting: Gastroenterology

## 2021-03-24 DIAGNOSIS — K635 Polyp of colon: Secondary | ICD-10-CM

## 2021-03-24 DIAGNOSIS — E785 Hyperlipidemia, unspecified: Secondary | ICD-10-CM | POA: Diagnosis not present

## 2021-03-24 DIAGNOSIS — J4540 Moderate persistent asthma, uncomplicated: Secondary | ICD-10-CM

## 2021-03-24 DIAGNOSIS — R195 Other fecal abnormalities: Secondary | ICD-10-CM | POA: Insufficient documentation

## 2021-03-24 DIAGNOSIS — K644 Residual hemorrhoidal skin tags: Secondary | ICD-10-CM | POA: Diagnosis not present

## 2021-03-24 DIAGNOSIS — G629 Polyneuropathy, unspecified: Secondary | ICD-10-CM | POA: Diagnosis not present

## 2021-03-24 DIAGNOSIS — D123 Benign neoplasm of transverse colon: Secondary | ICD-10-CM

## 2021-03-24 DIAGNOSIS — D120 Benign neoplasm of cecum: Secondary | ICD-10-CM | POA: Insufficient documentation

## 2021-03-24 DIAGNOSIS — Z13810 Encounter for screening for upper gastrointestinal disorder: Secondary | ICD-10-CM | POA: Diagnosis not present

## 2021-03-24 DIAGNOSIS — I10 Essential (primary) hypertension: Secondary | ICD-10-CM

## 2021-03-24 DIAGNOSIS — D122 Benign neoplasm of ascending colon: Secondary | ICD-10-CM | POA: Diagnosis not present

## 2021-03-24 DIAGNOSIS — K317 Polyp of stomach and duodenum: Secondary | ICD-10-CM | POA: Insufficient documentation

## 2021-03-24 DIAGNOSIS — K219 Gastro-esophageal reflux disease without esophagitis: Secondary | ICD-10-CM | POA: Diagnosis not present

## 2021-03-24 DIAGNOSIS — K449 Diaphragmatic hernia without obstruction or gangrene: Secondary | ICD-10-CM | POA: Diagnosis not present

## 2021-03-24 HISTORY — PX: COLONOSCOPY WITH PROPOFOL: SHX5780

## 2021-03-24 HISTORY — PX: ESOPHAGOGASTRODUODENOSCOPY: SHX5428

## 2021-03-24 HISTORY — DX: Essential (primary) hypertension: I10

## 2021-03-24 SURGERY — COLONOSCOPY WITH PROPOFOL
Anesthesia: General

## 2021-03-24 MED ORDER — DEXMEDETOMIDINE HCL 200 MCG/2ML IV SOLN
INTRAVENOUS | Status: DC | PRN
  Administered 2021-03-24: 12 ug via INTRAVENOUS

## 2021-03-24 MED ORDER — LIDOCAINE HCL (CARDIAC) PF 100 MG/5ML IV SOSY
PREFILLED_SYRINGE | INTRAVENOUS | Status: DC | PRN
  Administered 2021-03-24: 100 mg via INTRAVENOUS

## 2021-03-24 MED ORDER — PROPOFOL 10 MG/ML IV BOLUS
INTRAVENOUS | Status: DC | PRN
  Administered 2021-03-24: 70 mg via INTRAVENOUS
  Administered 2021-03-24: 20 mg via INTRAVENOUS

## 2021-03-24 MED ORDER — LIDOCAINE HCL URETHRAL/MUCOSAL 2 % EX GEL
CUTANEOUS | Status: AC
  Filled 2021-03-24: qty 5

## 2021-03-24 MED ORDER — SODIUM CHLORIDE 0.9 % IV SOLN: INTRAVENOUS | Status: DC

## 2021-03-24 MED ORDER — PROPOFOL 500 MG/50ML IV EMUL
INTRAVENOUS | Status: DC | PRN
  Administered 2021-03-24: 150 ug/kg/min via INTRAVENOUS

## 2021-03-24 NOTE — Op Note (Signed)
[facility] Patient Name: Sarah [name] Procedure Date: 03/24/2021 10:46 AM MRN: [id number] Account #: [id number] Date of Birth: 04-25-1951 Admit Type: Outpatient Age: 70 Room: [facility] ENDO ROOM 4 Gender: Female Note Status: Finalized Instrument Name: Upper Endoscope [id number] Procedure:             Upper GI endoscopy Indications:           Screening for Barrett's esophagus, Follow-up of                         gastro-esophageal reflux disease Providers:             Lin Landsman MD, MD Medicines:             General Anesthesia Complications:         No immediate complications. Estimated blood loss: None. Procedure:             Pre-Anesthesia Assessment:                        - Prior to the procedure, a History and Physical was                         performed, and patient medications and allergies were                         reviewed. The patient is competent. The risks and                         benefits of the procedure and the sedation options and                         risks were discussed with the patient. All questions                         were answered and informed consent was obtained.                         Patient identification and proposed procedure were                         verified by the physician, the nurse, the                         anesthesiologist, the anesthetist and the technician                         in the pre-procedure area in the procedure room in the                         endoscopy suite. Mental Status Examination: alert and                         oriented. Airway Examination: normal oropharyngeal                         airway and neck mobility. Respiratory Examination:                         clear to  auscultation. CV Examination: normal.                         Prophylactic Antibiotics: The patient does not require                         prophylactic antibiotics. Prior Anticoagulants: The                          patient has taken no previous anticoagulant or                         antiplatelet agents. ASA Grade Assessment: III - A                         patient with severe systemic disease. After reviewing                         the risks and benefits, the patient was deemed in                         satisfactory condition to undergo the procedure. The                         anesthesia plan was to use general anesthesia.                         Immediately prior to administration of medications,                         the patient was re-assessed for adequacy to receive                         sedatives. The heart rate, respiratory rate, oxygen                         saturations, blood pressure, adequacy of pulmonary                         ventilation, and response to care were monitored                         throughout the procedure. The physical status of the                         patient was re-assessed after the procedure.                        After obtaining informed consent, the endoscope was                         passed under direct vision. Throughout the procedure,                         the patient's blood pressure, pulse, and oxygen                         saturations were monitored continuously. The  Endosonoscope was introduced through the mouth, and                         advanced to the second part of duodenum. The upper GI                         endoscopy was accomplished without difficulty. The                         patient tolerated the procedure well. Findings:      The duodenal bulb and second portion of the duodenum were normal.      Multiple sessile polyps with no bleeding and no stigmata of recent       bleeding were found in the gastric fundus and in the gastric body       consistent with fundic gland polyps.      A small hiatal hernia was present.      Esophagogastric landmarks were identified: the gastroesophageal  junction       was found at 36 cm from the incisors.      The gastroesophageal junction and examined esophagus were normal. Impression:            - Normal duodenal bulb and second portion of the                         duodenum.                        - Multiple gastric polyps.                        - Small hiatal hernia.                        - Esophagogastric landmarks identified.                        - Normal gastroesophageal junction and esophagus.                        - No specimens collected. Recommendation:        - Discharge patient to home (with escort).                        - Resume previous diet today.                        - Continue present medications.                        - Follow an antireflux regimen for the rest of the                         patient's life. Procedure Code(s):     --- Professional ---                        [id number], Esophagogastroduodenoscopy, flexible,                         transoral; diagnostic, including collection of  specimen(s) by brushing or washing, when performed                         (separate procedure) Diagnosis Code(s):     --- Professional ---                        K31.7, Polyp of stomach and duodenum                        K44.9, Diaphragmatic hernia without obstruction or                         gangrene                        Z13.810, Encounter for screening for upper                         gastrointestinal disorder                        K21.9, Gastro-esophageal reflux disease without                         esophagitis CPT copyright 2019 American Medical Association. All rights reserved. The codes documented in this report are preliminary and upon coder review may  be revised to meet current compliance requirements. Dr. Ulyess Mort Lin Landsman MD, MD 03/24/2021 11:10:09 AM This report has been signed electronically. Number of Addenda: 0 Note Initiated On: 03/24/2021 10:46  AM Estimated Blood Loss:  Estimated blood loss: none.      [facility]

## 2021-03-24 NOTE — Op Note (Signed)
[facility] Patient Name: Sarah [name] Procedure Date: 03/24/2021 10:45 AM MRN: [id number] Account #: [id number] Date of Birth: May 26, 1950 Admit Type: Outpatient Age: 70 Room: [facility] ENDO ROOM 4 Gender: Female Note Status: Finalized Instrument Name: Colonoscope [id number] Procedure:             Colonoscopy Indications:           Last colonoscopy: June 2016, Positive fecal                         immunochemical test Providers:             Lin Landsman MD, MD Medicines:             General Anesthesia Complications:         No immediate complications. Estimated blood loss: None. Procedure:             Pre-Anesthesia Assessment:                        - Prior to the procedure, a History and Physical was                         performed, and patient medications and allergies were                         reviewed. The patient is competent. The risks and                         benefits of the procedure and the sedation options and                         risks were discussed with the patient. All questions                         were answered and informed consent was obtained.                         Patient identification and proposed procedure were                         verified by the physician, the nurse, the                         anesthesiologist, the anesthetist and the technician                         in the pre-procedure area in the procedure room in the                         endoscopy suite. Mental Status Examination: alert and                         oriented. Airway Examination: normal oropharyngeal                         airway and neck mobility. Respiratory Examination:                         clear to auscultation. CV Examination: normal.  Prophylactic Antibiotics: The patient does not require                         prophylactic antibiotics. Prior Anticoagulants: The                         patient has  taken no previous anticoagulant or                         antiplatelet agents. ASA Grade Assessment: III - A                         patient with severe systemic disease. After reviewing                         the risks and benefits, the patient was deemed in                         satisfactory condition to undergo the procedure. The                         anesthesia plan was to use general anesthesia.                         Immediately prior to administration of medications,                         the patient was re-assessed for adequacy to receive                         sedatives. The heart rate, respiratory rate, oxygen                         saturations, blood pressure, adequacy of pulmonary                         ventilation, and response to care were monitored                         throughout the procedure. The physical status of the                         patient was re-assessed after the procedure.                        After obtaining informed consent, the colonoscope was                         passed under direct vision. Throughout the procedure,                         the patient's blood pressure, pulse, and oxygen                         saturations were monitored continuously. The                         Colonoscope was introduced through the anus and  advanced to the the cecum, identified by appendiceal                         orifice and ileocecal valve. The colonoscopy was                         performed with moderate difficulty due to significant                         looping. Successful completion of the procedure was                         aided by applying abdominal pressure. The patient                         tolerated the procedure well. The quality of the bowel                         preparation was evaluated using the BBPS [facility] Bowel                         Preparation Scale) with scores of: Right Colon = 3,                          Transverse Colon = 3 and Left Colon = 3 (entire mucosa                         seen well with no residual staining, small fragments                         of stool or opaque liquid). The total BBPS score                         equals 9. Findings:      Skin tags were found on perianal exam.      A 8 mm polyp was found in the cecum. The polyp was sessile. The polyp       was removed with a cold snare. Resection and retrieval were complete. To       prevent bleeding after the polypectomy, one hemostatic clip was       successfully placed (MR conditional). There was no bleeding at the end       of the procedure.      A 3 mm polyp was found in the ileocecal valve. The polyp was sessile.       The polyp was removed with a cold snare. Resection and retrieval were       complete.      A 5 mm polyp was found in the ascending colon. The polyp was sessile.       The polyp was removed with a cold snare. Resection and retrieval were       complete.      A 20 mm polyp was found in the proximal transverse colon. The polyp was       flat. Preparations were made for mucosal resection. Eleview was injected       with adequate lift of the lesion from the muscularis propria. Snare       mucosal resection with Jabier Mutton net  retrieval was performed. A 20 mm area       was resected. Resection and retrieval were complete. There was no       bleeding during and at the end of the procedure. To prevent bleeding       after mucosal resection, two hemostatic clips were successfully placed       (MR conditional). There was no bleeding during, or at the end, of the       procedure.      Non-bleeding external hemorrhoids were found during retroflexion. The       hemorrhoids were medium-sized. Impression:            - Perianal skin tags found on perianal exam.                        - One 8 mm polyp in the cecum, removed with a cold                         snare. Resected and retrieved. Clip (MR conditional)                          was placed.                        - One 3 mm polyp at the ileocecal valve, removed with                         a cold snare. Resected and retrieved.                        - One 5 mm polyp in the ascending colon, removed with                         a cold snare. Resected and retrieved.                        - One 20 mm polyp in the proximal transverse colon,                         removed with mucosal resection. Resected and                         retrieved. Clips (MR conditional) were placed.                        - Non-bleeding external hemorrhoids.                        - Mucosal resection was performed. Resection and                         retrieval were complete. Recommendation:        - Discharge patient to home (with escort).                        - Resume previous diet today.                        - Continue present medications.                        -  Await pathology results.                        - Repeat colonoscopy in 3 years for surveillance of                         multiple polyps.                        - Return to my office as previously scheduled. Procedure Code(s):     --- Professional ---                        [phone], Colonoscopy, flexible; with endoscopic mucosal                         resection                        [id number], 69, Colonoscopy, flexible; with removal of                         tumor(s), polyp(s), or other lesion(s) by snare                         technique Diagnosis Code(s):     --- Professional ---                        K63.5, Polyp of colon                        K64.4, Residual hemorrhoidal skin tags                        R19.5, Other fecal abnormalities CPT copyright 2019 American Medical Association. All rights reserved. The codes documented in this report are preliminary and upon coder review may  be revised to meet current compliance requirements. Dr. Ulyess Mort Lin Landsman MD, MD 03/24/2021  11:53:56 AM This report has been signed electronically. Number of Addenda: 0 Note Initiated On: 03/24/2021 10:45 AM Scope Withdrawal Time: 0 hours 34 minutes 3 seconds  Total Procedure Duration: 0 hours 36 minutes 23 seconds  Estimated Blood Loss:  Estimated blood loss: none.      [facility]

## 2021-03-24 NOTE — H&P (Signed)
Cephas Darby, MD [address]  [place]  [place],  [address]  Main: [phone]  Fax: [phone] Pager: [phone]  Primary Care Physician:  Crecencio Mc, MD Primary Gastroenterologist:  Dr. Cephas Darby  Pre-Procedure History & Physical: HPI:  Sarah [name] is a 70 y.o. female is here for an endoscopy and colonoscopy.   Past Medical History:  Diagnosis Date   Arthritis    Asthma    Depression    Dyspnea    with heat   Environmental and seasonal allergies    GERD (gastroesophageal reflux disease)    Hypertension    Presence of dental prosthetic device    Dental implants   Wheezing     Past Surgical History:  Procedure Laterality Date   ABDOMINAL HYSTERECTOMY  1989   BROW LIFT Bilateral 02/07/2020   Procedure: BLEPHAROPLASTY UPPER EYELID; W/EXCESS SKIN BROW PTOSIS REPAIR BILATERAL;  Surgeon: Karle Starch, MD;  Location: [place];  Service: Ophthalmology;  Laterality: Bilateral;   CATARACT EXTRACTION W/PHACO Right 09/13/2016   Procedure: CATARACT EXTRACTION PHACO AND INTRAOCULAR LENS PLACEMENT (IOC);  Surgeon: Birder Robson, MD;  Location: [facility];  Service: Ophthalmology;  Laterality: Right;  [place] 00:38 AP% 14.3 CDE 5.51 Fluid pack lot # [id number]   CATARACT EXTRACTION W/PHACO Left 10/11/2016   Procedure: CATARACT EXTRACTION PHACO AND INTRAOCULAR LENS PLACEMENT (IOC);  Surgeon: Birder Robson, MD;  Location: [facility];  Service: Ophthalmology;  Laterality: Left;  [place] 00:30 AP% 11.3 CDE 3.50 fluid pack lot # [id number]   COLONOSCOPY WITH PROPOFOL N/A 09/30/2014   Procedure: COLONOSCOPY WITH PROPOFOL;  Surgeon: Robert Bellow, MD;  Location: [facility] ENDOSCOPY;  Service: Endoscopy;  Laterality: N/A;   ESOPHAGOGASTRODUODENOSCOPY N/A 09/30/2014   Procedure: ESOPHAGOGASTRODUODENOSCOPY (EGD);  Surgeon: Robert Bellow, MD;  Location: [facility] END[facility]COPY;  Service: Endoscopy;  Laterality: N/A;   FOOT SURGERY Left 1985   KNEE SURGERY  2016     Prior to Admission medications   Medication Sig Start Date End Date Taking? Authorizing Provider  acetaminophen (TYLENOL) 325 MG tablet Take 2 tablets (650 mg total) by mouth 3 (three) times daily as needed. Patient not taking: Reported on 03/08/2021 11/26/20   McLean-Scocuzza, Nino Glow, MD  albuterol (VENTOLIN HFA) 108 (90 Base) MCG/ACT inhaler Inhale 2 puffs into the lungs every 6 (six) hours as needed for wheezing or shortness of breath. 11/26/20   McLean-Scocuzza, Nino Glow, MD  amLODipine (NORVASC) 5 MG tablet Take 1 tablet (5 mg total) by mouth daily. 01/19/21   Crecencio Mc, MD  budesonide (PULMICORT) 0.25 MG/2ML nebulizer solution Take 2 mLs (0.25 mg total) by nebulization 2 (two) times daily. Patient not taking: Reported on 03/08/2021 11/26/20   McLean-Scocuzza, Nino Glow, MD  Calcium Citrate-Vitamin D (CALCIUM CITRATE + D PO) Take 1 tablet by mouth 2 (two) times daily.    [provider]  citalopram (CELEXA) 20 MG tablet Take 1 tablet (20 mg total) by mouth daily. 10/07/20   Crecencio Mc, MD  famotidine (PEPCID) 20 MG tablet TAKE 1 TABLET BY MOUTH DAILY. BEFORE DINNER Patient taking differently: Take 20 mg by mouth daily. 07/28/17   Crecencio Mc, MD  fexofenadine (ALLEGRA) 180 MG tablet Take 180 mg by mouth daily.    [provider]  Fluticasone-Umeclidin-Vilant (TRELEGY ELLIPTA) 100-62.5-25 MCG/INH AEPB Inhale 1 puff into the lungs daily. 01/19/21   Crecencio Mc, MD  gabapentin (NEURONTIN) 100 MG capsule TAKE 1 CAPSULE BY MOUTH 3 TIMES DAILY  02/18/21   Crecencio Mc, MD  ipratropium-albuterol (DUONEB) 0.5-2.5 (3) MG/3ML SOLN Take 3 mLs by nebulization every 6 (six) hours as needed. Patient not taking: Reported on 03/08/2021 11/26/20   McLean-Scocuzza, Nino Glow, MD  levothyroxine (SYNTHROID) 50 MCG tablet TAKE 1 TABLET EVERY DAY ON EMPTY STOMACHWITH A GLASS OF WATER AT LEAST 30-60 MINBEFORE BREAKFAST 01/27/21   Crecencio Mc, MD  mirabegron ER (MYRBETRIQ) 50 MG TB24  tablet Take 1 tablet (50 mg total) by mouth daily. 03/05/21   Hollice Espy, MD  pantoprazole (PROTONIX) 40 MG tablet TAKE ONE TABLET BY MOUTH EVERY MORNING 30 MIN BEFORE BREAKFAST 08/11/20   Crecencio Mc, MD  traZODone (DESYREL) 50 MG tablet Take 0.5-1 tablets (25-50 mg total) by mouth at bedtime as needed for sleep. 12/10/20   Crecencio Mc, MD  vitamin B-12 (CYANOCOBALAMIN) 1000 MCG tablet Take 1,000 mcg by mouth daily.    [provider]    Allergies as of 02/23/2021   (No Known Allergies)    Family History  Problem Relation Age of Onset   Diabetes Mother    Hyperlipidemia Mother    Heart disease Mother    Prostate cancer Neg Hx    Kidney cancer Neg Hx    Bladder Cancer Neg Hx     Social History   Socioeconomic History   Marital status: Single    Spouse name: Not on file   Number of children: Not on file   Years of education: Not on file   Highest education level: Not on file  Occupational History   Not on file  Tobacco Use   Smoking status: Never   Smokeless tobacco: Never  Vaping Use   Vaping Use: Never used  Substance and Sexual Activity   Alcohol use: No   Drug use: No   Sexual activity: Never  Other Topics Concern   Not on file  Social History Narrative   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 No smoking; no alcohol. Lives in [place], [place] lives self/kitty.    Social Determinants of Health   Financial Resource Strain: Medium Risk   Difficulty of Paying Living Expenses: Somewhat hard  Food Insecurity: No Food Insecurity   Worried About Charity fundraiser in the Last Year: Never true   Ran Out of Food in the Last Year: Never true  Transportation Needs: No Transportation Needs   Lack of Transportation (Medical): No   Lack of Transportation (Non-Medical): No  Physical Activity: Unknown   Days of Exercise per Week: 0 days   Minutes of Exercise per Session: Not on file  Stress: No Stress Concern Present   Feeling of Stress : Not at all   Social Connections: Unknown   Frequency of Communication with Friends and Family: More than three times a week   Frequency of Social Gatherings with Friends and Family: More than three times a week   Attends Religious Services: More than 4 times per year   Active Member of Clubs or Organizations: Yes   Attends Music therapist: More than 4 times per year   Marital Status: Not on file  Intimate Partner Violence: Not At Risk   Fear of Current or Ex-Partner: No   Emotionally Abused: No   Physically Abused: No   Sexually Abused: No    Review of Systems: See HPI, otherwise negative ROS  Physical Exam: BP 119/77   Pulse 79   Temp 98.2 F (36.8 C) (Temporal)   Resp 20  Ht 5\' 3"  (1.6 m)   Wt 184 lb (83.5 kg)   SpO2 100%   BMI 32.59 kg/m  General:   Alert,  pleasant and cooperative in NAD Head:  Normocephalic and atraumatic. Neck:  Supple; no masses or thyromegaly. Lungs:  Clear throughout to auscultation.    Heart:  Regular rate and rhythm. Abdomen:  Soft, nontender and nondistended. Normal bowel sounds, without guarding, and without rebound.   Neurologic:  Alert and  oriented x4;  grossly normal neurologically.  Impression/Plan: TANGEE MARSZALEK is here for an endoscopy and colonoscopy to be performed for chronic GERD and FOBTpositive  Risks, benefits, limitations, and alternatives regarding  endoscopy and colonoscopy have been reviewed with the patient.  Questions have been answered.  All parties agreeable.   Sherri Sear, MD  03/24/2021, 1:32 PM

## 2021-03-24 NOTE — Anesthesia Postprocedure Evaluation (Signed)
Anesthesia Post Note  Patient: Sarah [name]  Procedure(s) Performed: COLONOSCOPY WITH PROPOFOL ESOPHAGOGASTRODUODENOSCOPY (EGD)  Patient location during evaluation: PACU Anesthesia Type: General Level of consciousness: awake and awake and alert Pain management: pain level controlled Vital Signs Assessment: post-procedure vital signs reviewed and stable Respiratory status: spontaneous breathing and nonlabored ventilation Cardiovascular status: blood pressure returned to baseline Anesthetic complications: no   No notable events documented.   Last Vitals:  Vitals:   03/24/21 1043 03/24/21 1158  BP: 135/71 119/77  Pulse: 79   Resp: 20   Temp: 36.6 C 36.8 C  SpO2: 100%     Last Pain:  Vitals:   03/24/21 1208  TempSrc:   PainSc: 0-No pain                 VAN STAVEREN,Teigan Sahli

## 2021-03-24 NOTE — Transfer of Care (Signed)
Immediate Anesthesia Transfer of Care Note  Patient: Sarah [name]  Procedure(s) Performed: COLONOSCOPY WITH PROPOFOL ESOPHAGOGASTRODUODENOSCOPY (EGD)  Patient Location: Endoscopy Unit  Anesthesia Type:General  Level of Consciousness: drowsy  Airway & Oxygen Therapy: Patient Spontanous Breathing  Post-op Assessment: Report given to RN and Post -op Vital signs reviewed and stable  Post vital signs: Reviewed and stable  Last Vitals:  Vitals Value Taken Time  BP    Temp    Pulse 79 03/24/21 1157  Resp 24 03/24/21 1157  SpO2 98 % 03/24/21 1157  Vitals shown include unvalidated device data.  Last Pain:  Vitals:   03/24/21 1043  TempSrc: Temporal  PainSc: 0-No pain         Complications: No notable events documented.

## 2021-03-24 NOTE — Anesthesia Preprocedure Evaluation (Signed)
Anesthesia Evaluation  Patient identified by MRN, date of birth, ID band Patient awake    Reviewed: Allergy & Precautions, NPO status , Patient's Chart, lab work & pertinent test results  Airway Mallampati: II  TM Distance: >3 FB Neck ROM: full    Dental  (+) Teeth Intact   Pulmonary neg pulmonary ROS, shortness of breath and with exertion,    Pulmonary exam normal  + decreased breath sounds      Cardiovascular Exercise Tolerance: Good hypertension, Pt. on medications negative cardio ROS Normal cardiovascular exam Rhythm:Regular     Neuro/Psych Depression negative neurological ROS  negative psych ROS   GI/Hepatic negative GI ROS, Neg liver ROS, GERD  ,  Endo/Other  negative endocrine ROSHypothyroidism   Renal/GU negative Renal ROS  negative genitourinary   Musculoskeletal   Abdominal Normal abdominal exam  (+)   Peds negative pediatric ROS (+)  Hematology negative hematology ROS (+) Blood dyscrasia, anemia ,   Anesthesia Other Findings Past Medical History: No date: Arthritis No date: Asthma No date: Depression No date: Dyspnea     Comment:  with heat No date: Environmental and seasonal allergies No date: GERD (gastroesophageal reflux disease) No date: Hypertension No date: Presence of dental prosthetic device     Comment:  Dental implants No date: Wheezing  Past Surgical History: 1989: ABDOMINAL HYSTERECTOMY 02/07/2020: BROW LIFT; Bilateral     Comment:  Procedure: BLEPHAROPLASTY UPPER EYELID; W/EXCESS SKIN               BROW PTOSIS REPAIR BILATERAL;  Surgeon: Karle Starch,               MD;  Location: [facility];  Service:               Ophthalmology;  Laterality: Bilateral; 09/13/2016: CATARACT EXTRACTION W/PHACO; Right     Comment:  Procedure: CATARACT EXTRACTION PHACO AND INTRAOCULAR               LENS PLACEMENT ([place]);  Surgeon: Birder Robson, MD;                Location: [facility];   Service: Ophthalmology;  Laterality:              Right;  [place] 00:38 AP% 14.3 CDE 5.51 Fluid pack lot #               [id number] 10/11/2016: CATARACT EXTRACTION W/PHACO; Left     Comment:  Procedure: CATARACT EXTRACTION PHACO AND INTRAOCULAR               LENS PLACEMENT (IOC);  Surgeon: Birder Robson, MD;                Location: [facility];  Service: Ophthalmology;  Laterality:              Left;  [place] 00:30 AP% 11.3 CDE 3.50 fluid pack lot #               [id number] 09/30/2014: COLONOSCOPY WITH PROPOFOL; N/A     Comment:  Procedure: COLONOSCOPY WITH PROPOFOL;  Surgeon: Robert Bellow, MD;  Location: [facility];  Service:               Endoscopy;  Laterality: N/A; 09/30/2014: ESOPHAGOGASTRODUODENOSCOPY; N/A     Comment:  Procedure: ESOPHAGOGASTRODUODENOSCOPY (EGD);  Surgeon:  Robert Bellow, MD;  Location: [facility] ENDOSCOPY;                Service: Endoscopy;  Laterality: N/A; 1985: FOOT SURGERY; Left 2016: KNEE SURGERY     Reproductive/Obstetrics negative OB ROS                             Anesthesia Physical Anesthesia Plan  ASA: 3  Anesthesia Plan: General   Post-op Pain Management:    Induction: Intravenous  PONV Risk Score and Plan: Propofol infusion and TIVA  Airway Management Planned: Nasal Cannula and Natural Airway  Additional Equipment:   Intra-op Plan:   Post-operative Plan:   Informed Consent: I have reviewed the patients History and Physical, chart, labs and discussed the procedure including the risks, benefits and alternatives for the proposed anesthesia with the patient or authorized representative who has indicated his/her understanding and acceptance.     Dental Advisory Given  Plan Discussed with: CRNA and Surgeon  Anesthesia Plan Comments:         Anesthesia Quick Evaluation

## 2021-03-25 ENCOUNTER — Encounter: Payer: Self-pay | Admitting: Gastroenterology

## 2021-03-25 LAB — SURGICAL PATHOLOGY

## 2021-04-06 ENCOUNTER — Encounter: Payer: Self-pay | Admitting: Family

## 2021-04-06 ENCOUNTER — Ambulatory Visit (INDEPENDENT_AMBULATORY_CARE_PROVIDER_SITE_OTHER): Payer: Medicare Other | Admitting: Family

## 2021-04-06 ENCOUNTER — Telehealth: Payer: Self-pay | Admitting: Internal Medicine

## 2021-04-06 ENCOUNTER — Other Ambulatory Visit: Payer: Self-pay

## 2021-04-06 ENCOUNTER — Ambulatory Visit (INDEPENDENT_AMBULATORY_CARE_PROVIDER_SITE_OTHER): Payer: Medicare Other

## 2021-04-06 VITALS — BP 150/70 | HR 81 | Temp 98.9°F | Ht 63.0 in | Wt 184.2 lb

## 2021-04-06 DIAGNOSIS — M542 Cervicalgia: Secondary | ICD-10-CM | POA: Diagnosis not present

## 2021-04-06 MED ORDER — KETOROLAC TROMETHAMINE 60 MG/2ML IM SOLN
60.0000 mg | Freq: Once | INTRAMUSCULAR | Status: AC
  Administered 2021-04-06: 60 mg via INTRAMUSCULAR

## 2021-04-06 MED ORDER — TRAMADOL HCL 50 MG PO TABS: 50.0000 mg | ORAL_TABLET | Freq: Three times a day (TID) | ORAL | 0 refills | Status: AC | PRN

## 2021-04-06 MED ORDER — PREDNISONE 20 MG PO TABS: 20.0000 mg | ORAL_TABLET | Freq: Every day | ORAL | 0 refills | Status: DC

## 2021-04-06 NOTE — Telephone Encounter (Signed)
Per access nurse call, Pt needed to be schedule within 4 hours. Schedule Pt with Dutch Quint for 04/06/21 @ 3:45pm.

## 2021-04-06 NOTE — Telephone Encounter (Signed)
Pt called in stating she has been experiencing pain in her neck for over a week and a half. Pt states it has now got to the point she is very uncomfortable. Pt states the pain goes up and behind her ear. Pt was sent to access nurse.

## 2021-04-06 NOTE — Telephone Encounter (Signed)
Awaiting access nurse note

## 2021-04-06 NOTE — Telephone Encounter (Signed)
Pt has been scheduled for this afternoon with Dutch Quint, NP

## 2021-04-07 NOTE — Progress Notes (Signed)
Acute Office Visit  Subjective:    Patient ID: Sarah [name], female    DOB: [birth year], 70 y.o.   MRN: [id number]  Chief Complaint  Patient presents with   Acute Visit    Neck pain     HPI Patient is in today with c/o severe neck pain x 1 week. Has been taking Motrin that has not helped much. Pain 10/10, worse when sitting still. Has tried a heating pad that has not helped. Has never had any injury in the past to the neck.   Past Medical History:  Diagnosis Date   Arthritis    Asthma    Depression    Dyspnea    with heat   Environmental and seasonal allergies    GERD (gastroesophageal reflux disease)    Hypertension    Presence of dental prosthetic device    Dental implants   Wheezing     Past Surgical History:  Procedure Laterality Date   ABDOMINAL HYSTERECTOMY  1989   BROW LIFT Bilateral 02/07/2020   Procedure: BLEPHAROPLASTY UPPER EYELID; W/EXCESS SKIN BROW PTOSIS REPAIR BILATERAL;  Surgeon: Karle Starch, MD;  Location: [facility];  Service: Ophthalmology;  Laterality: Bilateral;   CATARACT EXTRACTION W/PHACO Right 09/13/2016   Procedure: CATARACT EXTRACTION PHACO AND INTRAOCULAR LENS PLACEMENT (IOC);  Surgeon: Birder Robson, MD;  Location: [facility];  Service: Ophthalmology;  Laterality: Right;  [place] 00:38 AP% 14.3 CDE 5.51 Fluid pack lot # [id number]   CATARACT EXTRACTION W/PHACO Left 10/11/2016   Procedure: CATARACT EXTRACTION PHACO AND INTRAOCULAR LENS PLACEMENT (IOC);  Surgeon: Birder Robson, MD;  Location: [facility];  Service: Ophthalmology;  Laterality: Left;  [place] 00:30 AP% 11.3 CDE 3.50 fluid pack lot # [id number]   COLONOSCOPY WITH PROPOFOL N/A 09/30/2014   Procedure: COLONOSCOPY WITH PROPOFOL;  Surgeon: Robert Bellow, MD;  Location: [facility] ENDOSCOPY;  Service: Endoscopy;  Laterality: N/A;   COLONOSCOPY WITH PROPOFOL N/A 03/24/2021   Procedure: COLONOSCOPY WITH PROPOFOL;  Surgeon: Lin Landsman, MD;  Location: [facility] ENDOSCOPY;  Service:  Gastroenterology;  Laterality: N/A;   ESOPHAGOGASTRODUODENOSCOPY N/A 09/30/2014   Procedure: ESOPHAGOGASTRODUODENOSCOPY (EGD);  Surgeon: Robert Bellow, MD;  Location: [facility] ENDOSCOPY;  Service: Endoscopy;  Laterality: N/A;   ESOPHAGOGASTRODUODENOSCOPY N/A 03/24/2021   Procedure: ESOPHAGOGASTRODUODENOSCOPY (EGD);  Surgeon: Lin Landsman, MD;  Location: [facility] ENDOSCOPY;  Service: Gastroenterology;  Laterality: N/A;   FOOT SURGERY Left 1985   KNEE SURGERY  2016    Family History  Problem Relation Age of Onset   Diabetes Mother    Hyperlipidemia Mother    Heart disease Mother    Prostate cancer Neg Hx    Kidney cancer Neg Hx    Bladder Cancer Neg Hx     Social History   Socioeconomic History   Marital status: Single    Spouse name: Not on file   Number of children: Not on file   Years of education: Not on file   Highest education level: Not on file  Occupational History   Not on file  Tobacco Use   Smoking status: Never   Smokeless tobacco: Never  Vaping Use   Vaping Use: Never used  Substance and Sexual Activity   Alcohol use: No   Drug use: No   Sexual activity: Never  Other Topics Concern   Not on file  Social History Narrative   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 No smoking; no alcohol. Lives in [place], [place] lives self/kitty.    Social Determinants  of Health   Financial Resource Strain: Medium Risk   Difficulty of Paying Living Expenses: Somewhat hard  Food Insecurity: No Food Insecurity   Worried About Running Out of Food in the Last Year: Never true   Ran Out of Food in the Last Year: Never true  Transportation Needs: No Transportation Needs   Lack of Transportation (Medical): No   Lack of Transportation (Non-Medical): No  Physical Activity: Unknown   Days of Exercise per Week: 0 days   Minutes of Exercise per Session: Not on file  Stress: No Stress Concern Present   Feeling of Stress : Not at all  Social Connections: Unknown   Frequency of  Communication with Friends and Family: More than three times a week   Frequency of Social Gatherings with Friends and Family: More than three times a week   Attends Religious Services: More than 4 times per year   Active Member of [employer] or Organizations: Yes   Attends Music therapist: More than 4 times per year   Marital Status: Not on file  Intimate Partner Violence: Not At Risk   Fear of Current or Ex-Partner: No   Emotionally Abused: No   Physically Abused: No   Sexually Abused: No    Outpatient Medications Prior to Visit  Medication Sig Dispense Refill   albuterol (VENTOLIN HFA) 108 (90 Base) MCG/ACT inhaler Inhale 2 puffs into the lungs every 6 (six) hours as needed for wheezing or shortness of breath. 1 each 11   amLODipine (NORVASC) 5 MG tablet Take 1 tablet (5 mg total) by mouth daily. 90 tablet 1   budesonide (PULMICORT) 0.25 MG/2ML nebulizer solution Take 2 mLs (0.25 mg total) by nebulization 2 (two) times daily. 60 mL 12   Calcium Citrate-Vitamin D (CALCIUM CITRATE + D PO) Take 1 tablet by mouth 2 (two) times daily.     citalopram (CELEXA) 20 MG tablet Take 1 tablet (20 mg total) by mouth daily. 90 tablet 1   famotidine (PEPCID) 20 MG tablet TAKE 1 TABLET BY MOUTH DAILY. BEFORE DINNER (Patient taking differently: Take 20 mg by mouth daily.) 30 tablet 11   fexofenadine (ALLEGRA) 180 MG tablet Take 180 mg by mouth daily.     Fluticasone-Umeclidin-Vilant (TRELEGY ELLIPTA) 100-62.5-25 MCG/INH AEPB Inhale 1 puff into the lungs daily. 180 each 3   gabapentin (NEURONTIN) 100 MG capsule TAKE 1 CAPSULE BY MOUTH 3 TIMES DAILY 90 capsule 3   ipratropium-albuterol (DUONEB) 0.5-2.5 (3) MG/3ML SOLN Take 3 mLs by nebulization every 6 (six) hours as needed. 360 mL 2   levothyroxine (SYNTHROID) 50 MCG tablet TAKE 1 TABLET EVERY DAY ON EMPTY STOMACHWITH A GLASS OF WATER AT LEAST 30-60 MINBEFORE BREAKFAST 90 tablet 1   mirabegron ER (MYRBETRIQ) 50 MG TB24 tablet Take 1 tablet (50 mg  total) by mouth daily. 30 tablet 0   pantoprazole (PROTONIX) 40 MG tablet TAKE ONE TABLET BY MOUTH EVERY MORNING 30 MIN BEFORE BREAKFAST 90 tablet 3   traZODone (DESYREL) 50 MG tablet Take 0.5-1 tablets (25-50 mg total) by mouth at bedtime as needed for sleep. 30 tablet 3   vitamin B-12 (CYANOCOBALAMIN) 1000 MCG tablet Take 1,000 mcg by mouth daily.     No facility-administered medications prior to visit.    No Known Allergies  Review of Systems  Respiratory: Negative.    Cardiovascular: Negative.   Endocrine: Negative.   Musculoskeletal:  Positive for neck pain.  Skin: Negative.   Neurological: Negative.   Psychiatric/Behavioral: Negative.  All other systems reviewed and are negative.     Objective:    Physical Exam Vitals and nursing note reviewed.  Constitutional:      Appearance: Normal appearance.  Neck:     Comments: Right neck pain to the right of the spine. Dime sized superficial wound also noted.  Cardiovascular:     Rate and Rhythm: Normal rate and regular rhythm.  Pulmonary:     Effort: Pulmonary effort is normal.     Breath sounds: Normal breath sounds.  Musculoskeletal:     Cervical back: Normal range of motion. Tenderness present.  Skin:    General: Skin is warm and dry.  Neurological:     General: No focal deficit present.     Mental Status: She is alert and oriented to person, place, and time.  Psychiatric:        Mood and Affect: Mood normal.        Behavior: Behavior normal.    BP (!) 150/70    Pulse 81    Temp 98.9 F (37.2 C) (Oral)    Ht 5\' 3"  (1.6 m)    Wt 184 lb 3.2 oz (83.6 kg)    SpO2 96%    BMI 32.63 kg/m  Wt Readings from Last 3 Encounters:  04/06/21 184 lb 3.2 oz (83.6 kg)  03/24/21 184 lb (83.5 kg)  03/05/21 185 lb (83.9 kg)    There are no preventive care reminders to display for this patient.  There are no preventive care reminders to display for this patient.   Lab Results  Component Value Date   TSH 2.08 12/10/2020    Lab Results  Component Value Date   WBC 5.7 01/27/2021   HGB 12.1 01/27/2021   HCT 36.5 01/27/2021   MCV 104.0 (H) 01/27/2021   PLT 142 (L) 01/27/2021   Lab Results  Component Value Date   NA 137 01/27/2021   K 4.0 01/27/2021   CO2 29 01/27/2021   GLUCOSE 94 01/27/2021   BUN 15 01/27/2021   CREATININE 0.75 01/27/2021   BILITOT 0.7 01/27/2021   ALKPHOS 77 01/27/2021   AST 29 01/27/2021   ALT 38 01/27/2021   PROT 7.5 01/27/2021   ALBUMIN 4.3 01/27/2021   CALCIUM 9.1 01/27/2021   ANIONGAP 7 01/27/2021   GFR 70.93 12/10/2020   Lab Results  Component Value Date   CHOL 221 (H) 06/12/2020   Lab Results  Component Value Date   HDL 65.50 06/12/2020   Lab Results  Component Value Date   LDLCALC 143 (H) 06/12/2020   Lab Results  Component Value Date   TRIG 63.0 06/12/2020   Lab Results  Component Value Date   CHOLHDL 3 06/12/2020   Lab Results  Component Value Date   HGBA1C 6.4 12/10/2020       Assessment & Plan:   Problem List Items Addressed This Visit   None Visit Diagnoses     Neck pain    -  Primary   Relevant Medications   ketorolac (TORADOL) injection 60 mg (Completed)   Other Relevant Orders   DG Cervical Spine Complete   Cervicalgia       Relevant Medications   ketorolac (TORADOL) injection 60 mg (Completed)   Other Relevant Orders   DG Cervical Spine Complete        Meds ordered this encounter  Medications   ketorolac (TORADOL) injection 60 mg   predniSONE (DELTASONE) 20 MG tablet    Sig: Take 1 tablet (20  mg total) by mouth daily with breakfast. 3 tabs x 3 days, 2 tabs x 3 days, 1 tab x 3 days    Dispense:  18 tablet    Refill:  0   traMADol (ULTRAM) 50 MG tablet    Sig: Take 1 tablet (50 mg total) by mouth every 8 (eight) hours as needed for up to 5 days.    Dispense:  15 tablet    Refill:  0    Plan: Will follow-up pending xray and discuss further treatment plan thereafter. Call the office if symptoms worsen or persist.  Recheck as scheduled and sooner as needed.    Kennyth Arnold, FNP

## 2021-04-08 NOTE — Telephone Encounter (Signed)
The patient is requesting her x-ray results.

## 2021-04-08 NOTE — Telephone Encounter (Signed)
Spoke with pt and informed her of her xray results. Pt stated that she was asked during her appt if her hands and arms had been going numb. Pt stated that it had not happened until last night during the night.

## 2021-04-08 NOTE — Telephone Encounter (Signed)
LMTCB

## 2021-04-09 NOTE — Telephone Encounter (Signed)
Spoke with pt and informed her of of Padonda's message below. Pt stated that the pain has gotten some better and she was able to sleep better last night.

## 2021-04-09 NOTE — Telephone Encounter (Signed)
Pt returning call. Pt requesting callback.  °

## 2021-04-13 ENCOUNTER — Other Ambulatory Visit: Payer: Self-pay | Admitting: Internal Medicine

## 2021-04-14 ENCOUNTER — Ambulatory Visit (INDEPENDENT_AMBULATORY_CARE_PROVIDER_SITE_OTHER): Payer: Medicare Other

## 2021-04-14 VITALS — BP 130/82 | Ht 63.0 in | Wt 184.0 lb

## 2021-04-14 DIAGNOSIS — Z1231 Encounter for screening mammogram for malignant neoplasm of breast: Secondary | ICD-10-CM

## 2021-04-14 DIAGNOSIS — Z0000 Encounter for general adult medical examination without abnormal findings: Secondary | ICD-10-CM

## 2021-04-14 NOTE — Patient Instructions (Addendum)
Sarah [name] , Thank you for taking time to come for your Medicare Wellness Visit. I appreciate your ongoing commitment to your health goals. Please review the following plan we discussed and let me know if I can assist you in the future.   These are the goals we discussed:  Goals       Patient Stated     DIET - REDUCE SUGAR INTAKE (pt-stated)      Low Carb Diet Portion control Healthy choices      Increase physical activity (pt-stated)      Walk for exercise      Medication Monitoring (pt-stated)      Patient Goals/Self-Care Activities Over the next 90 days, patient will:  - take medications as prescribed collaborate with provider on medication access solutions          This is a list of the screening recommended for you and due dates:  Health Maintenance  Topic Date Due   Mammogram  04/29/2021   Colon Cancer Screening  03/24/2024   Tetanus Vaccine  06/18/2024   Pneumonia Vaccine  Completed   Flu Shot  Completed   DEXA scan (bone density measurement)  Completed   COVID-19 Vaccine  Completed   Hepatitis C Screening: USPSTF Recommendation to screen - Ages 68-79 yo.  Completed   Zoster (Shingles) Vaccine  Completed   HPV Vaccine  Aged Out    Advanced directives: not yet completed  Conditions/risks identified: none new  Follow up in one year for your annual wellness visit    Preventive Care 65 Years and Older, Female Preventive care refers to lifestyle choices and visits with your health care provider that can promote health and wellness. What does preventive care include? A yearly physical exam. This is also called an annual well check. Dental exams once or twice a year. Routine eye exams. Ask your health care provider how often you should have your eyes checked. Personal lifestyle choices, including: Daily care of your teeth and gums. Regular physical activity. Eating a healthy diet. Avoiding tobacco and drug use. Limiting alcohol use. Practicing safe  sex. Taking low-dose aspirin every day. Taking vitamin and mineral supplements as recommended by your health care provider. What happens during an annual well check? The services and screenings done by your health care provider during your annual well check will depend on your age, overall health, lifestyle risk factors, and family history of disease. Counseling  Your health care provider may ask you questions about your: Alcohol use. Tobacco use. Drug use. Emotional well-being. Home and relationship well-being. Sexual activity. Eating habits. History of falls. Memory and ability to understand (cognition). Work and work Statistician. Reproductive health. Screening  You may have the following tests or measurements: Height, weight, and BMI. Blood pressure. Lipid and cholesterol levels. These may be checked every 5 years, or more frequently if you are over 73 years old. Skin check. Lung cancer screening. You may have this screening every year starting at age 23 if you have a 30-pack-year history of smoking and currently smoke or have quit within the past 15 years. Fecal occult blood test (FOBT) of the stool. You may have this test every year starting at age 38. Flexible sigmoidoscopy or colonoscopy. You may have a sigmoidoscopy every 5 years or a colonoscopy every 10 years starting at age 88. Hepatitis C blood test. Hepatitis B blood test. Sexually transmitted disease (STD) testing. Diabetes screening. This is done by checking your blood sugar (glucose) after you have not  eaten for a while (fasting). You may have this done every 1-3 years. Bone density scan. This is done to screen for osteoporosis. You may have this done starting at age 56. Mammogram. This may be done every 1-2 years. Talk to your health care provider about how often you should have regular mammograms. Talk with your health care provider about your test results, treatment options, and if necessary, the need for more  tests. Vaccines  Your health care provider may recommend certain vaccines, such as: Influenza vaccine. This is recommended every year. Tetanus, diphtheria, and acellular pertussis (Tdap, Td) vaccine. You may need a Td booster every 10 years. Zoster vaccine. You may need this after age 44. Pneumococcal 13-valent conjugate (PCV13) vaccine. One dose is recommended after age 71. Pneumococcal polysaccharide (PPSV23) vaccine. One dose is recommended after age 30. Talk to your health care provider about which screenings and vaccines you need and how often you need them. This information is not intended to replace advice given to you by your health care provider. Make sure you discuss any questions you have with your health care provider. Document Released: 05/08/2015 Document Revised: 12/30/2015 Document Reviewed: 02/10/2015 Elsevier Interactive Patient Education  2017 [place] Prevention in the Home Falls can cause injuries. They can happen to people of all ages. There are many things you can do to make your home safe and to help prevent falls. What can I do on the outside of my home? Regularly fix the edges of walkways and driveways and fix any cracks. Remove anything that might make you trip as you walk through a door, such as a raised step or threshold. Trim any bushes or trees on the path to your home. Use bright outdoor lighting. Clear any walking paths of anything that might make someone trip, such as rocks or tools. Regularly check to see if handrails are loose or broken. Make sure that both sides of any steps have handrails. Any raised decks and porches should have guardrails on the edges. Have any leaves, snow, or ice cleared regularly. Use sand or salt on walking paths during winter. Clean up any spills in your garage right away. This includes oil or grease spills. What can I do in the bathroom? Use night lights. Install grab bars by the toilet and in the tub and shower.  Do not use towel bars as grab bars. Use non-skid mats or decals in the tub or shower. If you need to sit down in the shower, use a plastic, non-slip stool. Keep the floor dry. Clean up any water that spills on the floor as soon as it happens. Remove soap buildup in the tub or shower regularly. Attach bath mats securely with double-sided non-slip rug tape. Do not have throw rugs and other things on the floor that can make you trip. What can I do in the bedroom? Use night lights. Make sure that you have a light by your bed that is easy to reach. Do not use any sheets or blankets that are too big for your bed. They should not hang down onto the floor. Have a firm chair that has side arms. You can use this for support while you get dressed. Do not have throw rugs and other things on the floor that can make you trip. What can I do in the kitchen? Clean up any spills right away. Avoid walking on wet floors. Keep items that you use a lot in easy-to-reach places. If you need to reach  something above you, use a strong step stool that has a grab bar. Keep electrical cords out of the way. Do not use floor polish or wax that makes floors slippery. If you must use wax, use non-skid floor wax. Do not have throw rugs and other things on the floor that can make you trip. What can I do with my stairs? Do not leave any items on the stairs. Make sure that there are handrails on both sides of the stairs and use them. Fix handrails that are broken or loose. Make sure that handrails are as long as the stairways. Check any carpeting to make sure that it is firmly attached to the stairs. Fix any carpet that is loose or worn. Avoid having throw rugs at the top or bottom of the stairs. If you do have throw rugs, attach them to the floor with carpet tape. Make sure that you have a light switch at the top of the stairs and the bottom of the stairs. If you do not have them, ask someone to add them for you. What else  can I do to help prevent falls? Wear shoes that: Do not have high heels. Have rubber bottoms. Are comfortable and fit you well. Are closed at the toe. Do not wear sandals. If you use a stepladder: Make sure that it is fully opened. Do not climb a closed stepladder. Make sure that both sides of the stepladder are locked into place. Ask someone to hold it for you, if possible. Clearly mark and make sure that you can see: Any grab bars or handrails. First and last steps. Where the edge of each step is. Use tools that help you move around (mobility aids) if they are needed. These include: Canes. Walkers. Scooters. Crutches. Turn on the lights when you go into a dark area. Replace any light bulbs as soon as they burn out. Set up your furniture so you have a clear path. Avoid moving your furniture around. If any of your floors are uneven, fix them. If there are any pets around you, be aware of where they are. Review your medicines with your doctor. Some medicines can make you feel dizzy. This can increase your chance of falling. Ask your doctor what other things that you can do to help prevent falls. This information is not intended to replace advice given to you by your health care provider. Make sure you discuss any questions you have with your health care provider. Document Released: 02/05/2009 Document Revised: 09/17/2015 Document Reviewed: 05/16/2014 Elsevier Interactive Patient Education  2017 [employer].

## 2021-04-14 NOTE — Progress Notes (Addendum)
Subjective:   Sarah [name] is a 70 y.o. female who presents for Medicare Annual (Subsequent) preventive examination.  Review of Systems    No ROS.  Medicare Wellness Virtual Visit.  Visual/audio telehealth visit, UTA vital signs.   See social history for additional risk factors.   Cardiac Risk Factors include: advanced age (>63men, >47 women);hypertension     Objective:    Today's Vitals   04/14/21 1032  BP: 130/82  Weight: 184 lb (83.5 kg)  Height: 5\' 3"  (1.6 m)   Body mass index is 32.59 kg/m.  Advanced Directives 04/14/2021 03/24/2021 02/10/2021 04/13/2020 02/07/2020 04/11/2019 06/28/2018  Does Patient Have a Medical Advance Directive? No No No No No Yes Yes  Type of Advance Directive - - - - - Press photographer;Living will [place]  Does patient want to make changes to medical advance directive? - - - No - Patient declined - No - Patient declined No - Patient declined  Copy of [place] in Chart? - - - - - No - copy requested No - copy requested  Would patient like information on creating a medical advance directive? No - Patient declined No - Patient declined No - Patient declined - No - Patient declined - No - Patient declined    Current Medications (verified) Outpatient Encounter Medications as of 04/14/2021  Medication Sig   albuterol (VENTOLIN HFA) 108 (90 Base) MCG/ACT inhaler Inhale 2 puffs into the lungs every 6 (six) hours as needed for wheezing or shortness of breath.   amLODipine (NORVASC) 5 MG tablet Take 1 tablet (5 mg total) by mouth daily.   budesonide (PULMICORT) 0.25 MG/2ML nebulizer solution Take 2 mLs (0.25 mg total) by nebulization 2 (two) times daily.   Calcium Citrate-Vitamin D (CALCIUM CITRATE + D PO) Take 1 tablet by mouth 2 (two) times daily.   citalopram (CELEXA) 20 MG tablet TAKE 1 TABLET BY MOUTH ONCE DAILY.   famotidine (PEPCID) 20 MG tablet TAKE 1 TABLET BY MOUTH DAILY. BEFORE DINNER  (Patient taking differently: Take 20 mg by mouth daily.)   fexofenadine (ALLEGRA) 180 MG tablet Take 180 mg by mouth daily.   Fluticasone-Umeclidin-Vilant (TRELEGY ELLIPTA) 100-62.5-25 MCG/INH AEPB Inhale 1 puff into the lungs daily.   gabapentin (NEURONTIN) 100 MG capsule TAKE 1 CAPSULE BY MOUTH 3 TIMES DAILY   ipratropium-albuterol (DUONEB) 0.5-2.5 (3) MG/3ML SOLN Take 3 mLs by nebulization every 6 (six) hours as needed.   levothyroxine (SYNTHROID) 50 MCG tablet TAKE 1 TABLET EVERY DAY ON EMPTY STOMACHWITH A GLASS OF WATER AT LEAST 30-60 MINBEFORE BREAKFAST   mirabegron ER (MYRBETRIQ) 50 MG TB24 tablet Take 1 tablet (50 mg total) by mouth daily.   pantoprazole (PROTONIX) 40 MG tablet TAKE ONE TABLET BY MOUTH EVERY MORNING 30 MIN BEFORE BREAKFAST   predniSONE (DELTASONE) 20 MG tablet Take 1 tablet (20 mg total) by mouth daily with breakfast. 3 tabs x 3 days, 2 tabs x 3 days, 1 tab x 3 days   traZODone (DESYREL) 50 MG tablet Take 0.5-1 tablets (25-50 mg total) by mouth at bedtime as needed for sleep.   vitamin B-12 (CYANOCOBALAMIN) 1000 MCG tablet Take 1,000 mcg by mouth daily.   No facility-administered encounter medications on file as of 04/14/2021.    Allergies (verified) Patient has no known allergies.   History: Past Medical History:  Diagnosis Date   Arthritis    Asthma    Depression    Dyspnea    with  heat   Environmental and seasonal allergies    GERD (gastroesophageal reflux disease)    Hypertension    Presence of dental prosthetic device    Dental implants   Wheezing    Past Surgical History:  Procedure Laterality Date   ABDOMINAL HYSTERECTOMY  1989   BROW LIFT Bilateral 02/07/2020   Procedure: BLEPHAROPLASTY UPPER EYELID; W/EXCESS SKIN BROW PTOSIS REPAIR BILATERAL;  Surgeon: Karle Starch, MD;  Location: [facility];  Service: Ophthalmology;  Laterality: Bilateral;   CATARACT EXTRACTION W/PHACO Right 09/13/2016   Procedure: CATARACT EXTRACTION PHACO AND  INTRAOCULAR LENS PLACEMENT (IOC);  Surgeon: Birder Robson, MD;  Location: [facility];  Service: Ophthalmology;  Laterality: Right;  [place] 00:38 AP% 14.3 CDE 5.51 Fluid pack lot # [id number]   CATARACT EXTRACTION W/PHACO Left 10/11/2016   Procedure: CATARACT EXTRACTION PHACO AND INTRAOCULAR LENS PLACEMENT (IOC);  Surgeon: Birder Robson, MD;  Location: [facility];  Service: Ophthalmology;  Laterality: Left;  [place] 00:30 AP% 11.3 CDE 3.50 fluid pack lot # [id number]   COLONOSCOPY WITH PROPOFOL N/A 09/30/2014   Procedure: COLONOSCOPY WITH PROPOFOL;  Surgeon: Robert Bellow, MD;  Location: [facility]COPY;  Service: Endoscopy;  Laterality: N/A;   COLONOSCOPY WITH PROPOFOL N/A 03/24/2021   Procedure: COLONOSCOPY WITH PROPOFOL;  Surgeon: Lin Landsman, MD;  Location[facility] END[facility];  Service: Gastroenterology;  Laterality: N/A;   ESOPHAGOGASTRODUODENOSCOPY N/A 09/30/2014   Procedure: ESOPHAGOGASTRODUODENOSCOPY (EGD);  Surgeon: Robert Bellow, MD;  Location: [facility] ENDOSCOPY;  Service: Endoscopy;  Laterality: N/A;   ESOPHAGOGASTRODUODENOSCOPY N/A 03/24/2021   Procedure: ESOPHAGOGASTRODUODENOSCOPY (EGD);  Surgeon: Lin Landsman, MD;  Location: [facility] ENDOSCOPY;  Service: Gastroenterology;  Laterality: N/A;   FOOT SURGERY Left 1985   KNEE SURGERY  2016   Family History  Problem Relation Age of Onset   Diabetes Mother    Hyperlipidemia Mother    Heart disease Mother    Prostate cancer Neg Hx    Kidney cancer Neg Hx    Bladder Cancer Neg Hx    Social History   Socioeconomic History   Marital status: Single    Spouse name: Not on file   Number of children: Not on file   Years of education: Not on file   Highest education level: Not on file  Occupational History   Not on file  Tobacco Use   Smoking status: Never   Smokeless tobacco: Never  Vaping Use   Vaping Use: Never used  Substance and Sexual Activity   Alcohol use: No   Drug use: No   Sexual activity: Never  Other Topics  Concern   Not on file  Social History Narrative   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 No smoking; no alcohol. Lives in [place], [place] lives self/kitty.    Social Determinants of Health   Financial Resource Strain: Medium Risk   Difficulty of Paying Living Expenses: Somewhat hard  Food Insecurity: No Food Insecurity   Worried About Charity fundraiser in the Last Year: Never true   Ran Out of Food in the Last Year: Never true  Transportation Needs: No Transportation Needs   Lack of Transportation (Medical): No   Lack of Transportation (Non-Medical): No  Physical Activity: Unknown   Days of Exercise per Week: 0 days   Minutes of Exercise per Session: Not on file  Stress: No Stress Concern Present   Feeling of Stress : Not at all  Social Connections: Unknown   Frequency of Communication with Friends and Family: More than  three times a week   Frequency of Social Gatherings with Friends and Family: More than three times a week   Attends Religious Services: More than 4 times per year   Active Member of [employer] or Organizations: Yes   Attends Music therapist: More than 4 times per year   Marital Status: Not on file    Tobacco Counseling Counseling given: Not Answered   Clinical Intake:  Pre-visit preparation completed: Yes        Diabetes: No  How often do you need to have someone help you when you read instructions, pamphlets, or other written materials from your doctor or pharmacy?: 1 - Never  Interpreter Needed?: No      Activities of Daily Living In your present state of health, do you have any difficulty performing the following activities: 04/14/2021  Hearing? N  Vision? N  Difficulty concentrating or making decisions? N  Walking or climbing stairs? N  Dressing or bathing? N  Doing errands, shopping? N  Preparing Food and eating ? N  Using the Toilet? N  In the past six months, have you accidently leaked urine? Y  Comment Wears depend  brief at night  Do you have problems with loss of bowel control? N  Managing your Medications? N  Managing your Finances? N  Housekeeping or managing your Housekeeping? N  Some recent data might be hidden    Patient Care Team: Crecencio Mc, MD as PCP - General (Internal Medicine) Crecencio Mc, MD (Internal Medicine) Bary Castilla Forest Gleason, MD (General Surgery) De Hollingshead, RPH-CPP (Pharmacist)  Indicate any recent Medical Services you may have received from other than [facility] providers in the past year (date may be approximate).     Assessment:   This is a routine wellness examination for Gladis.  Virtual Visit via Telephone Note  I connected with  [name] on 04/14/21 at 10:30 AM EST by telephone and verified that I am speaking with the correct person using two identifiers.  Persons participating in the virtual visit: patient/Nurse Health Advisor   I discussed the limitations, risks, security and privacy concerns of performing an evaluation and management service by telephone and the availability of in person appointments. The patient expressed understanding and agreed to proceed.  Interactive audio and video telecommunications were attempted between this nurse and patient, however failed, due to patient having technical difficulties OR patient did not have access to video capability.  We continued and completed visit with audio only.  Some vital signs may be absent or patient reported.   Hearing/Vision screen Hearing Screening - Comments:: Patient is able to hear conversational tones without difficulty.  No issues reported. Vision Screening - Comments:: Followed by Dr. Leonia Corona. At [facility].  Wears corrective lenses.   Dietary issues and exercise activities discussed: Current Exercise Habits: The patient does not participate in regular exercise at present Regular diet Good water intake    Goals Addressed               This Visit's  Progress     Patient Stated     DIET - REDUCE SUGAR INTAKE (pt-stated)        Low Carb Diet Portion control Healthy choices       Depression Screen PHQ 2/9 Scores 04/14/2021 01/13/2021 12/10/2020 11/26/2020 06/14/2020 04/13/2020 04/11/2019  PHQ - 2 Score 0 2 0 0 0 0 0  PHQ- 9 Score - 5 - 0 0 - -  Fall Risk Fall Risk  04/14/2021 12/10/2020 11/26/2020 08/06/2020 06/12/2020  Falls in the past year? - 1 1 1  0  Number falls in past yr: - 1 0 1 -  Comment - - - - -  Injury with Fall? - 0 0 0 -  Comment - - - - -  Risk for fall due to : - History of fall(s) History of fall(s) - -  Follow up Falls evaluation completed Falls evaluation completed Falls evaluation completed Falls evaluation completed Falls evaluation completed    [place]: Home free of loose throw rugs in walkways, pet beds, electrical cords, etc? Yes  Adequate lighting in your home to reduce risk of falls? Yes   ASSISTIVE DEVICES UTILIZED TO PREVENT FALLS: Life alert? No  Use of a cane, walker or w/c? No  Grab bars in the bathroom? Yes  Shower chair or bench in shower? No  Elevated toilet seat or a handicapped toilet? Yes   TIMED UP AND GO: Was the test performed? No .   Cognitive Function: MMSE - Mini Mental State Exam 04/09/2018 09/01/2014  Orientation to time 4 5  Orientation to time comments Difficulty drawing clock face with time.  -  Orientation to Place 5 5  Registration 3 3  Attention/ Calculation 2 5  Attention/Calculation-comments Difficulty with calculation -  Recall 1 3  Recall-comments 1 out of 3 recall -  Language- name 2 objects 2 2  Language- repeat 1 1  Language- follow 3 step command 3 3  Language- read & follow direction 1 1  Write a sentence 0 1  Write a sentence-comments Difficulty completing sentence.  -  Copy design 1 1  Total score 23 30     6CIT Screen 04/14/2021 04/13/2020 04/11/2019  What Year? 0 points 0 points 0 points  What month? 0 points 0  points 0 points  What time? 0 points 0 points 0 points  Count back from 20 0 points 0 points 0 points  Months in reverse 0 points 0 points 0 points  Repeat phrase 0 points 4 points 0 points  Total Score 0 4 0    Immunizations Immunization History  Administered Date(s) Administered   Fluad Quad(high Dose 65+) 12/06/2018   Influenza, High Dose Seasonal PF 04/22/2016, 06/05/2017   Influenza,inj,Quad PF,6+ Mos 04/26/2013, 01/30/2015   Influenza-Unspecified 01/24/2018, 12/06/2018, 01/02/2020, 04/02/2021   Moderna Covid-19 Vaccine Bivalent Booster 28yrs & up 02/05/2021   Moderna Sars-Covid-2 Vaccination 06/20/2019, 07/22/2019, 02/14/2020   PNEUMOCOCCAL CONJUGATE-20 11/09/2020   Pneumococcal Conjugate-13 09/01/2014, 01/24/2018   Pneumococcal Polysaccharide-23 04/26/2013, 02/28/2019   Tdap 06/18/2014   Zoster Recombinat (Shingrix) 07/08/2017, 09/22/2017   Screening Tests Health Maintenance  Topic Date Due   MAMMOGRAM  04/29/2021   COLONOSCOPY (Pts 45-46yrs Insurance coverage will need to be confirmed)  03/24/2024   TETANUS/TDAP  06/18/2024   Pneumonia Vaccine 38+ Years old  Completed   INFLUENZA VACCINE  Completed   DEXA SCAN  Completed   COVID-19 Vaccine  Completed   Hepatitis C Screening  Completed   Zoster Vaccines- Shingrix  Completed   HPV VACCINES  Aged Out   Health Maintenance There are no preventive care reminders to display for this patient.  Mammogram- ordered. Schedule   Lung Cancer Screening: (Low Dose CT Chest recommended if Age 12-80 years, 30 pack-year currently smoking OR have quit w/in 15years.) does not qualify.   Vision Screening: Recommended annual ophthalmology exams for early detection of glaucoma and other  disorders of the eye.  Dental Screening: Recommended annual dental exams for proper oral hygiene.  Community Resource Referral / Chronic Care Management: CRR required this visit?  No   CCM required this visit?  No      Plan:   Keep all routine  maintenance appointments.   I have personally reviewed and noted the following in the patients chart:   Medical and social history Use of alcohol, tobacco or illicit drugs  Current medications and supplements including opioid prescriptions. Not taking opioid.  Functional ability and status Nutritional status Physical activity Advanced directives List of other physicians Hospitalizations, surgeries, and ER visits in previous 12 months Vitals Screenings to include cognitive, depression, and falls Referrals and appointments  In addition, I have reviewed and discussed with patient certain preventive protocols, quality metrics, and best practice recommendations. A written personalized care plan for preventive services as well as general preventive health recommendations were provided to patient.     OBrien-Blaney, Marshawn Ninneman L, LPN   39/53/2023    I have reviewed the above information and agree with above.   Deborra Medina, MD

## 2021-05-10 ENCOUNTER — Other Ambulatory Visit: Payer: Self-pay

## 2021-05-10 ENCOUNTER — Ambulatory Visit
Admission: RE | Admit: 2021-05-10 | Discharge: 2021-05-10 | Disposition: A | Discharge status: Home / Self Care | Payer: Medicare Other | Source: Ambulatory Visit | Attending: Internal Medicine | Admitting: Internal Medicine

## 2021-05-10 DIAGNOSIS — Z1231 Encounter for screening mammogram for malignant neoplasm of breast: Secondary | ICD-10-CM | POA: Insufficient documentation

## 2021-05-13 ENCOUNTER — Other Ambulatory Visit: Payer: Self-pay | Admitting: Internal Medicine

## 2021-05-13 DIAGNOSIS — I10 Essential (primary) hypertension: Secondary | ICD-10-CM

## 2021-05-18 DIAGNOSIS — H0014 Chalazion left upper eyelid: Secondary | ICD-10-CM | POA: Diagnosis not present

## 2021-05-19 ENCOUNTER — Other Ambulatory Visit: Payer: Self-pay

## 2021-05-19 DIAGNOSIS — J4541 Moderate persistent asthma with (acute) exacerbation: Secondary | ICD-10-CM

## 2021-05-19 DIAGNOSIS — I10 Essential (primary) hypertension: Secondary | ICD-10-CM

## 2021-05-19 MED ORDER — LEVOTHYROXINE SODIUM 50 MCG PO TABS: ORAL_TABLET | ORAL | 3 refills | Status: DC

## 2021-05-19 MED ORDER — ALBUTEROL SULFATE HFA 108 (90 BASE) MCG/ACT IN AERS: 2.0000 | INHALATION_SPRAY | Freq: Four times a day (QID) | RESPIRATORY_TRACT | 3 refills | Status: DC | PRN

## 2021-05-19 MED ORDER — PANTOPRAZOLE SODIUM 40 MG PO TBEC: 40.0000 mg | DELAYED_RELEASE_TABLET | Freq: Every day | ORAL | 3 refills | Status: DC

## 2021-05-19 MED ORDER — CITALOPRAM HYDROBROMIDE 20 MG PO TABS: 20.0000 mg | ORAL_TABLET | Freq: Every day | ORAL | 3 refills | Status: DC

## 2021-05-19 MED ORDER — GABAPENTIN 100 MG PO CAPS: 100.0000 mg | ORAL_CAPSULE | Freq: Three times a day (TID) | ORAL | 3 refills | Status: DC

## 2021-05-19 MED ORDER — AMLODIPINE BESYLATE 5 MG PO TABS: 5.0000 mg | ORAL_TABLET | Freq: Every day | ORAL | 3 refills | Status: DC

## 2021-05-19 MED ORDER — TRAZODONE HCL 50 MG PO TABS: 25.0000 mg | ORAL_TABLET | Freq: Every evening | ORAL | 1 refills | Status: DC | PRN

## 2021-05-25 ENCOUNTER — Other Ambulatory Visit: Payer: Self-pay

## 2021-06-07 ENCOUNTER — Other Ambulatory Visit: Payer: Self-pay

## 2021-06-07 ENCOUNTER — Ambulatory Visit (INDEPENDENT_AMBULATORY_CARE_PROVIDER_SITE_OTHER): Payer: Medicare Other | Admitting: Internal Medicine

## 2021-06-07 ENCOUNTER — Encounter: Payer: Self-pay | Admitting: Internal Medicine

## 2021-06-07 VITALS — BP 138/68 | HR 88 | Temp 97.7°F | Ht 63.0 in | Wt 182.8 lb

## 2021-06-07 DIAGNOSIS — E785 Hyperlipidemia, unspecified: Secondary | ICD-10-CM | POA: Diagnosis not present

## 2021-06-07 DIAGNOSIS — D539 Nutritional anemia, unspecified: Secondary | ICD-10-CM

## 2021-06-07 DIAGNOSIS — J4540 Moderate persistent asthma, uncomplicated: Secondary | ICD-10-CM | POA: Diagnosis not present

## 2021-06-07 DIAGNOSIS — R195 Other fecal abnormalities: Secondary | ICD-10-CM

## 2021-06-07 DIAGNOSIS — I10 Essential (primary) hypertension: Secondary | ICD-10-CM | POA: Diagnosis not present

## 2021-06-07 DIAGNOSIS — D696 Thrombocytopenia, unspecified: Secondary | ICD-10-CM

## 2021-06-07 DIAGNOSIS — E039 Hypothyroidism, unspecified: Secondary | ICD-10-CM | POA: Diagnosis not present

## 2021-06-07 DIAGNOSIS — D649 Anemia, unspecified: Secondary | ICD-10-CM

## 2021-06-07 MED ORDER — MIRABEGRON ER 50 MG PO TB24: 50.0000 mg | ORAL_TABLET | Freq: Every day | ORAL | 5 refills | Status: DC

## 2021-06-07 NOTE — Assessment & Plan Note (Signed)
Etiology unclear. Repeat labs needed

## 2021-06-07 NOTE — Progress Notes (Signed)
Subjective:  Patient ID: Sarah [name], female    DOB: [birth year]  Age: 71 y.o. MRN: [id number]  CC: The primary encounter diagnosis was Anemia, unspecified type. Diagnoses of Acquired hypothyroidism, Essential hypertension, Hyperlipidemia, unspecified hyperlipidemia type, Macrocytic anemia, Positive fecal occult blood test, and Thrombocytopenia ([place]) were also pertinent to this visit.   This visit occurred during the SARS-CoV-2 public health emergency.  Safety protocols were in place, including screening questions prior to the visit, additional usage of staff PPE, and extensive cleaning of exam room while observing appropriate contact time as indicated for disinfecting solutions.    HPI Sarah [name] presents for  Chief Complaint  Patient presents with   Follow-up    Follow up on hypertension, hypothyroidism    1) Hypertension: patient checks blood pressure twice weekly at home.  Readings have been for the most part < 140/80 at rest . 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  She was treated with toradol injection , prednisone taper . 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  Spasm noted,  no fractures . Pain has resolved.  Triggered by heavy lifting her friend who had fallen/  3) macrocytic anemia, heme positive.   Found during workup for weakness Presented with post viral fatigue in August. Workup negative thus far.  TA on colonoscopy.  EGD normal.  Liver MRI normal  except for cysts. MM workup ambiguous, repeat labs planned by hematology . Not taking iron      Outpatient Medications Prior to Visit  Medication Sig Dispense Refill   albuterol (VENTOLIN HFA) 108 (90 Base) MCG/ACT inhaler Inhale 2 puffs into the lungs every 6 (six) hours as needed for wheezing or shortness of breath. 3 each 3   amLODipine (NORVASC) 5 MG  tablet Take 1 tablet (5 mg total) by mouth daily. 90 tablet 3   budesonide (PULMICORT) 0.25 MG/2ML nebulizer solution Take 2 mLs (0.25 mg total) by nebulization 2 (two) times daily. 60 mL 12   Calcium Citrate-Vitamin D (CALCIUM CITRATE + D PO) Take 1 tablet by mouth 2 (two) times daily.     citalopram (CELEXA) 20 MG tablet Take 1 tablet (20 mg total) by mouth daily. 90 tablet 3   fexofenadine (ALLEGRA) 180 MG tablet Take 180 mg by mouth daily.     Fluticasone-Umeclidin-Vilant (TRELEGY ELLIPTA) 100-62.5-25 MCG/INH AEPB Inhale 1 puff into the lungs daily. 180 each 3   gabapentin (NEURONTIN) 100 MG capsule Take 1 capsule (100 mg total) by mouth 3 (three) times daily. 270 capsule 3   ipratropium-albuterol (DUONEB) 0.5-2.5 (3) MG/3ML SOLN Take 3 mLs by nebulization every 6 (six) hours as needed. 360 mL 2   levothyroxine (SYNTHROID) 50 MCG tablet TAKE 1 TABLET EVERY DAY ON EMPTY STOMACHWITH A GLASS OF WATER AT LEAST 30-60 MINBEFORE BREAKFAST 90 tablet 3   pantoprazole (PROTONIX) 40 MG tablet Take 1 tablet (40 mg total) by mouth daily. 90 tablet 3   traZODone (DESYREL) 50 MG tablet Take 0.5-1 tablets (25-50 mg total) by mouth at bedtime as needed for sleep. 90 tablet 1   vitamin B-12 (CYANOCOBALAMIN) 1000 MCG tablet Take 1,000 mcg by mouth daily.     mirabegron ER (MYRBETRIQ) 50 MG TB24 tablet Take 1 tablet (50 mg total) by mouth daily. 30 tablet 0   famotidine (PEPCID) 20 MG  tablet TAKE 1 TABLET BY MOUTH DAILY. BEFORE DINNER (Patient not taking: Reported on 06/07/2021) 30 tablet 11   predniSONE (DELTASONE) 20 MG tablet Take 1 tablet (20 mg total) by mouth daily with breakfast. 3 tabs x 3 days, 2 tabs x 3 days, 1 tab x 3 days (Patient not taking: Reported on 06/07/2021) 18 tablet 0   No facility-administered medications prior to visit.    Review of Systems;  Patient denies headache, fevers, malaise, unintentional weight loss, skin rash, eye pain, sinus congestion and sinus pain, sore throat, dysphagia,   hemoptysis , cough, dyspnea, wheezing, chest pain, palpitations, orthopnea, edema, abdominal pain, nausea, melena, diarrhea, constipation, flank pain, dysuria, hematuria, urinary  Frequency, nocturia, numbness, tingling, seizures,  Focal weakness, Loss of consciousness,  Tremor, insomnia, depression, anxiety, and suicidal ideation.      Objective:  BP 138/68 (BP Location: Left Arm, Patient Position: Sitting, Cuff Size: Normal)    Pulse 88    Temp 97.7 F (36.5 C) (Oral)    Ht _0  (1.6 m)    Wt 182 lb 12.8 oz (82.9 kg)    SpO2 95%    BMI 32.38 kg/m   BP Readings from Last 3 Encounters:  06/07/21 138/68  04/14/21 130/82  04/06/21 (!) 150/70    Wt Readings from Last 3 Encounters:  06/07/21 182 lb 12.8 oz (82.9 kg)  04/14/21 184 lb (83.5 kg)  04/06/21 184 lb 3.2 oz (83.6 kg)    General appearance: alert, cooperative and appears stated age Ears: normal TM's and external ear canals both ears Throat: lips, mucosa, and tongue normal; teeth and gums normal Neck: no adenopathy, no carotid bruit, supple, symmetrical, trachea midline and thyroid not enlarged, symmetric, no tenderness/mass/nodules Back: symmetric, no curvature. ROM normal. No CVA tenderness. Lungs: clear to auscultation bilaterally Heart: regular rate and rhythm, S1, S2 normal, no murmur, click, rub or gallop Abdomen: soft, non-tender; bowel sounds normal; no masses,  no organomegaly Pulses: 2+ and symmetric Skin: Skin color, texture, turgor normal. No rashes or lesions Lymph nodes: Cervical, supraclavicular, and axillary nodes normal.  Lab Results  Component Value Date   HGBA1C 6.4 12/10/2020   HGBA1C 6.0 06/12/2020   HGBA1C 5.9 04/08/2019    Lab Results  Component Value Date   CREATININE 0.75 01/27/2021   CREATININE 0.83 12/10/2020   CREATININE 0.94 06/12/2020    Lab Results  Component Value Date   WBC 5.7 01/27/2021   HGB 12.1 01/27/2021   HCT 36.5 01/27/2021   PLT 142 (L) 01/27/2021   GLUCOSE 94  01/27/2021   CHOL 221 (H) 06/12/2020   TRIG 63.0 06/12/2020   HDL 65.50 06/12/2020   LDLCALC 143 (H) 06/12/2020   ALT 38 01/27/2021   AST 29 01/27/2021   NA 137 01/27/2021   K 4.0 01/27/2021   CL 101 01/27/2021   CREATININE 0.75 01/27/2021   BUN 15 01/27/2021   CO2 29 01/27/2021   TSH 2.08 12/10/2020   HGBA1C 6.4 12/10/2020    Mammogram 3D SCREEN BREAST BILATERAL  Result Date: 05/10/2021 CLINICAL DATA:  Screening. EXAM: DIGITAL SCREENING BILATERAL MAMMOGRAM WITH TOMOSYNTHESIS AND CAD TECHNIQUE: Bilateral screening digital craniocaudal and mediolateral oblique mammograms were obtained. Bilateral screening digital breast tomosynthesis was performed. The images were evaluated with computer-aided detection. COMPARISON:  Previous exam(s). ACR Breast Density Category c: The breast tissue is heterogeneously dense, which may obscure small masses. FINDINGS: There are no findings suspicious for malignancy. IMPRESSION: No mammographic evidence of malignancy. A result letter of this  screening mammogram will be mailed directly to the patient. RECOMMENDATION: Screening mammogram in one year. (Code:SM-B-01Y) BI-RADS CATEGORY  1: Negative. Electronically Signed   By: Lillia Mountain M.D.   On: 05/10/2021 14:21   Assessment & Plan:   Problem List Items Addressed This Visit     Essential hypertension   Relevant Orders   Comp Met (CMET)   Urine Microalbumin w/creat. ratio   Hyperlipidemia   Relevant Orders   Lipid panel   Hypothyroid   Relevant Orders   TSH   Macrocytic anemia    Macrocytic,  With normal iron, folate  and b12 studies.   Now with thrombocytenia as well.   SPEP noted a faintly positive band of restricted protein,  But urine IFE was normal.  GFR is normal as well.  Hematology following with repeat tests planned.   Lab Results  Component Value Date   IRON 71 12/16/2020   TIBC 309 12/16/2020   FERRITIN 95 12/16/2020   Lab Results  Component Value Date   [id number] 833 01/27/2021     Lab Results  Component Value Date   FOLATE 18.3 01/27/2021   Lab Results  Component Value Date   CREATININE 0.75 01/27/2021         Positive fecal occult blood test    Colonoscopy removed one tubular adenoma       Thrombocytopenia ([place])    Etiology unclear. Repeat labs needed       Other Visit Diagnoses     Anemia, unspecified type    -  Primary   Relevant Orders   CBC with Differential/Platelet   Hemoglobin A1c   IBC + Ferritin       I spent 20 minutes dedicated to the care of this patient on the date of this encounter to include pre-visit review of patient's medical history,  most recent imaging studies, Face-to-face time with the patient , and post visit ordering of testing and therapeutics.    Follow-up: No follow-ups on file.   Crecencio Mc, MD

## 2021-06-07 NOTE — Assessment & Plan Note (Signed)
Well controlled on current regimen of amlodipine 5 mg daily .  Renal function stable, no changes today. 

## 2021-06-07 NOTE — Patient Instructions (Signed)
I'm glad you are feeling better.  We are rechecking your blood counts today  You did NOT have cancer.  That was mistakenly written as a reason for the MRI of your liver,  but it is not in your chart

## 2021-06-07 NOTE — Assessment & Plan Note (Signed)
Colonoscopy removed one tubular adenoma

## 2021-06-07 NOTE — Assessment & Plan Note (Signed)
Macrocytic,  With normal iron, folate  and b12 studies.   Now with thrombocytenia as well.   SPEP noted a faintly positive band of restricted protein,  But urine IFE was normal.  GFR is normal as well.  Hematology following with repeat tests planned.   Lab Results  Component Value Date   IRON 71 12/16/2020   TIBC 309 12/16/2020   FERRITIN 95 12/16/2020   Lab Results  Component Value Date   [id number] 616 01/27/2021    Lab Results  Component Value Date   FOLATE 18.3 01/27/2021   Lab Results  Component Value Date   CREATININE 0.75 01/27/2021

## 2021-06-07 NOTE — Assessment & Plan Note (Signed)
Asymptomatic since August

## 2021-06-08 ENCOUNTER — Other Ambulatory Visit: Payer: Self-pay | Admitting: *Deleted

## 2021-06-08 DIAGNOSIS — D539 Nutritional anemia, unspecified: Secondary | ICD-10-CM

## 2021-06-08 DIAGNOSIS — C787 Secondary malignant neoplasm of liver and intrahepatic bile duct: Secondary | ICD-10-CM

## 2021-06-08 LAB — LIPID PANEL
Cholesterol: 244 mg/dL — ABNORMAL HIGH (ref 0–200)
HDL: 71.4 mg/dL
LDL Cholesterol: 151 mg/dL — ABNORMAL HIGH (ref 0–99)
NonHDL: 172.4
Total CHOL/HDL Ratio: 3
Triglycerides: 106 mg/dL (ref 0.0–149.0)
VLDL: 21.2 mg/dL (ref 0.0–40.0)

## 2021-06-08 LAB — CBC WITH DIFFERENTIAL/PLATELET
Basophils Absolute: 0.2 10*3/uL — ABNORMAL HIGH (ref 0.0–0.1)
Basophils Relative: 3.3 % — ABNORMAL HIGH (ref 0.0–3.0)
Eosinophils Absolute: 0.1 10*3/uL (ref 0.0–0.7)
Eosinophils Relative: 2.5 % (ref 0.0–5.0)
HCT: 36.7 % (ref 36.0–46.0)
Hemoglobin: 12.2 g/dL (ref 12.0–15.0)
Lymphocytes Relative: 25.3 % (ref 12.0–46.0)
Lymphs Abs: 1.4 10*3/uL (ref 0.7–4.0)
MCHC: 33.2 g/dL (ref 30.0–36.0)
MCV: 102.9 fl — ABNORMAL HIGH (ref 78.0–100.0)
Monocytes Absolute: 0.4 10*3/uL (ref 0.1–1.0)
Monocytes Relative: 6.7 % (ref 3.0–12.0)
Neutro Abs: 3.5 10*3/uL (ref 1.4–7.7)
Neutrophils Relative %: 62.2 % (ref 43.0–77.0)
Platelets: 139 10*3/uL — ABNORMAL LOW (ref 150.0–400.0)
RBC: 3.57 Mil/uL — ABNORMAL LOW (ref 3.87–5.11)
RDW: 14.2 % (ref 11.5–15.5)
WBC: 5.6 10*3/uL (ref 4.0–10.5)

## 2021-06-08 LAB — MICROALBUMIN / CREATININE URINE RATIO
Creatinine,U: 103.5 mg/dL
Microalb Creat Ratio: 0.7 mg/g (ref 0.0–30.0)
Microalb, Ur: <0.7 mg/dL (ref 0.0–1.9)

## 2021-06-08 LAB — COMPREHENSIVE METABOLIC PANEL WITH GFR
ALT: 12 U/L (ref 0–35)
AST: 12 U/L (ref 0–37)
Albumin: 4.6 g/dL (ref 3.5–5.2)
Alkaline Phosphatase: 78 U/L (ref 39–117)
BUN: 18 mg/dL (ref 6–23)
CO2: 31 meq/L (ref 19–32)
Calcium: 9.6 mg/dL (ref 8.4–10.5)
Chloride: 103 meq/L (ref 96–112)
Creatinine, Ser: 0.87 mg/dL (ref 0.40–1.20)
GFR: 66.8 mL/min
Glucose, Bld: 87 mg/dL (ref 70–99)
Potassium: 4 meq/L (ref 3.5–5.1)
Sodium: 141 meq/L (ref 135–145)
Total Bilirubin: 0.5 mg/dL (ref 0.2–1.2)
Total Protein: 7.2 g/dL (ref 6.0–8.3)

## 2021-06-08 LAB — IBC + FERRITIN
Ferritin: 75 ng/mL (ref 10.0–291.0)
Iron: 87 ug/dL (ref 42–145)
Saturation Ratios: 24.9 % (ref 20.0–50.0)
TIBC: 350 ug/dL (ref 250.0–450.0)
Transferrin: 250 mg/dL (ref 212.0–360.0)

## 2021-06-08 LAB — TSH: TSH: 1.15 u[IU]/mL (ref 0.35–5.50)

## 2021-06-08 LAB — HEMOGLOBIN A1C: Hgb A1c MFr Bld: 6 % (ref 4.6–6.5)

## 2021-06-15 ENCOUNTER — Telehealth: Payer: Medicare Other

## 2021-06-16 ENCOUNTER — Inpatient Hospital Stay: Payer: Medicare Other | Attending: Internal Medicine

## 2021-06-16 ENCOUNTER — Encounter: Payer: Self-pay | Admitting: Internal Medicine

## 2021-06-16 ENCOUNTER — Other Ambulatory Visit: Payer: Self-pay

## 2021-06-16 ENCOUNTER — Inpatient Hospital Stay: Payer: Medicare Other | Admitting: Internal Medicine

## 2021-06-16 VITALS — BP 127/68 | HR 72 | Temp 96.4°F | Ht 63.0 in | Wt 183.0 lb

## 2021-06-16 DIAGNOSIS — I10 Essential (primary) hypertension: Secondary | ICD-10-CM | POA: Diagnosis not present

## 2021-06-16 DIAGNOSIS — K769 Liver disease, unspecified: Secondary | ICD-10-CM

## 2021-06-16 DIAGNOSIS — Z79899 Other long term (current) drug therapy: Secondary | ICD-10-CM | POA: Insufficient documentation

## 2021-06-16 DIAGNOSIS — K219 Gastro-esophageal reflux disease without esophagitis: Secondary | ICD-10-CM | POA: Diagnosis not present

## 2021-06-16 DIAGNOSIS — M2550 Pain in unspecified joint: Secondary | ICD-10-CM | POA: Insufficient documentation

## 2021-06-16 DIAGNOSIS — J45909 Unspecified asthma, uncomplicated: Secondary | ICD-10-CM | POA: Insufficient documentation

## 2021-06-16 DIAGNOSIS — C787 Secondary malignant neoplasm of liver and intrahepatic bile duct: Secondary | ICD-10-CM

## 2021-06-16 DIAGNOSIS — R5383 Other fatigue: Secondary | ICD-10-CM | POA: Insufficient documentation

## 2021-06-16 DIAGNOSIS — D539 Nutritional anemia, unspecified: Secondary | ICD-10-CM

## 2021-06-16 DIAGNOSIS — Z8349 Family history of other endocrine, nutritional and metabolic diseases: Secondary | ICD-10-CM | POA: Insufficient documentation

## 2021-06-16 DIAGNOSIS — Z596 Low income: Secondary | ICD-10-CM | POA: Diagnosis not present

## 2021-06-16 DIAGNOSIS — D1809 Hemangioma of other sites: Secondary | ICD-10-CM | POA: Insufficient documentation

## 2021-06-16 DIAGNOSIS — Z833 Family history of diabetes mellitus: Secondary | ICD-10-CM | POA: Insufficient documentation

## 2021-06-16 DIAGNOSIS — Z8249 Family history of ischemic heart disease and other diseases of the circulatory system: Secondary | ICD-10-CM | POA: Insufficient documentation

## 2021-06-16 DIAGNOSIS — D649 Anemia, unspecified: Secondary | ICD-10-CM | POA: Insufficient documentation

## 2021-06-16 LAB — CBC WITH DIFFERENTIAL/PLATELET
Abs Immature Granulocytes: 0.03 10*3/uL (ref 0.00–0.07)
Basophils Absolute: 0.1 10*3/uL (ref 0.0–0.1)
Basophils Relative: 2 %
Eosinophils Absolute: 0.2 10*3/uL (ref 0.0–0.5)
Eosinophils Relative: 4 %
HCT: 35 % — ABNORMAL LOW (ref 36.0–46.0)
Hemoglobin: 11.6 g/dL — ABNORMAL LOW (ref 12.0–15.0)
Immature Granulocytes: 1 %
Lymphocytes Relative: 33 %
Lymphs Abs: 1.8 10*3/uL (ref 0.7–4.0)
MCH: 35 pg — ABNORMAL HIGH (ref 26.0–34.0)
MCHC: 33.1 g/dL (ref 30.0–36.0)
MCV: 105.7 fL — ABNORMAL HIGH (ref 80.0–100.0)
Monocytes Absolute: 0.5 10*3/uL (ref 0.1–1.0)
Monocytes Relative: 8 %
Neutro Abs: 2.9 10*3/uL (ref 1.7–7.7)
Neutrophils Relative %: 52 %
Platelets: 164 10*3/uL (ref 150–400)
RBC: 3.31 MIL/uL — ABNORMAL LOW (ref 3.87–5.11)
RDW: 14.2 % (ref 11.5–15.5)
Smear Review: NORMAL
WBC: 5.5 10*3/uL (ref 4.0–10.5)
nRBC: 0 % (ref 0.0–0.2)

## 2021-06-16 LAB — COMPREHENSIVE METABOLIC PANEL
ALT: 17 U/L (ref 0–44)
AST: 15 U/L (ref 15–41)
Albumin: 4 g/dL (ref 3.5–5.0)
Alkaline Phosphatase: 67 U/L (ref 38–126)
Anion gap: 8 (ref 5–15)
BUN: 17 mg/dL (ref 8–23)
CO2: 32 mmol/L (ref 22–32)
Calcium: 9.4 mg/dL (ref 8.9–10.3)
Chloride: 101 mmol/L (ref 98–111)
Creatinine, Ser: 0.77 mg/dL (ref 0.44–1.00)
GFR, Estimated: >60 mL/min (ref >60)
Glucose, Bld: 113 mg/dL — ABNORMAL HIGH (ref 70–99)
Potassium: 3.9 mmol/L (ref 3.5–5.1)
Sodium: 141 mmol/L (ref 135–145)
Total Bilirubin: 0.5 mg/dL (ref 0.3–1.2)
Total Protein: 7.2 g/dL (ref 6.5–8.1)

## 2021-06-16 LAB — LACTATE DEHYDROGENASE: LDH: 128 U/L (ref 98–192)

## 2021-06-16 NOTE — Assessment & Plan Note (Addendum)
#  Mild macrocytic anemia/thrombocytopenia-hemoglobin around 11-12; platelets 127-140s.  Normal white count.  Today hemoglobin is 11.6 platelets 160.  #Overall anemia/thrombocytopenia intermittent/stable; no significant worsening over the last 4 months also. Given the slight improvement/lack of worsening of the blood work I think is reasonable to hold off a bone marrow biopsy at this time; I will reevaluate the blood counts in 4 months.  If worsening would recommend a bone marrow biopsy.   # Incidental cysts in liver/US [OCT 2022-]: 13 mm hypoechoic mass within the liver.  Low clinical concern for metastatic disease; however need to rule out primary [place] versus others.  There are 3 small liver lesions, 2 of which represent simple cysts. 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 Repeat abdominal MRI in 3 months from now-to confirm the stability.  If stable would recommend stop surveillance MRI.  # DISPOSITION:  # Follow up in 3 months-  MD ; cbc/cmp/LDH; MRI liver prior-- Dr.B

## 2021-06-16 NOTE — Progress Notes (Signed)
Dr. Derrel Nip did some lab work 06/07/2021, please review.

## 2021-06-16 NOTE — Progress Notes (Signed)
[facility] NOTE  Patient Care Team: Crecencio Mc, MD as PCP - General (Internal Medicine) Crecencio Mc, MD (Internal Medicine) Bary Castilla Forest Gleason, MD (General Surgery) De Hollingshead, RPH-CPP (Pharmacist) Cammie Sickle, MD as Consulting Physician (Hematology and Oncology)  CHIEF COMPLAINTS/PURPOSE OF CONSULTATION: ANEMIA   HEMATOLOGY HISTORY:  # MACROCYTIC ANEMIA [since 2022- April hb 11; MCV-103; platelet- 120s] EGD/ colonoscopy >>> 10 years ago; OCT 2022-slight improvement blood counts/stable; hepatitis HIV negative.  Myeloma panel negative.  HOLD off  bone marrow biopsy;  #Incidental right hepatic lobe 13 mm hypoechoic lesion [ultrasound-October 2022]-MRI OCT 2022-   Low clinical concern for metastatic disease; however need to rule out primary [place] versus others.  1. There are 3 small liver lesions, 2 of which represent simple cysts. 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 Repeat abdominal MRI   #October 2022-ultrasound incidental bilateral mild to moderate hydronephrosis.-Rs/p evaluation with [facility].   HISTORY OF PRESENTING ILLNESS: Ambulating independently.  Accompanied by her friend  Sarah [name] 71 y.o.  female is here for follow-up today with the blood work ultrasound that was ordered for patient's  anemia/thrombocytopenia/MRI abdomen.  Denies any blood in stools or black-colored stools but denies any nausea vomiting.  Chronic mild fatigue.  Chronic mild easy bruising.  No tremors.  Review of Systems  Constitutional:  Positive for malaise/fatigue. Negative for chills, diaphoresis, fever and weight loss.  HENT:  Negative for nosebleeds and sore throat.   Eyes:  Negative for double vision.  Respiratory:  Negative for cough, hemoptysis, sputum production, shortness of breath and wheezing.   Cardiovascular:  Negative for chest pain, palpitations,  orthopnea and leg swelling.  Gastrointestinal:  Negative for abdominal pain, blood in stool, constipation, diarrhea, heartburn, melena, nausea and vomiting.  Genitourinary:  Negative for dysuria, frequency and urgency.  Musculoskeletal:  Positive for joint pain. Negative for back pain.  Skin: Negative.  Negative for itching and rash.  Neurological:  Negative for dizziness, tingling, focal weakness, weakness and headaches.  Endo/Heme/Allergies:  Bruises/bleeds easily.  Psychiatric/Behavioral:  Negative for depression. The patient is not nervous/anxious and does not have insomnia.    MEDICAL HISTORY:  Past Medical History:  Diagnosis Date   Arthritis    Asthma    Depression    Dyspnea    with heat   Environmental and seasonal allergies    GERD (gastroesophageal reflux disease)    Hypertension    Presence of dental prosthetic device    Dental implants   Wheezing     SURGICAL HISTORY: Past Surgical History:  Procedure Laterality Date   ABDOMINAL HYSTERECTOMY  1989   BROW LIFT Bilateral 02/07/2020   Procedure: BLEPHAROPLASTY UPPER EYELID; W/EXCESS SKIN BROW PTOSIS REPAIR BILATERAL;  Surgeon: Karle Starch, MD;  Location: [facility];  Service: Ophthalmology;  Laterality: Bilateral;   CATARACT EXTRACTION W/PHACO Right 09/13/2016   Procedure: CATARACT EXTRACTION PHACO AND INTRAOCULAR LENS PLACEMENT (IOC);  Surgeon: Birder Robson, MD;  Location: [facility];  Service: Ophthalmology;  Laterality: Right;  [place] 00:38 AP% 14.3 CDE 5.51 Fluid pack lot # [id number]   CATARACT EXTRACTION W/PHACO Left 10/11/2016   Procedure: CATARACT EXTRACTION PHACO AND INTRAOCULAR LENS PLACEMENT (IOC);  Surgeon: Birder Robson, MD;  Location: [facility];  Service: Ophthalmology;  Laterality: Left;  [place] 00:30 AP% 11.3 CDE 3.50 fluid pack lot # [id number]   COLONOSCOPY WITH PROPOFOL N/A 09/30/2014   Procedure: COLONOSCOPY  WITH PROPOFOL;  Surgeon: Robert Bellow, MD;  Location: [facility] END[facility]COPY;  Service:  Endoscopy;  Laterality: N/A;   COLONOSCOPY WITH PROPOFOL N/A 03/24/2021   Procedure: COLONOSCOPY WITH PROPOFOL;  Surgeon: Lin Landsman, MD;  Location: [facility] END[facility]COPY;  Service: Gastroenterology;  Laterality: N/A;   ESOPHAGOGASTRODUODENOSCOPY N/A 09/30/2014   Procedure: ESOPHAGOGASTRODUODENOSCOPY (EGD);  Surgeon: Robert Bellow, MD;  Location[facility] ENDOSCOPY;  Service: Endoscopy;  Laterality: N/A;   ESOPHAGOGASTRODUODENOSCOPY N/A 03/24/2021   Procedure: ESOPHAGOGASTRODUODENOSCOPY (EGD);  Surgeon: Lin Landsman, MD;  Location: [facility] ENDOSCOPY;  Service: Gastroenterology;  Laterality: N/A;   FOOT SURGERY Left 1985   KNEE SURGERY  2016    SOCIAL HISTORY: Social History   Socioeconomic History   Marital status: Single    Spouse name: Not on file   Number of children: Not on file   Years of education: Not on file   Highest education level: Not on file  Occupational History   Not on file  Tobacco Use   Smoking status: Never   Smokeless tobacco: Never  Vaping Use   Vaping Use: Never used  Substance and Sexual Activity   Alcohol use: No   Drug use: No   Sexual activity: Never  Other Topics Concern   Not on file  Social History Narrative   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 No smoking; no alcohol. Lives in [place], [place] lives self/kitty.    Social Determinants of Health   Financial Resource Strain: Medium Risk   Difficulty of Paying Living Expenses: Somewhat hard  Food Insecurity: No Food Insecurity   Worried About Charity fundraiser in the Last Year: Never true   Ran Out of Food in the Last Year: Never true  Transportation Needs: No Transportation Needs   Lack of Transportation (Medical): No   Lack of Transportation (Non-Medical): No  Physical Activity: Not on file  Stress: No Stress Concern Present   Feeling of Stress : Not at all  Social Connections: Unknown   Frequency of Communication with Friends and Family: More than three times a week    Frequency of Social Gatherings with Friends and Family: More than three times a week   Attends Religious Services: More than 4 times per year   Active Member of [employer] or Organizations: Yes   Attends Music therapist: More than 4 times per year   Marital Status: Not on file  Intimate Partner Violence: Not At Risk   Fear of Current or Ex-Partner: No   Emotionally Abused: No   Physically Abused: No   Sexually Abused: No    FAMILY HISTORY: Family History  Problem Relation Age of Onset   Diabetes Mother    Hyperlipidemia Mother    Heart disease Mother    Prostate cancer Neg Hx    Kidney cancer Neg Hx    Bladder Cancer Neg Hx     ALLERGIES:  has No Known Allergies.  MEDICATIONS:  Current Outpatient Medications  Medication Sig Dispense Refill   albuterol (VENTOLIN HFA) 108 (90 Base) MCG/ACT inhaler Inhale 2 puffs into the lungs every 6 (six) hours as needed for wheezing or shortness of breath. 3 each 3   amLODipine (NORVASC) 5 MG tablet Take 1 tablet (5 mg total) by mouth daily. 90 tablet 3   budesonide (PULMICORT) 0.25 MG/2ML nebulizer solution Take 2 mLs (0.25 mg total) by nebulization 2 (two) times daily. 60 mL 12   Calcium Citrate-Vitamin D (CALCIUM CITRATE + D PO) Take 1 tablet by  mouth 2 (two) times daily.     citalopram (CELEXA) 20 MG tablet Take 1 tablet (20 mg total) by mouth daily. 90 tablet 3   famotidine (PEPCID) 20 MG tablet TAKE 1 TABLET BY MOUTH DAILY. BEFORE DINNER 30 tablet 11   fexofenadine (ALLEGRA) 180 MG tablet Take 180 mg by mouth daily.     Fluticasone-Umeclidin-Vilant (TRELEGY ELLIPTA) 100-62.5-25 MCG/INH AEPB Inhale 1 puff into the lungs daily. 180 each 3   gabapentin (NEURONTIN) 100 MG capsule Take 1 capsule (100 mg total) by mouth 3 (three) times daily. (Patient taking differently: Take 100 mg by mouth daily.) 270 capsule 3   ipratropium-albuterol (DUONEB) 0.5-2.5 (3) MG/3ML SOLN Take 3 mLs by nebulization every 6 (six) hours as needed. 360 mL 2    levothyroxine (SYNTHROID) 50 MCG tablet TAKE 1 TABLET EVERY DAY ON EMPTY STOMACHWITH A GLASS OF WATER AT LEAST 30-60 MINBEFORE BREAKFAST 90 tablet 3   mirabegron ER (MYRBETRIQ) 50 MG TB24 tablet Take 1 tablet (50 mg total) by mouth daily. 30 tablet 5   pantoprazole (PROTONIX) 40 MG tablet Take 1 tablet (40 mg total) by mouth daily. 90 tablet 3   traZODone (DESYREL) 50 MG tablet Take 0.5-1 tablets (25-50 mg total) by mouth at bedtime as needed for sleep. 90 tablet 1   vitamin B-12 (CYANOCOBALAMIN) 1000 MCG tablet Take 1,000 mcg by mouth daily.     No current facility-administered medications for this visit.      PHYSICAL EXAMINATION:   Vitals:   06/16/21 0940  BP: 127/68  Pulse: 72  Temp: (!) 96.4 F (35.8 C)  SpO2: 97%   Filed Weights   06/16/21 0940  Weight: 183 lb (83 kg)    Physical Exam Vitals and nursing note reviewed.  HENT:     Head: Normocephalic and atraumatic.     Mouth/Throat:     Pharynx: Oropharynx is clear.  Eyes:     Extraocular Movements: Extraocular movements intact.     Pupils: Pupils are equal, round, and reactive to light.  Cardiovascular:     Rate and Rhythm: Normal rate and regular rhythm.  Pulmonary:     Comments: Decreased breath sounds bilaterally.  Abdominal:     Palpations: Abdomen is soft.  Musculoskeletal:        General: Normal range of motion.     Cervical back: Normal range of motion.  Skin:    General: Skin is warm.  Neurological:     General: No focal deficit present.     Mental Status: She is alert and oriented to person, place, and time.  Psychiatric:        Behavior: Behavior normal.        Judgment: Judgment normal.    LABORATORY DATA:  I have reviewed the data as listed Lab Results  Component Value Date   WBC 5.5 06/16/2021   HGB 11.6 (L) 06/16/2021   HCT 35.0 (L) 06/16/2021   MCV 105.7 (H) 06/16/2021   PLT 164 06/16/2021   Recent Labs    01/27/21 1518 06/07/21 1442 06/16/21 0923  NA 137 141 141  K 4.0  4.0 3.9  CL 101 103 101  CO2 29 31 32  GLUCOSE 94 87 113*  BUN $Re'15 18 17  'Ftm$ CREATININE 0.75 0.87 0.77  CALCIUM 9.1 9.6 9.4  GFRNONAA >60  --  >60  PROT 7.5 7.2 7.2  ALBUMIN 4.3 4.6 4.0  AST $Re'29 12 15  'ZHS$ ALT 38 12 17  ALKPHOS 77 78 67  BILITOT  0.7 0.5 0.5     No results found.  Macrocytic anemia #Mild macrocytic anemia/thrombocytopenia-hemoglobin around 11-12; platelets 127-140s.  Normal white count.  Today hemoglobin is 11.6 platelets 160.  #Overall anemia/thrombocytopenia intermittent/stable; no significant worsening over the last 4 months also. Given the slight improvement/lack of worsening of the blood work I think is reasonable to hold off a bone marrow biopsy at this time; I will reevaluate the blood counts in 4 months.  If worsening would recommend a bone marrow biopsy.   # Incidental cysts in liver/US [OCT 2022-]: 13 mm hypoechoic mass within the liver.  Low clinical concern for metastatic disease; however need to rule out primary [place] versus others.  There are 3 small liver lesions, 2 of which represent simple cysts. 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 Repeat abdominal MRI in 3 months from now-to confirm the stability.  If stable would recommend stop surveillance MRI.  # DISPOSITION:  # Follow up in 3 months-  MD ; cbc/cmp/LDH; MRI liver prior-- Dr.B        All questions were answered. The patient knows to call the clinic with any problems, questions or concerns.      Cammie Sickle, MD 06/16/2021 10:54 AM

## 2021-06-18 ENCOUNTER — Ambulatory Visit (INDEPENDENT_AMBULATORY_CARE_PROVIDER_SITE_OTHER): Payer: Medicare Other | Admitting: Pharmacist

## 2021-06-18 DIAGNOSIS — J4540 Moderate persistent asthma, uncomplicated: Secondary | ICD-10-CM

## 2021-06-18 DIAGNOSIS — E785 Hyperlipidemia, unspecified: Secondary | ICD-10-CM

## 2021-06-18 MED ORDER — TRELEGY ELLIPTA 100-62.5-25 MCG/ACT IN AEPB: 1.0000 | INHALATION_SPRAY | Freq: Every day | RESPIRATORY_TRACT | 11 refills | Status: DC

## 2021-06-18 MED ORDER — ROSUVASTATIN CALCIUM 10 MG PO TABS: 10.0000 mg | ORAL_TABLET | Freq: Every day | ORAL | 3 refills | Status: DC

## 2021-06-18 NOTE — Patient Instructions (Signed)
[facility],   It was great talking to you today!  Start rosuvastatin 10 mg daily. This medication is generally well tolerated, though some people experience muscle aches/pains. If you develop any of these symptoms after starting, please give [place] a call.   We typically check liver enzymes after starting this medication, but Dr. B is already scheduled to check these for you in about 6 weeks.   We'll recheck your cholesterol before you see Dr. Derrel Nip at the end of May.   Before we can reapply for Trelegy assistance through the manufacturer ([place]), you have to have spent at least $600 on prescription copays at the pharmacy this calendar year. Keep an eye on that drug spend.   Take care!  [name], PharmD  Visit Information  Following are the goals we discussed today:  Patient Goals/Self-Care Activities Over the next 90 days, patient will:  - take medications as prescribed collaborate with provider on medication access solutions          Plan: Telephone follow up appointment with care management team member scheduled for:  5 months   [name], PharmD, [place], CPP Clinical Pharmacist [place] [facility] [phone]   Please call the care guide team at [phone] if you need to cancel or reschedule your appointment.   The patient verbalized understanding of instructions, educational materials, and care plan provided today and agreed to receive a mailed copy of patient instructions, educational materials, and care plan.

## 2021-06-18 NOTE — Chronic Care Management (AMB) (Signed)
Chronic Care Management CCM Pharmacy Note  06/18/2021 Name:  Sarah [name] MRN:  [id number] DOB:  Jan 29, 1951  Summary: - Lipids uncontrolled.   Recommendations/Changes made from today's visit: - Start rosuvastatin 10 mg daily. Liver enzymes per currently scheduled BMP per Dr. B in ~ 6 weeks  - Reviewed re-enrollment procedure for Trelegy   Subjective: Sarah [name] is an 71 y.o. year old female who is a primary patient of Derrel Nip, Aris Everts, MD.  The CCM team was consulted for assistance with disease management and care coordination needs.    Engaged with patient by telephone for follow up visit for pharmacy case management and/or care coordination services.   Objective:  Medications Reviewed Today     Reviewed by De Hollingshead, RPH-CPP (Pharmacist) on 06/18/21 at 1451  Med List Status: <None>   Medication Order Taking? Sig Documenting Provider Last Dose Status Informant  albuterol (VENTOLIN HFA) 108 (90 Base) MCG/ACT inhaler [id number] No Inhale 2 puffs into the lungs every 6 (six) hours as needed for wheezing or shortness of breath.  Patient not taking: Reported on 06/18/2021   Crecencio Mc, MD Not Taking Active   amLODipine (NORVASC) 5 MG tablet [id number] Yes Take 1 tablet (5 mg total) by mouth daily. Crecencio Mc, MD Taking Active   budesonide (PULMICORT) 0.25 MG/2ML nebulizer solution [id number] No Take 2 mLs (0.25 mg total) by nebulization 2 (two) times daily.  Patient not taking: Reported on 06/18/2021   McLean-Scocuzza, Nino Glow, MD Not Taking Active   Calcium Citrate-Vitamin D (CALCIUM CITRATE + D PO) [id number] Yes Take 1 tablet by mouth 2 (two) times daily. [provider] Taking Active   citalopram (CELEXA) 20 MG tablet [id number] Yes Take 1 tablet (20 mg total) by mouth daily. Crecencio Mc, MD Taking Active   famotidine (PEPCID) 20 MG tablet [id number] Yes TAKE 1 TABLET BY MOUTH DAILY. BEFORE DINNER Crecencio Mc, MD Taking Active   fexofenadine  [address] [facility]) 180 MG tablet [id number] Yes Take 180 mg by mouth daily. [provider] Taking Active Self  gabapentin (NEURONTIN) 100 MG capsule [id number] Yes Take 1 capsule (100 mg total) by mouth 3 (three) times daily.  Patient taking differently: Take 100 mg by mouth daily.   Crecencio Mc, MD Taking Active   ipratropium-albuterol (DUONEB) 0.5-2.5 (3) MG/3ML SOLN [id number] Yes Take 3 mLs by nebulization every 6 (six) hours as needed. McLean-Scocuzza, Nino Glow, MD Taking Active   levothyroxine (SYNTHROID) 50 MCG tablet [id number] Yes TAKE 1 TABLET EVERY DAY ON EMPTY STOMACHWITH A GLASS OF WATER AT LEAST 30-60 MINBEFORE BREAKFAST Crecencio Mc, MD Taking Active   mirabegron ER (MYRBETRIQ) 50 MG TB24 tablet [id number] Yes Take 1 tablet (50 mg total) by mouth daily. Crecencio Mc, MD Taking Active   pantoprazole (PROTONIX) 40 MG tablet [id number] Yes Take 1 tablet (40 mg total) by mouth daily. Crecencio Mc, MD Taking Active   traZODone (DESYREL) 50 MG tablet [id number] Yes Take 0.5-1 tablets (25-50 mg total) by mouth at bedtime as needed for sleep. Crecencio Mc, MD Taking Active   vitamin B-12 (CYANOCOBALAMIN) 1000 MCG tablet [id number] Yes Take 1,000 mcg by mouth daily. [provider] Taking Active             Pertinent Labs:   Lab Results  Component Value Date   HGBA1C 6.0 06/07/2021   Lab Results  Component Value Date   CHOL 244 (H) 06/07/2021   HDL 71.40 06/07/2021  LDLCALC 151 (H) 06/07/2021   TRIG 106.0 06/07/2021   CHOLHDL 3 06/07/2021   Lab Results  Component Value Date   CREATININE 0.77 06/16/2021   BUN 17 06/16/2021   NA 141 06/16/2021   K 3.9 06/16/2021   CL 101 06/16/2021   CO2 32 06/16/2021    SDOH:  (Social Determinants of Health) assessments and interventions performed:  SDOH Interventions    Flowsheet Row Most Recent Value  SDOH Interventions   Financial Strain Interventions Other (Comment)  [manufacturer assistance]       CCM Care  Plan  Review of patient past medical history, allergies, medications, health status, including review of consultants reports, laboratory and other test data, was performed as part of comprehensive evaluation and provision of chronic care management services.   Care Plan : Medication Management  Updates made by De Hollingshead, RPH-CPP since 06/18/2021 12:00 AM     Problem: Asthma, Neuropathy, HTN      Long-Range Goal: Disease Progression Prevention   Start Date: 07/15/2020  Recent Progress: On track  Priority: High  Note:   Current Barriers:  Unable to independently afford treatment regimen  Pharmacist Clinical Goal(s):  Over the next 90 days, patient will verbalize ability to afford treatment regimen Over the next 90 days, patient will achieve adherence to monitoring guidelines and medication adherence to achieve therapeutic efficacy through collaboration with PharmD and provider.   Interventions: 1:1 collaboration with Crecencio Mc, MD regarding development and update of comprehensive plan of care as evidenced by provider attestation and co-signature Inter-disciplinary care team collaboration (see longitudinal plan of care) Comprehensive medication review performed; medication list updated in electronic medical record  Health Maintenance   Yearly influenza vaccination: up to date  Td/Tdap vaccination: up to date Pneumonia vaccination: up to date COVID vaccinations: up to date  Shingrix vaccinations: up to date Colonoscopy: up to date  Bone density scan: up to date Mammogram: up to date  Hypertension: Controlled per last in office reading; current treatment: amlodipine 5 mg daily Previously recommended to continue current regimen at this time  Hyperlipidemia: Uncontrolled; current treatment: none  Medications previously tried: none 10 year ASCVD risk score: 13% Counseled on statin therapy. Patient amenable. Start rosuvastatin 10 mg daily, script sent to mail order.  Patient has CMP scheduled in ~6 weeks with hematology, so will avoid scheduling separate LFTs. Scheduled follow up with PCP in ~ 3 months with fasting labs before for lipids.   Asthma: Controlled per patient report; current treatment: Trelegy 100/25/62.5 mcg 1 puff daily; albuterol HFA PRN- rarely using; budesonide per nebulizer - rarely using Reviewed that she has to meet $600 out of pocket spend prior to re-enrolling for assistance in 2023.  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 Recommended to continue current regimen at this time  Depression Controlled per patient report; current treatment: citalopram 20 mg daily Previously recommended to continue current regimen at this time  GERD: Controlled per patient report; pantoprazole 40 mg QAM, famotidine 20 mg QPM  Previously recommended to continue current regimen at this time, avoid trigger foods. Continue to collaborate with GI.   Osteopenia: Current treatment: calcium citrate 600 mg + vitamin D 2 tablets daily. No regular dairy sources Last DEXA 09/2014: t score -2.2 at spine, -2.0 at femur; FRAX risk of major osteoporotic fracture 17.2%, hip fracture 2.7% Previously recommended to continue current regimen at this time. Moving forward, consider updated DEXA, especially given need for  periodic steroid use for asthma.  Neuropathy/Osteoarthritis Pain: Improved per patient report; current regimen: gabapentin 100 mg TID, though often just taking BID, taking acetaminophen 500 mg PRN (has switched away from ibuprofen) Previously recommended to continue current regimen at this time   Hypothyroidism: Controlled per last lab work, current regimen: levothyroxine 50 mcg daily Previously recommended to continue current regimen at this time  Supplements: Vitamin B12 1000 mcg daily, calcium citrate 600 mg + vitamin D as above   Patient Goals/Self-Care Activities Over  the next 90 days, patient will:  - take medications as prescribed collaborate with provider on medication access solutions       Plan: Telephone follow up appointment with care management team member scheduled for:  5 months  [name], PharmD, [place], [place] Clinical Pharmacist [employer] at [employer] [phone]

## 2021-06-22 DIAGNOSIS — J4540 Moderate persistent asthma, uncomplicated: Secondary | ICD-10-CM

## 2021-06-22 DIAGNOSIS — E785 Hyperlipidemia, unspecified: Secondary | ICD-10-CM

## 2021-07-01 ENCOUNTER — Ambulatory Visit: Payer: Self-pay | Admitting: Pharmacist

## 2021-07-01 NOTE — Patient Instructions (Signed)
Hi Sarah [name],  ? ?I am being asked to quickly transition into another role within the health system, so unfortunately I am unable to keep our next appointment. Please continue to follow up with your primary care provider as scheduled.  ? ?It has been a pleasure working with you! ? ?[name], PharmD ? ?

## 2021-07-01 NOTE — Chronic Care Management (AMB) (Signed)
?  Chronic Care Management  ? ?Note ? ?07/01/2021 ?Name: AIANNA FAHS MRN: [id number] DOB: January 22, 1951 ? ? ? ?Closing pharmacy CCM case at this time. Patient has clinic contact information for future questions or concerns.  ? ?[name], PharmD, [place], CPP ?Clinical Pharmacist ?Therapist, music at [employer] ?[phone] ? ?

## 2021-07-05 DIAGNOSIS — H524 Presbyopia: Secondary | ICD-10-CM | POA: Diagnosis not present

## 2021-07-23 ENCOUNTER — Telehealth: Payer: Self-pay | Admitting: Internal Medicine

## 2021-07-23 NOTE — Telephone Encounter (Signed)
Pt called in requesting for refill on medication (predniSONE (DELTASONE) 20 MG tablet)... Advise pt that this medication is discontinued and she may need to schedule a follow up appt... Pt understood... Pt complaining of neck pain... Pt requesting call back...  ? ?

## 2021-07-26 NOTE — Telephone Encounter (Signed)
As long as pt is aware that the appt is just to discuss the neck pain then no 15 minutes will be fine.  ?

## 2021-07-26 NOTE — Telephone Encounter (Signed)
Pt was called transfer by access nurse to our office... Pt stated that access nurse advise her that she needed to be seen within 3 days... Schedule Pt on 07/27/2021 at 10:30am... Do Dr. Derrel Nip need more time then just 15 mins with pt...  ?

## 2021-07-27 ENCOUNTER — Encounter: Payer: Self-pay | Admitting: Internal Medicine

## 2021-07-27 ENCOUNTER — Ambulatory Visit (INDEPENDENT_AMBULATORY_CARE_PROVIDER_SITE_OTHER): Payer: Medicare Other | Admitting: Internal Medicine

## 2021-07-27 ENCOUNTER — Telehealth: Payer: Self-pay | Admitting: *Deleted

## 2021-07-27 VITALS — BP 144/66 | HR 88 | Temp 97.6°F | Ht 63.0 in | Wt 182.0 lb

## 2021-07-27 DIAGNOSIS — M542 Cervicalgia: Secondary | ICD-10-CM | POA: Diagnosis not present

## 2021-07-27 MED ORDER — PREDNISONE 10 MG PO TABS: ORAL_TABLET | ORAL | 0 refills | Status: DC

## 2021-07-27 MED ORDER — TIZANIDINE HCL 4 MG PO TABS: 4.0000 mg | ORAL_TABLET | Freq: Four times a day (QID) | ORAL | 0 refills | Status: DC | PRN

## 2021-07-27 MED ORDER — KETOROLAC TROMETHAMINE 60 MG/2ML IM SOLN
60.0000 mg | Freq: Once | INTRAMUSCULAR | Status: AC
  Administered 2021-07-27: 60 mg via INTRAMUSCULAR

## 2021-07-27 NOTE — Assessment & Plan Note (Signed)
Secondary to improper lifting of 25 lb batter resulting in trapezius strain .  toradol dose IM given in office today ;  Prednisone taper starting tomorrow if pain persists.  Continue tylenol an dheat,  And  add muscle relaxer  ?

## 2021-07-27 NOTE — Patient Instructions (Signed)
You received 60 mg dose of Toradol today.  This is a POWERFUL ANTI INFLAMMATORY.  Gage . DO NOT STOP TO PICK UP YOUR MEDICATIONS BECAUSE YOU MAY BECOME SLEEPY ? ?DO NOT START THE PREDNISONE UNTIL TOMORROW,  OR YOU MAY SAVE IT FOR THE NEXT OCCURRENCE  IF YOUR PAIN IS GONE ? ?USE TYLENOL 1000 G EVERY 12 HOURS  AND TAKE THE MUSCLE RELAXER EVERY 8 HOURS  (USE 2 MG DURING THE DAY AND 4 MG AT NIGHT) ? ?CONTINUE APPLYING HEART FOR 15 MINUTES EVERY FEW HOURS  ?

## 2021-07-27 NOTE — Progress Notes (Signed)
? ?Subjective:  ?Patient ID: Sarah [name], female    DOB: Jun 14, 1950  Age: 71 y.o. MRN: [id number] ? ?CC: The primary encounter diagnosis was Neck pain on right side. A diagnosis of Acute neck pain was also pertinent to this visit. ? ? ?This visit occurred during the SARS-CoV-2 public health emergency.  Safety protocols were in place, including screening questions prior to the visit, additional usage of staff PPE, and extensive cleaning of exam room while observing appropriate contact time as indicated for disinfecting solutions.   ? ?HPI ?Sarah [name] presents for evaluation of r ight sided neck pain  ?Chief Complaint  ?Patient presents with  ? Neck Pain  ?  Pt stated that last Tuesday she started having neck pain after lifting a battery out of a lawn mower.   ? 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  Her neck  Started hurting several hours after the lifting episode.  She has been been using ES tylenol and heat.  The pain localizes to the rigtht  occipital region behind her right ear.  The pain does not radiate to right arm and she denies any numbness, tingling or loss of dexterity ? ? ?Outpatient Medications Prior to Visit  ?Medication Sig Dispense Refill  ? albuterol (VENTOLIN HFA) 108 (90 Base) MCG/ACT inhaler Inhale 2 puffs into the lungs every 6 (six) hours as needed for wheezing or shortness of breath. 3 each 3  ? amLODipine (NORVASC) 5 MG tablet Take 1 tablet (5 mg total) by mouth daily. 90 tablet 3  ? budesonide (PULMICORT) 0.25 MG/2ML nebulizer solution Take 2 mLs (0.25 mg total) by nebulization 2 (two) times daily. 60 mL 12  ? Calcium Citrate-Vitamin D (CALCIUM CITRATE + D PO) Take 1 tablet by mouth 2 (two) times daily.    ? citalopram (CELEXA) 20 MG tablet Take 1 tablet (20 mg total)  by mouth daily. 90 tablet 3  ? famotidine (PEPCID) 20 MG tablet TAKE 1 TABLET BY MOUTH DAILY. BEFORE DINNER 30 tablet 11  ? fexofenadine (ALLEGRA) 180 MG tablet Take 180 mg by mouth daily.    ? Fluticasone-Umeclidin-Vilant (TRELEGY ELLIPTA) 100-62.5-25 MCG/ACT AEPB Inhale 1 puff into the lungs daily. 1 each 11  ? gabapentin (NEURONTIN) 100 MG capsule Take 1 capsule (100 mg total) by mouth 3 (three) times daily. (Patient taking differently: Take 100 mg by mouth daily.) 270 capsule 3  ? ipratropium-albuterol (DUONEB) 0.5-2.5 (3) MG/3ML SOLN Take 3 mLs by nebulization every 6 (six) hours as needed. 360 mL 2  ? levothyroxine (SYNTHROID) 50 MCG tablet TAKE 1 TABLET EVERY DAY ON EMPTY STOMACHWITH A GLASS OF WATER AT LEAST 30-60 MINBEFORE BREAKFAST 90 tablet 3  ? mirabegron ER (MYRBETRIQ) 50 MG TB24 tablet Take 1 tablet (50 mg total) by mouth daily. 30 tablet 5  ? pantoprazole (PROTONIX) 40 MG tablet Take 1 tablet (40 mg total) by mouth daily. 90 tablet 3  ? rosuvastatin (CRESTOR) 10 MG tablet Take 1 tablet (10 mg total) by mouth daily. 90 tablet 3  ? traZODone (DESYREL) 50 MG tablet Take 0.5-1 tablets (25-50 mg total) by mouth at bedtime as needed for sleep. 90 tablet 1  ? vitamin B-12 (CYANOCOBALAMIN) 1000 MCG tablet Take 1,000 mcg by mouth daily.    ? ?  No facility-administered medications prior to visit.  ? ? ?Review of Systems; ? ?Patient denies headache, fevers, malaise, unintentional weight loss, skin rash, eye pain, sinus congestion and sinus pain, sore throat, dysphagia,  hemoptysis , cough, dyspnea, wheezing, chest pain, palpitations, orthopnea, edema, abdominal pain, nausea, melena, diarrhea, constipation, flank pain, dysuria, hematuria, urinary  Frequency, nocturia, numbness, tingling, seizures,  Focal weakness, Loss of consciousness,  Tremor, insomnia, depression, anxiety, and suicidal ideation.   ? ? ? ?Objective:  ?BP (!) 144/66 (BP Location: Left Arm, Patient Position: Sitting, Cuff Size: Normal)   Pulse 88    Temp 97.6 ?F (36.4 ?C) (Oral)   Ht '5\' 3"'$  (1.6 m)   Wt 182 lb (82.6 kg)   SpO2 97%   BMI 32.24 kg/m?  ? ?BP Readings from Last 3 Encounters:  ?07/27/21 (!) 144/66  ?06/16/21 127/68  ?06/07/21 138/68  ? ? ?Wt Readings from Last 3 Encounters:  ?07/27/21 182 lb (82.6 kg)  ?06/16/21 183 lb (83 kg)  ?06/07/21 182 lb 12.8 oz (82.9 kg)  ? ? ?General appearance: alert, cooperative and appears stated age ?Ears: normal TM's and external ear canals both ears ?Throat: lips, mucosa, and tongue normal; teeth and gums normal ?Neck: no adenopathy, no carotid bruit, supple, symmetrical, trachea midline and thyroid not enlarged, symmetric, tender over the right trapezius muscle AT ITS INSERTION  POINT ON THE MASTOID BONE. Back: symmetric, no curvature. ROM normal. No CVA tenderness. ?Lungs: clear to auscultation bilaterally ?Heart: regular rate and rhythm, S1, S2 normal, no murmur, click, rub or gallop ?Abdomen: soft, non-tender; bowel sounds normal; no masses,  no organomegaly ?Pulses: 2+ and symmetric ?Skin: Skin color, texture, turgor normal. No rashes or lesions ?Lymph nodes: Cervical, supraclavicular, and axillary nodes normal. ?Neuro: CNs 2-12 intact. DTRs 2+/4 in biceps, brachioradialis, . Muscle strength 5/5 in both arms,  proximally and distally .  ? ?Lab Results  ?Component Value Date  ? HGBA1C 6.0 06/07/2021  ? HGBA1C 6.4 12/10/2020  ? HGBA1C 6.0 06/12/2020  ? ? ?Lab Results  ?Component Value Date  ? CREATININE 0.77 06/16/2021  ? CREATININE 0.87 06/07/2021  ? CREATININE 0.75 01/27/2021  ? ? ?Lab Results  ?Component Value Date  ? WBC 5.5 06/16/2021  ? HGB 11.6 (L) 06/16/2021  ? HCT 35.0 (L) 06/16/2021  ? PLT 164 06/16/2021  ? GLUCOSE 113 (H) 06/16/2021  ? CHOL 244 (H) 06/07/2021  ? TRIG 106.0 06/07/2021  ? HDL 71.40 06/07/2021  ? LDLCALC 151 (H) 06/07/2021  ? ALT 17 06/16/2021  ? AST 15 06/16/2021  ? NA 141 06/16/2021  ? K 3.9 06/16/2021  ? CL 101 06/16/2021  ? CREATININE 0.77 06/16/2021  ? BUN 17 06/16/2021  ? CO2 32  06/16/2021  ? TSH 1.15 06/07/2021  ? HGBA1C 6.0 06/07/2021  ? MICROALBUR <0.7 06/07/2021  ? ? ?Mammogram 3D SCREEN BREAST BILATERAL ? ?Result Date: 05/10/2021 ?CLINICAL DATA:  Screening. EXAM: DIGITAL SCREENING BILATERAL MAMMOGRAM WITH TOMOSYNTHESIS AND CAD TECHNIQUE: Bilateral screening digital craniocaudal and mediolateral oblique mammograms were obtained. Bilateral screening digital breast tomosynthesis was performed. The images were evaluated with computer-aided detection. COMPARISON:  Previous exam(s). ACR Breast Density Category c: The breast tissue is heterogeneously dense, which may obscure small masses. FINDINGS: There are no findings suspicious for malignancy. IMPRESSION: No mammographic evidence of malignancy. A result letter of this screening mammogram will be mailed directly to the patient. RECOMMENDATION: Screening mammogram in one year. (Code:SM-B-01Y) BI-RADS CATEGORY  1: Negative. Electronically Signed   By: Lillia Mountain  M.D.   On: 05/10/2021 14:21 ? ? ?Assessment & Plan:  ? ?Problem List Items Addressed This Visit   ? ? Acute neck pain  ?  Secondary to improper lifting of 25 lb batter resulting in trapezius strain .  toradol dose IM given in office today ;  Prednisone taper starting tomorrow if pain persists.  Continue tylenol an dheat,  And  add muscle relaxer  ?  ?  ? ?Other Visit Diagnoses   ? ? Neck pain on right side    -  Primary  ? Relevant Medications  ? ketorolac (TORADOL) injection 60 mg  ? ?  ? ? ?Follow-up: No follow-ups on file. ? ? ?Crecencio Mc, MD ?

## 2021-07-27 NOTE — Telephone Encounter (Signed)
Called patient to follow up on call from Friday patient wanted to advise PCP that her neck is feeling better with the injection she was given that she has slept for several hours and feels much better. ?

## 2021-08-06 ENCOUNTER — Ambulatory Visit (INDEPENDENT_AMBULATORY_CARE_PROVIDER_SITE_OTHER): Payer: Medicare Other

## 2021-08-06 ENCOUNTER — Ambulatory Visit (INDEPENDENT_AMBULATORY_CARE_PROVIDER_SITE_OTHER): Payer: Medicare Other | Admitting: Internal Medicine

## 2021-08-06 ENCOUNTER — Encounter: Payer: Self-pay | Admitting: Internal Medicine

## 2021-08-06 VITALS — BP 134/70 | HR 74 | Temp 98.3°F | Resp 14 | Ht 63.0 in | Wt 185.4 lb

## 2021-08-06 DIAGNOSIS — R051 Acute cough: Secondary | ICD-10-CM | POA: Diagnosis not present

## 2021-08-06 DIAGNOSIS — M25511 Pain in right shoulder: Secondary | ICD-10-CM | POA: Diagnosis not present

## 2021-08-06 DIAGNOSIS — M542 Cervicalgia: Secondary | ICD-10-CM | POA: Diagnosis not present

## 2021-08-06 DIAGNOSIS — M62838 Other muscle spasm: Secondary | ICD-10-CM

## 2021-08-06 DIAGNOSIS — M791 Myalgia, unspecified site: Secondary | ICD-10-CM

## 2021-08-06 DIAGNOSIS — R059 Cough, unspecified: Secondary | ICD-10-CM | POA: Diagnosis not present

## 2021-08-06 DIAGNOSIS — R079 Chest pain, unspecified: Secondary | ICD-10-CM | POA: Diagnosis not present

## 2021-08-06 LAB — BASIC METABOLIC PANEL
BUN: 18 mg/dL (ref 7–25)
CO2: 26 mmol/L (ref 20–32)
Calcium: 9 mg/dL (ref 8.6–10.4)
Chloride: 105 mmol/L (ref 98–110)
Creat: 0.89 mg/dL (ref 0.60–1.00)
Glucose, Bld: 87 mg/dL (ref 65–99)
Potassium: 4.1 mmol/L (ref 3.5–5.3)
Sodium: 142 mmol/L (ref 135–146)

## 2021-08-06 LAB — CK TOTAL AND CKMB (NOT AT ARMC)
CK, MB: 0.7 ng/mL (ref 0–5.0)
Relative Index: 1.7 (ref 0–4.0)
Total CK: 42 U/L (ref 29–143)

## 2021-08-06 LAB — D-DIMER, QUANTITATIVE: D-Dimer, Quant: 0.35 mcg/mL FEU (ref <0.50)

## 2021-08-06 LAB — MAGNESIUM: Magnesium: 2.1 mg/dL (ref 1.5–2.5)

## 2021-08-06 LAB — TROPONIN I: Troponin I: <3 ng/L (ref <=47)

## 2021-08-06 MED ORDER — TRAMADOL HCL 50 MG PO TABS: 50.0000 mg | ORAL_TABLET | Freq: Two times a day (BID) | ORAL | 0 refills | Status: AC | PRN

## 2021-08-06 MED ORDER — TIZANIDINE HCL 4 MG PO TABS: 4.0000 mg | ORAL_TABLET | Freq: Two times a day (BID) | ORAL | 1 refills | Status: DC | PRN

## 2021-08-06 NOTE — Telephone Encounter (Signed)
Access nurse called and wanting to see if we have any appointments today. Pt is scheduled with Dr. Linus Orn at 3pm today ?

## 2021-08-06 NOTE — Telephone Encounter (Signed)
Patient arrived for appt

## 2021-08-06 NOTE — Patient Instructions (Addendum)
Try voltaren gel or aspercream over the counter  ? ?Stop crestor for now  ?Muscle Cramps and Spasms ?Muscle cramps and spasms occur when a muscle or muscles tighten and you have no control over this tightening (involuntary muscle contraction). They are a common problem and can develop in any muscle. The most common place is in the calf muscles of the leg. Muscle cramps and muscle spasms are both involuntary muscle contractions, but there are some differences between the two: ?Muscle cramps are painful. They come and go and may last for a few seconds or up to 15 minutes. Muscle cramps are often more forceful and last longer than muscle spasms. ?Muscle spasms may or may not be painful. They may also last just a few seconds or much longer. ?Certain medical conditions, such as diabetes or Parkinson's disease, can make it more likely to develop cramps or spasms. However, cramps or spasms are usually not caused by a serious underlying problem. Common causes include: ?Doing more physical work or exercise than your body is ready for (overexertion). ?Overuse from repeating certain movements too many times. ?Remaining in a certain position for a long period of time. ?Improper preparation, form, or technique while playing a sport or doing an activity. ?Dehydration. ?Injury. ?Side effects of some medicines. ?Abnormally low levels of the salts and minerals in your blood (electrolytes), especially potassium and calcium. This could happen if you are taking water pills (diuretics) or if you are pregnant. ?In many cases, the cause of muscle cramps or spasms is not known. ?Follow these instructions at home: ?Managing pain and stiffness ? ?  ? ?Try massaging, stretching, and relaxing the affected muscle. Do this for several minutes at a time. ?If directed, apply heat to tight or tense muscles as often as told by your health care provider. Use the heat source that your health care provider recommends, such as a moist heat pack or a  heating pad. ?Place a towel between your skin and the heat source. ?Leave the heat on for 20-30 minutes. ?Remove the heat if your skin turns bright red. This is especially important if you are unable to feel pain, heat, or cold. You may have a greater risk of getting burned. ?If directed, put ice on the affected area. This may help if you are sore or have pain after a cramp or spasm. ?Put ice in a plastic bag. ?Place a towel between your skin and the bag. ?Leave the ice on for 20 minutes, 2-3 times a day. ?Try taking hot showers or baths to help relax tight muscles. ?Eating and drinking ?Drink enough fluid to keep your urine pale yellow. Staying well hydrated may help prevent cramps or spasms. ?Eat a healthy diet that includes plenty of nutrients to help your muscles function. A healthy diet includes fruits and vegetables, lean protein, whole grains, and low-fat or nonfat dairy products. ?General instructions ?If you are having frequent cramps, avoid intense exercise for several days. ?Take over-the-counter and prescription medicines only as told by your health care provider. ?Pay attention to any changes in your symptoms. ?Keep all follow-up visits as told by your health care provider. This is important. ?Contact a health care provider if: ?Your cramps or spasms get more severe or happen more often. ?Your cramps or spasms do not improve over time. ?Summary ?Muscle cramps and spasms occur when a muscle or muscles tighten and you have no control over this tightening (involuntary muscle contraction). ?The most common place for cramps or spasms to  occur is in the calf muscles of the leg. ?Massaging, stretching, and relaxing the affected muscle may relieve the cramp or spasm. ?Drink enough fluid to keep your urine pale yellow. Staying well hydrated may help prevent cramps or spasms. ?This information is not intended to replace advice given to you by your health care provider. Make sure you discuss any questions you have  with your health care provider. ?Document Revised: 10/30/2020 Document Reviewed: 10/30/2020 ?Elsevier Patient Education ? [place]. ? ?

## 2021-08-06 NOTE — Telephone Encounter (Addendum)
Pt called stating the muscle spasms are in her neck, back and chest. Sent to access nurse ?

## 2021-08-06 NOTE — Progress Notes (Signed)
Chief Complaint  ?Patient presents with  ? Acute Visit  ?  Pt c/o muscle spasms in neck, chest and back area onset few days ago. Was seen by Tullo on 07/27/21 was given Prednisone finished course. Treating spasms with Zanaflex w/some relief. Spasms hurt worse when coughing and with certain movements. Spasms worse yesterday.  ? ?Acute visit  ?C/o severe muscle spasms right chest and back and neck x few days ago but worsening since 03/2021 and was given prednisone 07/27/21 by PCP but finished this and taking zanaflex with some relief  ?C/o pleuritic chest pain and pain with coughing in right chest worse this am. Tried heat w/o much relief pain is sharp and radiates right shoulder to right shoulder blade  ?Xray neck  ?FINDINGS: ?No acute fracture or subluxation of the cervical spine. There is ?straightening of normal cervical lordosis which may be positional or ?due to muscle spasm. Multilevel degenerative changes with disc space ?narrowing endplate irregularity and spurring. Multilevel facet ?arthropathy. The visualized posterior elements and odontoid appear ?intact. The timing alignment of the lateral masses of C1 and C2. The ?soft tissues are unremarkable. ?  ?IMPRESSION: ?No acute/traumatic cervical spine pathology. ?  ?  ?Electronically Signed ?  By: Anner Crete M.D. ?  On: 04/07/2021 23:01 ?  ? ?Review of Systems  ?Constitutional:  Negative for weight loss.  ?HENT:  Negative for hearing loss.   ?Eyes:  Negative for blurred vision.  ?Respiratory:  Negative for shortness of breath.   ?Cardiovascular:  Negative for chest pain.  ?Gastrointestinal:  Negative for abdominal pain and blood in stool.  ?Genitourinary:  Negative for dysuria.  ?Musculoskeletal:  Positive for joint pain and neck pain. Negative for falls.  ?     +muscle spasms  ?Skin:  Negative for rash.  ?Neurological:  Negative for headaches.  ?Psychiatric/Behavioral:  Negative for depression.   ?Past Medical History:  ?Diagnosis Date  ? Arthritis   ?  Asthma   ? Depression   ? Dyspnea   ? with heat  ? Environmental and seasonal allergies   ? GERD (gastroesophageal reflux disease)   ? Hypertension   ? Presence of dental prosthetic device   ? Dental implants  ? Wheezing   ? ?Past Surgical History:  ?Procedure Laterality Date  ? ABDOMINAL HYSTERECTOMY  1989  ? BROW LIFT Bilateral 02/07/2020  ? Procedure: BLEPHAROPLASTY UPPER EYELID; W/EXCESS SKIN BROW PTOSIS REPAIR BILATERAL;  Surgeon: Karle Starch, MD;  Location: [place];  Service: Ophthalmology;  Laterality: Bilateral;  ? CATARACT EXTRACTION W/PHACO Right 09/13/2016  ? Procedure: CATARACT EXTRACTION PHACO AND INTRAOCULAR LENS PLACEMENT (IOC);  Surgeon: Birder Robson, MD;  Location: [facility];  Service: Ophthalmology;  Laterality: Right;  [place] 00:38 ?AP% 14.3 ?CDE 5.51 ?Fluid pack lot # [id number]  ? CATARACT EXTRACTION W/PHACO Left 10/11/2016  ? Procedure: CATARACT EXTRACTION PHACO AND INTRAOCULAR LENS PLACEMENT (IOC);  Surgeon: Birder Robson, MD;  Location: [facility];  Service: Ophthalmology;  Laterality: Left;  [place] 00:30 ?AP% 11.3 ?CDE 3.50 ?fluid pack lot # [id number]  ? COLONOSCOPY WITH PROPOFOL N/A 09/30/2014  ? Procedure: COLONOSCOPY WITH PROPOFOL;  Surgeon: Robert Bellow, MD;  Location: [facility] ENDOSCOPY;  Service: Endoscopy;  Laterality: N/A;  ? COLONOSCOPY WITH PROPOFOL N/A 03/24/2021  ? Procedure: COLONOSCOPY WITH PROPOFOL;  Surgeon: Lin Landsman, MD;  Location: [facility] END[facility]COPY;  Service: Gastroenterology;  Laterality: N/A;  ? ESOPHAGOGASTRODUODENOSCOPY N/A 09/30/2014  ? Procedure: ESOPHAGOGASTRODUODENOSCOPY (EGD);  Surgeon: Robert Bellow, MD;  Location: [facility]  ENDOSCOPY;  Service: Endoscopy;  Laterality: N/A;  ? ESOPHAGOGASTRODUODENOSCOPY N/A 03/24/2021  ? Procedure: ESOPHAGOGASTRODUODENOSCOPY (EGD);  Surgeon: Lin Landsman, MD;  Location: [facility] ENDOSCOPY;  Service: Gastroenterology;  Laterality: N/A;  ? FOOT SURGERY Left 1985  ? KNEE SURGERY  2016  ? ?Family History  ?Problem  Relation Age of Onset  ? Diabetes Mother   ? Hyperlipidemia Mother   ? Heart disease Mother   ? Prostate cancer Neg Hx   ? Kidney cancer Neg Hx   ? Bladder Cancer Neg Hx   ? ?Social History  ? ?Socioeconomic History  ? Marital status: Single  ?  Spouse name: Not on file  ? Number of children: Not on file  ? Years of education: Not on file  ? Highest education level: Not on file  ?Occupational History  ? Not on file  ?Tobacco Use  ? Smoking status: Never  ? Smokeless tobacco: Never  ?Vaping Use  ? Vaping Use: Never used  ?Substance and Sexual Activity  ? Alcohol use: No  ? Drug use: No  ? Sexual activity: Never  ?Other Topics Concern  ? Not on file  ?Social History Narrative  ? Worked in [employer]- in [employer]. No smoking; no alcohol. Lives in [place], [place] lives self/kitty.   ? ?Social Determinants of Health  ? ?Financial Resource Strain: Medium Risk  ? Difficulty of Paying Living Expenses: Somewhat hard  ?Food Insecurity: No Food Insecurity  ? Worried About Charity fundraiser in the Last Year: Never true  ? Ran Out of Food in the Last Year: Never true  ?Transportation Needs: No Transportation Needs  ? Lack of Transportation (Medical): No  ? Lack of Transportation (Non-Medical): No  ?Physical Activity: Not on file  ?Stress: No Stress Concern Present  ? Feeling of Stress : Not at all  ?Social Connections: Unknown  ? Frequency of Communication with Friends and Family: More than three times a week  ? Frequency of Social Gatherings with Friends and Family: More than three times a week  ? Attends Religious Services: More than 4 times per year  ? Active Member of Clubs or Organizations: Yes  ? Attends Archivist Meetings: More than 4 times per year  ? Marital Status: Not on file  ?Intimate Partner Violence: Not At Risk  ? Fear of Current or Ex-Partner: No  ? Emotionally Abused: No  ? Physically Abused: No  ? Sexually Abused: No  ? ?Current Meds  ?Medication Sig  ? albuterol (VENTOLIN HFA) 108 (90  Base) MCG/ACT inhaler Inhale 2 puffs into the lungs every 6 (six) hours as needed for wheezing or shortness of breath.  ? amLODipine (NORVASC) 5 MG tablet Take 1 tablet (5 mg total) by mouth daily.  ? budesonide (PULMICORT) 0.25 MG/2ML nebulizer solution Take 2 mLs (0.25 mg total) by nebulization 2 (two) times daily.  ? Calcium Citrate-Vitamin D (CALCIUM CITRATE + D PO) Take 1 tablet by mouth 2 (two) times daily.  ? citalopram (CELEXA) 20 MG tablet Take 1 tablet (20 mg total) by mouth daily.  ? famotidine (PEPCID) 20 MG tablet TAKE 1 TABLET BY MOUTH DAILY. BEFORE DINNER  ? fexofenadine (ALLEGRA) 180 MG tablet Take 180 mg by mouth daily.  ? Fluticasone-Umeclidin-Vilant (TRELEGY ELLIPTA) 100-62.5-25 MCG/ACT AEPB Inhale 1 puff into the lungs daily.  ? gabapentin (NEURONTIN) 100 MG capsule Take 1 capsule (100 mg total) by mouth 3 (three) times daily. (Patient taking differently: Take 100 mg by mouth daily.)  ?  ipratropium-albuterol (DUONEB) 0.5-2.5 (3) MG/3ML SOLN Take 3 mLs by nebulization every 6 (six) hours as needed.  ? levothyroxine (SYNTHROID) 50 MCG tablet TAKE 1 TABLET EVERY DAY ON EMPTY STOMACHWITH A GLASS OF WATER AT LEAST 30-60 MINBEFORE BREAKFAST  ? mirabegron ER (MYRBETRIQ) 50 MG TB24 tablet Take 1 tablet (50 mg total) by mouth daily.  ? pantoprazole (PROTONIX) 40 MG tablet Take 1 tablet (40 mg total) by mouth daily.  ? predniSONE (DELTASONE) 10 MG tablet 6 tablets daily for 3 days, then reduce by 1 tablet daily until gone  ? rosuvastatin (CRESTOR) 10 MG tablet Take 1 tablet (10 mg total) by mouth daily.  ? traMADol (ULTRAM) 50 MG tablet Take 1 tablet (50 mg total) by mouth 2 (two) times daily as needed for up to 5 days.  ? traZODone (DESYREL) 50 MG tablet Take 0.5-1 tablets (25-50 mg total) by mouth at bedtime as needed for sleep.  ? vitamin B-12 (CYANOCOBALAMIN) 1000 MCG tablet Take 1,000 mcg by mouth daily.  ? [DISCONTINUED] tiZANidine (ZANAFLEX) 4 MG tablet Take 1 tablet (4 mg total) by mouth every 6  (six) hours as needed for muscle spasms.  ? ?No Known Allergies ?Recent Results (from the past 2160 hour(s))  ?TSH     Status: None  ? Collection Time: 06/07/21  2:42 PM  ?Result Value Ref Range  ? TSH 1.15 0.35 - 5.5

## 2021-08-09 ENCOUNTER — Telehealth: Payer: Self-pay | Admitting: Internal Medicine

## 2021-08-09 NOTE — Telephone Encounter (Signed)
Patient saw Dr. Aundra Dubin patient on Friday and patient concerned wanted to ask PCP about stopping the cholesterol medication. Patient pain started in December in her neck cholesterol medication Crestor started in 06/18/2021. Patient says she is still having sharpe pains when she coughs under her shoulder blade through her chest. ?

## 2021-08-09 NOTE — Telephone Encounter (Signed)
Pt called in stating that she saw another doctor on Friday for her back... Pt stated that the doctor took her off her cholesterol pill... Pt is requesting to speak with [facility] about it....  ?

## 2021-08-11 NOTE — Telephone Encounter (Signed)
Patient aware has FU scheduled for 09/17/21 will restart cholesterol medication. ?

## 2021-08-19 ENCOUNTER — Telehealth: Payer: Self-pay | Admitting: Internal Medicine

## 2021-08-19 NOTE — Telephone Encounter (Signed)
Called Pt LVMTCB.... Provider will be out of office on 09/17/2021... Please reschedule pt  ?

## 2021-08-25 ENCOUNTER — Ambulatory Visit
Admission: RE | Admit: 2021-08-25 | Discharge: 2021-08-25 | Disposition: A | Discharge status: Home / Self Care | Payer: Medicare Other | Source: Ambulatory Visit | Attending: Internal Medicine | Admitting: Internal Medicine

## 2021-08-25 DIAGNOSIS — K7689 Other specified diseases of liver: Secondary | ICD-10-CM | POA: Diagnosis not present

## 2021-08-25 DIAGNOSIS — K769 Liver disease, unspecified: Secondary | ICD-10-CM | POA: Diagnosis not present

## 2021-08-25 DIAGNOSIS — D1803 Hemangioma of intra-abdominal structures: Secondary | ICD-10-CM | POA: Diagnosis not present

## 2021-08-25 DIAGNOSIS — N281 Cyst of kidney, acquired: Secondary | ICD-10-CM | POA: Diagnosis not present

## 2021-08-25 DIAGNOSIS — K449 Diaphragmatic hernia without obstruction or gangrene: Secondary | ICD-10-CM | POA: Diagnosis not present

## 2021-08-25 MED ORDER — GADOXETATE DISODIUM 0.25 MMOL/ML IV SOLN
8.0000 mL | Freq: Once | INTRAVENOUS | Status: AC | PRN
  Administered 2021-08-25: 8 mL via INTRAVENOUS

## 2021-08-27 ENCOUNTER — Encounter: Payer: Self-pay | Admitting: Internal Medicine

## 2021-08-27 ENCOUNTER — Inpatient Hospital Stay: Payer: Medicare Other | Attending: Internal Medicine

## 2021-08-27 ENCOUNTER — Inpatient Hospital Stay: Payer: Medicare Other | Admitting: Internal Medicine

## 2021-08-27 DIAGNOSIS — Z8249 Family history of ischemic heart disease and other diseases of the circulatory system: Secondary | ICD-10-CM | POA: Diagnosis not present

## 2021-08-27 DIAGNOSIS — Z833 Family history of diabetes mellitus: Secondary | ICD-10-CM | POA: Insufficient documentation

## 2021-08-27 DIAGNOSIS — Z7952 Long term (current) use of systemic steroids: Secondary | ICD-10-CM | POA: Insufficient documentation

## 2021-08-27 DIAGNOSIS — E785 Hyperlipidemia, unspecified: Secondary | ICD-10-CM | POA: Insufficient documentation

## 2021-08-27 DIAGNOSIS — Z7951 Long term (current) use of inhaled steroids: Secondary | ICD-10-CM | POA: Insufficient documentation

## 2021-08-27 DIAGNOSIS — Z596 Low income: Secondary | ICD-10-CM | POA: Diagnosis not present

## 2021-08-27 DIAGNOSIS — R5383 Other fatigue: Secondary | ICD-10-CM | POA: Diagnosis not present

## 2021-08-27 DIAGNOSIS — Z79899 Other long term (current) drug therapy: Secondary | ICD-10-CM | POA: Insufficient documentation

## 2021-08-27 DIAGNOSIS — M542 Cervicalgia: Secondary | ICD-10-CM | POA: Diagnosis not present

## 2021-08-27 DIAGNOSIS — I10 Essential (primary) hypertension: Secondary | ICD-10-CM | POA: Insufficient documentation

## 2021-08-27 DIAGNOSIS — J45909 Unspecified asthma, uncomplicated: Secondary | ICD-10-CM | POA: Insufficient documentation

## 2021-08-27 DIAGNOSIS — D1803 Hemangioma of intra-abdominal structures: Secondary | ICD-10-CM | POA: Diagnosis not present

## 2021-08-27 DIAGNOSIS — K769 Liver disease, unspecified: Secondary | ICD-10-CM

## 2021-08-27 DIAGNOSIS — D649 Anemia, unspecified: Secondary | ICD-10-CM | POA: Insufficient documentation

## 2021-08-27 DIAGNOSIS — Z8349 Family history of other endocrine, nutritional and metabolic diseases: Secondary | ICD-10-CM | POA: Insufficient documentation

## 2021-08-27 DIAGNOSIS — R0789 Other chest pain: Secondary | ICD-10-CM | POA: Insufficient documentation

## 2021-08-27 DIAGNOSIS — K7689 Other specified diseases of liver: Secondary | ICD-10-CM | POA: Diagnosis not present

## 2021-08-27 DIAGNOSIS — N281 Cyst of kidney, acquired: Secondary | ICD-10-CM | POA: Insufficient documentation

## 2021-08-27 DIAGNOSIS — D539 Nutritional anemia, unspecified: Secondary | ICD-10-CM

## 2021-08-27 DIAGNOSIS — M2550 Pain in unspecified joint: Secondary | ICD-10-CM | POA: Insufficient documentation

## 2021-08-27 DIAGNOSIS — K449 Diaphragmatic hernia without obstruction or gangrene: Secondary | ICD-10-CM | POA: Insufficient documentation

## 2021-08-27 LAB — COMPREHENSIVE METABOLIC PANEL
ALT: 13 U/L (ref 0–44)
AST: 15 U/L (ref 15–41)
Albumin: 4.1 g/dL (ref 3.5–5.0)
Alkaline Phosphatase: 71 U/L (ref 38–126)
Anion gap: 6 (ref 5–15)
BUN: 17 mg/dL (ref 8–23)
CO2: 26 mmol/L (ref 22–32)
Calcium: 9 mg/dL (ref 8.9–10.3)
Chloride: 104 mmol/L (ref 98–111)
Creatinine, Ser: 0.8 mg/dL (ref 0.44–1.00)
GFR, Estimated: >60 mL/min (ref >60)
Glucose, Bld: 113 mg/dL — ABNORMAL HIGH (ref 70–99)
Potassium: 3.8 mmol/L (ref 3.5–5.1)
Sodium: 136 mmol/L (ref 135–145)
Total Bilirubin: 0.5 mg/dL (ref 0.3–1.2)
Total Protein: 7.1 g/dL (ref 6.5–8.1)

## 2021-08-27 LAB — CBC WITH DIFFERENTIAL/PLATELET
Abs Immature Granulocytes: 0.06 10*3/uL (ref 0.00–0.07)
Basophils Absolute: 0.1 10*3/uL (ref 0.0–0.1)
Basophils Relative: 2 %
Eosinophils Absolute: 0.2 10*3/uL (ref 0.0–0.5)
Eosinophils Relative: 5 %
HCT: 34.3 % — ABNORMAL LOW (ref 36.0–46.0)
Hemoglobin: 11.7 g/dL — ABNORMAL LOW (ref 12.0–15.0)
Immature Granulocytes: 1 %
Lymphocytes Relative: 33 %
Lymphs Abs: 1.4 10*3/uL (ref 0.7–4.0)
MCH: 35.7 pg — ABNORMAL HIGH (ref 26.0–34.0)
MCHC: 34.1 g/dL (ref 30.0–36.0)
MCV: 104.6 fL — ABNORMAL HIGH (ref 80.0–100.0)
Monocytes Absolute: 0.3 10*3/uL (ref 0.1–1.0)
Monocytes Relative: 8 %
Neutro Abs: 2.2 10*3/uL (ref 1.7–7.7)
Neutrophils Relative %: 51 %
Platelets: 152 10*3/uL (ref 150–400)
RBC: 3.28 MIL/uL — ABNORMAL LOW (ref 3.87–5.11)
RDW: 13.7 % (ref 11.5–15.5)
WBC: 4.3 10*3/uL (ref 4.0–10.5)
nRBC: 0 % (ref 0.0–0.2)

## 2021-08-27 LAB — LACTATE DEHYDROGENASE: LDH: 137 U/L (ref 98–192)

## 2021-08-27 NOTE — Progress Notes (Signed)
Patient has new right side neck pain that was evaluated by Dr. Derrel Nip who advises that it is arthritis seen on xray.  But patient thinks it is more than arthritis because the pain is unbearable, 10/10 pain scale constant. ?

## 2021-08-27 NOTE — Assessment & Plan Note (Addendum)
#  Mild macrocytic anemia/thrombocytopenia-hemoglobin around 11-12; platelets 127-140s.  Normal white count.  Today hemoglobin is 11.7 platelets 160. ? ?#Overall anemia/thrombocytopenia intermittent/stable; no significant worsening over the last  6-8 months also. Given the slight improvement/lack of worsening of the blood work I think is reasonable to hold off a bone marrow biopsy at this time; I will reevaluate the blood counts in 6 months.  If worsening would recommend a bone marrow biopsy. Pt in agreement.  ? ?# Incidental cysts in liver/US [OCT 2022-]: 13 mm hypoechoic mass within the liver-surveillance MRI May 2023- Stable small hepatic hemangioma and small hepatic cysts; Parapelvic renal cysts; iny hiatal hernia-given the overall stability of the cysts these would be considered benign.  Would not recommend any further [facility] imaging.. ?? ?# Neck pain/likely arthritic [Dr.Tullo]  on Tramadol/Zanaflex prn [not taken this AM-drowsiness driving].  Recommend speaking to PCP regarding referral to orthopedics for further management.  ?? ?# DISPOSITION:  ?# Follow up in 6 months-  MD ; cbc/cmp/LDH; - Dr.B ? ?? ?

## 2021-08-27 NOTE — Progress Notes (Signed)
[facility] ?CONSULT NOTE ? ?Patient Care Team: ?Tullo, Teresa L, MD as PCP - General (Internal Medicine) ?Tullo, Teresa L, MD (Internal Medicine) ?Byrnett, Jeffrey W, MD (General Surgery) ?Brahmanday, Govinda R, MD as Consulting Physician (Hematology and Oncology) ? ?CHIEF COMPLAINTS/PURPOSE OF CONSULTATION: ANEMIA ? ? ?HEMATOLOGY HISTORY: ? ?# MACROCYTIC ANEMIA [since 2022- April hb 11; MCV-103; platelet- 120s] EGD/ colonoscopy >>> 10 years ago; OCT 2022-slight improvement blood counts/stable; hepatitis HIV negative.  Myeloma panel negative.  HOLD off  bone marrow biopsy; ? ?#Incidental right hepatic lobe 13 mm hypoechoic lesion [ultrasound-October 2022]-MRI OCT 2022-   Low clinical concern for metastatic disease; however need to rule out primary HCC versus others.  1. There are 3 small liver lesions, 2 of which represent simple cysts. 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 MAY 2023-abdominal MRI -benign cyst no further surveillance recommended.  ? ?#October 2022-ultrasound incidental bilateral mild to moderate hydronephrosis.-Rs/p evaluation with Dr.Brandon urology. ? ? ?HISTORY OF PRESENTING ILLNESS: Ambulating independently.  Alone. ? ?Sarah [name] 71 y.o.  female is here for follow-up today with the blood work ultrasound that was ordered for patient's  anemia/thrombocytopenia/MRI abdomen. ? ?Patient complains of worsening "episodes"of right-sided neck pain over the last 3 to 4 months.  Patient is currently treated with tramadol/muscle relaxants.  X-rays-no acute process. ? ?Denies any blood in stools or black-colored stools but denies any nausea vomiting.  Chronic mild fatigue.  Chronic mild easy bruising.  No tremors. ? ?Review of Systems  ?Constitutional:  Positive for malaise/fatigue. Negative for chills, diaphoresis, fever and weight loss.  ?HENT:  Negative for nosebleeds and sore throat.   ?Eyes:  Negative for  double vision.  ?Respiratory:  Negative for cough, hemoptysis, sputum production, shortness of breath and wheezing.   ?Cardiovascular:  Negative for chest pain, palpitations, orthopnea and leg swelling.  ?Gastrointestinal:  Negative for abdominal pain, blood in stool, constipation, diarrhea, heartburn, melena, nausea and vomiting.  ?Genitourinary:  Negative for dysuria, frequency and urgency.  ?Musculoskeletal:  Positive for joint pain. Negative for back pain.  ?Skin: Negative.  Negative for itching and rash.  ?Neurological:  Negative for dizziness, tingling, focal weakness, weakness and headaches.  ?Endo/Heme/Allergies:  Bruises/bleeds easily.  ?Psychiatric/Behavioral:  Negative for depression. The patient is not nervous/anxious and does not have insomnia.   ? ?MEDICAL HISTORY:  ?Past Medical History:  ?Diagnosis Date  ? Arthritis   ? Asthma   ? Depression   ? Dyspnea   ? with heat  ? Environmental and seasonal allergies   ? GERD (gastroesophageal reflux disease)   ? Hypertension   ? Presence of dental prosthetic device   ? Dental implants  ? Wheezing   ? ? ?SURGICAL HISTORY: ?Past Surgical History:  ?Procedure Laterality Date  ? ABDOMINAL HYSTERECTOMY  1989  ? BROW LIFT Bilateral 02/07/2020  ? Procedure: BLEPHAROPLASTY UPPER EYELID; W/EXCESS SKIN BROW PTOSIS REPAIR BILATERAL;  Surgeon: Fowler, Amy M, MD;  Location: [facility];  Service: Ophthalmology;  Laterality: Bilateral;  ? CATARACT EXTRACTION W/PHACO Right 09/13/2016  ? Procedure: CATARACT EXTRACTION PHACO AND INTRAOCULAR LENS PLACEMENT (IOC);  Surgeon: Porfilio, William, MD;  Location: [facility];  Service: Ophthalmology;  Laterality: Right;  US 00:38 ?AP% 14.3 ?CDE 5.51 ?Fluid pack lot # [id number]  ? CATARACT EXTRACTION W/PHACO Left 10/11/2016  ? Procedure: CATARACT EXTRACTION PHACO AND INTRAOCULAR LENS PLACEMENT (IOC);  Surgeon: Porfilio, William, MD;  Location: [facility];  Service: Ophthalmology;    Laterality: Left;  [place] 00:30 ?AP% 11.3 ?CDE 3.50 ?fluid  pack lot # [id number]  ? COLONOSCOPY WITH PROPOFOL N/A 09/30/2014  ? Procedure: COLONOSCOPY WITH PROPOFOL;  Surgeon: Robert Bellow, MD;  Location: [facility]COPY;  Service: Endoscopy;  Laterality: N/A;  ? COLONOSCOPY WITH PROPOFOL N/A 03/24/2021  ? Procedure: COLONOSCOPY WITH PROPOFOL;  Surgeon: Lin Landsman, MD;  Location: [facility]COPY;  Service: Gastroenterology;  Laterality: N/A;  ? ESOPHAGOGASTRODUODENOSCOPY N/A 09/30/2014  ? Procedure: ESOPHAGOGASTRODUODENOSCOPY (EGD);  Surgeon: Robert Bellow, MD;  Location: [facility] END[facility]COPY;  Service: Endoscopy;  Laterality: N/A;  ? ESOPHAGOGASTRODUODENOSCOPY N/A 03/24/2021  ? Procedure: ESOPHAGOGASTRODUODENOSCOPY (EGD);  Surgeon: Lin Landsman, MD;  Location: [facility] ENDOSCOPY;  Service: Gastroenterology;  Laterality: N/A;  ? FOOT SURGERY Left 1985  ? KNEE SURGERY  2016  ? ? ?SOCIAL HISTORY: ?Social History  ? ?Socioeconomic History  ? Marital status: Single  ?  Spouse name: Not on file  ? Number of children: Not on file  ? Years of education: Not on file  ? Highest education level: Not on file  ?Occupational History  ? Not on file  ?Tobacco Use  ? Smoking status: Never  ? Smokeless tobacco: Never  ?Vaping Use  ? Vaping Use: Never used  ?Substance and Sexual Activity  ? Alcohol use: No  ? Drug use: No  ? Sexual activity: Never  ?Other Topics Concern  ? Not on file  ?Social History Narrative  ? Worked in [employer]- in [employer]. No smoking; no alcohol. Lives in [place], [place] lives self/kitty.   ? ?Social Determinants of Health  ? ?Financial Resource Strain: Medium Risk  ? Difficulty of Paying Living Expenses: Somewhat hard  ?Food Insecurity: No Food Insecurity  ? Worried About Charity fundraiser in the Last Year: Never true  ? Ran Out of Food in the Last Year: Never true  ?Transportation Needs: No Transportation Needs  ? Lack of Transportation (Medical): No  ? Lack of Transportation (Non-Medical): No  ?Physical Activity: Not on file  ?Stress: No Stress  Concern Present  ? Feeling of Stress : Not at all  ?Social Connections: Unknown  ? Frequency of Communication with Friends and Family: More than three times a week  ? Frequency of Social Gatherings with Friends and Family: More than three times a week  ? Attends Religious Services: More than 4 times per year  ? Active Member of Clubs or Organizations: Yes  ? Attends Archivist Meetings: More than 4 times per year  ? Marital Status: Not on file  ?Intimate Partner Violence: Not At Risk  ? Fear of Current or Ex-Partner: No  ? Emotionally Abused: No  ? Physically Abused: No  ? Sexually Abused: No  ? ? ?FAMILY HISTORY: ?Family History  ?Problem Relation Age of Onset  ? Diabetes Mother   ? Hyperlipidemia Mother   ? Heart disease Mother   ? Prostate cancer Neg Hx   ? Kidney cancer Neg Hx   ? Bladder Cancer Neg Hx   ? ? ?ALLERGIES:  has No Known Allergies. ? ?MEDICATIONS:  ?Current Outpatient Medications  ?Medication Sig Dispense Refill  ? albuterol (VENTOLIN HFA) 108 (90 Base) MCG/ACT inhaler Inhale 2 puffs into the lungs every 6 (six) hours as needed for wheezing or shortness of breath. 3 each 3  ? amLODipine (NORVASC) 5 MG tablet Take 1 tablet (5 mg total) by mouth daily. 90 tablet 3  ? budesonide (PULMICORT) 0.25 MG/2ML nebulizer solution Take 2 mLs (0.25  mg total) by nebulization 2 (two) times daily. 60 mL 12  ? Calcium Citrate-Vitamin D (CALCIUM CITRATE + D PO) Take 1 tablet by mouth 2 (two) times daily.    ? citalopram (CELEXA) 20 MG tablet Take 1 tablet (20 mg total) by mouth daily. 90 tablet 3  ? fexofenadine (ALLEGRA) 180 MG tablet Take 180 mg by mouth daily.    ? Fluticasone-Umeclidin-Vilant (TRELEGY ELLIPTA) 100-62.5-25 MCG/ACT AEPB Inhale 1 puff into the lungs daily. 1 each 11  ? gabapentin (NEURONTIN) 100 MG capsule Take 1 capsule (100 mg total) by mouth 3 (three) times daily. (Patient taking differently: Take 100 mg by mouth daily.) 270 capsule 3  ? ipratropium-albuterol (DUONEB) 0.5-2.5 (3) MG/3ML  SOLN Take 3 mLs by nebulization every 6 (six) hours as needed. 360 mL 2  ? levothyroxine (SYNTHROID) 50 MCG tablet TAKE 1 TABLET EVERY DAY ON EMPTY STOMACHWITH A GLASS OF WATER AT LEAST 30-60 MINBEFORE

## 2021-08-30 ENCOUNTER — Telehealth: Payer: Self-pay | Admitting: Internal Medicine

## 2021-08-30 ENCOUNTER — Other Ambulatory Visit: Payer: Self-pay | Admitting: Internal Medicine

## 2021-08-30 NOTE — Telephone Encounter (Signed)
Pt called in requesting to send message to [facility]... Pt stated that she would like to speak with [facility] about her neck pain... Pt state that the neck pain is behind her ears... Pt requesting callback  ?

## 2021-08-30 NOTE — Telephone Encounter (Signed)
noted 

## 2021-08-30 NOTE — Telephone Encounter (Signed)
Patient having spasm in her neck over the weekend and today taking Tizanidine 4 mg s which helps but asking to see PCP to discuss if referral to a neurologist would help. Scheduled patient for Tuesday at at 10:30 am. ?

## 2021-08-31 ENCOUNTER — Encounter: Payer: Self-pay | Admitting: Internal Medicine

## 2021-08-31 ENCOUNTER — Ambulatory Visit (INDEPENDENT_AMBULATORY_CARE_PROVIDER_SITE_OTHER): Payer: Medicare Other | Admitting: Internal Medicine

## 2021-08-31 VITALS — BP 128/74 | HR 81 | Temp 98.0°F | Ht 63.0 in | Wt 178.2 lb

## 2021-08-31 DIAGNOSIS — M542 Cervicalgia: Secondary | ICD-10-CM

## 2021-08-31 MED ORDER — PREDNISONE 10 MG PO TABS: ORAL_TABLET | ORAL | 0 refills | Status: DC

## 2021-08-31 MED ORDER — TRAMADOL HCL 50 MG PO TABS
50.0000 mg | ORAL_TABLET | Freq: Four times a day (QID) | ORAL | 0 refills | Status: DC | PRN
Start: 2021-08-31 — End: 2021-11-12

## 2021-08-31 MED ORDER — KETOROLAC TROMETHAMINE 60 MG/2ML IM SOLN
60.0000 mg | Freq: Once | INTRAMUSCULAR | Status: AC
  Administered 2021-08-31: 60 mg via INTRAMUSCULAR

## 2021-08-31 NOTE — Progress Notes (Signed)
? ?Subjective:  ?Patient ID: Sarah [name], female    DOB: Dec 23, 1950  Age: 71 y.o. MRN: [id number] ? ?CC: The primary encounter diagnosis was Neck pain of over 3 months duration. A diagnosis of Acute neck pain was also pertinent to this visit. ? ? ?This visit occurred during the SARS-CoV-2 public health emergency.  Safety protocols were in place, including screening questions prior to the visit, additional usage of staff PPE, and extensive cleaning of exam room while observing appropriate contact time as indicated for disinfecting solutions.   ? ?HPI ?Sarah [name] presents for  ?Chief Complaint  ?Patient presents with  ? Follow-up  ?  Follow up on neck pain  ? ? ?71 yr old female presents with persistent posterior neck pain since December.  Pain became severe on May 1 while driving and was brought on by rotating head to drive. The pain radiates to the right occipital area .  Heating pad helps some.  Prednisone helped somewhat but she is in constant pain which is affecting her sleep.  ? ?Plain films in April noted Multilevel degenerative changes with disc space narrowing endplate irregularity and spurring., and Multilevel facet arthropathy. The visualized posterior elements  ? ?Outpatient Medications Prior to Visit  ?Medication Sig Dispense Refill  ? albuterol (VENTOLIN HFA) 108 (90 Base) MCG/ACT inhaler Inhale 2 puffs into the lungs every 6 (six) hours as needed for wheezing or shortness of breath. 3 each 3  ? amLODipine (NORVASC) 5 MG tablet Take 1 tablet (5 mg total) by mouth daily. 90 tablet 3  ? budesonide (PULMICORT) 0.25 MG/2ML nebulizer solution Take 2 mLs (0.25 mg total) by nebulization 2 (two) times daily. 60 mL 12  ? Calcium Citrate-Vitamin D (CALCIUM CITRATE + D PO) Take 1 tablet by mouth 2 (two) times daily.    ? citalopram (CELEXA) 20 MG tablet Take 1 tablet (20 mg total) by mouth daily. 90 tablet 3  ? famotidine (PEPCID) 20 MG tablet TAKE 1 TABLET BY MOUTH DAILY. BEFORE DINNER 30 tablet 11  ?  fexofenadine (ALLEGRA) 180 MG tablet Take 180 mg by mouth daily.    ? Fluticasone-Umeclidin-Vilant (TRELEGY ELLIPTA) 100-62.5-25 MCG/ACT AEPB Inhale 1 puff into the lungs daily. 1 each 11  ? gabapentin (NEURONTIN) 100 MG capsule Take 1 capsule (100 mg total) by mouth 3 (three) times daily. (Patient taking differently: Take 100 mg by mouth daily.) 270 capsule 3  ? ipratropium-albuterol (DUONEB) 0.5-2.5 (3) MG/3ML SOLN Take 3 mLs by nebulization every 6 (six) hours as needed. 360 mL 2  ? levothyroxine (SYNTHROID) 50 MCG tablet TAKE 1 TABLET EVERY DAY ON EMPTY STOMACHWITH A GLASS OF WATER AT LEAST 30-60 MINBEFORE BREAKFAST 90 tablet 3  ? mirabegron ER (MYRBETRIQ) 50 MG TB24 tablet Take 1 tablet (50 mg total) by mouth daily. 30 tablet 5  ? pantoprazole (PROTONIX) 40 MG tablet Take 1 tablet (40 mg total) by mouth daily. 90 tablet 3  ? rosuvastatin (CRESTOR) 10 MG tablet Take 1 tablet (10 mg total) by mouth daily. 90 tablet 3  ? traZODone (DESYREL) 50 MG tablet Take 0.5-1 tablets (25-50 mg total) by mouth at bedtime as needed for sleep. 90 tablet 1  ? vitamin B-12 (CYANOCOBALAMIN) 1000 MCG tablet Take 1,000 mcg by mouth daily.    ? tiZANidine (ZANAFLEX) 4 MG tablet Take 1 tablet (4 mg total) by mouth 2 (two) times daily as needed for muscle spasms. 60 tablet 1  ? predniSONE (DELTASONE) 10 MG tablet 6 tablets daily for  3 days, then reduce by 1 tablet daily until gone (Patient not taking: Reported on 08/31/2021) 33 tablet 0  ? ?No facility-administered medications prior to visit.  ? ? ?Review of Systems; ? ?Patient denies headache, fevers, malaise, unintentional weight loss, skin rash, eye pain, sinus congestion and sinus pain, sore throat, dysphagia,  hemoptysis , cough, dyspnea, wheezing, chest pain, palpitations, orthopnea, edema, abdominal pain, nausea, melena, diarrhea, constipation, flank pain, dysuria, hematuria, urinary  Frequency, nocturia, numbness, tingling, seizures,  Focal weakness, Loss of consciousness,   Tremor, insomnia, depression, anxiety, and suicidal ideation.   ? ? ? ?Objective:  ?BP 128/74 (BP Location: Left Arm, Patient Position: Sitting, Cuff Size: Normal)   Pulse 81   Temp 98 ?F (36.7 ?C) (Oral)   Ht '5\' 3"'$  (1.6 m)   Wt 178 lb 3.2 oz (80.8 kg)   SpO2 98%   BMI 31.57 kg/m?  ? ?BP Readings from Last 3 Encounters:  ?08/31/21 128/74  ?08/27/21 130/65  ?08/06/21 134/70  ? ? ?Wt Readings from Last 3 Encounters:  ?08/31/21 178 lb 3.2 oz (80.8 kg)  ?08/27/21 179 lb 3.2 oz (81.3 kg)  ?08/06/21 185 lb 6.4 oz (84.1 kg)  ? ? ?General appearance: alert, appears to be in pain, cooperative and appears stated age ?Ears: normal TM's and external ear canals both ears ?Throat: lips, mucosa, and tongue normal; teeth and gums normal ?Neck: no adenopathy, no carotid bruit, supple, symmetrical, trachea midline and thyroid not enlarged, symmetric, no tenderness/mass/nodules ?Back: symmetric, no curvature. ROM normal. No CVA tenderness. ?Lungs: clear to auscultation bilaterally ?Heart: regular rate and rhythm, S1, S2 normal, no murmur, click, rub or gallop ?Abdomen: soft, non-tender; bowel sounds normal; no masses,  no organomegaly ?Pulses: 2+ and symmetric ?Skin: Skin color, texture, turgor normal. No rashes or lesions ?Lymph nodes: Cervical, supraclavicular, and axillary nodes normal. ? ?Lab Results  ?Component Value Date  ? HGBA1C 6.0 06/07/2021  ? HGBA1C 6.4 12/10/2020  ? HGBA1C 6.0 06/12/2020  ? ? ?Lab Results  ?Component Value Date  ? CREATININE 0.80 08/27/2021  ? CREATININE 0.89 08/06/2021  ? CREATININE 0.77 06/16/2021  ? ? ?Lab Results  ?Component Value Date  ? WBC 4.3 08/27/2021  ? HGB 11.7 (L) 08/27/2021  ? HCT 34.3 (L) 08/27/2021  ? PLT 152 08/27/2021  ? GLUCOSE 113 (H) 08/27/2021  ? CHOL 244 (H) 06/07/2021  ? TRIG 106.0 06/07/2021  ? HDL 71.40 06/07/2021  ? LDLCALC 151 (H) 06/07/2021  ? ALT 13 08/27/2021  ? AST 15 08/27/2021  ? NA 136 08/27/2021  ? K 3.8 08/27/2021  ? CL 104 08/27/2021  ? CREATININE 0.80  08/27/2021  ? BUN 17 08/27/2021  ? CO2 26 08/27/2021  ? TSH 1.15 06/07/2021  ? HGBA1C 6.0 06/07/2021  ? MICROALBUR <0.7 06/07/2021  ? ? ?MR LIVER W WO CONTRAST ? ?Result Date: 08/25/2021 ?CLINICAL DATA:  Liver lesion follow-up EXAM: MRI ABDOMEN WITHOUT AND WITH CONTRAST TECHNIQUE: Multiplanar multisequence MR imaging of the abdomen was performed both before and after the administration of intravenous contrast. CONTRAST:  86m EOVIST GADOXETATE DISODIUM 0.25 MOL/L IV SOLN COMPARISON:  MRI abdomen 02/19/2021 FINDINGS: Lower chest: No acute findings. Hepatobiliary: Liver is normal in size and contour stable size and appearance of a mildly lobulated hyperintense T2 signal lesion in segment 5/6 measuring 12 mm which demonstrates early arterial enhancement which persists until the hepatobiliary phase, consistent with hemangioma. Two small cysts again seen measuring up to 15 mm in segment 2. Gallbladder appears normal. No biliary  ductal dilatation. Pancreas: No mass, inflammatory changes, or other parenchymal abnormality identified. Spleen:  Within normal limits in size and appearance. Adrenals/Urinary Tract: Adrenal glands appear normal. Multiple parapelvic cysts are again seen bilaterally. No suspicious enhancing renal mass identified. Stomach/Bowel: Tiny hiatal hernia. No evidence of bowel obstruction. Vascular/Lymphatic: No pathologically enlarged lymph nodes identified. No abdominal aortic aneurysm demonstrated. Other:  No ascites Musculoskeletal: No suspicious bone lesions identified. IMPRESSION: 1. Stable small hepatic hemangioma and small hepatic cysts. 2. Parapelvic renal cysts. 3. Tiny hiatal hernia. Electronically Signed   By: Ofilia Neas M.D.   On: 08/25/2021 12:12  ? ? ?Assessment & Plan:  ? ?Problem List Items Addressed This Visit   ? ? Acute neck pain  ?  She has had no significant or continued relief of pain for the past month and pain has been significantly worse since May 1.  It is affecting her  ability to rest and function  .  Toradol injection given today,  With plans to repeat prednisone taper and order MRI cervical spine to evaluate C3 radicular symptoms and refer for ESI ? ?  ?  ? ?Other Visit Diagnoses   ? ? Neck pai

## 2021-08-31 NOTE — Patient Instructions (Addendum)
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 ?For your pain  ? ?2 mg tizanidine and 25 to 50  mg tramadol every 6 hours as needed for pan  ? ?8 day prednisone taper ;  start this TOMORROW ? ?MRI cervical spine and PT referral in process ? ?I will also be referring you to Dr Sharlet Salina at [place] for an injection in your neck  ?

## 2021-08-31 NOTE — Assessment & Plan Note (Addendum)
She has had no significant or continued relief of pain for the past month and pain has been significantly worse since May 1.  It is affecting her ability to rest and function  .  Toradol injection given today,  With plans to repeat prednisone taper and order MRI cervical spine to evaluate C3 radicular symptoms and refer for ESI ?

## 2021-09-02 ENCOUNTER — Telehealth: Payer: Self-pay | Admitting: Internal Medicine

## 2021-09-02 NOTE — Telephone Encounter (Signed)
Pt want MRI referral sent to [place] or [place] instead of [place] ?

## 2021-09-03 ENCOUNTER — Ambulatory Visit: Payer: Medicare Other | Admitting: Sports Medicine

## 2021-09-08 ENCOUNTER — Telehealth: Payer: Self-pay | Admitting: Internal Medicine

## 2021-09-08 DIAGNOSIS — E785 Hyperlipidemia, unspecified: Secondary | ICD-10-CM

## 2021-09-08 DIAGNOSIS — E039 Hypothyroidism, unspecified: Secondary | ICD-10-CM

## 2021-09-08 DIAGNOSIS — R7303 Prediabetes: Secondary | ICD-10-CM

## 2021-09-08 NOTE — Telephone Encounter (Signed)
Patient has a lab appt 5/24, there are no orders in. ?

## 2021-09-09 NOTE — Addendum Note (Signed)
Addended by: Crecencio Mc on: 09/09/2021 12:50 PM   Modules accepted: Orders

## 2021-09-12 ENCOUNTER — Ambulatory Visit
Admission: RE | Admit: 2021-09-12 | Discharge: 2021-09-12 | Disposition: A | Discharge status: Home / Self Care | Payer: Medicare Other | Source: Ambulatory Visit | Attending: Internal Medicine | Admitting: Internal Medicine

## 2021-09-12 DIAGNOSIS — M4802 Spinal stenosis, cervical region: Secondary | ICD-10-CM | POA: Insufficient documentation

## 2021-09-12 DIAGNOSIS — M542 Cervicalgia: Secondary | ICD-10-CM | POA: Insufficient documentation

## 2021-09-15 ENCOUNTER — Other Ambulatory Visit (INDEPENDENT_AMBULATORY_CARE_PROVIDER_SITE_OTHER): Payer: Medicare Other

## 2021-09-15 ENCOUNTER — Telehealth: Payer: Self-pay | Admitting: Internal Medicine

## 2021-09-15 DIAGNOSIS — E039 Hypothyroidism, unspecified: Secondary | ICD-10-CM | POA: Diagnosis not present

## 2021-09-15 DIAGNOSIS — R7303 Prediabetes: Secondary | ICD-10-CM

## 2021-09-15 DIAGNOSIS — M4802 Spinal stenosis, cervical region: Secondary | ICD-10-CM | POA: Insufficient documentation

## 2021-09-15 DIAGNOSIS — E785 Hyperlipidemia, unspecified: Secondary | ICD-10-CM

## 2021-09-15 DIAGNOSIS — M5412 Radiculopathy, cervical region: Secondary | ICD-10-CM

## 2021-09-15 LAB — COMPREHENSIVE METABOLIC PANEL
ALT: 28 U/L (ref 0–35)
AST: 36 U/L (ref 0–37)
Albumin: 4.2 g/dL (ref 3.5–5.2)
Alkaline Phosphatase: 73 U/L (ref 39–117)
BUN: 18 mg/dL (ref 6–23)
CO2: 29 mEq/L (ref 19–32)
Calcium: 9.6 mg/dL (ref 8.4–10.5)
Chloride: 103 mEq/L (ref 96–112)
Creatinine, Ser: 0.92 mg/dL (ref 0.40–1.20)
GFR: 62.35 mL/min (ref >60.00)
Glucose, Bld: 105 mg/dL — ABNORMAL HIGH (ref 70–99)
Potassium: 3.9 mEq/L (ref 3.5–5.1)
Sodium: 140 mEq/L (ref 135–145)
Total Bilirubin: 0.5 mg/dL (ref 0.2–1.2)
Total Protein: 6.8 g/dL (ref 6.0–8.3)

## 2021-09-15 LAB — LIPID PANEL
Cholesterol: 147 mg/dL (ref 0–200)
HDL: 64.2 mg/dL (ref >39.00)
LDL Cholesterol: 71 mg/dL (ref 0–99)
NonHDL: 82.61
Total CHOL/HDL Ratio: 2
Triglycerides: 59 mg/dL (ref 0.0–149.0)
VLDL: 11.8 mg/dL (ref 0.0–40.0)

## 2021-09-15 LAB — HEMOGLOBIN A1C: Hgb A1c MFr Bld: 6.1 % (ref 4.6–6.5)

## 2021-09-15 LAB — TSH: TSH: 1.47 u[IU]/mL (ref 0.35–5.50)

## 2021-09-15 NOTE — Telephone Encounter (Signed)
Pt is aware that referral has been placed.  

## 2021-09-15 NOTE — Telephone Encounter (Signed)
Pt stated she had her MRI already done and she was suppose to get shots in her neck. Someone called her regarding this but she does not know who and they did not leave a voicemail. Pt want to reschedule the shots

## 2021-09-15 NOTE — Telephone Encounter (Signed)
Pt called wanting her neurosurgeon to be set up in [place]

## 2021-09-15 NOTE — Telephone Encounter (Signed)
Since pt is being referred to a neurosurgeon does she still the referral to the pain clinic for the neck injections or should she wait to see what the neurosurgeon recommends?

## 2021-09-17 ENCOUNTER — Ambulatory Visit: Payer: Medicare Other | Admitting: Internal Medicine

## 2021-09-22 ENCOUNTER — Encounter: Payer: Self-pay | Admitting: Internal Medicine

## 2021-09-22 ENCOUNTER — Ambulatory Visit (INDEPENDENT_AMBULATORY_CARE_PROVIDER_SITE_OTHER): Payer: Medicare Other | Admitting: Internal Medicine

## 2021-09-22 DIAGNOSIS — E785 Hyperlipidemia, unspecified: Secondary | ICD-10-CM | POA: Diagnosis not present

## 2021-09-22 DIAGNOSIS — M4802 Spinal stenosis, cervical region: Secondary | ICD-10-CM | POA: Diagnosis not present

## 2021-09-22 DIAGNOSIS — M542 Cervicalgia: Secondary | ICD-10-CM

## 2021-09-22 MED ORDER — TRAMADOL HCL 50 MG PO TABS: 50.0000 mg | ORAL_TABLET | Freq: Four times a day (QID) | ORAL | 2 refills | Status: AC | PRN

## 2021-09-22 NOTE — Assessment & Plan Note (Signed)
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  She has no weakness but has pain and numbness currently managed with tramadol, gabapentin and tizanidien.  Awaiting appt with Dr Izora Ribas for discussion of options.  She declines PT at this time .  Advised to keep neck in neutral position with all activities

## 2021-09-22 NOTE — Progress Notes (Signed)
Subjective:  Patient ID: Sarah [name], female    DOB: 14-Mar-1951  Age: 71 y.o. MRN: [id number]  CC: Diagnoses of Acute neck pain, Hyperlipidemia, unspecified hyperlipidemia type, and Spinal stenosis, cervical region were pertinent to this visit.   HPI [name] presents for follow up on htn hld,  prediabetes Chief Complaint  Patient presents with   Follow-up    3 month follow up on hypertension and hyperlipidemia   1)   Hypertension: patient checks blood pressure twice weekly at home.  Readings have been for the most part > 140/80 at rest . Patient is following a reduce salt diet most days and is taking medications as prescribed   2) Hyperlipidemia : taking rosuvastatin 10 mg daily   .3) cervical spinal stenosis:  multi level from C4-C8  .    Using gabapentin,  tizanidine and tramadol.  Wakes up with left arm tingling which resolves with motion of arm   not dropping anything ,  no strenth loss in arms   Develops severe lower back pain , non radiating with short walks   (20 ft or less)  denies saddles anesthesia,  leg pai n radiatio n  4) Prediabetes :  has reduced her intake of sugary soft drinks to one 7 up daily Outpatient Medications Prior to Visit  Medication Sig Dispense Refill   albuterol (VENTOLIN HFA) 108 (90 Base) MCG/ACT inhaler Inhale 2 puffs into the lungs every 6 (six) hours as needed for wheezing or shortness of breath. 3 each 3   amLODipine (NORVASC) 5 MG tablet Take 1 tablet (5 mg total) by mouth daily. 90 tablet 3   budesonide (PULMICORT) 0.25 MG/2ML nebulizer solution Take 2 mLs (0.25 mg total) by nebulization 2 (two) times daily. 60 mL 12   Calcium Citrate-Vitamin D (CALCIUM CITRATE + D PO) Take 1 tablet by mouth 2 (two) times daily.     citalopram (CELEXA) 20 MG tablet Take 1 tablet (20 mg total) by mouth daily. 90 tablet 3   famotidine (PEPCID) 20 MG tablet TAKE 1 TABLET BY MOUTH DAILY. BEFORE DINNER 30 tablet 11   fexofenadine (ALLEGRA) 180 MG tablet Take  180 mg by mouth daily.     Fluticasone-Umeclidin-Vilant (TRELEGY ELLIPTA) 100-62.5-25 MCG/ACT AEPB Inhale 1 puff into the lungs daily. 1 each 11   gabapentin (NEURONTIN) 100 MG capsule Take 1 capsule (100 mg total) by mouth 3 (three) times daily. (Patient taking differently: Take 100 mg by mouth daily.) 270 capsule 3   ipratropium-albuterol (DUONEB) 0.5-2.5 (3) MG/3ML SOLN Take 3 mLs by nebulization every 6 (six) hours as needed. 360 mL 2   levothyroxine (SYNTHROID) 50 MCG tablet TAKE 1 TABLET EVERY DAY ON EMPTY STOMACHWITH A GLASS OF WATER AT LEAST 30-60 MINBEFORE BREAKFAST 90 tablet 3   mirabegron ER (MYRBETRIQ) 50 MG TB24 tablet Take 1 tablet (50 mg total) by mouth daily. 30 tablet 5   pantoprazole (PROTONIX) 40 MG tablet Take 1 tablet (40 mg total) by mouth daily. 90 tablet 3   rosuvastatin (CRESTOR) 10 MG tablet Take 1 tablet (10 mg total) by mouth daily. 90 tablet 3   tiZANidine (ZANAFLEX) 2 MG tablet Take 1 tablet (2 mg total) by mouth every 6 (six) hours as needed for muscle spasms. 120 tablet 2   traMADol (ULTRAM) 50 MG tablet Take 1 tablet (50 mg total) by mouth every 6 (six) hours as needed. 30 tablet 0   traZODone (DESYREL) 50 MG tablet Take 0.5-1 tablets (25-50 mg total)  by mouth at bedtime as needed for sleep. 90 tablet 1   vitamin B-12 (CYANOCOBALAMIN) 1000 MCG tablet Take 1,000 mcg by mouth daily.     predniSONE (DELTASONE) 10 MG tablet 6 tablets daily for 3 days, then reduce by 1 tablet daily until gone (Patient not taking: Reported on 09/22/2021) 33 tablet 0   No facility-administered medications prior to visit.    Review of Systems;  Patient denies headache, fevers, malaise, unintentional weight loss, skin rash, eye pain, sinus congestion and sinus pain, sore throat, dysphagia,  hemoptysis , cough, dyspnea, wheezing, chest pain, palpitations, orthopnea, edema, abdominal pain, nausea, melena, diarrhea, constipation, flank pain, dysuria, hematuria, urinary  Frequency, nocturia,  numbness, tingling, seizures,  Focal weakness, Loss of consciousness,  Tremor, insomnia, depression, anxiety, and suicidal ideation.      Objective:  BP 132/62 (BP Location: Left Arm, Patient Position: Sitting, Cuff Size: Normal)   Pulse 89   Temp 98 F (36.7 C) (Oral)   Ht '5\' 3"'$  (1.6 m)   Wt 176 lb 3.2 oz (79.9 kg)   SpO2 98%   BMI 31.21 kg/m   BP Readings from Last 3 Encounters:  09/22/21 132/62  08/31/21 128/74  08/27/21 130/65    Wt Readings from Last 3 Encounters:  09/22/21 176 lb 3.2 oz (79.9 kg)  08/31/21 178 lb 3.2 oz (80.8 kg)  08/27/21 179 lb 3.2 oz (81.3 kg)    General appearance: alert, cooperative and appears stated age Ears: normal TM's and external ear canals both ears Throat: lips, mucosa, and tongue normal; teeth and gums normal Neck: no adenopathy, no carotid bruit, supple, symmetrical, trachea midline and thyroid not enlarged, symmetric, no tenderness/mass/nodules Back: symmetric, no curvature. ROM normal. No CVA tenderness. Lungs: clear to auscultation bilaterally Heart: regular rate and rhythm, S1, S2 normal, no murmur, click, rub or gallop Abdomen: soft, non-tender; bowel sounds normal; no masses,  no organomegaly Pulses: 2+ and symmetric Skin: Skin color, texture, turgor normal. No rashes or lesions Lymph nodes: Cervical, supraclavicular, and axillary nodes normal. Neuro: CNs 2-12 intact. DTRs 2+/4 in biceps, brachioradialis, patellars and achilles. Muscle strength 5/5 in upper and lower exremities. Fine resting tremor bilaterally both hands cerebellar function normal. Romberg negative.  No pronator drift.   Gait normal.    Lab Results  Component Value Date   HGBA1C 6.1 09/15/2021   HGBA1C 6.0 06/07/2021   HGBA1C 6.4 12/10/2020    Lab Results  Component Value Date   CREATININE 0.92 09/15/2021   CREATININE 0.80 08/27/2021   CREATININE 0.89 08/06/2021    Lab Results  Component Value Date   WBC 4.3 08/27/2021   HGB 11.7 (L) 08/27/2021    HCT 34.3 (L) 08/27/2021   PLT 152 08/27/2021   GLUCOSE 105 (H) 09/15/2021   CHOL 147 09/15/2021   TRIG 59.0 09/15/2021   HDL 64.20 09/15/2021   LDLCALC 71 09/15/2021   ALT 28 09/15/2021   AST 36 09/15/2021   NA 140 09/15/2021   K 3.9 09/15/2021   CL 103 09/15/2021   CREATININE 0.92 09/15/2021   BUN 18 09/15/2021   CO2 29 09/15/2021   TSH 1.47 09/15/2021   HGBA1C 6.1 09/15/2021   MICROALBUR <0.7 06/07/2021    MR Cervical Spine Wo Contrast  Result Date: 09/13/2021 CLINICAL DATA:  Neck pain of over 3 months duration M54.2 (ICD-10-CM). EXAM: MRI CERVICAL SPINE WITHOUT CONTRAST TECHNIQUE: Multiplanar, multisequence MR imaging of the cervical spine was performed. No intravenous contrast was administered. COMPARISON:  Radiographs April 06, 2021. FINDINGS: Alignment: Mild reversal of the cervical curvature. Vertebrae: No fracture, evidence of discitis, or bone lesion. Congenitally small spinal canal. Endplate degenerative changes from C3-4 through C5-6. Cord: Mass effect on the cord at C3-4, C4-5, C5-6 and C6-7 with subtle increased T2 signal within the cord at C3-4 and C4-5. Posterior Fossa, vertebral arteries, paraspinal tissues: Negative. Disc levels: C2-3: Posterior disc osteophyte complex resulting in mild spinal canal stenosis. Uncovertebral and facet degenerative change resulting in mild bilateral neural foraminal narrowing. C3-4: Posterior disc osteophyte complex resulting in moderate spinal canal stenosis. Uncovertebral and facet degenerative changes resulting in severe right and moderate left neural foraminal narrowing. C4-5: Posterior disc osteophyte complex resulting in moderate spinal canal stenosis. Uncovertebral and facet degenerative change resulting severe bilateral neural foraminal narrowing narrowing. C5-6: Posterior disc osteophyte complex resulting in moderate spinal canal stenosis. Uncovertebral and facet degenerative changes resulting in severe bilateral neural foraminal  narrowing. C6-7: Posterior disc osteophyte complex resulting moderate spinal canal stenosis. Uncovertebral and facet degenerative changes resulting moderate bilateral foraminal narrowing. C7-T1: Small posterior disc protrusion without significant spinal canal stenosis. Uncovertebral and facet degenerative changes resulting in mild bilateral neural foraminal narrowing. IMPRESSION: 1. 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 2. Subtle increase of the T2 signal at C3-4 and C4-5 may represent edema. 3. High-grade bilateral neural foraminal narrowing from C3-4 through C6-7. Electronically Signed   By: Pedro Earls M.D.   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  She has no weakness but has pain and numbness currently managed with tramadol, gabapentin and tizanidien.  Awaiting appt with Dr Izora Ribas for discussion of options.  She declines PT at this time .  Advised to keep neck in neutral position with all activities        Hyperlipidemia    LDL and triglycerides are at goal on current medications. she has no side effects and liver enzymes are normal. 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  She has no weakness  but has pain and numbness currently managed with tramadol, gabapentin and tizanidien.  Awaiting appt with Dr Izora Ribas for discussion of options       I spent a total of  30 minutes with this patient in a face to face visit on the date of this encounter reviewing the last office visit with me on  May 23,  m patient'ss diet and eating habits, home blood pressure readings ,  the MRI of her cervical spine , communicating with referral center.   and post visit ordering of testing and therapeutics.    Follow-up: No  follow-ups on file.   Crecencio Mc, MD

## 2021-09-22 NOTE — Assessment & Plan Note (Signed)
LDL and triglycerides are at goal on current medications. she has no side effects and liver enzymes are normal. No changes today   Lab Results  Component Value Date   CHOL 147 09/15/2021   HDL 64.20 09/15/2021   LDLCALC 71 09/15/2021   TRIG 59.0 09/15/2021   CHOLHDL 2 09/15/2021   Lab Results  Component Value Date   ALT 28 09/15/2021   AST 36 09/15/2021   ALKPHOS 73 09/15/2021   BILITOT 0.5 09/15/2021

## 2021-09-22 NOTE — Assessment & Plan Note (Signed)
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  She has no weakness but has pain and numbness currently managed with tramadol, gabapentin and tizanidien.  Awaiting appt with Dr Izora Ribas for discussion of options

## 2021-09-22 NOTE — Patient Instructions (Signed)
No more activities that keep your head bowed .  This is making your neck issues worse  Always support your neck in a neutral position (looking straight ahead)  Continue gabapentin tramadol and tizanidine

## 2021-10-04 ENCOUNTER — Telehealth: Payer: Self-pay | Admitting: Cardiovascular Disease

## 2021-10-04 ENCOUNTER — Telehealth: Payer: Self-pay | Admitting: Internal Medicine

## 2021-10-04 NOTE — Telephone Encounter (Signed)
Pt is currently using the 100-62.5-25 mcg/act. All we have in the office is the 200-625-25 mcg/act.

## 2021-10-04 NOTE — Telephone Encounter (Signed)
  Pt c/o medication issue:  1. Name of Medication: Fluticasone-Umeclidin-Vilant (TRELEGY ELLIPTA) 100-62.5-25 MCG/ACT AEPB  2. How are you currently taking this medication (dosage and times per day)?  As directed  3. Are you having a reaction (difficulty breathing--STAT)?  No  4. What is your medication issue? Patient states that her copay went up to $180.00 and she cannot afford that. She would like to know if she can get samples or assistance with getting this medication. Please advise.

## 2021-10-04 NOTE — Telephone Encounter (Signed)
Pt called stating that TRELEGY ELLIPTA is to expensive for her and she would like to see if we have any samples to give her

## 2021-10-05 NOTE — Telephone Encounter (Signed)
Pt is aware that sample is available for pick up.

## 2021-10-05 NOTE — Telephone Encounter (Signed)
Medication Samples have been provided to the patient.  Drug name: Trelegy Ellipta       Strength: 200 mcg/ 62.5 mcg/ 25 mcg        Qty: 1 box  LOT: [id number]  Exp.Date: 09/24/2022  Dosing instructions: Inhale 1 puff into lungs one time daily.   The patient has been instructed regarding the correct time, dose, and frequency of taking this medication, including desired effects and most common side effects.   Sarah [name] 3:06 PM 10/05/2021

## 2021-10-11 ENCOUNTER — Telehealth: Payer: Self-pay | Admitting: Internal Medicine

## 2021-10-11 NOTE — Telephone Encounter (Signed)
FYI   Spoke with pt and she stated that Dr. Cari Caraway will be leaving [facility] but isn't sure where he will be going yet other thatn it is across the street. Pt stated that she is going to keep her next appt at [facility] so she can find out where he went.

## 2021-10-11 NOTE — Telephone Encounter (Signed)
Pt would like to be called regarding Dr. Cari Caraway

## 2021-10-13 ENCOUNTER — Telehealth: Payer: Self-pay

## 2021-10-13 NOTE — Telephone Encounter (Signed)
Medication Samples have been provided to the patient.  Drug name: Trelegy Ellipta       Strength: 100 mcg/62.5 mcg/25 mcg        Qty: 2 boxes  LOT: [id number]  Exp.Date: 02/24/2023  Dosing instructions: Inhale 1 puff daily.   The patient has been instructed regarding the correct time, dose, and frequency of taking this medication, including desired effects and most common side effects.   Sarah [name] 5:07 PM 10/13/2021

## 2021-10-14 ENCOUNTER — Telehealth: Payer: Self-pay | Admitting: Pharmacist

## 2021-10-14 NOTE — Progress Notes (Signed)
[facility]) [place]   10/14/2021  Sarah [name] 1950/09/27 [id number]   Reason for referral: medication assistance  Referral source: Provider (Dr. Derrel Nip) Current insurance:[employer]  HPI: 70 Year old female with multiple medical conditions including but not limited to:  COPD, depression,asthma, hyperlipidemia, prediabetes, GERD and Hypertension.  She reported problems affording Trelegy and Myrbetriq as she is now in the "donut hole".   Objective: Lab Results  Component Value Date   CREATININE 0.92 09/15/2021   CREATININE 0.80 08/27/2021   CREATININE 0.89 08/06/2021    Lab Results  Component Value Date   HGBA1C 6.1 09/15/2021    Lipid Panel     Component Value Date/Time   CHOL 147 09/15/2021 0758   TRIG 59.0 09/15/2021 0758   HDL 64.20 09/15/2021 0758   CHOLHDL 2 09/15/2021 0758   VLDL 11.8 09/15/2021 0758   LDLCALC 71 09/15/2021 0758   LDLCALC 142 (H) 04/06/2018 1520    BP Readings from Last 3 Encounters:  09/22/21 132/62  08/31/21 128/74  08/27/21 130/65    No Known Allergies  Medications Reviewed Today     Reviewed by Adair Laundry, CMA (Certified Medical Assistant) on 09/22/21 at [place] List Status: <None>   Medication Order Taking? Sig Documenting Provider Last Dose Status Informant  albuterol (VENTOLIN HFA) 108 (90 Base) MCG/ACT inhaler [id number] Yes Inhale 2 puffs into the lungs every 6 (six) hours as needed for wheezing or shortness of breath. Crecencio Mc, MD Taking Active   amLODipine (NORVASC) 5 MG tablet [id number] Yes Take 1 tablet (5 mg total) by mouth daily. Crecencio Mc, MD Taking Active   budesonide (PULMICORT) 0.25 MG/2ML nebulizer solution [id number] Yes Take 2 mLs (0.25 mg total) by nebulization 2 (two) times daily. McLean-Scocuzza, Nino Glow, MD Taking Active   Calcium Citrate-Vitamin D (CALCIUM CITRATE + D PO) [id number] Yes Take 1 tablet by mouth 2 (two) times daily.  [provider] Taking Active   citalopram (CELEXA) 20 MG tablet [id number] Yes Take 1 tablet (20 mg total) by mouth daily. Crecencio Mc, MD Taking Active   famotidine (PEPCID) 20 MG tablet [id number] Yes TAKE 1 TABLET BY MOUTH DAILY. BEFORE DINNER Crecencio Mc, MD Taking Active   fexofenadine [facility]) 180 MG tablet [id number] Yes Take 180 mg by mouth daily. [provider] Taking Active Self  Fluticasone-Umeclidin-Vilant (TRELEGY ELLIPTA) 100-62.5-25 MCG/ACT AEPB [id number] Yes Inhale 1 puff into the lungs daily. Crecencio Mc, MD Taking Active   gabapentin (NEURONTIN) 100 MG capsule [id number] Yes Take 1 capsule (100 mg total) by mouth 3 (three) times daily.  Patient taking differently: Take 100 mg by mouth daily.   Crecencio Mc, MD Taking Active   ipratropium-albuterol (DUONEB) 0.5-2.5 (3) MG/3ML SOLN [id number] Yes Take 3 mLs by nebulization every 6 (six) hours as needed. McLean-Scocuzza, Nino Glow, MD Taking Active   levothyroxine (SYNTHROID) 50 MCG tablet [id number] Yes TAKE 1 TABLET EVERY DAY ON EMPTY STOMACHWITH A GLASS OF WATER AT LEAST 30-60 MINBEFORE BREAKFAST Crecencio Mc, MD Taking Active   mirabegron ER (MYRBETRIQ) 50 MG TB24 tablet [id number] Yes Take 1 tablet (50 mg total) by mouth daily. Crecencio Mc, MD Taking Active   pantoprazole (PROTONIX) 40 MG tablet [id number] Yes Take 1 tablet (40 mg total) by mouth daily. Crecencio Mc, MD Taking Active   rosuvastatin (CRESTOR) 10 MG tablet [id number] Yes Take 1 tablet (10 mg total) by mouth daily. Derrel Nip,  Aris Everts, MD Taking Active   tiZANidine (ZANAFLEX) 2 MG tablet [id number] Yes Take 1 tablet (2 mg total) by mouth every 6 (six) hours as needed for muscle spasms. Crecencio Mc, MD Taking Active   traMADol Veatrice Bourbon) 50 MG tablet [id number] Yes Take 1 tablet (50 mg total) by mouth every 6 (six) hours as needed. Crecencio Mc, MD Taking Active   traZODone (DESYREL) 50 MG tablet [id number] Yes Take 0.5-1 tablets (25-50  mg total) by mouth at bedtime as needed for sleep. Crecencio Mc, MD Taking Active   vitamin B-12 (CYANOCOBALAMIN) 1000 MCG tablet [id number] Yes Take 1,000 mcg by mouth daily. [provider] Taking Active              ASSESSMENT: Spoke with patient. HIPAA identifiers were obtained.  Patient expressed concern with the cost of Trelegy and Myrbetriq.  Unfortunately, patients with Medicare are not eligible for the program for Myrbetriq but [employer] does have a program that offers Trelegy.    Based on the patient's reported income, it looked like she may qualify for extra help.  However, her assets were over the limit. An application was done anyway and the patient was told to be on the look out for a determination letter and to hold on to it.    Patient communicated understanding about the necessary financial documentation that will be required to complete her application along with proof of her out of pocket medication expenses. ([employer] requires patients to spend $600 in out-of-pocket medication expenses to qualify for their program.)   Plan: Route note to PCP. Send application to patient.   Jill Simcox, CPhT will complete the mediation assistance process.    Elayne Guerin, PharmD, [place] Clinical Pharmacist [phone]

## 2021-10-15 DIAGNOSIS — G959 Disease of spinal cord, unspecified: Secondary | ICD-10-CM | POA: Diagnosis not present

## 2021-10-15 DIAGNOSIS — M542 Cervicalgia: Secondary | ICD-10-CM | POA: Diagnosis not present

## 2021-10-17 ENCOUNTER — Other Ambulatory Visit: Payer: Self-pay | Admitting: Internal Medicine

## 2021-10-19 ENCOUNTER — Telehealth: Payer: Self-pay | Admitting: Pharmacy Technician

## 2021-10-19 DIAGNOSIS — Z596 Low income: Secondary | ICD-10-CM

## 2021-10-19 NOTE — Progress Notes (Signed)
[facility] Customer service manager [facility])                                            [facility] Quality Pharmacy Team    10/19/2021  Sarah [name] [birth year] [id number]                                      Medication Assistance Referral  Referral From: [facility] Katina B.  Medication/Company: Trelegy / [employer] Patient application portion:  Mailed Provider application portion: Faxed  to Dr. Duncan Dull Provider address/fax verified via: Office website   [name] P. [name], CPhT [facility] [employer]  [phone]

## 2021-10-20 ENCOUNTER — Other Ambulatory Visit: Payer: Self-pay

## 2021-10-20 DIAGNOSIS — J4540 Moderate persistent asthma, uncomplicated: Secondary | ICD-10-CM

## 2021-10-20 MED ORDER — TRELEGY ELLIPTA 100-62.5-25 MCG/ACT IN AEPB: 1.0000 | INHALATION_SPRAY | Freq: Every day | RESPIRATORY_TRACT | 11 refills | Status: DC

## 2021-10-21 DIAGNOSIS — G959 Disease of spinal cord, unspecified: Secondary | ICD-10-CM | POA: Diagnosis not present

## 2021-10-21 DIAGNOSIS — M4802 Spinal stenosis, cervical region: Secondary | ICD-10-CM | POA: Diagnosis not present

## 2021-10-21 DIAGNOSIS — R29818 Other symptoms and signs involving the nervous system: Secondary | ICD-10-CM | POA: Diagnosis not present

## 2021-10-22 ENCOUNTER — Other Ambulatory Visit: Payer: Self-pay | Admitting: Neurosurgery

## 2021-10-25 ENCOUNTER — Telehealth: Payer: Self-pay | Admitting: Internal Medicine

## 2021-10-25 MED ORDER — OXYBUTYNIN CHLORIDE ER 10 MG PO TB24: 10.0000 mg | ORAL_TABLET | Freq: Every day | ORAL | 2 refills | Status: DC

## 2021-10-25 NOTE — Telephone Encounter (Signed)
The price of the patient's mirabegron ER (MYRBETRIQ) 50 MG TB24 tablet has gone up and she is needing something less expensive.

## 2021-10-25 NOTE — Telephone Encounter (Signed)
[facility]

## 2021-10-25 NOTE — Telephone Encounter (Signed)
Pt is aware of the medication that was sent in.

## 2021-11-03 ENCOUNTER — Encounter
Admission: RE | Admit: 2021-11-03 | Discharge: 2021-11-03 | Disposition: A | Discharge status: Home / Self Care | Payer: Medicare Other | Source: Ambulatory Visit | Attending: Neurosurgery | Admitting: Neurosurgery

## 2021-11-03 VITALS — BP 128/62 | HR 71 | Temp 98.0°F | Resp 20 | Ht 63.0 in | Wt 174.4 lb

## 2021-11-03 DIAGNOSIS — I10 Essential (primary) hypertension: Secondary | ICD-10-CM | POA: Diagnosis not present

## 2021-11-03 DIAGNOSIS — M4802 Spinal stenosis, cervical region: Secondary | ICD-10-CM | POA: Diagnosis not present

## 2021-11-03 DIAGNOSIS — Z01818 Encounter for other preprocedural examination: Secondary | ICD-10-CM | POA: Diagnosis not present

## 2021-11-03 DIAGNOSIS — Z01810 Encounter for preprocedural cardiovascular examination: Secondary | ICD-10-CM | POA: Diagnosis not present

## 2021-11-03 HISTORY — DX: Hypothyroidism, unspecified: E03.9

## 2021-11-03 LAB — TYPE AND SCREEN
ABO/RH(D): A POS
Antibody Screen: NEGATIVE

## 2021-11-03 LAB — SURGICAL PCR SCREEN
MRSA, PCR: NEGATIVE
Staphylococcus aureus: NEGATIVE

## 2021-11-03 NOTE — Patient Instructions (Addendum)
Your procedure is scheduled on: Wednesday November 10, 2021. Report to Day Surgery inside [facility] 2nd floor, stop by admissions desk before getting on elevator. To find out your arrival time please call [phone] between 1PM - 3PM on Tuesday November 09, 2021.  Remember: Instructions that are not followed completely may result in serious medical risk,  up to and including death, or upon the discretion of your surgeon and anesthesiologist your  surgery may need to be rescheduled.     _X__ 1. Do not eat food after midnight the night before your procedure.                 No chewing gum or hard candies. You may drink clear liquids up to 2 hours                 before you are scheduled to arrive for your surgery- DO not drink clear                 liquids within 2 hours of the start of your surgery.                 Clear Liquids include:  water, apple juice without pulp, clear Gatorade, G2 or                  Gatorade Zero (avoid Red/Purple/Blue), Black Coffee or Tea (Do not add                 anything to coffee or tea).  __X__2.  On the morning of surgery brush your teeth with toothpaste and water, you                may rinse your mouth with mouthwash if you wish.  Do not swallow any toothpaste or mouthwash.     _X__ 3.  No Alcohol for 24 hours before or after surgery.   _X__ 4.  Do Not Smoke or use e-cigarettes For 24 Hours Prior to Your Surgery.                 Do not use any chewable tobacco products for at least 6 hours prior to                 Surgery.  _X__  5.  Do not use any recreational drugs (marijuana, cocaine, heroin, ecstasy, MDMA or other)                For at least one week prior to your surgery.  Combination of these drugs with anesthesia                May have life threatening results.  ____  6.  Bring all medications with you on the day of surgery if instructed.   __X__  7.  Notify your doctor if there is any change in your medical condition       (cold, fever, infections).     Do not wear jewelry, make-up, hairpins, clips or nail polish. Do not wear lotions, powders, or perfumes. You may wear deodorant. Do not shave 48 hours prior to surgery. Men may shave face and neck. Do not bring valuables to the hospital.    [facility] is not responsible for any belongings or valuables.  Contacts, dentures or bridgework may not be worn into surgery. Leave your suitcase in the car. After surgery it may be brought to your room. For patients admitted to the hospital, discharge time is determined  by your treatment team.   Patients discharged the day of surgery will not be allowed to drive home.   Make arrangements for someone to be with you for the first 24 hours of your Same Day Discharge.   __X__ Take these medicines the morning of surgery with A SIP OF WATER:    1. amLODipine (NORVASC) 5 MG  2. gabapentin (NEURONTIN) 100 MG   3. levothyroxine (SYNTHROID) 50 MCG   4. pantoprazole (PROTONIX) 40 MG  5. citalopram (CELEXA) 20 MG  6. rosuvastatin (CRESTOR) 10 MG  ____ Fleet Enema (as directed)   __X__ Use CHG Soap (or wipes) as directed  ____ Use Benzoyl Peroxide Gel as instructed  __X__ Use inhalers on the day of surgery  Fluticasone-Umeclidin-Vilant (TRELEGY ELLIPTA) 100-62.5-25 MCG/ACT AEPB  albuterol (VENTOLIN HFA) 108 (90 Base) MCG/ACT inhaler  ____ Stop metformin 2 days prior to surgery    ____ Take 1/2 of usual insulin dose the night before surgery. No insulin the morning          of surgery.   ____ Call your PCP, cardiologist, or Pulmonologist if taking Coumadin/Plavix/aspirin and ask when to stop before your surgery.   __X__ One Week prior to surgery- Stop Anti-inflammatories such as Ibuprofen, Aleve, Advil, Motrin, meloxicam (MOBIC), diclofenac, etodolac, ketorolac, Toradol, Daypro, piroxicam, Goody's or BC powders. OK TO USE TYLENOL IF NEEDED   __X__ Stop supplements until after surgery.    ____ Bring C-Pap to  the hospital.    If you have any questions regarding your pre-procedure instructions,  Please call Pre-admit Testing at [phone]

## 2021-11-09 ENCOUNTER — Telehealth: Payer: Self-pay

## 2021-11-09 NOTE — Telephone Encounter (Signed)
Spoke with pt to let her know that we have wo samples of the Trelegy 100 mcg. Pt stated that she would be by to pick them up today.   Medication Samples have been provided to the patient.  Drug name: Trelegy Ellipta       Strength: 100 mcg/62.5 mcg/25 mcg        Qty: 2  LOT: [id number]  Exp.Date: 02/2023  Dosing instructions: Inhale 1 puff into lungs daily.   The patient has been instructed regarding the correct time, dose, and frequency of taking this medication, including desired effects and most common side effects.   Miguel Medal 2:44 PM 11/09/2021

## 2021-11-09 NOTE — Telephone Encounter (Signed)
Patient states she is in the donut hole with her insurance and would like to see if we are able to give her samples of Trelegy (Ellipta) 100-62.5-25.  Patient states she is having neck surgery tomorrow, so she will see if she can get someone to come pick the samples up for her if we have them available.

## 2021-11-10 ENCOUNTER — Inpatient Hospital Stay: Payer: Medicare Other | Admitting: Urgent Care

## 2021-11-10 ENCOUNTER — Inpatient Hospital Stay
Admission: RE | Admit: 2021-11-10 | Discharge: 2021-11-12 | DRG: 473 | Disposition: A | Discharge status: Home / Self Care | Payer: Medicare Other | Attending: Neurosurgery | Admitting: Neurosurgery

## 2021-11-10 ENCOUNTER — Encounter
Admission: RE | Disposition: A | Discharge status: Home / Self Care | Payer: Self-pay | Source: Home / Self Care | Attending: Neurosurgery

## 2021-11-10 ENCOUNTER — Encounter: Payer: Self-pay | Admitting: Neurosurgery

## 2021-11-10 ENCOUNTER — Inpatient Hospital Stay: Payer: Medicare Other

## 2021-11-10 ENCOUNTER — Other Ambulatory Visit: Payer: Self-pay

## 2021-11-10 DIAGNOSIS — G959 Disease of spinal cord, unspecified: Secondary | ICD-10-CM | POA: Diagnosis not present

## 2021-11-10 DIAGNOSIS — Z981 Arthrodesis status: Secondary | ICD-10-CM | POA: Diagnosis not present

## 2021-11-10 DIAGNOSIS — M4712 Other spondylosis with myelopathy, cervical region: Secondary | ICD-10-CM | POA: Diagnosis not present

## 2021-11-10 DIAGNOSIS — I10 Essential (primary) hypertension: Secondary | ICD-10-CM | POA: Diagnosis present

## 2021-11-10 DIAGNOSIS — R29818 Other symptoms and signs involving the nervous system: Secondary | ICD-10-CM | POA: Diagnosis not present

## 2021-11-10 DIAGNOSIS — J45909 Unspecified asthma, uncomplicated: Secondary | ICD-10-CM | POA: Diagnosis present

## 2021-11-10 DIAGNOSIS — M4802 Spinal stenosis, cervical region: Secondary | ICD-10-CM | POA: Diagnosis present

## 2021-11-10 DIAGNOSIS — Z79899 Other long term (current) drug therapy: Secondary | ICD-10-CM | POA: Diagnosis not present

## 2021-11-10 DIAGNOSIS — K219 Gastro-esophageal reflux disease without esophagitis: Secondary | ICD-10-CM | POA: Diagnosis present

## 2021-11-10 DIAGNOSIS — E039 Hypothyroidism, unspecified: Secondary | ICD-10-CM | POA: Diagnosis present

## 2021-11-10 HISTORY — PX: ANTERIOR CERVICAL DECOMPRESSION/DISCECTOMY FUSION 4 LEVELS: SHX5556

## 2021-11-10 SURGERY — ANTERIOR CERVICAL DECOMPRESSION/DISCECTOMY FUSION 4 LEVELS
Anesthesia: General | Site: Spine Cervical

## 2021-11-10 MED ORDER — CEFAZOLIN SODIUM-DEXTROSE 2-4 GM/100ML-% IV SOLN
2.0000 g | Freq: Once | INTRAVENOUS | Status: AC
Start: 2021-11-10 — End: 2021-11-10
  Administered 2021-11-10: 2 g via INTRAVENOUS

## 2021-11-10 MED ORDER — VITAMIN B-12 1000 MCG PO TABS
1000.0000 ug | ORAL_TABLET | Freq: Every day | ORAL | Status: DC
  Administered 2021-11-11 – 2021-11-12 (×2): 1000 ug via ORAL
  Filled 2021-11-10 (×2): qty 1

## 2021-11-10 MED ORDER — ACETAMINOPHEN 500 MG PO TABS
1000.0000 mg | ORAL_TABLET | Freq: Four times a day (QID) | ORAL | Status: DC
  Administered 2021-11-10 – 2021-11-12 (×6): 1000 mg via ORAL
  Filled 2021-11-10 (×6): qty 2

## 2021-11-10 MED ORDER — OXYCODONE HCL 5 MG/5ML PO SOLN: 5.0000 mg | Freq: Once | ORAL | Status: DC | PRN

## 2021-11-10 MED ORDER — METHOCARBAMOL 1000 MG/10ML IJ SOLN: 500.0000 mg | Freq: Four times a day (QID) | INTRAVENOUS | Status: DC | PRN

## 2021-11-10 MED ORDER — ACETAMINOPHEN 10 MG/ML IV SOLN
INTRAVENOUS | Status: DC | PRN
  Administered 2021-11-10: 1000 mg via INTRAVENOUS

## 2021-11-10 MED ORDER — ALBUTEROL SULFATE (2.5 MG/3ML) 0.083% IN NEBU: 2.5000 mg | INHALATION_SOLUTION | Freq: Four times a day (QID) | RESPIRATORY_TRACT | Status: DC | PRN

## 2021-11-10 MED ORDER — 0.9 % SODIUM CHLORIDE (POUR BTL) OPTIME
TOPICAL | Status: DC | PRN
  Administered 2021-11-10: 500 mL

## 2021-11-10 MED ORDER — METHOCARBAMOL 500 MG PO TABS
ORAL_TABLET | ORAL | Status: AC
  Filled 2021-11-10: qty 1

## 2021-11-10 MED ORDER — ONDANSETRON HCL 4 MG/2ML IJ SOLN
INTRAMUSCULAR | Status: DC | PRN
  Administered 2021-11-10: 4 mg via INTRAVENOUS

## 2021-11-10 MED ORDER — DEXAMETHASONE SODIUM PHOSPHATE 10 MG/ML IJ SOLN
INTRAMUSCULAR | Status: AC
  Filled 2021-11-10: qty 1

## 2021-11-10 MED ORDER — MENTHOL 3 MG MT LOZG: 1.0000 | LOZENGE | OROMUCOSAL | Status: DC | PRN

## 2021-11-10 MED ORDER — ROSUVASTATIN CALCIUM 10 MG PO TABS
10.0000 mg | ORAL_TABLET | Freq: Every day | ORAL | Status: DC
  Administered 2021-11-11 – 2021-11-12 (×2): 10 mg via ORAL
  Filled 2021-11-10 (×2): qty 1

## 2021-11-10 MED ORDER — PROPOFOL 1000 MG/100ML IV EMUL
INTRAVENOUS | Status: AC
  Filled 2021-11-10: qty 100

## 2021-11-10 MED ORDER — PHENYLEPHRINE HCL-NACL 20-0.9 MG/250ML-% IV SOLN
INTRAVENOUS | Status: DC | PRN
  Administered 2021-11-10: 25 ug/min via INTRAVENOUS

## 2021-11-10 MED ORDER — MIDAZOLAM HCL 2 MG/2ML IJ SOLN
INTRAMUSCULAR | Status: DC | PRN
  Administered 2021-11-10 (×2): 1 mg via INTRAVENOUS

## 2021-11-10 MED ORDER — OXYCODONE HCL 5 MG PO TABS: 5.0000 mg | ORAL_TABLET | Freq: Once | ORAL | Status: DC | PRN

## 2021-11-10 MED ORDER — UMECLIDINIUM BROMIDE 62.5 MCG/ACT IN AEPB
1.0000 | INHALATION_SPRAY | Freq: Every day | RESPIRATORY_TRACT | Status: DC
  Administered 2021-11-11 – 2021-11-12 (×2): 1 via RESPIRATORY_TRACT
  Filled 2021-11-10: qty 7

## 2021-11-10 MED ORDER — SUCCINYLCHOLINE CHLORIDE 200 MG/10ML IV SOSY
PREFILLED_SYRINGE | INTRAVENOUS | Status: DC | PRN
  Administered 2021-11-10: 100 mg via INTRAVENOUS

## 2021-11-10 MED ORDER — SODIUM CHLORIDE (PF) 0.9 % IJ SOLN
INTRAMUSCULAR | Status: AC
  Filled 2021-11-10: qty 20

## 2021-11-10 MED ORDER — ORAL CARE MOUTH RINSE: 15.0000 mL | Freq: Once | OROMUCOSAL | Status: AC

## 2021-11-10 MED ORDER — OXYBUTYNIN CHLORIDE ER 5 MG PO TB24
10.0000 mg | ORAL_TABLET | Freq: Every day | ORAL | Status: DC
  Administered 2021-11-10 – 2021-11-11 (×2): 10 mg via ORAL
  Filled 2021-11-10 (×2): qty 2

## 2021-11-10 MED ORDER — SENNA 8.6 MG PO TABS
1.0000 | ORAL_TABLET | Freq: Two times a day (BID) | ORAL | Status: DC
  Administered 2021-11-10 – 2021-11-12 (×3): 8.6 mg via ORAL
  Filled 2021-11-10 (×4): qty 1

## 2021-11-10 MED ORDER — LACTATED RINGERS IV SOLN: INTRAVENOUS | Status: DC

## 2021-11-10 MED ORDER — SODIUM CHLORIDE 0.9% FLUSH: 3.0000 mL | INTRAVENOUS | Status: DC | PRN

## 2021-11-10 MED ORDER — DEXAMETHASONE SODIUM PHOSPHATE 10 MG/ML IJ SOLN
INTRAMUSCULAR | Status: DC | PRN
  Administered 2021-11-10: 8 mg via INTRAVENOUS

## 2021-11-10 MED ORDER — TRAZODONE HCL 50 MG PO TABS: 25.0000 mg | ORAL_TABLET | Freq: Every evening | ORAL | Status: DC | PRN

## 2021-11-10 MED ORDER — POLYETHYLENE GLYCOL 3350 17 G PO PACK: 17.0000 g | PACK | Freq: Every day | ORAL | Status: DC | PRN

## 2021-11-10 MED ORDER — BUPIVACAINE-EPINEPHRINE (PF) 0.5% -1:200000 IJ SOLN
INTRAMUSCULAR | Status: DC | PRN
  Administered 2021-11-10: 7 mL

## 2021-11-10 MED ORDER — SUCCINYLCHOLINE CHLORIDE 200 MG/10ML IV SOSY
PREFILLED_SYRINGE | INTRAVENOUS | Status: AC
  Filled 2021-11-10: qty 10

## 2021-11-10 MED ORDER — CITALOPRAM HYDROBROMIDE 10 MG PO TABS
20.0000 mg | ORAL_TABLET | Freq: Every day | ORAL | Status: DC
  Administered 2021-11-11 – 2021-11-12 (×2): 20 mg via ORAL
  Filled 2021-11-10 (×2): qty 2

## 2021-11-10 MED ORDER — METHOCARBAMOL 500 MG PO TABS
500.0000 mg | ORAL_TABLET | Freq: Four times a day (QID) | ORAL | Status: DC | PRN
  Administered 2021-11-10: 500 mg via ORAL

## 2021-11-10 MED ORDER — PANTOPRAZOLE SODIUM 40 MG PO TBEC
40.0000 mg | DELAYED_RELEASE_TABLET | Freq: Every day | ORAL | Status: DC
  Administered 2021-11-11 – 2021-11-12 (×2): 40 mg via ORAL
  Filled 2021-11-10 (×2): qty 1

## 2021-11-10 MED ORDER — PROPOFOL 10 MG/ML IV BOLUS
INTRAVENOUS | Status: AC
  Filled 2021-11-10: qty 20

## 2021-11-10 MED ORDER — CEFAZOLIN SODIUM-DEXTROSE 2-4 GM/100ML-% IV SOLN
INTRAVENOUS | Status: AC
  Filled 2021-11-10: qty 100

## 2021-11-10 MED ORDER — PROPOFOL 500 MG/50ML IV EMUL
INTRAVENOUS | Status: DC | PRN
  Administered 2021-11-10: 130 ug/kg/min via INTRAVENOUS

## 2021-11-10 MED ORDER — FENTANYL CITRATE (PF) 100 MCG/2ML IJ SOLN
INTRAMUSCULAR | Status: AC
  Filled 2021-11-10: qty 2

## 2021-11-10 MED ORDER — KETOROLAC TROMETHAMINE 15 MG/ML IJ SOLN
7.5000 mg | Freq: Four times a day (QID) | INTRAMUSCULAR | Status: AC
  Administered 2021-11-10 – 2021-11-11 (×4): 7.5 mg via INTRAVENOUS
  Filled 2021-11-10 (×3): qty 1

## 2021-11-10 MED ORDER — LEVOTHYROXINE SODIUM 50 MCG PO TABS
50.0000 ug | ORAL_TABLET | Freq: Every day | ORAL | Status: DC
  Administered 2021-11-11 – 2021-11-12 (×2): 50 ug via ORAL
  Filled 2021-11-10 (×2): qty 1

## 2021-11-10 MED ORDER — FENTANYL CITRATE (PF) 100 MCG/2ML IJ SOLN
25.0000 ug | INTRAMUSCULAR | Status: DC | PRN
  Administered 2021-11-10: 25 ug via INTRAVENOUS

## 2021-11-10 MED ORDER — OXYCODONE HCL 5 MG PO TABS
ORAL_TABLET | ORAL | Status: AC
  Filled 2021-11-10: qty 1

## 2021-11-10 MED ORDER — BISACODYL 10 MG RE SUPP: 10.0000 mg | Freq: Every day | RECTAL | Status: DC | PRN

## 2021-11-10 MED ORDER — EPHEDRINE SULFATE (PRESSORS) 50 MG/ML IJ SOLN
INTRAMUSCULAR | Status: DC | PRN
  Administered 2021-11-10: 10 mg via INTRAVENOUS

## 2021-11-10 MED ORDER — MIDAZOLAM HCL 2 MG/2ML IJ SOLN
INTRAMUSCULAR | Status: AC
  Filled 2021-11-10: qty 2

## 2021-11-10 MED ORDER — AMLODIPINE BESYLATE 5 MG PO TABS
5.0000 mg | ORAL_TABLET | Freq: Every day | ORAL | Status: DC
  Administered 2021-11-11 – 2021-11-12 (×2): 5 mg via ORAL
  Filled 2021-11-10 (×2): qty 1

## 2021-11-10 MED ORDER — IPRATROPIUM-ALBUTEROL 0.5-2.5 (3) MG/3ML IN SOLN: 3.0000 mL | Freq: Four times a day (QID) | RESPIRATORY_TRACT | Status: DC | PRN

## 2021-11-10 MED ORDER — FLUTICASONE FUROATE-VILANTEROL 100-25 MCG/ACT IN AEPB
1.0000 | INHALATION_SPRAY | Freq: Every day | RESPIRATORY_TRACT | Status: DC
  Administered 2021-11-11 – 2021-11-12 (×2): 1 via RESPIRATORY_TRACT
  Filled 2021-11-10: qty 28

## 2021-11-10 MED ORDER — OXYCODONE HCL 5 MG PO TABS
5.0000 mg | ORAL_TABLET | ORAL | Status: DC | PRN
  Administered 2021-11-12: 5 mg via ORAL
  Filled 2021-11-10: qty 1

## 2021-11-10 MED ORDER — PROPOFOL 10 MG/ML IV BOLUS
INTRAVENOUS | Status: DC | PRN
  Administered 2021-11-10: 50 mg via INTRAVENOUS
  Administered 2021-11-10: 100 mg via INTRAVENOUS
  Administered 2021-11-10: 50 mg via INTRAVENOUS

## 2021-11-10 MED ORDER — SODIUM CHLORIDE 0.9% FLUSH
3.0000 mL | Freq: Two times a day (BID) | INTRAVENOUS | Status: DC
Start: 2021-11-10 — End: 2021-11-12
  Administered 2021-11-10 – 2021-11-12 (×4): 3 mL via INTRAVENOUS

## 2021-11-10 MED ORDER — SODIUM CHLORIDE 0.9 % IV SOLN: INTRAVENOUS | Status: DC

## 2021-11-10 MED ORDER — HYDROMORPHONE HCL 1 MG/ML IJ SOLN
INTRAMUSCULAR | Status: AC
  Filled 2021-11-10: qty 1

## 2021-11-10 MED ORDER — REMIFENTANIL HCL 1 MG IV SOLR
INTRAVENOUS | Status: AC
  Filled 2021-11-10: qty 1000

## 2021-11-10 MED ORDER — BUPIVACAINE-EPINEPHRINE (PF) 0.5% -1:200000 IJ SOLN
INTRAMUSCULAR | Status: AC
  Filled 2021-11-10: qty 30

## 2021-11-10 MED ORDER — DEXMEDETOMIDINE (PRECEDEX) IN NS 20 MCG/5ML (4 MCG/ML) IV SYRINGE
PREFILLED_SYRINGE | INTRAVENOUS | Status: DC | PRN
  Administered 2021-11-10: 4 ug via INTRAVENOUS
  Administered 2021-11-10 (×2): 8 ug via INTRAVENOUS

## 2021-11-10 MED ORDER — LIDOCAINE HCL (CARDIAC) PF 100 MG/5ML IV SOSY
PREFILLED_SYRINGE | INTRAVENOUS | Status: DC | PRN
  Administered 2021-11-10: 80 mg via INTRAVENOUS

## 2021-11-10 MED ORDER — ONDANSETRON HCL 4 MG/2ML IJ SOLN: 4.0000 mg | Freq: Four times a day (QID) | INTRAMUSCULAR | Status: DC | PRN

## 2021-11-10 MED ORDER — LIDOCAINE HCL (PF) 2 % IJ SOLN
INTRAMUSCULAR | Status: AC
  Filled 2021-11-10: qty 5

## 2021-11-10 MED ORDER — DOCUSATE SODIUM 100 MG PO CAPS
100.0000 mg | ORAL_CAPSULE | Freq: Two times a day (BID) | ORAL | Status: DC
  Administered 2021-11-10 – 2021-11-12 (×3): 100 mg via ORAL
  Filled 2021-11-10 (×4): qty 1

## 2021-11-10 MED ORDER — FLEET ENEMA 7-19 GM/118ML RE ENEM: 1.0000 | ENEMA | Freq: Once | RECTAL | Status: DC | PRN

## 2021-11-10 MED ORDER — ONDANSETRON HCL 4 MG/2ML IJ SOLN
INTRAMUSCULAR | Status: AC
  Filled 2021-11-10: qty 2

## 2021-11-10 MED ORDER — ONDANSETRON HCL 4 MG PO TABS
4.0000 mg | ORAL_TABLET | Freq: Four times a day (QID) | ORAL | Status: DC | PRN
Start: 2021-11-10 — End: 2021-11-12

## 2021-11-10 MED ORDER — KETOROLAC TROMETHAMINE 15 MG/ML IJ SOLN
INTRAMUSCULAR | Status: AC
  Filled 2021-11-10: qty 1

## 2021-11-10 MED ORDER — CHLORHEXIDINE GLUCONATE 0.12 % MT SOLN
15.0000 mL | Freq: Once | OROMUCOSAL | Status: AC
  Administered 2021-11-10: 15 mL via OROMUCOSAL

## 2021-11-10 MED ORDER — CHLORHEXIDINE GLUCONATE 0.12 % MT SOLN
OROMUCOSAL | Status: AC
  Filled 2021-11-10: qty 15

## 2021-11-10 MED ORDER — LORATADINE 10 MG PO TABS
10.0000 mg | ORAL_TABLET | Freq: Every day | ORAL | Status: DC
  Administered 2021-11-11 – 2021-11-12 (×2): 10 mg via ORAL
  Filled 2021-11-10 (×2): qty 1

## 2021-11-10 MED ORDER — MORPHINE SULFATE (PF) 2 MG/ML IV SOLN
1.0000 mg | INTRAVENOUS | Status: DC | PRN
  Administered 2021-11-11: 1 mg via INTRAVENOUS
  Filled 2021-11-10: qty 1

## 2021-11-10 MED ORDER — BUDESONIDE 0.25 MG/2ML IN SUSP
0.2500 mg | Freq: Two times a day (BID) | RESPIRATORY_TRACT | Status: DC
Start: 2021-11-10 — End: 2021-11-11
  Administered 2021-11-10 – 2021-11-11 (×2): 0.25 mg via RESPIRATORY_TRACT
  Filled 2021-11-10 (×2): qty 2

## 2021-11-10 MED ORDER — ENOXAPARIN SODIUM 40 MG/0.4ML IJ SOSY
40.0000 mg | PREFILLED_SYRINGE | INTRAMUSCULAR | Status: DC
  Administered 2021-11-11 – 2021-11-12 (×2): 40 mg via SUBCUTANEOUS
  Filled 2021-11-10 (×2): qty 0.4

## 2021-11-10 MED ORDER — REMIFENTANIL HCL 1 MG IV SOLR
INTRAVENOUS | Status: DC | PRN
  Administered 2021-11-10: .05 ug/kg/min via INTRAVENOUS

## 2021-11-10 MED ORDER — OXYCODONE HCL 5 MG PO TABS
10.0000 mg | ORAL_TABLET | ORAL | Status: DC | PRN
  Administered 2021-11-10 – 2021-11-11 (×3): 10 mg via ORAL
  Filled 2021-11-10 (×4): qty 2

## 2021-11-10 MED ORDER — FENTANYL CITRATE (PF) 100 MCG/2ML IJ SOLN
INTRAMUSCULAR | Status: DC | PRN
  Administered 2021-11-10: 25 ug via INTRAVENOUS
  Administered 2021-11-10 (×2): 50 ug via INTRAVENOUS
  Administered 2021-11-10: 25 ug via INTRAVENOUS
  Administered 2021-11-10: 50 ug via INTRAVENOUS

## 2021-11-10 MED ORDER — SURGIFLO WITH THROMBIN (HEMOSTATIC MATRIX KIT) OPTIME
TOPICAL | Status: DC | PRN
  Administered 2021-11-10 (×2): 1 via TOPICAL

## 2021-11-10 MED ORDER — GABAPENTIN 100 MG PO CAPS
100.0000 mg | ORAL_CAPSULE | Freq: Every day | ORAL | Status: DC
  Administered 2021-11-11 – 2021-11-12 (×2): 100 mg via ORAL
  Filled 2021-11-10 (×2): qty 1

## 2021-11-10 MED ORDER — ACETAMINOPHEN 10 MG/ML IV SOLN
INTRAVENOUS | Status: AC
Start: 2021-11-10 — End: ?
  Filled 2021-11-10: qty 100

## 2021-11-10 MED ORDER — FAMOTIDINE 20 MG PO TABS
20.0000 mg | ORAL_TABLET | Freq: Every day | ORAL | Status: DC
  Administered 2021-11-10 – 2021-11-11 (×2): 20 mg via ORAL
  Filled 2021-11-10 (×2): qty 1

## 2021-11-10 MED ORDER — PHENOL 1.4 % MT LIQD: 1.0000 | OROMUCOSAL | Status: DC | PRN

## 2021-11-10 MED ORDER — SODIUM CHLORIDE 0.9 % IV SOLN
250.0000 mL | INTRAVENOUS | Status: DC
  Administered 2021-11-10: 250 mL via INTRAVENOUS

## 2021-11-10 MED ORDER — HYDROMORPHONE HCL 1 MG/ML IJ SOLN
INTRAMUSCULAR | Status: DC | PRN
  Administered 2021-11-10: .2 mg via INTRAVENOUS
  Administered 2021-11-10: .5 mg via INTRAVENOUS
  Administered 2021-11-10: .3 mg via INTRAVENOUS

## 2021-11-10 MED ORDER — EPHEDRINE 5 MG/ML INJ
INTRAVENOUS | Status: AC
  Filled 2021-11-10: qty 5

## 2021-11-10 SURGICAL SUPPLY — 64 items
ALLOGRAFT BONE FIBER KORE 5 (Bone Implant) IMPLANT
BASIN KIT SINGLE STR (MISCELLANEOUS) ×1 IMPLANT
BULB RESERV EVAC DRAIN JP 100C (MISCELLANEOUS) ×1 IMPLANT
BUR NEURO DRILL SOFT 3.0X3.8M (BURR) ×2 IMPLANT
CHLORAPREP W/TINT 26 (MISCELLANEOUS) ×2 IMPLANT
COUNTER NEEDLE 20/40 LG (NEEDLE) ×1 IMPLANT
DERMABOND ADVANCED (GAUZE/BANDAGES/DRESSINGS) ×1
DERMABOND ADVANCED .7 DNX12 (GAUZE/BANDAGES/DRESSINGS) ×1 IMPLANT
DRAIN CHANNEL JP 10F RND 20C F (MISCELLANEOUS) ×1 IMPLANT
DRAPE C ARM PK CFD 31 SPINE (DRAPES) ×2 IMPLANT
DRAPE LAPAROTOMY 77X122 PED (DRAPES) ×2 IMPLANT
DRAPE MICROSCOPE SPINE 48X150 (DRAPES) ×2 IMPLANT
DRAPE SURG 17X11 SM STRL (DRAPES) ×2 IMPLANT
ELECT CAUTERY BLADE TIP 2.5 (TIP)
ELECT REM PT RETURN 9FT ADLT (ELECTROSURGICAL) ×2
ELECTRODE CAUTERY BLDE TIP 2.5 (TIP) ×1 IMPLANT
ELECTRODE REM PT RTRN 9FT ADLT (ELECTROSURGICAL) ×1 IMPLANT
FEE INTRAOP CADWELL SUPPLY NCS (MISCELLANEOUS) IMPLANT
FEE INTRAOP MONITOR IMPULS NCS (MISCELLANEOUS) IMPLANT
GLOVE BIOGEL PI IND STRL 6.5 (GLOVE) ×1 IMPLANT
GLOVE BIOGEL PI IND STRL 8.5 (GLOVE) ×1 IMPLANT
GLOVE BIOGEL PI INDICATOR 6.5 (GLOVE)
GLOVE BIOGEL PI INDICATOR 8.5 (GLOVE) ×1
GLOVE SURG SYN 6.5 ES PF (GLOVE) ×4 IMPLANT
GLOVE SURG SYN 6.5 PF PI (GLOVE) ×2 IMPLANT
GLOVE SURG SYN 8.5  E (GLOVE) ×6
GLOVE SURG SYN 8.5 E (GLOVE) ×3 IMPLANT
GLOVE SURG SYN 8.5 PF PI (GLOVE) ×3 IMPLANT
GOWN SRG LRG LVL 4 IMPRV REINF (GOWNS) ×2 IMPLANT
GOWN SRG XL LVL 3 NONREINFORCE (GOWNS) ×1 IMPLANT
GOWN STRL NON-REIN TWL XL LVL3 (GOWNS) ×2
GOWN STRL REIN LRG LVL4 (GOWNS) ×4
GRADUATE 1200CC STRL 31836 (MISCELLANEOUS) ×1 IMPLANT
INTRAOP CADWELL SUPPLY FEE NCS (MISCELLANEOUS) ×1
INTRAOP DISP SUPPLY FEE NCS (MISCELLANEOUS) ×2
INTRAOP MONITOR FEE IMPULS NCS (MISCELLANEOUS) ×1
INTRAOP MONITOR FEE IMPULSE (MISCELLANEOUS) ×2
KIT TURNOVER KIT A (KITS) ×2 IMPLANT
MANIFOLD NEPTUNE II (INSTRUMENTS) ×2 IMPLANT
MARKER SKIN DUAL TIP RULER LAB (MISCELLANEOUS) ×3 IMPLANT
NS IRRIG 1000ML POUR BTL (IV SOLUTION) ×1 IMPLANT
NS IRRIG 500ML POUR BTL (IV SOLUTION) ×1 IMPLANT
PACK LAMINECTOMY NEURO (CUSTOM PROCEDURE TRAY) ×2 IMPLANT
PAD ARMBOARD 7.5X6 YLW CONV (MISCELLANEOUS) ×4 IMPLANT
PENCIL ELECTRO HAND CTR (MISCELLANEOUS) ×2 IMPLANT
PIN CASPAR 14 (PIN) ×1 IMPLANT
PIN CASPAR 14MM (PIN) ×2
PLATE ANT CERV XTEND 4 LV 69 (Plate) ×1 IMPLANT
SCREW VAR 4.2 XD SELF DRILL 16 (Screw) ×10 IMPLANT
SCREW XTEND SELF DRILL 4.6X16 (Screw) ×1 IMPLANT
SOLUTION IRRIG SURGIPHOR (IV SOLUTION) ×1 IMPLANT
SPACER CERVICAL FRGE 12X14X7-7 (Spacer) ×3 IMPLANT
SPACER CERVICAL FRGE 12X14X8-7 (Spacer) ×1 IMPLANT
SPONGE KITTNER 5P (MISCELLANEOUS) ×4 IMPLANT
SURGIFLO W/THROMBIN 8M KIT (HEMOSTASIS) ×4 IMPLANT
SUT SILK 2 0 (SUTURE)
SUT SILK 2-0 18XBRD TIE 12 (SUTURE) IMPLANT
SUT V-LOC 90 ABS DVC 3-0 CL (SUTURE) ×2 IMPLANT
SUT VIC AB 3-0 SH 8-18 (SUTURE) ×2 IMPLANT
SYR 30ML LL (SYRINGE) ×2 IMPLANT
TAPE CLOTH 3X10 WHT NS LF (GAUZE/BANDAGES/DRESSINGS) ×6 IMPLANT
TOWEL OR 17X26 4PK STRL BLUE (TOWEL DISPOSABLE) ×5 IMPLANT
TRAY FOLEY MTR SLVR 16FR STAT (SET/KITS/TRAYS/PACK) ×1 IMPLANT
TUBING CONNECTING 10 (TUBING) ×1 IMPLANT

## 2021-11-10 NOTE — Anesthesia Preprocedure Evaluation (Signed)
Anesthesia Evaluation  Patient identified by MRN, date of birth, ID band Patient awake    Reviewed: Allergy & Precautions, NPO status , Patient's Chart, lab work & pertinent test results  History of Anesthesia Complications Negative for: history of anesthetic complications  Airway Mallampati: III  TM Distance: <3 FB Neck ROM: full    Dental  (+) Chipped   Pulmonary neg shortness of breath, asthma ,    Pulmonary exam normal        Cardiovascular Exercise Tolerance: Good hypertension, (-) angina(-) Past MI Normal cardiovascular exam     Neuro/Psych PSYCHIATRIC DISORDERS negative neurological ROS     GI/Hepatic Neg liver ROS, GERD  Controlled,  Endo/Other  Hypothyroidism   Renal/GU      Musculoskeletal   Abdominal   Peds  Hematology negative hematology ROS (+)   Anesthesia Other Findings Past Medical History: No date: Arthritis No date: Asthma No date: Depression No date: Dyspnea     Comment:  with heat No date: Environmental and seasonal allergies No date: GERD (gastroesophageal reflux disease) No date: Hypertension No date: Hypothyroidism No date: Presence of dental prosthetic device     Comment:  Dental implants No date: Wheezing  Past Surgical History: 04/26/1987: ABDOMINAL HYSTERECTOMY 02/07/2020: BROW LIFT; Bilateral     Comment:  Procedure: BLEPHAROPLASTY UPPER EYELID; W/EXCESS SKIN               BROW PTOSIS REPAIR BILATERAL;  Surgeon: Karle Starch,               MD;  Location: [place] [facility];  Service:               Ophthalmology;  Laterality: Bilateral; 09/13/2016: CATARACT EXTRACTION W/PHACO; Right     Comment:  Procedure: CATARACT EXTRACTION PHACO AND INTRAOCULAR               LENS PLACEMENT ([place]);  Surgeon: Birder Robson, MD;                Location: [facility];  Service: Ophthalmology;  Laterality:              Right;  [place] 00:38 AP% 14.3 CDE 5.51 Fluid pack lot #                [id number] 10/11/2016: CATARACT EXTRACTION W/PHACO; Left     Comment:  Procedure: CATARACT EXTRACTION PHACO AND INTRAOCULAR               LENS PLACEMENT ([facility]);  Surgeon: Birder Robson, MD;                Location: [facility];  Service: Ophthalmology;  Laterality:              Left;  [place] 00:30 AP% 11.3 CDE 3.50 fluid pack lot #               [id number] 09/30/2014: COLONOSCOPY WITH PROPOFOL; N/A     Comment:  Procedure: COLONOSCOPY WITH PROPOFOL;  Surgeon: Robert Bellow, MD;  Location: [facility] ENDOSCOPY;  Service:               Endoscopy;  Laterality: N/A; 03/24/2021: COLONOSCOPY WITH PROPOFOL; N/A     Comment:  Procedure: COLONOSCOPY WITH PROPOFOL;  Surgeon: Lin Landsman, MD;  Location: [facility] ENDOSCOPY;  Service:  Gastroenterology;  Laterality: N/A; 09/30/2014: ESOPHAGOGASTRODUODENOSCOPY; N/A     Comment:  Procedure: ESOPHAGOGASTRODUODENOSCOPY (EGD);  Surgeon:               Robert Bellow, MD;  Location: [facility] ENDOSCOPY;                Service: Endoscopy;  Laterality: N/A; 03/24/2021: ESOPHAGOGASTRODUODENOSCOPY; N/A     Comment:  Procedure: ESOPHAGOGASTRODUODENOSCOPY (EGD);  Surgeon:               Lin Landsman, MD;  Location: [facility] ENDOSCOPY;                Service: Gastroenterology;  Laterality: N/A; 04/26/1983: FOOT SURGERY; Bilateral 04/25/2014: KNEE SURGERY  BMI    Body Mass Index: 30.89 kg/m      Reproductive/Obstetrics negative OB ROS                             Anesthesia Physical Anesthesia Plan  ASA: 3  Anesthesia Plan: General ETT   Post-op Pain Management:    Induction: Intravenous  PONV Risk Score and Plan: Ondansetron, Dexamethasone, Midazolam and Treatment may vary due to age or medical condition  Airway Management Planned: Oral ETT  Additional Equipment:   Intra-op Plan:   Post-operative Plan: Extubation in OR  Informed Consent: I have reviewed the patients History and  Physical, chart, labs and discussed the procedure including the risks, benefits and alternatives for the proposed anesthesia with the patient or authorized representative who has indicated his/her understanding and acceptance.     Dental Advisory Given  Plan Discussed with: Anesthesiologist, CRNA and Surgeon  Anesthesia Plan Comments: (Patient consented for risks of anesthesia including but not limited to:  - adverse reactions to medications - damage to eyes, teeth, lips or other oral mucosa - nerve damage due to positioning  - sore throat or hoarseness - Damage to heart, brain, nerves, lungs, other parts of body or loss of life  Patient voiced understanding.)        Anesthesia Quick Evaluation

## 2021-11-10 NOTE — Progress Notes (Signed)
PHARMACY NOTE:  ANTIMICROBIAL DOSAGE for surgical prophylaxis  As per  protocol was adapted from the recently published consensus guidelines from the [place] [employer] (ASHP), Society for Healthcare Epidemiology of [place] [phone]), Infectious  Disease Society of [place] (IDSA), and the Surgical Infection Society (SIS)  Patient weight: 79.1 kg  antimicrobial dosage:  cefazolin 2 grams IV x 1  Indication: surgical prophylaxis   Thank you for allowing pharmacy to be a part of this patient's care.  Dallie Piles, [facility] 11/10/2021 11:08 AM

## 2021-11-10 NOTE — Anesthesia Procedure Notes (Signed)
Procedure Name: Intubation Date/Time: 11/10/2021 1:06 PM  Performed by: Aline Brochure, CRNAPre-anesthesia Checklist: Patient identified, Patient being monitored, Timeout performed, Emergency Drugs available and Suction available Patient Re-evaluated:Patient Re-evaluated prior to induction Oxygen Delivery Method: Circle system utilized Preoxygenation: Pre-oxygenation with 100% oxygen Induction Type: IV induction Ventilation: Mask ventilation without difficulty Laryngoscope Size: 3 and McGraph Grade View: Grade I Tube type: Oral Tube size: 7.0 mm Number of attempts: 1 Airway Equipment and Method: Stylet and Video-laryngoscopy Placement Confirmation: ETT inserted through vocal cords under direct vision, positive ETCO2 and breath sounds checked- equal and bilateral Secured at: 20 cm Tube secured with: Tape Dental Injury: Teeth and Oropharynx as per pre-operative assessment

## 2021-11-10 NOTE — Op Note (Addendum)
Indications: Ms. 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  The patient failed conservative management and elected for surgical intervention.  Findings: extensive spondylosis  Preoperative Diagnosis: Cervical myelopathy G95.9, Degenerative cervical spinal stenosis M48.02, Lhermitte's sign positive R29.818 Postoperative Diagnosis: same   EBL: 200 ml IVF: see AR ml Drains: 1 placed Disposition: Extubated and Stable to PACU Complications: none  A foley catheter was placed.   Preoperative Note:   Risks of surgery discussed include: infection, bleeding, stroke, coma, death, paralysis, CSF leak, nerve/spinal cord injury, numbness, tingling, weakness, complex regional pain syndrome, recurrent stenosis and/or disc herniation, vascular injury, development of instability, neck/back pain, need for further surgery, persistent symptoms, development of deformity, and the risks of anesthesia. The patient understood these risks and agreed to proceed.     Operative Procedure: 1. Anterior Cervical Discectomy and Fusion C3-4 including bilateral foraminotomies and end plate preparation  2. Anterior Cervical Discectomy and Fusion C4-5 including bilateral foraminotomies and end plate preparation  3. Anterior Cervical Discectomy and Fusion C5-6 including bilateral foraminotomies and end plate preparation  4. Anterior Cervical Discectomy and Fusion C6-7 including bilateral foraminotomies and end plate preparation 5. Anterior Spinal Instrumentation C3 to 7 using Globus Xtend 6. Anterior arthrodesis from C3 to C7 7. Use of the operative microscope 8. Use of intraoperative flouroscopy  PROCEDURE IN DETAIL: After obtaining informed consent, the patient taken to the operating room, placed in supine position, general anesthesia induced.  The patient had a small shoulder roll placed behind their shoulders.  The patient received preop antibiotics and IV  Decadron.  The patient had a neck incision outlined, was prepped and draped in usual sterile fashion. The incision was injected with local anesthetic.   An incision was opened, dissection taken down medial to the carotid artery and jugular vein, lateral to the trachea and esophagus.  The prevertebral fascia identified and a localizing x-ray demonstrated the correct level.  The longus colli were dissected laterally, and self-retaining retractors placed to open the operative field. The microscope was then brought into the field.  With this complete, distractor pins were placed in the vertebral bodies of C3 and C5.  The distractor was placed from C3 to C5. The annuli at C3-4 and C4-5 were opened with a bovie. Curettes and pituitary rongeurs used to remove the majority of disk at each level, then the drill was used to remove the posterior osteophyte and begin the foraminotomies. The nerve hook was used to elevate the posterior longitudinal ligament, which was then removed with Kerrison rongeurs. The microblunt nerve hook could be passed out the foramen bilaterally at each level.   Meticulous hemostasis was obtained. After hemostasis, structural allograft was tapped behind the anterior lip of the vertebral body at C3-4 (7 mm height) and C4-5 (6 mm height).     The distractor was then removed, and the C3 caspar pin removed. Bone wax was used for hemostasis. An additional Caspar pin was placed at C7. The distractor was placed from C5-7, and the annuli at C5-6 and C6-7 were opened using a bovie.  Curettes and pituitary rongeurs used to remove the majority of disk, then the drill was used to remove the posterior osteophyte and begin the foraminotomies. The nerve hook was used to elevate the posterior longitudinal ligament, which was then removed with Kerrison rongeurs. The microblunt nerve hook could be passed out the foramen bilaterally at each level.   Meticulous hemostasis was obtained.  Structural  allograft was  tapped behind the anterior lip of the vertebral body at C5-6 (7 mm height) and C6-7 (8 mm height).  The caspar distractor was removed, and bone wax used for hemostasis at each level. The anterior osteophytes were removed.    A separate, five segment, four level plate (69 mm Globus Xtend) was chosen.  Two screws placed in the vertebral bodies of all five segments, respectively making sure the screws were behind the locking mechanism.  Final AP and lateral radiographs were taken.   With everything in good position, the wound was irrigated copiously with sterile solution and meticulous hemostasis obtained.  A drain was placed. Wound was closed in 2 layers using interrupted inverted 3-0 Vicryl sutures in the platysma and 3-0 monocryl in the dermis.  The wound was dressed with dermabond, the head of bed at 30 degrees, taken to recovery room in stable condition.  No new postop neurological deficits were identified.  All counts were correct at the end of the case.   Monitoring was used throughout without any changes.   I performed the entire procedure with Cooper Render PA as an Environmental consultant. An assistant was required for this procedure due to the complexity.  The assistant provided assistance in tissue manipulation and suction, and was required for the successful and safe performance of the procedure. I performed the critical portions of the procedure.   Meade Maw MD

## 2021-11-10 NOTE — Discharge Instructions (Signed)
Your surgeon has performed an operation on your cervical spine (neck) to relieve pressure on the spinal cord and/or nerves. This involved making an incision in the front of your neck and removing one or more of the discs that support your spine. Next, a small piece of bone, a titanium plate, and screws were used to fuse two or more of the vertebrae (bones) together.  The following are instructions to help in your recovery once you have been discharged from the hospital. Even if you feel well, it is important that you follow these activity guidelines. If you do not let your neck heal properly from the surgery, you can increase the chance of return of your symptoms and other complications.  * Do not take anti-inflammatory medications for 3 months after surgery (naproxen [Aleve], ibuprofen [Advil, Motrin], etc.). These medications can prevent your bones from healing properly.  Activity    No bending, lifting, or twisting ("BLT"). Avoid lifting objects heavier than 10 pounds (gallon milk jug).  Where possible, avoid household activities that involve lifting, bending, reaching, pushing, or pulling such as laundry, vacuuming, grocery shopping, and childcare. Try to arrange for help from friends and family for these activities while your back heals.  Increase physical activity slowly as tolerated.  Taking short walks is encouraged, but avoid strenuous exercise. Do not jog, run, bicycle, lift weights, or participate in any other exercises unless specifically allowed by your doctor.  Talk to your doctor before resuming sexual activity.  You should not drive until cleared by your doctor.  Until released by your doctor, you should not return to work or school.  You should rest at home and let your body heal.   You may shower three days after your surgery.  After showering, lightly dab your incision dry. Do not take a tub bath or go swimming until approved by your doctor at your follow-up appointment.  If  your doctor ordered a cervical collar (neck brace) for you, you should wear it whenever you are out of bed. You may remove it when lying down or sleeping, but you should wear it at all other times. Not all neck surgeries require a cervical collar.  If you smoke, we strongly recommend that you quit.  Smoking has been proven to interfere with normal bone healing and will dramatically reduce the success rate of your surgery. Please contact [facility] (800-QUIT-NOW) and use the resources at www.[facility].com for assistance in stopping smoking.  Surgical Incision   If you have a dressing on your incision, you may remove it two days after your surgery. Keep your incision area clean and dry. Your incision was closed with Dermabond glue. The glue should begin to peel away within about a week. Diet           You may return to your usual diet. However, you may experience discomfort when swallowing in the first month after your surgery. This is normal. You may find that softer foods are more comfortable for you to swallow. Be sure to stay hydrated.  When to Contact us  You may experience pain in your neck and/or pain between your shoulder blades. This is normal and should improve in the next few weeks with the help of pain medication, muscle relaxers, and rest. Some patients report that a warm compress on the back of the neck or between the shoulder blades helps.  However, should you experience any of the following, contact us immediately: New numbness or weakness Pain that is progressively getting  worse, and is not relieved by your pain medication, muscle relaxers, rest, and warm compresses Bleeding, redness, swelling, pain, or drainage from surgical incision Chills or flu-like symptoms Fever greater than 101.0 F (38.3 C) Inability to eat, drink fluids, or take medications Problems with bowel or bladder functions Difficulty breathing or shortness of breath Warmth, tenderness, or swelling in your  calf Contact Information During office hours (Monday-Friday 9 am to 5 pm), please call your physician at [phone] and ask for Berdine Addison After hours and weekends, please call [phone] and speak with the answering service, who will contact the doctor on call.  If that fails, call the Chunky Operator at [phone] and ask for the Neurosurgery Resident On Call  For a life-threatening emergency, call 911

## 2021-11-10 NOTE — H&P (Signed)
I have reviewed and confirmed my history and physical from 10/21/21 with no additions or changes. Plan for C3-7 ACDF.  Risks and benefits reviewed.  Heart sounds normal no MRG. Chest Clear to Auscultation Bilaterally.

## 2021-11-10 NOTE — Transfer of Care (Signed)
Immediate Anesthesia Transfer of Care Note  Patient: Sarah [name]  Procedure(s) Performed: C3-7 ANTERIOR CERVICAL DISCECTOMY AND FUSION (GLOBUS FORGE) (Spine Cervical)  Patient Location: PACU  Anesthesia Type:General  Level of Consciousness: drowsy  Airway & Oxygen Therapy: Patient Spontanous Breathing and Patient connected to face mask oxygen  Post-op Assessment: Report given to RN and Post -op Vital signs reviewed and stable  Post vital signs: Reviewed and stable  Last Vitals:  Vitals Value Taken Time  BP 163/80 11/10/21 1653  Temp    Pulse 107 11/10/21 1654  Resp 17 11/10/21 1654  SpO2 97 % 11/10/21 1654  Vitals shown include unvalidated device data.  Last Pain:  Vitals:   11/10/21 1103  TempSrc: Temporal  PainSc: 0-No pain         Complications: No notable events documented.

## 2021-11-10 NOTE — Progress Notes (Signed)
Received report from previous RN. Patient is awake/alert, c/o's anterior neck discomfort, chronic vs surgery today. Medicated as ordered, reviewed procedure with patient and plan of care, verbalizes understanding.  Able to move upper and lower ext, slight bend of knee's. Hand grasps present weak-moderate. Will continue to monitor closely, family updated

## 2021-11-11 ENCOUNTER — Encounter: Payer: Self-pay | Admitting: Neurosurgery

## 2021-11-11 MED ORDER — BUDESONIDE 0.25 MG/2ML IN SUSP
0.2500 mg | Freq: Two times a day (BID) | RESPIRATORY_TRACT | Status: DC
Start: 2021-11-11 — End: 2021-11-12
  Administered 2021-11-11 – 2021-11-12 (×2): 0.25 mg via RESPIRATORY_TRACT
  Filled 2021-11-11 (×2): qty 2

## 2021-11-11 NOTE — Evaluation (Signed)
Physical Therapy Evaluation Patient Details Name: Sarah [name] MRN: [id number] DOB: 1950-06-09 Today's Date: 11/11/2021  History of Present Illness  Ms. 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 S/P ACDF C 3-7.  Clinical Impression  Patient pleasant and agreeable to PT assessment. She is supervision for transfers. Ambulated 200 feet with RW min guard and went up/down 4 steps with B rails and supervision. She did have one lob while in bathroom and not holding on trying to throw paper away. Patient will continue to benefit from skilled PT to improve functional independence and safety with mobility.        Recommendations for follow up therapy are one component of a multi-disciplinary discharge planning process, led by the attending physician.  Recommendations may be updated based on patient status, additional functional criteria and insurance authorization.  Follow Up Recommendations Home health PT      Assistance Recommended at Discharge Frequent or constant Supervision/Assistance  Patient can return home with the following  A little help with walking and/or transfers;A little help with bathing/dressing/bathroom;Assistance with cooking/housework;Assist for transportation;Help with stairs or ramp for entrance    Equipment Recommendations None recommended by PT  Recommendations for Other Services       Functional Status Assessment Patient has had a recent decline in their functional status and demonstrates the ability to make significant improvements in function in a reasonable and predictable amount of time.     Precautions / Restrictions Precautions Precautions: Fall Restrictions Weight Bearing Restrictions: No      Mobility  Bed Mobility Overal bed mobility: Independent                  Transfers Overall transfer level: Modified independent Equipment used: Rolling walker (2 wheels)                     Ambulation/Gait Ambulation/Gait assistance: Min guard Gait Distance (Feet): 200 Feet Assistive device: Rolling walker (2 wheels) Gait Pattern/deviations: Step-through pattern Gait velocity: WFL     General Gait Details: patient is unsteady without B UE support. Had lob in bathroom when not holding on attempting to open trash can with one foot. With RW she is safe with mobility  Stairs Stairs: Yes Stairs assistance: Supervision Stair Management: Two rails, Alternating pattern, Forwards Number of Stairs: 4    Wheelchair Mobility    Modified Rankin (Stroke Patients Only)       Balance Overall balance assessment: Needs assistance Sitting-balance support: Feet supported Sitting balance-Leahy Scale: Normal     Standing balance support: Bilateral upper extremity supported, During functional activity, Reliant on assistive device for balance Standing balance-Leahy Scale: Fair                               Pertinent Vitals/Pain Pain Assessment Pain Assessment: No/denies pain    Home Living Family/patient expects to be discharged to:: Private residence Living Arrangements: Non-relatives/Friends Available Help at Discharge: Friend(s);Available 24 hours/day Type of Home: House Home Access: Stairs to enter Entrance Stairs-Rails: Chemical engineer of Steps: 5   Home Layout: One level Home Equipment: Conservation officer, nature (2 wheels);Cane - single point;BSC/3in1      Prior Function Prior Level of Function : Independent/Modified Independent                     Hand Dominance        Extremity/Trunk  Assessment   Upper Extremity Assessment Upper Extremity Assessment: Defer to OT evaluation    Lower Extremity Assessment Lower Extremity Assessment: Overall WFL for tasks assessed    Cervical / Trunk Assessment Cervical / Trunk Assessment: Neck Surgery  Communication   Communication: No difficulties  Cognition Arousal/Alertness:  Awake/alert Behavior During Therapy: WFL for tasks assessed/performed Overall Cognitive Status: Within Functional Limits for tasks assessed                                          General Comments      Exercises     Assessment/Plan    PT Assessment Patient needs continued PT services  PT Problem List Decreased strength;Decreased mobility;Decreased safety awareness;Decreased balance;Decreased knowledge of use of DME;Decreased knowledge of precautions;Decreased range of motion       PT Treatment Interventions DME instruction;Therapeutic exercise;Gait training;Balance training;Stair training;Functional mobility training;Therapeutic activities;Patient/family education    PT Goals (Current goals can be found in the Care Plan section)  Acute Rehab PT Goals Patient Stated Goal: to go home with friend at discharge PT Goal Formulation: With patient Time For Goal Achievement: 11/18/21 Potential to Achieve Goals: Good    Frequency 7X/week     Co-evaluation               AM-PAC PT "6 Clicks" Mobility  Outcome Measure Help needed turning from your back to your side while in a flat bed without using bedrails?: A Little Help needed moving from lying on your back to sitting on the side of a flat bed without using bedrails?: A Little Help needed moving to and from a bed to a chair (including a wheelchair)?: A Little Help needed standing up from a chair using your arms (e.g., wheelchair or bedside chair)?: A Little Help needed to walk in hospital room?: A Little Help needed climbing 3-5 steps with a railing? : A Little 6 Click Score: 18    End of Session Equipment Utilized During Treatment: Gait belt;Cervical collar Activity Tolerance: Patient tolerated treatment well Patient left: in bed;with call bell/phone within reach;with bed alarm set Nurse Communication: Mobility status PT Visit Diagnosis: Unsteadiness on feet (R26.81);Difficulty in walking, not elsewhere  classified (R26.2);Repeated falls (R29.6)    Time: 1030-1047 PT Time Calculation (min) (ACUTE ONLY): 17 min   Charges:   PT Evaluation $PT Eval Moderate Complexity: 1 Mod          Kentrell Guettler, PT, GCS 11/11/21,11:11 AM

## 2021-11-11 NOTE — Progress Notes (Signed)
Patient is set up with [facility] for [facility] She has a rolling walker at home She will have transportation with her friend

## 2021-11-11 NOTE — Progress Notes (Signed)
    Attending Progress Note  History: Sarah [name] is s/p C3-7 ACDF  POD1 NAEO.  Physical Exam: Vitals:   11/10/21 2027 11/11/21 0520  BP:  (!) 143/68  Pulse:  97  Resp:  18  Temp:  97.6 F (36.4 C)  SpO2: 92% 94%    AA Ox3 Voice is hoarse  CNI  Strength:5/5 throughout except 4+ right triceps JP output 55 since surgery.  Data:  No results for input(s): "NA", "K", "CL", "CO2", "BUN", "CREATININE", "LABGLOM", "GLUCOSE", "CALCIUM" in the last 168 hours. No results for input(s): "AST", "ALT", "ALKPHOS" in the last 168 hours.  Invalid input(s): "TBILI"   No results for input(s): "WBC", "HGB", "HCT", "PLT" in the last 168 hours. No results for input(s): "APTT", "INR" in the last 168 hours.       Other tests/results: none   Assessment/Plan:  Sarah [name] is a 71 y.o s/p ACDF for cervical myelopathy   - mobilize - pain control - DVT prophylaxis - PTOT - will leave JP drain in place this morning and reevaluate this afternoon - Pt has cervical collar in place. Can be removed for eating and sleeping.   Cooper Render PA-C Department of Neurosurgery

## 2021-11-11 NOTE — Evaluation (Signed)
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 S/P ACDF C 3-7.   Clinical Impression   Patient presenting with decreased Ind in self care, balance, functional mobility/transfers, endurance, and safety awareness. Patient reports being living at home alone and being independent in all aspects of care at baseline. Pt plans on going to stay with her friend, Sarah [name], at discharge.Patient currently functioning at min guard- min A for functional mobility and self care tasks without use of AD. Pt would benefit from RW for support and safety at discharge. OT educated pt on cervical precautions, when to wear hard collar, and how to increased Ind with self care tasks.Patient will benefit from acute OT to increase overall independence in the areas of ADLs, functional mobility, and safety awareness in order to safely discharge home with family.      Recommendations for follow up therapy are one component of a multi-disciplinary discharge planning process, led by the attending physician.  Recommendations may be updated based on patient status, additional functional criteria and insurance authorization.   Follow Up Recommendations  Home health OT    Assistance Recommended at Discharge Intermittent Supervision/Assistance  Patient can return home with the following A little help with walking and/or transfers;A little help with bathing/dressing/bathroom;Help with stairs or ramp for entrance;Assist for transportation;Assistance with cooking/housework    Functional Status Assessment  Patient has had a recent decline in their functional status and demonstrates the ability to make significant improvements in function in a reasonable and predictable amount of time.  Equipment Recommendations  None  recommended by OT       Precautions / Restrictions Precautions Precautions: Fall Restrictions Weight Bearing Restrictions: No      Mobility Bed Mobility Overal bed mobility: Independent                  Transfers Overall transfer level: Needs assistance Equipment used: 1 person hand held assist Transfers: Sit to/from Stand, Bed to chair/wheelchair/BSC Sit to Stand: Supervision     Step pivot transfers: Min guard            Balance Overall balance assessment: Needs assistance Sitting-balance support: Feet supported Sitting balance-Leahy Scale: Normal     Standing balance support: Bilateral upper extremity supported, During functional activity, Reliant on assistive device for balance Standing balance-Leahy Scale: Fair                             ADL either performed or assessed with clinical judgement   ADL Overall ADL's : Needs assistance/impaired     Grooming: Wash/dry hands;Wash/dry face;Standing;Min guard                   Armed forces technical officer: Ambulation;Minimal assistance;Min guard   Writer and Hygiene: Min guard;Sit to/from stand       Functional mobility during ADLs: Min guard;Minimal assistance       Vision Patient Visual Report: No change from baseline              Pertinent Vitals/Pain Pain Assessment Pain Assessment: No/denies pain     Hand Dominance Right   Extremity/Trunk Assessment Upper Extremity Assessment Upper Extremity Assessment: Overall WFL for tasks assessed   Lower Extremity Assessment Lower Extremity Assessment: Overall WFL for tasks assessed   Cervical /  Trunk Assessment Cervical / Trunk Assessment: Neck Surgery   Communication Communication Communication: No difficulties   Cognition Arousal/Alertness: Awake/alert Behavior During Therapy: WFL for tasks assessed/performed Overall Cognitive Status: Within Functional Limits for tasks assessed                                                   Home Living Family/patient expects to be discharged to:: Private residence Living Arrangements: Non-relatives/Friends Available Help at Discharge: Friend(s);Available 24 hours/day Type of Home: House Home Access: Stairs to enter [employer] of Steps: 5 Entrance Stairs-Rails: Left;Right;Can reach both Home Layout: One level     Bathroom Shower/Tub: Tub/shower unit         Home Equipment: Conservation officer, nature (2 wheels);Cane - single point;BSC/3in1;Shower seat          Prior Functioning/Environment Prior Level of Function : Independent/Modified Independent                        OT Problem List: Decreased activity tolerance;Decreased safety awareness;Impaired balance (sitting and/or standing);Decreased knowledge of use of DME or AE      OT Treatment/Interventions: Self-care/ADL training;Therapeutic exercise;Therapeutic activities;Energy conservation;Manual therapy;Balance training;Patient/family education;DME and/or AE instruction    OT Goals(Current goals can be found in the care plan section) Acute Rehab OT Goals Patient Stated Goal: to go home OT Goal Formulation: With patient Time For Goal Achievement: 11/25/21 Potential to Achieve Goals: Good ADL Goals Pt Will Perform Grooming: with modified independence;standing Pt Will Perform Lower Body Dressing: with modified independence;sit to/from stand Pt Will Transfer to Toilet: with modified independence;ambulating Pt Will Perform Toileting - Clothing Manipulation and hygiene: with modified independence;sit to/from stand  OT Frequency: Min 2X/week       AM-PAC OT "6 Clicks" Daily Activity     Outcome Measure Help from another person eating meals?: None Help from another person taking care of personal grooming?: A Little Help from another person toileting, which includes using toliet, bedpan, or urinal?: A Little Help from another person bathing (including  washing, rinsing, drying)?: A Little Help from another person to put on and taking off regular upper body clothing?: A Little Help from another person to put on and taking off regular lower body clothing?: A Little 6 Click Score: 19   End of Session Equipment Utilized During Treatment: Cervical collar Nurse Communication: Mobility status;Other (comment) (on RA)  Activity Tolerance: Patient tolerated treatment well Patient left: in chair;with chair alarm set;with call bell/phone within reach  OT Visit Diagnosis: Unsteadiness on feet (R26.81);Repeated falls (R29.6);Muscle weakness (generalized) (M62.81)                Time: 5003-7048 OT Time Calculation (min): 21 min Charges:  OT General Charges $OT Visit: 1 Visit OT Evaluation $OT Eval Moderate Complexity: 1 Mod OT Treatments $Self Care/Home Management : 8-22 mins  Darleen Crocker, MS, OTR/L , CBIS ascom [phone]  11/11/21, 12:56 PM

## 2021-11-11 NOTE — Anesthesia Postprocedure Evaluation (Signed)
Anesthesia Post Note  Patient: [name]  Procedure(s) Performed: C3-7 ANTERIOR CERVICAL DISCECTOMY AND FUSION (GLOBUS FORGE) (Spine Cervical)  Patient location during evaluation: PACU Anesthesia Type: General Level of consciousness: awake and alert Pain management: pain level controlled Vital Signs Assessment: post-procedure vital signs reviewed and stable Respiratory status: spontaneous breathing, nonlabored ventilation, respiratory function stable and patient connected to nasal cannula oxygen Cardiovascular status: blood pressure returned to baseline and stable Postop Assessment: no apparent nausea or vomiting Anesthetic complications: no   No notable events documented.   Last Vitals:  Vitals:   11/10/21 2027 11/11/21 0520  BP:  (!) 143/68  Pulse:  97  Resp:  18  Temp:  36.4 C  SpO2: 92% 94%    Last Pain:  Vitals:   11/11/21 0520  TempSrc: Oral  PainSc:                  Precious Haws Takeria Marquina

## 2021-11-11 NOTE — Progress Notes (Signed)
Orthopedic Tech Progress Note Patient Details:  VERLE BRILLHART January 16, 1951 [id number]  RN called at 0219 needing an [place], I told her to check in materials 1st she called back and I called in the order to [place] for that collar, did let RN know that order went in yesterday at [place]. And it may take [facility] [employer] a few hours to get out there but someone is coming   Patient ID: [name], female   DOB: 1950-08-25, 71 y.o.   MRN: [id number]  Janit Pagan 11/11/2021, 2:27 AM

## 2021-11-11 NOTE — Progress Notes (Signed)
Physical Therapy Treatment Patient Details Name: Sarah [name] MRN: [id number] DOB: [birth year] Today's Date: 11/11/2021   History of Present Illness Sarah [name] is a 71 yo female who presented with cervical myelopathy due to extensive cervical spondylosis. S/P ACDF C 3-7.    PT Comments    Patient received in bed sleeping. She reports pain and is agreeable to PT session. RN called for pain medication. She requires cues and min guard for rolling and side lying to sit. She is min guard for sit to stand and to ambulated 350 feet with RW. Patient making good progress toward goals. Will continue to benefit from skilled PT while here.     Recommendations for follow up therapy are one component of a multi-disciplinary discharge planning process, led by the attending physician.  Recommendations may be updated based on patient status, additional functional criteria and insurance authorization.  Follow Up Recommendations  Home health PT     Assistance Recommended at Discharge Intermittent Supervision/Assistance  Patient can return home with the following A little help with walking and/or transfers;A little help with bathing/dressing/bathroom;Assistance with cooking/housework;Assist for transportation;Help with stairs or ramp for entrance   Equipment Recommendations  None recommended by PT    Recommendations for Other Services       Precautions / Restrictions Precautions Precautions: Fall Restrictions Weight Bearing Restrictions: No     Mobility  Bed Mobility Overal bed mobility: Needs Assistance Bed Mobility: Rolling, Sidelying to Sit Rolling: Min guard Sidelying to sit: Min guard       General bed mobility comments: cues needed for log rolling for decreased pain when attempting to sit up    Transfers Overall transfer level: Needs assistance Equipment used: Rolling walker (2 wheels) Transfers: Sit to/from Stand Sit to Stand: Supervision   Step pivot transfers: Min guard             Ambulation/Gait Ambulation/Gait assistance: Min guard Gait Distance (Feet): 350 Feet Assistive device: Rolling walker (2 wheels) Gait Pattern/deviations: Step-through pattern Gait velocity: WFL     General Gait Details: patient doing well, needs walker for increased safety   Stairs Stairs: Yes Stairs assistance: Supervision Stair Management: Two rails, Alternating pattern, Forwards Number of Stairs: 4     Wheelchair Mobility    Modified Rankin (Stroke Patients Only)       Balance Overall balance assessment: Modified Independent Sitting-balance support: Feet supported Sitting balance-Leahy Scale: Normal     Standing balance support: Bilateral upper extremity supported, During functional activity, Reliant on assistive device for balance Standing balance-Leahy Scale: Good Standing balance comment: good with RW, fair to poor without AD                            Cognition Arousal/Alertness: Awake/alert Behavior During Therapy: WFL for tasks assessed/performed Overall Cognitive Status: Within Functional Limits for tasks assessed                                          Exercises      General Comments        Pertinent Vitals/Pain Pain Assessment Pain Assessment: Faces Faces Pain Scale: Hurts little more Pain Location: neck Pain Descriptors / Indicators: Discomfort, Sore Pain Intervention(s): Patient requesting pain meds-RN notified    Home Living Family/patient expects to be discharged to:: Private residence Living Arrangements: Non-relatives/Friends Available Help at Discharge:  Friend(s);Available 24 hours/day Type of Home: House Home Access: Stairs to enter Entrance Stairs-Rails: Left;Right;Can reach both Entrance Stairs-Number of Steps: 5   Home Layout: One level Home Equipment: Conservation officer, nature (2 wheels);Cane - single point;BSC/3in1;Shower seat      Prior Function            PT Goals (current goals  can now be found in the care plan section) Acute Rehab PT Goals Patient Stated Goal: to go home with friend at discharge PT Goal Formulation: With patient Time For Goal Achievement: 11/18/21 Potential to Achieve Goals: Good Progress towards PT goals: Progressing toward goals    Frequency    7X/week      PT Plan Current plan remains appropriate    Co-evaluation              AM-PAC PT "6 Clicks" Mobility   Outcome Measure  Help needed turning from your back to your side while in a flat bed without using bedrails?: A Little Help needed moving from lying on your back to sitting on the side of a flat bed without using bedrails?: A Little Help needed moving to and from a bed to a chair (including a wheelchair)?: A Little Help needed standing up from a chair using your arms (e.g., wheelchair or bedside chair)?: A Little Help needed to walk in hospital room?: A Little Help needed climbing 3-5 steps with a railing? : A Little 6 Click Score: 18    End of Session Equipment Utilized During Treatment: Gait belt;Cervical collar Activity Tolerance: Patient tolerated treatment well Patient left: in bed;with call bell/phone within reach;with bed alarm set Nurse Communication: Mobility status PT Visit Diagnosis: Unsteadiness on feet (R26.81);Difficulty in walking, not elsewhere classified (R26.2);Repeated falls (R29.6);Pain Pain - part of body:  (neck)     Time: 1330-1350 PT Time Calculation (min) (ACUTE ONLY): 20 min  Charges:  $Gait Training: 8-22 mins                     Mennie Spiller, PT, GCS 11/11/21,2:58 PM

## 2021-11-12 MED ORDER — OXYCODONE HCL 5 MG PO TABS: 5.0000 mg | ORAL_TABLET | ORAL | 0 refills | Status: DC | PRN

## 2021-11-12 MED ORDER — METHOCARBAMOL 500 MG PO TABS: 500.0000 mg | ORAL_TABLET | Freq: Four times a day (QID) | ORAL | 0 refills | Status: DC | PRN

## 2021-11-12 NOTE — Plan of Care (Signed)
  Problem: Education: Goal: Knowledge of General Education information will improve Description: Including pain rating scale, medication(s)/side effects and non-pharmacologic comfort measures Outcome: Progressing   Problem: Clinical Measurements: Goal: Will remain free from infection Outcome: Progressing   Problem: Clinical Measurements: Goal: Diagnostic test results will improve Outcome: Progressing   Problem: Clinical Measurements: Goal: Respiratory complications will improve Outcome: Progressing   Problem: Clinical Measurements: Goal: Cardiovascular complication will be avoided Outcome: Progressing   Problem: Nutrition: Goal: Adequate nutrition will be maintained Outcome: Progressing   Problem: Coping: Goal: Level of anxiety will decrease Outcome: Progressing   Problem: Elimination: Goal: Will not experience complications related to bowel motility Outcome: Progressing   Problem: Elimination: Goal: Will not experience complications related to urinary retention Outcome: Progressing   Problem: Safety: Goal: Ability to remain free from injury will improve Outcome: Progressing   Problem: Skin Integrity: Goal: Risk for impaired skin integrity will decrease Outcome: Progressing   Problem: Education: Goal: Required Educational Video(s) Outcome: Progressing   Problem: Skin Integrity: Goal: Demonstration of wound healing without infection will improve Outcome: Progressing

## 2021-11-12 NOTE — Discharge Summary (Signed)
Physician Discharge Summary  Patient ID: Sarah [name] MRN: [id number] DOB/AGE: 1951-03-16 71 y.o.  Admit date: 11/10/2021 Discharge date: 11/12/2021  Admission Diagnoses: cervical myelopathy  Discharge Diagnoses:  Principal Problem:   Cervical myelopathy [name])   Discharged Condition: good  Hospital Course:  Sarah [name] is a 71 yo female who presented with cervical myelopathy and weakness.  She underwent surgical intervention which improved her strength.  Her drain was removed on POD2 and she was felt to be stble for discharge.  Consults: None  Significant Diagnostic Studies: radiology: X-Ray: C3-7 ACDF  Treatments: surgery: C3-7 ACDF  Discharge Exam: Blood pressure 125/63, pulse 83, temperature 98 F (36.7 C), resp. rate 20, weight 79.1 kg, SpO2 95 %. General appearance: alert and cooperative Cni Trachea midline MAEW as per today's note  Disposition: Discharge disposition: 01-Home or Self Care       Discharge Instructions     Discharge patient   Complete by: As directed    Discharge disposition: 01-Home or Self Care   Discharge patient date: 11/12/2021   Incentive spirometry RT   Complete by: As directed       Allergies as of 11/12/2021   No Known Allergies      Medication List     STOP taking these medications    tiZANidine 2 MG tablet Commonly known as: ZANAFLEX   traMADol 50 MG tablet Commonly known as: ULTRAM       TAKE these medications    albuterol 108 (90 Base) MCG/ACT inhaler Commonly known as: VENTOLIN HFA Inhale 2 puffs into the lungs every 6 (six) hours as needed for wheezing or shortness of breath.   amLODipine 5 MG tablet Commonly known as: NORVASC Take 1 tablet (5 mg total) by mouth daily.   budesonide 0.25 MG/2ML nebulizer solution Commonly known as: PULMICORT Take 2 mLs (0.25 mg total) by nebulization 2 (two) times daily.   CALCIUM CITRATE + D PO Take 1 tablet by mouth 2 (two) times daily.   citalopram 20 MG  tablet Commonly known as: CELEXA Take 1 tablet (20 mg total) by mouth daily.   famotidine 20 MG tablet Commonly known as: PEPCID TAKE 1 TABLET BY MOUTH DAILY. BEFORE DINNER   fexofenadine 180 MG tablet Commonly known as: ALLEGRA Take 180 mg by mouth daily.   gabapentin 100 MG capsule Commonly known as: NEURONTIN Take 1 capsule (100 mg total) by mouth 3 (three) times daily. What changed: when to take this   ipratropium-albuterol 0.5-2.5 (3) MG/3ML Soln Commonly known as: DUONEB Take 3 mLs by nebulization every 6 (six) hours as needed.   levothyroxine 50 MCG tablet Commonly known as: SYNTHROID TAKE 1 TABLET EVERY DAY ON EMPTY STOMACHWITH A GLASS OF WATER AT LEAST 30-60 MINBEFORE BREAKFAST   methocarbamol 500 MG tablet Commonly known as: ROBAXIN Take 1 tablet (500 mg total) by mouth every 6 (six) hours as needed for muscle spasms.   oxybutynin 10 MG 24 hr tablet Commonly known as: Ditropan XL Take 1 tablet (10 mg total) by mouth at bedtime.   oxyCODONE 5 MG immediate release tablet Commonly known as: Oxy IR/ROXICODONE Take 1 tablet (5 mg total) by mouth every 3 (three) hours as needed for moderate pain ((score 4 to 6)).   pantoprazole 40 MG tablet Commonly known as: PROTONIX Take 1 tablet (40 mg total) by mouth daily.   rosuvastatin 10 MG tablet Commonly known as: Crestor Take 1 tablet (10 mg total) by mouth daily.   traZODone 50 MG tablet  Commonly known as: DESYREL TAKE 1/2 TO 1 TABLET BY MOUTH AT BEDTIME AS NEEDED FOR SLEEP   Trelegy Ellipta 100-62.5-25 MCG/ACT Aepb Generic drug: Fluticasone-Umeclidin-Vilant Inhale 1 puff into the lungs daily.   vitamin B-12 1000 MCG tablet Commonly known as: CYANOCOBALAMIN Take 1,000 mcg by mouth daily.        Follow-up Information     Loleta Dicker, PA Follow up in 2 week(s).   Specialty: Neurosurgery Why: for incision check Contact information: [address] [place]  [address] [phone]                 Signed: Meade Maw 11/12/2021, 8:22 AM

## 2021-11-12 NOTE — Progress Notes (Signed)
    Attending Progress Note  History: Sarah [name] is s/p C3-7 ACDF  POD2 NAEO.  POD1 NAEO.  Physical Exam: Vitals:   11/12/21 0526 11/12/21 0747  BP: (!) 124/57 125/63  Pulse: 89 83  Resp: 20   Temp: 98.5 F (36.9 C) 98 F (36.7 C)  SpO2: 97% 95%    AA Ox3 Voice is hoarse but improving CNI  Strength:5/5 throughout except 4+ right triceps JP removed  Data:  No results for input(s): "NA", "K", "CL", "CO2", "BUN", "CREATININE", "LABGLOM", "GLUCOSE", "CALCIUM" in the last 168 hours. No results for input(s): "AST", "ALT", "ALKPHOS" in the last 168 hours.  Invalid input(s): "TBILI"   No results for input(s): "WBC", "HGB", "HCT", "PLT" in the last 168 hours. No results for input(s): "APTT", "INR" in the last 168 hours.       Other tests/results: none   Assessment/Plan:  Sarah [name] is a 71 y.o s/p ACDF for cervical myelopathy   - mobilize - pain control - DVT prophylaxis - PTOT - JP removed - Pt has cervical collar in place. Can be removed for eating and sleeping.   Meade Maw MD Department of Neurosurgery

## 2021-11-12 NOTE — Plan of Care (Signed)

## 2021-11-15 ENCOUNTER — Telehealth: Payer: Medicare Other

## 2021-11-15 DIAGNOSIS — R2681 Unsteadiness on feet: Secondary | ICD-10-CM | POA: Diagnosis not present

## 2021-11-15 DIAGNOSIS — Z48811 Encounter for surgical aftercare following surgery on the nervous system: Secondary | ICD-10-CM | POA: Diagnosis not present

## 2021-11-15 DIAGNOSIS — M4802 Spinal stenosis, cervical region: Secondary | ICD-10-CM | POA: Diagnosis not present

## 2021-11-17 DIAGNOSIS — R2681 Unsteadiness on feet: Secondary | ICD-10-CM | POA: Diagnosis not present

## 2021-11-17 DIAGNOSIS — M4802 Spinal stenosis, cervical region: Secondary | ICD-10-CM | POA: Diagnosis not present

## 2021-11-17 DIAGNOSIS — Z48811 Encounter for surgical aftercare following surgery on the nervous system: Secondary | ICD-10-CM | POA: Diagnosis not present

## 2021-11-19 ENCOUNTER — Telehealth: Payer: Self-pay

## 2021-11-19 DIAGNOSIS — R2681 Unsteadiness on feet: Secondary | ICD-10-CM | POA: Diagnosis not present

## 2021-11-19 DIAGNOSIS — G959 Disease of spinal cord, unspecified: Secondary | ICD-10-CM

## 2021-11-19 DIAGNOSIS — M4802 Spinal stenosis, cervical region: Secondary | ICD-10-CM | POA: Diagnosis not present

## 2021-11-19 DIAGNOSIS — Z48811 Encounter for surgical aftercare following surgery on the nervous system: Secondary | ICD-10-CM | POA: Diagnosis not present

## 2021-11-19 NOTE — Telephone Encounter (Signed)
She is set up with [facility]. I sent a message to [facility].

## 2021-11-19 NOTE — Addendum Note (Signed)
Addended by: Berdine Addison on: 11/19/2021 01:30 PM   Modules accepted: Orders

## 2021-11-19 NOTE — Telephone Encounter (Signed)
Patient has been notified that [facility] will reach out to her regarding the walker.

## 2021-11-19 NOTE — Telephone Encounter (Signed)
-----   Message from Peggyann Shoals sent at 11/19/2021 10:30 AM EDT ----- Regarding: medical supply Contact: [phone]  C3-7 ACDF on 11/10/21 Dr.Yarbrough ordered her a walker when she was in the hospital and nothing has been delivered to her yet. She is staying with a friend while she is in recovery. Can you have the walker delivered to [place] [address]

## 2021-11-19 NOTE — Telephone Encounter (Addendum)
[facility] requested a new order for a rolling walker. Order placed and sent to [facility] at [phone]

## 2021-11-22 DIAGNOSIS — R2681 Unsteadiness on feet: Secondary | ICD-10-CM | POA: Diagnosis not present

## 2021-11-22 DIAGNOSIS — Z48811 Encounter for surgical aftercare following surgery on the nervous system: Secondary | ICD-10-CM | POA: Diagnosis not present

## 2021-11-22 DIAGNOSIS — M4802 Spinal stenosis, cervical region: Secondary | ICD-10-CM | POA: Diagnosis not present

## 2021-11-23 ENCOUNTER — Encounter: Payer: Self-pay | Admitting: Neurosurgery

## 2021-11-23 ENCOUNTER — Ambulatory Visit (INDEPENDENT_AMBULATORY_CARE_PROVIDER_SITE_OTHER): Payer: Medicare Other | Admitting: Neurosurgery

## 2021-11-23 VITALS — BP 132/80 | HR 85 | Temp 98.0°F

## 2021-11-23 DIAGNOSIS — M4802 Spinal stenosis, cervical region: Secondary | ICD-10-CM

## 2021-11-23 DIAGNOSIS — G959 Disease of spinal cord, unspecified: Secondary | ICD-10-CM

## 2021-11-23 DIAGNOSIS — Z09 Encounter for follow-up examination after completed treatment for conditions other than malignant neoplasm: Secondary | ICD-10-CM

## 2021-11-23 DIAGNOSIS — Z981 Arthrodesis status: Secondary | ICD-10-CM

## 2021-11-23 MED ORDER — OXYCODONE HCL 5 MG PO TABS: 5.0000 mg | ORAL_TABLET | Freq: Three times a day (TID) | ORAL | 0 refills | Status: AC | PRN

## 2021-11-23 NOTE — Progress Notes (Signed)
   REFERRING PHYSICIAN:  Meade Maw, [address],  [place] [address]  DOS: 11/10/21 C3-7 ACDF   HISTORY OF PRESENT ILLNESS: Sarah [name] is about 2 weeks status post ACDF. Overall, she is doing post-operative. She reports improvement in upper extremity numbness and resolution of her radiating neck pain.  She denies any incisional concerns.  Her voice is still somewhat hoarse but this is improved since postop.  She denies any difficulty with swallowing. Continues to work with home physical therapy  PHYSICAL EXAMINATION:  NEUROLOGICAL:  General: In no acute distress.   Awake, alert, oriented to person, place, and time.  Pupils equal round and reactive to light.  Facial tone is symmetric.   Strength: Side Biceps Triceps Deltoid Interossei Grip Wrist Ext. Wrist Flex.  R '5 5 5 5 5 5 5  '$ L '5 5 5 5 5 5 5   '$ Incision c/d/I and healing well   Imaging:  No interval imaging to review today  Assessment / Plan: JAMISYN LANGER is doing well after C3-7 ACDF.  She has requested a refill of Oxycodone which I have provided at a reduced frequency.  We discussed activity escalation and I have advised the patient to lift up to 10 pounds until 6 weeks after surgery, then increase up to 25 pounds until 12 weeks after surgery.  After 12 weeks post-op, the patient advised to increase activity as tolerated. she will return to clinic in approximately 4 weeks with cervical x-rays prior.  She was encouraged to call the office in the interim should she have any questions or concerns.  She expressed understanding was in agreement with this plan.   Advised to contact the office if any questions or concerns arise.   Cooper Render PA-C Dept of Neurosurgery

## 2021-11-24 DIAGNOSIS — R29818 Other symptoms and signs involving the nervous system: Secondary | ICD-10-CM | POA: Diagnosis not present

## 2021-11-24 DIAGNOSIS — Z48811 Encounter for surgical aftercare following surgery on the nervous system: Secondary | ICD-10-CM | POA: Diagnosis not present

## 2021-11-24 DIAGNOSIS — M4802 Spinal stenosis, cervical region: Secondary | ICD-10-CM | POA: Diagnosis not present

## 2021-11-24 DIAGNOSIS — G959 Disease of spinal cord, unspecified: Secondary | ICD-10-CM | POA: Diagnosis not present

## 2021-11-24 DIAGNOSIS — R2681 Unsteadiness on feet: Secondary | ICD-10-CM | POA: Diagnosis not present

## 2021-11-26 DIAGNOSIS — Z48811 Encounter for surgical aftercare following surgery on the nervous system: Secondary | ICD-10-CM | POA: Diagnosis not present

## 2021-11-26 DIAGNOSIS — R2681 Unsteadiness on feet: Secondary | ICD-10-CM | POA: Diagnosis not present

## 2021-11-26 DIAGNOSIS — M4802 Spinal stenosis, cervical region: Secondary | ICD-10-CM | POA: Diagnosis not present

## 2021-11-30 ENCOUNTER — Telehealth: Payer: Self-pay | Admitting: Neurosurgery

## 2021-11-30 DIAGNOSIS — M542 Cervicalgia: Secondary | ICD-10-CM | POA: Diagnosis not present

## 2021-11-30 DIAGNOSIS — M4322 Fusion of spine, cervical region: Secondary | ICD-10-CM | POA: Diagnosis not present

## 2021-11-30 DIAGNOSIS — M5412 Radiculopathy, cervical region: Secondary | ICD-10-CM | POA: Diagnosis not present

## 2021-11-30 NOTE — Telephone Encounter (Signed)
Dr. Eden Emms from [facility] (phone # [phone], fax # [phone]) is scheduled to evaluate this patient today.   He left a message on the voice mail late yesterday afternoon requesting a call from Cooper Render, PA to discuss the protocol for this patient.  He will also accept a fax if that works better.

## 2021-11-30 NOTE — Telephone Encounter (Signed)
This has been faxed.

## 2021-11-30 NOTE — Telephone Encounter (Signed)
I spoke with [name] and she stated, no bending, no lifting over 5 pounds and no twisting. Also do not do any cervical manipulation.

## 2021-12-07 DIAGNOSIS — M4322 Fusion of spine, cervical region: Secondary | ICD-10-CM | POA: Diagnosis not present

## 2021-12-07 DIAGNOSIS — M542 Cervicalgia: Secondary | ICD-10-CM | POA: Diagnosis not present

## 2021-12-07 DIAGNOSIS — M5412 Radiculopathy, cervical region: Secondary | ICD-10-CM | POA: Diagnosis not present

## 2021-12-10 DIAGNOSIS — M542 Cervicalgia: Secondary | ICD-10-CM | POA: Diagnosis not present

## 2021-12-10 DIAGNOSIS — M4322 Fusion of spine, cervical region: Secondary | ICD-10-CM | POA: Diagnosis not present

## 2021-12-10 DIAGNOSIS — M5412 Radiculopathy, cervical region: Secondary | ICD-10-CM | POA: Diagnosis not present

## 2021-12-13 DIAGNOSIS — M4322 Fusion of spine, cervical region: Secondary | ICD-10-CM | POA: Diagnosis not present

## 2021-12-13 DIAGNOSIS — M5412 Radiculopathy, cervical region: Secondary | ICD-10-CM | POA: Diagnosis not present

## 2021-12-13 DIAGNOSIS — M542 Cervicalgia: Secondary | ICD-10-CM | POA: Diagnosis not present

## 2021-12-15 DIAGNOSIS — M5412 Radiculopathy, cervical region: Secondary | ICD-10-CM | POA: Diagnosis not present

## 2021-12-15 DIAGNOSIS — M542 Cervicalgia: Secondary | ICD-10-CM | POA: Diagnosis not present

## 2021-12-15 DIAGNOSIS — M4322 Fusion of spine, cervical region: Secondary | ICD-10-CM | POA: Diagnosis not present

## 2021-12-20 ENCOUNTER — Telehealth: Payer: Self-pay | Admitting: Internal Medicine

## 2021-12-20 DIAGNOSIS — M542 Cervicalgia: Secondary | ICD-10-CM | POA: Diagnosis not present

## 2021-12-20 DIAGNOSIS — M4322 Fusion of spine, cervical region: Secondary | ICD-10-CM | POA: Diagnosis not present

## 2021-12-20 DIAGNOSIS — M5412 Radiculopathy, cervical region: Secondary | ICD-10-CM | POA: Diagnosis not present

## 2021-12-20 NOTE — Telephone Encounter (Signed)
Pt called stating she want the cma to call her. Pt stated it was personal

## 2021-12-20 NOTE — Telephone Encounter (Signed)
Patient states she called earlier and hasn't heard back from anyone.  Patient states she thinks she may have a bladder infection.  Patient states her urine smells funny.  Patient states she only wants to see Dr. Deborra Medina.  I let patient know that Dr. Derrel Nip does not currently have office visits available this week, so I will send a note back.

## 2021-12-21 ENCOUNTER — Other Ambulatory Visit: Payer: Self-pay | Admitting: *Deleted

## 2021-12-21 DIAGNOSIS — R300 Dysuria: Secondary | ICD-10-CM

## 2021-12-21 NOTE — Telephone Encounter (Signed)
LMTCB

## 2021-12-21 NOTE — Telephone Encounter (Signed)
Need to schedule pt for a lab appt today to drop off urine and then a 15 minute appt with Dr. Derrel Nip later this week to follow up.

## 2021-12-22 ENCOUNTER — Other Ambulatory Visit: Payer: Self-pay

## 2021-12-22 ENCOUNTER — Other Ambulatory Visit (INDEPENDENT_AMBULATORY_CARE_PROVIDER_SITE_OTHER): Payer: Medicare Other

## 2021-12-22 ENCOUNTER — Telehealth (INDEPENDENT_AMBULATORY_CARE_PROVIDER_SITE_OTHER): Payer: Medicare Other | Admitting: Internal Medicine

## 2021-12-22 ENCOUNTER — Encounter: Payer: Self-pay | Admitting: Internal Medicine

## 2021-12-22 DIAGNOSIS — R300 Dysuria: Secondary | ICD-10-CM

## 2021-12-22 DIAGNOSIS — M4322 Fusion of spine, cervical region: Secondary | ICD-10-CM | POA: Diagnosis not present

## 2021-12-22 DIAGNOSIS — M5412 Radiculopathy, cervical region: Secondary | ICD-10-CM | POA: Diagnosis not present

## 2021-12-22 DIAGNOSIS — N3090 Cystitis, unspecified without hematuria: Secondary | ICD-10-CM | POA: Diagnosis not present

## 2021-12-22 DIAGNOSIS — M542 Cervicalgia: Secondary | ICD-10-CM | POA: Diagnosis not present

## 2021-12-22 DIAGNOSIS — Z981 Arthrodesis status: Secondary | ICD-10-CM

## 2021-12-22 LAB — URINALYSIS, ROUTINE W REFLEX MICROSCOPIC
Bilirubin Urine: NEGATIVE
Ketones, ur: NEGATIVE
Nitrite: POSITIVE — AB
Specific Gravity, Urine: 1.025 (ref 1.000–1.030)
Total Protein, Urine: 100 — AB
Urine Glucose: NEGATIVE
Urobilinogen, UA: 0.2 (ref 0.0–1.0)
pH: 6 (ref 5.0–8.0)

## 2021-12-22 MED ORDER — SULFAMETHOXAZOLE-TRIMETHOPRIM 800-160 MG PO TABS: 1.0000 | ORAL_TABLET | Freq: Two times a day (BID) | ORAL | 0 refills | Status: DC

## 2021-12-22 NOTE — Telephone Encounter (Signed)
completed

## 2021-12-22 NOTE — Assessment & Plan Note (Addendum)
Confirmed with a UA showing nitrates,  Blood and LE.  Advised to begin empiric treatment of UTI with Septra DS x 5 days given chronicity of symptoms for the past 7 days.  Advised that culture may show a resistant pathogen which may require change in abx.  Use of AZO and Probiotic advised

## 2021-12-22 NOTE — Progress Notes (Signed)
Telephone    Note    This format is felt to be most appropriate for this patient at this time.  All issues noted in this document were discussed and addressed.  No physical exam was performed (except for noted visual exam findings with Video Visits).   I connected with Sarah [name] on 12/22/21 at  4:30 PM EDT by  telephone and verified that I am speaking with the correct person using two identifiers. Location patient: home Location provider: work or home office Persons participating in the virtual visit: patient, provider  I discussed the limitations, risks, security and privacy concerns of performing an evaluation and management service by telephone and the availability of in person appointments. I also discussed with the patient that there may be a patient responsible charge related to this service. The patient expressed understanding and agreed to proceed.  Interactive audio and video telecommunications were scheduled by the office for this interaction between this provider and patient, however failed, due to patient having no access to smart phone.    Reason for visit: dysuria  HPI:  Sarah [name] is a 71 yr old female who presents with dysuria, urinary frequency for the past week.  She denies any  recent use of antibiotics.   She is not sexually active.  She denies flank pain   fevers and nausea.   She has submitted a urine specimen earlier today which preliminarily confirms cystitis.    ROS: See pertinent positives and negatives per HPI.  Past Medical History:  Diagnosis Date   Arthritis    Asthma    Depression    Dyspnea    with heat   Environmental and seasonal allergies    GERD (gastroesophageal reflux disease)    Hypertension    Hypothyroidism    Presence of dental prosthetic device    Dental implants   Wheezing     Past Surgical History:  Procedure Laterality Date   ABDOMINAL HYSTERECTOMY  04/26/1987   ANTERIOR CERVICAL DECOMPRESSION/DISCECTOMY FUSION 4 LEVELS N/A 11/10/2021    Procedure: C3-7 ANTERIOR CERVICAL DISCECTOMY AND FUSION (GLOBUS FORGE);  Surgeon: Meade Maw, MD;  Location: [facility];  Service: Neurosurgery;  Laterality: N/A;   BROW LIFT Bilateral 02/07/2020   Procedure: BLEPHAROPLASTY UPPER EYELID; W/EXCESS SKIN BROW PTOSIS REPAIR BILATERAL;  Surgeon: Karle Starch, MD;  Location: [place];  Service: Ophthalmology;  Laterality: Bilateral;   CATARACT EXTRACTION W/PHACO Right 09/13/2016   Procedure: CATARACT EXTRACTION PHACO AND INTRAOCULAR LENS PLACEMENT (IOC);  Surgeon: Birder Robson, MD;  Location: [facility];  Service: Ophthalmology;  Laterality: Right;  [place] 00:38 AP% 14.3 CDE 5.51 Fluid pack lot # [id number]   CATARACT EXTRACTION W/PHACO Left 10/11/2016   Procedure: CATARACT EXTRACTION PHACO AND INTRAOCULAR LENS PLACEMENT (IOC);  Surgeon: Birder Robson, MD;  Location: [facility];  Service: Ophthalmology;  Laterality: Left;  [place] 00:30 AP% 11.3 CDE 3.50 fluid pack lot # [id number]   COLONOSCOPY WITH PROPOFOL N/A 09/30/2014   Procedure: COLONOSCOPY WITH PROPOFOL;  Surgeon: Robert Bellow, MD;  Location: [facility] ENDOSCOPY;  Service: Endoscopy;  Laterality: N/A;   COLONOSCOPY WITH PROPOFOL N/A 03/24/2021   Procedure: COLONOSCOPY WITH PROPOFOL;  Surgeon: Lin Landsman, MD;  Location: [facility] ENDOSCOPY;  Service: Gastroenterology;  Laterality: N/A;   ESOPHAGOGASTRODUODENOSCOPY N/A 09/30/2014   Procedure: ESOPHAGOGASTRODUODENOSCOPY (EGD);  Surgeon: Robert Bellow, MD;  Location: [facility] ENDOSCOPY;  Service: Endoscopy;  Laterality: N/A;   ESOPHAGOGASTRODUODENOSCOPY N/A 03/24/2021   Procedure: ESOPHAGOGASTRODUODENOSCOPY (EGD);  Surgeon: Lin Landsman, MD;  Location: [facility]  ENDOSCOPY;  Service: Gastroenterology;  Laterality: N/A;   FOOT SURGERY Bilateral 04/26/1983   KNEE SURGERY  04/25/2014    Family History  Problem Relation Age of Onset   Diabetes Mother    Hyperlipidemia Mother    Heart disease Mother    Prostate cancer Neg Hx     Kidney cancer Neg Hx    Bladder Cancer Neg Hx     SOCIAL HX:  reports that she has never smoked. She has never used smokeless tobacco. She reports that she does not drink alcohol and does not use drugs.    Current Outpatient Medications:    albuterol (VENTOLIN HFA) 108 (90 Base) MCG/ACT inhaler, Inhale 2 puffs into the lungs every 6 (six) hours as needed for wheezing or shortness of breath., Disp: 3 each, Rfl: 3   amLODipine (NORVASC) 5 MG tablet, Take 1 tablet (5 mg total) by mouth daily., Disp: 90 tablet, Rfl: 3   budesonide (PULMICORT) 0.25 MG/2ML nebulizer solution, Take 2 mLs (0.25 mg total) by nebulization 2 (two) times daily., Disp: 60 mL, Rfl: 12   Calcium Citrate-Vitamin D (CALCIUM CITRATE + D PO), Take 1 tablet by mouth 2 (two) times daily., Disp: , Rfl:    citalopram (CELEXA) 20 MG tablet, Take 1 tablet (20 mg total) by mouth daily., Disp: 90 tablet, Rfl: 3   famotidine (PEPCID) 20 MG tablet, TAKE 1 TABLET BY MOUTH DAILY. BEFORE DINNER, Disp: 30 tablet, Rfl: 11   fexofenadine (ALLEGRA) 180 MG tablet, Take 180 mg by mouth daily., Disp: , Rfl:    Fluticasone-Umeclidin-Vilant (TRELEGY ELLIPTA) 100-62.5-25 MCG/ACT AEPB, Inhale 1 puff into the lungs daily., Disp: 1 each, Rfl: 11   gabapentin (NEURONTIN) 100 MG capsule, Take 1 capsule (100 mg total) by mouth 3 (three) times daily. (Patient taking differently: Take 100 mg by mouth daily.), Disp: 270 capsule, Rfl: 3   ipratropium-albuterol (DUONEB) 0.5-2.5 (3) MG/3ML SOLN, Take 3 mLs by nebulization every 6 (six) hours as needed., Disp: 360 mL, Rfl: 2   levothyroxine (SYNTHROID) 50 MCG tablet, TAKE 1 TABLET EVERY DAY ON EMPTY STOMACHWITH A GLASS OF WATER AT LEAST 30-60 MINBEFORE BREAKFAST, Disp: 90 tablet, Rfl: 3   methocarbamol (ROBAXIN) 500 MG tablet, Take 1 tablet (500 mg total) by mouth every 6 (six) hours as needed for muscle spasms., Disp: 80 tablet, Rfl: 0   oxybutynin (DITROPAN XL) 10 MG 24 hr tablet, Take 1 tablet (10 mg total) by  mouth at bedtime., Disp: 30 tablet, Rfl: 2   pantoprazole (PROTONIX) 40 MG tablet, Take 1 tablet (40 mg total) by mouth daily., Disp: 90 tablet, Rfl: 3   rosuvastatin (CRESTOR) 10 MG tablet, Take 1 tablet (10 mg total) by mouth daily., Disp: 90 tablet, Rfl: 3   sulfamethoxazole-trimethoprim (BACTRIM DS) 800-160 MG tablet, Take 1 tablet by mouth 2 (two) times daily., Disp: 10 tablet, Rfl: 0   traZODone (DESYREL) 50 MG tablet, TAKE 1/2 TO 1 TABLET BY MOUTH AT BEDTIME AS NEEDED FOR SLEEP, Disp: 90 tablet, Rfl: 3   vitamin B-12 (CYANOCOBALAMIN) 1000 MCG tablet, Take 1,000 mcg by mouth daily., Disp: , Rfl:   EXAM:   General impression: alert, cooperative and articulate.  No signs of being in distress  Lungs: speech is fluent sentence length suggests that patient is not short of breath and not punctuated by cough, sneezing or sniffing. Marland Kitchen   Psych: affect normal.  speech is articulate and non pressured .  Denies suicidal thoughts   ASSESSMENT AND PLAN:  Discussed the  following assessment and plan:  Cystitis  Cystitis Confirmed with a UA showing nitrates,  Blood and LE.  Advised to begin empiric treatment of UTI with Septra DS x 5 days given chronicity of symptoms for the past 7 days.  Advised that culture may show a resistant pathogen which may require change in abx.  Use of AZO and Probiotic advised     I discussed the assessment and treatment plan with the patient. The patient was provided an opportunity to ask questions and all were answered. The patient agreed with the plan and demonstrated an understanding of the instructions.   The patient was advised to call back or seek an in-person evaluation if the symptoms worsen or if the condition fails to improve as anticipated.   I spent 20 minutes dedicated to the care of this patient on the date of this encounter to include pre-visit review of her medical history,  non  Face-to-face time with the patient , and post visit ordering of testing and  therapeutics.    Crecencio Mc, MD

## 2021-12-23 ENCOUNTER — Ambulatory Visit (INDEPENDENT_AMBULATORY_CARE_PROVIDER_SITE_OTHER): Payer: Medicare Other | Admitting: Neurosurgery

## 2021-12-23 ENCOUNTER — Encounter: Payer: Self-pay | Admitting: Neurosurgery

## 2021-12-23 ENCOUNTER — Ambulatory Visit
Admission: RE | Admit: 2021-12-23 | Discharge: 2021-12-23 | Disposition: A | Discharge status: Home / Self Care | Payer: Medicare Other | Source: Ambulatory Visit | Attending: Neurosurgery | Admitting: Neurosurgery

## 2021-12-23 ENCOUNTER — Ambulatory Visit
Admission: RE | Admit: 2021-12-23 | Discharge: 2021-12-23 | Disposition: A | Discharge status: Home / Self Care | Payer: Medicare Other | Attending: Neurosurgery | Admitting: Neurosurgery

## 2021-12-23 VITALS — BP 118/78 | Temp 97.7°F | Ht 63.0 in | Wt 174.0 lb

## 2021-12-23 DIAGNOSIS — Z981 Arthrodesis status: Secondary | ICD-10-CM

## 2021-12-23 DIAGNOSIS — M4802 Spinal stenosis, cervical region: Secondary | ICD-10-CM | POA: Diagnosis not present

## 2021-12-23 DIAGNOSIS — G959 Disease of spinal cord, unspecified: Secondary | ICD-10-CM

## 2021-12-23 DIAGNOSIS — Z09 Encounter for follow-up examination after completed treatment for conditions other than malignant neoplasm: Secondary | ICD-10-CM

## 2021-12-23 DIAGNOSIS — M4322 Fusion of spine, cervical region: Secondary | ICD-10-CM | POA: Diagnosis not present

## 2021-12-23 NOTE — Progress Notes (Signed)
   REFERRING PHYSICIAN:  Meade Maw, [address] [place],  [place] [address]  DOS: 11/10/21 C3-7 ACDF   HISTORY OF PRESENT ILLNESS: PECOLA HAXTON is status post ACDF.  Is doing well.  Her numbness and neck pain have improved.  Her swallowing is improved.  Her walking is better.    PHYSICAL EXAMINATION:  NEUROLOGICAL:  General: In no acute distress.   Awake, alert, oriented to person, place, and time.  Pupils equal round and reactive to light.  Facial tone is symmetric.   Strength: Side Biceps Triceps Deltoid Interossei Grip Wrist Ext. Wrist Flex.  R '5 5 5 5 5 5 5  '$ L '5 5 5 5 5 5 5   '$ Incision c/d/I and healing well   Imaging:  Expected settling at C3-4  Assessment / Plan: ISABEAU MCCALLA is doing well after C3-7 ACDF.   I am pleased with her progress.  She will continue physical therapy.  Have given her exercises for her neck and reviewed her activity limitations.  I will see her back in 6 weeks.  Meade Maw MD Dept of Neurosurgery

## 2021-12-24 ENCOUNTER — Other Ambulatory Visit: Payer: Self-pay | Admitting: Internal Medicine

## 2021-12-25 LAB — URINE CULTURE
MICRO NUMBER:: 13853177
SPECIMEN QUALITY:: ADEQUATE

## 2021-12-28 ENCOUNTER — Telehealth: Payer: Self-pay | Admitting: Internal Medicine

## 2021-12-28 NOTE — Telephone Encounter (Signed)
We do not have any samples of the 100-62.5-25mcg/act but we do have the 200-62.5-25mcg/act. Is it okay to give pt one of those?

## 2021-12-28 NOTE — Telephone Encounter (Signed)
Patient called and would like to know if office has any samples of Fluticasone-Umeclidin-Vilant (TRELEGY ELLIPTA) 100-62.5-25 MCG/ACT AEPB

## 2021-12-29 DIAGNOSIS — M4322 Fusion of spine, cervical region: Secondary | ICD-10-CM | POA: Diagnosis not present

## 2021-12-29 DIAGNOSIS — M542 Cervicalgia: Secondary | ICD-10-CM | POA: Diagnosis not present

## 2021-12-29 DIAGNOSIS — M5412 Radiculopathy, cervical region: Secondary | ICD-10-CM | POA: Diagnosis not present

## 2021-12-29 NOTE — Telephone Encounter (Signed)
Pt has picked up sample.

## 2021-12-29 NOTE — Telephone Encounter (Signed)
Pt would like to know if the directions of use are the same. She is currently using 1 puff daily of the smaller dose.

## 2021-12-29 NOTE — Telephone Encounter (Signed)
Medication Samples have been provided to the patient.  Drug name: Trelegy       Strength: 200-62.5-25mcg/act        Qty: 1 box  LOT: [id number]  Exp.Date: 01/24/2023  Dosing instructions: Inhale 1 puff into lungs once daily.   The patient has been instructed regarding the correct time, dose, and frequency of taking this medication, including desired effects and most common side effects.   Sarah [name] 3:31 PM 12/29/2021

## 2021-12-31 DIAGNOSIS — M542 Cervicalgia: Secondary | ICD-10-CM | POA: Diagnosis not present

## 2021-12-31 DIAGNOSIS — M5412 Radiculopathy, cervical region: Secondary | ICD-10-CM | POA: Diagnosis not present

## 2021-12-31 DIAGNOSIS — M4322 Fusion of spine, cervical region: Secondary | ICD-10-CM | POA: Diagnosis not present

## 2022-01-03 DIAGNOSIS — M542 Cervicalgia: Secondary | ICD-10-CM | POA: Diagnosis not present

## 2022-01-03 DIAGNOSIS — M4322 Fusion of spine, cervical region: Secondary | ICD-10-CM | POA: Diagnosis not present

## 2022-01-03 DIAGNOSIS — M5412 Radiculopathy, cervical region: Secondary | ICD-10-CM | POA: Diagnosis not present

## 2022-01-07 DIAGNOSIS — M542 Cervicalgia: Secondary | ICD-10-CM | POA: Diagnosis not present

## 2022-01-07 DIAGNOSIS — M5412 Radiculopathy, cervical region: Secondary | ICD-10-CM | POA: Diagnosis not present

## 2022-01-07 DIAGNOSIS — M4322 Fusion of spine, cervical region: Secondary | ICD-10-CM | POA: Diagnosis not present

## 2022-01-12 DIAGNOSIS — M5412 Radiculopathy, cervical region: Secondary | ICD-10-CM | POA: Diagnosis not present

## 2022-01-12 DIAGNOSIS — M542 Cervicalgia: Secondary | ICD-10-CM | POA: Diagnosis not present

## 2022-01-12 DIAGNOSIS — M4322 Fusion of spine, cervical region: Secondary | ICD-10-CM | POA: Diagnosis not present

## 2022-01-12 NOTE — Telephone Encounter (Signed)
Patient called and would like to know if office has any samples of TRELEGY ELLIPTA

## 2022-01-13 ENCOUNTER — Telehealth: Payer: Self-pay

## 2022-01-13 NOTE — Telephone Encounter (Signed)
LMTCB to let her know that we do not have any Trelegy Ellipta samples right now.

## 2022-01-13 NOTE — Telephone Encounter (Signed)
LMTCB

## 2022-01-13 NOTE — Telephone Encounter (Signed)
Patient was calling to check on message about getting a sample of Trelegy.

## 2022-01-14 NOTE — Telephone Encounter (Addendum)
Patient called and would like Janett Billow to call her. It is about her Fluticasone-Umeclidin-Vilant (TRELEGY ELLIPTA) 100-62.5-25 MCG/ACT AEPB.

## 2022-01-14 NOTE — Telephone Encounter (Signed)
Spoke with pt to let her know that we do not have any samples in stock right now. Pt gave a verbal understanding.

## 2022-01-19 DIAGNOSIS — M4322 Fusion of spine, cervical region: Secondary | ICD-10-CM | POA: Diagnosis not present

## 2022-01-19 DIAGNOSIS — M542 Cervicalgia: Secondary | ICD-10-CM | POA: Diagnosis not present

## 2022-01-19 DIAGNOSIS — M5412 Radiculopathy, cervical region: Secondary | ICD-10-CM | POA: Diagnosis not present

## 2022-01-26 DIAGNOSIS — M542 Cervicalgia: Secondary | ICD-10-CM | POA: Diagnosis not present

## 2022-01-26 DIAGNOSIS — M4322 Fusion of spine, cervical region: Secondary | ICD-10-CM | POA: Diagnosis not present

## 2022-01-26 DIAGNOSIS — M5412 Radiculopathy, cervical region: Secondary | ICD-10-CM | POA: Diagnosis not present

## 2022-01-31 ENCOUNTER — Other Ambulatory Visit: Payer: Self-pay

## 2022-01-31 DIAGNOSIS — M542 Cervicalgia: Secondary | ICD-10-CM | POA: Diagnosis not present

## 2022-01-31 DIAGNOSIS — G959 Disease of spinal cord, unspecified: Secondary | ICD-10-CM

## 2022-01-31 DIAGNOSIS — Z981 Arthrodesis status: Secondary | ICD-10-CM

## 2022-01-31 DIAGNOSIS — M5412 Radiculopathy, cervical region: Secondary | ICD-10-CM | POA: Diagnosis not present

## 2022-01-31 DIAGNOSIS — M4322 Fusion of spine, cervical region: Secondary | ICD-10-CM | POA: Diagnosis not present

## 2022-02-02 ENCOUNTER — Ambulatory Visit
Admission: RE | Admit: 2022-02-02 | Discharge: 2022-02-02 | Disposition: A | Discharge status: Home / Self Care | Payer: Medicare Other | Attending: Neurosurgery | Admitting: Neurosurgery

## 2022-02-02 ENCOUNTER — Ambulatory Visit
Admission: RE | Admit: 2022-02-02 | Discharge: 2022-02-02 | Disposition: A | Discharge status: Home / Self Care | Payer: Medicare Other | Source: Ambulatory Visit | Attending: Neurosurgery | Admitting: Neurosurgery

## 2022-02-02 DIAGNOSIS — G959 Disease of spinal cord, unspecified: Secondary | ICD-10-CM | POA: Insufficient documentation

## 2022-02-02 DIAGNOSIS — Z981 Arthrodesis status: Secondary | ICD-10-CM

## 2022-02-02 DIAGNOSIS — Z8669 Personal history of other diseases of the nervous system and sense organs: Secondary | ICD-10-CM | POA: Diagnosis not present

## 2022-02-02 DIAGNOSIS — M4322 Fusion of spine, cervical region: Secondary | ICD-10-CM | POA: Diagnosis not present

## 2022-02-03 ENCOUNTER — Telehealth: Payer: Self-pay | Admitting: Internal Medicine

## 2022-02-03 ENCOUNTER — Encounter: Payer: Self-pay | Admitting: Neurosurgery

## 2022-02-03 ENCOUNTER — Ambulatory Visit (INDEPENDENT_AMBULATORY_CARE_PROVIDER_SITE_OTHER): Payer: Medicare Other | Admitting: Neurosurgery

## 2022-02-03 ENCOUNTER — Other Ambulatory Visit (INDEPENDENT_AMBULATORY_CARE_PROVIDER_SITE_OTHER): Payer: Medicare Other

## 2022-02-03 VITALS — BP 124/74 | Ht 63.0 in | Wt 174.0 lb

## 2022-02-03 DIAGNOSIS — Z981 Arthrodesis status: Secondary | ICD-10-CM

## 2022-02-03 DIAGNOSIS — R300 Dysuria: Secondary | ICD-10-CM

## 2022-02-03 DIAGNOSIS — Z09 Encounter for follow-up examination after completed treatment for conditions other than malignant neoplasm: Secondary | ICD-10-CM

## 2022-02-03 DIAGNOSIS — G959 Disease of spinal cord, unspecified: Secondary | ICD-10-CM

## 2022-02-03 NOTE — Telephone Encounter (Signed)
Orders have been placed. Pt has been scheduled for a lab appt and a telephone visit next week.

## 2022-02-03 NOTE — Progress Notes (Signed)
   REFERRING PHYSICIAN:  Meade Maw, [address] [employer],  [place] [address]  DOS: 11/10/21 C3-7 ACDF   HISTORY OF PRESENT ILLNESS: AERICA RINCON is status post ACDF.  Is doing well.  Her numbness and neck pain have improved.  Her swallowing is improved.  Her walking is better.    PHYSICAL EXAMINATION:  NEUROLOGICAL:  General: In no acute distress.   Awake, alert, oriented to person, place, and time.  Pupils equal round and reactive to light.  Facial tone is symmetric.   Strength: Side Biceps Triceps Deltoid Interossei Grip Wrist Ext. Wrist Flex.  R '5 5 5 5 5 5 5  '$ L '5 5 5 5 5 5 5   '$ Incision c/d/I and healing well   Imaging:  Expected settling at C3-4  Assessment / Plan: BELLANY ELBAUM is doing well after C3-7 ACDF.   I am pleased with her progress.  She will continue physical therapy.   I will see her back in 6 months.  Meade Maw MD Dept of Neurosurgery

## 2022-02-03 NOTE — Telephone Encounter (Signed)
Patient thinks she has a UTI. Would like to come in to give a urine.She stating it feels funny when she urinates.  Burning , feels like it felt before. Patient unable to elaborate any more.

## 2022-02-04 ENCOUNTER — Telehealth: Payer: Self-pay

## 2022-02-04 LAB — URINALYSIS, ROUTINE W REFLEX MICROSCOPIC
Bilirubin Urine: NEGATIVE
Hgb urine dipstick: NEGATIVE
Ketones, ur: NEGATIVE
Nitrite: NEGATIVE
Specific Gravity, Urine: 1.02 (ref 1.000–1.030)
Total Protein, Urine: NEGATIVE
Urine Glucose: NEGATIVE
Urobilinogen, UA: 0.2 (ref 0.0–1.0)
pH: 5.5 (ref 5.0–8.0)

## 2022-02-04 NOTE — Telephone Encounter (Signed)
Pt was advised that we have not received the urine culture results yet. Pt was advised that since it is the weekend she may want to check with the pharmacy to make sure an antibiotic has not been sent in. Pt gave a verbal understanding.

## 2022-02-04 NOTE — Telephone Encounter (Signed)
Patient states she would like to know if we have received the results of her labs.  Patient states she would also like to know if we have any Trilogy inhaler (100) samples.  Patient states she is in a donut hole and cannot afford it.

## 2022-02-06 ENCOUNTER — Other Ambulatory Visit: Payer: Self-pay | Admitting: Internal Medicine

## 2022-02-06 ENCOUNTER — Encounter: Payer: Self-pay | Admitting: Internal Medicine

## 2022-02-06 DIAGNOSIS — R300 Dysuria: Secondary | ICD-10-CM | POA: Insufficient documentation

## 2022-02-06 LAB — URINE CULTURE
MICRO NUMBER:: 14042939
SPECIMEN QUALITY:: ADEQUATE

## 2022-02-06 MED ORDER — CIPROFLOXACIN HCL 250 MG PO TABS: 250.0000 mg | ORAL_TABLET | Freq: Two times a day (BID) | ORAL | 0 refills | Status: AC

## 2022-02-07 ENCOUNTER — Encounter: Payer: Self-pay | Admitting: Internal Medicine

## 2022-02-07 ENCOUNTER — Ambulatory Visit (INDEPENDENT_AMBULATORY_CARE_PROVIDER_SITE_OTHER): Payer: Medicare Other | Admitting: Internal Medicine

## 2022-02-07 DIAGNOSIS — N3090 Cystitis, unspecified without hematuria: Secondary | ICD-10-CM

## 2022-02-07 DIAGNOSIS — D126 Benign neoplasm of colon, unspecified: Secondary | ICD-10-CM

## 2022-02-07 DIAGNOSIS — M4802 Spinal stenosis, cervical region: Secondary | ICD-10-CM

## 2022-02-07 DIAGNOSIS — G959 Disease of spinal cord, unspecified: Secondary | ICD-10-CM

## 2022-02-07 MED ORDER — SOLIFENACIN SUCCINATE 5 MG PO TABS: 5.0000 mg | ORAL_TABLET | Freq: Every day | ORAL | 2 refills | Status: DC

## 2022-02-07 NOTE — Progress Notes (Unsigned)
Telephone  Note    This format is felt to be most appropriate for this patient at this time.  All issues noted in this document were discussed and addressed.  No physical exam was performed (except for noted visual exam findings with Video Visits).   I connected with Sarah [name]  on 02/07/22 at  4:45 PM EDT by telephone and verified that I am speaking with the correct person using two identifiers. Location patient: home Location provider: work or home office Persons participating in the virtual visit: patient, provider  I discussed the limitations, risks, security and privacy concerns of performing an evaluation and management service by telephone and the availability of in person appointments. I also discussed with the patient that there may be a patient responsible charge related to this service. The patient expressed understanding and agreed to proceed.  Reason for visit: urinary symptoms  HPI:  71 yr old female with OAB and history of UTI in August presents to follow up on recent recurrence of mild symptoms of suprapubic pressure and very mild dysuria that started several days ago .  Urinalysis was positive for 3-6 wbc's , no RBC's.  And culture grew <50K colonies of E coli pan sensitive .  UA in August was much more conclusive: [facility]. ,  > 100 K colonies)   2) OAB:  chronic, treated originally with Mybetriq,  changed to oxybutynin XL due to cost. Not effective at current dose of 10 mg.  Requesting change if possible. Symptoms preceded onset of infection   3) s/p cevical spine surgery:  s/p C3-7 ACDF in July by Dorothea Glassman.  She has been staying with friends since her surgery.  Her radiculitis has resolved.  She is planning to return home in a week or so.   ROS: See pertinent positives and negatives per HPI.  Past Medical History:  Diagnosis Date   Arthritis    Asthma    Depression    Dyspnea    with heat   Environmental and seasonal allergies    GERD (gastroesophageal  reflux disease)    Hypertension    Hypothyroidism    Presence of dental prosthetic device    Dental implants   Wheezing     Past Surgical History:  Procedure Laterality Date   ABDOMINAL HYSTERECTOMY  04/26/1987   ANTERIOR CERVICAL DECOMPRESSION/DISCECTOMY FUSION 4 LEVELS N/A 11/10/2021   Procedure: C3-7 ANTERIOR CERVICAL DISCECTOMY AND FUSION (GLOBUS FORGE);  Surgeon: Meade Maw, MD;  Location: [facility];  Service: Neurosurgery;  Laterality: N/A;   BROW LIFT Bilateral 02/07/2020   Procedure: BLEPHAROPLASTY UPPER EYELID; W/EXCESS SKIN BROW PTOSIS REPAIR BILATERAL;  Surgeon: Karle Starch, MD;  Location: [place];  Service: Ophthalmology;  Laterality: Bilateral;   CATARACT EXTRACTION W/PHACO Right 09/13/2016   Procedure: CATARACT EXTRACTION PHACO AND INTRAOCULAR LENS PLACEMENT (IOC);  Surgeon: Birder Robson, MD;  Location: [facility];  Service: Ophthalmology;  Laterality: Right;  [place] 00:38 AP% 14.3 CDE 5.51 Fluid pack lot # [id number]   CATARACT EXTRACTION W/PHACO Left 10/11/2016   Procedure: CATARACT EXTRACTION PHACO AND INTRAOCULAR LENS PLACEMENT (IOC);  Surgeon: Birder Robson, MD;  Location: [facility];  Service: Ophthalmology;  Laterality: Left;  [place] 00:30 AP% 11.3 CDE 3.50 fluid pack lot # [id number]   COLONOSCOPY WITH PROPOFOL N/A 09/30/2014   Procedure: COLONOSCOPY WITH PROPOFOL;  Surgeon: Robert Bellow, MD;  Location: [facility] ENDOSCOPY;  Service: Endoscopy;  Laterality: N/A;   COLONOSCOPY WITH PROPOFOL N/A 03/24/2021   Procedure: COLONOSCOPY WITH PROPOFOL;  Surgeon: Lin Landsman, MD;  Location: [facility] ENDOSCOPY;  Service: Gastroenterology;  Laterality: N/A;   ESOPHAGOGASTRODUODENOSCOPY N/A 09/30/2014   Procedure: ESOPHAGOGASTRODUODENOSCOPY (EGD);  Surgeon: Robert Bellow, MD;  Location: [facility] ENDOSCOPY;  Service: Endoscopy;  Laterality: N/A;   ESOPHAGOGASTRODUODENOSCOPY N/A 03/24/2021   Procedure: ESOPHAGOGASTRODUODENOSCOPY (EGD);  Surgeon: Lin Landsman, MD;  Location[facility] ENDOSCOPY;  Service: Gastroenterology;  Laterality: N/A;   FOOT SURGERY Bilateral 04/26/1983   KNEE SURGERY  04/25/2014    Family History  Problem Relation Age of Onset   Diabetes Mother    Hyperlipidemia Mother    Heart disease Mother    Prostate cancer Neg Hx    Kidney cancer Neg Hx    Bladder Cancer Neg Hx     SOCIAL HX:  reports that she has never smoked. She has never used smokeless tobacco. She reports that she does not drink alcohol and does not use drugs.    Current Outpatient Medications:    albuterol (VENTOLIN HFA) 108 (90 Base) MCG/ACT inhaler, Inhale 2 puffs into the lungs every 6 (six) hours as needed for wheezing or shortness of breath., Disp: 3 each, Rfl: 3   amLODipine (NORVASC) 5 MG tablet, Take 1 tablet (5 mg total) by mouth daily., Disp: 90 tablet, Rfl: 3   budesonide (PULMICORT) 0.25 MG/2ML nebulizer solution, Take 2 mLs (0.25 mg total) by nebulization 2 (two) times daily., Disp: 60 mL, Rfl: 12   Calcium Citrate-Vitamin D (CALCIUM CITRATE + D PO), Take 1 tablet by mouth 2 (two) times daily., Disp: , Rfl:    citalopram (CELEXA) 20 MG tablet, Take 1 tablet (20 mg total) by mouth daily., Disp: 90 tablet, Rfl: 3   famotidine (PEPCID) 20 MG tablet, TAKE 1 TABLET BY MOUTH DAILY. BEFORE DINNER, Disp: 30 tablet, Rfl: 11   fexofenadine (ALLEGRA) 180 MG tablet, Take 180 mg by mouth daily., Disp: , Rfl:    Fluticasone-Umeclidin-Vilant (TRELEGY ELLIPTA) 100-62.5-25 MCG/ACT AEPB, Inhale 1 puff into the lungs daily., Disp: 1 each, Rfl: 11   gabapentin (NEURONTIN) 100 MG capsule, Take 1 capsule (100 mg total) by mouth 3 (three) times daily. (Patient taking differently: Take 100 mg by mouth daily.), Disp: 270 capsule, Rfl: 3   ipratropium-albuterol (DUONEB) 0.5-2.5 (3) MG/3ML SOLN, Take 3 mLs by nebulization every 6 (six) hours as needed., Disp: 360 mL, Rfl: 2   levothyroxine (SYNTHROID) 50 MCG tablet, TAKE 1 TABLET EVERY DAY ON EMPTY STOMACHWITH A GLASS OF  WATER AT LEAST 30-60 MINBEFORE BREAKFAST, Disp: 90 tablet, Rfl: 3   oxybutynin (DITROPAN-XL) 10 MG 24 hr tablet, TAKE 1 TABLET BY MOUTH AT BEDTIME., Disp: 30 tablet, Rfl: 2   pantoprazole (PROTONIX) 40 MG tablet, Take 1 tablet (40 mg total) by mouth daily., Disp: 90 tablet, Rfl: 3   rosuvastatin (CRESTOR) 10 MG tablet, Take 1 tablet (10 mg total) by mouth daily., Disp: 90 tablet, Rfl: 3   solifenacin (VESICARE) 5 MG tablet, Take 1 tablet (5 mg total) by mouth daily., Disp: 30 tablet, Rfl: 2   traZODone (DESYREL) 50 MG tablet, TAKE 1/2 TO 1 TABLET BY MOUTH AT BEDTIME AS NEEDED FOR SLEEP, Disp: 90 tablet, Rfl: 3   vitamin B-12 (CYANOCOBALAMIN) 1000 MCG tablet, Take 1,000 mcg by mouth daily., Disp: , Rfl:    ciprofloxacin (CIPRO) 250 MG tablet, Take 1 tablet (250 mg total) by mouth 2 (two) times daily for 3 days. (Patient not taking: Reported on 02/07/2022), Disp: 6 tablet, Rfl: 0  EXAM:   General impression: alert,  cooperative and articulate.  No signs of being in distress  Lungs: speech is fluent sentence length suggests that patient is not short of breath and not punctuated by cough, sneezing or sniffing. Marland Kitchen   Psych: affect normal.  speech is articulate and non pressured .  Denies suicidal thoughts    ASSESSMENT AND PLAN:  Discussed the following assessment and plan:  Tubular adenoma of colon  Cervical myelopathy ([place])  Cystitis  Spinal stenosis, cervical region  Cystitis Symptoms are currently mild and UA/micro/culture equivocal.  Recommend treating to resolution with cipro .Daily use of a probiotic advised for 3 weeks.   Spinal stenosis, cervical region doing well after C3-7 ACDF in July by Dr Izora Ribas.      I discussed the assessment and treatment plan with the patient. The patient was provided an opportunity to ask questions and all were answered. The patient agreed with the plan and demonstrated an understanding of the instructions.   The patient was advised to call back or  seek an in-person evaluation if the symptoms worsen or if the condition fails to improve as anticipated.   I spent 20 minutes dedicated to the care of this patient on the date of this encounter to include pre-visit review of her recent surgery,   recent labs and urine cultures  non face-to-face time with the patient , and post visit ordering of testing and therapeutics.    Crecencio Mc, MD

## 2022-02-08 ENCOUNTER — Other Ambulatory Visit: Payer: Self-pay | Admitting: Internal Medicine

## 2022-02-08 NOTE — Assessment & Plan Note (Signed)
Symptoms are currently mild and UA/micro/culture equivocal.  Recommend treating to resolution with cipro .Daily use of a probiotic advised for 3 weeks.

## 2022-02-08 NOTE — Assessment & Plan Note (Signed)
doing well after C3-7 ACDF in July by Dr Izora Ribas.

## 2022-02-11 DIAGNOSIS — M5412 Radiculopathy, cervical region: Secondary | ICD-10-CM | POA: Diagnosis not present

## 2022-02-11 DIAGNOSIS — M4322 Fusion of spine, cervical region: Secondary | ICD-10-CM | POA: Diagnosis not present

## 2022-02-11 DIAGNOSIS — M542 Cervicalgia: Secondary | ICD-10-CM | POA: Diagnosis not present

## 2022-02-11 DIAGNOSIS — M5451 Vertebrogenic low back pain: Secondary | ICD-10-CM | POA: Diagnosis not present

## 2022-02-15 DIAGNOSIS — M5412 Radiculopathy, cervical region: Secondary | ICD-10-CM | POA: Diagnosis not present

## 2022-02-15 DIAGNOSIS — M5451 Vertebrogenic low back pain: Secondary | ICD-10-CM | POA: Diagnosis not present

## 2022-02-15 DIAGNOSIS — M542 Cervicalgia: Secondary | ICD-10-CM | POA: Diagnosis not present

## 2022-02-15 DIAGNOSIS — M4322 Fusion of spine, cervical region: Secondary | ICD-10-CM | POA: Diagnosis not present

## 2022-02-18 DIAGNOSIS — M5412 Radiculopathy, cervical region: Secondary | ICD-10-CM | POA: Diagnosis not present

## 2022-02-18 DIAGNOSIS — M4322 Fusion of spine, cervical region: Secondary | ICD-10-CM | POA: Diagnosis not present

## 2022-02-18 DIAGNOSIS — M542 Cervicalgia: Secondary | ICD-10-CM | POA: Diagnosis not present

## 2022-02-18 DIAGNOSIS — M5451 Vertebrogenic low back pain: Secondary | ICD-10-CM | POA: Diagnosis not present

## 2022-02-23 DIAGNOSIS — M542 Cervicalgia: Secondary | ICD-10-CM | POA: Diagnosis not present

## 2022-02-23 DIAGNOSIS — M5451 Vertebrogenic low back pain: Secondary | ICD-10-CM | POA: Diagnosis not present

## 2022-02-23 DIAGNOSIS — M5412 Radiculopathy, cervical region: Secondary | ICD-10-CM | POA: Diagnosis not present

## 2022-02-23 DIAGNOSIS — M4322 Fusion of spine, cervical region: Secondary | ICD-10-CM | POA: Diagnosis not present

## 2022-02-25 DIAGNOSIS — M5412 Radiculopathy, cervical region: Secondary | ICD-10-CM | POA: Diagnosis not present

## 2022-02-25 DIAGNOSIS — M4322 Fusion of spine, cervical region: Secondary | ICD-10-CM | POA: Diagnosis not present

## 2022-02-25 DIAGNOSIS — M542 Cervicalgia: Secondary | ICD-10-CM | POA: Diagnosis not present

## 2022-02-25 DIAGNOSIS — M5451 Vertebrogenic low back pain: Secondary | ICD-10-CM | POA: Diagnosis not present

## 2022-02-28 ENCOUNTER — Inpatient Hospital Stay: Payer: Medicare Other

## 2022-02-28 ENCOUNTER — Inpatient Hospital Stay: Payer: Medicare Other | Admitting: Internal Medicine

## 2022-03-02 DIAGNOSIS — M5451 Vertebrogenic low back pain: Secondary | ICD-10-CM | POA: Diagnosis not present

## 2022-03-02 DIAGNOSIS — M542 Cervicalgia: Secondary | ICD-10-CM | POA: Diagnosis not present

## 2022-03-02 DIAGNOSIS — M4322 Fusion of spine, cervical region: Secondary | ICD-10-CM | POA: Diagnosis not present

## 2022-03-02 DIAGNOSIS — M5412 Radiculopathy, cervical region: Secondary | ICD-10-CM | POA: Diagnosis not present

## 2022-03-03 ENCOUNTER — Telehealth: Payer: Self-pay

## 2022-03-03 ENCOUNTER — Other Ambulatory Visit: Payer: Self-pay | Admitting: Internal Medicine

## 2022-03-03 NOTE — Telephone Encounter (Signed)
Pt requesting samples of Fluticasone-Umeclidin-Vilant (TRELEGY ELLIPTA) 100-62.5-25 MCG/ACT AEPB she is currently in a donut hole & cannot afford med at this time. Pt requesting callback...Marland KitchenMarland Kitchen

## 2022-03-03 NOTE — Telephone Encounter (Signed)
Spoke with pt and informed her that we do not have any samples in the office at this time.

## 2022-03-04 DIAGNOSIS — M4322 Fusion of spine, cervical region: Secondary | ICD-10-CM | POA: Diagnosis not present

## 2022-03-04 DIAGNOSIS — M5451 Vertebrogenic low back pain: Secondary | ICD-10-CM | POA: Diagnosis not present

## 2022-03-04 DIAGNOSIS — M542 Cervicalgia: Secondary | ICD-10-CM | POA: Diagnosis not present

## 2022-03-04 DIAGNOSIS — M5412 Radiculopathy, cervical region: Secondary | ICD-10-CM | POA: Diagnosis not present

## 2022-03-09 DIAGNOSIS — M5412 Radiculopathy, cervical region: Secondary | ICD-10-CM | POA: Diagnosis not present

## 2022-03-09 DIAGNOSIS — M5451 Vertebrogenic low back pain: Secondary | ICD-10-CM | POA: Diagnosis not present

## 2022-03-09 DIAGNOSIS — M542 Cervicalgia: Secondary | ICD-10-CM | POA: Diagnosis not present

## 2022-03-09 DIAGNOSIS — M4322 Fusion of spine, cervical region: Secondary | ICD-10-CM | POA: Diagnosis not present

## 2022-03-13 ENCOUNTER — Other Ambulatory Visit: Payer: Self-pay | Admitting: Internal Medicine

## 2022-03-13 DIAGNOSIS — I10 Essential (primary) hypertension: Secondary | ICD-10-CM

## 2022-03-14 DIAGNOSIS — M5412 Radiculopathy, cervical region: Secondary | ICD-10-CM | POA: Diagnosis not present

## 2022-03-14 DIAGNOSIS — M5451 Vertebrogenic low back pain: Secondary | ICD-10-CM | POA: Diagnosis not present

## 2022-03-14 DIAGNOSIS — M542 Cervicalgia: Secondary | ICD-10-CM | POA: Diagnosis not present

## 2022-03-14 DIAGNOSIS — M4322 Fusion of spine, cervical region: Secondary | ICD-10-CM | POA: Diagnosis not present

## 2022-03-15 ENCOUNTER — Telehealth: Payer: Self-pay

## 2022-03-15 NOTE — Telephone Encounter (Signed)
Medication Samples have been provided to the patient.  Drug name: Trelegy       Strength: 174mg        Qty: 2 boxes  LOT: [id number]  Exp.Date: 07/25/2023  Dosing instructions: Inhale 1 puff into lungs once daily.   The patient has been instructed regarding the correct time, dose, and frequency of taking this medication, including desired effects and most common side effects.   Sarah [name] 3:51 PM 03/15/2022

## 2022-03-18 ENCOUNTER — Other Ambulatory Visit: Payer: Self-pay | Admitting: Internal Medicine

## 2022-03-18 DIAGNOSIS — E785 Hyperlipidemia, unspecified: Secondary | ICD-10-CM

## 2022-03-23 ENCOUNTER — Inpatient Hospital Stay (HOSPITAL_BASED_OUTPATIENT_CLINIC_OR_DEPARTMENT_OTHER): Payer: Medicare Other | Admitting: Internal Medicine

## 2022-03-23 ENCOUNTER — Encounter: Payer: Self-pay | Admitting: Internal Medicine

## 2022-03-23 ENCOUNTER — Inpatient Hospital Stay: Payer: Medicare Other | Attending: Internal Medicine

## 2022-03-23 VITALS — BP 112/68 | HR 75 | Temp 96.6°F | Resp 18 | Wt 173.6 lb

## 2022-03-23 DIAGNOSIS — J45909 Unspecified asthma, uncomplicated: Secondary | ICD-10-CM | POA: Diagnosis not present

## 2022-03-23 DIAGNOSIS — Z8249 Family history of ischemic heart disease and other diseases of the circulatory system: Secondary | ICD-10-CM | POA: Insufficient documentation

## 2022-03-23 DIAGNOSIS — D649 Anemia, unspecified: Secondary | ICD-10-CM | POA: Diagnosis not present

## 2022-03-23 DIAGNOSIS — R5383 Other fatigue: Secondary | ICD-10-CM | POA: Insufficient documentation

## 2022-03-23 DIAGNOSIS — M2550 Pain in unspecified joint: Secondary | ICD-10-CM | POA: Diagnosis not present

## 2022-03-23 DIAGNOSIS — Z833 Family history of diabetes mellitus: Secondary | ICD-10-CM | POA: Diagnosis not present

## 2022-03-23 DIAGNOSIS — Z79899 Other long term (current) drug therapy: Secondary | ICD-10-CM | POA: Diagnosis not present

## 2022-03-23 DIAGNOSIS — I10 Essential (primary) hypertension: Secondary | ICD-10-CM | POA: Insufficient documentation

## 2022-03-23 DIAGNOSIS — D539 Nutritional anemia, unspecified: Secondary | ICD-10-CM | POA: Diagnosis not present

## 2022-03-23 DIAGNOSIS — Z5986 Financial insecurity: Secondary | ICD-10-CM | POA: Diagnosis not present

## 2022-03-23 DIAGNOSIS — Z8349 Family history of other endocrine, nutritional and metabolic diseases: Secondary | ICD-10-CM | POA: Diagnosis not present

## 2022-03-23 LAB — CBC WITH DIFFERENTIAL/PLATELET
Abs Immature Granulocytes: 0.06 10*3/uL (ref 0.00–0.07)
Basophils Absolute: 0.1 10*3/uL (ref 0.0–0.1)
Basophils Relative: 2 %
Eosinophils Absolute: 0.2 10*3/uL (ref 0.0–0.5)
Eosinophils Relative: 3 %
HCT: 33.1 % — ABNORMAL LOW (ref 36.0–46.0)
Hemoglobin: 10.9 g/dL — ABNORMAL LOW (ref 12.0–15.0)
Immature Granulocytes: 1 %
Lymphocytes Relative: 26 %
Lymphs Abs: 1.6 10*3/uL (ref 0.7–4.0)
MCH: 35 pg — ABNORMAL HIGH (ref 26.0–34.0)
MCHC: 32.9 g/dL (ref 30.0–36.0)
MCV: 106.4 fL — ABNORMAL HIGH (ref 80.0–100.0)
Monocytes Absolute: 0.5 10*3/uL (ref 0.1–1.0)
Monocytes Relative: 8 %
Neutro Abs: 3.8 10*3/uL (ref 1.7–7.7)
Neutrophils Relative %: 60 %
Platelets: 136 10*3/uL — ABNORMAL LOW (ref 150–400)
RBC: 3.11 MIL/uL — ABNORMAL LOW (ref 3.87–5.11)
RDW: 14 % (ref 11.5–15.5)
Smear Review: DECREASED
WBC: 6.2 10*3/uL (ref 4.0–10.5)
nRBC: 0 % (ref 0.0–0.2)

## 2022-03-23 LAB — COMPREHENSIVE METABOLIC PANEL
ALT: 11 U/L (ref 0–44)
AST: 11 U/L — ABNORMAL LOW (ref 15–41)
Albumin: 4 g/dL (ref 3.5–5.0)
Alkaline Phosphatase: 58 U/L (ref 38–126)
Anion gap: 6 (ref 5–15)
BUN: 18 mg/dL (ref 8–23)
CO2: 29 mmol/L (ref 22–32)
Calcium: 8.8 mg/dL — ABNORMAL LOW (ref 8.9–10.3)
Chloride: 106 mmol/L (ref 98–111)
Creatinine, Ser: 0.78 mg/dL (ref 0.44–1.00)
GFR, Estimated: >60 mL/min (ref >60)
Glucose, Bld: 106 mg/dL — ABNORMAL HIGH (ref 70–99)
Potassium: 3.8 mmol/L (ref 3.5–5.1)
Sodium: 141 mmol/L (ref 135–145)
Total Bilirubin: 0.3 mg/dL (ref 0.3–1.2)
Total Protein: 7.1 g/dL (ref 6.5–8.1)

## 2022-03-23 LAB — LACTATE DEHYDROGENASE: LDH: 124 U/L (ref 98–192)

## 2022-03-23 NOTE — Progress Notes (Signed)
Patient had neck surgery on 11/10/21.    Patient denies new problems/concerns today.

## 2022-03-23 NOTE — Progress Notes (Signed)
[place] NOTE  Patient Care Team: Crecencio Mc, MD as PCP - General (Internal Medicine) Crecencio Mc, MD (Internal Medicine) Bary Castilla, Forest Gleason, MD (General Surgery) Cammie Sickle, MD as Consulting Physician (Hematology and Oncology)  CHIEF COMPLAINTS/PURPOSE OF CONSULTATION: ANEMIA  HEMATOLOGY HISTORY:  # MACROCYTIC ANEMIA [since 2022- April hb 11; MCV-103; platelet- 120s] EGD/ colonoscopy >>> 10 years ago; OCT 2022-slight improvement blood counts/stable; hepatitis HIV negative.  Myeloma panel negative.  HOLD off  bone marrow biopsy;  #Incidental right hepatic lobe 13 mm hypoechoic lesion [ultrasound-October 2022]-MRI OCT 2022-   Low clinical concern for metastatic disease; however need to rule out primary [place] versus others.  1. There are 3 small liver lesions, 2 of which represent simple cysts. 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 MAY 2023-abdominal MRI -benign cyst no further surveillance recommended.   #October 2022-ultrasound incidental bilateral mild to moderate hydronephrosis.-Rs/p evaluation with Dr.Brandon urology.   HISTORY OF PRESENTING ILLNESS: Ambulating independently.  With friend.   Sarah [name] 71 y.o.  female is here for follow-up today anemia/thrombocytopenia is here for a follow up.   In the interim patient underwent neck surgery.  Healing well.  No complications.   Denies any blood in stools or black-colored stools.  Patient denies any nausea vomiting.  Chronic mild fatigue.  Chronic mild easy bruising.  No tremors.  Review of Systems  Constitutional:  Positive for malaise/fatigue. Negative for chills, diaphoresis, fever and weight loss.  HENT:  Negative for nosebleeds and sore throat.   Eyes:  Negative for double vision.  Respiratory:  Negative for cough, hemoptysis, sputum production, shortness of breath and wheezing.   Cardiovascular:   Negative for chest pain, palpitations, orthopnea and leg swelling.  Gastrointestinal:  Negative for abdominal pain, blood in stool, constipation, diarrhea, heartburn, melena, nausea and vomiting.  Genitourinary:  Negative for dysuria, frequency and urgency.  Musculoskeletal:  Positive for joint pain. Negative for back pain.  Skin: Negative.  Negative for itching and rash.  Neurological:  Negative for dizziness, tingling, focal weakness, weakness and headaches.  Endo/Heme/Allergies:  Bruises/bleeds easily.  Psychiatric/Behavioral:  Negative for depression. The patient is not nervous/anxious and does not have insomnia.     MEDICAL HISTORY:  Past Medical History:  Diagnosis Date   Arthritis    Asthma    Depression    Dyspnea    with heat   Environmental and seasonal allergies    GERD (gastroesophageal reflux disease)    Hypertension    Hypothyroidism    Presence of dental prosthetic device    Dental implants   Wheezing     SURGICAL HISTORY: Past Surgical History:  Procedure Laterality Date   ABDOMINAL HYSTERECTOMY  04/26/1987   ANTERIOR CERVICAL DECOMPRESSION/DISCECTOMY FUSION 4 LEVELS N/A 11/10/2021   Procedure: C3-7 ANTERIOR CERVICAL DISCECTOMY AND FUSION (GLOBUS FORGE);  Surgeon: Meade Maw, MD;  Location: [facility];  Service: Neurosurgery;  Laterality: N/A;   BROW LIFT Bilateral 02/07/2020   Procedure: BLEPHAROPLASTY UPPER EYELID; W/EXCESS SKIN BROW PTOSIS REPAIR BILATERAL;  Surgeon: Karle Starch, MD;  Location: [place];  Service: Ophthalmology;  Laterality: Bilateral;   CATARACT EXTRACTION W/PHACO Right 09/13/2016   Procedure: CATARACT EXTRACTION PHACO AND INTRAOCULAR LENS PLACEMENT (IOC);  Surgeon: Birder Robson, MD;  Location: [facility];  Service: Ophthalmology;  Laterality: Right;  [place] 00:38 AP% 14.3 CDE 5.51 Fluid pack lot # [id number]   CATARACT EXTRACTION W/PHACO  Left 10/11/2016   Procedure: CATARACT EXTRACTION PHACO AND INTRAOCULAR LENS PLACEMENT  (IOC);  Surgeon: Birder Robson, MD;  Location: [facility];  Service: Ophthalmology;  Laterality: Left;  [place] 00:30 AP% 11.3 CDE 3.50 fluid pack lot # [id number]   COLONOSCOPY WITH PROPOFOL N/A 09/30/2014   Procedure: COLONOSCOPY WITH PROPOFOL;  Surgeon: Robert Bellow, MD;  Location: [facility] ENDOSCOPY;  Service: Endoscopy;  Laterality: N/A;   COLONOSCOPY WITH PROPOFOL N/A 03/24/2021   Procedure: COLONOSCOPY WITH PROPOFOL;  Surgeon: Lin Landsman, MD;  Location: [facility] ENDOSCOPY;  Service: Gastroenterology;  Laterality: N/A;   ESOPHAGOGASTRODUODENOSCOPY N/A 09/30/2014   Procedure: ESOPHAGOGASTRODUODENOSCOPY (EGD);  Surgeon: Robert Bellow, MD;  Location: [facility] ENDOSCOPY;  Service: Endoscopy;  Laterality: N/A;   ESOPHAGOGASTRODUODENOSCOPY N/A 03/24/2021   Procedure: ESOPHAGOGASTRODUODENOSCOPY (EGD);  Surgeon: Lin Landsman, MD;  Location: [facility] ENDOSCOPY;  Service: Gastroenterology;  Laterality: N/A;   FOOT SURGERY Bilateral 04/26/1983   KNEE SURGERY  04/25/2014    SOCIAL HISTORY: Social History   Socioeconomic History   Marital status: Single    Spouse name: Not on file   Number of children: Not on file   Years of education: Not on file   Highest education level: Not on file  Occupational History   Not on file  Tobacco Use   Smoking status: Never   Smokeless tobacco: Never  Vaping Use   Vaping Use: Never used  Substance and Sexual Activity   Alcohol use: No   Drug use: No   Sexual activity: Never  Other Topics Concern   Not on file  Social History Narrative   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 No smoking; no alcohol. Lives in [place], [place] lives self/kitty.    Social Determinants of Health   Financial Resource Strain: Medium Risk (06/18/2021)   Overall Financial Resource Strain (CARDIA)    Difficulty of Paying Living Expenses: Somewhat hard  Food Insecurity: No Food Insecurity (04/14/2021)   Hunger Vital Sign    Worried About Running Out of Food in the  Last Year: Never true    Ran Out of Food in the Last Year: Never true  Transportation Needs: No Transportation Needs (04/14/2021)   PRAPARE - Hydrologist (Medical): No    Lack of Transportation (Non-Medical): No  Physical Activity: Unknown (04/13/2020)   Exercise Vital Sign    Days of Exercise per Week: 0 days    Minutes of Exercise per Session: Not on file  Stress: No Stress Concern Present (04/14/2021)   [place]    Feeling of Stress : Not at all  Social Connections: Unknown (04/14/2021)   Social Connection and Isolation Panel [NHANES]    Frequency of Communication with Friends and Family: More than three times a week    Frequency of Social Gatherings with Friends and Family: More than three times a week    Attends Religious Services: More than 4 times per year    Active Member of [employer] or Organizations: Yes    Attends Archivist Meetings: More than 4 times per year    Marital Status: Not on file  Intimate Partner Violence: Not At Risk (04/14/2021)   Humiliation, Afraid, Rape, and Kick questionnaire    Fear of Current or Ex-Partner: No    Emotionally Abused: No    Physically Abused: No    Sexually Abused: No    FAMILY HISTORY: Family History  Problem Relation Age of Onset  Diabetes Mother    Hyperlipidemia Mother    Heart disease Mother    Prostate cancer Neg Hx    Kidney cancer Neg Hx    Bladder Cancer Neg Hx     ALLERGIES:  has No Known Allergies.  MEDICATIONS:  Current Outpatient Medications  Medication Sig Dispense Refill   albuterol (VENTOLIN HFA) 108 (90 Base) MCG/ACT inhaler Inhale 2 puffs into the lungs every 6 (six) hours as needed for wheezing or shortness of breath. 3 each 3   amLODipine (NORVASC) 5 MG tablet TAKE 1 TABLET BY MOUTH DAILY 100 tablet 2   budesonide (PULMICORT) 0.25 MG/2ML nebulizer solution Take 2 mLs (0.25 mg total) by nebulization 2  (two) times daily. 60 mL 12   Calcium Citrate-Vitamin D (CALCIUM CITRATE + D PO) Take 1 tablet by mouth 2 (two) times daily.     citalopram (CELEXA) 20 MG tablet TAKE 1 TABLET BY MOUTH DAILY 90 tablet 3   famotidine (PEPCID) 20 MG tablet TAKE 1 TABLET BY MOUTH DAILY. BEFORE DINNER 30 tablet 11   fexofenadine (ALLEGRA) 180 MG tablet Take 180 mg by mouth daily.     Fluticasone-Umeclidin-Vilant (TRELEGY ELLIPTA) 100-62.5-25 MCG/ACT AEPB Inhale 1 puff into the lungs daily. 1 each 11   gabapentin (NEURONTIN) 100 MG capsule TAKE 1 CAPSULE BY MOUTH 3 TIMES  DAILY 300 capsule 2   ipratropium-albuterol (DUONEB) 0.5-2.5 (3) MG/3ML SOLN Take 3 mLs by nebulization every 6 (six) hours as needed. 360 mL 2   levothyroxine (SYNTHROID) 50 MCG tablet TAKE 1 TABLET BY MOUTH DAILY ON  EMPTY STOMACH WITH A GLASS OF  WATER AT LEAST 30 TO 60 MIN  BEFORE BREAKFAST 100 tablet 2   oxybutynin (DITROPAN-XL) 10 MG 24 hr tablet TAKE 1 TABLET BY MOUTH AT BEDTIME. 30 tablet 2   pantoprazole (PROTONIX) 40 MG tablet TAKE 1 TABLET BY MOUTH DAILY 100 tablet 2   rosuvastatin (CRESTOR) 10 MG tablet TAKE 1 TABLET BY MOUTH DAILY 100 tablet 2   solifenacin (VESICARE) 5 MG tablet Take 1 tablet (5 mg total) by mouth daily. 30 tablet 2   traZODone (DESYREL) 50 MG tablet TAKE 1/2 TO 1 TABLET BY MOUTH AT BEDTIME AS NEEDED FOR SLEEP 90 tablet 3   vitamin B-12 (CYANOCOBALAMIN) 1000 MCG tablet Take 1,000 mcg by mouth daily.     No current facility-administered medications for this visit.      PHYSICAL EXAMINATION:   Vitals:   03/23/22 1500  BP: 112/68  Pulse: 75  Resp: 18  Temp: (!) 96.6 F (35.9 C)    Filed Weights   03/23/22 1500  Weight: 173 lb 9.6 oz (78.7 kg)     Physical Exam Vitals and nursing note reviewed.  HENT:     Head: Normocephalic and atraumatic.     Mouth/Throat:     Pharynx: Oropharynx is clear.  Eyes:     Extraocular Movements: Extraocular movements intact.     Pupils: Pupils are equal, round, and  reactive to light.  Cardiovascular:     Rate and Rhythm: Normal rate and regular rhythm.  Pulmonary:     Comments: Decreased breath sounds bilaterally.  Abdominal:     Palpations: Abdomen is soft.  Musculoskeletal:        General: Normal range of motion.     Cervical back: Normal range of motion.  Skin:    General: Skin is warm.  Neurological:     General: No focal deficit present.     Mental Status: She  is alert and oriented to person, place, and time.  Psychiatric:        Behavior: Behavior normal.        Judgment: Judgment normal.     LABORATORY DATA:  I have reviewed the data as listed Lab Results  Component Value Date   WBC 6.2 03/23/2022   HGB 10.9 (L) 03/23/2022   HCT 33.1 (L) 03/23/2022   MCV 106.4 (H) 03/23/2022   PLT 136 (L) 03/23/2022   Recent Labs    06/16/21 0923 08/06/21 1612 08/27/21 1032 09/15/21 0758 03/23/22 1434  NA 141   < > 136 140 141  K 3.9   < > 3.8 3.9 3.8  CL 101   < > 104 103 106  CO2 32   < > _0 GLUCOSE 113*   < > 113* 105* 106*  BUN 17   < > _1 CREATININE 0.77   < > 0.80 0.92 0.78  CALCIUM 9.4   < > 9.0 9.6 8.8*  GFRNONAA >60  --  >60  --  >60  PROT 7.2  --  7.1 6.8 7.1  ALBUMIN 4.0  --  4.1 4.2 4.0  AST 15  --  15 36 11*  ALT 17  --  _2 ALKPHOS 67  --  71 73 58  BILITOT 0.5  --  0.5 0.5 0.3   < > = values in this interval not displayed.     No results found.  Macrocytic anemia #[FE 2023] Mild macrocytic anemia/thrombocytopenia-hemoglobin around 11-12; platelets 127-140s.  Normal white count.  Today hemoglobin is 10.9 platelets 160.  #Overall anemia/thrombocytopenia intermittent/-slightly worsened.  Discussed regarding concerns for primary bone marrow disorder like MDS.  Discussed regarding a bone marrow biopsy. Given the options of waiting 3 months for the bone marrow versus now.  Patient was to proceed with bone marrow biopsy now.  Discussed with the patient the bone marrow biopsy and aspiration  indication and procedure at length.  Given significant discomfort involved-I would recommend under anesthesia/with radiology in the hospital. I discussed the potential complications include-bleeding/trauma and risk of infection; which are fortunately very rare.  Patient is in agreement. Patient will sign the consent prior to the procedure. Bone marrow biopsy/aspiration is ordered.   # Incidental cysts in liver/US [OCT 2022-]: 13 mm hypoechoic mass within the liver-surveillance MRI May 2023- Stable small hepatic hemangioma and small hepatic cysts; Parapelvic renal cysts; iny hiatal hernia-given the overall stability of the cysts these would be considered benign. Stable.    # DISPOSITION:  # bone marrow Biopsy- in 2 weeks # Follow up in  4 weeks- MD; No labs- Dr.B        All questions were answered. The patient knows to call the clinic with any problems, questions or concerns.      Cammie Sickle, MD 03/23/2022 3:55 PM

## 2022-03-23 NOTE — Assessment & Plan Note (Addendum)
#[  FE 2023] Mild macrocytic anemia/thrombocytopenia-hemoglobin around 11-12; platelets 127-140s.  Normal white count.  Today hemoglobin is 10.9 platelets 160.  #Overall anemia/thrombocytopenia intermittent/-slightly worsened.  Discussed regarding concerns for primary bone marrow disorder like MDS.  Discussed regarding a bone marrow biopsy. Given the options of waiting 3 months for the bone marrow versus now.  Patient was to proceed with bone marrow biopsy now.  Discussed with the patient the bone marrow biopsy and aspiration indication and procedure at length.  Given significant discomfort involved-I would recommend under anesthesia/with radiology in the hospital. I discussed the potential complications include-bleeding/trauma and risk of infection; which are fortunately very rare.  Patient is in agreement. Patient will sign the consent prior to the procedure. Bone marrow biopsy/aspiration is ordered.   # Incidental cysts in liver/US [OCT 2022-]: 13 mm hypoechoic mass within the liver-surveillance MRI May 2023- Stable small hepatic hemangioma and small hepatic cysts; Parapelvic renal cysts; iny hiatal hernia-given the overall stability of the cysts these would be considered benign. Stable.    # DISPOSITION:  # bone marrow Biopsy- in 2 weeks # Follow up in  4 weeks- MD; No labs- Dr.B

## 2022-03-24 ENCOUNTER — Telehealth: Payer: Self-pay | Admitting: *Deleted

## 2022-03-24 NOTE — Telephone Encounter (Signed)
Call placed to patient to confirm appointment time and details for bone marrow biopsy. Patient is scheduled for Friday 12/8 at 9:30 am, patient to arrive in medical mall at 8:30 am. Patient aware that she will need transportation to and from appointment. Patient verbalized understanding and is in agreement with this plan. She will call back if there are any issues with transportation.

## 2022-03-25 DIAGNOSIS — M542 Cervicalgia: Secondary | ICD-10-CM | POA: Diagnosis not present

## 2022-03-25 DIAGNOSIS — M5451 Vertebrogenic low back pain: Secondary | ICD-10-CM | POA: Diagnosis not present

## 2022-03-25 DIAGNOSIS — M5412 Radiculopathy, cervical region: Secondary | ICD-10-CM | POA: Diagnosis not present

## 2022-03-25 DIAGNOSIS — M4322 Fusion of spine, cervical region: Secondary | ICD-10-CM | POA: Diagnosis not present

## 2022-03-31 ENCOUNTER — Other Ambulatory Visit: Payer: Self-pay | Admitting: Radiology

## 2022-03-31 DIAGNOSIS — Z01812 Encounter for preprocedural laboratory examination: Secondary | ICD-10-CM

## 2022-03-31 DIAGNOSIS — M4322 Fusion of spine, cervical region: Secondary | ICD-10-CM | POA: Diagnosis not present

## 2022-03-31 DIAGNOSIS — M5412 Radiculopathy, cervical region: Secondary | ICD-10-CM | POA: Diagnosis not present

## 2022-03-31 DIAGNOSIS — M5451 Vertebrogenic low back pain: Secondary | ICD-10-CM | POA: Diagnosis not present

## 2022-03-31 DIAGNOSIS — M542 Cervicalgia: Secondary | ICD-10-CM | POA: Diagnosis not present

## 2022-03-31 NOTE — Progress Notes (Signed)
Patient for Bone Marrow Biopsy on Friday 04/01/2022, I called and spoke with the patient on the phone and gave pre-procedure instructions. Pt was made aware to be here at 8:30a at the new entrance, NPO after MN prior to procedure as well as driver post procedure/recovery/discharge. Pt stated understanding. Called 03/31/2022

## 2022-03-31 NOTE — H&P (Signed)
Chief Complaint: Patient was seen in consultation today for bone marrow biopsy with aspiration.   Referring Physician(s): Cammie Sickle  Supervising Physician: Michaelle Birks  Patient Status: [facility] - Out-pt  History of Present Illness: Sarah [name] is a 71 y.o. female with a medical history significant for macrocytic anemia/thrombocytopenia, diagnosed April 2022. Recent lab work shows worsening numbers and there is concern for a primary bone disorder like myelodysplastic syndrome.  Interventional Radiology has been asked to evaluate this patient for an image-guided bone marrow biopsy with aspiration for further work up.   Past Medical History:  Diagnosis Date   Arthritis    Asthma    Depression    Dyspnea    with heat   Environmental and seasonal allergies    GERD (gastroesophageal reflux disease)    Hypertension    Hypothyroidism    Presence of dental prosthetic device    Dental implants   Wheezing     Past Surgical History:  Procedure Laterality Date   ABDOMINAL HYSTERECTOMY  04/26/1987   ANTERIOR CERVICAL DECOMPRESSION/DISCECTOMY FUSION 4 LEVELS N/A 11/10/2021   Procedure: C3-7 ANTERIOR CERVICAL DISCECTOMY AND FUSION (GLOBUS FORGE);  Surgeon: Meade Maw, MD;  Location: [facility] ORS;  Service: Neurosurgery;  Laterality: N/A;   BROW LIFT Bilateral 02/07/2020   Procedure: BLEPHAROPLASTY UPPER EYELID; W/EXCESS SKIN BROW PTOSIS REPAIR BILATERAL;  Surgeon: Karle Starch, MD;  Location: [place];  Service: Ophthalmology;  Laterality: Bilateral;   CATARACT EXTRACTION W/PHACO Right 09/13/2016   Procedure: CATARACT EXTRACTION PHACO AND INTRAOCULAR LENS PLACEMENT (IOC);  Surgeon: Birder Robson, MD;  Location: [facility] ORS;  Service: Ophthalmology;  Laterality: Right;  [place] 00:38 AP% 14.3 CDE 5.51 Fluid pack lot # [id number]   CATARACT EXTRACTION W/PHACO Left 10/11/2016   Procedure: CATARACT EXTRACTION PHACO AND INTRAOCULAR LENS PLACEMENT (IOC);  Surgeon:  Birder Robson, MD;  Location: [facility] ORS;  Service: Ophthalmology;  Laterality: Left;  [place] 00:30 AP% 11.3 CDE 3.50 fluid pack lot # [id number]   COLONOSCOPY WITH PROPOFOL N/A 09/30/2014   Procedure: COLONOSCOPY WITH PROPOFOL;  Surgeon: Robert Bellow, MD;  Location: [facility] ENDOSCOPY;  Service: Endoscopy;  Laterality: N/A;   COLONOSCOPY WITH PROPOFOL N/A 03/24/2021   Procedure: COLONOSCOPY WITH PROPOFOL;  Surgeon: Lin Landsman, MD;  Location: [facility] ENDOSCOPY;  Service: Gastroenterology;  Laterality: N/A;   ESOPHAGOGASTRODUODENOSCOPY N/A 09/30/2014   Procedure: ESOPHAGOGASTRODUODENOSCOPY (EGD);  Surgeon: Robert Bellow, MD;  Location: [facility] ENDOSCOPY;  Service: Endoscopy;  Laterality: N/A;   ESOPHAGOGASTRODUODENOSCOPY N/A 03/24/2021   Procedure: ESOPHAGOGASTRODUODENOSCOPY (EGD);  Surgeon: Lin Landsman, MD;  Location: [facility] ENDOSCOPY;  Service: Gastroenterology;  Laterality: N/A;   FOOT SURGERY Bilateral 04/26/1983   KNEE SURGERY  04/25/2014    Allergies: Patient has no known allergies.  Medications: Prior to Admission medications   Medication Sig Start Date End Date Taking? Authorizing Provider  albuterol (VENTOLIN HFA) 108 (90 Base) MCG/ACT inhaler Inhale 2 puffs into the lungs every 6 (six) hours as needed for wheezing or shortness of breath. 05/19/21   Crecencio Mc, MD  amLODipine (NORVASC) 5 MG tablet TAKE 1 TABLET BY MOUTH DAILY 03/14/22   Crecencio Mc, MD  budesonide (PULMICORT) 0.25 MG/2ML nebulizer solution Take 2 mLs (0.25 mg total) by nebulization 2 (two) times daily. 11/26/20   McLean-Scocuzza, Nino Glow, MD  Calcium Citrate-Vitamin D (CALCIUM CITRATE + D PO) Take 1 tablet by mouth 2 (two) times daily.    [provider]  citalopram (CELEXA) 20 MG tablet TAKE 1 TABLET  BY MOUTH DAILY 03/04/22   Crecencio Mc, MD  famotidine (PEPCID) 20 MG tablet TAKE 1 TABLET BY MOUTH DAILY. BEFORE DINNER 07/28/17   Crecencio Mc, MD  fexofenadine (ALLEGRA) 180 MG tablet  Take 180 mg by mouth daily.    [provider]  Fluticasone-Umeclidin-Vilant (TRELEGY ELLIPTA) 100-62.5-25 MCG/ACT AEPB Inhale 1 puff into the lungs daily. 10/20/21   Crecencio Mc, MD  gabapentin (NEURONTIN) 100 MG capsule TAKE 1 CAPSULE BY MOUTH 3 TIMES  DAILY 02/10/22   Crecencio Mc, MD  ipratropium-albuterol (DUONEB) 0.5-2.5 (3) MG/3ML SOLN Take 3 mLs by nebulization every 6 (six) hours as needed. 11/26/20   McLean-Scocuzza, Nino Glow, MD  levothyroxine (SYNTHROID) 50 MCG tablet TAKE 1 TABLET BY MOUTH DAILY ON  EMPTY STOMACH WITH A GLASS OF  WATER AT LEAST 30 TO 60 MIN  BEFORE BREAKFAST 03/14/22   Crecencio Mc, MD  oxybutynin (DITROPAN-XL) 10 MG 24 hr tablet TAKE 1 TABLET BY MOUTH AT BEDTIME. 12/24/21   Crecencio Mc, MD  pantoprazole (PROTONIX) 40 MG tablet TAKE 1 TABLET BY MOUTH DAILY 03/14/22   Crecencio Mc, MD  rosuvastatin (CRESTOR) 10 MG tablet TAKE 1 TABLET BY MOUTH DAILY 03/21/22   Crecencio Mc, MD  solifenacin (VESICARE) 5 MG tablet Take 1 tablet (5 mg total) by mouth daily. 02/07/22   Crecencio Mc, MD  traZODone (DESYREL) 50 MG tablet TAKE 1/2 TO 1 TABLET BY MOUTH AT BEDTIME AS NEEDED FOR SLEEP 10/18/21   Crecencio Mc, MD  vitamin B-12 (CYANOCOBALAMIN) 1000 MCG tablet Take 1,000 mcg by mouth daily.    [provider]     Family History  Problem Relation Age of Onset   Diabetes Mother    Hyperlipidemia Mother    Heart disease Mother    Prostate cancer Neg Hx    Kidney cancer Neg Hx    Bladder Cancer Neg Hx     Social History   Socioeconomic History   Marital status: Single    Spouse name: Not on file   Number of children: Not on file   Years of education: Not on file   Highest education level: Not on file  Occupational History   Not on file  Tobacco Use   Smoking status: Never   Smokeless tobacco: Never  Vaping Use   Vaping Use: Never used  Substance and Sexual Activity   Alcohol use: No   Drug use: No   Sexual activity: Never   Other Topics Concern   Not on file  Social History Narrative   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 No smoking; no alcohol. Lives in [place], [place] lives self/kitty.    Social Determinants of Health   Financial Resource Strain: Medium Risk (06/18/2021)   Overall Financial Resource Strain (CARDIA)    Difficulty of Paying Living Expenses: Somewhat hard  Food Insecurity: No Food Insecurity (04/14/2021)   Hunger Vital Sign    Worried About Running Out of Food in the Last Year: Never true    Ran Out of Food in the Last Year: Never true  Transportation Needs: No Transportation Needs (04/14/2021)   PRAPARE - Hydrologist (Medical): No    Lack of Transportation (Non-Medical): No  Physical Activity: Unknown (04/13/2020)   Exercise Vital Sign    Days of Exercise per Week: 0 days    Minutes of Exercise per Session: Not on file  Stress: No Stress Concern Present (04/14/2021)  [employer]    Feeling of Stress : Not at all  Social Connections: Unknown (04/14/2021)   Social Connection and Isolation Panel [NHANES]    Frequency of Communication with Friends and Family: More than three times a week    Frequency of Social Gatherings with Friends and Family: More than three times a week    Attends Religious Services: More than 4 times per year    Active Member of [employer] or Organizations: Yes    Attends Music therapist: More than 4 times per year    Marital Status: Not on file    Review of Systems: A 12 point ROS discussed and pertinent positives are indicated in the HPI above.  All other systems are negative.  Review of Systems  Constitutional:  Negative for appetite change and fatigue.  Respiratory:  Negative for cough and shortness of breath.   Cardiovascular:  Negative for chest pain and leg swelling.  Gastrointestinal:  Negative for abdominal pain, diarrhea, nausea and vomiting.   Neurological:  Negative for dizziness and headaches.    Vital Signs: BP (!) 119/53   Pulse 62   Temp 97.7 F (36.5 C) (Oral)   Resp 13   Ht _0  (1.6 m)   Wt 173 lb (78.5 kg)   SpO2 97%   BMI 30.65 kg/m   Physical Exam Constitutional:      General: She is not in acute distress.    Appearance: She is not ill-appearing.  HENT:     Mouth/Throat:     Mouth: Mucous membranes are moist.     Pharynx: Oropharynx is clear.  Cardiovascular:     Rate and Rhythm: Normal rate and regular rhythm.     Pulses: Normal pulses.     Heart sounds: Normal heart sounds.  Pulmonary:     Effort: Pulmonary effort is normal.     Breath sounds: Normal breath sounds.  Abdominal:     General: Bowel sounds are normal.     Palpations: Abdomen is soft.  Musculoskeletal:     Right lower leg: No edema.     Left lower leg: No edema.  Skin:    General: Skin is warm and dry.  Neurological:     Mental Status: She is alert and oriented to person, place, and time.     Imaging: No results found.  Labs:  CBC: Recent Labs    06/16/21 0923 08/27/21 1032 03/23/22 1434 04/01/22 0917  WBC 5.5 4.3 6.2 5.6  HGB 11.6* 11.7* 10.9* 10.3*  HCT 35.0* 34.3* 33.1* 31.4*  PLT 164 152 136* 134*    COAGS: No results for input(s): "INR", "APTT" in the last 8760 hours.  BMP: Recent Labs    06/16/21 0923 08/06/21 1612 08/27/21 1032 09/15/21 0758 03/23/22 1434  NA 141 142 136 140 141  K 3.9 4.1 3.8 3.9 3.8  CL 101 105 104 103 106  CO2 32 _1 GLUCOSE 113* 87 113* 105* 106*  BUN _2 CALCIUM 9.4 9.0 9.0 9.6 8.8*  CREATININE 0.77 0.89 0.80 0.92 0.78  GFRNONAA >60  --  >60  --  >60    LIVER FUNCTION TESTS: Recent Labs    06/16/21 0923 08/27/21 1032 09/15/21 0758 03/23/22 1434  BILITOT 0.5 0.5 0.5 0.3  AST 15 15 36 11*  ALT _3 ALKPHOS 67 71 73 58  PROT 7.2 7.1 6.8 7.1  ALBUMIN 4.0 4.1 4.2 4.0    TUMOR MARKERS: No results for input(s): "AFPTM", "CEA",  "CA199", "CHROMGRNA" in the last 8760 hours.  Assessment and Plan:  Anemia/thrombocytopenia; concern for MDS: Sarah [name], 71 year old female, presents today to the [facility] Interventional Radiology department for an image-guided bone marrow biopsy with aspiration.  Risks and benefits of this procedure were discussed with the patient and/or patient's family including, but not limited to bleeding, infection, damage to adjacent structures or low yield requiring additional tests.  All of the questions were answered and there is agreement to proceed. She has been NPO.   Consent signed and in chart.  Thank you for this interesting consult.  I greatly enjoyed meeting Sarah [name] and look forward to participating in their care.  A copy of this report was sent to the requesting provider on this date.  Electronically Signed: Soyla Dryer, AGACNP-BC [phone] 04/01/2022, 9:47 AM   I spent a total of  30 Minutes   in face to face in clinical consultation, greater than 50% of which was counseling/coordinating care for bone marrow biopsy with aspiration.

## 2022-04-01 ENCOUNTER — Ambulatory Visit
Admission: RE | Admit: 2022-04-01 | Discharge: 2022-04-01 | Disposition: A | Discharge status: Home / Self Care | Payer: Medicare Other | Source: Ambulatory Visit | Attending: Internal Medicine | Admitting: Internal Medicine

## 2022-04-01 DIAGNOSIS — D539 Nutritional anemia, unspecified: Secondary | ICD-10-CM | POA: Diagnosis not present

## 2022-04-01 DIAGNOSIS — R888 Abnormal findings in other body fluids and substances: Secondary | ICD-10-CM | POA: Diagnosis not present

## 2022-04-01 DIAGNOSIS — Z1379 Encounter for other screening for genetic and chromosomal anomalies: Secondary | ICD-10-CM | POA: Diagnosis not present

## 2022-04-01 DIAGNOSIS — D696 Thrombocytopenia, unspecified: Secondary | ICD-10-CM | POA: Diagnosis not present

## 2022-04-01 DIAGNOSIS — Z01812 Encounter for preprocedural laboratory examination: Secondary | ICD-10-CM

## 2022-04-01 LAB — CBC WITH DIFFERENTIAL/PLATELET
Abs Immature Granulocytes: 0.02 10*3/uL (ref 0.00–0.07)
Basophils Absolute: 0.1 10*3/uL (ref 0.0–0.1)
Basophils Relative: 2 %
Eosinophils Absolute: 0.2 10*3/uL (ref 0.0–0.5)
Eosinophils Relative: 3 %
HCT: 31.4 % — ABNORMAL LOW (ref 36.0–46.0)
Hemoglobin: 10.3 g/dL — ABNORMAL LOW (ref 12.0–15.0)
Immature Granulocytes: 0 %
Lymphocytes Relative: 27 %
Lymphs Abs: 1.5 10*3/uL (ref 0.7–4.0)
MCH: 34.8 pg — ABNORMAL HIGH (ref 26.0–34.0)
MCHC: 32.8 g/dL (ref 30.0–36.0)
MCV: 106.1 fL — ABNORMAL HIGH (ref 80.0–100.0)
Monocytes Absolute: 0.4 10*3/uL (ref 0.1–1.0)
Monocytes Relative: 8 %
Neutro Abs: 3.4 10*3/uL (ref 1.7–7.7)
Neutrophils Relative %: 60 %
Platelets: 134 10*3/uL — ABNORMAL LOW (ref 150–400)
RBC: 2.96 MIL/uL — ABNORMAL LOW (ref 3.87–5.11)
RDW: 13.7 % (ref 11.5–15.5)
WBC: 5.6 10*3/uL (ref 4.0–10.5)
nRBC: 0 % (ref 0.0–0.2)

## 2022-04-01 MED ORDER — LIDOCAINE HCL (PF) 1 % IJ SOLN
10.0000 mL | Freq: Once | INTRAMUSCULAR | Status: DC
  Filled 2022-04-01: qty 10

## 2022-04-01 MED ORDER — MIDAZOLAM HCL 2 MG/2ML IJ SOLN
INTRAMUSCULAR | Status: AC
  Filled 2022-04-01: qty 2

## 2022-04-01 MED ORDER — FENTANYL CITRATE (PF) 100 MCG/2ML IJ SOLN
INTRAMUSCULAR | Status: AC | PRN
  Administered 2022-04-01: 50 ug via INTRAVENOUS

## 2022-04-01 MED ORDER — FENTANYL CITRATE (PF) 100 MCG/2ML IJ SOLN
INTRAMUSCULAR | Status: AC
  Filled 2022-04-01: qty 2

## 2022-04-01 MED ORDER — SODIUM CHLORIDE 0.9 % IV SOLN: INTRAVENOUS | Status: DC

## 2022-04-01 MED ORDER — MIDAZOLAM HCL 2 MG/2ML IJ SOLN
INTRAMUSCULAR | Status: AC | PRN
  Administered 2022-04-01: 1 mg via INTRAVENOUS

## 2022-04-01 MED ORDER — HEPARIN SOD (PORK) LOCK FLUSH 100 UNIT/ML IV SOLN
INTRAVENOUS | Status: AC
  Filled 2022-04-01: qty 5

## 2022-04-01 NOTE — Progress Notes (Signed)
Patient clinically stable post BMB per DR El-Abd, tolerated well. Vitals stable pre and post procedure. Denies complaints at present time. Received Versed 1 mg along with Fentanyl 50 mcg IV for procedure. Report given to Genelle Bal RN post procedure/specials./329.

## 2022-04-01 NOTE — Procedures (Signed)
Interventional Radiology Procedure Note  Date of Procedure: 04/01/2022  Procedure: CT BMBx  Findings:  1. BMBx right ilium    Complications: No immediate complications noted.   Estimated Blood Loss: minimal  Follow-up and Recommendations: 1. Bedrest 1 hour    Albin Felling, MD  Vascular & Interventional Radiology  04/01/2022 10:39 AM

## 2022-04-01 NOTE — Discharge Instructions (Signed)
Bone Marrow Aspiration and Bone Marrow Biopsy, Adult, Care After This sheet gives you information about how to care for yourself after your procedure. If you have problems or questions, contact your health care provider.  What can I expect after the procedure?  After the procedure, it is common to have: Mild pain and tenderness. Swelling. Bruising.  Follow these instructions at home: Take over-the-counter or prescription medicines only as told by your health care provider. You may shower tomorrow Remove band aid tomorrow, replace with another bandaid if  site has any drainage from biopsy site. Wash your hands with soap and water before you touch your biopsy site  If soap and water are not available, use hand sanitizer. Change your dressing frequently for bleeding and/or drainage. Check your puncture site every day for signs of infection. Check for: More redness, swelling, or pain. More fluid or blood. Warmth. Pus or a bad smell. Return to your normal activities in 24hours.  Do not drive for 24 hours if you were given a medicine to help you relax (sedative). Keep all follow-up visits as told by your health care provider. This is important. Contact a health care provider if: You have more redness, swelling, or pain around the puncture site. You have more fluid or blood coming from the puncture site. Your puncture site feels warm to the touch. You have pus or a bad smell coming from the puncture site. You have a fever. Your pain is not controlled with medicine. This information is not intended to replace advice given to you by your health care provider. Make sure you discuss any questions you have with your health care provider. Document Released: 10/29/2004 Document Revised: 10/30/2015 Document Reviewed: 09/23/2015 Elsevier Interactive Patient Education  2018 Elsevier Inc. 

## 2022-04-04 LAB — SURGICAL PATHOLOGY

## 2022-04-07 ENCOUNTER — Telehealth: Payer: Self-pay

## 2022-04-07 ENCOUNTER — Ambulatory Visit (INDEPENDENT_AMBULATORY_CARE_PROVIDER_SITE_OTHER): Payer: Medicare Other

## 2022-04-07 ENCOUNTER — Encounter (HOSPITAL_COMMUNITY): Payer: Self-pay | Admitting: Internal Medicine

## 2022-04-07 ENCOUNTER — Ambulatory Visit
Admission: EM | Admit: 2022-04-07 | Discharge: 2022-04-07 | Disposition: A | Discharge status: Home / Self Care | Payer: Medicare Other | Attending: Urgent Care | Admitting: Urgent Care

## 2022-04-07 ENCOUNTER — Ambulatory Visit: Payer: Medicare Other

## 2022-04-07 DIAGNOSIS — R0602 Shortness of breath: Secondary | ICD-10-CM

## 2022-04-07 DIAGNOSIS — Z1152 Encounter for screening for COVID-19: Secondary | ICD-10-CM | POA: Diagnosis not present

## 2022-04-07 DIAGNOSIS — R059 Cough, unspecified: Secondary | ICD-10-CM | POA: Diagnosis not present

## 2022-04-07 DIAGNOSIS — J441 Chronic obstructive pulmonary disease with (acute) exacerbation: Secondary | ICD-10-CM | POA: Insufficient documentation

## 2022-04-07 LAB — RESP PANEL BY RT-PCR (RSV, FLU A&B, COVID)  RVPGX2
Influenza A by PCR: NEGATIVE
Influenza B by PCR: NEGATIVE
Resp Syncytial Virus by PCR: POSITIVE — AB
SARS Coronavirus 2 by RT PCR: NEGATIVE

## 2022-04-07 MED ORDER — LEVOFLOXACIN 500 MG PO TABS: 500.0000 mg | ORAL_TABLET | Freq: Every day | ORAL | 0 refills | Status: DC

## 2022-04-07 NOTE — ED Triage Notes (Signed)
Pt reports cough x 2-3 days that worsened overnight.  SOB with cough and exertion, SOB after walking to exam room.  Breathing improved with rest. Reports generalized weakness and chest congestion. Denies fever, HA, body aches, etc.

## 2022-04-07 NOTE — ED Provider Notes (Signed)
Sarah [name]    CSN: [id number] Arrival date & time: 04/07/22  1138      History   Chief Complaint Chief Complaint  Patient presents with   Cough    HPI Sarah [name] is a 71 y.o. female.    Cough   Presents to urgent care with symptoms x 2 to 3 days that worsened overnight.  She endorses shortness of breath which is worsened with cough and exertion.  She reports generalized weakness and chest congestion.  She denies fever, headache, body aches.  Respiratory rate of 24 in clinic.  O2 saturation of 95% on room air.  No fever.  Past Medical History:  Diagnosis Date   Arthritis    Asthma    Depression    Dyspnea    with heat   Environmental and seasonal allergies    GERD (gastroesophageal reflux disease)    Hypertension    Hypothyroidism    Presence of dental prosthetic device    Dental implants   Wheezing     Patient Active Problem List   Diagnosis Date Noted   Tubular adenoma of colon 02/07/2022   Dysuria 02/06/2022   Cervical myelopathy ([place]) 11/10/2021   Spinal stenosis, cervical region 09/15/2021   Adenomatous polyp of transverse colon    Polyp of colon    Thrombocytopenia ([place]) 01/13/2021   Essential hypertension 12/20/2020   Macrocytic anemia 12/18/2020   Allergic rhinitis 12/13/2020   Fatigue 12/13/2020   Insomnia 12/13/2020   Grief reaction 08/09/2020   Cognitive complaints 08/09/2020   Neuropathy 06/14/2020   Educated about COVID-19 virus infection 04/10/2019   Prediabetes 10/13/2018   LVH (left ventricular hypertrophy) due to hypertensive disease, without heart failure 10/13/2018   Chest tightness 06/26/2018   Asthma 06/04/2018   Hypothyroid 06/15/2017   Post herpetic neuralgia 06/06/2017   Cystitis 04/24/2016   Encounter for preventive health examination 09/09/2014   Chronic GERD 09/09/2014   Hyperlipidemia 08/17/2014   S/P hysterectomy 04/28/2013   Initial Medicare annual wellness visit 04/28/2013   Depression     Past  Surgical History:  Procedure Laterality Date   ABDOMINAL HYSTERECTOMY  04/26/1987   ANTERIOR CERVICAL DECOMPRESSION/DISCECTOMY FUSION 4 LEVELS N/A 11/10/2021   Procedure: C3-7 ANTERIOR CERVICAL DISCECTOMY AND FUSION (GLOBUS FORGE);  Surgeon: Meade Maw, MD;  Location: [facility] ORS;  Service: Neurosurgery;  Laterality: N/A;   BROW LIFT Bilateral 02/07/2020   Procedure: BLEPHAROPLASTY UPPER EYELID; W/EXCESS SKIN BROW PTOSIS REPAIR BILATERAL;  Surgeon: Karle Starch, MD;  Location: [place];  Service: Ophthalmology;  Laterality: Bilateral;   CATARACT EXTRACTION W/PHACO Right 09/13/2016   Procedure: CATARACT EXTRACTION PHACO AND INTRAOCULAR LENS PLACEMENT (IOC);  Surgeon: Birder Robson, MD;  Location: [facility] ORS;  Service: Ophthalmology;  Laterality: Right;  [place] 00:38 AP% 14.3 CDE 5.51 Fluid pack lot # [id number]   CATARACT EXTRACTION W/PHACO Left 10/11/2016   Procedure: CATARACT EXTRACTION PHACO AND INTRAOCULAR LENS PLACEMENT (IOC);  Surgeon: Birder Robson, MD;  Location: [facility] ORS;  Service: Ophthalmology;  Laterality: Left;  [place] 00:30 AP% 11.3 CDE 3.50 fluid pack lot # [id number]   COLONOSCOPY WITH PROPOFOL N/A 09/30/2014   Procedure: COLONOSCOPY WITH PROPOFOL;  Surgeon: Robert Bellow, MD;  Location: [facility] ENDOSCOPY;  Service: Endoscopy;  Laterality: N/A;   COLONOSCOPY WITH PROPOFOL N/A 03/24/2021   Procedure: COLONOSCOPY WITH PROPOFOL;  Surgeon: Lin Landsman, MD;  Location: [facility] ENDOSCOPY;  Service: Gastroenterology;  Laterality: N/A;   ESOPHAGOGASTRODUODENOSCOPY N/A 09/30/2014   Procedure: ESOPHAGOGASTRODUODENOSCOPY (EGD);  Surgeon:  Robert Bellow, MD;  Location: [facility] ENDOSCOPY;  Service: Endoscopy;  Laterality: N/A;   ESOPHAGOGASTRODUODENOSCOPY N/A 03/24/2021   Procedure: ESOPHAGOGASTRODUODENOSCOPY (EGD);  Surgeon: Lin Landsman, MD;  Location: [facility] ENDOSCOPY;  Service: Gastroenterology;  Laterality: N/A;   FOOT SURGERY Bilateral 04/26/1983   KNEE SURGERY   04/25/2014    OB History   No obstetric history on file.      Home Medications    Prior to Admission medications   Medication Sig Start Date End Date Taking? Authorizing Provider  amLODipine (NORVASC) 5 MG tablet TAKE 1 TABLET BY MOUTH DAILY 03/14/22  Yes Crecencio Mc, MD  famotidine (PEPCID) 20 MG tablet TAKE 1 TABLET BY MOUTH DAILY. BEFORE DINNER 07/28/17  Yes Crecencio Mc, MD  fexofenadine (ALLEGRA) 180 MG tablet Take 180 mg by mouth daily.   Yes [provider]  Fluticasone-Umeclidin-Vilant (TRELEGY ELLIPTA) 100-62.5-25 MCG/ACT AEPB Inhale 1 puff into the lungs daily. 10/20/21  Yes Crecencio Mc, MD  gabapentin (NEURONTIN) 100 MG capsule TAKE 1 CAPSULE BY MOUTH 3 TIMES  DAILY 02/10/22  Yes Crecencio Mc, MD  levothyroxine (SYNTHROID) 50 MCG tablet TAKE 1 TABLET BY MOUTH DAILY ON  EMPTY STOMACH WITH A GLASS OF  WATER AT LEAST 30 TO 60 MIN  BEFORE BREAKFAST 03/14/22  Yes Crecencio Mc, MD  oxybutynin (DITROPAN-XL) 10 MG 24 hr tablet TAKE 1 TABLET BY MOUTH AT BEDTIME. 12/24/21  Yes Crecencio Mc, MD  pantoprazole (PROTONIX) 40 MG tablet TAKE 1 TABLET BY MOUTH DAILY 03/14/22  Yes Crecencio Mc, MD  rosuvastatin (CRESTOR) 10 MG tablet TAKE 1 TABLET BY MOUTH DAILY 03/21/22  Yes Crecencio Mc, MD  solifenacin (VESICARE) 5 MG tablet Take 1 tablet (5 mg total) by mouth daily. 02/07/22  Yes Crecencio Mc, MD  traZODone (DESYREL) 50 MG tablet TAKE 1/2 TO 1 TABLET BY MOUTH AT BEDTIME AS NEEDED FOR SLEEP 10/18/21  Yes Crecencio Mc, MD  vitamin B-12 (CYANOCOBALAMIN) 1000 MCG tablet Take 1,000 mcg by mouth daily.   Yes [provider]  albuterol (VENTOLIN HFA) 108 (90 Base) MCG/ACT inhaler Inhale 2 puffs into the lungs every 6 (six) hours as needed for wheezing or shortness of breath. 05/19/21   Crecencio Mc, MD  budesonide (PULMICORT) 0.25 MG/2ML nebulizer solution Take 2 mLs (0.25 mg total) by nebulization 2 (two) times daily. 11/26/20   McLean-Scocuzza, Nino Glow,  MD  Calcium Citrate-Vitamin D (CALCIUM CITRATE + D PO) Take 1 tablet by mouth 2 (two) times daily.    [provider]  citalopram (CELEXA) 20 MG tablet TAKE 1 TABLET BY MOUTH DAILY 03/04/22   Crecencio Mc, MD  ipratropium-albuterol (DUONEB) 0.5-2.5 (3) MG/3ML SOLN Take 3 mLs by nebulization every 6 (six) hours as needed. 11/26/20   McLean-Scocuzza, Nino Glow, MD    Family History Family History  Problem Relation Age of Onset   Diabetes Mother    Hyperlipidemia Mother    Heart disease Mother    Prostate cancer Neg Hx    Kidney cancer Neg Hx    Bladder Cancer Neg Hx     Social History Social History   Tobacco Use   Smoking status: Never   Smokeless tobacco: Never  Vaping Use   Vaping Use: Never used  Substance Use Topics   Alcohol use: No   Drug use: No     Allergies   Patient has no known allergies.   Review of Systems Review of Systems  Respiratory:  Positive for cough.      Physical Exam Triage Vital Signs ED Triage Vitals [04/07/22 1232]  Enc Vitals Group     BP 133/74     Pulse Rate 95     Resp (!) 24     Temp 97.9 F (36.6 C)     Temp Source Oral     SpO2 95 %     Weight      Height      Head Circumference      Peak Flow      Pain Score 0     Pain Loc      Pain Edu?      Excl. in [place]?    No data found.  Updated Vital Signs BP 133/74   Pulse 95   Temp 97.9 F (36.6 C) (Oral)   Resp (!) 24   SpO2 95%   Visual Acuity Right Eye Distance:   Left Eye Distance:   Bilateral Distance:    Right Eye Near:   Left Eye Near:    Bilateral Near:     Physical Exam Vitals reviewed.  HENT:     Mouth/Throat:     Pharynx: Posterior oropharyngeal erythema present. No oropharyngeal exudate.     Tonsils: 4+ on the right. 3+ on the left.  Cardiovascular:     Rate and Rhythm: Normal rate and regular rhythm.     Pulses: Normal pulses.  Pulmonary:     Effort: Tachypnea present.     Breath sounds: Decreased air movement present. No wheezing,  rhonchi or rales.  Skin:    General: Skin is warm and dry.  Neurological:     General: No focal deficit present.     Mental Status: She is alert and oriented to person, place, and time.  Psychiatric:        Mood and Affect: Mood normal.        Behavior: Behavior normal.      UC Treatments / Results  Labs (all labs ordered are listed, but only abnormal results are displayed) Labs Reviewed - No data to display  EKG   Radiology No results found.  Procedures Procedures (including critical care time)  Medications Ordered in UC Medications - No data to display  Initial Impression / Assessment and Plan / UC Course  I have reviewed the triage vital signs and the nursing notes.  Pertinent labs & imaging results that were available during my care of the patient were reviewed by me and considered in my medical decision making (see chart for details).   Patient is afebrile here without recent antipyretics. Satting 95% on room air. Overall is ill appearing, well hydrated, with labored breathing. She has difficulty completing a sentence during exam. Pulmonary exam is remarkable for shallow breathing and tachypnea.  There are no wheezes, rhonchi, rales.  Tonsils are 4+ on the right, 3+ on the left.  There is some pharyngeal erythema without visible peritonsillar exudate.  Chest xray is obtained and indicates no active cardio pulmonary process.  Will discharge with levofloxacin 500 mg x 5 days for COPD exacerbation and instruct patient to seek immediate evaluation in the ED if her symptoms worsen.  Respiratory swab is obtained for flu, COVID, RSV.  Patient is at risk for poor outcome if positive RSV.  Final Clinical Impressions(s) / UC Diagnoses   Final diagnoses:  None   Discharge Instructions   None    ED Prescriptions   None    PDMP  not reviewed this encounter.   Rose Phi, [place] 04/07/22 1348

## 2022-04-07 NOTE — Discharge Instructions (Signed)
The results of your chest x-ray were negative for an active cardio or pulmonary illness.  I am treating you with levofloxacin for an exacerbation of your asthma.  If your symptoms worsen, I recommend you seek immediate treatment in the emergency department.

## 2022-04-07 NOTE — Telephone Encounter (Signed)
Confirmed with pathology in [place] that sample is sufficient for [facility] Heme testing.  Order for [facility] Heme submitted online ([id number]) for bone marrow biopsy collected on 04/01/22, Specimen ID [id number].  Request for specimen to be sent to [facility] faxed to pathology in [place].

## 2022-04-08 ENCOUNTER — Telehealth: Payer: Self-pay | Admitting: Internal Medicine

## 2022-04-08 MED ORDER — PREDNISONE 10 MG PO TABS: ORAL_TABLET | ORAL | 0 refills | Status: DC

## 2022-04-08 MED ORDER — CHERATUSSIN AC 100-10 MG/5ML PO SOLN: 5.0000 mL | Freq: Three times a day (TID) | ORAL | 0 refills | Status: DC | PRN

## 2022-04-08 NOTE — Addendum Note (Signed)
Addended by: Crecencio Mc on: 04/08/2022 04:57 PM   Modules accepted: Orders

## 2022-04-08 NOTE — Telephone Encounter (Signed)
Cough suppressant and prednisone taper sent to pharmacy

## 2022-04-08 NOTE — Telephone Encounter (Signed)
Patient called and said she was tested for RSV, yesterday. She tested positive. She was given a antibiotic. She would like to know if there is anything else she can take and how long is she contagious.

## 2022-04-08 NOTE — Telephone Encounter (Signed)
Pt is aware of the medication that have been sent in to take with the antibiotic.

## 2022-04-11 ENCOUNTER — Encounter (HOSPITAL_COMMUNITY): Payer: Self-pay | Admitting: Internal Medicine

## 2022-04-12 ENCOUNTER — Telehealth: Payer: Self-pay | Admitting: Internal Medicine

## 2022-04-12 DIAGNOSIS — R0600 Dyspnea, unspecified: Secondary | ICD-10-CM | POA: Diagnosis not present

## 2022-04-12 DIAGNOSIS — R059 Cough, unspecified: Secondary | ICD-10-CM | POA: Diagnosis not present

## 2022-04-12 DIAGNOSIS — J441 Chronic obstructive pulmonary disease with (acute) exacerbation: Secondary | ICD-10-CM | POA: Diagnosis not present

## 2022-04-12 DIAGNOSIS — R062 Wheezing: Secondary | ICD-10-CM | POA: Diagnosis not present

## 2022-04-12 MED ORDER — CHERATUSSIN AC 100-10 MG/5ML PO SOLN: 5.0000 mL | Freq: Three times a day (TID) | ORAL | 0 refills | Status: DC | PRN

## 2022-04-12 NOTE — Telephone Encounter (Signed)
Copied from [place] [id number]. Topic: Medicare AWV >> Apr 12, 2022 11:01 AM Devoria Glassing wrote: Reason for CRM: Left message for patient to schedule Annual Wellness Visit.  Please schedule with Nurse Health Advisor Madelyn Brunner, LPN at [facility]. This appt can be telephone or office visit.  Please call [phone] ask for [facility]

## 2022-04-12 NOTE — Telephone Encounter (Signed)
Pt would like to be called in regards to her having the RSV

## 2022-04-12 NOTE — Telephone Encounter (Signed)
Spoke with pt and to let her know that the cough medication was sent in. Pt gave a verbal understanding and also stated that she was at the walk in clinic now.

## 2022-04-12 NOTE — Addendum Note (Signed)
Addended by: Crecencio Mc on: 04/12/2022 05:32 PM   Modules accepted: Orders

## 2022-04-12 NOTE — Telephone Encounter (Signed)
Spoke with pt and she was still having a lot of wheezing and rattling in the chest when she was talking to me. Pt was advised that she needs to be reevaluated in person so someone can listen to her lungs. Pt has been scheduled with [facility] for tomorrow but pt stated that she may go on over to [facility]. If she does she was told to give the office a call to cancel her appt with [facility]. Pt also wanted to know if she could get a refill of the cough syrup that was called in for her last week. She stated that she has had to use it more often but it is helping with the cough.

## 2022-04-12 NOTE — Telephone Encounter (Signed)
cough medication refilled

## 2022-04-13 ENCOUNTER — Ambulatory Visit: Payer: Medicare Other | Admitting: Primary Care

## 2022-04-14 NOTE — Telephone Encounter (Signed)
Confirmed with [facility] path that specimen was sent out yesterday.

## 2022-04-20 ENCOUNTER — Inpatient Hospital Stay: Payer: Medicare Other | Admitting: Internal Medicine

## 2022-04-20 DIAGNOSIS — D469 Myelodysplastic syndrome, unspecified: Secondary | ICD-10-CM | POA: Insufficient documentation

## 2022-04-20 DIAGNOSIS — D46Z Other myelodysplastic syndromes: Secondary | ICD-10-CM | POA: Insufficient documentation

## 2022-04-20 NOTE — Progress Notes (Deleted)
[place] NOTE  Patient Care Team: Crecencio Mc, MD as PCP - General (Internal Medicine) Crecencio Mc, MD (Internal Medicine) Bary Castilla, Forest Gleason, MD (General Surgery) Cammie Sickle, MD as Consulting Physician (Hematology and Oncology)  CHIEF COMPLAINTS/PURPOSE OF CONSULTATION: ANEMIA  HEMATOLOGY HISTORY:  # MACROCYTIC ANEMIA [since 2022- April hb 11; MCV-103; platelet- 120s] EGD/ colonoscopy >>> 10 years ago; OCT 2022-slight improvement blood counts/stable; hepatitis HIV negative.  Myeloma panel negative.  HOLD off  bone marrow biopsy;  #Incidental right hepatic lobe 13 mm hypoechoic lesion [ultrasound-October 2022]-MRI OCT 2022-   Low clinical concern for metastatic disease; however need to rule out primary [place] versus others.  1. There are 3 small liver lesions, 2 of which represent simple cysts. 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 MAY 2023-abdominal MRI -benign cyst no further surveillance recommended.   #October 2022-ultrasound incidental bilateral mild to moderate hydronephrosis.-Rs/p evaluation with Dr.Brandon urology.   HISTORY OF PRESENTING ILLNESS: Ambulating independently.  With friend.   Sarah [name] 71 y.o.  female is here for follow-up today anemia/thrombocytopenia is here for a follow up.   In the interim patient underwent neck surgery.  Healing well.  No complications.   Denies any blood in stools or black-colored stools.  Patient denies any nausea vomiting.  Chronic mild fatigue.  Chronic mild easy bruising.  No tremors.  Review of Systems  Constitutional:  Positive for malaise/fatigue. Negative for chills, diaphoresis, fever and weight loss.  HENT:  Negative for nosebleeds and sore throat.   Eyes:  Negative for double vision.  Respiratory:  Negative for cough, hemoptysis, sputum production, shortness of breath and wheezing.   Cardiovascular:   Negative for chest pain, palpitations, orthopnea and leg swelling.  Gastrointestinal:  Negative for abdominal pain, blood in stool, constipation, diarrhea, heartburn, melena, nausea and vomiting.  Genitourinary:  Negative for dysuria, frequency and urgency.  Musculoskeletal:  Positive for joint pain. Negative for back pain.  Skin: Negative.  Negative for itching and rash.  Neurological:  Negative for dizziness, tingling, focal weakness, weakness and headaches.  Endo/Heme/Allergies:  Bruises/bleeds easily.  Psychiatric/Behavioral:  Negative for depression. The patient is not nervous/anxious and does not have insomnia.     MEDICAL HISTORY:  Past Medical History:  Diagnosis Date   Arthritis    Asthma    Depression    Dyspnea    with heat   Environmental and seasonal allergies    GERD (gastroesophageal reflux disease)    Hypertension    Hypothyroidism    Presence of dental prosthetic device    Dental implants   Wheezing     SURGICAL HISTORY: Past Surgical History:  Procedure Laterality Date   ABDOMINAL HYSTERECTOMY  04/26/1987   ANTERIOR CERVICAL DECOMPRESSION/DISCECTOMY FUSION 4 LEVELS N/A 11/10/2021   Procedure: C3-7 ANTERIOR CERVICAL DISCECTOMY AND FUSION (GLOBUS FORGE);  Surgeon: Meade Maw, MD;  Location: [facility] ORS;  Service: Neurosurgery;  Laterality: N/A;   BROW LIFT Bilateral 02/07/2020   Procedure: BLEPHAROPLASTY UPPER EYELID; W/EXCESS SKIN BROW PTOSIS REPAIR BILATERAL;  Surgeon: Karle Starch, MD;  Location: [place];  Service: Ophthalmology;  Laterality: Bilateral;   CATARACT EXTRACTION W/PHACO Right 09/13/2016   Procedure: CATARACT EXTRACTION PHACO AND INTRAOCULAR LENS PLACEMENT (IOC);  Surgeon: Birder Robson, MD;  Location: [facility] OR[facility];  Service: Ophthalmology;  Laterality: Right;  [place] 00:38 AP% 14.3 CDE 5.51 Fluid pack lot # [id number]   CATARACT EXTRACTION W/PHACO  Left 10/11/2016   Procedure: CATARACT EXTRACTION PHACO AND INTRAOCULAR LENS PLACEMENT  (IOC);  Surgeon: Birder Robson, MD;  Location: [facility];  Service: Ophthalmology;  Laterality: Left;  [place] 00:30 AP% 11.3 CDE 3.50 fluid pack lot # [id number]   COLONOSCOPY WITH PROPOFOL N/A 09/30/2014   Procedure: COLONOSCOPY WITH PROPOFOL;  Surgeon: Robert Bellow, MD;  Location: [facility] ENDOSCOPY;  Service: Endoscopy;  Laterality: N/A;   COLONOSCOPY WITH PROPOFOL N/A 03/24/2021   Procedure: COLONOSCOPY WITH PROPOFOL;  Surgeon: Lin Landsman, MD;  Location: [facility] ENDOSCOPY;  Service: Gastroenterology;  Laterality: N/A;   ESOPHAGOGASTRODUODENOSCOPY N/A 09/30/2014   Procedure: ESOPHAGOGASTRODUODENOSCOPY (EGD);  Surgeon: Robert Bellow, MD;  Location: [facility] ENDOSCOPY;  Service: Endoscopy;  Laterality: N/A;   ESOPHAGOGASTRODUODENOSCOPY N/A 03/24/2021   Procedure: ESOPHAGOGASTRODUODENOSCOPY (EGD);  Surgeon: Lin Landsman, MD;  Location: [facility] ENDOSCOPY;  Service: Gastroenterology;  Laterality: N/A;   FOOT SURGERY Bilateral 04/26/1983   KNEE SURGERY  04/25/2014    SOCIAL HISTORY: Social History   Socioeconomic History   Marital status: Single    Spouse name: Not on file   Number of children: Not on file   Years of education: Not on file   Highest education level: Not on file  Occupational History   Not on file  Tobacco Use   Smoking status: Never   Smokeless tobacco: Never  Vaping Use   Vaping Use: Never used  Substance and Sexual Activity   Alcohol use: No   Drug use: No   Sexual activity: Never  Other Topics Concern   Not on file  Social History Narrative   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 No smoking; no alcohol. Lives in [place], [place] lives self/kitty.    Social Determinants of Health   Financial Resource Strain: Medium Risk (06/18/2021)   Overall Financial Resource Strain (CARDIA)    Difficulty of Paying Living Expenses: Somewhat hard  Food Insecurity: No Food Insecurity (04/14/2021)   Hunger Vital Sign    Worried About Running Out of Food in the  Last Year: Never true    Ran Out of Food in the Last Year: Never true  Transportation Needs: No Transportation Needs (04/14/2021)   PRAPARE - Hydrologist (Medical): No    Lack of Transportation (Non-Medical): No  Physical Activity: Unknown (04/13/2020)   Exercise Vital Sign    Days of Exercise per Week: 0 days    Minutes of Exercise per Session: Not on file  Stress: No Stress Concern Present (04/14/2021)   [place]    Feeling of Stress : Not at all  Social Connections: Unknown (04/14/2021)   Social Connection and Isolation Panel [NHANES]    Frequency of Communication with Friends and Family: More than three times a week    Frequency of Social Gatherings with Friends and Family: More than three times a week    Attends Religious Services: More than 4 times per year    Active Member of [employer] or Organizations: Yes    Attends Archivist Meetings: More than 4 times per year    Marital Status: Not on file  Intimate Partner Violence: Not At Risk (04/14/2021)   Humiliation, Afraid, Rape, and Kick questionnaire    Fear of Current or Ex-Partner: No    Emotionally Abused: No    Physically Abused: No    Sexually Abused: No    FAMILY HISTORY: Family History  Problem Relation Age of Onset  Diabetes Mother    Hyperlipidemia Mother    Heart disease Mother    Prostate cancer Neg Hx    Kidney cancer Neg Hx    Bladder Cancer Neg Hx     ALLERGIES:  has No Known Allergies.  MEDICATIONS:  Current Outpatient Medications  Medication Sig Dispense Refill   albuterol (VENTOLIN HFA) 108 (90 Base) MCG/ACT inhaler Inhale 2 puffs into the lungs every 6 (six) hours as needed for wheezing or shortness of breath. 3 each 3   amLODipine (NORVASC) 5 MG tablet TAKE 1 TABLET BY MOUTH DAILY 100 tablet 2   budesonide (PULMICORT) 0.25 MG/2ML nebulizer solution Take 2 mLs (0.25 mg total) by nebulization 2  (two) times daily. 60 mL 12   Calcium Citrate-Vitamin D (CALCIUM CITRATE + D PO) Take 1 tablet by mouth 2 (two) times daily.     citalopram (CELEXA) 20 MG tablet TAKE 1 TABLET BY MOUTH DAILY 90 tablet 3   famotidine (PEPCID) 20 MG tablet TAKE 1 TABLET BY MOUTH DAILY. BEFORE DINNER 30 tablet 11   fexofenadine (ALLEGRA) 180 MG tablet Take 180 mg by mouth daily.     Fluticasone-Umeclidin-Vilant (TRELEGY ELLIPTA) 100-62.5-25 MCG/ACT AEPB Inhale 1 puff into the lungs daily. 1 each 11   gabapentin (NEURONTIN) 100 MG capsule TAKE 1 CAPSULE BY MOUTH 3 TIMES  DAILY 300 capsule 2   guaiFENesin-codeine (CHERATUSSIN AC) 100-10 MG/5ML syrup Take 5 mLs by mouth 3 (three) times daily as needed for cough. 120 mL 0   ipratropium-albuterol (DUONEB) 0.5-2.5 (3) MG/3ML SOLN Take 3 mLs by nebulization every 6 (six) hours as needed. 360 mL 2   levofloxacin (LEVAQUIN) 500 MG tablet Take 1 tablet (500 mg total) by mouth daily. 5 tablet 0   levothyroxine (SYNTHROID) 50 MCG tablet TAKE 1 TABLET BY MOUTH DAILY ON  EMPTY STOMACH WITH A GLASS OF  WATER AT LEAST 30 TO 60 MIN  BEFORE BREAKFAST 100 tablet 2   oxybutynin (DITROPAN-XL) 10 MG 24 hr tablet TAKE 1 TABLET BY MOUTH AT BEDTIME. 30 tablet 2   pantoprazole (PROTONIX) 40 MG tablet TAKE 1 TABLET BY MOUTH DAILY 100 tablet 2   predniSONE (DELTASONE) 10 MG tablet 6 tablets daily for 3 days, then reduce by 1 tablet daily until gone 33 tablet 0   rosuvastatin (CRESTOR) 10 MG tablet TAKE 1 TABLET BY MOUTH DAILY 100 tablet 2   solifenacin (VESICARE) 5 MG tablet Take 1 tablet (5 mg total) by mouth daily. 30 tablet 2   traZODone (DESYREL) 50 MG tablet TAKE 1/2 TO 1 TABLET BY MOUTH AT BEDTIME AS NEEDED FOR SLEEP 90 tablet 3   vitamin B-12 (CYANOCOBALAMIN) 1000 MCG tablet Take 1,000 mcg by mouth daily.     No current facility-administered medications for this visit.      PHYSICAL EXAMINATION:   There were no vitals filed for this visit.   There were no vitals filed for this  visit.    Physical Exam Vitals and nursing note reviewed.  HENT:     Head: Normocephalic and atraumatic.     Mouth/Throat:     Pharynx: Oropharynx is clear.  Eyes:     Extraocular Movements: Extraocular movements intact.     Pupils: Pupils are equal, round, and reactive to light.  Cardiovascular:     Rate and Rhythm: Normal rate and regular rhythm.  Pulmonary:     Comments: Decreased breath sounds bilaterally.  Abdominal:     Palpations: Abdomen is soft.  Musculoskeletal:  General: Normal range of motion.     Cervical back: Normal range of motion.  Skin:    General: Skin is warm.  Neurological:     General: No focal deficit present.     Mental Status: She is alert and oriented to person, place, and time.  Psychiatric:        Behavior: Behavior normal.        Judgment: Judgment normal.     LABORATORY DATA:  I have reviewed the data as listed Lab Results  Component Value Date   WBC 5.6 04/01/2022   HGB 10.3 (L) 04/01/2022   HCT 31.4 (L) 04/01/2022   MCV 106.1 (H) 04/01/2022   PLT 134 (L) 04/01/2022   Recent Labs    06/16/21 0923 08/06/21 1612 08/27/21 1032 09/15/21 0758 03/23/22 1434  NA 141   < > 136 140 141  K 3.9   < > 3.8 3.9 3.8  CL 101   < > 104 103 106  CO2 32   < > $R'26 29 29  'ub$ GLUCOSE 113*   < > 113* 105* 106*  BUN 17   < > $R'17 18 18  'QT$ CREATININE 0.77   < > 0.80 0.92 0.78  CALCIUM 9.4   < > 9.0 9.6 8.8*  GFRNONAA >60  --  >60  --  >60  PROT 7.2  --  7.1 6.8 7.1  ALBUMIN 4.0  --  4.1 4.2 4.0  AST 15  --  15 36 11*  ALT 17  --  $R'13 28 11  'yM$ ALKPHOS 67  --  71 73 58  BILITOT 0.5  --  0.5 0.5 0.3   < > = values in this interval not displayed.      DG Chest 2 View  Result Date: 04/07/2022 CLINICAL DATA:  Productive cough for 2 days in a 71 year old female. EXAM: CHEST - 2 VIEW COMPARISON:  August 06, 2021 FINDINGS: Cardiomediastinal contours and hilar structures are stable. No lobar consolidation. No sign of pleural effusion. No pneumothorax. On  limited assessment no acute skeletal process. IMPRESSION: No active cardiopulmonary disease. Electronically Signed   By: Zetta Bills M.D.   On: 04/07/2022 13:32   CT BONE MARROW BIOPSY & ASPIRATION  Result Date: 04/01/2022 INDICATION: macrocytic anemia EXAM: CT BONE MARROW BIOPSY AND ASPIRATION MEDICATIONS: None. ANESTHESIA/SEDATION: Moderate (conscious) sedation was employed during this procedure. A total of Versed 1 mg and Fentanyl 50 mcg was administered intravenously. Moderate Sedation Time: 12 minutes. The patient's level of consciousness and vital signs were monitored continuously by radiology nursing throughout the procedure under my direct supervision. FLUOROSCOPY TIME:  N/a COMPLICATIONS: None immediate. PROCEDURE: Informed written consent was obtained from the patient after a thorough discussion of the procedural risks, benefits and alternatives. All questions were addressed. Maximal Sterile Barrier Technique was utilized including caps, mask, sterile gowns, sterile gloves, sterile drape, hand hygiene and skin antiseptic. A timeout was performed prior to the initiation of the procedure. The patient was placed prone on the CT exam table. Limited CT of the pelvis was performed for planning purposes. Skin entry site was marked, and the overlying skin was prepped and draped in the standard sterile fashion. Local analgesia was obtained with 1% lidocaine. Using CT guidance, an 11 gauge needle was advanced just deep to the cortex of the right posterior ilium. Subsequently, bone marrow aspiration and core biopsy were performed. Specimens were submitted to lab/pathology for handling. Hemostasis was achieved with manual pressure, and a clean dressing was  placed. The patient tolerated the procedure well without immediate complication. IMPRESSION: Successful CT-guided bone marrow aspiration and core biopsy of the right posterior ilium. Electronically Signed   By: Albin Felling M.D.   On: 04/01/2022 13:16     No problem-specific Assessment & Plan notes found for this encounter.     All questions were answered. The patient knows to call the clinic with any problems, questions or concerns.      Cammie Sickle, MD 04/20/2022 8:34 AM

## 2022-04-26 ENCOUNTER — Encounter: Payer: Self-pay | Admitting: Internal Medicine

## 2022-04-26 ENCOUNTER — Inpatient Hospital Stay: Payer: Medicare Other | Attending: Internal Medicine | Admitting: Internal Medicine

## 2022-04-26 VITALS — BP 117/58 | HR 70 | Temp 98.6°F | Resp 20 | Wt 169.2 lb

## 2022-04-26 DIAGNOSIS — Z8249 Family history of ischemic heart disease and other diseases of the circulatory system: Secondary | ICD-10-CM | POA: Insufficient documentation

## 2022-04-26 DIAGNOSIS — Z90710 Acquired absence of both cervix and uterus: Secondary | ICD-10-CM | POA: Insufficient documentation

## 2022-04-26 DIAGNOSIS — E039 Hypothyroidism, unspecified: Secondary | ICD-10-CM | POA: Diagnosis not present

## 2022-04-26 DIAGNOSIS — R5383 Other fatigue: Secondary | ICD-10-CM | POA: Insufficient documentation

## 2022-04-26 DIAGNOSIS — Z5986 Financial insecurity: Secondary | ICD-10-CM | POA: Diagnosis not present

## 2022-04-26 DIAGNOSIS — D539 Nutritional anemia, unspecified: Secondary | ICD-10-CM | POA: Diagnosis not present

## 2022-04-26 DIAGNOSIS — Z79890 Hormone replacement therapy (postmenopausal): Secondary | ICD-10-CM | POA: Diagnosis not present

## 2022-04-26 DIAGNOSIS — Z79899 Other long term (current) drug therapy: Secondary | ICD-10-CM | POA: Diagnosis not present

## 2022-04-26 DIAGNOSIS — D46Z Other myelodysplastic syndromes: Secondary | ICD-10-CM

## 2022-04-26 DIAGNOSIS — D696 Thrombocytopenia, unspecified: Secondary | ICD-10-CM | POA: Insufficient documentation

## 2022-04-26 DIAGNOSIS — Z8349 Family history of other endocrine, nutritional and metabolic diseases: Secondary | ICD-10-CM | POA: Diagnosis not present

## 2022-04-26 DIAGNOSIS — D469 Myelodysplastic syndrome, unspecified: Secondary | ICD-10-CM | POA: Insufficient documentation

## 2022-04-26 DIAGNOSIS — Z7952 Long term (current) use of systemic steroids: Secondary | ICD-10-CM | POA: Insufficient documentation

## 2022-04-26 DIAGNOSIS — I10 Essential (primary) hypertension: Secondary | ICD-10-CM | POA: Insufficient documentation

## 2022-04-26 DIAGNOSIS — M2550 Pain in unspecified joint: Secondary | ICD-10-CM | POA: Insufficient documentation

## 2022-04-26 DIAGNOSIS — Z833 Family history of diabetes mellitus: Secondary | ICD-10-CM | POA: Insufficient documentation

## 2022-04-26 LAB — SURGICAL PATHOLOGY

## 2022-04-26 NOTE — Progress Notes (Signed)
[facility] CONSULT NOTE  Patient Care Team: Sherlene Shams, MD as PCP - General (Internal Medicine) Sherlene Shams, MD (Internal Medicine) Lemar Livings, Merrily Pew, MD (General Surgery) Earna Coder, MD as Consulting Physician (Hematology and Oncology)  CHIEF COMPLAINTS/PURPOSE OF CONSULTATION: MDS  HEMATOLOGY HISTORY: Oncology History Overview Note  DEC 2023- DIAGNOSIS: LOW GRADE MDS- 5 Q DEL;   # MACROCYTIC ANEMIA [since 2022- April hb 11; MCV-103; platelet- 120s] EGD/ colonoscopy >>> 10 years ago; OCT 2022-slight improvement blood counts/stable; hepatitis HIV negative.  Myeloma panel negative.   BONE MARROW, ASPIRATE, CLOT, CORE: -Hypercellular bone marrow for age with dyspoietic changes -See comment  PERIPHERAL BLOOD: -Macrocytic anemia -Thrombocytopenia  COMMENT:  The overall findings are very concerning for involvement by a low-grade myelodysplastic syndrome.  The differential diagnosis includes changes related to nutritional deficiency, medication, immune mediated process, infection, etc. Correlation with cytogenetic and FISH studies is recommended.  MICROSCOPIC DESCRIPTION:  PERIPHERAL BLOOD SMEAR: The red blood cells display mild anisopoikilocytosis with mild polychromasia.  The white blood cells are normal in number with occasional hypogranular and/or hypolobated neutrophils.  Scattered reactive appearing lymphocytes are present.  The platelets are decreased in number.  BONE MARROW ASPIRATE: Bone marrow particles present. Erythroid precursors: Progressive maturation with occasional late precursors displaying nuclear cytoplasmic dyssynchrony. Granulocytic precursors: Progressive maturation with many mature neutrophils displaying hypogranulation and/or hypolobation.  No increase in blastic cells identified. Megakaryocytes: Abundant with many small and/or hypolobated/unilobated forms Lymphocytes/plasma cells: Large aggregates not  present  TOUCH PREPARATIONS: A mixture of cell types present.  CLOT AND BIOPSY: The sections show variable cellularity ranging from 10% focally to 70% with a mixture of cell types.  Expansile sheets of blastic cells are not identified.  A small interstitial lymphoid aggregate composed of small lymphoid cells is seen.  IRON STAIN: Iron stains are performed on a bone marrow aspirate or touch imprint smear and section of clot. The controls stained appropriately.       Storage Iron:  Abundant      Ring Sideroblasts: Absent  ADDITIONAL DATA/TESTING: The specimen was sent for cytogenetic analysis and FISH for MDS and a separate report will follow. Flow cytometric analysis (WLS [id number]) was performed and shows T cells with nonspecific changes.  No monoclonal B-cell population or significant CD34 positive blastic population identified.     #Incidental right hepatic lobe 13 mm hypoechoic lesion [ultrasound-October 2022]-MRI OCT 2022-   Low clinical concern for metastatic disease; however need to rule out primary HCC versus others.  1. There are 3 small liver lesions, 2 of which represent simple cysts. 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 MAY 2023-abdominal MRI -benign cyst no further surveillance recommended.   #October 2022-ultrasound incidental bilateral mild to moderate hydronephrosis.-Rs/p evaluation with Dr.Brandon urology.   MDS (myelodysplastic syndrome), low grade (HCC)  04/20/2022 Initial Diagnosis   MDS (myelodysplastic syndrome), low grade (HCC)      HISTORY OF PRESENTING ILLNESS: Ambulating independently.  With friend.   Sarah [name] 72 y.o.  female is here for follow-up today anemia/thrombocytopenia is here for a follow up.   In the interim patient underwent neck surgery.  Healing well.  No complications.   Denies any blood in stools or black-colored stools.  Patient denies any nausea  vomiting.  Chronic mild fatigue.  Chronic mild easy bruising.  No tremors.  Review of Systems  Constitutional:  Positive for malaise/fatigue. Negative for chills, diaphoresis,  fever and weight loss.  HENT:  Negative for nosebleeds and sore throat.   Eyes:  Negative for double vision.  Respiratory:  Negative for cough, hemoptysis, sputum production, shortness of breath and wheezing.   Cardiovascular:  Negative for chest pain, palpitations, orthopnea and leg swelling.  Gastrointestinal:  Negative for abdominal pain, blood in stool, constipation, diarrhea, heartburn, melena, nausea and vomiting.  Genitourinary:  Negative for dysuria, frequency and urgency.  Musculoskeletal:  Positive for joint pain. Negative for back pain.  Skin: Negative.  Negative for itching and rash.  Neurological:  Negative for dizziness, tingling, focal weakness, weakness and headaches.  Endo/Heme/Allergies:  Bruises/bleeds easily.  Psychiatric/Behavioral:  Negative for depression. The patient is not nervous/anxious and does not have insomnia.     MEDICAL HISTORY:  Past Medical History:  Diagnosis Date   Arthritis    Asthma    Depression    Dyspnea    with heat   Environmental and seasonal allergies    GERD (gastroesophageal reflux disease)    Hypertension    Hypothyroidism    Presence of dental prosthetic device    Dental implants   Wheezing     SURGICAL HISTORY: Past Surgical History:  Procedure Laterality Date   ABDOMINAL HYSTERECTOMY  04/26/1987   ANTERIOR CERVICAL DECOMPRESSION/DISCECTOMY FUSION 4 LEVELS N/A 11/10/2021   Procedure: C3-7 ANTERIOR CERVICAL DISCECTOMY AND FUSION (GLOBUS FORGE);  Surgeon: Venetia Night, MD;  Location: [facility] OR[facility];  Service: Neurosurgery;  Laterality: N/A;   BROW LIFT Bilateral 02/07/2020   Procedure: BLEPHAROPLASTY UPPER EYELID; W/EXCESS SKIN BROW PTOSIS REPAIR BILATERAL;  Surgeon: Imagene Riches, MD;  Location[facility];  Service: Ophthalmology;  Laterality:  Bilateral;   CATARACT EXTRACTION W/PHACO Right 09/13/2016   Procedure: CATARACT EXTRACTION PHACO AND INTRAOCULAR LENS PLACEMENT (IOC);  Surgeon: Galen Manila, MD;  Location: [facility];  Service: Ophthalmology;  Laterality: Right;  [place] 00:38 AP% 14.3 CDE 5.51 Fluid pack lot # [id number]   CATARACT EXTRACTION W/PHACO Left 10/11/2016   Procedure: CATARACT EXTRACTION PHACO AND INTRAOCULAR LENS PLACEMENT (IOC);  Surgeon: Galen Manila, MD;  Location: [facility];  Service: Ophthalmology;  Laterality: Left;  [place] 00:30 AP% 11.3 CDE 3.50 fluid pack lot # [id number]   COLONOSCOPY WITH PROPOFOL N/A 09/30/2014   Procedure: COLONOSCOPY WITH PROPOFOL;  Surgeon: Earline Mayotte, MD;  Location: [facility];  Service: Endoscopy;  Laterality: N/A;   COLONOSCOPY WITH PROPOFOL N/A 03/24/2021   Procedure: COLONOSCOPY WITH PROPOFOL;  Surgeon: Toney Reil, MD;  Location: [facility] END[facility]COPY;  Service: Gastroenterology;  Laterality: N/A;   ESOPHAGOGASTRODUODENOSCOPY N/A 09/30/2014   Procedure: ESOPHAGOGASTRODUODENOSCOPY (EGD);  Surgeon: Earline Mayotte, MD;  Location: [facility];  Service: Endoscopy;  Laterality: N/A;   ESOPHAGOGASTRODUODENOSCOPY N/A 03/24/2021   Procedure: ESOPHAGOGASTRODUODENOSCOPY (EGD);  Surgeon: Toney Reil, MD;  Location: [facility] END[facility]COPY;  Service: Gastroenterology;  Laterality: N/A;   FOOT SURGERY Bilateral 04/26/1983   KNEE SURGERY  04/25/2014    SOCIAL HISTORY: Social History   Socioeconomic History   Marital status: Single    Spouse name: Not on file   Number of children: Not on file   Years of education: Not on file   Highest education level: Not on file  Occupational History   Not on file  Tobacco Use   Smoking status: Never   Smokeless tobacco: Never  Vaping Use   Vaping Use: Never used  Substance and Sexual Activity   Alcohol use: No   Drug use: No   Sexual activity: Never  Other  Topics Concern   Not on file  Social History Narrative   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 No smoking; no alcohol. Lives in [place], [place] lives self/kitty.    Social Determinants of Health   Financial Resource Strain: Medium Risk (06/18/2021)   Overall Financial Resource Strain (CARDIA)    Difficulty of Paying Living Expenses: Somewhat hard  Food Insecurity: No Food Insecurity (04/14/2021)   Hunger Vital Sign    Worried About Running Out of Food in the Last Year: Never true    Ran Out of Food in the Last Year: Never true  Transportation Needs: No Transportation Needs (04/14/2021)   PRAPARE - Administrator, Civil Service (Medical): No    Lack of Transportation (Non-Medical): No  Physical Activity: Unknown (04/13/2020)   Exercise Vital Sign    Days of Exercise per Week: 0 days    Minutes of Exercise per Session: Not on file  Stress: No Stress Concern Present (04/14/2021)   [employer] of Occupational Health - Occupational Stress Questionnaire    Feeling of Stress : Not at all  Social Connections: Unknown (04/14/2021)   Social Connection and Isolation Panel [NHANES]    Frequency of Communication with Friends and Family: More than three times a week    Frequency of Social Gatherings with Friends and Family: More than three times a week    Attends Religious Services: More than 4 times per year    Active Member of [employer] or Organizations: Yes    Attends Banker Meetings: More than 4 times per year    Marital Status: Not on file  Intimate Partner Violence: Not At Risk (04/14/2021)   Humiliation, Afraid, Rape, and Kick questionnaire    Fear of Current or Ex-Partner: No    Emotionally Abused: No    Physically Abused: No    Sexually Abused: No    FAMILY HISTORY: Family History  Problem Relation Age of Onset   Diabetes Mother    Hyperlipidemia Mother    Heart disease Mother    Prostate cancer Neg Hx    Kidney cancer Neg Hx    Bladder Cancer Neg Hx     ALLERGIES:  has No Known Allergies.  MEDICATIONS:   Current Outpatient Medications  Medication Sig Dispense Refill   albuterol (VENTOLIN HFA) 108 (90 Base) MCG/ACT inhaler Inhale 2 puffs into the lungs every 6 (six) hours as needed for wheezing or shortness of breath. 3 each 3   amLODipine (NORVASC) 5 MG tablet TAKE 1 TABLET BY MOUTH DAILY 100 tablet 2   budesonide (PULMICORT) 0.25 MG/2ML nebulizer solution Take 2 mLs (0.25 mg total) by nebulization 2 (two) times daily. 60 mL 12   Calcium Citrate-Vitamin D (CALCIUM CITRATE + D PO) Take 1 tablet by mouth 2 (two) times daily.     citalopram (CELEXA) 20 MG tablet TAKE 1 TABLET BY MOUTH DAILY 90 tablet 3   famotidine (PEPCID) 20 MG tablet TAKE 1 TABLET BY MOUTH DAILY. BEFORE DINNER 30 tablet 11   fexofenadine (ALLEGRA) 180 MG tablet Take 180 mg by mouth daily.     Fluticasone-Umeclidin-Vilant (TRELEGY ELLIPTA) 100-62.5-25 MCG/ACT AEPB Inhale 1 puff into the lungs daily. 1 each 11   gabapentin (NEURONTIN) 100 MG capsule TAKE 1 CAPSULE BY MOUTH 3 TIMES  DAILY 300 capsule 2   guaiFENesin-codeine (CHERATUSSIN AC) 100-10 MG/5ML syrup Take 5 mLs by mouth 3 (three) times daily as needed for cough. 120 mL 0   ipratropium-albuterol (DUONEB) 0.5-2.5 (3)  MG/3ML SOLN Take 3 mLs by nebulization every 6 (six) hours as needed. 360 mL 2   levofloxacin (LEVAQUIN) 500 MG tablet Take 1 tablet (500 mg total) by mouth daily. 5 tablet 0   levothyroxine (SYNTHROID) 50 MCG tablet TAKE 1 TABLET BY MOUTH DAILY ON  EMPTY STOMACH WITH A GLASS OF  WATER AT LEAST 30 TO 60 MIN  BEFORE BREAKFAST 100 tablet 2   oxybutynin (DITROPAN-XL) 10 MG 24 hr tablet TAKE 1 TABLET BY MOUTH AT BEDTIME. 30 tablet 2   pantoprazole (PROTONIX) 40 MG tablet TAKE 1 TABLET BY MOUTH DAILY 100 tablet 2   predniSONE (DELTASONE) 10 MG tablet 6 tablets daily for 3 days, then reduce by 1 tablet daily until gone 33 tablet 0   rosuvastatin (CRESTOR) 10 MG tablet TAKE 1 TABLET BY MOUTH DAILY 100 tablet 2   solifenacin (VESICARE) 5 MG tablet Take 1 tablet (5 mg  total) by mouth daily. 30 tablet 2   traZODone (DESYREL) 50 MG tablet TAKE 1/2 TO 1 TABLET BY MOUTH AT BEDTIME AS NEEDED FOR SLEEP 90 tablet 3   vitamin B-12 (CYANOCOBALAMIN) 1000 MCG tablet Take 1,000 mcg by mouth daily.     No current facility-administered medications for this visit.      PHYSICAL EXAMINATION:   Vitals:   04/26/22 1546  BP: (!) 117/58  Pulse: 70  Resp: 20  Temp: 98.6 F (37 C)  SpO2: 100%    Filed Weights   04/26/22 1546  Weight: 169 lb 3.2 oz (76.7 kg)     Physical Exam Vitals and nursing note reviewed.  HENT:     Head: Normocephalic and atraumatic.     Mouth/Throat:     Pharynx: Oropharynx is clear.  Eyes:     Extraocular Movements: Extraocular movements intact.     Pupils: Pupils are equal, round, and reactive to light.  Cardiovascular:     Rate and Rhythm: Normal rate and regular rhythm.  Pulmonary:     Comments: Decreased breath sounds bilaterally.  Abdominal:     Palpations: Abdomen is soft.  Musculoskeletal:        General: Normal range of motion.     Cervical back: Normal range of motion.  Skin:    General: Skin is warm.  Neurological:     General: No focal deficit present.     Mental Status: She is alert and oriented to person, place, and time.  Psychiatric:        Behavior: Behavior normal.        Judgment: Judgment normal.     LABORATORY DATA:  I have reviewed the data as listed Lab Results  Component Value Date   WBC 5.6 04/01/2022   HGB 10.3 (L) 04/01/2022   HCT 31.4 (L) 04/01/2022   MCV 106.1 (H) 04/01/2022   PLT 134 (L) 04/01/2022   Recent Labs    06/16/21 0923 08/06/21 1612 08/27/21 1032 09/15/21 0758 03/23/22 1434  NA 141   < > 136 140 141  K 3.9   < > 3.8 3.9 3.8  CL 101   < > 104 103 106  CO2 32   < > 26 29 29   GLUCOSE 113*   < > 113* 105* 106*  BUN 17   < > 17 18 18   CREATININE 0.77   < > 0.80 0.92 0.78  CALCIUM 9.4   < > 9.0 9.6 8.8*  GFRNONAA >60  --  >60  --  >60  PROT 7.2  --  7.1 6.8 7.1   ALBUMIN 4.0  --  4.1 4.2 4.0  AST 15  --  15 36 11*  ALT 17  --  13 28 11   ALKPHOS 67  --  71 73 58  BILITOT 0.5  --  0.5 0.5 0.3   < > = values in this interval not displayed.     DG Chest 2 View  Result Date: 04/07/2022 CLINICAL DATA:  Productive cough for 2 days in a 72 year old female. EXAM: CHEST - 2 VIEW COMPARISON:  August 06, 2021 FINDINGS: Cardiomediastinal contours and hilar structures are stable. No lobar consolidation. No sign of pleural effusion. No pneumothorax. On limited assessment no acute skeletal process. IMPRESSION: No active cardiopulmonary disease. Electronically Signed   By: Donzetta Kohut M.D.   On: 04/07/2022 13:32   CT BONE MARROW BIOPSY & ASPIRATION  Result Date: 04/01/2022 INDICATION: macrocytic anemia EXAM: CT BONE MARROW BIOPSY AND ASPIRATION MEDICATIONS: None. ANESTHESIA/SEDATION: Moderate (conscious) sedation was employed during this procedure. A total of Versed 1 mg and Fentanyl 50 mcg was administered intravenously. Moderate Sedation Time: 12 minutes. The patient's level of consciousness and vital signs were monitored continuously by radiology nursing throughout the procedure under my direct supervision. FLUOROSCOPY TIME:  N/a COMPLICATIONS: None immediate. PROCEDURE: Informed written consent was obtained from the patient after a thorough discussion of the procedural risks, benefits and alternatives. All questions were addressed. Maximal Sterile Barrier Technique was utilized including caps, mask, sterile gowns, sterile gloves, sterile drape, hand hygiene and skin antiseptic. A timeout was performed prior to the initiation of the procedure. The patient was placed prone on the CT exam table. Limited CT of the pelvis was performed for planning purposes. Skin entry site was marked, and the overlying skin was prepped and draped in the standard sterile fashion. Local analgesia was obtained with 1% lidocaine. Using CT guidance, an 11 gauge needle was advanced just deep to  the cortex of the right posterior ilium. Subsequently, bone marrow aspiration and core biopsy were performed. Specimens were submitted to lab/pathology for handling. Hemostasis was achieved with manual pressure, and a clean dressing was placed. The patient tolerated the procedure well without immediate complication. IMPRESSION: Successful CT-guided bone marrow aspiration and core biopsy of the right posterior ilium. Electronically Signed   By: Olive Bass M.D.   On: 04/01/2022 13:16    MDS (myelodysplastic syndrome), low grade (HCC) #[FEB 2023] Mild macrocytic anemia/thrombocytopenia-hemoglobin around 11-12; platelets 127-140s.  Normal white count. December 2023-bone marrow biopsy -Hypercellular bone marrow for age with dyspoietic changes; no blasts noted. cytogenetics/FISH positive for 5 q. Deletion.  December 2023 hemoglobin 10.3; platelets 137; white count is normal.  # I reviewed the pathology/bone marrow in detail.  Patient understands disease incurable; current treatments are usually indefinite.  Reviewed the natural history of low-grade MDS-deletion overall good prognosis.   # Patient is not needing any blood transfusions.  And also as patient is not significant symptomatic I think is reasonable to continue surveillance every 3 months.  The patient continues to drop/gets more symptomatic recommend Revlimid.  I would recommend Revlimid 10 mg 3 on and 1 week off.   # DISPOSITION:  # Follow up in  3 MONTHS- - MD;labs- cbc/cmp; LDH;iron studies. Ferritin; erythropoietin levels- - Dr.B        All questions were answered. The patient knows to call the clinic with any problems, questions or concerns.      Earna Coder, MD 04/28/2022 8:30 AM

## 2022-04-26 NOTE — Assessment & Plan Note (Addendum)
#[  FEB 2023] Mild macrocytic anemia/thrombocytopenia-hemoglobin around 11-12; platelets 127-140s.  Normal white count. December 2023-bone marrow biopsy -Hypercellular bone marrow for age with dyspoietic changes; no blasts noted. cytogenetics/FISH positive for 5 q. Deletion.  December 2023 hemoglobin 10.3; platelets 137; white count is normal.  # I reviewed the pathology/bone marrow in detail.  Patient understands disease incurable; current treatments are usually indefinite.  Reviewed the natural history of low-grade MDS-deletion overall good prognosis.   # Patient is not needing any blood transfusions.  And also as patient is not significant symptomatic I think is reasonable to continue surveillance every 3 months.  The patient continues to drop/gets more symptomatic recommend Revlimid.  I would recommend Revlimid 10 mg 3 on and 1 week off.   # DISPOSITION:  # Follow up in  3 MONTHS- - MD;labs- cbc/cmp; LDH;iron studies. Ferritin; erythropoietin levels- - Dr.B    

## 2022-04-26 NOTE — Progress Notes (Unsigned)
Patient has no concerns today. 

## 2022-04-27 ENCOUNTER — Telehealth: Payer: Self-pay | Admitting: Internal Medicine

## 2022-04-27 NOTE — Telephone Encounter (Signed)
Pt stayed that she had a bone narrow done yesterday and its very concern about the results(low grade NDS). And would like for Dr. Derrel Nip to take a look at them.

## 2022-04-28 ENCOUNTER — Encounter (HOSPITAL_COMMUNITY): Payer: Self-pay | Admitting: Internal Medicine

## 2022-04-28 DIAGNOSIS — D469 Myelodysplastic syndrome, unspecified: Secondary | ICD-10-CM | POA: Diagnosis not present

## 2022-04-28 NOTE — Telephone Encounter (Signed)
Results available in chart.

## 2022-04-28 NOTE — Telephone Encounter (Signed)
LMTCB. Need to schedule pt an appt to discuss results with Dr. Derrel Nip.

## 2022-04-29 NOTE — Telephone Encounter (Signed)
Spoke with pt and she stated that she called yesterday and scheduled a follow up appt.

## 2022-05-04 ENCOUNTER — Other Ambulatory Visit: Payer: Self-pay | Admitting: Internal Medicine

## 2022-05-05 ENCOUNTER — Telehealth: Payer: Self-pay | Admitting: Internal Medicine

## 2022-05-05 DIAGNOSIS — E785 Hyperlipidemia, unspecified: Secondary | ICD-10-CM

## 2022-05-05 MED ORDER — ROSUVASTATIN CALCIUM 10 MG PO TABS: 10.0000 mg | ORAL_TABLET | Freq: Every day | ORAL | 2 refills | Status: DC

## 2022-05-05 MED ORDER — GABAPENTIN 100 MG PO CAPS: 100.0000 mg | ORAL_CAPSULE | Freq: Three times a day (TID) | ORAL | 2 refills | Status: DC

## 2022-05-05 NOTE — Telephone Encounter (Signed)
Medication sent to [facility].

## 2022-05-05 NOTE — Telephone Encounter (Signed)
Pt need a refill on gabapentin and rosuvastatin sent to [facility]

## 2022-05-11 DIAGNOSIS — M5451 Vertebrogenic low back pain: Secondary | ICD-10-CM | POA: Diagnosis not present

## 2022-05-11 DIAGNOSIS — M4322 Fusion of spine, cervical region: Secondary | ICD-10-CM | POA: Diagnosis not present

## 2022-05-11 DIAGNOSIS — M5412 Radiculopathy, cervical region: Secondary | ICD-10-CM | POA: Diagnosis not present

## 2022-05-11 DIAGNOSIS — M542 Cervicalgia: Secondary | ICD-10-CM | POA: Diagnosis not present

## 2022-05-12 ENCOUNTER — Ambulatory Visit (INDEPENDENT_AMBULATORY_CARE_PROVIDER_SITE_OTHER): Payer: Medicare Other

## 2022-05-12 VITALS — Ht 63.0 in | Wt 169.0 lb

## 2022-05-12 DIAGNOSIS — Z0000 Encounter for general adult medical examination without abnormal findings: Secondary | ICD-10-CM | POA: Diagnosis not present

## 2022-05-12 DIAGNOSIS — Z1231 Encounter for screening mammogram for malignant neoplasm of breast: Secondary | ICD-10-CM | POA: Diagnosis not present

## 2022-05-12 NOTE — Progress Notes (Signed)
Subjective:   Sarah [name] is a 72 y.o. female who presents for Medicare Annual (Subsequent) preventive examination.  Review of Systems    No ROS.  Medicare Wellness Virtual Visit.  Visual/audio telehealth visit, UTA vital signs.   See social history for additional risk factors.   Cardiac Risk Factors include: advanced age (>61mn, >>9women)     Objective:    Today's Vitals   05/12/22 1434  Weight: 169 lb (76.7 kg)  Height: '5\' 3"'$  (1.6 m)   Body mass index is 29.94 kg/m.     05/12/2022    2:39 PM 04/26/2022    3:47 PM 11/10/2021   11:01 AM 11/03/2021    9:22 AM 08/27/2021   10:46 AM 06/16/2021    9:37 AM 04/14/2021   10:36 AM  Advanced Directives  Does Patient Have a Medical Advance Directive? No Yes No No No No No  Does patient want to make changes to medical advance directive? No - Patient declined No - Patient declined   No - Patient declined    Would patient like information on creating a medical advance directive?   No - Patient declined   No - Patient declined No - Patient declined    Current Medications (verified) Outpatient Encounter Medications as of 05/12/2022  Medication Sig   albuterol (VENTOLIN HFA) 108 (90 Base) MCG/ACT inhaler Inhale 2 puffs into the lungs every 6 (six) hours as needed for wheezing or shortness of breath.   amLODipine (NORVASC) 5 MG tablet TAKE 1 TABLET BY MOUTH DAILY   budesonide (PULMICORT) 0.25 MG/2ML nebulizer solution Take 2 mLs (0.25 mg total) by nebulization 2 (two) times daily.   Calcium Citrate-Vitamin D (CALCIUM CITRATE + D PO) Take 1 tablet by mouth 2 (two) times daily.   citalopram (CELEXA) 20 MG tablet TAKE 1 TABLET BY MOUTH DAILY   famotidine (PEPCID) 20 MG tablet TAKE 1 TABLET BY MOUTH DAILY. BEFORE DINNER   fexofenadine (ALLEGRA) 180 MG tablet Take 180 mg by mouth daily.   Fluticasone-Umeclidin-Vilant (TRELEGY ELLIPTA) 100-62.5-25 MCG/ACT AEPB Inhale 1 puff into the lungs daily.   gabapentin (NEURONTIN) 100 MG capsule Take 1  capsule (100 mg total) by mouth 3 (three) times daily.   guaiFENesin-codeine (CHERATUSSIN AC) 100-10 MG/5ML syrup Take 5 mLs by mouth 3 (three) times daily as needed for cough.   ipratropium-albuterol (DUONEB) 0.5-2.5 (3) MG/3ML SOLN Take 3 mLs by nebulization every 6 (six) hours as needed.   levofloxacin (LEVAQUIN) 500 MG tablet Take 1 tablet (500 mg total) by mouth daily.   levothyroxine (SYNTHROID) 50 MCG tablet TAKE 1 TABLET BY MOUTH DAILY ON  EMPTY STOMACH WITH A GLASS OF  WATER AT LEAST 30 TO 60 MIN  BEFORE BREAKFAST   oxybutynin (DITROPAN-XL) 10 MG 24 hr tablet TAKE 1 TABLET BY MOUTH AT BEDTIME.   pantoprazole (PROTONIX) 40 MG tablet TAKE 1 TABLET BY MOUTH DAILY   predniSONE (DELTASONE) 10 MG tablet 6 tablets daily for 3 days, then reduce by 1 tablet daily until gone   rosuvastatin (CRESTOR) 10 MG tablet Take 1 tablet (10 mg total) by mouth daily.   solifenacin (VESICARE) 5 MG tablet TAKE ONE TABLET BY MOUTH EVERY DAY   traZODone (DESYREL) 50 MG tablet TAKE 1/2 TO 1 TABLET BY MOUTH AT BEDTIME AS NEEDED FOR SLEEP   vitamin B-12 (CYANOCOBALAMIN) 1000 MCG tablet Take 1,000 mcg by mouth daily.   No facility-administered encounter medications on file as of 05/12/2022.    Allergies (verified) Patient  has no known allergies.   History: Past Medical History:  Diagnosis Date   Arthritis    Asthma    Depression    Dyspnea    with heat   Environmental and seasonal allergies    GERD (gastroesophageal reflux disease)    Hypertension    Hypothyroidism    Presence of dental prosthetic device    Dental implants   Wheezing    Past Surgical History:  Procedure Laterality Date   ABDOMINAL HYSTERECTOMY  04/26/1987   ANTERIOR CERVICAL DECOMPRESSION/DISCECTOMY FUSION 4 LEVELS N/A 11/10/2021   Procedure: C3-7 ANTERIOR CERVICAL DISCECTOMY AND FUSION (GLOBUS FORGE);  Surgeon: Meade Maw, MD;  Location: [facility];  Service: Neurosurgery;  Laterality: N/A;   BROW LIFT Bilateral 02/07/2020    Procedure: BLEPHAROPLASTY UPPER EYELID; W/EXCESS SKIN BROW PTOSIS REPAIR BILATERAL;  Surgeon: Karle Starch, MD;  Location: [place];  Service: Ophthalmology;  Laterality: Bilateral;   CATARACT EXTRACTION W/PHACO Right 09/13/2016   Procedure: CATARACT EXTRACTION PHACO AND INTRAOCULAR LENS PLACEMENT (IOC);  Surgeon: Birder Robson, MD;  Location: [facility];  Service: Ophthalmology;  Laterality: Right;  [place] 00:38 AP% 14.3 CDE 5.51 Fluid pack lot # [id number]   CATARACT EXTRACTION W/PHACO Left 10/11/2016   Procedure: CATARACT EXTRACTION PHACO AND INTRAOCULAR LENS PLACEMENT (IOC);  Surgeon: Birder Robson, MD;  Location: [facility];  Service: Ophthalmology;  Laterality: Left;  [place] 00:30 AP% 11.3 CDE 3.50 fluid pack lot # [id number]   COLONOSCOPY WITH PROPOFOL N/A 09/30/2014   Procedure: COLONOSCOPY WITH PROPOFOL;  Surgeon: Robert Bellow, MD;  Location: [facility] ENDOSCOPY;  Service: Endoscopy;  Laterality: N/A;   COLONOSCOPY WITH PROPOFOL N/A 03/24/2021   Procedure: COLONOSCOPY WITH PROPOFOL;  Surgeon: Lin Landsman, MD;  [facility];  Service: Gastroenterology;  Laterality: N/A;   ESOPHAGOGASTRODUODENOSCOPY N/A 09/30/2014   Procedure: ESOPHAGOGASTRODUODENOSCOPY (EGD);  Surgeon: Robert Bellow, MD;  Location: [facility] ENDOSCOPY;  Service: Endoscopy;  Laterality: N/A;   ESOPHAGOGASTRODUODENOSCOPY N/A 03/24/2021   Procedure: ESOPHAGOGASTRODUODENOSCOPY (EGD);  Surgeon: Lin Landsman, MD;  Location: [facility] ENDOSCOPY;  Service: Gastroenterology;  Laterality: N/A;   FOOT SURGERY Bilateral 04/26/1983   KNEE SURGERY  04/25/2014   Family History  Problem Relation Age of Onset   Diabetes Mother    Hyperlipidemia Mother    Heart disease Mother    Prostate cancer Neg Hx    Kidney cancer Neg Hx    Bladder Cancer Neg Hx    Social History   Socioeconomic History   Marital status: Single    Spouse name: Not on file   Number of children: Not on file   Years of education:  Not on file   Highest education level: Not on file  Occupational History   Not on file  Tobacco Use   Smoking status: Never   Smokeless tobacco: Never  Vaping Use   Vaping Use: Never used  Substance and Sexual Activity   Alcohol use: No   Drug use: No   Sexual activity: Never  Other Topics Concern   Not on file  Social History Narrative   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 No smoking; no alcohol. Lives in [place], [place] lives self/kitty.    Social Determinants of Health   Financial Resource Strain: Low Risk  (05/12/2022)   Overall Financial Resource Strain (CARDIA)    Difficulty of Paying Living Expenses: Not very hard  Food Insecurity: No Food Insecurity (05/12/2022)   Hunger Vital Sign    Worried About Running Out of Food in  the Last Year: Never true    [place] in the Last Year: Never true  Transportation Needs: No Transportation Needs (05/12/2022)   PRAPARE - Hydrologist (Medical): No    Lack of Transportation (Non-Medical): No  Physical Activity: Insufficiently Active (05/12/2022)   Exercise Vital Sign    Days of Exercise per Week: 1 day    Minutes of Exercise per Session: 60 min  Stress: No Stress Concern Present (05/12/2022)   [facility]    Feeling of Stress : Not at all  Social Connections: Unknown (05/12/2022)   Social Connection and Isolation Panel [NHANES]    Frequency of Communication with Friends and Family: More than three times a week    Frequency of Social Gatherings with Friends and Family: More than three times a week    Attends Religious Services: More than 4 times per year    Active Member of [employer] or Organizations: Yes    Attends Music therapist: More than 4 times per year    Marital Status: Not on file    Tobacco Counseling Counseling given: Not Answered   Clinical Intake:  Pre-visit preparation completed: Yes         Diabetes: No  How often do you need to have someone help you when you read instructions, pamphlets, or other written materials from your doctor or pharmacy?: 1 - Never   Interpreter Needed?: No      Activities of Daily Living    05/12/2022    2:41 PM 04/01/2022    9:19 AM  In your present state of health, do you have any difficulty performing the following activities:  Hearing? 0 0  Vision? 0 0  Difficulty concentrating or making decisions? 0 0  Walking or climbing stairs? 0 0  Dressing or bathing? 0 0  Doing errands, shopping? 0   Preparing Food and eating ? N   Using the Toilet? N   In the past six months, have you accidently leaked urine? N   Do you have problems with loss of bowel control? N   Managing your Medications? N   Managing your Finances? N   Housekeeping or managing your Housekeeping? N     Patient Care Team: Crecencio Mc, MD as PCP - General (Internal Medicine) Crecencio Mc, MD (Internal Medicine) Bary Castilla Forest Gleason, MD (General Surgery) Cammie Sickle, MD as Consulting Physician (Hematology and Oncology)  Indicate any recent Medical Services you may have received from other than [facility] providers in the past year (date may be approximate).     Assessment:   This is a routine wellness examination for Fonnie.  I connected with  [name] on 05/12/22 by a audio enabled telemedicine application and verified that I am speaking with the correct person using two identifiers.  Patient Location: Home  Provider Location: Office/Clinic  I discussed the limitations of evaluation and management by telemedicine. The patient expressed understanding and agreed to proceed.   Hearing/Vision screen Hearing Screening - Comments:: Patient is able to hear conversational tones without difficulty.  No issues reported.  Vision Screening - Comments:: Follow[facility]  Wears corrective lenses.    Dietary issues and exercise activities  discussed: Current Exercise Habits: Home exercise routine, Time (Minutes): 60, Frequency (Times/Week): 1, Weekly Exercise (Minutes/Week): 60, Intensity: Mild   Goals Addressed  This Visit's Progress     Patient Stated     Increase physical activity (pt-stated)        Walk for exercise, as tolerated       Depression Screen    05/12/2022    2:38 PM 02/07/2022    4:55 PM 12/22/2021    4:36 PM 09/22/2021    1:57 PM 08/06/2021    3:18 PM 06/07/2021    2:00 PM 04/14/2021   10:35 AM  PHQ 2/9 Scores  PHQ - 2 Score 0 0 0 0 0 0 0  PHQ- 9 Score  0 3 0  0     Fall Risk    05/12/2022    2:41 PM 02/07/2022    4:55 PM 12/22/2021    4:36 PM 09/22/2021    1:29 PM 08/31/2021   10:33 AM  Fall Risk   Falls in the past year? '1 1 1 1 1  '$ Number falls in past yr: 0 '1 1 1 1  '$ Injury with Fall? 0 1 1 0 0  Risk for fall due to :  History of fall(s) History of fall(s) History of fall(s) History of fall(s)  Follow up Falls evaluation completed;Falls prevention discussed Falls evaluation completed Falls evaluation completed Falls evaluation completed Falls evaluation completed    FALL RISK PREVENTION PERTAINING TO THE HOME: Home free of loose throw rugs in walkways, pet beds, electrical cords, etc? Yes  Adequate lighting in your home to reduce risk of falls? Yes   ASSISTIVE DEVICES UTILIZED TO PREVENT FALLS: Life alert? No  Use of a cane, walker or w/c? No  Grab bars in the bathroom? Yes  Shower chair or bench in shower? No  Comfort chair height toilet? Yes   TIMED UP AND GO: Was the test performed? No .   Cognitive Function:    04/09/2018    9:59 AM 09/01/2014    1:36 PM  MMSE - Mini Mental State Exam  Orientation to time 4 5  Orientation to time comments Difficulty drawing clock face with time.    Orientation to Place 5 5  Registration 3 3  Attention/ Calculation 2 5  Attention/Calculation-comments Difficulty with calculation   Recall 1 3  Recall-comments 1 out of 3  recall   Language- name 2 objects 2 2  Language- repeat 1 1  Language- follow 3 step command 3 3  Language- read & follow direction 1 1  Write a sentence 0 1  Write a sentence-comments Difficulty completing sentence.    Copy design 1 1  Total score 23 30        05/12/2022    2:42 PM 04/14/2021   10:38 AM 04/13/2020   10:54 AM 04/11/2019   11:21 AM  6CIT Screen  What Year? 0 points 0 points 0 points 0 points  What month? 0 points 0 points 0 points 0 points  What time? 0 points 0 points 0 points 0 points  Count back from 20 0 points 0 points 0 points 0 points  Months in reverse 0 points 0 points 0 points 0 points  Repeat phrase 2 points 0 points 4 points 0 points  Total Score 2 points 0 points 4 points 0 points    Immunizations Immunization History  Administered Date(s) Administered   Fluad Quad(high Dose 65+) 12/06/2018   Influenza, High Dose Seasonal PF 04/22/2016, 06/05/2017   Influenza,inj,Quad PF,6+ Mos 04/26/2013, 01/30/2015   Influenza-Unspecified 01/24/2018, 12/06/2018, 01/02/2020, 04/02/2021, 02/03/2022   Moderna Covid-19 Vaccine Bivalent Booster  11yr & up 02/05/2021, 02/03/2022   Moderna Sars-Covid-2 Vaccination 06/20/2019, 07/22/2019, 02/14/2020   PNEUMOCOCCAL CONJUGATE-20 11/09/2020   Pneumococcal Conjugate-13 09/01/2014, 01/24/2018   Pneumococcal Polysaccharide-23 04/26/2013, 02/28/2019   Tdap 06/18/2014   Zoster Recombinat (Shingrix) 07/08/2017, 09/22/2017   Screening Tests Health Maintenance  Topic Date Due   MAMMOGRAM  05/10/2022   COVID-19 Vaccine (6 - 2023-24 season) 05/28/2022 (Originally 03/31/2022)   Medicare Annual Wellness (AWV)  05/13/2023   COLONOSCOPY (Pts 45-433yrInsurance coverage will need to be confirmed)  03/24/2024   DTaP/Tdap/Td (2 - Td or Tdap) 06/18/2024   Pneumonia Vaccine 6567Years old  Completed   INFLUENZA VACCINE  Completed   DEXA SCAN  Completed   Hepatitis C Screening  Completed   Zoster Vaccines- Shingrix  Completed   HPV  VACCINES  Aged Out    Health Maintenance Health Maintenance Due  Topic Date Due   MAMMOGRAM  05/10/2022    Mammogram- ordered. Patient aware to call [phone] scheduling.   Lung Cancer Screening: (Low Dose CT Chest recommended if Age 429-80ears, 30 pack-year currently smoking OR have quit w/in 15years.) does not qualify.   Hepatitis C Screening: Completed 01/2021.  Vision Screening: Recommended annual ophthalmology exams for early detection of glaucoma and other disorders of the eye.  Dental Screening: Recommended annual dental exams for proper oral hygiene  Community Resource Referral / Chronic Care Management: CRR required this visit?  No   CCM required this visit?  No      Plan:     I have personally reviewed and noted the following in the patient's chart:   Medical and social history Use of alcohol, tobacco or illicit drugs  Current medications and supplements including opioid prescriptions. Patient is not currently taking opioid prescriptions. Functional ability and status Nutritional status Physical activity Advanced directives List of other physicians Hospitalizations, surgeries, and ER visits in previous 12 months Vitals Screenings to include cognitive, depression, and falls Referrals and appointments  In addition, I have reviewed and discussed with patient certain preventive protocols, quality metrics, and best practice recommendations. A written personalized care plan for preventive services as well as general preventive health recommendations were provided to patient.     DeLeta JunglingLPN   05/02/25/0786

## 2022-05-12 NOTE — Patient Instructions (Addendum)
Sarah [name] , Thank you for taking time to come for your Medicare Wellness Visit. I appreciate your ongoing commitment to your health goals. Please review the following plan we discussed and let me know if I can assist you in the future.   These are the goals we discussed:  Goals Addressed               This Visit's Progress     Patient Stated     Increase physical activity (pt-stated)        Walk for exercise, as tolerated         This is a list of the screening recommended for you and due dates:  Health Maintenance  Topic Date Due   Mammogram  05/10/2022   COVID-19 Vaccine (6 - 2023-24 season) 05/28/2022*   Medicare Annual Wellness Visit  05/13/2023   Colon Cancer Screening  03/24/2024   DTaP/Tdap/Td vaccine (2 - Td or Tdap) 06/18/2024   Pneumonia Vaccine  Completed   Flu Shot  Completed   DEXA scan (bone density measurement)  Completed   Hepatitis C Screening: USPSTF Recommendation to screen - Ages 28-79 yo.  Completed   Zoster (Shingles) Vaccine  Completed   HPV Vaccine  Aged Out  *Topic was postponed. The date shown is not the original due date.   Conditions/risks identified: none new  Next appointment: Follow up in one year for your annual wellness visit    Preventive Care 65 Years and Older, Female Preventive care refers to lifestyle choices and visits with your health care provider that can promote health and wellness. What does preventive care include? A yearly physical exam. This is also called an annual well check. Dental exams once or twice a year. Routine eye exams. Ask your health care provider how often you should have your eyes checked. Personal lifestyle choices, including: Daily care of your teeth and gums. Regular physical activity. Eating a healthy diet. Avoiding tobacco and drug use. Limiting alcohol use. Practicing safe sex. Taking low-dose aspirin every day. Taking vitamin and mineral supplements as recommended by your health care  provider. What happens during an annual well check? The services and screenings done by your health care provider during your annual well check will depend on your age, overall health, lifestyle risk factors, and family history of disease. Counseling  Your health care provider may ask you questions about your: Alcohol use. Tobacco use. Drug use. Emotional well-being. Home and relationship well-being. Sexual activity. Eating habits. History of falls. Memory and ability to understand (cognition). Work and work Statistician. Reproductive health. Screening  You may have the following tests or measurements: Height, weight, and BMI. Blood pressure. Lipid and cholesterol levels. These may be checked every 5 years, or more frequently if you are over 67 years old. Skin check. Lung cancer screening. You may have this screening every year starting at age 20 if you have a 30-pack-year history of smoking and currently smoke or have quit within the past 15 years. Fecal occult blood test (FOBT) of the stool. You may have this test every year starting at age 78. Flexible sigmoidoscopy or colonoscopy. You may have a sigmoidoscopy every 5 years or a colonoscopy every 10 years starting at age 5. Hepatitis C blood test. Hepatitis B blood test. Sexually transmitted disease (STD) testing. Diabetes screening. This is done by checking your blood sugar (glucose) after you have not eaten for a while (fasting). You may have this done every 1-3 years. Bone density scan. This  is done to screen for osteoporosis. You may have this done starting at age 87. Mammogram. This may be done every 1-2 years. Talk to your health care provider about how often you should have regular mammograms. Talk with your health care provider about your test results, treatment options, and if necessary, the need for more tests. Vaccines  Your health care provider may recommend certain vaccines, such as: Influenza vaccine. This is  recommended every year. Tetanus, diphtheria, and acellular pertussis (Tdap, Td) vaccine. You may need a Td booster every 10 years. Zoster vaccine. You may need this after age 71. Pneumococcal 13-valent conjugate (PCV13) vaccine. One dose is recommended after age 63. Pneumococcal polysaccharide (PPSV23) vaccine. One dose is recommended after age 26. Talk to your health care provider about which screenings and vaccines you need and how often you need them. This information is not intended to replace advice given to you by your health care provider. Make sure you discuss any questions you have with your health care provider. Document Released: 05/08/2015 Document Revised: 12/30/2015 Document Reviewed: 02/10/2015 Elsevier Interactive Patient Education  2017 [place] Prevention in the Home Falls can cause injuries. They can happen to people of all ages. There are many things you can do to make your home safe and to help prevent falls. What can I do on the outside of my home? Regularly fix the edges of walkways and driveways and fix any cracks. Remove anything that might make you trip as you walk through a door, such as a raised step or threshold. Trim any bushes or trees on the path to your home. Use bright outdoor lighting. Clear any walking paths of anything that might make someone trip, such as rocks or tools. Regularly check to see if handrails are loose or broken. Make sure that both sides of any steps have handrails. Any raised decks and porches should have guardrails on the edges. Have any leaves, snow, or ice cleared regularly. Use sand or salt on walking paths during winter. Clean up any spills in your garage right away. This includes oil or grease spills. What can I do in the bathroom? Use night lights. Install grab bars by the toilet and in the tub and shower. Do not use towel bars as grab bars. Use non-skid mats or decals in the tub or shower. If you need to sit down in  the shower, use a plastic, non-slip stool. Keep the floor dry. Clean up any water that spills on the floor as soon as it happens. Remove soap buildup in the tub or shower regularly. Attach bath mats securely with double-sided non-slip rug tape. Do not have throw rugs and other things on the floor that can make you trip. What can I do in the bedroom? Use night lights. Make sure that you have a light by your bed that is easy to reach. Do not use any sheets or blankets that are too big for your bed. They should not hang down onto the floor. Have a firm chair that has side arms. You can use this for support while you get dressed. Do not have throw rugs and other things on the floor that can make you trip. What can I do in the kitchen? Clean up any spills right away. Avoid walking on wet floors. Keep items that you use a lot in easy-to-reach places. If you need to reach something above you, use a strong step stool that has a grab bar. Keep electrical cords out  of the way. Do not use floor polish or wax that makes floors slippery. If you must use wax, use non-skid floor wax. Do not have throw rugs and other things on the floor that can make you trip. What can I do with my stairs? Do not leave any items on the stairs. Make sure that there are handrails on both sides of the stairs and use them. Fix handrails that are broken or loose. Make sure that handrails are as long as the stairways. Check any carpeting to make sure that it is firmly attached to the stairs. Fix any carpet that is loose or worn. Avoid having throw rugs at the top or bottom of the stairs. If you do have throw rugs, attach them to the floor with carpet tape. Make sure that you have a light switch at the top of the stairs and the bottom of the stairs. If you do not have them, ask someone to add them for you. What else can I do to help prevent falls? Wear shoes that: Do not have high heels. Have rubber bottoms. Are comfortable  and fit you well. Are closed at the toe. Do not wear sandals. If you use a stepladder: Make sure that it is fully opened. Do not climb a closed stepladder. Make sure that both sides of the stepladder are locked into place. Ask someone to hold it for you, if possible. Clearly mark and make sure that you can see: Any grab bars or handrails. First and last steps. Where the edge of each step is. Use tools that help you move around (mobility aids) if they are needed. These include: Canes. Walkers. Scooters. Crutches. Turn on the lights when you go into a dark area. Replace any light bulbs as soon as they burn out. Set up your furniture so you have a clear path. Avoid moving your furniture around. If any of your floors are uneven, fix them. If there are any pets around you, be aware of where they are. Review your medicines with your doctor. Some medicines can make you feel dizzy. This can increase your chance of falling. Ask your doctor what other things that you can do to help prevent falls. This information is not intended to replace advice given to you by your health care provider. Make sure you discuss any questions you have with your health care provider. Document Released: 02/05/2009 Document Revised: 09/17/2015 Document Reviewed: 05/16/2014 Elsevier Interactive Patient Education  2017 [employer].

## 2022-05-18 ENCOUNTER — Encounter: Payer: Self-pay | Admitting: Internal Medicine

## 2022-05-18 ENCOUNTER — Ambulatory Visit (INDEPENDENT_AMBULATORY_CARE_PROVIDER_SITE_OTHER): Payer: Medicare Other | Admitting: Internal Medicine

## 2022-05-18 VITALS — BP 138/66 | HR 77 | Temp 98.3°F | Ht 63.0 in | Wt 172.2 lb

## 2022-05-18 DIAGNOSIS — R0683 Snoring: Secondary | ICD-10-CM

## 2022-05-18 DIAGNOSIS — N3090 Cystitis, unspecified without hematuria: Secondary | ICD-10-CM | POA: Diagnosis not present

## 2022-05-18 DIAGNOSIS — D46Z Other myelodysplastic syndromes: Secondary | ICD-10-CM

## 2022-05-18 DIAGNOSIS — R5383 Other fatigue: Secondary | ICD-10-CM | POA: Diagnosis not present

## 2022-05-18 DIAGNOSIS — M542 Cervicalgia: Secondary | ICD-10-CM | POA: Diagnosis not present

## 2022-05-18 DIAGNOSIS — N3281 Overactive bladder: Secondary | ICD-10-CM

## 2022-05-18 DIAGNOSIS — D696 Thrombocytopenia, unspecified: Secondary | ICD-10-CM | POA: Diagnosis not present

## 2022-05-18 DIAGNOSIS — M4802 Spinal stenosis, cervical region: Secondary | ICD-10-CM

## 2022-05-18 DIAGNOSIS — R102 Pelvic and perineal pain: Secondary | ICD-10-CM | POA: Diagnosis not present

## 2022-05-18 DIAGNOSIS — M5412 Radiculopathy, cervical region: Secondary | ICD-10-CM | POA: Diagnosis not present

## 2022-05-18 DIAGNOSIS — M4322 Fusion of spine, cervical region: Secondary | ICD-10-CM | POA: Diagnosis not present

## 2022-05-18 DIAGNOSIS — G4710 Hypersomnia, unspecified: Secondary | ICD-10-CM

## 2022-05-18 DIAGNOSIS — M5451 Vertebrogenic low back pain: Secondary | ICD-10-CM | POA: Diagnosis not present

## 2022-05-18 NOTE — Patient Instructions (Signed)
You have not followed up with your pulmonologist or your urologist in over a year  Dr Erlene Quan is your urologist  Dr Patsey Berthold is your pulmonoloist  I have ordered a pulmonolgy referral for the purpose of getting a SLEEP STUDY

## 2022-05-18 NOTE — Assessment & Plan Note (Signed)
Secondary to MDS low grade by recent BM biopsy.  Continue surveillance q 3 months   Lab Results  Component Value Date   WBC 5.6 04/01/2022   HGB 10.3 (L) 04/01/2022   HCT 31.4 (L) 04/01/2022   MCV 106.1 (H) 04/01/2022   PLT 134 (L) 04/01/2022

## 2022-05-18 NOTE — Assessment & Plan Note (Signed)
Continue vesicare.  Reviewed urology workup in Nov 2022 for  ultrasoudn sggestive of hydronephrosis:,  no cause found , determined to be inaccurate diagnosis by urology

## 2022-05-18 NOTE — Assessment & Plan Note (Signed)
Checking UA and culture.  Emptying normally   (bladder scan done Nov 22  normal 16 ml )

## 2022-05-18 NOTE — Progress Notes (Addendum)
Subjective:  Patient ID: Sarah [name], female    DOB: [birth year]  Age: 72 y.o. MRN: [id number]  CC: The primary encounter diagnosis was MDS (myelodysplastic syndrome), low grade ([place]). Diagnoses of Thrombocytopenia ([place]), Other fatigue, Suprapubic cramping, Hypersomnia, Habitual snoring, Overactive bladder, Spinal stenosis, cervical region, and Cystitis were also pertinent to this visit.   HPI TRISA CRANOR presents for  Chief Complaint  Patient presents with   Discuss bone marrow biopsy results    1) persistent  fatigue, excessive.  Falling asleep  during the day , has occurred twice while driving .  Averages 7 hrs of sleep,  feels rested but starts feeling tired about an hour after she is up.  Marland Kitchen No prior sleep tudy    2) Had RSV in mid December .  Cough lasted  at last 3 weeks . Still coughing when supine  Asthma:  has not had follow up with Dr  Dorothe Pea since Jan 2022   PfTs mild asthma.  Using Triliegy once daily. Has not used  albuterol   3) Bladder SPASMS.  .  Has been having suprapubic cramping since Friday  that has been intermittent. Frequency improving with vesciare .  has not seen Urology [facility] ince Nov 2022  (referred for hydronephrosis . Bladder scan 16 ml per review of notes)   4) Review of bone marrow biopsy done in December by Dr Rogue Bussing for evaluation of macrocytic  anemia /thrombocytopenia.  Path report concerning for low grade MDS vs medication effect    5) Had  a GI viral illness on Christmas  EVE/DAY  6)  Neck surgery Sept 2023  had a fall several months ago, after the surgery,  fell of the bed onto her head , aggravated her neck pain  saw [facility] Emms for PT still ongoing   Outpatient Medications Prior to Visit  Medication Sig Dispense Refill   albuterol (VENTOLIN HFA) 108 (90 Base) MCG/ACT inhaler Inhale 2 puffs into the lungs every 6 (six) hours as needed for wheezing or shortness of breath. 3 each 3   amLODipine (NORVASC) 5 MG tablet TAKE 1 TABLET BY  MOUTH DAILY 100 tablet 2   budesonide (PULMICORT) 0.25 MG/2ML nebulizer solution Take 2 mLs (0.25 mg total) by nebulization 2 (two) times daily. 60 mL 12   Calcium Citrate-Vitamin D (CALCIUM CITRATE + D PO) Take 1 tablet by mouth 2 (two) times daily.     citalopram (CELEXA) 20 MG tablet TAKE 1 TABLET BY MOUTH DAILY 90 tablet 3   famotidine (PEPCID) 20 MG tablet TAKE 1 TABLET BY MOUTH DAILY. BEFORE DINNER 30 tablet 11   fexofenadine (ALLEGRA) 180 MG tablet Take 180 mg by mouth daily.     Fluticasone-Umeclidin-Vilant (TRELEGY ELLIPTA) 100-62.5-25 MCG/ACT AEPB Inhale 1 puff into the lungs daily. 1 each 11   gabapentin (NEURONTIN) 100 MG capsule Take 1 capsule (100 mg total) by mouth 3 (three) times daily. 300 capsule 2   ipratropium-albuterol (DUONEB) 0.5-2.5 (3) MG/3ML SOLN Take 3 mLs by nebulization every 6 (six) hours as needed. 360 mL 2   levothyroxine (SYNTHROID) 50 MCG tablet TAKE 1 TABLET BY MOUTH DAILY ON  EMPTY STOMACH WITH A GLASS OF  WATER AT LEAST 30 TO 60 MIN  BEFORE BREAKFAST 100 tablet 2   oxybutynin (DITROPAN-XL) 10 MG 24 hr tablet TAKE 1 TABLET BY MOUTH AT BEDTIME. 30 tablet 2   pantoprazole (PROTONIX) 40 MG tablet TAKE 1 TABLET BY MOUTH DAILY 100 tablet 2   rosuvastatin (  CRESTOR) 10 MG tablet Take 1 tablet (10 mg total) by mouth daily. 100 tablet 2   solifenacin (VESICARE) 5 MG tablet TAKE ONE TABLET BY MOUTH EVERY DAY 30 tablet 2   traZODone (DESYREL) 50 MG tablet TAKE 1/2 TO 1 TABLET BY MOUTH AT BEDTIME AS NEEDED FOR SLEEP 90 tablet 3   vitamin B-12 (CYANOCOBALAMIN) 1000 MCG tablet Take 1,000 mcg by mouth daily.     guaiFENesin-codeine (CHERATUSSIN AC) 100-10 MG/5ML syrup Take 5 mLs by mouth 3 (three) times daily as needed for cough. (Patient not taking: Reported on 05/18/2022) 120 mL 0   levofloxacin (LEVAQUIN) 500 MG tablet Take 1 tablet (500 mg total) by mouth daily. (Patient not taking: Reported on 05/18/2022) 5 tablet 0   predniSONE (DELTASONE) 10 MG tablet 6 tablets daily for 3  days, then reduce by 1 tablet daily until gone (Patient not taking: Reported on 05/18/2022) 33 tablet 0   No facility-administered medications prior to visit.    Review of Systems;  Patient denies headache, fevers, malaise, unintentional weight loss, skin rash, eye pain, sinus congestion and sinus pain, sore throat, dysphagia,  hemoptysis , cough, dyspnea, wheezing, chest pain, palpitations, orthopnea, edema, abdominal pain, nausea, melena, diarrhea, constipation, flank pain, dysuria, hematuria, urinary  Frequency, nocturia, numbness, tingling, seizures,  Focal weakness, Loss of consciousness,  Tremor, insomnia, depression, anxiety, and suicidal ideation.      Objective:  BP 138/66   Pulse 77   Temp 98.3 F (36.8 C) (Oral)   Ht '5\' 3"'$  (1.6 m)   Wt 172 lb 3.2 oz (78.1 kg)   SpO2 94%   BMI 30.50 kg/m   BP Readings from Last 3 Encounters:  05/18/22 138/66  04/26/22 (!) 117/58  04/07/22 133/74    Wt Readings from Last 3 Encounters:  05/18/22 172 lb 3.2 oz (78.1 kg)  05/12/22 169 lb (76.7 kg)  04/26/22 169 lb 3.2 oz (76.7 kg)    Physical Exam Vitals reviewed.  Constitutional:      General: She is not in acute distress.    Appearance: Normal appearance. She is normal weight. She is not ill-appearing, toxic-appearing or diaphoretic.  HENT:     Head: Normocephalic.  Eyes:     General: No scleral icterus.       Right eye: No discharge.        Left eye: No discharge.     Conjunctiva/sclera: Conjunctivae normal.  Cardiovascular:     Rate and Rhythm: Normal rate and regular rhythm.     Heart sounds: Normal heart sounds.  Pulmonary:     Effort: Pulmonary effort is normal. No respiratory distress.     Breath sounds: Normal breath sounds.  Musculoskeletal:        General: Normal range of motion.  Skin:    General: Skin is warm and dry.  Neurological:     General: No focal deficit present.     Mental Status: She is alert and oriented to person, place, and time. Mental status  is at baseline.  Psychiatric:        Mood and Affect: Mood normal.        Behavior: Behavior normal.        Thought Content: Thought content normal.        Judgment: Judgment normal.     Lab Results  Component Value Date   HGBA1C 6.1 09/15/2021   HGBA1C 6.0 06/07/2021   HGBA1C 6.4 12/10/2020    Lab Results  Component Value Date  CREATININE 0.78 03/23/2022   CREATININE 0.92 09/15/2021   CREATININE 0.80 08/27/2021    Lab Results  Component Value Date   WBC 6.2 05/18/2022   HGB 11.0 (L) 05/18/2022   HCT 32.3 (L) 05/18/2022   PLT 126.0 (L) 05/18/2022   GLUCOSE 106 (H) 03/23/2022   CHOL 147 09/15/2021   TRIG 59.0 09/15/2021   HDL 64.20 09/15/2021   LDLCALC 71 09/15/2021   ALT 11 03/23/2022   AST 11 (L) 03/23/2022   NA 141 03/23/2022   K 3.8 03/23/2022   CL 106 03/23/2022   CREATININE 0.78 03/23/2022   BUN 18 03/23/2022   CO2 29 03/23/2022   TSH 1.47 09/15/2021   HGBA1C 6.1 09/15/2021   MICROALBUR <0.7 06/07/2021    DG Chest 2 View  Result Date: 04/07/2022 CLINICAL DATA:  Productive cough for 2 days in a 72 year old female. EXAM: CHEST - 2 VIEW COMPARISON:  August 06, 2021 FINDINGS: Cardiomediastinal contours and hilar structures are stable. No lobar consolidation. No sign of pleural effusion. No pneumothorax. On limited assessment no acute skeletal process. IMPRESSION: No active cardiopulmonary disease. Electronically Signed   By: Zetta Bills M.D.   On: 04/07/2022 13:32    Assessment & Plan:  .MDS (myelodysplastic syndrome), low grade [facility]) Assessment & Plan: Dr Rogue Bussing reviewed the natural history of low-grade MDS-deletion overall good prognosis.   she currently is not  needing any blood transfusions. continue surveillance every 3 months.  Ig her counts  continue to drop/gets more symptomatic he would recommend Revlimid  10 mg 3 weeks on  and 1 week off.    Thrombocytopenia ([place]) Assessment & Plan: Secondary to MDS low grade by recent BM biopsy.   Continue surveillance q 3 months   Lab Results  Component Value Date   WBC 5.6 04/01/2022   HGB 10.3 (L) 04/01/2022   HCT 31.4 (L) 04/01/2022   MCV 106.1 (H) 04/01/2022   PLT 134 (L) 04/01/2022      Other fatigue -     CBC with Differential/Platelet -     Iron, TIBC and Ferritin Panel -     B12 and Folate Panel  Suprapubic cramping Assessment & Plan: Checking UA and culture.  Emptying normally   (bladder scan done Nov 22  normal 16 ml )   Orders: -     Urinalysis, Routine w reflex microscopic -     Urine Culture  Hypersomnia Assessment & Plan: DDX INCLUDES obesity hypoventilation syndrome and OSA.  Resting sats are 94 % ,  ordering sleep study through pulmonary  Orders: -     Ambulatory referral to Pulmonology  Habitual snoring -     Ambulatory referral to Pulmonology  Overactive bladder Assessment & Plan: Continue vesicare.  Reviewed urology workup in Nov 2022 for  ultrasoudn sggestive of hydronephrosis:,  no cause found , determined to be inaccurate diagnosis by urology    Spinal stenosis, cervical region Assessment & Plan: S/p Anterior Cervical Discectomy and Fusion C3-8 including bilateral foraminotomies and end plate preparation  December 23 2021 by Meade Maw.  She had a setback after falling out of bed nto her head several  weeks ago .  Continue PT   Cystitis Assessment & Plan: Secondary to E cloacae sensitive to cipro    Other orders -     Ciprofloxacin HCl; Take 1 tablet (250 mg total) by mouth 2 (two) times daily for 3 days.  Dispense: 6 tablet; Refill: 0     I provided 30 minutes  of face-to-face time during this encounter reviewing patient's last visit with me, patient's  most recent visit with neurosurgery, pulmonollogy, and urology , recent surgical and non surgical procedures, previous  labs and imaging studies, counseling on currently addressed issues,  and post visit ordering to diagnostics and therapeutics .   Follow-up: Return in about  3 months (around 08/17/2022) for multiple issues needs follow up every 3 months .   Crecencio Mc, MD

## 2022-05-18 NOTE — Assessment & Plan Note (Signed)
DDX INCLUDES obesity hypoventilation syndrome and OSA.  Resting sats are 94 % ,  ordering sleep study through pulmonary

## 2022-05-18 NOTE — Assessment & Plan Note (Signed)
Dr Rogue Bussing reviewed the natural history of low-grade MDS-deletion overall good prognosis.   she currently is not  needing any blood transfusions. continue surveillance every 3 months.  Ig her counts  continue to drop/gets more symptomatic he would recommend Revlimid  10 mg 3 weeks on  and 1 week off.

## 2022-05-19 LAB — CBC WITH DIFFERENTIAL/PLATELET
Basophils Absolute: 0.1 10*3/uL (ref 0.0–0.1)
Basophils Relative: 1.6 % (ref 0.0–3.0)
Eosinophils Absolute: 0.1 10*3/uL (ref 0.0–0.7)
Eosinophils Relative: 1.6 % (ref 0.0–5.0)
HCT: 32.3 % — ABNORMAL LOW (ref 36.0–46.0)
Hemoglobin: 11 g/dL — ABNORMAL LOW (ref 12.0–15.0)
Lymphocytes Relative: 23.6 % (ref 12.0–46.0)
Lymphs Abs: 1.5 10*3/uL (ref 0.7–4.0)
MCHC: 34 g/dL (ref 30.0–36.0)
MCV: 106.3 fl — ABNORMAL HIGH (ref 78.0–100.0)
Monocytes Absolute: 0.2 10*3/uL (ref 0.1–1.0)
Monocytes Relative: 2.9 % — ABNORMAL LOW (ref 3.0–12.0)
Neutro Abs: 4.4 10*3/uL (ref 1.4–7.7)
Neutrophils Relative %: 70.3 % (ref 43.0–77.0)
Platelets: 126 10*3/uL — ABNORMAL LOW (ref 150.0–400.0)
RBC: 3.04 Mil/uL — ABNORMAL LOW (ref 3.87–5.11)
RDW: 14.5 % (ref 11.5–15.5)
WBC: 6.2 10*3/uL (ref 4.0–10.5)

## 2022-05-19 LAB — B12 AND FOLATE PANEL
Folate: 11.8 ng/mL (ref >5.9)
Vitamin B-12: >1500 pg/mL — ABNORMAL HIGH (ref 211–911)

## 2022-05-19 LAB — IRON,TIBC AND FERRITIN PANEL
%SAT: 26 % (calc) (ref 16–45)
Ferritin: 41 ng/mL (ref 16–288)
Iron: 76 ug/dL (ref 45–160)
TIBC: 291 mcg/dL (calc) (ref 250–450)

## 2022-05-19 LAB — URINALYSIS, ROUTINE W REFLEX MICROSCOPIC
Bilirubin Urine: NEGATIVE
Ketones, ur: NEGATIVE
Nitrite: POSITIVE — AB
Specific Gravity, Urine: 1.02 (ref 1.000–1.030)
Total Protein, Urine: NEGATIVE
Urine Glucose: NEGATIVE
Urobilinogen, UA: 0.2 (ref 0.0–1.0)
pH: 6 (ref 5.0–8.0)

## 2022-05-19 NOTE — Assessment & Plan Note (Addendum)
S/p Anterior Cervical Discectomy and Fusion C3-8 including bilateral foraminotomies and end plate preparation  December 23 2021 by Meade Maw.  She had a setback after falling out of bed nto her head several  weeks ago .  Continue PT

## 2022-05-20 ENCOUNTER — Telehealth: Payer: Self-pay

## 2022-05-20 LAB — URINE CULTURE
MICRO NUMBER:: 14467080
SPECIMEN QUALITY:: ADEQUATE

## 2022-05-20 NOTE — Telephone Encounter (Signed)
-----  Message from Crecencio Mc, MD sent at 05/19/2022  8:18 PM EST ----- Your labs are stable or normal,  but Your urine culture has not been resulted yet,  it does appear that you have an infection.  Please check your Mychart messages tomorrow, because I will likely have the results and be able to send in the correct antibiotic to your pharmacy . iF YOUR SYMPTOMS ARE SEVERE AND YOU CANNOT WAIT FOR THE CULTURE , please call the office and leave me a message and I will call in an antibiotic this afternoon..  we may have to change the antibiotic once I see the culture results.  Regards,   Deborra Medina, MD

## 2022-05-21 MED ORDER — CIPROFLOXACIN HCL 250 MG PO TABS: 250.0000 mg | ORAL_TABLET | Freq: Two times a day (BID) | ORAL | 0 refills | Status: AC

## 2022-05-21 NOTE — Assessment & Plan Note (Signed)
Secondary to E cloacae sensitive to cipro

## 2022-05-21 NOTE — Addendum Note (Signed)
Addended by: Crecencio Mc on: 05/21/2022 02:40 PM   Modules accepted: Orders

## 2022-05-25 DIAGNOSIS — M5412 Radiculopathy, cervical region: Secondary | ICD-10-CM | POA: Diagnosis not present

## 2022-05-25 DIAGNOSIS — M542 Cervicalgia: Secondary | ICD-10-CM | POA: Diagnosis not present

## 2022-05-25 DIAGNOSIS — M4322 Fusion of spine, cervical region: Secondary | ICD-10-CM | POA: Diagnosis not present

## 2022-05-25 DIAGNOSIS — M5451 Vertebrogenic low back pain: Secondary | ICD-10-CM | POA: Diagnosis not present

## 2022-06-01 DIAGNOSIS — M5412 Radiculopathy, cervical region: Secondary | ICD-10-CM | POA: Diagnosis not present

## 2022-06-01 DIAGNOSIS — M542 Cervicalgia: Secondary | ICD-10-CM | POA: Diagnosis not present

## 2022-06-01 DIAGNOSIS — M5451 Vertebrogenic low back pain: Secondary | ICD-10-CM | POA: Diagnosis not present

## 2022-06-01 DIAGNOSIS — M4322 Fusion of spine, cervical region: Secondary | ICD-10-CM | POA: Diagnosis not present

## 2022-06-02 ENCOUNTER — Ambulatory Visit: Payer: Medicare Other | Admitting: Adult Health

## 2022-06-02 ENCOUNTER — Encounter: Payer: Self-pay | Admitting: Adult Health

## 2022-06-02 VITALS — BP 104/50 | HR 76 | Temp 97.8°F | Ht 63.0 in | Wt 172.8 lb

## 2022-06-02 DIAGNOSIS — J4540 Moderate persistent asthma, uncomplicated: Secondary | ICD-10-CM

## 2022-06-02 DIAGNOSIS — R400 Somnolence: Secondary | ICD-10-CM | POA: Diagnosis not present

## 2022-06-02 DIAGNOSIS — R0683 Snoring: Secondary | ICD-10-CM

## 2022-06-02 NOTE — Patient Instructions (Addendum)
Set up for Home sleep study  Work on healthy sleep regimen  Work on healthy weight loss  Do not drive if sleepy.  Continue on Trelegy 1 puff daily , rinse after use  Albuterol inhaler As needed   Follow up in 3 months to discuss results and treatment plan .

## 2022-06-02 NOTE — Progress Notes (Signed)
$@Patientk$  ID: Sarah [name], female    DOB: Dec 15, 1950, 72 y.o.   MRN: [id number]  Chief Complaint  Patient presents with   Consult    Referring provider: Crecencio Mc, MD  HPI: 72 year old female never smoker followed for moderate persistent asthma.  Presents for sleep consult June 03, 2022 for snoring, restless sleep and daytime sleepiness  TEST/EVENTS :   06/02/2022 Sleep consult  Patient presents for a sleep consult today.  Kindly referred by her primary care provider Dr. Derrel Nip.  She is a patient of Dr. Domingo Dimes for asthma.  She complains of snoring, daytime sleepiness and restless sleep.  Says that her sleep is very fragmented and wakes up frequently.  Typically goes to sleep about midnight.  Is up 3-4 times.  Can take a long time to go to sleep.  Gets up about 10 AM.  Weight has been steady over the last 2 years with a weight at send 172 and a BMI at 30.  She has no removable dental work.  Has minimal caffeine intake.  Patient says she can take a nap anytime during the daytime.  She is a falls asleep very easily if she sits down to watch TV.  And even get sleepy with driving.  She has tried trazodone in the past for insomnia but rarely takes it.  She does take gabapentin 300 mg at bedtime for neuropathy.  Denies any history of congestive heart failure or stroke.  Is not on oxygen.  She says she is not very active she is limited by back pain with spinal stenosis.  No symptoms suspicious for cataplexy or sleep paralysis Epworth score is 17 out of 24.  Typically gets sleepy if she sits down to watch TV, drive or passenger of a car.  Patient says her asthma is been doing well lately.  She remains on Trelegy daily.  No increased albuterol use  Social history patient is single.  She does not have any children.  She is retired.  She is a never smoker.  No alcohol or drug use.  Family history significant for asthma, congestive heart failure  Past medical history significant for  hypertension, asthma, allergic rhinitis, GERD, hypothyroidism, neuropathy, spinal stenosis, overactive bladder, hyperlipidemia, myelodysplastic syndrome  Past Surgical History:  Procedure Laterality Date   ABDOMINAL HYSTERECTOMY  04/26/1987   ANTERIOR CERVICAL DECOMPRESSION/DISCECTOMY FUSION 4 LEVELS N/A 11/10/2021   Procedure: C3-7 ANTERIOR CERVICAL DISCECTOMY AND FUSION (GLOBUS FORGE);  Surgeon: Meade Maw, MD;  Location: [facility];  Service: Neurosurgery;  Laterality: N/A;   BROW LIFT Bilateral 02/07/2020   Procedure: BLEPHAROPLASTY UPPER EYELID; W/EXCESS SKIN BROW PTOSIS REPAIR BILATERAL;  Surgeon: Karle Starch, MD;  Location: [place];  Service: Ophthalmology;  Laterality: Bilateral;   CATARACT EXTRACTION W/PHACO Right 09/13/2016   Procedure: CATARACT EXTRACTION PHACO AND INTRAOCULAR LENS PLACEMENT (IOC);  Surgeon: Birder Robson, MD;  Location: [facility];  Service: Ophthalmology;  Laterality: Right;  [place] 00:38 AP% 14.3 CDE 5.51 Fluid pack lot # [id number]   CATARACT EXTRACTION W/PHACO Left 10/11/2016   Procedure: CATARACT EXTRACTION PHACO AND INTRAOCULAR LENS PLACEMENT (IOC);  Surgeon: Birder Robson, MD;  Location: [facility];  Service: Ophthalmology;  Laterality: Left;  [place] 00:30 AP% 11.3 CDE 3.50 fluid pack lot # [id number]   COLONOSCOPY WITH PROPOFOL N/A 09/30/2014   Procedure: COLONOSCOPY WITH PROPOFOL;  Surgeon: Robert Bellow, MD;  Location[facility];  Service: Endoscopy;  Laterality: N/A;   COLONOSCOPY WITH PROPOFOL N/A 03/24/2021   Procedure:  COLONOSCOPY WITH PROPOFOL;  Surgeon: Lin Landsman, MD;  Location: [facility] ENDOSCOPY;  Service: Gastroenterology;  Laterality: N/A;   ESOPHAGOGASTRODUODENOSCOPY N/A 09/30/2014   Procedure: ESOPHAGOGASTRODUODENOSCOPY (EGD);  Surgeon: Robert Bellow, MD;  Location: [facility] ENDOSCOPY;  Service: Endoscopy;  Laterality: N/A;   ESOPHAGOGASTRODUODENOSCOPY N/A 03/24/2021   Procedure: ESOPHAGOGASTRODUODENOSCOPY (EGD);   Surgeon: Lin Landsman, MD;  Location: [facility] ENDOSCOPY;  Service: Gastroenterology;  Laterality: N/A;   FOOT SURGERY Bilateral 04/26/1983   KNEE SURGERY  04/25/2014     No Known Allergies  Immunization History  Administered Date(s) Administered   Fluad Quad(high Dose 65+) 12/06/2018   Influenza, High Dose Seasonal PF 04/22/2016, 06/05/2017   Influenza,inj,Quad PF,6+ Mos 04/26/2013, 01/30/2015   Influenza-Unspecified 01/24/2018, 12/06/2018, 01/02/2020, 04/02/2021, 02/03/2022   Moderna Covid-19 Vaccine Bivalent Booster 92yr & up 02/05/2021, 02/03/2022   Moderna Sars-Covid-2 Vaccination 06/20/2019, 07/22/2019, 02/14/2020   PNEUMOCOCCAL CONJUGATE-20 11/09/2020   Pneumococcal Conjugate-13 09/01/2014, 01/24/2018   Pneumococcal Polysaccharide-23 04/26/2013, 02/28/2019   Rsv, Bivalent, Protein Subunit Rsvpref,pf ([place]ans Lance 05/26/2022   Tdap 06/18/2014, 05/26/2022   Zoster Recombinat (Shingrix) 07/08/2017, 09/22/2017    Past Medical History:  Diagnosis Date   Arthritis    Asthma    Depression    Dyspnea    with heat   Environmental and seasonal allergies    GERD (gastroesophageal reflux disease)    Hypertension    Hypothyroidism    Presence of dental prosthetic device    Dental implants   Wheezing     Tobacco History: Social History   Tobacco Use  Smoking Status Never  Smokeless Tobacco Never   Counseling given: Not Answered   Outpatient Medications Prior to Visit  Medication Sig Dispense Refill   albuterol (VENTOLIN HFA) 108 (90 Base) MCG/ACT inhaler Inhale 2 puffs into the lungs every 6 (six) hours as needed for wheezing or shortness of breath. 3 each 3   amLODipine (NORVASC) 5 MG tablet TAKE 1 TABLET BY MOUTH DAILY 100 tablet 2   budesonide (PULMICORT) 0.25 MG/2ML nebulizer solution Take 2 mLs (0.25 mg total) by nebulization 2 (two) times daily. 60 mL 12   Calcium Citrate-Vitamin D (CALCIUM CITRATE + D PO) Take 1 tablet by mouth 2 (two) times daily.      citalopram (CELEXA) 20 MG tablet TAKE 1 TABLET BY MOUTH DAILY 90 tablet 3   famotidine (PEPCID) 20 MG tablet TAKE 1 TABLET BY MOUTH DAILY. BEFORE DINNER 30 tablet 11   fexofenadine (ALLEGRA) 180 MG tablet Take 180 mg by mouth daily.     Fluticasone-Umeclidin-Vilant (TRELEGY ELLIPTA) 100-62.5-25 MCG/ACT AEPB Inhale 1 puff into the lungs daily. 1 each 11   gabapentin (NEURONTIN) 100 MG capsule Take 1 capsule (100 mg total) by mouth 3 (three) times daily. 300 capsule 2   ipratropium-albuterol (DUONEB) 0.5-2.5 (3) MG/3ML SOLN Take 3 mLs by nebulization every 6 (six) hours as needed. 360 mL 2   levothyroxine (SYNTHROID) 50 MCG tablet TAKE 1 TABLET BY MOUTH DAILY ON  EMPTY STOMACH WITH A GLASS OF  WATER AT LEAST 30 TO 60 MIN  BEFORE BREAKFAST 100 tablet 2   pantoprazole (PROTONIX) 40 MG tablet TAKE 1 TABLET BY MOUTH DAILY 100 tablet 2   rosuvastatin (CRESTOR) 10 MG tablet Take 1 tablet (10 mg total) by mouth daily. 100 tablet 2   solifenacin (VESICARE) 5 MG tablet TAKE ONE TABLET BY MOUTH EVERY DAY 30 tablet 2   traZODone (DESYREL) 50 MG tablet TAKE 1/2 TO 1 TABLET BY MOUTH AT BEDTIME AS NEEDED FOR  SLEEP 90 tablet 3   vitamin B-12 (CYANOCOBALAMIN) 1000 MCG tablet Take 1,000 mcg by mouth daily.     oxybutynin (DITROPAN-XL) 10 MG 24 hr tablet TAKE 1 TABLET BY MOUTH AT BEDTIME. (Patient not taking: Reported on 06/02/2022) 30 tablet 2   No facility-administered medications prior to visit.     Review of Systems:   Constitutional:   No  weight loss, night sweats,  Fevers, chills,  +fatigue, or  lassitude.  HEENT:   No headaches,  Difficulty swallowing,  Tooth/dental problems, or  Sore throat,                No sneezing, itching, ear ache, nasal congestion, post nasal drip,   CV:  No chest pain,  Orthopnea, PND, swelling in lower extremities, anasarca, dizziness, palpitations, syncope.   GI  No heartburn, indigestion, abdominal pain, nausea, vomiting, diarrhea, change in bowel habits, loss of appetite,  bloody stools.   Resp: No shortness of breath with exertion or at rest.  No excess mucus, no productive cough,  No non-productive cough,  No coughing up of blood.  No change in color of mucus.  No wheezing.  No chest wall deformity  Skin: no rash or lesions.  GU: no dysuria, change in color of urine, no urgency or frequency.  No flank pain, no hematuria   MS:  No joint pain or swelling.  No decreased range of motion. + back pain.    Physical Exam  BP (!) 104/50 (BP Location: Left Arm, Patient Position: Sitting, Cuff Size: Normal)   Pulse 76   Temp 97.8 F (36.6 C) (Oral)   Ht 5' 3"$  (1.6 m)   Wt 172 lb 12.8 oz (78.4 kg)   SpO2 97%   BMI 30.61 kg/m   GEN: A/Ox3; pleasant , NAD, well nourished    HEENT:  [place]/AT,    NOSE-clear, THROAT-clear, no lesions, no postnasal drip or exudate noted.  Class III MP airway  NECK:  Supple w/ fair ROM; no JVD; normal carotid impulses w/o bruits; no thyromegaly or nodules palpated; no lymphadenopathy.    RESP  Clear  P & A; w/o, wheezes/ rales/ or rhonchi. no accessory muscle use, no dullness to percussion  CARD:  RRR, no m/r/g, no peripheral edema, pulses intact, no cyanosis or clubbing.  GI:   Soft & nt; nml bowel sounds; no organomegaly or masses detected.   Musco: Warm bil, no deformities or joint swelling noted.   Neuro: alert, no focal deficits noted.    Skin: Warm, no lesions or rashes    Lab Results:  BMET   BNP No results found for: "BNP"  ProBNP No results found for: "PROBNP"  Imaging: No results found.        No data to display          No results found for: "NITRICOXIDE"      Assessment & Plan:   No problem-specific Assessment & Plan notes found for this encounter.     Rexene Edison, NP 06/02/2022

## 2022-06-03 DIAGNOSIS — R0683 Snoring: Secondary | ICD-10-CM | POA: Insufficient documentation

## 2022-06-03 NOTE — Assessment & Plan Note (Addendum)
Healthy sleep regimen discussed in detail. Will check home sleep .

## 2022-06-03 NOTE — Progress Notes (Signed)
Reviewed and agree with assessment/plan.   Chesley Mires, MD [facility] Pulmonary/Critical Care 06/03/2022, 3:23 PM Pager:  [phone]

## 2022-06-03 NOTE — Assessment & Plan Note (Signed)
Loud snoring, restless sleep, insomnia, daytime sleepiness all suspicious for underlying sleep apnea.  Will set up for a home sleep study.  Patient education given on sleep apnea  - discussed how weight can impact sleep and risk for sleep disordered breathing - discussed options to assist with weight loss: combination of diet modification, cardiovascular and strength training exercises   - had an extensive discussion regarding the adverse health consequences related to untreated sleep disordered breathing - specifically discussed the risks for hypertension, coronary artery disease, cardiac dysrhythmias, cerebrovascular disease, and diabetes - lifestyle modification discussed   - discussed how sleep disruption can increase risk of accidents, particularly when driving - safe driving practices were discussed   Plan  Patient Instructions  Set up for Home sleep study  Work on healthy sleep regimen  Work on healthy weight loss  Do not drive if sleepy.  Continue on Trelegy 1 puff daily , rinse after use  Albuterol inhaler As needed   Follow up in 3 months to discuss results and treatment plan .

## 2022-06-03 NOTE — Assessment & Plan Note (Signed)
Appears controlled.  Continue current regimen

## 2022-06-08 DIAGNOSIS — M542 Cervicalgia: Secondary | ICD-10-CM | POA: Diagnosis not present

## 2022-06-08 DIAGNOSIS — M4322 Fusion of spine, cervical region: Secondary | ICD-10-CM | POA: Diagnosis not present

## 2022-06-08 DIAGNOSIS — M5451 Vertebrogenic low back pain: Secondary | ICD-10-CM | POA: Diagnosis not present

## 2022-06-08 DIAGNOSIS — M5412 Radiculopathy, cervical region: Secondary | ICD-10-CM | POA: Diagnosis not present

## 2022-06-15 DIAGNOSIS — M542 Cervicalgia: Secondary | ICD-10-CM | POA: Diagnosis not present

## 2022-06-15 DIAGNOSIS — M5451 Vertebrogenic low back pain: Secondary | ICD-10-CM | POA: Diagnosis not present

## 2022-06-15 DIAGNOSIS — M4322 Fusion of spine, cervical region: Secondary | ICD-10-CM | POA: Diagnosis not present

## 2022-06-15 DIAGNOSIS — M5412 Radiculopathy, cervical region: Secondary | ICD-10-CM | POA: Diagnosis not present

## 2022-06-22 ENCOUNTER — Telehealth: Payer: Self-pay | Admitting: Adult Health

## 2022-06-22 DIAGNOSIS — M4322 Fusion of spine, cervical region: Secondary | ICD-10-CM | POA: Diagnosis not present

## 2022-06-22 DIAGNOSIS — M542 Cervicalgia: Secondary | ICD-10-CM | POA: Diagnosis not present

## 2022-06-22 DIAGNOSIS — M5412 Radiculopathy, cervical region: Secondary | ICD-10-CM | POA: Diagnosis not present

## 2022-06-22 DIAGNOSIS — M5451 Vertebrogenic low back pain: Secondary | ICD-10-CM | POA: Diagnosis not present

## 2022-06-22 NOTE — Telephone Encounter (Signed)
PT states she got an email from [place] she says she can not open. I don't even think we send emails;do we? Pls call PT to advise @ [phone]

## 2022-06-23 ENCOUNTER — Telehealth: Payer: Self-pay | Admitting: Internal Medicine

## 2022-06-23 DIAGNOSIS — J4540 Moderate persistent asthma, uncomplicated: Secondary | ICD-10-CM

## 2022-06-23 MED ORDER — TRELEGY ELLIPTA 100-62.5-25 MCG/ACT IN AEPB: 1.0000 | INHALATION_SPRAY | Freq: Every day | RESPIRATORY_TRACT | 11 refills | Status: DC

## 2022-06-23 NOTE — Telephone Encounter (Signed)
Spoke to pt and she stated she got an email from [place] I informed her we don't send out emails. Pt verbalized understanding. Nothing further needed.

## 2022-06-23 NOTE — Telephone Encounter (Signed)
Pt need a refill on Fluticasone-Umeclidin-Vilant [facility]

## 2022-06-23 NOTE — Telephone Encounter (Signed)
Medication has been refilled.

## 2022-07-01 DIAGNOSIS — M4322 Fusion of spine, cervical region: Secondary | ICD-10-CM | POA: Diagnosis not present

## 2022-07-01 DIAGNOSIS — M5412 Radiculopathy, cervical region: Secondary | ICD-10-CM | POA: Diagnosis not present

## 2022-07-01 DIAGNOSIS — M542 Cervicalgia: Secondary | ICD-10-CM | POA: Diagnosis not present

## 2022-07-01 DIAGNOSIS — M5451 Vertebrogenic low back pain: Secondary | ICD-10-CM | POA: Diagnosis not present

## 2022-07-06 DIAGNOSIS — M5451 Vertebrogenic low back pain: Secondary | ICD-10-CM | POA: Diagnosis not present

## 2022-07-06 DIAGNOSIS — M5412 Radiculopathy, cervical region: Secondary | ICD-10-CM | POA: Diagnosis not present

## 2022-07-06 DIAGNOSIS — M542 Cervicalgia: Secondary | ICD-10-CM | POA: Diagnosis not present

## 2022-07-06 DIAGNOSIS — M4322 Fusion of spine, cervical region: Secondary | ICD-10-CM | POA: Diagnosis not present

## 2022-07-11 ENCOUNTER — Ambulatory Visit
Admission: RE | Admit: 2022-07-11 | Discharge: 2022-07-11 | Disposition: A | Discharge status: Home / Self Care | Payer: Medicare Other | Source: Ambulatory Visit | Attending: Internal Medicine | Admitting: Internal Medicine

## 2022-07-11 DIAGNOSIS — Z01812 Encounter for preprocedural laboratory examination: Secondary | ICD-10-CM | POA: Diagnosis not present

## 2022-07-11 DIAGNOSIS — M25562 Pain in left knee: Secondary | ICD-10-CM | POA: Diagnosis not present

## 2022-07-11 DIAGNOSIS — Z1231 Encounter for screening mammogram for malignant neoplasm of breast: Secondary | ICD-10-CM | POA: Diagnosis not present

## 2022-07-13 DIAGNOSIS — M4322 Fusion of spine, cervical region: Secondary | ICD-10-CM | POA: Diagnosis not present

## 2022-07-13 DIAGNOSIS — M542 Cervicalgia: Secondary | ICD-10-CM | POA: Diagnosis not present

## 2022-07-13 DIAGNOSIS — M5412 Radiculopathy, cervical region: Secondary | ICD-10-CM | POA: Diagnosis not present

## 2022-07-13 DIAGNOSIS — M5451 Vertebrogenic low back pain: Secondary | ICD-10-CM | POA: Diagnosis not present

## 2022-07-19 DIAGNOSIS — G4730 Sleep apnea, unspecified: Secondary | ICD-10-CM | POA: Diagnosis not present

## 2022-07-20 DIAGNOSIS — M5412 Radiculopathy, cervical region: Secondary | ICD-10-CM | POA: Diagnosis not present

## 2022-07-20 DIAGNOSIS — M5451 Vertebrogenic low back pain: Secondary | ICD-10-CM | POA: Diagnosis not present

## 2022-07-20 DIAGNOSIS — M4322 Fusion of spine, cervical region: Secondary | ICD-10-CM | POA: Diagnosis not present

## 2022-07-20 DIAGNOSIS — M542 Cervicalgia: Secondary | ICD-10-CM | POA: Diagnosis not present

## 2022-07-26 ENCOUNTER — Inpatient Hospital Stay: Payer: Medicare Other | Attending: Internal Medicine

## 2022-07-26 ENCOUNTER — Encounter: Payer: Self-pay | Admitting: Internal Medicine

## 2022-07-26 ENCOUNTER — Telehealth: Payer: Self-pay | Admitting: Internal Medicine

## 2022-07-26 ENCOUNTER — Inpatient Hospital Stay (HOSPITAL_BASED_OUTPATIENT_CLINIC_OR_DEPARTMENT_OTHER): Payer: Medicare Other | Admitting: Internal Medicine

## 2022-07-26 VITALS — BP 116/59 | HR 79 | Temp 97.8°F | Resp 16 | Wt 175.0 lb

## 2022-07-26 DIAGNOSIS — J45909 Unspecified asthma, uncomplicated: Secondary | ICD-10-CM | POA: Diagnosis not present

## 2022-07-26 DIAGNOSIS — D46Z Other myelodysplastic syndromes: Secondary | ICD-10-CM

## 2022-07-26 DIAGNOSIS — R5383 Other fatigue: Secondary | ICD-10-CM | POA: Diagnosis not present

## 2022-07-26 DIAGNOSIS — D469 Myelodysplastic syndrome, unspecified: Secondary | ICD-10-CM | POA: Insufficient documentation

## 2022-07-26 DIAGNOSIS — M2550 Pain in unspecified joint: Secondary | ICD-10-CM | POA: Insufficient documentation

## 2022-07-26 DIAGNOSIS — Z79899 Other long term (current) drug therapy: Secondary | ICD-10-CM | POA: Diagnosis not present

## 2022-07-26 DIAGNOSIS — Z8249 Family history of ischemic heart disease and other diseases of the circulatory system: Secondary | ICD-10-CM | POA: Diagnosis not present

## 2022-07-26 DIAGNOSIS — I10 Essential (primary) hypertension: Secondary | ICD-10-CM | POA: Insufficient documentation

## 2022-07-26 DIAGNOSIS — Z8349 Family history of other endocrine, nutritional and metabolic diseases: Secondary | ICD-10-CM | POA: Insufficient documentation

## 2022-07-26 DIAGNOSIS — Z90710 Acquired absence of both cervix and uterus: Secondary | ICD-10-CM | POA: Insufficient documentation

## 2022-07-26 DIAGNOSIS — D539 Nutritional anemia, unspecified: Secondary | ICD-10-CM | POA: Diagnosis not present

## 2022-07-26 DIAGNOSIS — Z833 Family history of diabetes mellitus: Secondary | ICD-10-CM | POA: Insufficient documentation

## 2022-07-26 LAB — CBC WITH DIFFERENTIAL/PLATELET
Abs Immature Granulocytes: 0.06 10*3/uL (ref 0.00–0.07)
Basophils Absolute: 0.1 10*3/uL (ref 0.0–0.1)
Basophils Relative: 2 %
Eosinophils Absolute: 0.2 10*3/uL (ref 0.0–0.5)
Eosinophils Relative: 4 %
HCT: 34.2 % — ABNORMAL LOW (ref 36.0–46.0)
Hemoglobin: 11.7 g/dL — ABNORMAL LOW (ref 12.0–15.0)
Immature Granulocytes: 1 %
Lymphocytes Relative: 29 %
Lymphs Abs: 1.4 10*3/uL (ref 0.7–4.0)
MCH: 36.6 pg — ABNORMAL HIGH (ref 26.0–34.0)
MCHC: 34.2 g/dL (ref 30.0–36.0)
MCV: 106.9 fL — ABNORMAL HIGH (ref 80.0–100.0)
Monocytes Absolute: 0.4 10*3/uL (ref 0.1–1.0)
Monocytes Relative: 9 %
Neutro Abs: 2.7 10*3/uL (ref 1.7–7.7)
Neutrophils Relative %: 55 %
Platelets: 134 10*3/uL — ABNORMAL LOW (ref 150–400)
RBC: 3.2 MIL/uL — ABNORMAL LOW (ref 3.87–5.11)
RDW: 13.2 % (ref 11.5–15.5)
WBC: 4.9 10*3/uL (ref 4.0–10.5)
nRBC: 0 % (ref 0.0–0.2)

## 2022-07-26 LAB — COMPREHENSIVE METABOLIC PANEL
ALT: 20 U/L (ref 0–44)
AST: 18 U/L (ref 15–41)
Albumin: 4.2 g/dL (ref 3.5–5.0)
Alkaline Phosphatase: 66 U/L (ref 38–126)
Anion gap: 7 (ref 5–15)
BUN: 17 mg/dL (ref 8–23)
CO2: 27 mmol/L (ref 22–32)
Calcium: 9 mg/dL (ref 8.9–10.3)
Chloride: 102 mmol/L (ref 98–111)
Creatinine, Ser: 0.77 mg/dL (ref 0.44–1.00)
GFR, Estimated: >60 mL/min (ref >60)
Glucose, Bld: 102 mg/dL — ABNORMAL HIGH (ref 70–99)
Potassium: 3.8 mmol/L (ref 3.5–5.1)
Sodium: 136 mmol/L (ref 135–145)
Total Bilirubin: 0.6 mg/dL (ref 0.3–1.2)
Total Protein: 7.3 g/dL (ref 6.5–8.1)

## 2022-07-26 LAB — IRON AND TIBC
Iron: 100 ug/dL (ref 28–170)
Saturation Ratios: 32 % — ABNORMAL HIGH (ref 10.4–31.8)
TIBC: 318 ug/dL (ref 250–450)
UIBC: 218 ug/dL

## 2022-07-26 LAB — FERRITIN: Ferritin: 44 ng/mL (ref 11–307)

## 2022-07-26 LAB — LACTATE DEHYDROGENASE: LDH: 120 U/L (ref 98–192)

## 2022-07-26 NOTE — Telephone Encounter (Signed)
Pt called sating her Hemoglobin is up to a 11.7

## 2022-07-26 NOTE — Telephone Encounter (Signed)
FYI

## 2022-07-26 NOTE — Progress Notes (Signed)
Pt in for follow up, reports she is feeling much better than when she first started coming. Denies any concerns.

## 2022-07-26 NOTE — Assessment & Plan Note (Addendum)
#[  FEB 2023] Mild macrocytic anemia/thrombocytopenia-hemoglobin around 11-12; platelets 127-140s.  Normal white count. December 2023-bone marrow biopsy -Hypercellular bone marrow for age with dyspoietic changes; no blasts noted. cytogenetics/FISH positive for 5 q. Deletion.  December 2023 hemoglobin 10.3; platelets 137; white count is normal.  # Patient understands disease incurable; current treatments are usually indefinite.  Reviewed the natural history of low-grade MDS-deletion overall good prognosis.   #  Hemoglobin today is 11.7 the best in the last many months.  Continue oral iron/patient preference.  Patient tolerating well.  And also as patient is not significant symptomatic I think is reasonable to continue surveillance every 3-4 months.  The patient continues to drop/gets more symptomatic recommend Revlimid 10 mg 3 on and 1 week off.  For now continue surveillance.  # DISPOSITION:  # Follow up in  4  MONTHS- - MD;labs- cbc/cmp; LDH;-  Dr.B

## 2022-07-26 NOTE — Progress Notes (Signed)
[facility] NOTE  Patient Care Team: Crecencio Mc, MD as PCP - General (Internal Medicine) Crecencio Mc, MD (Internal Medicine) Bary Castilla, Forest Gleason, MD (General Surgery) Cammie Sickle, MD as Consulting Physician (Hematology and Oncology)  CHIEF COMPLAINTS/PURPOSE OF CONSULTATION: MDS  HEMATOLOGY HISTORY: Oncology History Overview Note  DEC 2023- DIAGNOSIS: LOW GRADE MDS- 5 Q DEL;   # MACROCYTIC ANEMIA [since 2022- April hb 11; MCV-103; platelet- 120s] EGD/ colonoscopy >>> 10 years ago; OCT 2022-slight improvement blood counts/stable; hepatitis HIV negative.  Myeloma panel negative.   BONE MARROW, ASPIRATE, CLOT, CORE: -Hypercellular bone marrow for age with dyspoietic changes -See comment  PERIPHERAL BLOOD: -Macrocytic anemia -Thrombocytopenia  COMMENT:  The overall findings are very concerning for involvement by a low-grade myelodysplastic syndrome.  The differential diagnosis includes changes related to nutritional deficiency, medication, immune mediated process, infection, etc. Correlation with cytogenetic and FISH studies is recommended.  MICROSCOPIC DESCRIPTION:  PERIPHERAL BLOOD SMEAR: The red blood cells display mild anisopoikilocytosis with mild polychromasia.  The white blood cells are normal in number with occasional hypogranular and/or hypolobated neutrophils.  Scattered reactive appearing lymphocytes are present.  The platelets are decreased in number.  BONE MARROW ASPIRATE: Bone marrow particles present. Erythroid precursors: Progressive maturation with occasional late precursors displaying nuclear cytoplasmic dyssynchrony. Granulocytic precursors: Progressive maturation with many mature neutrophils displaying hypogranulation and/or hypolobation.  No increase in blastic cells identified. Megakaryocytes: Abundant with many small and/or hypolobated/unilobated forms Lymphocytes/plasma cells: Large aggregates not  present  TOUCH PREPARATIONS: A mixture of cell types present.  CLOT AND BIOPSY: The sections show variable cellularity ranging from 10% focally to 70% with a mixture of cell types.  Expansile sheets of blastic cells are not identified.  A small interstitial lymphoid aggregate composed of small lymphoid cells is seen.  IRON STAIN: Iron stains are performed on a bone marrow aspirate or touch imprint smear and section of clot. The controls stained appropriately.       Storage Iron:  Abundant      Ring Sideroblasts: Absent  ADDITIONAL DATA/TESTING: The specimen was sent for cytogenetic analysis and FISH for MDS and a separate report will follow. Flow cytometric analysis (WLS [id number]) was performed and shows T cells with nonspecific changes.  No monoclonal B-cell population or significant CD34 positive blastic population identified.     #Incidental right hepatic lobe 13 mm hypoechoic lesion [ultrasound-October 2022]-MRI OCT 2022-   Low clinical concern for metastatic disease; however need to rule out primary [place] versus others.  1. There are 3 small liver lesions, 2 of which represent simple cysts. 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 MAY 2023-abdominal MRI -benign cyst no further surveillance recommended.   #October 2022-ultrasound incidental bilateral mild to moderate hydronephrosis.-Rs/p evaluation with Dr.Brandon urology.   MDS (myelodysplastic syndrome), low grade  04/20/2022 Initial Diagnosis   MDS (myelodysplastic syndrome), low grade (HCC)    HISTORY OF PRESENTING ILLNESS: Ambulating independently.  With friend.   Sarah [name] 72 y.o.  female is here for follow-up today anemia/thrombocytopenia is here for a follow up.   Pt in for follow up, reports she is feeling much better than when she first started coming. Denies any concerns   Denies any blood in stools or black-colored stools.  Patient  denies any nausea vomiting.  Chronic mild fatigue.  Chronic mild easy bruising.  No tremors.  Review of Systems  Constitutional:  Positive for malaise/fatigue.  Negative for chills, diaphoresis, fever and weight loss.  HENT:  Negative for nosebleeds and sore throat.   Eyes:  Negative for double vision.  Respiratory:  Negative for cough, hemoptysis, sputum production, shortness of breath and wheezing.   Cardiovascular:  Negative for chest pain, palpitations, orthopnea and leg swelling.  Gastrointestinal:  Negative for abdominal pain, blood in stool, constipation, diarrhea, heartburn, melena, nausea and vomiting.  Genitourinary:  Negative for dysuria, frequency and urgency.  Musculoskeletal:  Positive for joint pain. Negative for back pain.  Skin: Negative.  Negative for itching and rash.  Neurological:  Negative for dizziness, tingling, focal weakness, weakness and headaches.  Endo/Heme/Allergies:  Bruises/bleeds easily.  Psychiatric/Behavioral:  Negative for depression. The patient is not nervous/anxious and does not have insomnia.     MEDICAL HISTORY:  Past Medical History:  Diagnosis Date   Arthritis    Asthma    Depression    Dyspnea    with heat   Environmental and seasonal allergies    GERD (gastroesophageal reflux disease)    Hypertension    Hypothyroidism    Presence of dental prosthetic device    Dental implants   Wheezing     SURGICAL HISTORY: Past Surgical History:  Procedure Laterality Date   ABDOMINAL HYSTERECTOMY  04/26/1987   ANTERIOR CERVICAL DECOMPRESSION/DISCECTOMY FUSION 4 LEVELS N/A 11/10/2021   Procedure: C3-7 ANTERIOR CERVICAL DISCECTOMY AND FUSION (GLOBUS FORGE);  Surgeon: Meade Maw, MD;  Location: [facility];  Service: Neurosurgery;  Laterality: N/A;   BROW LIFT Bilateral 02/07/2020   Procedure: BLEPHAROPLASTY UPPER EYELID; W/EXCESS SKIN BROW PTOSIS REPAIR BILATERAL;  Surgeon: Karle Starch, MD;  Location: [facility];  Service:  Ophthalmology;  Laterality: Bilateral;   CATARACT EXTRACTION W/PHACO Right 09/13/2016   Procedure: CATARACT EXTRACTION PHACO AND INTRAOCULAR LENS PLACEMENT (IOC);  Surgeon: Birder Robson, MD;  Location: [facility];  Service: Ophthalmology;  Laterality: Right;  [place] 00:38 AP% 14.3 CDE 5.51 Fluid pack lot # [id number]   CATARACT EXTRACTION W/PHACO Left 10/11/2016   Procedure: CATARACT EXTRACTION PHACO AND INTRAOCULAR LENS PLACEMENT (IOC);  Surgeon: Birder Robson, MD;  Location: [facility];  Service: Ophthalmology;  Laterality: Left;  [place] 00:30 AP% 11.3 CDE 3.50 fluid pack lot # [id number]   COLONOSCOPY WITH PROPOFOL N/A 09/30/2014   Procedure: COLONOSCOPY WITH PROPOFOL;  Surgeon: Robert Bellow, MD;  [facility];  Service: Endoscopy;  Laterality: N/A;   COLONOSCOPY WITH PROPOFOL N/A 03/24/2021   Procedure: COLONOSCOPY WITH PROPOFOL;  Surgeon: Lin Landsman, MD;  Location[facility];  Service: Gastroenterology;  Laterality: N/A;   ESOPHAGOGASTRODUODENOSCOPY N/A 09/30/2014   Procedure: ESOPHAGOGASTRODUODENOSCOPY (EGD);  Surgeon: Robert Bellow, MD;  Location: [facility] ENDOSCOPY;  Service: Endoscopy;  Laterality: N/A;   ESOPHAGOGASTRODUODENOSCOPY N/A 03/24/2021   Procedure: ESOPHAGOGASTRODUODENOSCOPY (EGD);  Surgeon: Lin Landsman, MD;  Location: [facility] ENDOSCOPY;  Service: Gastroenterology;  Laterality: N/A;   FOOT SURGERY Bilateral 04/26/1983   KNEE SURGERY  04/25/2014    SOCIAL HISTORY: Social History   Socioeconomic History   Marital status: Single    Spouse name: Not on file   Number of children: Not on file   Years of education: Not on file   Highest education level: Not on file  Occupational History   Not on file  Tobacco Use   Smoking status: Never   Smokeless tobacco: Never  Vaping Use   Vaping Use: Never used  Substance and Sexual Activity   Alcohol use: No   Drug use: No   Sexual  activity: Never  Other Topics Concern   Not on file  Social  History Narrative   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 No smoking; no alcohol. Lives in [place], [place] lives self/kitty.    Social Determinants of Health   Financial Resource Strain: Low Risk  (05/12/2022)   Overall Financial Resource Strain (CARDIA)    Difficulty of Paying Living Expenses: Not very hard  Food Insecurity: No Food Insecurity (05/12/2022)   Hunger Vital Sign    Worried About Running Out of Food in the Last Year: Never true    Ran Out of Food in the Last Year: Never true  Transportation Needs: No Transportation Needs (05/12/2022)   PRAPARE - Hydrologist (Medical): No    Lack of Transportation (Non-Medical): No  Physical Activity: Insufficiently Active (05/12/2022)   Exercise Vital Sign    Days of Exercise per Week: 1 day    Minutes of Exercise per Session: 60 min  Stress: No Stress Concern Present (05/12/2022)   [place]    Feeling of Stress : Not at all  Social Connections: Unknown (05/12/2022)   Social Connection and Isolation Panel [NHANES]    Frequency of Communication with Friends and Family: More than three times a week    Frequency of Social Gatherings with Friends and Family: More than three times a week    Attends Religious Services: More than 4 times per year    Active Member of [employer] or Organizations: Yes    Attends Archivist Meetings: More than 4 times per year    Marital Status: Not on file  Intimate Partner Violence: Not At Risk (05/12/2022)   Humiliation, Afraid, Rape, and Kick questionnaire    Fear of Current or Ex-Partner: No    Emotionally Abused: No    Physically Abused: No    Sexually Abused: No    FAMILY HISTORY: Family History  Problem Relation Age of Onset   Diabetes Mother    Hyperlipidemia Mother    Heart disease Mother    Prostate cancer Neg Hx    Kidney cancer Neg Hx    Bladder Cancer Neg Hx     ALLERGIES:  has No Known  Allergies.  MEDICATIONS:  Current Outpatient Medications  Medication Sig Dispense Refill   amLODipine (NORVASC) 5 MG tablet TAKE 1 TABLET BY MOUTH DAILY 100 tablet 2   Calcium Citrate-Vitamin D (CALCIUM CITRATE + D PO) Take 1 tablet by mouth 2 (two) times daily.     citalopram (CELEXA) 20 MG tablet TAKE 1 TABLET BY MOUTH DAILY 90 tablet 3   famotidine (PEPCID) 20 MG tablet TAKE 1 TABLET BY MOUTH DAILY. BEFORE DINNER 30 tablet 11   ferrous sulfate 324 MG TBEC Take 324 mg by mouth. 65 mg of elemental iron     fexofenadine (ALLEGRA) 180 MG tablet Take 180 mg by mouth daily.     Fluticasone-Umeclidin-Vilant (TRELEGY ELLIPTA) 100-62.5-25 MCG/ACT AEPB Inhale 1 puff into the lungs daily. 1 each 11   gabapentin (NEURONTIN) 100 MG capsule Take 1 capsule (100 mg total) by mouth 3 (three) times daily. 300 capsule 2   levothyroxine (SYNTHROID) 50 MCG tablet TAKE 1 TABLET BY MOUTH DAILY ON  EMPTY STOMACH WITH A GLASS OF  WATER AT LEAST 30 TO 60 MIN  BEFORE BREAKFAST 100 tablet 2   pantoprazole (PROTONIX) 40 MG tablet TAKE 1 TABLET BY MOUTH DAILY 100 tablet 2   rosuvastatin (CRESTOR) 10  MG tablet Take 1 tablet (10 mg total) by mouth daily. 100 tablet 2   solifenacin (VESICARE) 5 MG tablet TAKE ONE TABLET BY MOUTH EVERY DAY 30 tablet 2   vitamin B-12 (CYANOCOBALAMIN) 1000 MCG tablet Take 1,000 mcg by mouth daily.     albuterol (VENTOLIN HFA) 108 (90 Base) MCG/ACT inhaler Inhale 2 puffs into the lungs every 6 (six) hours as needed for wheezing or shortness of breath. (Patient not taking: Reported on 07/26/2022) 3 each 3   budesonide (PULMICORT) 0.25 MG/2ML nebulizer solution Take 2 mLs (0.25 mg total) by nebulization 2 (two) times daily. (Patient not taking: Reported on 07/26/2022) 60 mL 12   ipratropium-albuterol (DUONEB) 0.5-2.5 (3) MG/3ML SOLN Take 3 mLs by nebulization every 6 (six) hours as needed. (Patient not taking: Reported on 07/26/2022) 360 mL 2   traZODone (DESYREL) 50 MG tablet TAKE 1/2 TO 1 TABLET BY  MOUTH AT BEDTIME AS NEEDED FOR SLEEP (Patient not taking: Reported on 07/26/2022) 90 tablet 3   No current facility-administered medications for this visit.      PHYSICAL EXAMINATION:   Vitals:   07/26/22 1507  BP: (!) 116/59  Pulse: 79  Resp: 16  Temp: 97.8 F (36.6 C)  SpO2: 93%     Filed Weights   07/26/22 1507  Weight: 175 lb (79.4 kg)      Physical Exam Vitals and nursing note reviewed.  HENT:     Head: Normocephalic and atraumatic.     Mouth/Throat:     Pharynx: Oropharynx is clear.  Eyes:     Extraocular Movements: Extraocular movements intact.     Pupils: Pupils are equal, round, and reactive to light.  Cardiovascular:     Rate and Rhythm: Normal rate and regular rhythm.  Pulmonary:     Comments: Decreased breath sounds bilaterally.  Abdominal:     Palpations: Abdomen is soft.  Musculoskeletal:        General: Normal range of motion.     Cervical back: Normal range of motion.  Skin:    General: Skin is warm.  Neurological:     General: No focal deficit present.     Mental Status: She is alert and oriented to person, place, and time.  Psychiatric:        Behavior: Behavior normal.        Judgment: Judgment normal.     LABORATORY DATA:  I have reviewed the data as listed Lab Results  Component Value Date   WBC 4.9 07/26/2022   HGB 11.7 (L) 07/26/2022   HCT 34.2 (L) 07/26/2022   MCV 106.9 (H) 07/26/2022   PLT 134 (L) 07/26/2022   Recent Labs    08/27/21 1032 09/15/21 0758 03/23/22 1434 07/26/22 1432  NA 136 140 141 136  K 3.8 3.9 3.8 3.8  CL 104 103 106 102  CO2 26 29 29 27   GLUCOSE 113* 105* 106* 102*  BUN 17 18 18 17   CREATININE 0.80 0.92 0.78 0.77  CALCIUM 9.0 9.6 8.8* 9.0  GFRNONAA >60  --  >60 >60  PROT 7.1 6.8 7.1 7.3  ALBUMIN 4.1 4.2 4.0 4.2  AST 15 36 11* 18  ALT 13 28 11 20   ALKPHOS 71 73 58 66  BILITOT 0.5 0.5 0.3 0.6     Mammogram 3D SCREEN BREAST BILATERAL  Result Date: 07/13/2022 CLINICAL DATA:  Screening.  EXAM: DIGITAL SCREENING BILATERAL MAMMOGRAM WITH TOMOSYNTHESIS AND CAD TECHNIQUE: Bilateral screening digital craniocaudal and mediolateral oblique mammograms were obtained. Bilateral  screening digital breast tomosynthesis was performed. The images were evaluated with computer-aided detection. COMPARISON:  Previous exam(s). ACR Breast Density Category c: The breasts are heterogeneously dense, which may obscure small masses. FINDINGS: There are no findings suspicious for malignancy. IMPRESSION: No mammographic evidence of malignancy. A result letter of this screening mammogram will be mailed directly to the patient. RECOMMENDATION: Screening mammogram in one year. (Code:SM-B-01Y) BI-RADS CATEGORY  1: Negative. Electronically Signed   By: Lajean Manes M.D.   On: 07/13/2022 08:27    MDS (myelodysplastic syndrome), low grade (HCC) #[FEB 2023] Mild macrocytic anemia/thrombocytopenia-hemoglobin around 11-12; platelets 127-140s.  Normal white count. December 2023-bone marrow biopsy -Hypercellular bone marrow for age with dyspoietic changes; no blasts noted. cytogenetics/FISH positive for 5 q. Deletion.  December 2023 hemoglobin 10.3; platelets 137; white count is normal.  # Patient understands disease incurable; current treatments are usually indefinite.  Reviewed the natural history of low-grade MDS-deletion overall good prognosis.   #  Hemoglobin today is 11.7 the best in the last many months.  Continue oral iron/patient preference.  Patient tolerating well.  And also as patient is not significant symptomatic I think is reasonable to continue surveillance every 3-4 months.  The patient continues to drop/gets more symptomatic recommend Revlimid 10 mg 3 on and 1 week off.  For now continue surveillance.  # DISPOSITION:  # Follow up in  4  MONTHS- - MD;labs- cbc/cmp; LDH;-  Dr.B  All questions were answered. The patient knows to call the clinic with any problems, questions or concerns.    Cammie Sickle, MD 07/26/2022 3:45 PM

## 2022-07-27 LAB — ERYTHROPOIETIN: Erythropoietin: 37.3 m[IU]/mL — ABNORMAL HIGH (ref 2.6–18.5)

## 2022-07-28 DIAGNOSIS — H02889 Meibomian gland dysfunction of unspecified eye, unspecified eyelid: Secondary | ICD-10-CM | POA: Diagnosis not present

## 2022-07-28 DIAGNOSIS — H43813 Vitreous degeneration, bilateral: Secondary | ICD-10-CM | POA: Diagnosis not present

## 2022-07-28 DIAGNOSIS — Z961 Presence of intraocular lens: Secondary | ICD-10-CM | POA: Diagnosis not present

## 2022-08-02 ENCOUNTER — Ambulatory Visit (INDEPENDENT_AMBULATORY_CARE_PROVIDER_SITE_OTHER): Payer: Medicare Other | Admitting: Adult Health

## 2022-08-02 DIAGNOSIS — R0683 Snoring: Secondary | ICD-10-CM

## 2022-08-02 DIAGNOSIS — G4733 Obstructive sleep apnea (adult) (pediatric): Secondary | ICD-10-CM

## 2022-08-02 DIAGNOSIS — R400 Somnolence: Secondary | ICD-10-CM

## 2022-08-03 DIAGNOSIS — M5451 Vertebrogenic low back pain: Secondary | ICD-10-CM | POA: Diagnosis not present

## 2022-08-03 DIAGNOSIS — M5412 Radiculopathy, cervical region: Secondary | ICD-10-CM | POA: Diagnosis not present

## 2022-08-03 DIAGNOSIS — M4322 Fusion of spine, cervical region: Secondary | ICD-10-CM | POA: Diagnosis not present

## 2022-08-03 DIAGNOSIS — M542 Cervicalgia: Secondary | ICD-10-CM | POA: Diagnosis not present

## 2022-08-04 ENCOUNTER — Ambulatory Visit: Payer: Self-pay | Admitting: Neurosurgery

## 2022-08-11 ENCOUNTER — Other Ambulatory Visit: Payer: Self-pay

## 2022-08-11 ENCOUNTER — Encounter: Payer: Self-pay | Admitting: Intensive Care

## 2022-08-11 ENCOUNTER — Emergency Department: Payer: Medicare Other

## 2022-08-11 ENCOUNTER — Inpatient Hospital Stay
Admission: EM | Admit: 2022-08-11 | Discharge: 2022-08-13 | DRG: 194 | Disposition: A | Discharge status: Home / Self Care | Payer: Medicare Other | Source: Ambulatory Visit | Attending: Osteopathic Medicine | Admitting: Osteopathic Medicine

## 2022-08-11 DIAGNOSIS — Z833 Family history of diabetes mellitus: Secondary | ICD-10-CM | POA: Diagnosis not present

## 2022-08-11 DIAGNOSIS — Z981 Arthrodesis status: Secondary | ICD-10-CM

## 2022-08-11 DIAGNOSIS — D469 Myelodysplastic syndrome, unspecified: Secondary | ICD-10-CM | POA: Diagnosis not present

## 2022-08-11 DIAGNOSIS — J4521 Mild intermittent asthma with (acute) exacerbation: Secondary | ICD-10-CM | POA: Diagnosis not present

## 2022-08-11 DIAGNOSIS — R7303 Prediabetes: Secondary | ICD-10-CM | POA: Diagnosis present

## 2022-08-11 DIAGNOSIS — Z83438 Family history of other disorder of lipoprotein metabolism and other lipidemia: Secondary | ICD-10-CM | POA: Diagnosis not present

## 2022-08-11 DIAGNOSIS — Z79899 Other long term (current) drug therapy: Secondary | ICD-10-CM | POA: Diagnosis not present

## 2022-08-11 DIAGNOSIS — I10 Essential (primary) hypertension: Secondary | ICD-10-CM | POA: Diagnosis present

## 2022-08-11 DIAGNOSIS — Z6830 Body mass index (BMI) 30.0-30.9, adult: Secondary | ICD-10-CM | POA: Diagnosis not present

## 2022-08-11 DIAGNOSIS — D696 Thrombocytopenia, unspecified: Secondary | ICD-10-CM | POA: Diagnosis not present

## 2022-08-11 DIAGNOSIS — K219 Gastro-esophageal reflux disease without esophagitis: Secondary | ICD-10-CM | POA: Diagnosis not present

## 2022-08-11 DIAGNOSIS — F32A Depression, unspecified: Secondary | ICD-10-CM | POA: Diagnosis not present

## 2022-08-11 DIAGNOSIS — Z90710 Acquired absence of both cervix and uterus: Secondary | ICD-10-CM

## 2022-08-11 DIAGNOSIS — J189 Pneumonia, unspecified organism: Secondary | ICD-10-CM | POA: Diagnosis present

## 2022-08-11 DIAGNOSIS — E039 Hypothyroidism, unspecified: Secondary | ICD-10-CM | POA: Diagnosis present

## 2022-08-11 DIAGNOSIS — E669 Obesity, unspecified: Secondary | ICD-10-CM | POA: Diagnosis present

## 2022-08-11 DIAGNOSIS — E785 Hyperlipidemia, unspecified: Secondary | ICD-10-CM | POA: Diagnosis present

## 2022-08-11 DIAGNOSIS — F329 Major depressive disorder, single episode, unspecified: Secondary | ICD-10-CM | POA: Diagnosis not present

## 2022-08-11 DIAGNOSIS — R0902 Hypoxemia: Secondary | ICD-10-CM | POA: Diagnosis not present

## 2022-08-11 DIAGNOSIS — D46Z Other myelodysplastic syndromes: Secondary | ICD-10-CM | POA: Diagnosis present

## 2022-08-11 DIAGNOSIS — J4541 Moderate persistent asthma with (acute) exacerbation: Secondary | ICD-10-CM

## 2022-08-11 DIAGNOSIS — R0602 Shortness of breath: Secondary | ICD-10-CM | POA: Diagnosis not present

## 2022-08-11 DIAGNOSIS — Z03818 Encounter for observation for suspected exposure to other biological agents ruled out: Secondary | ICD-10-CM | POA: Diagnosis not present

## 2022-08-11 DIAGNOSIS — Z8249 Family history of ischemic heart disease and other diseases of the circulatory system: Secondary | ICD-10-CM

## 2022-08-11 DIAGNOSIS — J45901 Unspecified asthma with (acute) exacerbation: Secondary | ICD-10-CM | POA: Diagnosis present

## 2022-08-11 DIAGNOSIS — R062 Wheezing: Secondary | ICD-10-CM | POA: Diagnosis not present

## 2022-08-11 HISTORY — DX: Pneumonia, unspecified organism: J18.9

## 2022-08-11 LAB — CBC WITH DIFFERENTIAL/PLATELET
Abs Immature Granulocytes: 0.3 10*3/uL — ABNORMAL HIGH (ref 0.00–0.07)
Basophils Absolute: 0.1 10*3/uL (ref 0.0–0.1)
Basophils Relative: 2 %
Eosinophils Absolute: 0 10*3/uL (ref 0.0–0.5)
Eosinophils Relative: 1 %
HCT: 33.5 % — ABNORMAL LOW (ref 36.0–46.0)
Hemoglobin: 11.2 g/dL — ABNORMAL LOW (ref 12.0–15.0)
Immature Granulocytes: 6 %
Lymphocytes Relative: 15 %
Lymphs Abs: 0.8 10*3/uL (ref 0.7–4.0)
MCH: 35.9 pg — ABNORMAL HIGH (ref 26.0–34.0)
MCHC: 33.4 g/dL (ref 30.0–36.0)
MCV: 107.4 fL — ABNORMAL HIGH (ref 80.0–100.0)
Monocytes Absolute: 0.3 10*3/uL (ref 0.1–1.0)
Monocytes Relative: 6 %
Neutro Abs: 3.9 10*3/uL (ref 1.7–7.7)
Neutrophils Relative %: 70 %
Platelets: 115 10*3/uL — ABNORMAL LOW (ref 150–400)
RBC: 3.12 MIL/uL — ABNORMAL LOW (ref 3.87–5.11)
RDW: 13.5 % (ref 11.5–15.5)
Smear Review: NORMAL
WBC Morphology: INCREASED
WBC: 5.5 10*3/uL (ref 4.0–10.5)
nRBC: 0 % (ref 0.0–0.2)

## 2022-08-11 LAB — COMPREHENSIVE METABOLIC PANEL
ALT: 83 U/L — ABNORMAL HIGH (ref 0–44)
AST: 80 U/L — ABNORMAL HIGH (ref 15–41)
Albumin: 4 g/dL (ref 3.5–5.0)
Alkaline Phosphatase: 98 U/L (ref 38–126)
Anion gap: 9 (ref 5–15)
BUN: 15 mg/dL (ref 8–23)
CO2: 24 mmol/L (ref 22–32)
Calcium: 9 mg/dL (ref 8.9–10.3)
Chloride: 104 mmol/L (ref 98–111)
Creatinine, Ser: 0.84 mg/dL (ref 0.44–1.00)
GFR, Estimated: >60 mL/min (ref >60)
Glucose, Bld: 107 mg/dL — ABNORMAL HIGH (ref 70–99)
Potassium: 3.7 mmol/L (ref 3.5–5.1)
Sodium: 137 mmol/L (ref 135–145)
Total Bilirubin: 0.6 mg/dL (ref 0.3–1.2)
Total Protein: 7.1 g/dL (ref 6.5–8.1)

## 2022-08-11 LAB — EXPECTORATED SPUTUM ASSESSMENT W GRAM STAIN, RFLX TO RESP C

## 2022-08-11 MED ORDER — ENOXAPARIN SODIUM 40 MG/0.4ML IJ SOSY
40.0000 mg | PREFILLED_SYRINGE | INTRAMUSCULAR | Status: DC
  Administered 2022-08-11: 40 mg via SUBCUTANEOUS
  Filled 2022-08-11: qty 0.4

## 2022-08-11 MED ORDER — PANTOPRAZOLE SODIUM 40 MG PO TBEC
40.0000 mg | DELAYED_RELEASE_TABLET | Freq: Every day | ORAL | Status: DC
  Administered 2022-08-12 – 2022-08-13 (×2): 40 mg via ORAL
  Filled 2022-08-11 (×2): qty 1

## 2022-08-11 MED ORDER — IPRATROPIUM-ALBUTEROL 0.5-2.5 (3) MG/3ML IN SOLN
3.0000 mL | Freq: Once | RESPIRATORY_TRACT | Status: AC
  Administered 2022-08-11: 3 mL via RESPIRATORY_TRACT
  Filled 2022-08-11: qty 3

## 2022-08-11 MED ORDER — PREDNISONE 20 MG PO TABS
60.0000 mg | ORAL_TABLET | Freq: Once | ORAL | Status: AC
  Administered 2022-08-11: 60 mg via ORAL
  Filled 2022-08-11: qty 3

## 2022-08-11 MED ORDER — PREDNISONE 50 MG PO TABS: ORAL_TABLET | ORAL | 0 refills | Status: DC

## 2022-08-11 MED ORDER — LORATADINE 10 MG PO TABS
10.0000 mg | ORAL_TABLET | Freq: Every day | ORAL | Status: DC
  Administered 2022-08-11 – 2022-08-13 (×3): 10 mg via ORAL
  Filled 2022-08-11 (×3): qty 1

## 2022-08-11 MED ORDER — DOXYCYCLINE HYCLATE 50 MG PO CAPS: 100.0000 mg | ORAL_CAPSULE | Freq: Two times a day (BID) | ORAL | 0 refills | Status: DC

## 2022-08-11 MED ORDER — DM-GUAIFENESIN ER 30-600 MG PO TB12: 1.0000 | ORAL_TABLET | Freq: Two times a day (BID) | ORAL | Status: DC | PRN

## 2022-08-11 MED ORDER — SODIUM CHLORIDE 0.9 % IV SOLN
1.0000 g | INTRAVENOUS | Status: DC
  Administered 2022-08-11: 1 g via INTRAVENOUS
  Filled 2022-08-11 (×2): qty 10

## 2022-08-11 MED ORDER — CALCIUM CARBONATE 1250 (500 CA) MG PO TABS
1.0000 | ORAL_TABLET | Freq: Two times a day (BID) | ORAL | Status: DC
  Administered 2022-08-11 – 2022-08-13 (×4): 1250 mg via ORAL
  Filled 2022-08-11 (×4): qty 1

## 2022-08-11 MED ORDER — AMLODIPINE BESYLATE 5 MG PO TABS
5.0000 mg | ORAL_TABLET | Freq: Every day | ORAL | Status: DC
  Administered 2022-08-12 – 2022-08-13 (×2): 5 mg via ORAL
  Filled 2022-08-11 (×2): qty 1

## 2022-08-11 MED ORDER — ALBUTEROL SULFATE (2.5 MG/3ML) 0.083% IN NEBU
2.5000 mg | INHALATION_SOLUTION | RESPIRATORY_TRACT | Status: DC | PRN
  Administered 2022-08-11 – 2022-08-13 (×2): 2.5 mg via RESPIRATORY_TRACT
  Filled 2022-08-11 (×2): qty 3

## 2022-08-11 MED ORDER — ONDANSETRON HCL 4 MG/2ML IJ SOLN
4.0000 mg | Freq: Once | INTRAMUSCULAR | Status: AC
  Administered 2022-08-11: 4 mg via INTRAVENOUS
  Filled 2022-08-11: qty 2

## 2022-08-11 MED ORDER — ONDANSETRON HCL 4 MG/2ML IJ SOLN: 4.0000 mg | Freq: Three times a day (TID) | INTRAMUSCULAR | Status: DC | PRN

## 2022-08-11 MED ORDER — IPRATROPIUM-ALBUTEROL 0.5-2.5 (3) MG/3ML IN SOLN
3.0000 mL | RESPIRATORY_TRACT | Status: DC
  Administered 2022-08-11: 3 mL via RESPIRATORY_TRACT
  Filled 2022-08-11: qty 3

## 2022-08-11 MED ORDER — AMOXICILLIN-POT CLAVULANATE 875-125 MG PO TABS
1.0000 | ORAL_TABLET | Freq: Once | ORAL | Status: AC
  Administered 2022-08-11: 1 via ORAL
  Filled 2022-08-11: qty 1

## 2022-08-11 MED ORDER — CITALOPRAM HYDROBROMIDE 20 MG PO TABS
20.0000 mg | ORAL_TABLET | Freq: Every day | ORAL | Status: DC
  Administered 2022-08-12 – 2022-08-13 (×2): 20 mg via ORAL
  Filled 2022-08-11 (×2): qty 1

## 2022-08-11 MED ORDER — AMOXICILLIN-POT CLAVULANATE 875-125 MG PO TABS: 1.0000 | ORAL_TABLET | Freq: Two times a day (BID) | ORAL | 0 refills | Status: DC

## 2022-08-11 MED ORDER — VITAMIN B-12 1000 MCG PO TABS
1000.0000 ug | ORAL_TABLET | Freq: Every day | ORAL | Status: DC
  Administered 2022-08-11 – 2022-08-13 (×3): 1000 ug via ORAL
  Filled 2022-08-11 (×3): qty 1

## 2022-08-11 MED ORDER — FAMOTIDINE 20 MG PO TABS
20.0000 mg | ORAL_TABLET | Freq: Every day | ORAL | Status: DC
  Administered 2022-08-12 – 2022-08-13 (×2): 20 mg via ORAL
  Filled 2022-08-11 (×2): qty 1

## 2022-08-11 MED ORDER — ALBUTEROL SULFATE HFA 108 (90 BASE) MCG/ACT IN AERS: 2.0000 | INHALATION_SPRAY | Freq: Four times a day (QID) | RESPIRATORY_TRACT | 2 refills | Status: DC | PRN

## 2022-08-11 MED ORDER — SODIUM CHLORIDE 0.9 % IV SOLN
100.0000 mg | Freq: Two times a day (BID) | INTRAVENOUS | Status: DC
  Administered 2022-08-12 (×2): 100 mg via INTRAVENOUS
  Filled 2022-08-11 (×3): qty 100

## 2022-08-11 MED ORDER — IBUPROFEN 400 MG PO TABS: 200.0000 mg | ORAL_TABLET | Freq: Four times a day (QID) | ORAL | Status: DC | PRN

## 2022-08-11 MED ORDER — DOXYCYCLINE HYCLATE 100 MG PO TABS
100.0000 mg | ORAL_TABLET | Freq: Once | ORAL | Status: AC
  Administered 2022-08-11: 100 mg via ORAL
  Filled 2022-08-11: qty 1

## 2022-08-11 MED ORDER — FERROUS SULFATE 325 (65 FE) MG PO TABS
324.0000 mg | ORAL_TABLET | Freq: Every day | ORAL | Status: DC
  Administered 2022-08-12 – 2022-08-13 (×2): 324 mg via ORAL
  Filled 2022-08-11 (×2): qty 1

## 2022-08-11 MED ORDER — GABAPENTIN 300 MG PO CAPS
300.0000 mg | ORAL_CAPSULE | Freq: Every day | ORAL | Status: DC
  Administered 2022-08-11 – 2022-08-12 (×2): 300 mg via ORAL
  Filled 2022-08-11 (×2): qty 1

## 2022-08-11 MED ORDER — ORAL CARE MOUTH RINSE: 15.0000 mL | OROMUCOSAL | Status: DC | PRN

## 2022-08-11 MED ORDER — METHYLPREDNISOLONE SODIUM SUCC 125 MG IJ SOLR
80.0000 mg | INTRAMUSCULAR | Status: DC
  Administered 2022-08-11: 80 mg via INTRAVENOUS
  Filled 2022-08-11: qty 2

## 2022-08-11 MED ORDER — HYDRALAZINE HCL 20 MG/ML IJ SOLN: 5.0000 mg | INTRAMUSCULAR | Status: DC | PRN

## 2022-08-11 MED ORDER — FESOTERODINE FUMARATE ER 4 MG PO TB24
4.0000 mg | ORAL_TABLET | Freq: Every day | ORAL | Status: DC
  Administered 2022-08-12 – 2022-08-13 (×2): 4 mg via ORAL
  Filled 2022-08-11 (×2): qty 1

## 2022-08-11 MED ORDER — TRAZODONE HCL 50 MG PO TABS: 25.0000 mg | ORAL_TABLET | Freq: Every evening | ORAL | Status: DC | PRN

## 2022-08-11 MED ORDER — LEVOTHYROXINE SODIUM 50 MCG PO TABS
50.0000 ug | ORAL_TABLET | Freq: Every day | ORAL | Status: DC
  Administered 2022-08-12 – 2022-08-13 (×2): 50 ug via ORAL
  Filled 2022-08-11 (×2): qty 1

## 2022-08-11 MED ORDER — ROSUVASTATIN CALCIUM 10 MG PO TABS
10.0000 mg | ORAL_TABLET | Freq: Every day | ORAL | Status: DC
  Administered 2022-08-12 – 2022-08-13 (×2): 10 mg via ORAL
  Filled 2022-08-11 (×2): qty 1

## 2022-08-11 MED ORDER — IPRATROPIUM-ALBUTEROL 0.5-2.5 (3) MG/3ML IN SOLN
3.0000 mL | Freq: Four times a day (QID) | RESPIRATORY_TRACT | Status: DC
  Administered 2022-08-12 (×4): 3 mL via RESPIRATORY_TRACT
  Filled 2022-08-11 (×5): qty 3

## 2022-08-11 NOTE — Progress Notes (Signed)
Patient was in the ER at the time of my call.  I will try her another time.

## 2022-08-11 NOTE — H&P (Signed)
History and Physical    Sarah [name] WGN:[id number] DOB: December 07, 1950 DOA: 08/11/2022  Referring MD/NP/PA:   PCP: Sherlene Shams, MD   Patient coming from:  The patient is coming from home.     Chief Complaint: SOB  HPI: Sarah [name] is a 72 y.o. female with medical history significant of asthma, hypertension, hyperlipidemia, prediabetes, hypothyroidism, depression, GERD, obesity, anemia, thrombocytopenia, MDS low-grade, who presents with shortness of breath.  Patient states that she has shortness of breath for more than 4 days, which is progressively worsening in the past 2 days.  Patient has dry cough, wheezing, no chest pain.  She had mild subjective fever yesterday, but no fever today.  Her temperature is 98.4 in ED.  Patient denies nausea, vomiting, diarrhea or abdominal pain.  No symptoms of UTI.  She denies any choking episode when eating or drinking.  Patient has generalized weakness.  Patient is normally not using oxygen.  Patient was found to have oxygen desaturation to 87% on room air, which improved to 90-100% on 2 L oxygen currently.  No acute respiratory distress.  Data reviewed independently and ED Course: pt was found to have WBC 5.5, negative PCR for COVID, flu and RSV, abnormal liver function (ALP 98, AST 80, ALT 83, total bilirubin 0.6), GFR> 60, temperature normal, blood pressure 114/61, heart rate 97, RR 26.  Chest x-ray showed new patchy infiltration in right middle and right lower lobe.  Patient is admitted to telemetry bed as inpatient.   EKG: I have personally reviewed.  Sinus rhythm, QTc 438, RAD, early R wave progression, low voltage   Review of Systems:   General: Head fevers, no chills, no body weight gain,  has fatigue HEENT: no blurry vision, hearing changes or sore throat Respiratory: has dyspnea, coughing, wheezing CV: no chest pain, no palpitations GI: no nausea, vomiting, abdominal pain, diarrhea, constipation GU: no dysuria, burning on  urination, increased urinary frequency, hematuria  Ext: no leg edema Neuro: no unilateral weakness, numbness, or tingling, no vision change or hearing loss Skin: no rash, no skin tear. MSK: No muscle spasm, no deformity, no limitation of range of movement in spin Heme: No easy bruising.  Travel history: No recent long distant travel.   Allergy: No Known Allergies  Past Medical History:  Diagnosis Date   Arthritis    Asthma    Depression    Dyspnea    with heat   Environmental and seasonal allergies    GERD (gastroesophageal reflux disease)    Hypertension    Hypothyroidism    Presence of dental prosthetic device    Dental implants   Wheezing     Past Surgical History:  Procedure Laterality Date   ABDOMINAL HYSTERECTOMY  04/26/1987   ANTERIOR CERVICAL DECOMPRESSION/DISCECTOMY FUSION 4 LEVELS N/A 11/10/2021   Procedure: C3-7 ANTERIOR CERVICAL DISCECTOMY AND FUSION (GLOBUS FORGE);  Surgeon: Venetia Night, MD;  Location: [facility];  Service: Neurosurgery;  Laterality: N/A;   BROW LIFT Bilateral 02/07/2020   Procedure: BLEPHAROPLASTY UPPER EYELID; W/EXCESS SKIN BROW PTOSIS REPAIR BILATERAL;  Surgeon: Imagene Riches, MD;  Location: [facility];  Service: Ophthalmology;  Laterality: Bilateral;   CATARACT EXTRACTION W/PHACO Right 09/13/2016   Procedure: CATARACT EXTRACTION PHACO AND INTRAOCULAR LENS PLACEMENT (IOC);  Surgeon: Galen Manila, MD;  Location: [facility];  Service: Ophthalmology;  Laterality: Right;  [place] 00:38 AP% 14.3 CDE 5.51 Fluid pack lot # [id number]   CATARACT EXTRACTION W/PHACO Left 10/11/2016   Procedure: CATARACT EXTRACTION PHACO  AND INTRAOCULAR LENS PLACEMENT (IOC);  Surgeon: Galen Manila, MD;  Location: [facility];  Service: Ophthalmology;  Laterality: Left;  [place] 00:30 AP% 11.3 CDE 3.50 fluid pack lot # [id number]   COLONOSCOPY WITH PROPOFOL N/A 09/30/2014   Procedure: COLONOSCOPY WITH PROPOFOL;  Surgeon: Earline Mayotte, MD;  Location: [facility]  ENDOSCOPY;  Service: Endoscopy;  Laterality: N/A;   COLONOSCOPY WITH PROPOFOL N/A 03/24/2021   Procedure: COLONOSCOPY WITH PROPOFOL;  Surgeon: Toney Reil, MD;  Location: [facility] ENDOSCOPY;  Service: Gastroenterology;  Laterality: N/A;   ESOPHAGOGASTRODUODENOSCOPY N/A 09/30/2014   Procedure: ESOPHAGOGASTRODUODENOSCOPY (EGD);  Surgeon: Earline Mayotte, MD;  Location: [facility] ENDOSCOPY;  Service: Endoscopy;  Laterality: N/A;   ESOPHAGOGASTRODUODENOSCOPY N/A 03/24/2021   Procedure: ESOPHAGOGASTRODUODENOSCOPY (EGD);  Surgeon: Toney Reil, MD;  Location: [facility] ENDOSCOPY;  Service: Gastroenterology;  Laterality: N/A;   FOOT SURGERY Bilateral 04/26/1983   KNEE SURGERY  04/25/2014    Social History:  reports that she has never smoked. She has never used smokeless tobacco. She reports that she does not drink alcohol and does not use drugs.  Family History:  Family History  Problem Relation Age of Onset   Diabetes Mother    Hyperlipidemia Mother    Heart disease Mother    Prostate cancer Neg Hx    Kidney cancer Neg Hx    Bladder Cancer Neg Hx      Prior to Admission medications   Medication Sig Start Date End Date Taking? Authorizing Provider  albuterol (VENTOLIN HFA) 108 (90 Base) MCG/ACT inhaler Inhale 2 puffs into the lungs every 6 (six) hours as needed for wheezing or shortness of breath. 08/11/22  Yes Pilar Jarvis, MD  amoxicillin-clavulanate (AUGMENTIN) 875-125 MG tablet Take 1 tablet by mouth 2 (two) times daily for 7 days. 08/11/22 08/18/22 Yes Pilar Jarvis, MD  doxycycline (VIBRAMYCIN) 50 MG capsule Take 2 capsules (100 mg total) by mouth 2 (two) times daily for 7 days. 08/11/22 08/18/22 Yes Pilar Jarvis, MD  predniSONE (DELTASONE) 50 MG tablet Take 1 tablet daily for the next 3 days starting on 08/12/2022 08/11/22  Yes Pilar Jarvis, MD  albuterol (VENTOLIN HFA) 108 (90 Base) MCG/ACT inhaler Inhale 2 puffs into the lungs every 6 (six) hours as needed for wheezing or shortness of  breath. Patient not taking: Reported on 07/26/2022 05/19/21   Sherlene Shams, MD  amLODipine (NORVASC) 5 MG tablet TAKE 1 TABLET BY MOUTH DAILY 03/14/22   Sherlene Shams, MD  budesonide (PULMICORT) 0.25 MG/2ML nebulizer solution Take 2 mLs (0.25 mg total) by nebulization 2 (two) times daily. Patient not taking: Reported on 07/26/2022 11/26/20   McLean-Scocuzza, Pasty Spillers, MD  Calcium Citrate-Vitamin D (CALCIUM CITRATE + D PO) Take 1 tablet by mouth 2 (two) times daily.    [provider]  citalopram (CELEXA) 20 MG tablet TAKE 1 TABLET BY MOUTH DAILY 03/04/22   Sherlene Shams, MD  famotidine (PEPCID) 20 MG tablet TAKE 1 TABLET BY MOUTH DAILY. BEFORE DINNER 07/28/17   Sherlene Shams, MD  ferrous sulfate 324 MG TBEC Take 324 mg by mouth. 65 mg of elemental iron    [provider]  fexofenadine (ALLEGRA) 180 MG tablet Take 180 mg by mouth daily.    [provider]  Fluticasone-Umeclidin-Vilant (TRELEGY ELLIPTA) 100-62.5-25 MCG/ACT AEPB Inhale 1 puff into the lungs daily. 06/23/22   Sherlene Shams, MD  gabapentin (NEURONTIN) 100 MG capsule Take 1 capsule (100 mg total) by mouth 3 (three) times daily. 05/05/22  Sherlene Shams, MD  ipratropium-albuterol (DUONEB) 0.5-2.5 (3) MG/3ML SOLN Take 3 mLs by nebulization every 6 (six) hours as needed. Patient not taking: Reported on 07/26/2022 11/26/20   McLean-Scocuzza, Pasty Spillers, MD  levothyroxine (SYNTHROID) 50 MCG tablet TAKE 1 TABLET BY MOUTH DAILY ON  EMPTY STOMACH WITH A GLASS OF  WATER AT LEAST 30 TO 60 MIN  BEFORE BREAKFAST 03/14/22   Sherlene Shams, MD  pantoprazole (PROTONIX) 40 MG tablet TAKE 1 TABLET BY MOUTH DAILY 03/14/22   Sherlene Shams, MD  rosuvastatin (CRESTOR) 10 MG tablet Take 1 tablet (10 mg total) by mouth daily. 05/05/22   Sherlene Shams, MD  solifenacin (VESICARE) 5 MG tablet TAKE ONE TABLET BY MOUTH EVERY DAY 05/05/22   Sherlene Shams, MD  traZODone (DESYREL) 50 MG tablet TAKE 1/2 TO 1 TABLET BY MOUTH AT BEDTIME AS  NEEDED FOR SLEEP Patient not taking: Reported on 07/26/2022 10/18/21   Sherlene Shams, MD  vitamin B-12 (CYANOCOBALAMIN) 1000 MCG tablet Take 1,000 mcg by mouth daily.    [provider]    Physical Exam: Vitals:   08/11/22 1500 08/11/22 1525 08/11/22 1615 08/11/22 1630  BP: (!) 133/59   134/72  Pulse: (!) 107 (!) 104 95 (!) 108  Resp: (!) 22 (!) 25  20  Temp:      TempSrc:      SpO2: 96% 98% 95% 97%  Weight:      Height:       General: Not in acute distress HEENT:       Eyes: PERRL, EOMI, no scleral icterus.       ENT: No discharge from the ears and nose, no pharynx injection, no tonsillar enlargement.        Neck: No JVD, no bruit, no mass felt. Heme: No neck lymph node enlargement. Cardiac: S1/S2, RRR, No murmurs, No gallops or rubs. Respiratory: Has wheezing bilaterally GI: Soft, nondistended, nontender, no rebound pain, no organomegaly, BS present. GU: No hematuria Ext: No pitting leg edema bilaterally. 1+DP/PT pulse bilaterally. Musculoskeletal: No joint deformities, No joint redness or warmth, no limitation of ROM in spin. Skin: No rashes.  Neuro: Alert, oriented X3, cranial nerves II-XII grossly intact, moves all extremities normally.  Psych: Patient is not psychotic, no suicidal or hemocidal ideation.  Labs on Admission: I have personally reviewed following labs and imaging studies  CBC: Recent Labs  Lab 08/11/22 1233  WBC 5.5  NEUTROABS 3.9  HGB 11.2*  HCT 33.5*  MCV 107.4*  PLT 115*   Basic Metabolic Panel: Recent Labs  Lab 08/11/22 1233  NA 137  K 3.7  CL 104  CO2 24  GLUCOSE 107*  BUN 15  CREATININE 0.84  CALCIUM 9.0   GFR: Estimated Creatinine Clearance: 59.3 mL/min (by C-G formula based on SCr of 0.84 mg/dL). Liver Function Tests: Recent Labs  Lab 08/11/22 1233  AST 80*  ALT 83*  ALKPHOS 98  BILITOT 0.6  PROT 7.1  ALBUMIN 4.0   No results for input(s): "LIPASE", "AMYLASE" in the last 168 hours. No results for input(s):  "AMMONIA" in the last 168 hours. Coagulation Profile: No results for input(s): "INR", "PROTIME" in the last 168 hours. Cardiac Enzymes: No results for input(s): "CKTOTAL", "CKMB", "CKMBINDEX", "TROPONINI" in the last 168 hours. BNP (last 3 results) No results for input(s): "PROBNP" in the last 8760 hours. HbA1C: No results for input(s): "HGBA1C" in the last 72 hours. CBG: No results for input(s): "GLUCAP" in the last  168 hours. Lipid Profile: No results for input(s): "CHOL", "HDL", "LDLCALC", "TRIG", "CHOLHDL", "LDLDIRECT" in the last 72 hours. Thyroid Function Tests: No results for input(s): "TSH", "T4TOTAL", "FREET4", "T3FREE", "THYROIDAB" in the last 72 hours. Anemia Panel: No results for input(s): "VITAMINB12", "FOLATE", "FERRITIN", "TIBC", "IRON", "RETICCTPCT" in the last 72 hours. Urine analysis:    Component Value Date/Time   COLORURINE YELLOW 05/18/2022 1424   APPEARANCEUR Cloudy (A) 05/18/2022 1424   LABSPEC 1.020 05/18/2022 1424   PHURINE 6.0 05/18/2022 1424   GLUCOSEU NEGATIVE 05/18/2022 1424   HGBUR SMALL (A) 05/18/2022 1424   BILIRUBINUR NEGATIVE 05/18/2022 1424   BILIRUBINUR Negative 04/29/2016 1546   KETONESUR NEGATIVE 05/18/2022 1424   PROTEINUR NEGATIVE 03/05/2021 1046   UROBILINOGEN 0.2 05/18/2022 1424   NITRITE POSITIVE (A) 05/18/2022 1424   LEUKOCYTESUR LARGE (A) 05/18/2022 1424   Sepsis Labs: @LABRCNTIP (procalcitonin:4,lacticidven:4) )No results found for this or any previous visit (from the past 240 hour(s)).   Radiological Exams on Admission: DG Chest 2 View  Result Date: 08/11/2022 CLINICAL DATA:  SOB, Wheezing EXAM: CHEST - 2 VIEW COMPARISON:  CXR 04/07/22 FINDINGS: No pleural effusion. No pneumothorax. Normal cardiac and mediastinal contours. New patchy airspace opacities in the right mid and lower lung fields are worrisome for infection. No radiographically apparent displaced rib fractures. Cervical spinal hardware in place. Visualized upper abdomen  is unremarkable. IMPRESSION: New patchy airspace opacities in right mid and lower lung fields are worrisome for infection. Electronically Signed   By: Lorenza Cambridge M.D.   On: 08/11/2022 13:30      Assessment/Plan Principal Problem:   CAP (community acquired pneumonia) Active Problems:   Asthma exacerbation   Essential hypertension   Hyperlipidemia   Hypothyroid   Thrombocytopenia   MDS (myelodysplastic syndrome), low grade   Depression   Assessment and Plan:   CAP (community acquired pneumonia) and asthma exacerbation: Patient has 2 L new oxygen requirement.  Does not meets criteria for sepsis.  - Will admit to tele bed as inpt - IV Rocephin and doxycycline (patient received 1 dose of Augmentin in ED) - Solu-Medrol 40 mg twice daily - Mucinex for cough  - Bronchodilators - Urine legionella and S. pneumococcal antigen - Follow up blood culture x2, sputum culture  Essential hypertension -IV hydralazine as needed -Amlodipine  Hyperlipidemia -Crestor  Hypothyroid -Synthroid  Thrombocytopenia: This is chronic issue.  Platelet 115, no active bleeding -Follow-up with CBC  MDS (myelodysplastic syndrome), low grade: Hemoglobin stable 11.2 (11 point 10/25/2022. -Patient is following up with Dr. Donneta Romberg  Depression -Continue home medications    DVT ppx: SQ Lovenox  Code Status: Full code  Family Communication: I offered to call her family, but patient states that she spoke with her sister already, she does not need me to call her family  Disposition Plan:  Anticipate discharge back to previous environment  Consults called:  none  Admission status and Level of care: Telemetry Medical:    as inpt       Dispo: The patient is from: Home              Anticipated d/c is to: Home              Anticipated d/c date is: 2 days              Patient currently is not medically stable to d/c.    Severity of Illness:  The appropriate patient status for this patient is  INPATIENT. Inpatient status is judged to be  reasonable and necessary in order to provide the required intensity of service to ensure the patient's safety. The patient's presenting symptoms, physical exam findings, and initial radiographic and laboratory data in the context of their chronic comorbidities is felt to place them at high risk for further clinical deterioration. Furthermore, it is not anticipated that the patient will be medically stable for discharge from the hospital within 2 midnights of admission.   * I certify that at the point of admission it is my clinical judgment that the patient will require inpatient hospital care spanning beyond 2 midnights from the point of admission due to high intensity of service, high risk for further deterioration and high frequency of surveillance required.*       Date of Service 08/11/2022    Lorretta Harp [facility]   If 7PM-7AM, please contact night-coverage [email or web address] 08/11/2022, 5:07 PM

## 2022-08-11 NOTE — ED Triage Notes (Signed)
Patient brought over from [facility]. Presents with SOB, wheezing, and cough since Monday. Reports SOB has gradually gotten worse.   Reports fevers at home but never checked temp.

## 2022-08-11 NOTE — Discharge Instructions (Addendum)
Take antibiotics, prednisone, and inhalers as prescribed for the full course.  Call your doctor for an appointment this week.  If you have any new, worsening, unexpected symptoms then come back to the emergency department for recheck.

## 2022-08-11 NOTE — ED Provider Notes (Addendum)
[facility] Provider Note    Event Date/Time   First MD Initiated Contact with Patient 08/11/22 1317     (approximate)   History   Shortness of Breath and Wheezing   HPI  Sarah [name] is a 72 y.o. female   Past medical history of asthma, hypothyroid, hypertension who presents emergency department with 2 days of subjective fever, chills, productive cough, body aches, shortness of breath and wheezing.  She denies chest pain or leg swelling or pain.  Independent Historian contributed to assessment above: Friend who is at bedside corroborates information given above  External Medical Documents Reviewed: She had an office visit from earlier today asking her to come to the emergency department for further evaluation of her respiratory symptoms.      Physical Exam   Triage Vital Signs: ED Triage Vitals [08/11/22 1230]  Enc Vitals Group     BP 114/61     Pulse Rate 84     Resp (!) 22     Temp 98.4 F (36.9 C)     Temp Source Oral     SpO2 95 %     Weight 174 lb (78.9 kg)     Height  (1.6 m)     Head Circumference      Peak Flow      Pain Score 0     Pain Loc      Pain Edu?      Excl. in GC?     Most recent vital signs: Vitals:   08/11/22 1415 08/11/22 1417  BP:    Pulse:    Resp:    Temp:    SpO2: 90% 93%    General: Awake, no distress.  CV:  Good peripheral perfusion.  Resp:  Normal effort.  Abd:  No distention.  Other:  She has mild tachypnea and wheezing throughout both lung fields without focality.  No fever.  Oxygen saturation 95% on room air.  Looks nontoxic comfortable.   ED Results / Procedures / Treatments   Labs (all labs ordered are listed, but only abnormal results are displayed) Labs Reviewed  CBC WITH DIFFERENTIAL/PLATELET - Abnormal; Notable for the following components:      Result Value   RBC 3.12 (*)    Hemoglobin 11.2 (*)    HCT 33.5 (*)    MCV 107.4 (*)    MCH 35.9 (*)    Platelets 115 (*)     Abs Immature Granulocytes 0.30 (*)    All other components within normal limits  COMPREHENSIVE METABOLIC PANEL - Abnormal; Notable for the following components:   Glucose, Bld 107 (*)    AST 80 (*)    ALT 83 (*)    All other components within normal limits     I ordered and reviewed the above labs they are notable for normal white blood cell count.  EKG  ED ECG REPORT I, Pilar Jarvis, the attending physician, personally viewed and interpreted this ECG.   Date: 08/11/2022  EKG Time: 1231  Rate: 87  Rhythm: sinus  Axis: nl  Intervals:none  ST&T Change: No acute ischemic changes    RADIOLOGY I independently reviewed and interpreted chest x-ray and see a focality in the right midlung field concerning for pneumonia   PROCEDURES:  Critical Care performed: Yes, see critical care procedure note(s)  .Critical Care  Performed by: Pilar Jarvis, MD Authorized by: Pilar Jarvis, MD   Critical care provider statement:    Critical  care time (minutes):  30   Critical care was time spent personally by me on the following activities:  Development of treatment plan with patient or surrogate, discussions with consultants, evaluation of patient's response to treatment, examination of patient, ordering and review of laboratory studies, ordering and review of radiographic studies, ordering and performing treatments and interventions, pulse oximetry, re-evaluation of patient's condition and review of old charts    MEDICATIONS ORDERED IN ED: Medications  ipratropium-albuterol (DUONEB) 0.5-2.5 (3) MG/3ML nebulizer solution 3 mL (3 mLs Nebulization Given 08/11/22 1235)  amoxicillin-clavulanate (AUGMENTIN) 875-125 MG per tablet 1 tablet (1 tablet Oral Given 08/11/22 1356)  doxycycline (VIBRA-TABS) tablet 100 mg (100 mg Oral Given 08/11/22 1356)  ipratropium-albuterol (DUONEB) 0.5-2.5 (3) MG/3ML nebulizer solution 3 mL (3 mLs Nebulization Given 08/11/22 1356)  predniSONE (DELTASONE) tablet 60 mg (60 mg  Oral Given 08/11/22 1356)     IMPRESSION / MDM / ASSESSMENT AND PLAN / ED COURSE  I reviewed the triage vital signs and the nursing notes.                                Patient's presentation is most consistent with acute presentation with potential threat to life or bodily function.  Differential diagnosis includes, but is not limited to, pneumonia, viral URI, asthma exacerbation, sepsis   The patient is on the cardiac monitor to evaluate for evidence of arrhythmia and/or significant heart rate changes.  MDM: This is a patient with pneumonia on chest x-ray and symptoms consistent with respiratory infectious symptoms and history of asthma who has an acute asthma exacerbation with wheezing on auscultation.  She is nontoxic no white blood cell count no fever normal hemodynamics other than some mild tachypnea.  Her oxygen saturation level is normal.  I will treat her with an outpatient course of antibiotics for community-acquired pneumonia with the first dose in the emergency department and asthma treatment with DuoNebs and prednisone course.    Recheck  of vital signs show hypoxemia 85% on room air.  Ambulation trial in the emergency department led to significant shortness of breath and increased work of breathing.  Better when at rest.  We will place on 2 L nasal cannula and admit given hypoxemia and work of breathing.       FINAL CLINICAL IMPRESSION(S) / ED DIAGNOSES   Final diagnoses:  Mild intermittent asthma with exacerbation  Community acquired pneumonia of right middle lobe of lung     Rx / DC Orders   ED Discharge Orders          Ordered    amoxicillin-clavulanate (AUGMENTIN) 875-125 MG tablet  2 times daily        08/11/22 1356    doxycycline (VIBRAMYCIN) 50 MG capsule  2 times daily        08/11/22 1356    predniSONE (DELTASONE) 50 MG tablet        08/11/22 1356    albuterol (VENTOLIN HFA) 108 (90 Base) MCG/ACT inhaler  Every 6 hours PRN        08/11/22  1356             Note:  This document was prepared using Dragon voice recognition software and may include unintentional dictation errors.    Pilar Jarvis, MD 08/11/22 1357    Pilar Jarvis, MD 08/11/22 [phone]

## 2022-08-12 DIAGNOSIS — E785 Hyperlipidemia, unspecified: Secondary | ICD-10-CM | POA: Diagnosis not present

## 2022-08-12 DIAGNOSIS — J189 Pneumonia, unspecified organism: Secondary | ICD-10-CM | POA: Diagnosis not present

## 2022-08-12 DIAGNOSIS — D46Z Other myelodysplastic syndromes: Secondary | ICD-10-CM

## 2022-08-12 DIAGNOSIS — E039 Hypothyroidism, unspecified: Secondary | ICD-10-CM | POA: Diagnosis not present

## 2022-08-12 DIAGNOSIS — J4541 Moderate persistent asthma with (acute) exacerbation: Secondary | ICD-10-CM | POA: Diagnosis not present

## 2022-08-12 DIAGNOSIS — F329 Major depressive disorder, single episode, unspecified: Secondary | ICD-10-CM

## 2022-08-12 LAB — BASIC METABOLIC PANEL
Anion gap: 8 (ref 5–15)
BUN: 15 mg/dL (ref 8–23)
CO2: 25 mmol/L (ref 22–32)
Calcium: 9.2 mg/dL (ref 8.9–10.3)
Chloride: 105 mmol/L (ref 98–111)
Creatinine, Ser: 0.79 mg/dL (ref 0.44–1.00)
GFR, Estimated: >60 mL/min (ref >60)
Glucose, Bld: 205 mg/dL — ABNORMAL HIGH (ref 70–99)
Potassium: 3.9 mmol/L (ref 3.5–5.1)
Sodium: 138 mmol/L (ref 135–145)

## 2022-08-12 LAB — CBC
HCT: 29.6 % — ABNORMAL LOW (ref 36.0–46.0)
Hemoglobin: 9.9 g/dL — ABNORMAL LOW (ref 12.0–15.0)
MCH: 35.9 pg — ABNORMAL HIGH (ref 26.0–34.0)
MCHC: 33.4 g/dL (ref 30.0–36.0)
MCV: 107.2 fL — ABNORMAL HIGH (ref 80.0–100.0)
Platelets: 91 10*3/uL — ABNORMAL LOW (ref 150–400)
RBC: 2.76 MIL/uL — ABNORMAL LOW (ref 3.87–5.11)
RDW: 13.5 % (ref 11.5–15.5)
WBC: 4.6 10*3/uL (ref 4.0–10.5)
nRBC: 0 % (ref 0.0–0.2)

## 2022-08-12 LAB — STREP PNEUMONIAE URINARY ANTIGEN: Strep Pneumo Urinary Antigen: NEGATIVE

## 2022-08-12 LAB — CULTURE, BLOOD (ROUTINE X 2)

## 2022-08-12 LAB — CULTURE, RESPIRATORY W GRAM STAIN

## 2022-08-12 MED ORDER — PREDNISONE 20 MG PO TABS
50.0000 mg | ORAL_TABLET | Freq: Every day | ORAL | Status: DC
  Administered 2022-08-12 – 2022-08-13 (×2): 50 mg via ORAL
  Filled 2022-08-12 (×2): qty 1

## 2022-08-12 MED ORDER — SODIUM CHLORIDE 0.9 % IV SOLN: INTRAVENOUS | Status: DC | PRN

## 2022-08-12 MED ORDER — AMOXICILLIN-POT CLAVULANATE 875-125 MG PO TABS
1.0000 | ORAL_TABLET | Freq: Two times a day (BID) | ORAL | Status: DC
  Administered 2022-08-12 – 2022-08-13 (×2): 1 via ORAL
  Filled 2022-08-12 (×2): qty 1

## 2022-08-12 MED ORDER — AZITHROMYCIN 250 MG PO TABS
500.0000 mg | ORAL_TABLET | Freq: Every day | ORAL | Status: DC
  Administered 2022-08-12 – 2022-08-13 (×2): 500 mg via ORAL
  Filled 2022-08-12 (×2): qty 2

## 2022-08-12 NOTE — Progress Notes (Signed)
  Transition of Care [facility]) Screening Note   Patient Details  Name: Sarah [name] Date of Birth: Mar 08, 1951   Transition of Care [facility] CM/SW Contact:    Chapman Fitch, RN Phone Number: 08/12/2022, 2:21 PM    Transition of Care Department [facility]) has reviewed patient and no TOC needs have been identified at this time. We will continue to monitor patient advancement through interdisciplinary progression rounds. If new patient transition needs arise, please place a TOC consult.  Patient currently requiring acute O2 with plans to wean prior to discharge

## 2022-08-12 NOTE — Progress Notes (Signed)
PROGRESS NOTE    Sarah [name]   ZOX:[id number] DOB: 02/28/1951  DOA: 08/11/2022 Date of Service: 08/12/22 PCP: Sherlene Shams, MD     Brief Narrative / Hospital Course:  Sarah [name] is a 72 y.o. female with medical history significant of asthma, hypertension, hyperlipidemia, prediabetes, hypothyroidism, depression, GERD, obesity, anemia, thrombocytopenia, MDS low-grade, who presents to ED 08/11/2022 with shortness of breath.  04/18: (+)CXR for pneumonia, new O2 requirement (87% on room air, which improved to 90-100% on 2 L), admitted to hospitalist service. Briefly tachycardic/tachypneic, so SIRS/Sepsis criteria met but resolved as of the evening.  04/19: Remains on 2L O2. Cultures pending.   Consultants:  none  Procedures: none      ASSESSMENT & PLAN:   Principal Problem:   CAP (community acquired pneumonia) Active Problems:   Asthma exacerbation   Essential hypertension   Hyperlipidemia   Hypothyroid   Thrombocytopenia   MDS (myelodysplastic syndrome), low grade   Depression  CAP (community acquired pneumonia) and asthma exacerbation: Patient has 2 L new oxygen requirement.  No longer meets criteria for sepsis as of time of admission IV Rocephin and doxycycline (patient received 1 dose of Augmentin in ED) Solu-Medrol 40 mg twice daily Mucinex for cough  Bronchodilators Urine legionella and S. pneumococcal antigen Follow up blood culture x2, sputum culture   Essential hypertension IV hydralazine as needed Amlodipine  Hyperlipidemia Crestor   Hypothyroid Synthroid   Thrombocytopenia:  chronic issue.  Platelet 115, no active bleeding Follow-up with CBC Patient is following up with Dr. Donneta Romberg Stop lovenox today as Plt <100   MDS (myelodysplastic syndrome), low grade:  Hemoglobin stable 11.2  Patient is following up with Dr. Donneta Romberg   Depression Continue home medications    DVT prophylaxis: SCD Pertinent IV fluids/nutrition: no  continuous IV fluids  Central lines / invasive devices: none  Code Status: FULL CODE ACP documentation reviewed: none on file   Current Admission Status: inpatient  TOC needs / Dispo plan: TBD, anticipate return to previous home environment, PT/OT to see Barriers to discharge / significant pending items: O2 requirement, clinical improvement, culture results - anticipate discharge 1-3 days              Subjective / Brief ROS:  Patient reports feeling much better today Denies CP (+)SOB on exertion .  Pain controlled.  Denies new weakness.  Tolerating diet.  Reports no concerns w/ urination/defecation.   Family Communication: none at this time     Objective Findings:  Vitals:   08/12/22 1222 08/12/22 1228 08/12/22 1316 08/12/22 1507  BP:    (!) 113/58  Pulse:    86  Resp:    18  Temp:    98 F (36.7 C)  TempSrc:    Oral  SpO2: (!) 86% 91% 92% 95%  Weight:      Height:        Intake/Output Summary (Last 24 hours) at 08/12/2022 1644 Last data filed at 08/12/2022 1350 Gross per 24 hour  Intake 240 ml  Output --  Net 240 ml   Filed Weights   08/11/22 1230  Weight: 78.9 kg    Examination:  Physical Exam Constitutional:      General: She is not in acute distress.    Appearance: She is not ill-appearing.  Cardiovascular:     Rate and Rhythm: Normal rate and regular rhythm.  Pulmonary:     Breath sounds: Examination of the right-upper field reveals wheezing and rhonchi. Examination of  the left-upper field reveals wheezing and rhonchi. Examination of the right-middle field reveals wheezing and rhonchi. Examination of the left-middle field reveals wheezing and rhonchi. Examination of the right-lower field reveals decreased breath sounds. Examination of the left-lower field reveals decreased breath sounds. Decreased breath sounds, wheezing and rhonchi present.  Abdominal:     Palpations: Abdomen is soft.  Musculoskeletal:     Right lower leg: No edema.      Left lower leg: No edema.  Neurological:     General: No focal deficit present.     Mental Status: She is alert and oriented to person, place, and time.  Psychiatric:        Mood and Affect: Mood normal.        Behavior: Behavior normal.          Scheduled Medications:   amLODipine  5 mg Oral Daily   amoxicillin-clavulanate  1 tablet Oral Q12H   azithromycin  500 mg Oral Daily   calcium carbonate  1 tablet Oral BID   citalopram  20 mg Oral Daily   cyanocobalamin  1,000 mcg Oral Daily   famotidine  20 mg Oral Daily   ferrous sulfate  324 mg Oral Q breakfast   fesoterodine  4 mg Oral Daily   gabapentin  300 mg Oral QHS   ipratropium-albuterol  3 mL Nebulization Q6H   levothyroxine  50 mcg Oral Q0600   loratadine  10 mg Oral Daily   pantoprazole  40 mg Oral Daily   predniSONE  50 mg Oral Q breakfast   rosuvastatin  10 mg Oral Daily    Continuous Infusions:  sodium chloride      PRN Medications:  sodium chloride, albuterol, dextromethorphan-guaiFENesin, hydrALAZINE, ibuprofen, ondansetron (ZOFRAN) IV, mouth rinse, traZODone  Antimicrobials from admission:  Anti-infectives (From admission, onward)    Start     Dose/Rate Route Frequency Ordered Stop   08/12/22 2200  amoxicillin-clavulanate (AUGMENTIN) 875-125 MG per tablet 1 tablet        1 tablet Oral Every 12 hours 08/12/22 1246 08/16/22 2159   08/12/22 1245  azithromycin (ZITHROMAX) tablet 500 mg        500 mg Oral Daily 08/12/22 1246 08/16/22 0959   08/11/22 2300  doxycycline (VIBRAMYCIN) 100 mg in sodium chloride 0.9 % 250 mL IVPB  Status:  Discontinued        100 mg 125 mL/hr over 120 Minutes Intravenous Every 12 hours 08/11/22 1453 08/12/22 1246   08/11/22 2200  cefTRIAXone (ROCEPHIN) 1 g in sodium chloride 0.9 % 100 mL IVPB  Status:  Discontinued        1 g 200 mL/hr over 30 Minutes Intravenous Every 24 hours 08/11/22 1452 08/12/22 1246   08/11/22 1345  amoxicillin-clavulanate (AUGMENTIN) 875-125 MG per tablet  1 tablet        1 tablet Oral  Once 08/11/22 1338 08/11/22 1356   08/11/22 1345  doxycycline (VIBRA-TABS) tablet 100 mg        100 mg Oral  Once 08/11/22 1338 08/11/22 1356   08/11/22 0000  amoxicillin-clavulanate (AUGMENTIN) 875-125 MG tablet        1 tablet Oral 2 times daily 08/11/22 1356 08/18/22 2359   08/11/22 0000  doxycycline (VIBRAMYCIN) 50 MG capsule        100 mg Oral 2 times daily 08/11/22 1356 08/18/22 2359           Data Reviewed:  I have personally reviewed the following...  CBC: Recent Labs  Lab 08/11/22 1233 08/12/22 0502  WBC 5.5 4.6  NEUTROABS 3.9  --   HGB 11.2* 9.9*  HCT 33.5* 29.6*  MCV 107.4* 107.2*  PLT 115* 91*   Basic Metabolic Panel: Recent Labs  Lab 08/11/22 1233 08/12/22 0502  NA 137 138  K 3.7 3.9  CL 104 105  CO2 24 25  GLUCOSE 107* 205*  BUN 15 15  CREATININE 0.84 0.79  CALCIUM 9.0 9.2   GFR: Estimated Creatinine Clearance: 62.3 mL/min (by C-G formula based on SCr of 0.79 mg/dL). Liver Function Tests: Recent Labs  Lab 08/11/22 1233  AST 80*  ALT 83*  ALKPHOS 98  BILITOT 0.6  PROT 7.1  ALBUMIN 4.0   No results for input(s): "LIPASE", "AMYLASE" in the last 168 hours. No results for input(s): "AMMONIA" in the last 168 hours. Coagulation Profile: No results for input(s): "INR", "PROTIME" in the last 168 hours. Cardiac Enzymes: No results for input(s): "CKTOTAL", "CKMB", "CKMBINDEX", "TROPONINI" in the last 168 hours. BNP (last 3 results) No results for input(s): "PROBNP" in the last 8760 hours. HbA1C: No results for input(s): "HGBA1C" in the last 72 hours. CBG: No results for input(s): "GLUCAP" in the last 168 hours. Lipid Profile: No results for input(s): "CHOL", "HDL", "LDLCALC", "TRIG", "CHOLHDL", "LDLDIRECT" in the last 72 hours. Thyroid Function Tests: No results for input(s): "TSH", "T4TOTAL", "FREET4", "T3FREE", "THYROIDAB" in the last 72 hours. Anemia Panel: No results for input(s): "VITAMINB12", "FOLATE",  "FERRITIN", "TIBC", "IRON", "RETICCTPCT" in the last 72 hours. Most Recent Urinalysis On File:     Component Value Date/Time   COLORURINE YELLOW 05/18/2022 1424   APPEARANCEUR Cloudy (A) 05/18/2022 1424   LABSPEC 1.020 05/18/2022 1424   PHURINE 6.0 05/18/2022 1424   GLUCOSEU NEGATIVE 05/18/2022 1424   HGBUR SMALL (A) 05/18/2022 1424   BILIRUBINUR NEGATIVE 05/18/2022 1424   BILIRUBINUR Negative 04/29/2016 1546   KETONESUR NEGATIVE 05/18/2022 1424   PROTEINUR NEGATIVE 03/05/2021 1046   UROBILINOGEN 0.2 05/18/2022 1424   NITRITE POSITIVE (A) 05/18/2022 1424   LEUKOCYTESUR LARGE (A) 05/18/2022 1424   Sepsis Labs: @LABRCNTIP (procalcitonin:4,lacticidven:4) Microbiology: Recent Results (from the past 240 hour(s))  Expectorated Sputum Assessment w Gram Stain, Rflx to Resp Cult     Status: None   Collection Time: 08/11/22  2:51 PM   Specimen: Sputum  Result Value Ref Range Status   Specimen Description SPUTUM  Final   Special Requests NONE  Final   Sputum evaluation   Final    THIS SPECIMEN IS ACCEPTABLE FOR SPUTUM CULTURE Performed at [facility], [address], [place], [place] [address]    Report Status 08/11/2022 FINAL  Final  Culture, Respiratory w Gram Stain     Status: None (Preliminary result)   Collection Time: 08/11/22  2:51 PM   Specimen: SPU  Result Value Ref Range Status   Specimen Description   Final    SPUTUM Performed at [facility], [address], [place], [place] [address]    Special Requests   Final    NONE Reflexed from [id number] Performed at [facility], [address] [place], [place] [address]    Gram Stain   Final    RARE WBC PRESENT, PREDOMINANTLY PMN RARE GRAM POSITIVE COCCI IN PAIRS RARE GRAM NEGATIVE RODS    Culture   Final    TOO YOUNG TO READ Performed at [facility], [address] [place], [place] [address]    Report Status PENDING  Incomplete  Culture, blood (routine  x 2) Call MD if unable to  obtain prior to antibiotics being given     Status: None (Preliminary result)   Collection Time: 08/11/22  3:32 PM   Specimen: BLOOD  Result Value Ref Range Status   Specimen Description BLOOD RIGHT ANTECUBITAL  Final   Special Requests   Final    BOTTLES DRAWN AEROBIC AND ANAEROBIC Blood Culture adequate volume   Culture   Final    NO GROWTH < 12 HOURS Performed at [facility], [address] [place], [place] [address]    Report Status PENDING  Incomplete  Culture, blood (routine x 2) Call MD if unable to obtain prior to antibiotics being given     Status: None (Preliminary result)   Collection Time: 08/11/22  3:37 PM   Specimen: BLOOD  Result Value Ref Range Status   Specimen Description BLOOD LEFT ANTECUBITAL  Final   Special Requests   Final    BOTTLES DRAWN AEROBIC AND ANAEROBIC Blood Culture results may not be optimal due to an inadequate volume of blood received in culture bottles   Culture   Final    NO GROWTH < 12 HOURS Performed at [facility], [address], [place], [place] [address]    Report Status PENDING  Incomplete      Radiology Studies last 3 days: DG Chest 2 View  Result Date: 08/11/2022 CLINICAL DATA:  SOB, Wheezing EXAM: CHEST - 2 VIEW COMPARISON:  CXR 04/07/22 FINDINGS: No pleural effusion. No pneumothorax. Normal cardiac and mediastinal contours. New patchy airspace opacities in the right mid and lower lung fields are worrisome for infection. No radiographically apparent displaced rib fractures. Cervical spinal hardware in place. Visualized upper abdomen is unremarkable. IMPRESSION: New patchy airspace opacities in right mid and lower lung fields are worrisome for infection. Electronically Signed   By: Lorenza Cambridge M.D.   On: 08/11/2022 13:30             LOS: 1 day      Sunnie Nielsen, DO [facility] 08/12/2022, 4:44 PM    Dictation software may have been used to generate the above note. Typos may occur and  escape review in typed/dictated notes. Please contact Dr Lyn Hollingshead directly for clarity if needed.  Staff may message me via secure chat in Epic  but this may not receive an immediate response,  please page me for urgent matters!  If 7PM-7AM, please contact night coverage [email or web address]

## 2022-08-12 NOTE — Hospital Course (Signed)
Sarah [name] is a 72 y.o. female with medical history significant of asthma, hypertension, hyperlipidemia, prediabetes, hypothyroidism, depression, GERD, obesity, anemia, thrombocytopenia, MDS low-grade, who presents to ED 08/11/2022 with shortness of breath.  04/18: (+)CXR for pneumonia, new O2 requirement (87% on room air, which improved to 90-100% on 2 L), admitted to hospitalist service. Briefly tachycardic/tachypneic, so SIRS/Sepsis criteria met but resolved as of the evening.  04/19: Remains on 2L O2. Cultures pending.   Consultants:  none  Procedures: none      ASSESSMENT & PLAN:   Principal Problem:   CAP (community acquired pneumonia) Active Problems:   Asthma exacerbation   Essential hypertension   Hyperlipidemia   Hypothyroid   Thrombocytopenia   MDS (myelodysplastic syndrome), low grade   Depression  CAP (community acquired pneumonia) and asthma exacerbation: Patient has 2 L new oxygen requirement.  No longer meets criteria for sepsis as of time of admission IV Rocephin and doxycycline (patient received 1 dose of Augmentin in ED) Solu-Medrol 40 mg twice daily Mucinex for cough  Bronchodilators Urine legionella and S. pneumococcal antigen Follow up blood culture x2, sputum culture   Essential hypertension IV hydralazine as needed Amlodipine  Hyperlipidemia Crestor   Hypothyroid Synthroid   Thrombocytopenia:  chronic issue.  Platelet 115, no active bleeding Follow-up with CBC Patient is following up with Dr. Donneta Romberg Stop lovenox today as Plt <100   MDS (myelodysplastic syndrome), low grade:  Hemoglobin stable 11.2  Patient is following up with Dr. Donneta Romberg   Depression Continue home medications    DVT prophylaxis: SCD Pertinent IV fluids/nutrition: no continuous IV fluids  Central lines / invasive devices: none  Code Status: FULL CODE ACP documentation reviewed: none on file   Current Admission Status: inpatient  TOC needs / Dispo  plan: TBD, anticipate return to previous home environment, PT/OT to see Barriers to discharge / significant pending items: O2 requirement, clinical improvement, culture results - anticipate discharge 1-3 days

## 2022-08-12 NOTE — Progress Notes (Signed)
Mobility Specialist - Progress Note   Pre-mobility: SpO2(96) During mobility: SpO2(93) Post-mobility: SPO2(94)     08/12/22 1400  Mobility  Activity Ambulated independently in hallway;Stood at bedside;Dangled on edge of bed  Level of Assistance Independent after set-up  Assistive Device None  Distance Ambulated (ft) 180 ft  Range of Motion/Exercises Active  Activity Response Tolerated well  Mobility Referral Yes  $Mobility charge 1 Mobility   Pt resting in bed on 1L upon entry. Pt STS and ambulates to hallway around NS for 1 and 1/2 lap with No AD indep. Pt returned to bed and left with needs in reach and bed alarm activated.   Johnathan Hausen Mobility Specialist 08/12/22, 2:18 PM

## 2022-08-13 DIAGNOSIS — J189 Pneumonia, unspecified organism: Secondary | ICD-10-CM | POA: Diagnosis not present

## 2022-08-13 DIAGNOSIS — J4541 Moderate persistent asthma with (acute) exacerbation: Secondary | ICD-10-CM | POA: Diagnosis not present

## 2022-08-13 DIAGNOSIS — E785 Hyperlipidemia, unspecified: Secondary | ICD-10-CM | POA: Diagnosis not present

## 2022-08-13 DIAGNOSIS — E039 Hypothyroidism, unspecified: Secondary | ICD-10-CM | POA: Diagnosis not present

## 2022-08-13 LAB — CULTURE, BLOOD (ROUTINE X 2)
Culture: NO GROWTH
Culture: NO GROWTH

## 2022-08-13 LAB — CULTURE, RESPIRATORY W GRAM STAIN

## 2022-08-13 MED ORDER — IPRATROPIUM-ALBUTEROL 0.5-2.5 (3) MG/3ML IN SOLN
3.0000 mL | RESPIRATORY_TRACT | 0 refills | Status: DC | PRN
Start: 2022-08-13 — End: 2022-08-19

## 2022-08-13 MED ORDER — AMOXICILLIN-POT CLAVULANATE 875-125 MG PO TABS: 1.0000 | ORAL_TABLET | Freq: Two times a day (BID) | ORAL | 0 refills | Status: DC

## 2022-08-13 MED ORDER — AZITHROMYCIN 250 MG PO TABS: 250.0000 mg | ORAL_TABLET | Freq: Every day | ORAL | 0 refills | Status: DC

## 2022-08-13 MED ORDER — ALBUTEROL SULFATE HFA 108 (90 BASE) MCG/ACT IN AERS: 1.0000 | INHALATION_SPRAY | RESPIRATORY_TRACT | 0 refills | Status: DC | PRN

## 2022-08-13 MED ORDER — IPRATROPIUM-ALBUTEROL 0.5-2.5 (3) MG/3ML IN SOLN: 3.0000 mL | Freq: Three times a day (TID) | RESPIRATORY_TRACT | Status: DC

## 2022-08-13 MED ORDER — PREDNISONE 50 MG PO TABS: 50.0000 mg | ORAL_TABLET | Freq: Every day | ORAL | 0 refills | Status: DC

## 2022-08-13 MED ORDER — DM-GUAIFENESIN ER 30-600 MG PO TB12: 1.0000 | ORAL_TABLET | Freq: Two times a day (BID) | ORAL | 0 refills | Status: DC | PRN

## 2022-08-13 NOTE — Progress Notes (Signed)
Discharge instructions reviewed with the patient. Patient sent out by wheelchair to her waiting ride

## 2022-08-13 NOTE — Discharge Summary (Signed)
Physician Discharge Summary   Patient: Sarah [name] MRN: [id number]  DOB: 05-18-1950   Admit:     Date of Admission: 08/11/2022 Admitted from: home   Discharge: Date of discharge: 08/13/22 Disposition: Home Condition at discharge: good  CODE STATUS: FULL CODE      Discharge Physician: Sunnie Nielsen, DO [facility]     PCP: Sherlene Shams, MD  Recommendations for Outpatient Follow-up:  Follow up with PCP Sherlene Shams, MD in 2 days as scheduled Please obtain labs/tests: consider CBC, BMP, repeat CXR as indicated Please follow up on the following pending results: sputum culture  PCP AND OTHER OUTPATIENT PROVIDERS: SEE BELOW FOR SPECIFIC DISCHARGE INSTRUCTIONS PRINTED FOR PATIENT IN ADDITION TO GENERIC AVS PATIENT INFO     Discharge Instructions     Diet - low sodium heart healthy   Complete by: As directed    Increase activity slowly   Complete by: As directed          Discharge Diagnoses: Principal Problem:   CAP (community acquired pneumonia) Active Problems:   Asthma exacerbation   Essential hypertension   Hyperlipidemia   Hypothyroid   Thrombocytopenia   MDS (myelodysplastic syndrome), low grade   Depression       Hospital Course: Sarah [name] is a 72 y.o. female with medical history significant of asthma, hypertension, hyperlipidemia, prediabetes, hypothyroidism, depression, GERD, obesity, anemia, thrombocytopenia, MDS low-grade, who presents to ED 08/11/2022 with shortness of breath.  04/18: (+)CXR for pneumonia, new O2 requirement (87% on room air, which improved to 90-100% on 2 L), admitted to hospitalist service. Briefly tachycardic/tachypneic, so SIRS/Sepsis criteria met but resolved as of the evening.  04/19: Remains on 2L O2. Desat substantially w/ ambulation. Cultures pending.  04/20: improved SpO2 and does not need home O2, clear for discharge and she reports has appt w/ her PCP in 2 days   Consultants:   none  Procedures: none      ASSESSMENT & PLAN:   CAP (community acquired pneumonia) and asthma exacerbation: Patient has 2 L new oxygen requirement.  No longer meets criteria for sepsis as of time of admission Urine legionella and S. pneumococcal antigen negative IV Rocephin and doxycycline (patient received 1 dose of Augmentin in ED) --> po augmentin + azithro to complete course on discharge  Solu-Medrol 40 mg twice daily --> prednisone burst to finish on discharge  Mucinex for cough  Bronchodilators Follow up blood culture x2, sputum culture   Essential hypertension Amlodipine resumed   Hyperlipidemia Crestor   Hypothyroid Synthroid   Thrombocytopenia:  chronic issue.  Platelet 115, no active bleeding Follow-up with CBC Patient is following up with Dr. Donneta Romberg   MDS (myelodysplastic syndrome), low grade:  Hemoglobin stable 11.2  Patient is following up with Dr. Donneta Romberg   Depression Continue home medications              Discharge Instructions  Allergies as of 08/13/2022   No Known Allergies      Medication List     STOP taking these medications    budesonide 0.25 MG/2ML nebulizer solution Commonly known as: PULMICORT       TAKE these medications    albuterol 108 (90 Base) MCG/ACT inhaler Commonly known as: VENTOLIN HFA Inhale 1-2 puffs into the lungs every 4 (four) hours as needed for wheezing or shortness of breath. What changed:  how much to take when to take this   amLODipine 5 MG tablet Commonly known as:  NORVASC TAKE 1 TABLET BY MOUTH DAILY   amoxicillin-clavulanate 875-125 MG tablet Commonly known as: AUGMENTIN Take 1 tablet by mouth 2 (two) times daily for 4 days.   azithromycin 250 MG tablet Commonly known as: ZITHROMAX Take 1 tablet (250 mg total) by mouth daily for 3 days.   CALCIUM CITRATE + D PO Take 1 tablet by mouth 2 (two) times daily.   citalopram 20 MG tablet Commonly known as: CELEXA TAKE 1  TABLET BY MOUTH DAILY   cyanocobalamin 1000 MCG tablet Commonly known as: VITAMIN B12 Take 1,000 mcg by mouth daily.   dextromethorphan-guaiFENesin 30-600 MG 12hr tablet Commonly known as: MUCINEX DM Take 1 tablet by mouth 2 (two) times daily as needed for cough.   famotidine 20 MG tablet Commonly known as: PEPCID TAKE 1 TABLET BY MOUTH DAILY. BEFORE DINNER   ferrous sulfate 324 MG Tbec Take 324 mg by mouth. 65 mg of elemental iron   fexofenadine 180 MG tablet Commonly known as: ALLEGRA Take 180 mg by mouth daily.   gabapentin 100 MG capsule Commonly known as: NEURONTIN Take 1 capsule (100 mg total) by mouth 3 (three) times daily. What changed:  how much to take when to take this   ipratropium-albuterol 0.5-2.5 (3) MG/3ML Soln Commonly known as: DUONEB Take 3 mLs by nebulization every 4 (four) hours as needed (wheezing or shortness of breath that does not respond to albuterol inhaler). What changed:  when to take this reasons to take this   levothyroxine 50 MCG tablet Commonly known as: SYNTHROID TAKE 1 TABLET BY MOUTH DAILY ON  EMPTY STOMACH WITH A GLASS OF  WATER AT LEAST 30 TO 60 MIN  BEFORE BREAKFAST   pantoprazole 40 MG tablet Commonly known as: PROTONIX TAKE 1 TABLET BY MOUTH DAILY   predniSONE 50 MG tablet Commonly known as: DELTASONE Take 1 tablet (50 mg total) by mouth daily with breakfast. Take 1 tablet daily for the next 3 days starting on 08/12/2022   rosuvastatin 10 MG tablet Commonly known as: CRESTOR Take 1 tablet (10 mg total) by mouth daily.   solifenacin 5 MG tablet Commonly known as: VESICARE TAKE ONE TABLET BY MOUTH EVERY DAY   traZODone 50 MG tablet Commonly known as: DESYREL TAKE 1/2 TO 1 TABLET BY MOUTH AT BEDTIME AS NEEDED FOR SLEEP   Trelegy Ellipta 100-62.5-25 MCG/ACT Aepb Generic drug: Fluticasone-Umeclidin-Vilant Inhale 1 puff into the lungs daily.         Follow-up Information     Sherlene Shams, MD. Schedule an  appointment as soon as possible for a visit .   Specialty: Internal Medicine Contact information: [address] [place] [address] [phone]                 No Known Allergies   Subjective: pt reports ambulating independently w/ minimal SOB only when talking and walking, but if not talking she is fine. NO chest pain.    Discharge Exam: BP (!) 122/59 (BP Location: Left Arm)   Pulse 80   Temp 97.9 F (36.6 C)   Resp 19   Ht  (1.6 m)   Wt 78.9 kg   SpO2 93%   BMI 30.82 kg/m  General: Pt is alert, awake, not in acute distress Cardiovascular: RRR, S1/S2 +, no rubs, no gallops Respiratory: + wheezing, no rhonchi - improved from previous  Abdominal: Soft, NT, ND, bowel sounds + Extremities: no edema, no cyanosis     The results of  significant diagnostics from this hospitalization (including imaging, microbiology, ancillary and laboratory) are listed below for reference.     Microbiology: Recent Results (from the past 240 hour(s))  Expectorated Sputum Assessment w Gram Stain, Rflx to Resp Cult     Status: None   Collection Time: 08/11/22  2:51 PM   Specimen: Sputum  Result Value Ref Range Status   Specimen Description SPUTUM  Final   Special Requests NONE  Final   Sputum evaluation   Final    THIS SPECIMEN IS ACCEPTABLE FOR SPUTUM CULTURE Performed at [facility], [address] [place], [place] [address]    Report Status 08/11/2022 FINAL  Final  Culture, Respiratory w Gram Stain     Status: None (Preliminary result)   Collection Time: 08/11/22  2:51 PM   Specimen: SPU  Result Value Ref Range Status   Specimen Description   Final    SPUTUM Performed at [facility], [address], [place], [place] [address]    Special Requests   Final    NONE Reflexed from [id number] Performed at [facility], [address] [place], [place] [address]    Gram Stain   Final    RARE WBC PRESENT, PREDOMINANTLY  PMN RARE GRAM POSITIVE COCCI IN PAIRS RARE GRAM NEGATIVE RODS    Culture   Final    CULTURE REINCUBATED FOR BETTER GROWTH Performed at [facility], [address], [place], [place] [address]    Report Status PENDING  Incomplete  Culture, blood (routine x 2) Call MD if unable to obtain prior to antibiotics being given     Status: None (Preliminary result)   Collection Time: 08/11/22  3:32 PM   Specimen: BLOOD  Result Value Ref Range Status   Specimen Description BLOOD RIGHT ANTECUBITAL  Final   Special Requests   Final    BOTTLES DRAWN AEROBIC AND ANAEROBIC Blood Culture adequate volume   Culture   Final    NO GROWTH 2 DAYS Performed at [facility], [address] [place], [place] [address]    Report Status PENDING  Incomplete  Culture, blood (routine x 2) Call MD if unable to obtain prior to antibiotics being given     Status: None (Preliminary result)   Collection Time: 08/11/22  3:37 PM   Specimen: BLOOD  Result Value Ref Range Status   Specimen Description BLOOD LEFT ANTECUBITAL  Final   Special Requests   Final    BOTTLES DRAWN AEROBIC AND ANAEROBIC Blood Culture results may not be optimal due to an inadequate volume of blood received in culture bottles   Culture   Final    NO GROWTH 2 DAYS Performed at [facility], [address], [place], [place] [address]    Report Status PENDING  Incomplete     Labs: BNP (last 3 results) No results for input(s): "BNP" in the last 8760 hours. Basic Metabolic Panel: Recent Labs  Lab 08/11/22 1233 08/12/22 0502  NA 137 138  K 3.7 3.9  CL 104 105  CO2 24 25  GLUCOSE 107* 205*  BUN 15 15  CREATININE 0.84 0.79  CALCIUM 9.0 9.2   Liver Function Tests: Recent Labs  Lab 08/11/22 1233  AST 80*  ALT 83*  ALKPHOS 98  BILITOT 0.6  PROT 7.1  ALBUMIN 4.0   No results for input(s): "LIPASE", "AMYLASE" in the last 168 hours. No results for input(s): "AMMONIA" in the last 168 hours. CBC: Recent  Labs  Lab 08/11/22 1233 08/12/22 0502  WBC 5.5 4.6  NEUTROABS 3.9  --   HGB 11.2* 9.9*  HCT 33.5* 29.6*  MCV 107.4* 107.2*  PLT 115* 91*   Cardiac Enzymes: No results for input(s): "CKTOTAL", "CKMB", "CKMBINDEX", "TROPONINI" in the last 168 hours. BNP: Invalid input(s): "POCBNP" CBG: No results for input(s): "GLUCAP" in the last 168 hours. D-Dimer No results for input(s): "DDIMER" in the last 72 hours. Hgb A1c No results for input(s): "HGBA1C" in the last 72 hours. Lipid Profile No results for input(s): "CHOL", "HDL", "LDLCALC", "TRIG", "CHOLHDL", "LDLDIRECT" in the last 72 hours. Thyroid function studies No results for input(s): "TSH", "T4TOTAL", "T3FREE", "THYROIDAB" in the last 72 hours.  Invalid input(s): "FREET3" Anemia work up No results for input(s): "VITAMINB12", "FOLATE", "FERRITIN", "TIBC", "IRON", "RETICCTPCT" in the last 72 hours. Urinalysis    Component Value Date/Time   COLORURINE YELLOW 05/18/2022 1424   APPEARANCEUR Cloudy (A) 05/18/2022 1424   LABSPEC 1.020 05/18/2022 1424   PHURINE 6.0 05/18/2022 1424   GLUCOSEU NEGATIVE 05/18/2022 1424   HGBUR SMALL (A) 05/18/2022 1424   BILIRUBINUR NEGATIVE 05/18/2022 1424   BILIRUBINUR Negative 04/29/2016 1546   KETONESUR NEGATIVE 05/18/2022 1424   PROTEINUR NEGATIVE 03/05/2021 1046   UROBILINOGEN 0.2 05/18/2022 1424   NITRITE POSITIVE (A) 05/18/2022 1424   LEUKOCYTESUR LARGE (A) 05/18/2022 1424   Sepsis Labs Recent Labs  Lab 08/11/22 1233 08/12/22 0502  WBC 5.5 4.6   Microbiology Recent Results (from the past 240 hour(s))  Expectorated Sputum Assessment w Gram Stain, Rflx to Resp Cult     Status: None   Collection Time: 08/11/22  2:51 PM   Specimen: Sputum  Result Value Ref Range Status   Specimen Description SPUTUM  Final   Special Requests NONE  Final   Sputum evaluation   Final    THIS SPECIMEN IS ACCEPTABLE FOR SPUTUM CULTURE Performed at [facility], [address], [place],  [place] [address]    Report Status 08/11/2022 FINAL  Final  Culture, Respiratory w Gram Stain     Status: None (Preliminary result)   Collection Time: 08/11/22  2:51 PM   Specimen: SPU  Result Value Ref Range Status   Specimen Description   Final    SPUTUM Performed at [facility], [address], [place], [place] [address]    Special Requests   Final    NONE Reflexed from [id number] Performed at [facility], [address] [place], [place] [address]    Gram Stain   Final    RARE WBC PRESENT, PREDOMINANTLY PMN RARE GRAM POSITIVE COCCI IN PAIRS RARE GRAM NEGATIVE RODS    Culture   Final    CULTURE REINCUBATED FOR BETTER GROWTH Performed at [facility], [address], [place], [place] [address]    Report Status PENDING  Incomplete  Culture, blood (routine x 2) Call MD if unable to obtain prior to antibiotics being given     Status: None (Preliminary result)   Collection Time: 08/11/22  3:32 PM   Specimen: BLOOD  Result Value Ref Range Status   Specimen Description BLOOD RIGHT ANTECUBITAL  Final   Special Requests   Final    BOTTLES DRAWN AEROBIC AND ANAEROBIC Blood Culture adequate volume   Culture   Final    NO GROWTH 2 DAYS Performed at [facility], [address], [place], [place] [address]    Report Status PENDING  Incomplete  Culture, blood (routine x 2) Call MD  if unable to obtain prior to antibiotics being given     Status: None (Preliminary result)   Collection Time: 08/11/22  3:37 PM   Specimen: BLOOD  Result Value Ref Range Status   Specimen Description BLOOD LEFT ANTECUBITAL  Final   Special Requests   Final    BOTTLES DRAWN AEROBIC AND ANAEROBIC Blood Culture results may not be optimal due to an inadequate volume of blood received in culture bottles   Culture   Final    NO GROWTH 2 DAYS Performed at [facility], [address], [place], [place] [address]    Report Status PENDING  Incomplete   Imaging DG Chest 2  View  Result Date: 08/11/2022 CLINICAL DATA:  SOB, Wheezing EXAM: CHEST - 2 VIEW COMPARISON:  CXR 04/07/22 FINDINGS: No pleural effusion. No pneumothorax. Normal cardiac and mediastinal contours. New patchy airspace opacities in the right mid and lower lung fields are worrisome for infection. No radiographically apparent displaced rib fractures. Cervical spinal hardware in place. Visualized upper abdomen is unremarkable. IMPRESSION: New patchy airspace opacities in right mid and lower lung fields are worrisome for infection. Electronically Signed   By: Lorenza Cambridge M.D.   On: 08/11/2022 13:30      Time coordinating discharge: over 30 minutes  SIGNED:  Sunnie Nielsen DO [facility]

## 2022-08-13 NOTE — Progress Notes (Signed)
SATURATION QUALIFICATIONS: (This note is used to comply with regulatory documentation for home oxygen)  Patient Saturations on Room Air at Rest = 95%  Patient Saturations on Room Air while Ambulating = 91%   Please briefly explain why patient needs home oxygen: Patient does not qualify for home oxygen

## 2022-08-14 LAB — CULTURE, BLOOD (ROUTINE X 2): Special Requests: ADEQUATE

## 2022-08-14 LAB — CULTURE, RESPIRATORY W GRAM STAIN: Culture: NORMAL

## 2022-08-15 ENCOUNTER — Emergency Department: Payer: Medicare Other

## 2022-08-15 ENCOUNTER — Ambulatory Visit (INDEPENDENT_AMBULATORY_CARE_PROVIDER_SITE_OTHER): Payer: Medicare Other | Admitting: Internal Medicine

## 2022-08-15 ENCOUNTER — Other Ambulatory Visit: Payer: Self-pay

## 2022-08-15 ENCOUNTER — Encounter: Payer: Self-pay | Admitting: Internal Medicine

## 2022-08-15 ENCOUNTER — Encounter: Payer: Self-pay | Admitting: Emergency Medicine

## 2022-08-15 ENCOUNTER — Inpatient Hospital Stay
Admission: EM | Admit: 2022-08-15 | Discharge: 2022-08-19 | DRG: 194 | Disposition: A | Discharge status: Home / Self Care | Payer: Medicare Other | Attending: Hospitalist | Admitting: Hospitalist

## 2022-08-15 ENCOUNTER — Telehealth: Payer: Self-pay | Admitting: *Deleted

## 2022-08-15 ENCOUNTER — Inpatient Hospital Stay: Payer: Medicare Other

## 2022-08-15 VITALS — BP 128/64 | HR 90 | Temp 97.9°F | Ht 63.0 in | Wt 172.6 lb

## 2022-08-15 DIAGNOSIS — F419 Anxiety disorder, unspecified: Secondary | ICD-10-CM | POA: Diagnosis present

## 2022-08-15 DIAGNOSIS — R739 Hyperglycemia, unspecified: Secondary | ICD-10-CM | POA: Diagnosis not present

## 2022-08-15 DIAGNOSIS — E669 Obesity, unspecified: Secondary | ICD-10-CM | POA: Diagnosis present

## 2022-08-15 DIAGNOSIS — J440 Chronic obstructive pulmonary disease with acute lower respiratory infection: Secondary | ICD-10-CM | POA: Diagnosis present

## 2022-08-15 DIAGNOSIS — M1990 Unspecified osteoarthritis, unspecified site: Secondary | ICD-10-CM | POA: Diagnosis present

## 2022-08-15 DIAGNOSIS — I10 Essential (primary) hypertension: Secondary | ICD-10-CM | POA: Diagnosis not present

## 2022-08-15 DIAGNOSIS — E876 Hypokalemia: Secondary | ICD-10-CM | POA: Diagnosis not present

## 2022-08-15 DIAGNOSIS — R0603 Acute respiratory distress: Secondary | ICD-10-CM

## 2022-08-15 DIAGNOSIS — R0602 Shortness of breath: Secondary | ICD-10-CM | POA: Diagnosis not present

## 2022-08-15 DIAGNOSIS — T380X5A Adverse effect of glucocorticoids and synthetic analogues, initial encounter: Secondary | ICD-10-CM | POA: Diagnosis present

## 2022-08-15 DIAGNOSIS — L309 Dermatitis, unspecified: Secondary | ICD-10-CM | POA: Diagnosis present

## 2022-08-15 DIAGNOSIS — Z6830 Body mass index (BMI) 30.0-30.9, adult: Secondary | ICD-10-CM

## 2022-08-15 DIAGNOSIS — Z8249 Family history of ischemic heart disease and other diseases of the circulatory system: Secondary | ICD-10-CM | POA: Diagnosis not present

## 2022-08-15 DIAGNOSIS — K219 Gastro-esophageal reflux disease without esophagitis: Secondary | ICD-10-CM | POA: Diagnosis present

## 2022-08-15 DIAGNOSIS — R7303 Prediabetes: Secondary | ICD-10-CM | POA: Diagnosis present

## 2022-08-15 DIAGNOSIS — J189 Pneumonia, unspecified organism: Secondary | ICD-10-CM | POA: Diagnosis present

## 2022-08-15 DIAGNOSIS — J45901 Unspecified asthma with (acute) exacerbation: Secondary | ICD-10-CM | POA: Diagnosis not present

## 2022-08-15 DIAGNOSIS — J441 Chronic obstructive pulmonary disease with (acute) exacerbation: Secondary | ICD-10-CM | POA: Diagnosis not present

## 2022-08-15 DIAGNOSIS — Z1152 Encounter for screening for COVID-19: Secondary | ICD-10-CM

## 2022-08-15 DIAGNOSIS — Z83438 Family history of other disorder of lipoprotein metabolism and other lipidemia: Secondary | ICD-10-CM

## 2022-08-15 DIAGNOSIS — J123 Human metapneumovirus pneumonia: Principal | ICD-10-CM | POA: Diagnosis present

## 2022-08-15 DIAGNOSIS — E785 Hyperlipidemia, unspecified: Secondary | ICD-10-CM

## 2022-08-15 DIAGNOSIS — Z833 Family history of diabetes mellitus: Secondary | ICD-10-CM

## 2022-08-15 DIAGNOSIS — Z79899 Other long term (current) drug therapy: Secondary | ICD-10-CM | POA: Diagnosis not present

## 2022-08-15 DIAGNOSIS — Z79890 Hormone replacement therapy (postmenopausal): Secondary | ICD-10-CM | POA: Diagnosis not present

## 2022-08-15 DIAGNOSIS — E039 Hypothyroidism, unspecified: Secondary | ICD-10-CM | POA: Diagnosis present

## 2022-08-15 DIAGNOSIS — F32A Depression, unspecified: Secondary | ICD-10-CM | POA: Diagnosis present

## 2022-08-15 DIAGNOSIS — J4541 Moderate persistent asthma with (acute) exacerbation: Secondary | ICD-10-CM | POA: Diagnosis not present

## 2022-08-15 DIAGNOSIS — R062 Wheezing: Secondary | ICD-10-CM

## 2022-08-15 DIAGNOSIS — B348 Other viral infections of unspecified site: Secondary | ICD-10-CM | POA: Diagnosis not present

## 2022-08-15 LAB — CREATININE, SERUM
Creatinine, Ser: 0.85 mg/dL (ref 0.44–1.00)
GFR, Estimated: >60 mL/min (ref >60)

## 2022-08-15 LAB — TROPONIN I (HIGH SENSITIVITY): Troponin I (High Sensitivity): 4 ng/L (ref <18)

## 2022-08-15 LAB — CULTURE, BLOOD (ROUTINE X 2)

## 2022-08-15 LAB — BASIC METABOLIC PANEL
Anion gap: 11 (ref 5–15)
BUN: 19 mg/dL (ref 8–23)
CO2: 24 mmol/L (ref 22–32)
Calcium: 8.9 mg/dL (ref 8.9–10.3)
Chloride: 103 mmol/L (ref 98–111)
Creatinine, Ser: 0.8 mg/dL (ref 0.44–1.00)
GFR, Estimated: >60 mL/min (ref >60)
Glucose, Bld: 189 mg/dL — ABNORMAL HIGH (ref 70–99)
Potassium: 3.6 mmol/L (ref 3.5–5.1)
Sodium: 138 mmol/L (ref 135–145)

## 2022-08-15 LAB — CBC
HCT: 32.3 % — ABNORMAL LOW (ref 36.0–46.0)
HCT: 31.4 % — ABNORMAL LOW (ref 36.0–46.0)
Hemoglobin: 10.9 g/dL — ABNORMAL LOW (ref 12.0–15.0)
Hemoglobin: 10.5 g/dL — ABNORMAL LOW (ref 12.0–15.0)
MCH: 35.6 pg — ABNORMAL HIGH (ref 26.0–34.0)
MCH: 35.6 pg — ABNORMAL HIGH (ref 26.0–34.0)
MCHC: 33.7 g/dL (ref 30.0–36.0)
MCHC: 33.4 g/dL (ref 30.0–36.0)
MCV: 105.6 fL — ABNORMAL HIGH (ref 80.0–100.0)
MCV: 106.4 fL — ABNORMAL HIGH (ref 80.0–100.0)
Platelets: 130 10*3/uL — ABNORMAL LOW (ref 150–400)
Platelets: 123 10*3/uL — ABNORMAL LOW (ref 150–400)
RBC: 3.06 MIL/uL — ABNORMAL LOW (ref 3.87–5.11)
RBC: 2.95 MIL/uL — ABNORMAL LOW (ref 3.87–5.11)
RDW: 13.2 % (ref 11.5–15.5)
RDW: 13.3 % (ref 11.5–15.5)
WBC: 8.3 10*3/uL (ref 4.0–10.5)
WBC: 9.1 10*3/uL (ref 4.0–10.5)
nRBC: 0 % (ref 0.0–0.2)
nRBC: 0 % (ref 0.0–0.2)

## 2022-08-15 LAB — HEPATIC FUNCTION PANEL
ALT: 35 U/L (ref 0–44)
AST: 29 U/L (ref 15–41)
Albumin: 3.7 g/dL (ref 3.5–5.0)
Alkaline Phosphatase: 69 U/L (ref 38–126)
Bilirubin, Direct: <0.1 mg/dL (ref 0.0–0.2)
Total Bilirubin: 0.5 mg/dL (ref 0.3–1.2)
Total Protein: 7.1 g/dL (ref 6.5–8.1)

## 2022-08-15 LAB — BLOOD GAS, VENOUS
Acid-Base Excess: 2.9 mmol/L — ABNORMAL HIGH (ref 0.0–2.0)
Bicarbonate: 27.9 mmol/L (ref 20.0–28.0)
O2 Saturation: 73.9 %
Patient temperature: 37
pCO2, Ven: 43 mmHg — ABNORMAL LOW (ref 44–60)
pH, Ven: 7.42 (ref 7.25–7.43)
pO2, Ven: 46 mmHg — ABNORMAL HIGH (ref 32–45)

## 2022-08-15 LAB — MRSA NEXT GEN BY PCR, NASAL: MRSA by PCR Next Gen: NOT DETECTED

## 2022-08-15 LAB — LEGIONELLA PNEUMOPHILA SEROGP 1 UR AG: L. pneumophila Serogp 1 Ur Ag: NEGATIVE

## 2022-08-15 MED ORDER — SODIUM CHLORIDE 0.9 % IV SOLN
3.0000 g | Freq: Three times a day (TID) | INTRAVENOUS | Status: DC
  Administered 2022-08-15 – 2022-08-17 (×6): 3 g via INTRAVENOUS
  Filled 2022-08-15 (×7): qty 8

## 2022-08-15 MED ORDER — FLUTICASONE FUROATE-VILANTEROL 100-25 MCG/ACT IN AEPB
1.0000 | INHALATION_SPRAY | Freq: Every day | RESPIRATORY_TRACT | Status: DC
  Administered 2022-08-16 – 2022-08-19 (×4): 1 via RESPIRATORY_TRACT
  Filled 2022-08-15: qty 28

## 2022-08-15 MED ORDER — SODIUM CHLORIDE 0.9 % IV SOLN
500.0000 mg | INTRAVENOUS | Status: AC
  Administered 2022-08-16 (×2): 500 mg via INTRAVENOUS
  Filled 2022-08-15 (×2): qty 5

## 2022-08-15 MED ORDER — UMECLIDINIUM BROMIDE 62.5 MCG/ACT IN AEPB
1.0000 | INHALATION_SPRAY | Freq: Every day | RESPIRATORY_TRACT | Status: DC
  Administered 2022-08-16 – 2022-08-19 (×4): 1 via RESPIRATORY_TRACT
  Filled 2022-08-15: qty 7

## 2022-08-15 MED ORDER — VITAMIN B-12 1000 MCG PO TABS
1000.0000 ug | ORAL_TABLET | Freq: Every day | ORAL | Status: DC
  Administered 2022-08-16 – 2022-08-19 (×4): 1000 ug via ORAL
  Filled 2022-08-15 (×4): qty 1

## 2022-08-15 MED ORDER — FAMOTIDINE 20 MG PO TABS
10.0000 mg | ORAL_TABLET | Freq: Every day | ORAL | Status: DC
  Administered 2022-08-15 – 2022-08-19 (×5): 10 mg via ORAL
  Filled 2022-08-15 (×5): qty 1

## 2022-08-15 MED ORDER — LEVOTHYROXINE SODIUM 50 MCG PO TABS
50.0000 ug | ORAL_TABLET | Freq: Every day | ORAL | Status: DC
  Administered 2022-08-16 – 2022-08-19 (×4): 50 ug via ORAL
  Filled 2022-08-15 (×4): qty 1

## 2022-08-15 MED ORDER — AMLODIPINE BESYLATE 5 MG PO TABS
5.0000 mg | ORAL_TABLET | Freq: Every day | ORAL | Status: DC
  Administered 2022-08-16 – 2022-08-19 (×4): 5 mg via ORAL
  Filled 2022-08-15 (×4): qty 1

## 2022-08-15 MED ORDER — PANTOPRAZOLE SODIUM 40 MG PO TBEC
40.0000 mg | DELAYED_RELEASE_TABLET | Freq: Every day | ORAL | Status: DC
  Administered 2022-08-16 – 2022-08-19 (×4): 40 mg via ORAL
  Filled 2022-08-15 (×4): qty 1

## 2022-08-15 MED ORDER — VANCOMYCIN HCL 1250 MG/250ML IV SOLN: 1250.0000 mg | INTRAVENOUS | Status: DC

## 2022-08-15 MED ORDER — ENOXAPARIN SODIUM 40 MG/0.4ML IJ SOSY
40.0000 mg | PREFILLED_SYRINGE | INTRAMUSCULAR | Status: DC
  Administered 2022-08-15 – 2022-08-18 (×4): 40 mg via SUBCUTANEOUS
  Filled 2022-08-15 (×4): qty 0.4

## 2022-08-15 MED ORDER — PREDNISONE 50 MG PO TABS
50.0000 mg | ORAL_TABLET | Freq: Every day | ORAL | Status: DC
  Administered 2022-08-16: 50 mg via ORAL
  Filled 2022-08-15: qty 1

## 2022-08-15 MED ORDER — VANCOMYCIN HCL 1750 MG/350ML IV SOLN
1750.0000 mg | Freq: Once | INTRAVENOUS | Status: AC
  Administered 2022-08-15: 1750 mg via INTRAVENOUS
  Filled 2022-08-15 (×2): qty 350

## 2022-08-15 MED ORDER — IPRATROPIUM-ALBUTEROL 0.5-2.5 (3) MG/3ML IN SOLN
6.0000 mL | Freq: Once | RESPIRATORY_TRACT | Status: AC
  Administered 2022-08-15: 6 mL via RESPIRATORY_TRACT
  Filled 2022-08-15: qty 6

## 2022-08-15 MED ORDER — ROSUVASTATIN CALCIUM 10 MG PO TABS
10.0000 mg | ORAL_TABLET | Freq: Every day | ORAL | Status: DC
  Administered 2022-08-15 – 2022-08-18 (×4): 10 mg via ORAL
  Filled 2022-08-15 (×5): qty 1

## 2022-08-15 MED ORDER — ALBUTEROL SULFATE (2.5 MG/3ML) 0.083% IN NEBU: 2.5000 mg | INHALATION_SOLUTION | RESPIRATORY_TRACT | Status: DC | PRN

## 2022-08-15 MED ORDER — FESOTERODINE FUMARATE ER 4 MG PO TB24
4.0000 mg | ORAL_TABLET | Freq: Every day | ORAL | Status: DC
  Administered 2022-08-16 – 2022-08-19 (×4): 4 mg via ORAL
  Filled 2022-08-15 (×4): qty 1

## 2022-08-15 MED ORDER — TRAZODONE HCL 50 MG PO TABS
25.0000 mg | ORAL_TABLET | Freq: Every evening | ORAL | Status: DC | PRN
  Administered 2022-08-16: 50 mg via ORAL
  Filled 2022-08-15: qty 1

## 2022-08-15 MED ORDER — GABAPENTIN 100 MG PO CAPS
100.0000 mg | ORAL_CAPSULE | Freq: Three times a day (TID) | ORAL | Status: DC
  Administered 2022-08-15 – 2022-08-18 (×10): 100 mg via ORAL
  Filled 2022-08-15 (×10): qty 1

## 2022-08-15 MED ORDER — SODIUM CHLORIDE 0.9% FLUSH
3.0000 mL | Freq: Two times a day (BID) | INTRAVENOUS | Status: DC
  Administered 2022-08-15 – 2022-08-19 (×7): 3 mL via INTRAVENOUS

## 2022-08-15 MED ORDER — ALBUTEROL SULFATE (2.5 MG/3ML) 0.083% IN NEBU
2.5000 mg | INHALATION_SOLUTION | Freq: Once | RESPIRATORY_TRACT | Status: AC
Start: 2022-08-15 — End: 2022-08-15
  Administered 2022-08-15: 2.5 mg via RESPIRATORY_TRACT

## 2022-08-15 MED ORDER — CITALOPRAM HYDROBROMIDE 10 MG PO TABS
20.0000 mg | ORAL_TABLET | Freq: Every day | ORAL | Status: DC
  Administered 2022-08-16 – 2022-08-19 (×4): 20 mg via ORAL
  Filled 2022-08-15 (×4): qty 2

## 2022-08-15 MED ORDER — METHYLPREDNISOLONE ACETATE 40 MG/ML IJ SUSP
40.0000 mg | Freq: Once | INTRAMUSCULAR | Status: AC
Start: 2022-08-15 — End: 2022-08-15
  Administered 2022-08-15: 40 mg via INTRAMUSCULAR

## 2022-08-15 NOTE — Progress Notes (Signed)
Subjective:  Patient ID: Sarah [name], female    DOB: 1950/12/20  Age: 72 y.o. MRN: [id number]  CC: The primary encounter diagnosis was Moderate persistent asthma with exacerbation. A diagnosis of Wheezing was also pertinent to this visit.   HPI Sarah [name] presents for hospital follow up .  She is accompanied by her friend,  Microbiologist Complaint  Patient presents with   Hospitalization Follow-up    ED follow up on pneumonia    Cliffie is a 72 yr old female with MDS, thrombocytopenia and asthma,  who developed cough and shortness of breath  one week ago after  attending an outdoor birthday party on Saturday April 13,  no known  sick contacts.  On Thursday she was admitted to [facility] after presenting with hypoxic respiratory failure secondary to bilateral community acquired  pneumonia complicated by asthma exacerbation.  Required use of supplemental oxygen on 18th and 19th,  was weaned off of oxygen on 20th and sent home same day  on dual antibiotics and  50 mg prednisone  x 3 days .   She states that during the last day of her admission her sats would drop < 90% if she  she talked while walking ,  so she stopped talking during the testing   Since discharge she has been fatigued, short of breath , wheezing, struggling to breath .  Has not slept due to persistent cough and increased work of breathing .  Has been unable to use the nebulizer because the new medication made her too nervous  (ipatroprium/albuterol0.  Taking the augmentin and prednisone and azithromycin as directed   Outpatient Medications Prior to Visit  Medication Sig Dispense Refill   albuterol (VENTOLIN HFA) 108 (90 Base) MCG/ACT inhaler Inhale 1-2 puffs into the lungs every 4 (four) hours as needed for wheezing or shortness of breath. 8 g 0   amLODipine (NORVASC) 5 MG tablet TAKE 1 TABLET BY MOUTH DAILY 100 tablet 2   amoxicillin-clavulanate (AUGMENTIN) 875-125 MG tablet Take 1 tablet by mouth 2 (two) times daily for 4  days. 8 tablet 0   azithromycin (ZITHROMAX) 250 MG tablet Take 1 tablet (250 mg total) by mouth daily for 3 days. 3 tablet 0   Calcium Citrate-Vitamin D (CALCIUM CITRATE + D PO) Take 1 tablet by mouth 2 (two) times daily.     citalopram (CELEXA) 20 MG tablet TAKE 1 TABLET BY MOUTH DAILY 90 tablet 3   dextromethorphan-guaiFENesin (MUCINEX DM) 30-600 MG 12hr tablet Take 1 tablet by mouth 2 (two) times daily as needed for cough. 30 tablet 0   famotidine (PEPCID) 20 MG tablet TAKE 1 TABLET BY MOUTH DAILY. BEFORE DINNER 30 tablet 11   ferrous sulfate 324 MG TBEC Take 324 mg by mouth. 65 mg of elemental iron     fexofenadine (ALLEGRA) 180 MG tablet Take 180 mg by mouth daily.     Fluticasone-Umeclidin-Vilant (TRELEGY ELLIPTA) 100-62.5-25 MCG/ACT AEPB Inhale 1 puff into the lungs daily. 1 each 11   gabapentin (NEURONTIN) 100 MG capsule Take 1 capsule (100 mg total) by mouth 3 (three) times daily. (Patient taking differently: Take 300 mg by mouth at bedtime.) 300 capsule 2   ipratropium-albuterol (DUONEB) 0.5-2.5 (3) MG/3ML SOLN Take 3 mLs by nebulization every 4 (four) hours as needed (wheezing or shortness of breath that does not respond to albuterol inhaler). 360 mL 0   levothyroxine (SYNTHROID) 50 MCG tablet TAKE 1 TABLET BY MOUTH DAILY ON  EMPTY STOMACH  WITH A GLASS OF  WATER AT LEAST 30 TO 60 MIN  BEFORE BREAKFAST 100 tablet 2   pantoprazole (PROTONIX) 40 MG tablet TAKE 1 TABLET BY MOUTH DAILY 100 tablet 2   predniSONE (DELTASONE) 50 MG tablet Take 1 tablet (50 mg total) by mouth daily with breakfast. Take 1 tablet daily for the next 3 days starting on 08/12/2022 3 tablet 0   rosuvastatin (CRESTOR) 10 MG tablet Take 1 tablet (10 mg total) by mouth daily. 100 tablet 2   solifenacin (VESICARE) 5 MG tablet TAKE ONE TABLET BY MOUTH EVERY DAY 30 tablet 2   traZODone (DESYREL) 50 MG tablet TAKE 1/2 TO 1 TABLET BY MOUTH AT BEDTIME AS NEEDED FOR SLEEP 90 tablet 3   vitamin B-12 (CYANOCOBALAMIN) 1000 MCG tablet  Take 1,000 mcg by mouth daily.     No facility-administered medications prior to visit.    Review of Systems;  Patient denies headache, fevers, malaise, unintentional weight loss, skin rash, eye pain, sinus congestion and sinus pain, sore throat, dysphagia,  hemoptysis,  chest pain, palpitations, orthopnea, edema, abdominal pain, nausea, melena, diarrhea, constipation, flank pain, dysuria, hematuria, urinary  Frequency, nocturia, numbness, tingling, seizures,  Focal weakness, Loss of consciousness,  depression, anxiety, and suicidal ideation.      Objective:  BP 128/64   Pulse 90   Temp 97.9 F (36.6 C) (Oral)   Ht  (1.6 m)   Wt 172 lb 9.6 oz (78.3 kg)   SpO2 96%   BMI 30.57 kg/m   BP Readings from Last 3 Encounters:  08/15/22 128/64  08/13/22 (!) 122/59  07/26/22 (!) 116/59    Wt Readings from Last 3 Encounters:  08/15/22 172 lb 9.6 oz (78.3 kg)  08/11/22 174 lb (78.9 kg)  07/26/22 175 lb (79.4 kg)    Physical Exam Vitals reviewed.  Constitutional:      General: She is in acute distress.     Appearance: Normal appearance. She is normal weight. She is ill-appearing. She is not toxic-appearing or diaphoretic.  HENT:     Head: Normocephalic.  Eyes:     General: No scleral icterus.       Right eye: No discharge.        Left eye: No discharge.     Conjunctiva/sclera: Conjunctivae normal.  Cardiovascular:     Rate and Rhythm: Normal rate and regular rhythm.  Pulmonary:     Effort: Respiratory distress present.     Breath sounds: Decreased air movement present. Examination of the right-upper field reveals decreased breath sounds and wheezing. Examination of the left-upper field reveals decreased breath sounds and wheezing. Examination of the right-middle field reveals decreased breath sounds and wheezing. Examination of the left-middle field reveals decreased breath sounds and wheezing. Examination of the right-lower field reveals decreased breath sounds and wheezing.  Examination of the left-lower field reveals decreased breath sounds and wheezing. Decreased breath sounds and wheezing present.     Comments: Using accessory muscles Not speaking in complete sentences RR 30/min Prolonged expiratory phase  Abdominal:     General: Abdomen is flat.  Musculoskeletal:        General: Normal range of motion.  Skin:    General: Skin is warm and dry.  Neurological:     General: No focal deficit present.     Mental Status: She is alert and oriented to person, place, and time. Mental status is at baseline.  Psychiatric:        Mood and Affect: Mood  normal.        Behavior: Behavior normal.        Thought Content: Thought content normal.        Judgment: Judgment normal.    Lab Results  Component Value Date   HGBA1C 6.1 09/15/2021   HGBA1C 6.0 06/07/2021   HGBA1C 6.4 12/10/2020    Lab Results  Component Value Date   CREATININE 0.79 08/12/2022   CREATININE 0.84 08/11/2022   CREATININE 0.77 07/26/2022    Lab Results  Component Value Date   WBC 4.6 08/12/2022   HGB 9.9 (L) 08/12/2022   HCT 29.6 (L) 08/12/2022   PLT 91 (L) 08/12/2022   GLUCOSE 205 (H) 08/12/2022   CHOL 147 09/15/2021   TRIG 59.0 09/15/2021   HDL 64.20 09/15/2021   LDLCALC 71 09/15/2021   ALT 83 (H) 08/11/2022   AST 80 (H) 08/11/2022   NA 138 08/12/2022   K 3.9 08/12/2022   CL 105 08/12/2022   CREATININE 0.79 08/12/2022   BUN 15 08/12/2022   CO2 25 08/12/2022   TSH 1.47 09/15/2021   HGBA1C 6.1 09/15/2021   MICROALBUR <0.7 06/07/2021    DG Chest 2 View  Result Date: 08/11/2022 CLINICAL DATA:  SOB, Wheezing EXAM: CHEST - 2 VIEW COMPARISON:  CXR 04/07/22 FINDINGS: No pleural effusion. No pneumothorax. Normal cardiac and mediastinal contours. New patchy airspace opacities in the right mid and lower lung fields are worrisome for infection. No radiographically apparent displaced rib fractures. Cervical spinal hardware in place. Visualized upper abdomen is unremarkable.  IMPRESSION: New patchy airspace opacities in right mid and lower lung fields are worrisome for infection. Electronically Signed   By: Lorenza Cambridge M.D.   On: 08/11/2022 13:30    Assessment & Plan:  .Moderate persistent asthma with exacerbation Assessment & Plan: She is no longer hypoxic but she is tachypneic, using accessory muscles, and moving very little air.  40 mg DepoMedrol was given IM today and she was treated with an albuterol nebulizer with no significant change in her clinical condition .  I am sending her back to the ER for unresolved asthma exacerbation with high likelihood for readmission   Orders: -     Albuterol Sulfate  Wheezing -     methylPREDNISolone Acetate     I provided 30 minutes of face-to-face time during this encounter reviewing patient's last visit with me, patient's hospitalization , including progress notes,  imaging studies,   labs   and post visit ordering to diagnostics and therapeutics .   Follow-up: No follow-ups on file.   Sherlene Shams, MD

## 2022-08-15 NOTE — ED Notes (Signed)
Pt up to toilet with assistance patient has steady gait.

## 2022-08-15 NOTE — Assessment & Plan Note (Signed)
Also be related to steroid use, check hemoglobin A1c.

## 2022-08-15 NOTE — Assessment & Plan Note (Signed)
Patient treated with steroids at primary care office, recently received inhaled bronchodilators as well as supplementary oxygen in ER, at this time during my exam it seems that the patient's eczema exacerbation is much improved.  At this time therefore I will continue patient's oral prednisone that she was prescribed at the time of discharge from the hospital 2 days ago.  We will check a VBG to rule out occult hypercapnia.

## 2022-08-15 NOTE — ED Triage Notes (Signed)
Patient to ED via POV for SOB. Sent by PCP for increased SOB and audible wheezing. Patient states she has been this way since the last time she was here. Was given breathing treatment and shot of steroids at PCP. Dx with pneumonia on last visit.

## 2022-08-15 NOTE — Assessment & Plan Note (Signed)
She is no longer hypoxic but she is tachypneic, using accessory muscles, and moving very little air.  40 mg DepoMedrol was given IM today and she was treated with an albuterol nebulizer with no significant change in her clinical condition .  I am sending her back to the ER for unresolved asthma exacerbation with high likelihood for readmission

## 2022-08-15 NOTE — ED Provider Notes (Signed)
[facility] Provider Note   Event Date/Time   First MD Initiated Contact with Patient 08/15/22 1635     (approximate) History  Shortness of Breath  HPI Sarah [name] is a 72 y.o. female with a stated past medical history of asthma who presents complaining of worsening shortness of breath since discharge from the hospital with a diagnosis of pneumonia.  Patient states that she was discharged 2 days prior to arrival after being hospitalized for worsening shortness of breath in the setting of asthma exacerbation and pneumonia.  Patient states that she was discharged on antibiotics of which she has finished all but 1 dose however the breathing treatments that she was discharged on have been "on tolerable".  Patient states that she has been unable to use them at home.  Patient was seen at her primary care doctor, Dr. Melina Schools, office today and found to have significant wheezing and shortness of breath for which she was sent to the emergency department for further evaluation. ROS: Patient currently denies any vision changes, tinnitus, difficulty speaking, facial droop, sore throat, chest pain, abdominal pain, nausea/vomiting/diarrhea, dysuria, or weakness/numbness/paresthesias in any extremity   Physical Exam  Triage Vital Signs: ED Triage Vitals  Enc Vitals Group     BP 08/15/22 1546 (!) 134/57     Pulse Rate 08/15/22 1546 88     Resp 08/15/22 1546 20     Temp 08/15/22 1546 98.1 F (36.7 C)     Temp Source 08/15/22 1546 Oral     SpO2 08/15/22 1546 96 %     Weight --      Height --      Head Circumference --      Peak Flow --      Pain Score 08/15/22 1543 0     Pain Loc --      Pain Edu? --      Excl. in GC? --    Most recent vital signs: Vitals:   08/15/22 1905 08/15/22 1930  BP: (!) 119/57 103/68  Pulse: 87 92  Resp: 20 18  Temp: 97.7 F (36.5 C)   SpO2: 96% 96%   General: Awake, oriented x4. CV:  Good peripheral perfusion.  Resp:  Increased  respiratory effort.  Inspiratory and expiratory wheezing over bilateral lung fields Abd:  No distention.  Other:  Elderly overweight Caucasian female laying in bed in mild respiratory distress ED Results / Procedures / Treatments  Labs (all labs ordered are listed, but only abnormal results are displayed) Labs Reviewed  BASIC METABOLIC PANEL - Abnormal; Notable for the following components:      Result Value   Glucose, Bld 189 (*)    All other components within normal limits  CBC - Abnormal; Notable for the following components:   RBC 3.06 (*)    Hemoglobin 10.9 (*)    HCT 32.3 (*)    MCV 105.6 (*)    MCH 35.6 (*)    Platelets 130 (*)    All other components within normal limits   EKG ED ECG REPORT I, Merwyn Katos, the attending physician, personally viewed and interpreted this ECG. Date: 08/15/2022 EKG Time: 1546 Rate: 88 Rhythm: normal sinus rhythm QRS Axis: normal Intervals: normal ST/T Wave abnormalities: normal Narrative Interpretation: no evidence of acute ischemia RADIOLOGY ED MD interpretation: 2 view chest x-ray interpreted by me shows no evidence of acute abnormalities including no pneumonia, pneumothorax, or widened mediastinum -Agree with radiology assessment Official radiology report(s): DG  Chest 2 View  Result Date: 08/15/2022 CLINICAL DATA:  Shortness of breath. EXAM: CHEST - 2 VIEW COMPARISON:  08/11/2022. FINDINGS: Improved aeration of both lungs. No consolidation or pulmonary edema. Stable cardiac and mediastinal contours. No pleural effusion or pneumothorax. IMPRESSION: No evidence of acute cardiopulmonary disease. Electronically Signed   By: Orvan Falconer M.D.   On: 08/15/2022 16:25   PROCEDURES: Critical Care performed: Yes, see critical care procedure note(s) .1-3 Lead EKG Interpretation  Performed by: Merwyn Katos, MD Authorized by: Merwyn Katos, MD     Interpretation: normal     ECG rate:  71   ECG rate assessment: normal     Rhythm:  sinus rhythm     Ectopy: none     Conduction: normal    MEDICATIONS ORDERED IN ED: Medications  albuterol (PROVENTIL) (2.5 MG/3ML) 0.083% nebulizer solution 2.5 mg (has no administration in time range)  ipratropium-albuterol (DUONEB) 0.5-2.5 (3) MG/3ML nebulizer solution 6 mL (6 mLs Nebulization Given 08/15/22 1741)   IMPRESSION / MDM / ASSESSMENT AND PLAN / ED COURSE  I reviewed the triage vital signs and the nursing notes.                             The patient is on the cardiac monitor to evaluate for evidence of arrhythmia and/or significant heart rate changes. Patient's presentation is most consistent with acute presentation with potential threat to life or bodily function. The patient appears to be suffering from a moderate/severe exacerbation of COPD/asthma.  Based on the history, exam, CXR/EKG reviewed by me, and further workup I dont suspect any other emergent cause of this presentation, such as pneumonia, acute coronary syndrome, congestive heart failure, pulmonary embolism, or pneumothorax. Prior to arrival patient received intramuscular corticosteroids ED Interventions: bronchodilators, antibiotics, reassess  Reassessment: After treatment, the patients shortness of breath is improving but patient is still requiring supplemental oxygenation   Disposition: Admit   FINAL CLINICAL IMPRESSION(S) / ED DIAGNOSES   Final diagnoses:  COPD exacerbation  Acute respiratory distress   Rx / DC Orders   ED Discharge Orders     None      Note:  This document was prepared using Dragon voice recognition software and may include unintentional dictation errors.   Merwyn Katos, MD 08/15/22 2002

## 2022-08-15 NOTE — H&P (Signed)
History and Physical    Patient: Sarah [name] ZHY:[id number] DOB: [birth year] DOA: 08/15/2022 DOS: the patient was seen and examined on 08/15/2022 PCP: Sherlene Shams, MD  Patient coming from: Home  Chief Complaint:  Chief Complaint  Patient presents with   Shortness of Breath   HPI: Sarah [name] is a 72 y.o. female with medical history significant of asthma for which she reports 2 prior remote hospitalizations.  Patient was hospitalized most recently at [facility] on April 18, 6 days ago, with a diagnosis of pneumonia.  Patient was actually hypoxic on presentation and treated with ceftriaxone and azithromycin with resolution of hypoxia.  Patient was discharged on April 20 home.  Friend at the bedside reports, and patient corroborates, that even on the day of discharge patient was having marked coughing and shortness of breath while on the way home via car.  Patient has continued to have shortness of breath at home and reports limitation of activities of daily living because of that.  Patient continues to report having cough with thick brown sputum production that she reports from the time of her last admission.  There is no report of leg swelling cramping chest pain fever presyncope or fall or loss of consciousness.  Patient was evaluated by primary care provider today and sent back to the ER because of reported respiratory distress.  Per report from the ER physician, patient was persistently tachypneic on evaluation in the ER, patient has received steroids at doctor's office as well as inhaled bronchodilators here in the ER.  There is no documentation of hypoxia.  However patient was given supplemental oxygen for comfort.  At this time patient reports improved sensation of shortness of breath since she got oxygen.  Medical evaluation is sought  Review of Systems: As mentioned in the history of present illness. All other systems reviewed and are negative. Past Medical History:  Diagnosis  Date   Arthritis    Asthma    Depression    Dyspnea    with heat   Environmental and seasonal allergies    GERD (gastroesophageal reflux disease)    Hypertension    Hypothyroidism    Presence of dental prosthetic device    Dental implants   Wheezing    Past Surgical History:  Procedure Laterality Date   ABDOMINAL HYSTERECTOMY  04/26/1987   ANTERIOR CERVICAL DECOMPRESSION/DISCECTOMY FUSION 4 LEVELS N/A 11/10/2021   Procedure: C3-7 ANTERIOR CERVICAL DISCECTOMY AND FUSION (GLOBUS FORGE);  Surgeon: Venetia Night, MD;  Location: [facility] ORS;  Service: Neurosurgery;  Laterality: N/A;   BROW LIFT Bilateral 02/07/2020   Procedure: BLEPHAROPLASTY UPPER EYELID; W/EXCESS SKIN BROW PTOSIS REPAIR BILATERAL;  Surgeon: Imagene Riches, MD;  Location: [facility];  Service: Ophthalmology;  Laterality: Bilateral;   CATARACT EXTRACTION W/PHACO Right 09/13/2016   Procedure: CATARACT EXTRACTION PHACO AND INTRAOCULAR LENS PLACEMENT (IOC);  Surgeon: Galen Manila, MD;  Location: [facility] ORS;  Service: Ophthalmology;  Laterality: Right;  [place] 00:38 AP% 14.3 CDE 5.51 Fluid pack lot # [id number]   CATARACT EXTRACTION W/PHACO Left 10/11/2016   Procedure: CATARACT EXTRACTION PHACO AND INTRAOCULAR LENS PLACEMENT (IOC);  Surgeon: Galen Manila, MD;  Location: [facility] ORS;  Service: Ophthalmology;  Laterality: Left;  [place] 00:30 AP% 11.3 CDE 3.50 fluid pack lot # [id number]   COLONOSCOPY WITH PROPOFOL N/A 09/30/2014   Procedure: COLONOSCOPY WITH PROPOFOL;  Surgeon: Earline Mayotte, MD;  Location: [facility] ENDOSCOPY;  Service: Endoscopy;  Laterality: N/A;   COLONOSCOPY WITH PROPOFOL N/A 03/24/2021  Procedure: COLONOSCOPY WITH PROPOFOL;  Surgeon: Toney Reil, MD;  Location: [facility] ENDOSCOPY;  Service: Gastroenterology;  Laterality: N/A;   ESOPHAGOGASTRODUODENOSCOPY N/A 09/30/2014   Procedure: ESOPHAGOGASTRODUODENOSCOPY (EGD);  Surgeon: Earline Mayotte, MD;  Location: [facility];  Service: Endoscopy;   Laterality: N/A;   ESOPHAGOGASTRODUODENOSCOPY N/A 03/24/2021   Procedure: ESOPHAGOGASTRODUODENOSCOPY (EGD);  Surgeon: Toney Reil, MD;  Location: [facility] ENDOSCOPY;  Service: Gastroenterology;  Laterality: N/A;   FOOT SURGERY Bilateral 04/26/1983   KNEE SURGERY  04/25/2014   Social History:  reports that she has never smoked. She has never used smokeless tobacco. She reports that she does not drink alcohol and does not use drugs.  No Known Allergies  Family History  Problem Relation Age of Onset   Diabetes Mother    Hyperlipidemia Mother    Heart disease Mother    Prostate cancer Neg Hx    Kidney cancer Neg Hx    Bladder Cancer Neg Hx     Prior to Admission medications   Medication Sig Start Date End Date Taking? Authorizing Provider  albuterol (VENTOLIN HFA) 108 (90 Base) MCG/ACT inhaler Inhale 1-2 puffs into the lungs every 4 (four) hours as needed for wheezing or shortness of breath. 08/13/22  Yes Sunnie Nielsen, DO  amLODipine (NORVASC) 5 MG tablet TAKE 1 TABLET BY MOUTH DAILY 03/14/22  Yes Sherlene Shams, MD  amoxicillin-clavulanate (AUGMENTIN) 875-125 MG tablet Take 1 tablet by mouth 2 (two) times daily for 4 days. 08/13/22 08/17/22 Yes Alexander, Dorene Grebe, DO  azithromycin (ZITHROMAX) 250 MG tablet Take 1 tablet (250 mg total) by mouth daily for 3 days. 08/13/22 08/16/22 Yes Sunnie Nielsen, DO  Calcium Citrate-Vitamin D (CALCIUM CITRATE + D PO) Take 1 tablet by mouth 2 (two) times daily.   Yes [provider]  citalopram (CELEXA) 20 MG tablet TAKE 1 TABLET BY MOUTH DAILY 03/04/22  Yes Sherlene Shams, MD  dextromethorphan-guaiFENesin [facility] DM) 30-600 MG 12hr tablet Take 1 tablet by mouth 2 (two) times daily as needed for cough. 08/13/22  Yes Sunnie Nielsen, DO  famotidine (PEPCID) 20 MG tablet TAKE 1 TABLET BY MOUTH DAILY. BEFORE DINNER 07/28/17  Yes Sherlene Shams, MD  ferrous sulfate 324 MG TBEC Take 324 mg by mouth. 65 mg of elemental iron   Yes [provider]  fexofenadine (ALLEGRA) 180 MG tablet Take 180 mg by mouth daily.   Yes [provider]  Fluticasone-Umeclidin-Vilant (TRELEGY ELLIPTA) 100-62.5-25 MCG/ACT AEPB Inhale 1 puff into the lungs daily. 06/23/22  Yes Sherlene Shams, MD  gabapentin (NEURONTIN) 100 MG capsule Take 1 capsule (100 mg total) by mouth 3 (three) times daily. Patient taking differently: Take 300 mg by mouth at bedtime. 05/05/22  Yes Sherlene Shams, MD  ipratropium-albuterol (DUONEB) 0.5-2.5 (3) MG/3ML SOLN Take 3 mLs by nebulization every 4 (four) hours as needed (wheezing or shortness of breath that does not respond to albuterol inhaler). 08/13/22  Yes Sunnie Nielsen, DO  levothyroxine (SYNTHROID) 50 MCG tablet TAKE 1 TABLET BY MOUTH DAILY ON  EMPTY STOMACH WITH A GLASS OF  WATER AT LEAST 30 TO 60 MIN  BEFORE BREAKFAST 03/14/22  Yes Sherlene Shams, MD  pantoprazole (PROTONIX) 40 MG tablet TAKE 1 TABLET BY MOUTH DAILY 03/14/22  Yes Sherlene Shams, MD  predniSONE (DELTASONE) 50 MG tablet Take 1 tablet (50 mg total) by mouth daily with breakfast. Take 1 tablet daily for the next 3 days starting on 08/12/2022 08/13/22  Yes Sunnie Nielsen, DO  rosuvastatin (CRESTOR) 10 MG tablet Take 1 tablet (10 mg total) by mouth daily. Patient taking differently: Take 10 mg by mouth at bedtime. 05/05/22  Yes Sherlene Shams, MD  solifenacin (VESICARE) 5 MG tablet TAKE ONE TABLET BY MOUTH EVERY DAY 05/05/22  Yes Sherlene Shams, MD  traZODone (DESYREL) 50 MG tablet TAKE 1/2 TO 1 TABLET BY MOUTH AT BEDTIME AS NEEDED FOR SLEEP 10/18/21  Yes Sherlene Shams, MD  vitamin B-12 (CYANOCOBALAMIN) 1000 MCG tablet Take 1,000 mcg by mouth daily.   Yes [provider]    Physical Exam: Vitals:   08/15/22 1546 08/15/22 1859 08/15/22 1905 08/15/22 1930  BP: (!) 134/57  (!) 119/57 103/68  Pulse: 88  87 92  Resp: Temp: 98.1 F (36.7 C)  97.7 F (36.5 C)   TempSrc: Oral  Oral   SpO2: 96% 95% 96% 96%    General: Patient does not appear to be in any immediate distress at this time, gives a coherent account of history herself, friend at the bedside as well Respiratory exam: Bilateral basilar crackles are heard, however there are scant crackles in all lung fields, patient has occasional expiratory wheeze that is more upper airway.  However there is no persistent diffuse wheezes that is convincingly heard Cardiovascular exam S1-S2 normal Abdomen soft nontender Extremities warm without edema.  Data Reviewed:  Labs on Admission:  Results for orders placed or performed during the hospital encounter of 08/15/22 (from the past 24 hour(s))  Basic metabolic panel     Status: Abnormal   Collection Time: 08/15/22  3:46 PM  Result Value Ref Range   Sodium 138 135 - 145 mmol/L   Potassium 3.6 3.5 - 5.1 mmol/L   Chloride 103 98 - 111 mmol/L   CO2 24 22 - 32 mmol/L   Glucose, Bld 189 (H) 70 - 99 mg/dL   BUN 19 8 - 23 mg/dL   Creatinine, Ser 1.61 0.44 - 1.00 mg/dL   Calcium 8.9 8.9 - 09.6 mg/dL   GFR, Estimated >04 >54 mL/min   Anion gap 11 5 - 15  CBC     Status: Abnormal   Collection Time: 08/15/22  3:46 PM  Result Value Ref Range   WBC 8.3 4.0 - 10.5 K/uL   RBC 3.06 (L) 3.87 - 5.11 MIL/uL   Hemoglobin 10.9 (L) 12.0 - 15.0 g/dL   HCT 09.8 (L) 11.9 - 14.7 %   MCV 105.6 (H) 80.0 - 100.0 fL   MCH 35.6 (H) 26.0 - 34.0 pg   MCHC 33.7 30.0 - 36.0 g/dL   RDW 82.9 56.2 - 13.0 %   Platelets 130 (L) 150 - 400 K/uL   nRBC 0.0 0.0 - 0.2 %     Radiological Exams on Admission:  DG Chest 2 View  Result Date: 08/15/2022 CLINICAL DATA:  Shortness of breath. EXAM: CHEST - 2 VIEW COMPARISON:  08/11/2022. FINDINGS: Improved aeration of both lungs. No consolidation or pulmonary edema. Stable cardiac and mediastinal contours. No pleural effusion or pneumothorax. IMPRESSION: No evidence of acute cardiopulmonary disease. Electronically Signed   By: Orvan Falconer M.D.   On: 08/15/2022 16:25    EKG:  Independently reviewed. NSR   Assessment and Plan: * CAP (community acquired pneumonia) This was diagnosed on August 09, 2022 when, patient at the time had presented to [facility] with hypoxia and cough and shortness of breath.  Review of documentation includes that the patient did have a sputum culture  at that time which was not positive for staph.  Patient was treated with ceftriaxone and azithromycin and discharged 2 days ago to continue outpatient course of Augmentin and azithromycin.  Unfortunately patient reports continued cough and shortness of breath and is found (today, 08/15/2022) to have tachypnea on evaluation as well as bilateral basilar crackles otherwise some diffuse crackles as well.  At this time concern for development of a new infection, therefore we will recheck sputum for Gram stain and cultures, check viral respiratory panel, check CT scan.  At this time we will broaden antibiotic coverage to include vancomycin plus Unasyn plus azithromycin. Other considerations include interstitial lung diseases. Will monitor patient clinically in terms of her respiratory exam as well as respiratory rate.  Asthma exacerbation Patient treated with steroids at primary care office, recently received inhaled bronchodilators as well as supplementary oxygen in ER, at this time during my exam it seems that the patient's eczema exacerbation is much improved.  At this time therefore I will continue patient's oral prednisone that she was prescribed at the time of discharge from the hospital 2 days ago.  We will check a VBG to rule out occult hypercapnia.  Hyperglycemia Also be related to steroid use, check hemoglobin A1c.       Advance Care Planning:   Code Status: Full Code   Consults: n/a  Family Communication: per patient. Friend at bedside.  Severity of Illness: The appropriate patient status for this patient is INPATIENT. Inpatient status is judged to be reasonable and necessary in order to provide  the required intensity of service to ensure the patient's safety. The patient's presenting symptoms, physical exam findings, and initial radiographic and laboratory data in the context of their chronic comorbidities is felt to place them at high risk for further clinical deterioration. Furthermore, it is not anticipated that the patient will be medically stable for discharge from the hospital within 2 midnights of admission.   * I certify that at the point of admission it is my clinical judgment that the patient will require inpatient hospital care spanning beyond 2 midnights from the point of admission due to high intensity of service, high risk for further deterioration and high frequency of surveillance required.*  Author: Nolberto Hanlon, MD 08/15/2022 8:39 PM  For on call review [email or web address].

## 2022-08-15 NOTE — Assessment & Plan Note (Addendum)
This was diagnosed on August 09, 2022 when, patient at the time had presented to [facility] with hypoxia and cough and shortness of breath.  Review of documentation includes that the patient did have a sputum culture at that time which was not positive for staph.  Patient was treated with ceftriaxone and azithromycin and discharged 2 days ago to continue outpatient course of Augmentin and azithromycin.  Unfortunately patient reports continued cough and shortness of breath and is found (today, 08/15/2022) to have tachypnea on evaluation as well as bilateral basilar crackles otherwise some diffuse crackles as well.  At this time concern for development of a new infection, therefore we will recheck sputum for Gram stain and cultures, check viral respiratory panel, check CT scan.  At this time we will broaden antibiotic coverage to include vancomycin plus Unasyn plus azithromycin. Other considerations include interstitial lung diseases. Will monitor patient clinically in terms of her respiratory exam as well as respiratory rate.

## 2022-08-15 NOTE — Transitions of Care (Post Inpatient/ED Visit) (Signed)
   08/15/2022  Name: Sarah [name] MRN: [id number] DOB: 08-Feb-1951  Today's TOC FU Call Status: Today's TOC FU Call Status:: Successful TOC FU Call Competed TOC FU Call Complete Date: 08/15/22  Transition Care Management Follow-up Telephone Call Date of Discharge: 08/13/22 Discharge Facility: [facility]) Type of Discharge: Inpatient Admission Primary Inpatient Discharge Diagnosis:: community acquired pneumonia How have you been since you were released from the hospital?: Same (Per patient the nebuluzer medication they gave her makes her real jumpy. She is having some wheezing. Per patient she doesn't feel good.) Any questions or concerns?: No  Items Reviewed: Did you receive and understand the discharge instructions provided?: Yes Medications obtained and verified?: Yes (Medications Reviewed) Any new allergies since your discharge?: No Dietary orders reviewed?: No Do you have support at home?: Yes People in Home: alone Name of Support/Comfort Primary Source: [name]  Home Care and Equipment/Supplies: Were Home Health Services Ordered?: No Any new equipment or medical supplies ordered?: No  Functional Questionnaire: Do you need assistance with bathing/showering or dressing?: No Do you need assistance with meal preparation?: No Do you need assistance with eating?: No Do you have difficulty maintaining continence: No Do you need assistance with getting out of bed/getting out of a chair/moving?: No  Follow up appointments reviewed: PCP Follow-up appointment confirmed?: Yes Date of PCP follow-up appointment?: 08/15/22 Follow-up Provider: Dr Darrick Huntsman [facility] Follow-up appointment confirmed?: Yes Date of Specialist follow-up appointment?: 08/23/22 Follow-Up Specialty Provider:: [place] Do you need transportation to your follow-up appointment?: No Do you understand care options if your condition(s) worsen?: Yes-patient  verbalized understanding  SDOH Interventions Today    Flowsheet Row Most Recent Value  SDOH Interventions   Food Insecurity Interventions Intervention Not Indicated  Housing Interventions Intervention Not Indicated  Transportation Interventions Intervention Not Indicated      Interventions Today    Flowsheet Row Most Recent Value  General Interventions   General Interventions Discussed/Reviewed General Interventions Reviewed, General Interventions Discussed, Doctor Visits  Doctor Visits Discussed/Reviewed Doctor Visits Discussed, Doctor Visits Reviewed        Gean Maidens BSN RN [facility] [phone]

## 2022-08-15 NOTE — Consult Note (Addendum)
Pharmacy Antibiotic Note  Sarah [name] is a 72 y.o. female admitted on 08/15/2022 with pneumonia.  Pharmacy has been consulted for vancomycin and unasyn dosing.  Plan: Unasyn 3g IV every 6 hours Vancomycin  x1 followed by vancomycin  IV every 24 hours Goal AUC 400-550  Est AUC: 467.8 Est Cmax: 32.6 Est Cmin: 11.2 Calculated with SCr 0.8 mg/dL  Continue to monitor and dose adjust antibiotics according to renal function and indication      Temp (24hrs), Avg:97.9 F (36.6 C), Min:97.7 F (36.5 C), Max:98.1 F (36.7 C)  Recent Labs  Lab 08/11/22 1233 08/12/22 0502 08/15/22 1546  WBC 5.5 4.6 8.3  CREATININE 0.84 0.79 0.80    Estimated Creatinine Clearance: 62.1 mL/min (by C-G formula based on SCr of 0.8 mg/dL).    No Known Allergies  Antimicrobials this admission: 4/22 Unasyn >>    Microbiology results: 4/22 BCx: sent 4/22 Sputum: sent  4/22 Resp Pathogen panel: sent  Thank you for allowing pharmacy to be a part of this patient's care.  Selinda Eon 08/15/2022 8:44 PM

## 2022-08-16 DIAGNOSIS — F419 Anxiety disorder, unspecified: Secondary | ICD-10-CM

## 2022-08-16 DIAGNOSIS — J45901 Unspecified asthma with (acute) exacerbation: Secondary | ICD-10-CM | POA: Diagnosis not present

## 2022-08-16 DIAGNOSIS — J189 Pneumonia, unspecified organism: Secondary | ICD-10-CM

## 2022-08-16 LAB — COMPREHENSIVE METABOLIC PANEL
ALT: 33 U/L (ref 0–44)
AST: 22 U/L (ref 15–41)
Albumin: 3.2 g/dL — ABNORMAL LOW (ref 3.5–5.0)
Alkaline Phosphatase: 59 U/L (ref 38–126)
Anion gap: 7 (ref 5–15)
BUN: 17 mg/dL (ref 8–23)
CO2: 28 mmol/L (ref 22–32)
Calcium: 8.4 mg/dL — ABNORMAL LOW (ref 8.9–10.3)
Chloride: 105 mmol/L (ref 98–111)
Creatinine, Ser: 0.68 mg/dL (ref 0.44–1.00)
GFR, Estimated: >60 mL/min (ref >60)
Glucose, Bld: 105 mg/dL — ABNORMAL HIGH (ref 70–99)
Potassium: 3.4 mmol/L — ABNORMAL LOW (ref 3.5–5.1)
Sodium: 140 mmol/L (ref 135–145)
Total Bilirubin: 0.5 mg/dL (ref 0.3–1.2)
Total Protein: 6.1 g/dL — ABNORMAL LOW (ref 6.5–8.1)

## 2022-08-16 LAB — HEMOGLOBIN A1C
Hgb A1c MFr Bld: 6.3 % — ABNORMAL HIGH (ref 4.8–5.6)
Mean Plasma Glucose: 134.11 mg/dL

## 2022-08-16 LAB — PROCALCITONIN: Procalcitonin: <0.1 ng/mL

## 2022-08-16 LAB — RESPIRATORY PANEL BY PCR

## 2022-08-16 LAB — CULTURE, BLOOD (ROUTINE X 2): Culture: NO GROWTH

## 2022-08-16 LAB — PROTIME-INR
INR: 1.1 (ref 0.8–1.2)
Prothrombin Time: 13.9 seconds (ref 11.4–15.2)

## 2022-08-16 LAB — APTT: aPTT: 27 seconds (ref 24–36)

## 2022-08-16 MED ORDER — HYDROCOD POLI-CHLORPHE POLI ER 10-8 MG/5ML PO SUER
5.0000 mL | Freq: Two times a day (BID) | ORAL | Status: DC | PRN
  Administered 2022-08-16 (×2): 5 mL via ORAL
  Filled 2022-08-16 (×2): qty 5

## 2022-08-16 MED ORDER — GUAIFENESIN 100 MG/5ML PO LIQD: 5.0000 mL | ORAL | Status: DC | PRN

## 2022-08-16 MED ORDER — POTASSIUM CHLORIDE CRYS ER 20 MEQ PO TBCR
20.0000 meq | EXTENDED_RELEASE_TABLET | Freq: Once | ORAL | Status: AC
  Administered 2022-08-16: 20 meq via ORAL
  Filled 2022-08-16: qty 1

## 2022-08-16 MED ORDER — METHYLPREDNISOLONE SODIUM SUCC 40 MG IJ SOLR
40.0000 mg | Freq: Two times a day (BID) | INTRAMUSCULAR | Status: DC
  Administered 2022-08-16 – 2022-08-17 (×3): 40 mg via INTRAVENOUS
  Filled 2022-08-16 (×3): qty 1

## 2022-08-16 MED ORDER — IPRATROPIUM-ALBUTEROL 0.5-2.5 (3) MG/3ML IN SOLN
3.0000 mL | Freq: Four times a day (QID) | RESPIRATORY_TRACT | Status: DC | PRN
  Administered 2022-08-16 (×2): 3 mL via RESPIRATORY_TRACT
  Filled 2022-08-16 (×2): qty 3

## 2022-08-16 MED ORDER — ALPRAZOLAM 0.25 MG PO TABS
0.2500 mg | ORAL_TABLET | Freq: Two times a day (BID) | ORAL | Status: DC | PRN
  Administered 2022-08-16: 0.25 mg via ORAL
  Filled 2022-08-16: qty 1

## 2022-08-16 NOTE — Progress Notes (Signed)
   08/16/22 1600  Spiritual Encounters  Type of Visit Initial  Care provided to: Pt not available  Referral source Nurse (RN/NT/LPN)  Reason for visit Routine spiritual support  OnCall Visit Yes   Chaplain responded to nurse consult. Chaplain provided emotional/spiritual support. Patient tired and wants Chaplain to come back later.

## 2022-08-16 NOTE — TOC Progression Note (Signed)
Transition of Care [facility]) - Progression Note    Patient Details  Name: Sarah [name] MRN: [id number] Date of Birth: [birth year]  Transition of Care [facility]) CM/SW Contact  Marlowe Sax, RN Phone Number: 08/16/2022, 2:49 PM  Clinical Narrative:     Transition of Care [facility]) Screening Note   Patient Details  Name: Sarah [name] Date of Birth: 03-26-1951   Transition of Care [facility]) CM/SW Contact:    Marlowe Sax, RN Phone Number: 08/16/2022, 2:49 PM    Transition of Care Department [facility]) has reviewed patient and no TOC needs have been identified at this time. We will continue to monitor patient advancement through interdisciplinary progression rounds. If new patient transition needs arise, please place a TOC consult.          Expected Discharge Plan and Services   Discharge Planning Services: CM Consult   Living arrangements for the past 2 months: Single Family Home                   DME Agency: NA       HH Arranged: NA HH Agency: NA         Social Determinants of Health (SDOH) Interventions SDOH Screenings   Food Insecurity: No Food Insecurity (08/15/2022)  Housing: Low Risk  (08/15/2022)  Transportation Needs: No Transportation Needs (08/15/2022)  Utilities: Not At Risk (08/15/2022)  Depression (PHQ2-9): High Risk (08/15/2022)  Financial Resource Strain: Low Risk  (05/12/2022)  Physical Activity: Insufficiently Active (05/12/2022)  Social Connections: Unknown (05/12/2022)  Stress: No Stress Concern Present (05/12/2022)  Tobacco Use: Low Risk  (08/15/2022)    Readmission Risk Interventions     No data to display

## 2022-08-16 NOTE — Progress Notes (Addendum)
PROGRESS NOTE   HPI was taken from Dr. Maryjean Ka: Sarah [name] is a 72 y.o. female with medical history significant of asthma for which she reports 2 prior remote hospitalizations.  Patient was hospitalized most recently at [facility] health on April 18, 6 days ago, with a diagnosis of pneumonia.  Patient was actually hypoxic on presentation and treated with ceftriaxone and azithromycin with resolution of hypoxia.  Patient was discharged on April 20 home.  Friend at the bedside reports, and patient corroborates, that even on the day of discharge patient was having marked coughing and shortness of breath while on the way home via car.  Patient has continued to have shortness of breath at home and reports limitation of activities of daily living because of that.  Patient continues to report having cough with thick brown sputum production that she reports from the time of her last admission.  There is no report of leg swelling cramping chest pain fever presyncope or fall or loss of consciousness.  Patient was evaluated by primary care provider today and sent back to the ER because of reported respiratory distress.   Per report from the ER physician, patient was persistently tachypneic on evaluation in the ER, patient has received steroids at doctor's office as well as inhaled bronchodilators here in the ER.  There is no documentation of hypoxia.  However patient was given supplemental oxygen for comfort.  At this time patient reports improved sensation of shortness of breath since she got oxygen.  Medical evaluation is sought   NORI WINEGAR  XBM:[id number] DOB: [birth year] DOA: 08/15/2022 PCP: Sherlene Shams, MD   Assessment & Plan:   Principal Problem:   CAP (community acquired pneumonia) Active Problems:   Asthma exacerbation   Acute respiratory distress   Hyperglycemia  Assessment and Plan: Likely viral pneumonia: respiratory viral panel was positive for metapneumovirus & ? possible bacterial  co-infection. Previously taking rocephin, azithromycin w/ continued symptoms. Continue on IV unasyn & continue on azithromycin x 2 doses more. Consider d/c abxs in 1 or 2 days, as likely viral in etiology as procal < 0.10 & not elevated WBC (but will likely elevated tomorrow w/ starting steroids). Not requiring oxygen currently either. Continue on bronchodilators & encourage incentive spirometry. Legionella, strep were both neg. Will check mycoplasma.   Asthma exacerbation: unknown stage and/or severity. Continue on bronchodilators, steroids   Hypokalemia: potassium given  Anxiety: severity unknown. Xanax prn   Pre-DM: HbA1c 6.3. Needs education   Obesity: BMI 30.0. Would benefit from weight loss     DVT prophylaxis: lovenox  Code Status: full  Family Communication:  Disposition Plan: likely d/c back home  Level of care: Med-Surg  Status is: Inpatient Remains inpatient appropriate because: still short of breath & coughing     Consultants:  \  Procedures:   Antimicrobials:  unasyn, azithromycin    Subjective: Pt c/o cough and shortness of breath   Objective: Vitals:   08/15/22 2000 08/15/22 2100 08/15/22 2130 08/15/22 2359  BP: 121/64 (!) 118/58 132/62 (!) 113/49  Pulse: 88 80 82 72  Resp: 18 20 18 20   Temp:    97.6 F (36.4 C)  TempSrc:      SpO2: 98% 98% 100% 98%  Weight:   76.9 kg   Height:   5\' 3"  (1.6 m)     Intake/Output Summary (Last 24 hours) at 08/16/2022 0825 Last data filed at 08/16/2022 0631 Gross per 24 hour  Intake 1153.34 ml  Output --  Net 1153.34 ml   Filed Weights   08/15/22 2130  Weight: 76.9 kg    Examination:  General exam: Appears frustrated  Respiratory system: course breath sounds b/l. Wheezes b/l Cardiovascular system: S1 & S2 +. No rubs, gallops or clicks.  Gastrointestinal system: Abdomen is obese, soft and nontender. Normal bowel sounds heard. Central nervous system: Alert and oriented. Moves all extremities  Psychiatry:  Judgement and insight appears at baseline.Frustrated mood and affect      Data Reviewed: I have personally reviewed following labs and imaging studies  CBC: Recent Labs  Lab 08/11/22 1233 08/12/22 0502 08/15/22 1546 08/15/22 2146  WBC 5.5 4.6 8.3 9.1  NEUTROABS 3.9  --   --   --   HGB 11.2* 9.9* 10.9* 10.5*  HCT 33.5* 29.6* 32.3* 31.4*  MCV 107.4* 107.2* 105.6* 106.4*  PLT 115* 91* 130* 123*   Basic Metabolic Panel: Recent Labs  Lab 08/11/22 1233 08/12/22 0502 08/15/22 1546 08/15/22 2146 08/16/22 0644  NA 137 138 138  --  140  K 3.7 3.9 3.6  --  3.4*  CL 104 105 103  --  105  CO2 24 25 24   --  28  GLUCOSE 107* 205* 189*  --  105*  BUN 15 15 19   --  17  CREATININE 0.84 0.79 0.80 0.85 0.68  CALCIUM 9.0 9.2 8.9  --  8.4*   GFR: Estimated Creatinine Clearance: 61.5 mL/min (by C-G formula based on SCr of 0.68 mg/dL). Liver Function Tests: Recent Labs  Lab 08/11/22 1233 08/15/22 2146 08/16/22 0644  AST 80* 29 22  ALT 83* 35 33  ALKPHOS 98 69 59  BILITOT 0.6 0.5 0.5  PROT 7.1 7.1 6.1*  ALBUMIN 4.0 3.7 3.2*   No results for input(s): "LIPASE", "AMYLASE" in the last 168 hours. No results for input(s): "AMMONIA" in the last 168 hours. Coagulation Profile: Recent Labs  Lab 08/16/22 0644  INR 1.1   Cardiac Enzymes: No results for input(s): "CKTOTAL", "CKMB", "CKMBINDEX", "TROPONINI" in the last 168 hours. BNP (last 3 results) No results for input(s): "PROBNP" in the last 8760 hours. HbA1C: No results for input(s): "HGBA1C" in the last 72 hours. CBG: No results for input(s): "GLUCAP" in the last 168 hours. Lipid Profile: No results for input(s): "CHOL", "HDL", "LDLCALC", "TRIG", "CHOLHDL", "LDLDIRECT" in the last 72 hours. Thyroid Function Tests: No results for input(s): "TSH", "T4TOTAL", "FREET4", "T3FREE", "THYROIDAB" in the last 72 hours. Anemia Panel: No results for input(s): "VITAMINB12", "FOLATE", "FERRITIN", "TIBC", "IRON", "RETICCTPCT" in the last  72 hours. Sepsis Labs: No results for input(s): "PROCALCITON", "LATICACIDVEN" in the last 168 hours.  Recent Results (from the past 240 hour(s))  Expectorated Sputum Assessment w Gram Stain, Rflx to Resp Cult     Status: None   Collection Time: 08/11/22  2:51 PM   Specimen: Sputum  Result Value Ref Range Status   Specimen Description SPUTUM  Final   Special Requests NONE  Final   Sputum evaluation   Final    THIS SPECIMEN IS ACCEPTABLE FOR SPUTUM CULTURE Performed at [facility], [address] [place], [place] [address]    Report Status 08/11/2022 FINAL  Final  Culture, Respiratory w Gram Stain     Status: None   Collection Time: 08/11/22  2:51 PM   Specimen: SPU  Result Value Ref Range Status   Specimen Description   Final    SPUTUM Performed at [facility], [address], [place], [place] [address]  Special Requests   Final    NONE Reflexed from [id number] Performed at [facility], [address] [place], [place] [address]    Gram Stain   Final    RARE WBC PRESENT, PREDOMINANTLY PMN RARE GRAM POSITIVE COCCI IN PAIRS RARE GRAM NEGATIVE RODS    Culture   Final    FEW Normal respiratory flora-no Staph aureus or Pseudomonas seen Performed at [facility], [address] [place], [place] [address]    Report Status 08/14/2022 FINAL  Final  Culture, blood (routine x 2) Call MD if unable to obtain prior to antibiotics being given     Status: None   Collection Time: 08/11/22  3:32 PM   Specimen: BLOOD  Result Value Ref Range Status   Specimen Description BLOOD RIGHT ANTECUBITAL  Final   Special Requests   Final    BOTTLES DRAWN AEROBIC AND ANAEROBIC Blood Culture adequate volume   Culture   Final    NO GROWTH 5 DAYS Performed at [facility], [address] [place], [place] [address]    Report Status 08/16/2022 FINAL  Final  Culture, blood (routine x 2) Call MD if unable to obtain prior to antibiotics being given      Status: None   Collection Time: 08/11/22  3:37 PM   Specimen: BLOOD  Result Value Ref Range Status   Specimen Description BLOOD LEFT ANTECUBITAL  Final   Special Requests   Final    BOTTLES DRAWN AEROBIC AND ANAEROBIC Blood Culture results may not be optimal due to an inadequate volume of blood received in culture bottles   Culture   Final    NO GROWTH 5 DAYS Performed at [facility], [address] [place], [place] [address]    Report Status 08/16/2022 FINAL  Final  Respiratory (~20 pathogens) panel by PCR     Status: Abnormal   Collection Time: 08/15/22  9:26 PM   Specimen: Nasopharyngeal Swab; Respiratory  Result Value Ref Range Status   Adenovirus NOT DETECTED NOT DETECTED Final   Coronavirus 229E NOT DETECTED NOT DETECTED Final    Comment: (NOTE) The Coronavirus on the Respiratory Panel, DOES NOT test for the novel  Coronavirus (2019 nCoV)    Coronavirus HKU1 NOT DETECTED NOT DETECTED Final   Coronavirus NL63 NOT DETECTED NOT DETECTED Final   Coronavirus OC43 NOT DETECTED NOT DETECTED Final   Metapneumovirus DETECTED (A) NOT DETECTED Final   Rhinovirus / Enterovirus NOT DETECTED NOT DETECTED Final   Influenza A NOT DETECTED NOT DETECTED Final   Influenza B NOT DETECTED NOT DETECTED Final   Parainfluenza Virus 1 NOT DETECTED NOT DETECTED Final   Parainfluenza Virus 2 NOT DETECTED NOT DETECTED Final   Parainfluenza Virus 3 NOT DETECTED NOT DETECTED Final   Parainfluenza Virus 4 NOT DETECTED NOT DETECTED Final   Respiratory Syncytial Virus NOT DETECTED NOT DETECTED Final   Bordetella pertussis NOT DETECTED NOT DETECTED Final   Bordetella Parapertussis NOT DETECTED NOT DETECTED Final   Chlamydophila pneumoniae NOT DETECTED NOT DETECTED Final   Mycoplasma pneumoniae NOT DETECTED NOT DETECTED Final    Comment: Performed at [facility], [address], [place], [place] [address]  MRSA Next Gen by PCR, Nasal     Status: None   Collection Time: 08/15/22  9:26 PM    Specimen: Nasopharyngeal Swab; Nasal Swab  Result Value Ref Range Status   MRSA by PCR Next Gen NOT DETECTED NOT DETECTED Final  Comment: (NOTE) The GeneXpert MRSA Assay (FDA approved for NASAL specimens only), is one component of a comprehensive MRSA colonization surveillance program. It is not intended to diagnose MRSA infection nor to guide or monitor treatment for MRSA infections. Test performance is not FDA approved in patients less than 72 years old. Performed at [facility], [address] [place], [place] [address]   Culture, blood (Routine X 2) w Reflex to ID Panel     Status: None (Preliminary result)   Collection Time: 08/15/22  9:46 PM   Specimen: BLOOD  Result Value Ref Range Status   Specimen Description BLOOD LEFT ASSIST CONTROL  Final   Special Requests   Final    BOTTLES DRAWN AEROBIC AND ANAEROBIC Blood Culture results may not be optimal due to an excessive volume of blood received in culture bottles   Culture   Final    NO GROWTH < 12 HOURS Performed at [facility], [address] [place], [place] [address]    Report Status PENDING  Incomplete  Culture, blood (Routine X 2) w Reflex to ID Panel     Status: None (Preliminary result)   Collection Time: 08/15/22  9:46 PM   Specimen: BLOOD  Result Value Ref Range Status   Specimen Description BLOOD RIGHT HAND  Final   Special Requests   Final    BOTTLES DRAWN AEROBIC AND ANAEROBIC Blood Culture results may not be optimal due to an excessive volume of blood received in culture bottles   Culture   Final    NO GROWTH < 12 HOURS Performed at [facility], [address] [place], [place] [address]    Report Status PENDING  Incomplete         Radiology Studies: CT CHEST WO CONTRAST  Result Date: 08/15/2022 CLINICAL DATA:  Recent pneumonia.  Worsening shortness of breath. EXAM: CT CHEST WITHOUT CONTRAST TECHNIQUE: Multidetector CT imaging of the chest was performed following  the standard protocol without IV contrast. RADIATION DOSE REDUCTION: This exam was performed according to the departmental dose-optimization program which includes automated exposure control, adjustment of the mA and/or kV according to patient size and/or use of iterative reconstruction technique. COMPARISON:  Chest x-ray today. FINDINGS: Cardiovascular: Cardiomegaly. Small pericardial effusion. Scattered coronary artery and aortic calcifications. No aneurysm. Mediastinum/Nodes: Scattered borderline sized mediastinal lymph nodes. AP window lymph node has a short axis diameter of 7 mm. Other similarly sized mediastinal lymph nodes. None pathologically enlarged. No axillary adenopathy. Lungs/Pleura: Patchy ground-glass tree-in-bud nodular densities seen within the right upper lobe. No confluent opacities or effusions. Upper Abdomen: No acute findings Musculoskeletal: Chest wall soft tissues are unremarkable. No acute bony abnormality. IMPRESSION: Cardiomegaly, small pericardial effusion. Coronary artery disease. Vague scattered ground-glass tree-in-bud nodular densities within the right upper lobe. This likely reflects small airways disease/alveolitis/bronchiolitis. Aortic Atherosclerosis (ICD10-I70.0). Electronically Signed   By: Charlett Nose M.D.   On: 08/15/2022 21:22   DG Chest 2 View  Result Date: 08/15/2022 CLINICAL DATA:  Shortness of breath. EXAM: CHEST - 2 VIEW COMPARISON:  08/11/2022. FINDINGS: Improved aeration of both lungs. No consolidation or pulmonary edema. Stable cardiac and mediastinal contours. No pleural effusion or pneumothorax. IMPRESSION: No evidence of acute cardiopulmonary disease. Electronically Signed   By: Orvan Falconer M.D.   On: 08/15/2022 16:25        Scheduled Meds:  amLODipine  5 mg Oral Daily   citalopram  20 mg Oral Daily   cyanocobalamin  1,000 mcg Oral Daily  enoxaparin (LOVENOX) injection  40 mg Subcutaneous Q24H   famotidine  10 mg Oral Daily   fesoterodine  4  mg Oral Daily   fluticasone furoate-vilanterol  1 puff Inhalation Daily   And   umeclidinium bromide  1 puff Inhalation Daily   gabapentin  100 mg Oral TID   levothyroxine  50 mcg Oral Q0600   pantoprazole  40 mg Oral Daily   predniSONE  50 mg Oral Q breakfast   rosuvastatin  10 mg Oral QHS   sodium chloride flush  3 mL Intravenous Q12H   Continuous Infusions:  ampicillin-sulbactam (UNASYN) IV Stopped (08/16/22 0631)   azithromycin Stopped (08/16/22 0218)   vancomycin       LOS: 1 day    Time spent: 35 mins     Charise Killian, MD [facility] Pager [phone]  If 7PM-7AM, please contact night-coverage [email or web address] 08/16/2022, 8:25 AM

## 2022-08-17 ENCOUNTER — Ambulatory Visit: Payer: Medicare Other | Admitting: Internal Medicine

## 2022-08-17 DIAGNOSIS — J123 Human metapneumovirus pneumonia: Secondary | ICD-10-CM | POA: Diagnosis not present

## 2022-08-17 LAB — EXPECTORATED SPUTUM ASSESSMENT W GRAM STAIN, RFLX TO RESP C

## 2022-08-17 LAB — CULTURE, BLOOD (ROUTINE X 2)

## 2022-08-17 MED ORDER — LEVALBUTEROL HCL 0.63 MG/3ML IN NEBU
0.6300 mg | INHALATION_SOLUTION | Freq: Two times a day (BID) | RESPIRATORY_TRACT | Status: DC
  Administered 2022-08-17 – 2022-08-19 (×4): 0.63 mg via RESPIRATORY_TRACT
  Filled 2022-08-17 (×4): qty 3

## 2022-08-17 MED ORDER — ACETYLCYSTEINE 20 % IN SOLN
4.0000 mL | Freq: Two times a day (BID) | RESPIRATORY_TRACT | Status: DC
  Administered 2022-08-17 – 2022-08-18 (×3): 4 mL via RESPIRATORY_TRACT
  Filled 2022-08-17 (×4): qty 4

## 2022-08-17 MED ORDER — IPRATROPIUM BROMIDE 0.02 % IN SOLN
0.5000 mg | Freq: Two times a day (BID) | RESPIRATORY_TRACT | Status: DC
  Administered 2022-08-17 – 2022-08-19 (×4): 0.5 mg via RESPIRATORY_TRACT
  Filled 2022-08-17 (×4): qty 2.5

## 2022-08-17 MED ORDER — PREDNISONE 20 MG PO TABS
40.0000 mg | ORAL_TABLET | Freq: Every day | ORAL | Status: DC
  Administered 2022-08-18 – 2022-08-19 (×2): 40 mg via ORAL
  Filled 2022-08-17 (×2): qty 2

## 2022-08-17 NOTE — Progress Notes (Signed)
Mobility Specialist - Progress Note   08/17/22 1530  Mobility  Activity Ambulated independently to bathroom  Level of Assistance Modified independent, requires aide device or extra time  Assistive Device None  Distance Ambulated (ft) 20 ft  Activity Response Tolerated well  $Mobility charge 1 Mobility   Tangy Drozdowski Mobility Specialist 08/17/22 3:31 PM

## 2022-08-17 NOTE — Consult Note (Signed)
Pharmacy Antibiotic Note  Sarah [name] is a 72 y.o. female admitted on 08/15/2022 with pneumonia.  Pharmacy has been consulted for unasyn dosing.  Plan: Continue Unasyn 3 grams IV every 6 hours  Continue to monitor and dose adjust antibiotics according to renal function and indication   Height:  (160 cm) Weight: 76.9 kg (169 lb 8.5 oz) IBW/kg (Calculated) : 52.4  Temp (24hrs), Avg:97.8 F (36.6 C), Min:97.5 F (36.4 C), Max:98.2 F (36.8 C)  Recent Labs  Lab 08/11/22 1233 08/12/22 0502 08/15/22 1546 08/15/22 2146 08/16/22 0644  WBC 5.5 4.6 8.3 9.1  --   CREATININE 0.84 0.79 0.80 0.85 0.68     Estimated Creatinine Clearance: 61.5 mL/min (by C-G formula based on SCr of 0.68 mg/dL).    No Known Allergies  Antimicrobials this admission: 4/22 Vancomycin >> 4/23 4/22 Unasyn >>  4/22 Azithromycin >> 4/23  Microbiology results: 4/22 BCx: ngtd 4/23 Sputum: unacceptable specimen  4/22 Resp Pathogen panel: Metapneumovirus 4/22: MRSA PCR: negative  Thank you for allowing pharmacy to be a part of this patient's care.  Barrie Folk, PharmD 08/17/2022 8:07 AM

## 2022-08-17 NOTE — Progress Notes (Signed)
   08/17/22 1600  Spiritual Encounters  Type of Visit Initial  Care provided to: Pt not available  Referral source Chaplain team  Reason for visit Routine spiritual support  OnCall Visit Yes   Patient sleeping

## 2022-08-17 NOTE — Progress Notes (Signed)
PROGRESS NOTE    Sarah LEMIRE  [name]:[id number] DOB: [birth year] DOA: 08/15/2022 PCP: Sherlene Shams, MD  [id number]  LOS: 2 days   Brief hospital course:   Assessment & Plan: Sarah [name] is a 72 y.o. female with medical history significant of asthma for which she reports 2 prior remote hospitalizations.  Patient was hospitalized most recently at [facility] health on April 18, 6 days ago, with a diagnosis of pneumonia.  Patient was actually hypoxic on presentation and treated with ceftriaxone and azithromycin with resolution of hypoxia.  Patient was discharged on April 20 home.  Friend at the bedside reports, and patient corroborates, that even on the day of discharge patient was having marked coughing and shortness of breath while on the way home via car.  Patient has continued to have shortness of breath at home and reports limitation of activities of daily living because of that.  Patient continues to report having cough with thick brown sputum production that she reports from the time of her last admission.  There is no report of leg swelling cramping chest pain fever presyncope or fall or loss of consciousness.  Patient was evaluated by primary care provider today and sent back to the ER because of reported respiratory distress.   Per report from the ER physician, patient was persistently tachypneic on evaluation in the ER, patient has received steroids at doctor's office as well as inhaled bronchodilators here in the ER.  There is no documentation of hypoxia.  However patient was given supplemental oxygen for comfort.  At this time patient reports improved sensation of shortness of breath since she got oxygen.  Medical evaluation is sought  Viral pneumonia 2/2 metapneumovirus  --no leukocytosis, procal neg, no strong evidence of bacterial PNA. --d/c IV Unasyn --Mucomyst neb, Xopenex neb and Atrovent neb BID   Asthma exacerbation: unknown stage and/or severity.  --transition to prednisone  40 mg daily tomorrow --Mucomyst neb, Xopenex neb and Atrovent neb BID   Hypokalemia:  --monitor and replete PRN   Anxiety: severity unknown. Xanax prn    Pre-DM: HbA1c 6.3. Needs education    Obesity: BMI 30.0. Would benefit from weight loss     DVT prophylaxis: Lovenox SQ Code Status: Full code  Family Communication: friend updated at bedside today Level of care: Med-Surg Dispo:   The patient is from: home Anticipated d/c is to: home Anticipated d/c date is: tomorrow   Subjective and Interval History:  Breathing and cough mildly improved.   Objective: Vitals:   08/17/22 0008 08/17/22 0836 08/17/22 1559 08/17/22 1613  BP: 125/67 115/60 110/62   Pulse: 70 68 76   Resp: Temp: 97.7 F (36.5 C) 98.1 F (36.7 C) 97.9 F (36.6 C)   TempSrc:      SpO2: 93% 96% 95% 96%  Weight:      Height:        Intake/Output Summary (Last 24 hours) at 08/17/2022 1855 Last data filed at 08/17/2022 8119 Gross per 24 hour  Intake 253.5 ml  Output --  Net 253.5 ml   Filed Weights   08/15/22 2130  Weight: 76.9 kg    Examination:   Constitutional: NAD, AAOx3 HEENT: conjunctivae and lids normal, EOMI CV: No cyanosis.   RESP: normal respiratory effort, mild wheezing, on RA Extremities: No effusions, edema in BLE SKIN: warm, dry Neuro: II - XII grossly intact.   Psych: Normal mood and affect.  Appropriate judgement and reason   Data  Reviewed: I have personally reviewed labs and imaging studies  Time spent: 35 minutes  Darlin Priestly, MD [facility] If 7PM-7AM, please contact night-coverage 08/17/2022, 6:55 PM

## 2022-08-18 DIAGNOSIS — B348 Other viral infections of unspecified site: Secondary | ICD-10-CM

## 2022-08-18 LAB — CBC
HCT: 28.6 % — ABNORMAL LOW (ref 36.0–46.0)
Hemoglobin: 9.4 g/dL — ABNORMAL LOW (ref 12.0–15.0)
MCH: 36.2 pg — ABNORMAL HIGH (ref 26.0–34.0)
MCHC: 32.9 g/dL (ref 30.0–36.0)
MCV: 110 fL — ABNORMAL HIGH (ref 80.0–100.0)
Platelets: 115 10*3/uL — ABNORMAL LOW (ref 150–400)
RBC: 2.6 MIL/uL — ABNORMAL LOW (ref 3.87–5.11)
RDW: 13.6 % (ref 11.5–15.5)
WBC: 11 10*3/uL — ABNORMAL HIGH (ref 4.0–10.5)
nRBC: 0 % (ref 0.0–0.2)

## 2022-08-18 LAB — BASIC METABOLIC PANEL
Anion gap: 8 (ref 5–15)
BUN: 22 mg/dL (ref 8–23)
CO2: 26 mmol/L (ref 22–32)
Calcium: 8.5 mg/dL — ABNORMAL LOW (ref 8.9–10.3)
Chloride: 106 mmol/L (ref 98–111)
Creatinine, Ser: 0.71 mg/dL (ref 0.44–1.00)
GFR, Estimated: >60 mL/min (ref >60)
Glucose, Bld: 156 mg/dL — ABNORMAL HIGH (ref 70–99)
Potassium: 3.6 mmol/L (ref 3.5–5.1)
Sodium: 140 mmol/L (ref 135–145)

## 2022-08-18 LAB — CULTURE, BLOOD (ROUTINE X 2): Culture: NO GROWTH

## 2022-08-18 LAB — MAGNESIUM: Magnesium: 2.2 mg/dL (ref 1.7–2.4)

## 2022-08-18 LAB — MYCOPLASMA PNEUMONIAE ANTIBODY, IGM: Mycoplasma pneumo IgM: <770 U/mL (ref 0–769)

## 2022-08-18 NOTE — Plan of Care (Signed)

## 2022-08-18 NOTE — Progress Notes (Signed)
Mobility Specialist - Progress Note  Pre-mobility: SpO2 99% During mobility: SpO2 97% Post-mobility: SPO2 96%   08/18/22 1147  Mobility  Activity Ambulated with assistance in hallway;Ambulated independently in hallway  Level of Assistance Modified independent, requires aide device or extra time  Assistive Device None  Distance Ambulated (ft) 180 ft  Activity Response Tolerated well  $Mobility charge 1 Mobility   Pt supine upon entry, utilizing RA. Pt agreeable to OOB amb in the hallway this date. Pt completed bed mob indep, STS and amb ModI. Pt expressed feeling "a little lightheaded" upon standing, however willing to amb. Pt amb one lap around NS, O2 >90% with some SOB present ~136ft into amb. While amb Pt stated the lightheadedness was "becoming less". Pt returned to room, expressed feeling "tired" and BLE "feeling like jello". Pt left supine with needs within reach.   Sarah [name] Mobility Specialist 08/18/22 11:53 AM

## 2022-08-18 NOTE — Progress Notes (Signed)
PROGRESS NOTE    [name]  ZOX:[id number] DOB: July 28, 1950 DOA: 08/15/2022 PCP: Sherlene Shams, MD  [id number]/[id number]  LOS: 3 days   Brief hospital course:   Assessment & Plan: Sarah [name] is a 72 y.o. female with medical history significant of asthma for which she reports 2 prior remote hospitalizations.  Patient was hospitalized most recently at [facility] health on April 18, 6 days ago, with a diagnosis of pneumonia.  Patient was actually hypoxic on presentation and treated with ceftriaxone and azithromycin with resolution of hypoxia.  Patient was discharged on April 20 home.  Friend at the bedside reports, and patient corroborates, that even on the day of discharge patient was having marked coughing and shortness of breath while on the way home via car.  Patient has continued to have shortness of breath at home and reports limitation of activities of daily living because of that.  Patient continues to report having cough with thick brown sputum production that she reports from the time of her last admission.  There is no report of leg swelling cramping chest pain fever presyncope or fall or loss of consciousness.  Patient was evaluated by primary care provider today and sent back to the ER because of reported respiratory distress.   Per report from the ER physician, patient was persistently tachypneic on evaluation in the ER, patient has received steroids at doctor's office as well as inhaled bronchodilators here in the ER.  There is no documentation of hypoxia.  However patient was given supplemental oxygen for comfort.  At this time patient reports improved sensation of shortness of breath since she got oxygen.  Medical evaluation is sought  Viral pneumonia 2/2 metapneumovirus  --no leukocytosis, procal neg, no strong evidence of bacterial PNA. --d/c'ed IV Unasyn --cont Mucomyst neb, Xopenex neb and Atrovent neb BID   Asthma exacerbation: unknown stage and/or severity.  --IV solumedrol  transitioned to prednisone --cont prednisone 40 mg daily --Mucomyst neb, Xopenex neb and Atrovent neb BID   Hypokalemia:  --monitor and replete PRN   Anxiety: severity unknown.  --not on Xanax at home, d/c today   Pre-DM: HbA1c 6.3.     Obesity: BMI 30.0. Would benefit from weight loss     DVT prophylaxis: Lovenox SQ Code Status: Full code  Family Communication:  Level of care: Med-Surg Dispo:   The patient is from: home Anticipated d/c is to: home Anticipated d/c date is: tomorrow   Subjective and Interval History:  Pt reported the neb helped.  But had a bad night, felt oozy when up, and didn't feel safe to go home.  Mobility tech worked with pt, and pt did better later with better activity tolerance.   Objective: Vitals:   08/17/22 2251 08/18/22 0745 08/18/22 0756 08/18/22 1518  BP: (!) 112/51  122/71 (!) 111/52  Pulse: 74  87 79  Resp: Temp: 98.7 F (37.1 C)  98.3 F (36.8 C) 98.1 F (36.7 C)  TempSrc:      SpO2: 94% 95% 100% 96%  Weight:      Height:       No intake or output data in the 24 hours ending 08/18/22 1743  Filed Weights   08/15/22 2130  Weight: 76.9 kg    Examination:   Constitutional: NAD, AAOx3 HEENT: conjunctivae and lids normal, EOMI CV: No cyanosis.   RESP: normal respiratory effort, on RA Neuro: II - XII grossly intact.     Data Reviewed: I  have personally reviewed labs and imaging studies  Time spent: 35 minutes  Darlin Priestly, MD [facility] If 7PM-7AM, please contact night-coverage 08/18/2022, 5:43 PM

## 2022-08-18 NOTE — Care Management Important Message (Signed)
Important Message  Patient Details  Name: Sarah [name] MRN: [id number] Date of Birth: [birth year]   Medicare Important Message Given:  N/A - LOS <3 / Initial given by admissions     Olegario Messier A [name] 08/18/2022, 8:59 AM

## 2022-08-19 ENCOUNTER — Telehealth: Payer: Self-pay

## 2022-08-19 ENCOUNTER — Telehealth: Payer: Self-pay | Admitting: Internal Medicine

## 2022-08-19 ENCOUNTER — Other Ambulatory Visit: Payer: Self-pay | Admitting: *Deleted

## 2022-08-19 DIAGNOSIS — J189 Pneumonia, unspecified organism: Secondary | ICD-10-CM

## 2022-08-19 LAB — BASIC METABOLIC PANEL WITH GFR
Anion gap: 4 — ABNORMAL LOW (ref 5–15)
BUN: 27 mg/dL — ABNORMAL HIGH (ref 8–23)
CO2: 30 mmol/L (ref 22–32)
Calcium: 8.1 mg/dL — ABNORMAL LOW (ref 8.9–10.3)
Chloride: 105 mmol/L (ref 98–111)
Creatinine, Ser: 0.71 mg/dL (ref 0.44–1.00)
GFR, Estimated: >60 mL/min
Glucose, Bld: 120 mg/dL — ABNORMAL HIGH (ref 70–99)
Potassium: 3.5 mmol/L (ref 3.5–5.1)
Sodium: 139 mmol/L (ref 135–145)

## 2022-08-19 LAB — CULTURE, BLOOD (ROUTINE X 2)

## 2022-08-19 LAB — CBC
HCT: 28.1 % — ABNORMAL LOW (ref 36.0–46.0)
Hemoglobin: 9.1 g/dL — ABNORMAL LOW (ref 12.0–15.0)
MCH: 35.3 pg — ABNORMAL HIGH (ref 26.0–34.0)
MCHC: 32.4 g/dL (ref 30.0–36.0)
MCV: 108.9 fL — ABNORMAL HIGH (ref 80.0–100.0)
Platelets: 105 10*3/uL — ABNORMAL LOW (ref 150–400)
RBC: 2.58 MIL/uL — ABNORMAL LOW (ref 3.87–5.11)
RDW: 13.7 % (ref 11.5–15.5)
WBC: 8.7 10*3/uL (ref 4.0–10.5)
nRBC: 0 % (ref 0.0–0.2)

## 2022-08-19 LAB — MAGNESIUM: Magnesium: 2.2 mg/dL (ref 1.7–2.4)

## 2022-08-19 MED ORDER — HYDROCOD POLI-CHLORPHE POLI ER 10-8 MG/5ML PO SUER: 5.0000 mL | Freq: Two times a day (BID) | ORAL | 0 refills | Status: DC | PRN

## 2022-08-19 MED ORDER — GABAPENTIN 100 MG PO CAPS: 300.0000 mg | ORAL_CAPSULE | Freq: Every day | ORAL | Status: DC

## 2022-08-19 MED ORDER — ROSUVASTATIN CALCIUM 10 MG PO TABS
10.0000 mg | ORAL_TABLET | Freq: Every day | ORAL | Status: DC
Start: 2022-08-19 — End: 2023-03-16

## 2022-08-19 NOTE — Telephone Encounter (Signed)
noted 

## 2022-08-19 NOTE — Progress Notes (Signed)
DISCHARGE NOTE:   Pt discharged with belongings bag and IV removed. Pt and pt's friend received discharge instructions, no further questions or concerns at this time. Transportation provided via pt's friend.

## 2022-08-19 NOTE — Plan of Care (Signed)

## 2022-08-19 NOTE — Addendum Note (Signed)
Addended by: Sherlene Shams on: 08/19/2022 05:24 PM   Modules accepted: Orders

## 2022-08-19 NOTE — Discharge Summary (Signed)
Physician Discharge Summary   Sarah [name]  female DOB: 08/25/1950  UJW:[id number]  PCP: Sherlene Shams, MD  Admit date: 08/15/2022 Discharge date: 08/19/2022  Admitted From: home Disposition:  home CODE STATUS: Full code   Hospital Course:  For full details, please see H&P, progress notes, consult notes and ancillary notes.  Briefly,  Sarah [name] is a 72 y.o. female with medical history significant of asthma for which she reports 2 prior remote hospitalizations.    Patient was hospitalized most recently at [facility] health on April 18, 6 days ago, with a diagnosis of pneumonia.  Patient was actually hypoxic on presentation and treated with ceftriaxone and azithromycin with resolution of hypoxia.  Patient was discharged on April 20 home.  Patient has continued to have shortness of breath at home and reports limitation of activities of daily living because of that.  Patient was evaluated by primary care provider and sent to the ER because of reported respiratory distress.   Per report from the ER physician, patient was persistently tachypneic on evaluation in the ER, patient has received steroids at doctor's office as well as inhaled bronchodilators here in the ER.  There is no documentation of hypoxia.  However patient was given supplemental oxygen for comfort.     Respiratory distress Viral pneumonia 2/2 metapneumovirus  --no leukocytosis, procal neg, no strong evidence of bacterial PNA. --d/c'ed IV Unasyn --received Mucomyst neb, Xopenex neb and Atrovent neb BID during hospitalization for mucus clearance. --Pt was never hypoxic, however, pt was kept 4 days in the hospital due to complaints of dyspnea and cough, and not ready to go home, and this being a re-admission.  Prior to discharge, symptoms appeared improved, and pt was able to ambulate.    Asthma exacerbation:  unknown stage and/or severity.  --IV solumedrol transitioned to prednisone 40 mg.  Pt received 4 days of  steroid burst during hospitalization, and was not discharged on more steroid.   Hypokalemia:  --monitored and repleted PRN   Anxiety: severity unknown.  --not on Xanax at home   Pre-DM: HbA1c 6.3.     Obesity: BMI 30.0. Would benefit from weight loss    Unless noted above, medications under "STOP" list are ones pt was not taking PTA.  Discharge Diagnoses:  Principal Problem:   CAP (community acquired pneumonia) Active Problems:   Asthma exacerbation   Acute respiratory distress   Hyperglycemia   30 Day Unplanned Readmission Risk Score    Flowsheet Row ED to Hosp-Admission (Current) from 08/15/2022 in [facility] (1A)  30 Day Unplanned Readmission Risk Score (%) 21.6 Filed at 08/19/2022 0401       This score is the patient's risk of an unplanned readmission within 30 days of being discharged (0 -100%). The score is based on dignosis, age, lab data, medications, orders, and past utilization.   Low:  0-14.9   Medium: 15-21.9   High: 22-29.9   Extreme: 30 and above         Discharge Instructions:  Allergies as of 08/19/2022   No Known Allergies      Medication List     STOP taking these medications    amoxicillin-clavulanate 875-125 MG tablet Commonly known as: AUGMENTIN   azithromycin 250 MG tablet Commonly known as: ZITHROMAX   ipratropium-albuterol 0.5-2.5 (3) MG/3ML Soln Commonly known as: DUONEB   predniSONE 50 MG tablet Commonly known as: DELTASONE       TAKE these medications  albuterol 108 (90 Base) MCG/ACT inhaler Commonly known as: VENTOLIN HFA Inhale 1-2 puffs into the lungs every 4 (four) hours as needed for wheezing or shortness of breath.   amLODipine 5 MG tablet Commonly known as: NORVASC TAKE 1 TABLET BY MOUTH DAILY   CALCIUM CITRATE + D PO Take 1 tablet by mouth 2 (two) times daily.   citalopram 20 MG tablet Commonly known as: CELEXA TAKE 1 TABLET BY MOUTH DAILY   cyanocobalamin 1000 MCG  tablet Commonly known as: VITAMIN B12 Take 1,000 mcg by mouth daily.   dextromethorphan-guaiFENesin 30-600 MG 12hr tablet Commonly known as: MUCINEX DM Take 1 tablet by mouth 2 (two) times daily as needed for cough.   famotidine 20 MG tablet Commonly known as: PEPCID TAKE 1 TABLET BY MOUTH DAILY. BEFORE DINNER   ferrous sulfate 324 MG Tbec Take 324 mg by mouth. 65 mg of elemental iron   fexofenadine 180 MG tablet Commonly known as: ALLEGRA Take 180 mg by mouth daily.   gabapentin 100 MG capsule Commonly known as: NEURONTIN Take 3 capsules (300 mg total) by mouth at bedtime. Home med. What changed:  how much to take when to take this additional instructions   levothyroxine 50 MCG tablet Commonly known as: SYNTHROID TAKE 1 TABLET BY MOUTH DAILY ON  EMPTY STOMACH WITH A GLASS OF  WATER AT LEAST 30 TO 60 MIN  BEFORE BREAKFAST   pantoprazole 40 MG tablet Commonly known as: PROTONIX TAKE 1 TABLET BY MOUTH DAILY   rosuvastatin 10 MG tablet Commonly known as: CRESTOR Take 1 tablet (10 mg total) by mouth at bedtime. Home med. What changed:  when to take this additional instructions   solifenacin 5 MG tablet Commonly known as: VESICARE TAKE ONE TABLET BY MOUTH EVERY DAY   traZODone 50 MG tablet Commonly known as: DESYREL TAKE 1/2 TO 1 TABLET BY MOUTH AT BEDTIME AS NEEDED FOR SLEEP   Trelegy Ellipta 100-62.5-25 MCG/ACT Aepb Generic drug: Fluticasone-Umeclidin-Vilant Inhale 1 puff into the lungs daily.         Follow-up Information     Sherlene Shams, MD Follow up in 1 week(s).   Specialty: Internal Medicine Contact information: [address] [place] [address] [phone]                 No Known Allergies   The results of significant diagnostics from this hospitalization (including imaging, microbiology, ancillary and laboratory) are listed below for reference.   Consultations:   Procedures/Studies: CT CHEST WO  CONTRAST  Result Date: 08/15/2022 CLINICAL DATA:  Recent pneumonia.  Worsening shortness of breath. EXAM: CT CHEST WITHOUT CONTRAST TECHNIQUE: Multidetector CT imaging of the chest was performed following the standard protocol without IV contrast. RADIATION DOSE REDUCTION: This exam was performed according to the departmental dose-optimization program which includes automated exposure control, adjustment of the mA and/or kV according to patient size and/or use of iterative reconstruction technique. COMPARISON:  Chest x-ray today. FINDINGS: Cardiovascular: Cardiomegaly. Small pericardial effusion. Scattered coronary artery and aortic calcifications. No aneurysm. Mediastinum/Nodes: Scattered borderline sized mediastinal lymph nodes. AP window lymph node has a short axis diameter of 7 mm. Other similarly sized mediastinal lymph nodes. None pathologically enlarged. No axillary adenopathy. Lungs/Pleura: Patchy ground-glass tree-in-bud nodular densities seen within the right upper lobe. No confluent opacities or effusions. Upper Abdomen: No acute findings Musculoskeletal: Chest wall soft tissues are unremarkable. No acute bony abnormality. IMPRESSION: Cardiomegaly, small pericardial effusion. Coronary artery disease. Vague scattered ground-glass tree-in-bud nodular  densities within the right upper lobe. This likely reflects small airways disease/alveolitis/bronchiolitis. Aortic Atherosclerosis (ICD10-I70.0). Electronically Signed   By: Charlett Nose M.D.   On: 08/15/2022 21:22   DG Chest 2 View  Result Date: 08/15/2022 CLINICAL DATA:  Shortness of breath. EXAM: CHEST - 2 VIEW COMPARISON:  08/11/2022. FINDINGS: Improved aeration of both lungs. No consolidation or pulmonary edema. Stable cardiac and mediastinal contours. No pleural effusion or pneumothorax. IMPRESSION: No evidence of acute cardiopulmonary disease. Electronically Signed   By: Orvan Falconer M.D.   On: 08/15/2022 16:25   DG Chest 2 View  Result  Date: 08/11/2022 CLINICAL DATA:  SOB, Wheezing EXAM: CHEST - 2 VIEW COMPARISON:  CXR 04/07/22 FINDINGS: No pleural effusion. No pneumothorax. Normal cardiac and mediastinal contours. New patchy airspace opacities in the right mid and lower lung fields are worrisome for infection. No radiographically apparent displaced rib fractures. Cervical spinal hardware in place. Visualized upper abdomen is unremarkable. IMPRESSION: New patchy airspace opacities in right mid and lower lung fields are worrisome for infection. Electronically Signed   By: Lorenza Cambridge M.D.   On: 08/11/2022 13:30      Labs: BNP (last 3 results) No results for input(s): "BNP" in the last 8760 hours. Basic Metabolic Panel: Recent Labs  Lab 08/15/22 1546 08/15/22 2146 08/16/22 0644 08/18/22 0602 08/19/22 0540  NA 138  --  140 140 139  K 3.6  --  3.4* 3.6 3.5  CL 103  --  105 106 105  CO2 24  --  28 26 30   GLUCOSE 189*  --  105* 156* 120*  BUN 19  --  17 22 27*  CREATININE 0.80 0.85 0.68 0.71 0.71  CALCIUM 8.9  --  8.4* 8.5* 8.1*  MG  --   --   --  2.2 2.2   Liver Function Tests: Recent Labs  Lab 08/15/22 2146 08/16/22 0644  AST 29 22  ALT 35 33  ALKPHOS 69 59  BILITOT 0.5 0.5  PROT 7.1 6.1*  ALBUMIN 3.7 3.2*   No results for input(s): "LIPASE", "AMYLASE" in the last 168 hours. No results for input(s): "AMMONIA" in the last 168 hours. CBC: Recent Labs  Lab 08/15/22 1546 08/15/22 2146 08/18/22 0602 08/19/22 0540  WBC 8.3 9.1 11.0* 8.7  HGB 10.9* 10.5* 9.4* 9.1*  HCT 32.3* 31.4* 28.6* 28.1*  MCV 105.6* 106.4* 110.0* 108.9*  PLT 130* 123* 115* 105*   Cardiac Enzymes: No results for input(s): "CKTOTAL", "CKMB", "CKMBINDEX", "TROPONINI" in the last 168 hours. BNP: Invalid input(s): "POCBNP" CBG: No results for input(s): "GLUCAP" in the last 168 hours. D-Dimer No results for input(s): "DDIMER" in the last 72 hours. Hgb A1c No results for input(s): "HGBA1C" in the last 72 hours. Lipid Profile No  results for input(s): "CHOL", "HDL", "LDLCALC", "TRIG", "CHOLHDL", "LDLDIRECT" in the last 72 hours. Thyroid function studies No results for input(s): "TSH", "T4TOTAL", "T3FREE", "THYROIDAB" in the last 72 hours.  Invalid input(s): "FREET3" Anemia work up No results for input(s): "VITAMINB12", "FOLATE", "FERRITIN", "TIBC", "IRON", "RETICCTPCT" in the last 72 hours. Urinalysis    Component Value Date/Time   COLORURINE YELLOW 05/18/2022 1424   APPEARANCEUR Cloudy (A) 05/18/2022 1424   LABSPEC 1.020 05/18/2022 1424   PHURINE 6.0 05/18/2022 1424   GLUCOSEU NEGATIVE 05/18/2022 1424   HGBUR SMALL (A) 05/18/2022 1424   BILIRUBINUR NEGATIVE 05/18/2022 1424   BILIRUBINUR Negative 04/29/2016 1546   KETONESUR NEGATIVE 05/18/2022 1424   PROTEINUR NEGATIVE 03/05/2021 1046   UROBILINOGEN 0.2  05/18/2022 1424   NITRITE POSITIVE (A) 05/18/2022 1424   LEUKOCYTESUR LARGE (A) 05/18/2022 1424   Sepsis Labs Recent Labs  Lab 08/15/22 1546 08/15/22 2146 08/18/22 0602 08/19/22 0540  WBC 8.3 9.1 11.0* 8.7   Microbiology Recent Results (from the past 240 hour(s))  Expectorated Sputum Assessment w Gram Stain, Rflx to Resp Cult     Status: None   Collection Time: 08/11/22  2:51 PM   Specimen: Sputum  Result Value Ref Range Status   Specimen Description SPUTUM  Final   Special Requests NONE  Final   Sputum evaluation   Final    THIS SPECIMEN IS ACCEPTABLE FOR SPUTUM CULTURE Performed at [facility], [address] [place], [place] [address]    Report Status 08/11/2022 FINAL  Final  Culture, Respiratory w Gram Stain     Status: None   Collection Time: 08/11/22  2:51 PM   Specimen: SPU  Result Value Ref Range Status   Specimen Description   Final    SPUTUM Performed at [facility], [address], [place], [place] [address]    Special Requests   Final    NONE Reflexed from [phone] Performed at [facility], [address] [place], [place] [address]    Gram  Stain   Final    RARE WBC PRESENT, PREDOMINANTLY PMN RARE GRAM POSITIVE COCCI IN PAIRS RARE GRAM NEGATIVE RODS    Culture   Final    FEW Normal respiratory flora-no Staph aureus or Pseudomonas seen Performed at [facility], [address], [place], [place] [address]    Report Status 08/14/2022 FINAL  Final  Culture, blood (routine x 2) Call MD if unable to obtain prior to antibiotics being given     Status: None   Collection Time: 08/11/22  3:32 PM   Specimen: BLOOD  Result Value Ref Range Status   Specimen Description BLOOD RIGHT ANTECUBITAL  Final   Special Requests   Final    BOTTLES DRAWN AEROBIC AND ANAEROBIC Blood Culture adequate volume   Culture   Final    NO GROWTH 5 DAYS Performed at [facility], [address] [place], [place] [address]    Report Status 08/16/2022 FINAL  Final  Culture, blood (routine x 2) Call MD if unable to obtain prior to antibiotics being given     Status: None   Collection Time: 08/11/22  3:37 PM   Specimen: BLOOD  Result Value Ref Range Status   Specimen Description BLOOD LEFT ANTECUBITAL  Final   Special Requests   Final    BOTTLES DRAWN AEROBIC AND ANAEROBIC Blood Culture results may not be optimal due to an inadequate volume of blood received in culture bottles   Culture   Final    NO GROWTH 5 DAYS Performed at [facility] [address] [place], [place] [address]    Report Status 08/16/2022 FINAL  Final  Respiratory (~20 pathogens) panel by PCR     Status: Abnormal   Collection Time: 08/15/22  9:26 PM   Specimen: Nasopharyngeal Swab; Respiratory  Result Value Ref Range Status   Adenovirus NOT DETECTED NOT DETECTED Final   Coronavirus 229E NOT DETECTED NOT DETECTED Final    Comment: (NOTE) The Coronavirus on the Respiratory Panel, DOES NOT test for the novel  Coronavirus (2019 nCoV)    Coronavirus HKU1 NOT DETECTED NOT DETECTED Final   Coronavirus NL63 NOT DETECTED NOT DETECTED Final   Coronavirus OC43  NOT DETECTED  NOT DETECTED Final   Metapneumovirus DETECTED (A) NOT DETECTED Final   Rhinovirus / Enterovirus NOT DETECTED NOT DETECTED Final   Influenza A NOT DETECTED NOT DETECTED Final   Influenza B NOT DETECTED NOT DETECTED Final   Parainfluenza Virus 1 NOT DETECTED NOT DETECTED Final   Parainfluenza Virus 2 NOT DETECTED NOT DETECTED Final   Parainfluenza Virus 3 NOT DETECTED NOT DETECTED Final   Parainfluenza Virus 4 NOT DETECTED NOT DETECTED Final   Respiratory Syncytial Virus NOT DETECTED NOT DETECTED Final   Bordetella pertussis NOT DETECTED NOT DETECTED Final   Bordetella Parapertussis NOT DETECTED NOT DETECTED Final   Chlamydophila pneumoniae NOT DETECTED NOT DETECTED Final   Mycoplasma pneumoniae NOT DETECTED NOT DETECTED Final    Comment: Performed at [facility], [address] [place], [place] [address]  MRSA Next Gen by PCR, Nasal     Status: None   Collection Time: 08/15/22  9:26 PM   Specimen: Nasopharyngeal Swab; Nasal Swab  Result Value Ref Range Status   MRSA by PCR Next Gen NOT DETECTED NOT DETECTED Final    Comment: (NOTE) The GeneXpert MRSA Assay (FDA approved for NASAL specimens only), is one component of a comprehensive MRSA colonization surveillance program. It is not intended to diagnose MRSA infection nor to guide or monitor treatment for MRSA infections. Test performance is not FDA approved in patients less than 39 years old. Performed at [facility], [address] [place], [place] [address]   Culture, blood (Routine X 2) w Reflex to ID Panel     Status: None (Preliminary result)   Collection Time: 08/15/22  9:46 PM   Specimen: BLOOD  Result Value Ref Range Status   Specimen Description BLOOD LEFT ASSIST CONTROL  Final   Special Requests   Final    BOTTLES DRAWN AEROBIC AND ANAEROBIC Blood Culture results may not be optimal due to an excessive volume of blood received in culture bottles   Culture   Final    NO GROWTH 4  DAYS Performed at [facility], [address] [place], [place] [address]    Report Status PENDING  Incomplete  Culture, blood (Routine X 2) w Reflex to ID Panel     Status: None (Preliminary result)   Collection Time: 08/15/22  9:46 PM   Specimen: BLOOD  Result Value Ref Range Status   Specimen Description BLOOD RIGHT HAND  Final   Special Requests   Final    BOTTLES DRAWN AEROBIC AND ANAEROBIC Blood Culture results may not be optimal due to an excessive volume of blood received in culture bottles   Culture   Final    NO GROWTH 4 DAYS Performed at [facility] [address], [place], [place] [address]    Report Status PENDING  Incomplete  Expectorated Sputum Assessment w Gram Stain, Rflx to Resp Cult     Status: None   Collection Time: 08/16/22 10:14 PM   Specimen: Sputum  Result Value Ref Range Status   Specimen Description SPUTUM EXPSU, IMMNOCOMPROMISED  Final   Special Requests Immunocompromised  Final   Sputum evaluation   Final    Sputum specimen not acceptable for testing.  Please recollect.   C/ [facility] @0024  08/17/22 ASW Performed at [facility], [address] [place], [place] [address]    Report Status 08/17/2022 FINAL  Final     Total time spend on discharging this patient, including the last patient exam, discussing the hospital stay,  instructions for ongoing care as it relates to all pertinent caregivers, as well as preparing the medical discharge records, prescriptions, and/or referrals as applicable, is 35 minutes.    Darlin Priestly, MD  [facility] 08/19/2022, 7:43 AM

## 2022-08-19 NOTE — Telephone Encounter (Signed)
Who do I send this to so the TCM can be done

## 2022-08-19 NOTE — Care Management Important Message (Signed)
Important Message  Patient Details  Name: Sarah [name] MRN: [id number] Date of Birth: [birth year]   Medicare Important Message Given:  Yes  Patient is in an isolation room so I called [phone]) and reviewed her Important Message from Medicare by phone with her. She stated she understood her rights and discharge was planned for today. I wished her well and thanked her for her time.   Olegario Messier A Myli Pae 08/19/2022, 10:22 AM

## 2022-08-19 NOTE — Telephone Encounter (Signed)
Tussionex sent to [facility] for use if otc delsym not strong enough

## 2022-08-19 NOTE — Consult Note (Signed)
   [facility] CM Inpatient Consult   08/19/2022  Sarah [name] 10-Aug-1950 [id number]     Location: [facility] RN Hospital Liaison Screened remotely [facility]).   [facility] Customer service manager [facility]) Accountable Care Organization [ACO] Patient: Insurance [facility])    Primary Care Provider:  Sherlene Shams, MD [place] [facility]   Patient screened for 7 and 30  days readmission hospitalization with noted high risk score for unplanned readmission risk with 2 IP in 6 months. [facility]/Population Health RN liaison will assess for potential [facility]) Care Management service needs for post hospital transition for care coordination.  Will referred for care management services. Unsuccessful with several outreach attempts post hospital discharge.    Plan: [facility]/Population Health RN Liaison will continue to follow ongoing disposition in assessing for post hospital community care coordination/management needs.  Referral request for community care coordination:  Readmission prevention hospitalization and disease management with [facility] RN care coordinator.   [facility] Care Management/Population Health does not replace or interfere with any arrangements made by the Inpatient Transition of Care team.   For questions contact:   Elliot Cousin, RN, BSN [facility] Liaison [place]   [facility]  Population Health Office Hours MTWF 8:00 am to 6 pm off on Thursday [phone] mobile [phone] [Office toll free line][facility] Office Hours are M-F 8:30 - 5 pm 24 hour nurse advise line [phone] Conceirge  [email or web address]

## 2022-08-19 NOTE — Transitions of Care (Post Inpatient/ED Visit) (Signed)
Pt needs prescription for cough medicine.  She is using inhalers but I can hear her wheezing over the phone.     08/19/2022  Name: Sarah [name] MRN: [id number] DOB: [birth year]  Today's TOC FU Call Status: TOC FU Call Complete Date: 08/19/22  Transition Care Management Follow-up Telephone Call Date of Discharge: 08/15/22 Discharge Facility: [facility]) Type of Discharge: Inpatient Admission Primary Inpatient Discharge Diagnosis:: COPD How have you been since you were released from the hospital?: Same Any questions or concerns?: Yes Patient Questions/Concerns:: Needs cough medication Patient Questions/Concerns Addressed: Notified Provider of Patient Questions/Concerns  Items Reviewed: Did you receive and understand the discharge instructions provided?: Yes Medications obtained and verified?:  (No new medications) Any new allergies since your discharge?: No Dietary orders reviewed?: NA Do you have support at home?: Yes People in Home: alone Name of Support/Comfort Primary Source: Pt Staying with Tommye Standard a friend  Home Care and Equipment/Supplies: Were Home Health Services Ordered?: No Any new equipment or medical supplies ordered?: No  Functional Questionnaire: Do you need assistance with bathing/showering or dressing?: No Do you need assistance with meal preparation?: No Do you need assistance with eating?: No Do you have difficulty maintaining continence: No Do you need assistance with getting out of bed/getting out of a chair/moving?: No Do you have difficulty managing or taking your medications?: No  Follow up appointments reviewed: PCP Follow-up appointment confirmed?: Yes Date of PCP follow-up appointment?: 08/22/22 Follow-up Provider: Dr. Darrick Huntsman Specialist [facility] Follow-up appointment confirmed?: NA Do you need transportation to your follow-up appointment?: No Do you understand care options if your condition(s) worsen?: Yes-patient  verbalized understanding    SIGNATURE Valentino Nose, RN

## 2022-08-19 NOTE — Telephone Encounter (Addendum)
Pt called stating she is being released today from the hospital after having a viral and bacteria infection. Pt want to know if she need to make an appointment with provider. Pt would like cough medicine called in

## 2022-08-20 LAB — CULTURE, BLOOD (ROUTINE X 2)

## 2022-08-22 ENCOUNTER — Other Ambulatory Visit: Payer: Self-pay

## 2022-08-22 ENCOUNTER — Encounter: Payer: Self-pay | Admitting: Internal Medicine

## 2022-08-22 ENCOUNTER — Ambulatory Visit (INDEPENDENT_AMBULATORY_CARE_PROVIDER_SITE_OTHER): Payer: Medicare Other | Admitting: Internal Medicine

## 2022-08-22 VITALS — BP 124/58 | HR 85 | Temp 98.1°F | Ht 63.0 in | Wt 176.2 lb

## 2022-08-22 DIAGNOSIS — G4710 Hypersomnia, unspecified: Secondary | ICD-10-CM | POA: Diagnosis not present

## 2022-08-22 DIAGNOSIS — Z09 Encounter for follow-up examination after completed treatment for conditions other than malignant neoplasm: Secondary | ICD-10-CM | POA: Diagnosis not present

## 2022-08-22 DIAGNOSIS — R0603 Acute respiratory distress: Secondary | ICD-10-CM | POA: Diagnosis not present

## 2022-08-22 DIAGNOSIS — R0602 Shortness of breath: Secondary | ICD-10-CM

## 2022-08-22 DIAGNOSIS — I119 Hypertensive heart disease without heart failure: Secondary | ICD-10-CM

## 2022-08-22 DIAGNOSIS — D46Z Other myelodysplastic syndromes: Secondary | ICD-10-CM | POA: Diagnosis not present

## 2022-08-22 DIAGNOSIS — G959 Disease of spinal cord, unspecified: Secondary | ICD-10-CM

## 2022-08-22 MED ORDER — CHERATUSSIN AC 100-10 MG/5ML PO SOLN: 5.0000 mL | Freq: Three times a day (TID) | ORAL | 0 refills | Status: DC | PRN

## 2022-08-22 NOTE — Assessment & Plan Note (Signed)
With mid drop in hemoglobin noted over the last month , Managed by Oncology,  next appt August

## 2022-08-22 NOTE — Assessment & Plan Note (Signed)
Referring to cardiology for evaluation of heart

## 2022-08-22 NOTE — Progress Notes (Unsigned)
Subjective:  Patient ID: Sarah [name], female    DOB: [birth year]  Age: 72 y.o. MRN: [id number]  CC: There were no encounter diagnoses.   HPI Sarah [name] presents for  Chief Complaint  Patient presents with   Hospitalization Follow-up   Patient  is a   72 YR OLD FEMALE WITH A HISTORY OF MILD ASTHMA combined with restrictive lung disease secondary to obesity (managed by Pulmonology),  work up in progress for OSA,  who was seen on April 22 following discharge from [facility]  on April 20 for acute respiratory failure and was sent back to ER for ongoing respiratory failure /increased work of breathing without hypoxia .  She was readmitted On April 22 and discharged on April 26 in improved condition . Patient states that the hospitalist was rude and did not examine her or listen to her lungs on the day of discharge,  but noted in her discharge summary that she was not hypoxic   CT chest was done  and noted "Cardiomegaly, small pericardial effusion; Coronary artery disease, and Vague scattered ground-glass tree-in-bud nodular densities within the right upper lobe. This likely reflects small airways disease/alveolitis/bronchiolitis".  She feels better today,  Patient states that she had a "bad day" yesterday but felt better after taking 2 iron tablets   Moderate sleep apnea by April 9 study     Outpatient Medications Prior to Visit  Medication Sig Dispense Refill   albuterol (VENTOLIN HFA) 108 (90 Base) MCG/ACT inhaler Inhale 1-2 puffs into the lungs every 4 (four) hours as needed for wheezing or shortness of breath. 8 g 0   amLODipine (NORVASC) 5 MG tablet TAKE 1 TABLET BY MOUTH DAILY 100 tablet 2   Calcium Citrate-Vitamin D (CALCIUM CITRATE + D PO) Take 1 tablet by mouth 2 (two) times daily.     citalopram (CELEXA) 20 MG tablet TAKE 1 TABLET BY MOUTH DAILY 90 tablet 3   dextromethorphan-guaiFENesin (MUCINEX DM) 30-600 MG 12hr tablet Take 1 tablet by mouth 2 (two) times daily as needed for  cough. 30 tablet 0   famotidine (PEPCID) 20 MG tablet TAKE 1 TABLET BY MOUTH DAILY. BEFORE DINNER 30 tablet 11   ferrous sulfate 324 MG TBEC Take 324 mg by mouth. 65 mg of elemental iron     fexofenadine (ALLEGRA) 180 MG tablet Take 180 mg by mouth daily.     Fluticasone-Umeclidin-Vilant (TRELEGY ELLIPTA) 100-62.5-25 MCG/ACT AEPB Inhale 1 puff into the lungs daily. 1 each 11   gabapentin (NEURONTIN) 100 MG capsule Take 3 capsules (300 mg total) by mouth at bedtime. Home med.     levothyroxine (SYNTHROID) 50 MCG tablet TAKE 1 TABLET BY MOUTH DAILY ON  EMPTY STOMACH WITH A GLASS OF  WATER AT LEAST 30 TO 60 MIN  BEFORE BREAKFAST 100 tablet 2   pantoprazole (PROTONIX) 40 MG tablet TAKE 1 TABLET BY MOUTH DAILY 100 tablet 2   rosuvastatin (CRESTOR) 10 MG tablet Take 1 tablet (10 mg total) by mouth at bedtime. Home med.     solifenacin (VESICARE) 5 MG tablet TAKE ONE TABLET BY MOUTH EVERY DAY 30 tablet 2   traZODone (DESYREL) 50 MG tablet TAKE 1/2 TO 1 TABLET BY MOUTH AT BEDTIME AS NEEDED FOR SLEEP 90 tablet 3   vitamin B-12 (CYANOCOBALAMIN) 1000 MCG tablet Take 1,000 mcg by mouth daily.     chlorpheniramine-HYDROcodone (TUSSIONEX) 10-8 MG/5ML Take 5 mLs by mouth every 12 (twelve) hours as needed for cough. (Patient not taking: Reported  on 08/22/2022) 140 mL 0   No facility-administered medications prior to visit.    Review of Systems;  Patient denies headache, fevers, malaise, unintentional weight loss, skin rash, eye pain, sinus congestion and sinus pain, sore throat, dysphagia,  hemoptysis , cough, dyspnea, wheezing, chest pain, palpitations, orthopnea, edema, abdominal pain, nausea, melena, diarrhea, constipation, flank pain, dysuria, hematuria, urinary  Frequency, nocturia, numbness, tingling, seizures,  Focal weakness, Loss of consciousness,  Tremor, insomnia, depression, anxiety, and suicidal ideation.      Objective:  BP (!) 124/58   Pulse 85   Temp 98.1 F (36.7 C) (Oral)   Ht 5\' 3"  (1.6  m)   Wt 176 lb 3.2 oz (79.9 kg)   SpO2 96%   BMI 31.21 kg/m   BP Readings from Last 3 Encounters:  08/22/22 (!) 124/58  08/19/22 (!) 123/51  08/15/22 128/64    Wt Readings from Last 3 Encounters:  08/22/22 176 lb 3.2 oz (79.9 kg)  08/15/22 169 lb 8.5 oz (76.9 kg)  08/15/22 172 lb 9.6 oz (78.3 kg)    Physical Exam Vitals reviewed.  Constitutional:      General: She is not in acute distress.    Appearance: Normal appearance. She is normal weight. She is not ill-appearing, toxic-appearing or diaphoretic.  HENT:     Head: Normocephalic.  Eyes:     General: No scleral icterus.       Right eye: No discharge.        Left eye: No discharge.     Conjunctiva/sclera: Conjunctivae normal.  Cardiovascular:     Rate and Rhythm: Normal rate and regular rhythm.     Heart sounds: Normal heart sounds.  Pulmonary:     Effort: Pulmonary effort is normal. No respiratory distress.     Breath sounds: Normal breath sounds.  Musculoskeletal:        General: Normal range of motion.  Skin:    General: Skin is warm and dry.  Neurological:     General: No focal deficit present.     Mental Status: She is alert and oriented to person, place, and time. Mental status is at baseline.  Psychiatric:        Mood and Affect: Mood normal.        Behavior: Behavior normal.        Thought Content: Thought content normal.        Judgment: Judgment normal.     Lab Results  Component Value Date   HGBA1C 6.3 (H) 08/15/2022   HGBA1C 6.1 09/15/2021   HGBA1C 6.0 06/07/2021    Lab Results  Component Value Date   CREATININE 0.71 08/19/2022   CREATININE 0.71 08/18/2022   CREATININE 0.68 08/16/2022    Lab Results  Component Value Date   WBC 8.7 08/19/2022   HGB 9.1 (L) 08/19/2022   HCT 28.1 (L) 08/19/2022   PLT 105 (L) 08/19/2022   GLUCOSE 120 (H) 08/19/2022   CHOL 147 09/15/2021   TRIG 59.0 09/15/2021   HDL 64.20 09/15/2021   LDLCALC 71 09/15/2021   ALT 33 08/16/2022   AST 22 08/16/2022    NA 139 08/19/2022   K 3.5 08/19/2022   CL 105 08/19/2022   CREATININE 0.71 08/19/2022   BUN 27 (H) 08/19/2022   CO2 30 08/19/2022   TSH 1.47 09/15/2021   INR 1.1 08/16/2022   HGBA1C 6.3 (H) 08/15/2022   MICROALBUR <0.7 06/07/2021    CT CHEST WO CONTRAST  Result Date: 08/15/2022 CLINICAL DATA:  Recent pneumonia.  Worsening shortness of breath. EXAM: CT CHEST WITHOUT CONTRAST TECHNIQUE: Multidetector CT imaging of the chest was performed following the standard protocol without IV contrast. RADIATION DOSE REDUCTION: This exam was performed according to the departmental dose-optimization program which includes automated exposure control, adjustment of the mA and/or kV according to patient size and/or use of iterative reconstruction technique. COMPARISON:  Chest x-ray today. FINDINGS: Cardiovascular: Cardiomegaly. Small pericardial effusion. Scattered coronary artery and aortic calcifications. No aneurysm. Mediastinum/Nodes: Scattered borderline sized mediastinal lymph nodes. AP window lymph node has a short axis diameter of 7 mm. Other similarly sized mediastinal lymph nodes. None pathologically enlarged. No axillary adenopathy. Lungs/Pleura: Patchy ground-glass tree-in-bud nodular densities seen within the right upper lobe. No confluent opacities or effusions. Upper Abdomen: No acute findings Musculoskeletal: Chest wall soft tissues are unremarkable. No acute bony abnormality. IMPRESSION: Cardiomegaly, small pericardial effusion. Coronary artery disease. Vague scattered ground-glass tree-in-bud nodular densities within the right upper lobe. This likely reflects small airways disease/alveolitis/bronchiolitis. Aortic Atherosclerosis (ICD10-I70.0). Electronically Signed   By: Charlett Nose M.D.   On: 08/15/2022 21:22   DG Chest 2 View  Result Date: 08/15/2022 CLINICAL DATA:  Shortness of breath. EXAM: CHEST - 2 VIEW COMPARISON:  08/11/2022. FINDINGS: Improved aeration of both lungs. No consolidation or  pulmonary edema. Stable cardiac and mediastinal contours. No pleural effusion or pneumothorax. IMPRESSION: No evidence of acute cardiopulmonary disease. Electronically Signed   By: Orvan Falconer M.D.   On: 08/15/2022 16:25    Assessment & Plan:  .There are no diagnoses linked to this encounter.   I provided 30 minutes of face-to-face time during this encounter reviewing patient's last visit with me, patient's  most recent visit with cardiology,  nephrology,  and neurology,  recent surgical and non surgical procedures, previous  labs and imaging studies, counseling on currently addressed issues,  and post visit ordering to diagnostics and therapeutics .   Follow-up: No follow-ups on file.   Sherlene Shams, MD

## 2022-08-22 NOTE — Patient Instructions (Signed)
Please schedule a follow up with Dr Jayme Cloud at your earliest convenience  Please schedule a follow up with Tammy Parrett to address your sleep apnea   I have made a referral to Cardiology to have your heart evaluated again    An alternative cough medication  called cheratussin has been sent to your pharmacy

## 2022-08-22 NOTE — Assessment & Plan Note (Signed)
Secondary to moderate OSA diagnosed by April 2024 home sleep study .  Reminded to follow up with Tammy parrett to arrange treatment /CPAP.  Encouraged her to lose weight as well

## 2022-08-23 ENCOUNTER — Ambulatory Visit
Admission: RE | Admit: 2022-08-23 | Discharge: 2022-08-23 | Disposition: A | Discharge status: Home / Self Care | Payer: Medicare Other | Attending: Neurosurgery | Admitting: Neurosurgery

## 2022-08-23 ENCOUNTER — Ambulatory Visit: Payer: Medicare Other | Admitting: Neurosurgery

## 2022-08-23 ENCOUNTER — Encounter: Payer: Self-pay | Admitting: Neurosurgery

## 2022-08-23 ENCOUNTER — Ambulatory Visit
Admission: RE | Admit: 2022-08-23 | Discharge: 2022-08-23 | Disposition: A | Discharge status: Home / Self Care | Payer: Medicare Other | Source: Ambulatory Visit | Attending: Neurosurgery | Admitting: Neurosurgery

## 2022-08-23 VITALS — BP 122/64 | HR 76 | Ht 63.0 in | Wt 176.0 lb

## 2022-08-23 DIAGNOSIS — Z09 Encounter for follow-up examination after completed treatment for conditions other than malignant neoplasm: Secondary | ICD-10-CM | POA: Diagnosis not present

## 2022-08-23 DIAGNOSIS — G959 Disease of spinal cord, unspecified: Secondary | ICD-10-CM

## 2022-08-23 DIAGNOSIS — Z981 Arthrodesis status: Secondary | ICD-10-CM | POA: Diagnosis not present

## 2022-08-23 NOTE — Progress Notes (Signed)
   REFERRING PHYSICIAN:  Sherlene Shams, Md [address] [place],  [place] [address]  DOS: 11/10/21 C3-7 ACDF   HISTORY OF PRESENT ILLNESS: Sarah [name] is status post ACDF.  She is doing very well.  She has minimal pain.      PHYSICAL EXAMINATION:  NEUROLOGICAL:  General: In no acute distress.   Awake, alert, oriented to person, place, and time.  Pupils equal round and reactive to light.  Facial tone is symmetric.   Strength: Side Biceps Triceps Deltoid Interossei Grip Wrist Ext. Wrist Flex.  R 5 5 5 5 5 5 5   L 5 5 5 5 5 5 5    Incision c/d/I and healing well   Imaging:  Expected settling at C3-4.  Appears well-healed  Assessment / Plan: Sarah [name] is doing well after C3-7 ACDF.   I am pleased with her progress.    I will see her back as needed  Venetia Night MD Dept of Neurosurgery

## 2022-08-23 NOTE — Progress Notes (Signed)
Called and spoke with patient, advised of results/recommendations and scheduled f/u.

## 2022-08-23 NOTE — Assessment & Plan Note (Signed)
Patient is stable post discharge and has no new issues or questions about discharge plans at the visit today for hospital follow up. All labs , imaging studies and progress notes from admission were reviewed with patient today   

## 2022-08-23 NOTE — Assessment & Plan Note (Signed)
Readmission for continued respiratory distress without hypoxia was reviewed.  She was not seen by cardiology or pulmonology , for unclear reasons,  and per patient was treated rather rudely by the hospitalist and not examined prior to discharge. Imaging noted bronchiolitis.  Referring  to pulmonology and cardiology for reassessment

## 2022-08-23 NOTE — Telephone Encounter (Signed)
Pt had an appt yesterday and stated that she never picked up the rx.

## 2022-08-24 ENCOUNTER — Ambulatory Visit (INDEPENDENT_AMBULATORY_CARE_PROVIDER_SITE_OTHER): Payer: Medicare Other | Admitting: Pulmonary Disease

## 2022-08-24 ENCOUNTER — Encounter: Payer: Self-pay | Admitting: Pulmonary Disease

## 2022-08-24 VITALS — BP 118/70 | HR 68 | Temp 97.7°F | Ht 63.0 in | Wt 176.0 lb

## 2022-08-24 DIAGNOSIS — I3139 Other pericardial effusion (noninflammatory): Secondary | ICD-10-CM | POA: Diagnosis not present

## 2022-08-24 DIAGNOSIS — R0600 Dyspnea, unspecified: Secondary | ICD-10-CM | POA: Diagnosis not present

## 2022-08-24 DIAGNOSIS — J189 Pneumonia, unspecified organism: Secondary | ICD-10-CM | POA: Diagnosis not present

## 2022-08-24 DIAGNOSIS — J4540 Moderate persistent asthma, uncomplicated: Secondary | ICD-10-CM

## 2022-08-24 MED ORDER — TRELEGY ELLIPTA 100-62.5-25 MCG/ACT IN AEPB: 1.0000 | INHALATION_SPRAY | Freq: Every day | RESPIRATORY_TRACT | 0 refills | Status: DC

## 2022-08-24 NOTE — Patient Instructions (Signed)
Continue using your Trelegy, we provided you with some samples.  I am going to get a study of your heart to check for fluid around the heart test this was noted on the CT you had in the hospital.  We will see you in follow-up in 4 to 6 weeks time call sooner should any new problems arise.

## 2022-08-24 NOTE — Progress Notes (Signed)
Subjective:    Patient ID: Sarah [name], female    DOB: 23-Jun-1950, 72 y.o.   MRN: [id number] Patient Care Team: Sherlene Shams, MD as PCP - General (Internal Medicine) Sherlene Shams, MD (Internal Medicine) Lemar Livings Merrily Pew, MD (General Surgery) Earna Coder, MD as Consulting Physician (Hematology and Oncology)  Chief Complaint  Patient presents with   Follow-up    No SOB or wheezing. Cough with green sputum. ED for Pneumonia and COPD Exacerbation.   HPI Patient is a 72 year old lifelong never smoker with a history of moderate persistent asthma who presents today as a posthospital visit.  I had not seen the patient since 19 May 2020 as she had been lost to follow-up.  She has been maintained on Trelegy Ellipta 100, 1 inhalation daily for her asthma.  She has been getting refills through primary care.  The patient was admitted to [facility] on 18 April through 20 April for community-acquired pneumonia.  She met sepsis criteria at that time.  She was treated with ceftriaxone and doxycycline eventually transition to Augmentin plus azithromycin to complete the course.  She was also given a prednisone taper.  She states that she was discharged before she was ready and was readmitted on 22 April with increasing shortness of breath.  At that time she tested positive for metapneumovirus.  She was treated with supportive care and was discharged on 19 August 2022.  She received steroids during that admission but was not discharged on any steroids.  No further antibiotic therapy was needed.  Since her discharge she has not had any wheezing.  She has had some dyspnea but notices gradual improvement.  Had some productive cough but this is improving.  Sputum is yellowish.  No hemoptysis.  No chest pain, no orthopnea or paroxysmal nocturnal dyspnea.  No lower extremity edema nor calf tenderness.  She notes that Trelegy Ellipta does help her.  She uses albuterol rescue 2-3 times per week.  She had a  CT chest without contrast on 22 April that showed scattered groundglass tree-in-bud nodular densities within the right upper lobe with alveolitis/bronchiolitis/small airways disease.  She also had cardiomegaly and a pericardial effusion noted.  She has not had a 2D echo.   Review of Systems A 10 point review of systems was performed and it is as noted above otherwise negative.  Patient Active Problem List   Diagnosis Date Noted   Acute respiratory distress 08/15/2022   Hyperglycemia 08/15/2022   CAP (community acquired pneumonia) 08/11/2022   Snoring 06/03/2022   Hypersomnia 05/18/2022   Suprapubic cramping 05/18/2022   Overactive bladder 05/18/2022   MDS (myelodysplastic syndrome), low grade (HCC) 04/20/2022   Tubular adenoma of colon 02/07/2022   Dysuria 02/06/2022   Cervical myelopathy (HCC) 11/10/2021   Spinal stenosis, cervical region 09/15/2021   Adenomatous polyp of transverse colon    Polyp of colon    Thrombocytopenia (HCC) 01/13/2021   Essential hypertension 12/20/2020   Macrocytic anemia 12/18/2020   Allergic rhinitis 12/13/2020   Fatigue 12/13/2020   Insomnia 12/13/2020   Grief reaction 08/09/2020   Cognitive complaints 08/09/2020   Neuropathy 06/14/2020   Educated about COVID-19 virus infection 04/10/2019   Prediabetes 10/13/2018   LVH (left ventricular hypertrophy) due to hypertensive disease, without heart failure 10/13/2018   Hospital discharge follow-up 07/08/2018   Chest tightness 06/26/2018   Asthma exacerbation 06/04/2018   Hypothyroid 06/15/2017   Post herpetic neuralgia 06/06/2017   Encounter for preventive health examination 09/09/2014  Chronic GERD 09/09/2014   Hyperlipidemia 08/17/2014   S/P hysterectomy 04/28/2013   Initial Medicare annual wellness visit 04/28/2013   Depression    Social History   Tobacco Use   Smoking status: Never   Smokeless tobacco: Never  Substance Use Topics   Alcohol use: No   No Known Allergies  Current Meds   Medication Sig   albuterol (VENTOLIN HFA) 108 (90 Base) MCG/ACT inhaler Inhale 1-2 puffs into the lungs every 4 (four) hours as needed for wheezing or shortness of breath.   amLODipine (NORVASC) 5 MG tablet TAKE 1 TABLET BY MOUTH DAILY   Calcium Citrate-Vitamin D (CALCIUM CITRATE + D PO) Take 1 tablet by mouth 2 (two) times daily.   citalopram (CELEXA) 20 MG tablet TAKE 1 TABLET BY MOUTH DAILY   famotidine (PEPCID) 20 MG tablet TAKE 1 TABLET BY MOUTH DAILY. BEFORE DINNER   ferrous sulfate 324 MG TBEC Take 324 mg by mouth. 65 mg of elemental iron   fexofenadine (ALLEGRA) 180 MG tablet Take 180 mg by mouth daily.   Fluticasone-Umeclidin-Vilant (TRELEGY ELLIPTA) 100-62.5-25 MCG/ACT AEPB Inhale 1 puff into the lungs daily.   gabapentin (NEURONTIN) 100 MG capsule Take 3 capsules (300 mg total) by mouth at bedtime. Home med.   guaiFENesin-codeine (CHERATUSSIN AC) 100-10 MG/5ML syrup Take 5 mLs by mouth 3 (three) times daily as needed for cough.   levothyroxine (SYNTHROID) 50 MCG tablet TAKE 1 TABLET BY MOUTH DAILY ON  EMPTY STOMACH WITH A GLASS OF  WATER AT LEAST 30 TO 60 MIN  BEFORE BREAKFAST   pantoprazole (PROTONIX) 40 MG tablet TAKE 1 TABLET BY MOUTH DAILY   rosuvastatin (CRESTOR) 10 MG tablet Take 1 tablet (10 mg total) by mouth at bedtime. Home med.   solifenacin (VESICARE) 5 MG tablet TAKE ONE TABLET BY MOUTH EVERY DAY   traZODone (DESYREL) 50 MG tablet TAKE 1/2 TO 1 TABLET BY MOUTH AT BEDTIME AS NEEDED FOR SLEEP   vitamin B-12 (CYANOCOBALAMIN) 1000 MCG tablet Take 1,000 mcg by mouth daily.   Immunization History  Administered Date(s) Administered   Fluad Quad(high Dose 65+) 12/06/2018   Influenza, High Dose Seasonal PF 04/22/2016, 06/05/2017   Influenza,inj,Quad PF,6+ Mos 04/26/2013, 01/30/2015   Influenza-Unspecified 01/24/2018, 12/06/2018, 01/02/2020, 04/02/2021, 02/03/2022   Moderna Covid-19 Vaccine Bivalent Booster 55yrs & up 02/05/2021, 02/03/2022   Moderna Sars-Covid-2 Vaccination  06/20/2019, 07/22/2019, 02/14/2020   PNEUMOCOCCAL CONJUGATE-20 11/09/2020   Pneumococcal Conjugate-13 09/01/2014, 01/24/2018   Pneumococcal Polysaccharide-23 04/26/2013, 02/28/2019   Rsv, Bivalent, Protein Subunit Rsvpref,pf Verdis Frederickson) 05/26/2022   Tdap 06/18/2014, 05/26/2022   Zoster Recombinat (Shingrix) 07/08/2017, 09/22/2017       Objective:   Physical Exam BP 118/70 (BP Location: Left Arm, Cuff Size: Normal)   Pulse 68   Temp 97.7 F (36.5 C)   Ht 5\' 3"  (1.6 m)   Wt 176 lb (79.8 kg)   SpO2 94%   BMI 31.18 kg/m   SpO2: 94 % O2 Device: None (Room air)  GENERAL: Overweight woman, no acute distress.  Fully ambulatory, no conversational dyspnea, somewhat pale. HEAD: Normocephalic, atraumatic.  EYES: Pupils equal, round, reactive to light.  No scleral icterus.  MOUTH: Natural dentition present, few chipped teeth.  Oral mucosa moist. NECK: Supple. No thyromegaly. Trachea midline. No JVD.  No adenopathy. PULMONARY: Lungs sound coarse but otherwise clear to auscultation bilaterally. CARDIOVASCULAR: S1 and S2. Regular rate and rhythm.  No rubs, murmurs gallops heard. GASTROINTESTINAL: Benign. MUSCULOSKELETAL: No joint deformity, no clubbing, no edema.  NEUROLOGIC: No  overt focal deficits. SKIN: Intact,warm,dry. PSYCH: Mood and behavior appropriate.       Assessment & Plan:     ICD-10-CM   1. Moderate persistent asthma without complication  J45.40    Appears compensated at present Continue Trelegy Ellipta 100 Continue as needed albuterol    2. Pericardial effusion  I31.39 ECHOCARDIOGRAM COMPLETE   Obtain 2D echo Query viral pericarditis    3. Community acquired pneumonia of right lung, unspecified part of lung  J18.9    Viral, metapneumovirus Completed therapies    4. Dyspnea, unspecified type  R06.00 ECHOCARDIOGRAM COMPLETE   Obtain 2D echo     Orders Placed This Encounter  Procedures   ECHOCARDIOGRAM COMPLETE    Standing Status:   Future    Standing  Expiration Date:   08/24/2023    Order Specific Question:   Where should this test be performed    Answer:   [facility]    Order Specific Question:   Please indicate who you request to read the nuc med / echo results.    Answer:   [facility]    Order Specific Question:   Perflutren DEFINITY (image enhancing agent) should be administered unless hypersensitivity or allergy exist    Answer:   Administer Perflutren    Order Specific Question:   Is a special reader required? (athlete or structural heart)    Answer:   No    Order Specific Question:   Does this study need to be read by the Structural team/Level 3 readers?    Answer:   No    Order Specific Question:   Reason for exam-Echo    Answer:   Dyspnea  R06.00    Order Specific Question:   Reason for exam-Echo    Answer:   Pericardial effusion I31.3   Meds ordered this encounter  Medications   Fluticasone-Umeclidin-Vilant (TRELEGY ELLIPTA) 100-62.5-25 MCG/ACT AEPB    Sig: Inhale 1 puff into the lungs daily.    Dispense:  28 each    Refill:  0    Order Specific Question:   Lot Number?    Answer:   [id number]    Order Specific Question:   Expiration Date?    Answer:   11/24/2023    Order Specific Question:   Quantity    Answer:   2   Patient has a follow-up with Rubye Oaks, NP on 14 May follow-up on sleep apnea.  She will follow-up with me 4 to 6 weeks.  Will notify her of the results of her echocardiogram.  C. Danice Goltz, MD Advanced Bronchoscopy PCCM [facility]    *This note was dictated using voice recognition software/Dragon.  Despite best efforts to proofread, errors can occur which can change the meaning. Any transcriptional errors that result from this process are unintentional and may not be fully corrected at the time of dictation.

## 2022-08-26 ENCOUNTER — Telehealth: Payer: Self-pay | Admitting: *Deleted

## 2022-08-26 NOTE — Progress Notes (Signed)
  Care Coordination   Note   08/26/2022 Name: KISHIA MATCHETT MRN: [id number] DOB: Dec 30, 1950  ZANETA ORENSTEIN is a 72 y.o. year old female who sees Darrick Huntsman, Mar Daring, MD for primary care. I reached out to [name] by phone today to offer care coordination services.  Ms. Nyholm was given information about Care Coordination services today including:   The Care Coordination services include support from the care team which includes your Nurse Coordinator, Clinical Social Worker, or Pharmacist.  The Care Coordination team is here to help remove barriers to the health concerns and goals most important to you. Care Coordination services are voluntary, and the patient may decline or stop services at any time by request to their care team member.   Care Coordination Consent Status: Patient agreed to services and verbal consent obtained.   Follow up plan:  Telephone appointment with care coordination team member scheduled for:  09/02/2022  Encounter Outcome:  Pt. Scheduled from referral   Burman Nieves, [facility] Care Coordination Care Guide Direct Dial: [phone]

## 2022-09-01 ENCOUNTER — Ambulatory Visit: Admission: RE | Admit: 2022-09-01 | Payer: Medicare Other | Source: Ambulatory Visit

## 2022-09-01 ENCOUNTER — Telehealth: Payer: Self-pay | Admitting: Pulmonary Disease

## 2022-09-01 NOTE — Telephone Encounter (Signed)
She has a pericardial effusion (fluid around the heart) I need to evaluate a pericardial effusion I cannot do it without an echo.

## 2022-09-01 NOTE — Telephone Encounter (Signed)
Dr. Jayme Cloud you placed an order for the patient to have echo it was scheduled on 09/01/22 she has [facility] and for the code 98119, [phone] PA not required. For the code 95621 they are wanting to know if you have any additional information other then your notes that I faxed to try and get the 3D part of the echo approved.  Would you be willing to do peer to peer?

## 2022-09-02 ENCOUNTER — Encounter: Payer: Self-pay | Admitting: *Deleted

## 2022-09-02 ENCOUNTER — Ambulatory Visit: Payer: Self-pay | Admitting: *Deleted

## 2022-09-02 NOTE — Telephone Encounter (Signed)
Patient has been rescheduled to do echo on Oct 21, 2022 for the codes 16109, C8929 PA not required Refer # [id number]. Per Dr. Jayme Cloud she doesn't need the code 91478 3D "She has a pericardial effusion (fluid around the heart) I need to evaluate a pericardial effusion I cannot do it without an echo".  I have called the [employer] and withdrawn that code Refer # [id number] and spoke with August Saucer Cardiology nurse

## 2022-09-02 NOTE — Patient Outreach (Signed)
  Care Coordination   Initial Visit Note   09/02/2022 Name: Sarah [name] MRN: [id number] DOB: 12-30-1950  Sarah [name] is a 72 y.o. year old female who sees Sarah [name], Sarah Daring, MD for primary care. I spoke with  Sarah [name] by phone today.  What matters to the patients health and wellness today?  Admitted to hospital twice since April for Asthma/COPD issues.  Working to decrease readmission     Goals Addressed             This Visit's Progress    Effective management of COPD       Care Coordination Interventions: Provided patient with basic written and verbal COPD education on self care/management/and exacerbation prevention Advised patient to track and manage COPD triggers Provided written and verbal instructions on pursed lip breathing and utilized returned demonstration as teach back Advised patient to self assesses COPD action plan zone and make appointment with provider if in the yellow zone for 48 hours without improvement Provided education about and advised patient to utilize infection prevention strategies to reduce risk of respiratory infection Screening for signs and symptoms of depression related to chronic disease state  Assessed social determinant of health barriers         SDOH assessments and interventions completed:  Yes  SDOH Interventions Today    Flowsheet Row Most Recent Value  SDOH Interventions   Food Insecurity Interventions Intervention Not Indicated  Housing Interventions Intervention Not Indicated  Transportation Interventions Intervention Not Indicated        Care Coordination Interventions:  Yes, provided   Interventions Today    Flowsheet Row Most Recent Value  Chronic Disease   Chronic disease during today's visit Chronic Obstructive Pulmonary Disease (COPD)  General Interventions   General Interventions Discussed/Reviewed General Interventions Reviewed, Doctor Visits, Sick Day Rules  Doctor Visits Discussed/Reviewed Doctor  Visits Reviewed, PCP, Specialist  [Pulmonary on 5/14, will have repeat CXR prior to appointment.  Echo on 6/4, cardiology on 6/21]  PCP/Specialist Visits Compliance with follow-up visit  [Pulmonary done on 5/1]  Education Interventions   Education Provided Provided Education  Provided Verbal Education On Medication, When to see the doctor, Sick Day Rules, Other  [Still having productive cough (brown/yellow).  Monitoring oxygen levels, today 93%.  Taking Trellegy, does not complain of cost currently. Staying with friends until she has fully recovered]        Follow up plan: Follow up call scheduled for 6/5    Encounter Outcome:  Pt. Visit Completed   Sarah Durie, RN, MSN, [facility] Care Management Care Management Coordinator [phone]

## 2022-09-05 ENCOUNTER — Telehealth: Payer: Self-pay | Admitting: Neurosurgery

## 2022-09-05 MED ORDER — METHYLPREDNISOLONE 4 MG PO TBPK: ORAL_TABLET | ORAL | 0 refills | Status: DC

## 2022-09-05 NOTE — Telephone Encounter (Signed)
Patient seen 4/30, she called that she is having shooting pain where she had the fusion done.   Per Dr.Yarbrough she was just seen on 4/30, he would like for her to try steroids and if not better can schedule an appt to be seen.   Patient was given Dr.Yarbrough's recommendation and she agreed. Please send prescription to [facility].

## 2022-09-05 NOTE — Telephone Encounter (Signed)
Added you to the chat.

## 2022-09-05 NOTE — Telephone Encounter (Signed)
Patient aware of medication

## 2022-09-05 NOTE — Telephone Encounter (Signed)
Confirmed with Dr Myer Haff via secure chat that he would like her to try steroids (since he just saw her 08/23/22) and if that doesn't work or she develops new weakness, she can come back into clinic for evaluation. Rx sent.

## 2022-09-06 ENCOUNTER — Ambulatory Visit: Payer: Medicare Other | Admitting: Neurosurgery

## 2022-09-06 ENCOUNTER — Ambulatory Visit (INDEPENDENT_AMBULATORY_CARE_PROVIDER_SITE_OTHER): Payer: Medicare Other | Admitting: Adult Health

## 2022-09-06 ENCOUNTER — Encounter: Payer: Self-pay | Admitting: Adult Health

## 2022-09-06 VITALS — BP 128/58 | HR 81 | Ht 63.0 in | Wt 172.0 lb

## 2022-09-06 DIAGNOSIS — J189 Pneumonia, unspecified organism: Secondary | ICD-10-CM

## 2022-09-06 DIAGNOSIS — J4541 Moderate persistent asthma with (acute) exacerbation: Secondary | ICD-10-CM

## 2022-09-06 DIAGNOSIS — G4733 Obstructive sleep apnea (adult) (pediatric): Secondary | ICD-10-CM

## 2022-09-06 MED ORDER — TRELEGY ELLIPTA 100-62.5-25 MCG/ACT IN AEPB: 1.0000 | INHALATION_SPRAY | Freq: Every day | RESPIRATORY_TRACT | 0 refills | Status: DC

## 2022-09-06 NOTE — Addendum Note (Signed)
Addended by: Delrae Rend on: 09/06/2022 04:45 PM   Modules accepted: Orders

## 2022-09-06 NOTE — Progress Notes (Signed)
Reviewed and agree with assessment/plan.   Coralyn Helling, MD [facility] Pulmonary/Critical Care 09/06/2022, 5:22 PM Pager:  [phone]

## 2022-09-06 NOTE — Progress Notes (Signed)
Agree with the details of the visit as noted by Tammy Parrett, NP.  C. Laura Sanjay Broadfoot, MD [facility] 

## 2022-09-06 NOTE — Patient Instructions (Addendum)
Begin CPAP At bedtime, wear all night long for at least 6hr or more  Try Dream wear nasal mask  Work on healthy sleep regimen  Work on healthy weight loss  Do not drive if sleepy.   Continue on Trelegy 1 puff daily , rinse after use  Albuterol inhaler As needed   Echo as planned  Follow up in 3 months in  [place] and As needed

## 2022-09-06 NOTE — Progress Notes (Signed)
@Patient  ID: Sarah [name], female    DOB: 07/23/1950, 72 y.o.   MRN: [id number]  Chief Complaint  Patient presents with   Follow-up    Referring provider: Sherlene Shams, MD  HPI: 72 year old female never smoker followed for moderate persistent asthma-patient of Dr. Jayme Cloud Sleep consult June 03, 2022 for snoring and daytime sleepiness found to have severe sleep apnea  TEST/EVENTS :  Snap home sleep study done on July 19, 2022 showed severe sleep apnea with AHI at 35.9/hour and SpO2 low at 74%.,  50% was spent less than 88% O2 saturation.  Mean O2 saturation was 89%.  Central/mixed apnea index was 3.5/hour.  09/06/2022 Follow up : Obstructive sleep apnea Patient presents for a 30-month follow-up.  Patient was seen last visit for sleep consult for snoring and daytime sleepiness.  Patient was set up for home sleep study that was completed on July 19, 2022 that showed severe sleep apnea with AHI at 35.9/hour and SpO2 low at 74%.  50% of study was spent less than 88% O2 sat we reviewed her sleep study results in detail went over treatment options including weight loss and CPAP therapy.  Patient is willing to begin CPAP therapy.  Patient is followed for asthma.  She is maintained on Trelegy.  Patient was admitted last month for asthma exacerbation, metapneumovirus and community-acquired pneumonia.  She was treated with IV antibiotics and steroids and discharged on Augmentin and azithromycin.  CT chest showed groundglass tree-in-bud nodular densities in the right upper lobe and a small pericardial effusion.  Patient was set up for a follow-up 2D echo that is pending. Patient says she is feeling better but remains weak.  Slowly regaining her energy.  Cough and congestion have decreased.  No Known Allergies  Immunization History  Administered Date(s) Administered   Fluad Quad(high Dose 65+) 12/06/2018   Influenza, High Dose Seasonal PF 04/22/2016, 06/05/2017   Influenza,inj,Quad PF,6+  Mos 04/26/2013, 01/30/2015   Influenza-Unspecified 01/24/2018, 12/06/2018, 01/02/2020, 04/02/2021, 02/03/2022   Moderna Covid-19 Vaccine Bivalent Booster 85yrs & up 02/05/2021, 02/03/2022   Moderna Sars-Covid-2 Vaccination 06/20/2019, 07/22/2019, 02/14/2020   PNEUMOCOCCAL CONJUGATE-20 11/09/2020   Pneumococcal Conjugate-13 09/01/2014, 01/24/2018   Pneumococcal Polysaccharide-23 04/26/2013, 02/28/2019   Rsv, Bivalent, Protein Subunit Rsvpref,pf Verdis Frederickson) 05/26/2022   Tdap 06/18/2014, 05/26/2022   Zoster Recombinat (Shingrix) 07/08/2017, 09/22/2017    Past Medical History:  Diagnosis Date   Arthritis    Asthma    Depression    Dyspnea    with heat   Environmental and seasonal allergies    GERD (gastroesophageal reflux disease)    Hypertension    Hypothyroidism    Presence of dental prosthetic device    Dental implants   Wheezing     Tobacco History: Social History   Tobacco Use  Smoking Status Never  Smokeless Tobacco Never   Counseling given: Not Answered   Outpatient Medications Prior to Visit  Medication Sig Dispense Refill   albuterol (VENTOLIN HFA) 108 (90 Base) MCG/ACT inhaler Inhale 1-2 puffs into the lungs every 4 (four) hours as needed for wheezing or shortness of breath. 8 g 0   amLODipine (NORVASC) 5 MG tablet TAKE 1 TABLET BY MOUTH DAILY 100 tablet 2   Calcium Citrate-Vitamin D (CALCIUM CITRATE + D PO) Take 1 tablet by mouth 2 (two) times daily.     citalopram (CELEXA) 20 MG tablet TAKE 1 TABLET BY MOUTH DAILY 90 tablet 3   famotidine (PEPCID) 20 MG tablet TAKE 1 TABLET BY  MOUTH DAILY. BEFORE DINNER 30 tablet 11   ferrous sulfate 324 MG TBEC Take 324 mg by mouth. 65 mg of elemental iron     fexofenadine (ALLEGRA) 180 MG tablet Take 180 mg by mouth daily.     Fluticasone-Umeclidin-Vilant (TRELEGY ELLIPTA) 100-62.5-25 MCG/ACT AEPB Inhale 1 puff into the lungs daily. 1 each 11   gabapentin (NEURONTIN) 100 MG capsule Take 3 capsules (300 mg total) by mouth at  bedtime. Home med.     guaiFENesin-codeine (CHERATUSSIN AC) 100-10 MG/5ML syrup Take 5 mLs by mouth 3 (three) times daily as needed for cough. 120 mL 0   levothyroxine (SYNTHROID) 50 MCG tablet TAKE 1 TABLET BY MOUTH DAILY ON  EMPTY STOMACH WITH A GLASS OF  WATER AT LEAST 30 TO 60 MIN  BEFORE BREAKFAST 100 tablet 2   methylPREDNISolone (MEDROL DOSEPAK) 4 MG TBPK tablet Take by mouth daily - taper daily dose per package instructions. 21 tablet 0   pantoprazole (PROTONIX) 40 MG tablet TAKE 1 TABLET BY MOUTH DAILY 100 tablet 2   rosuvastatin (CRESTOR) 10 MG tablet Take 1 tablet (10 mg total) by mouth at bedtime. Home med.     solifenacin (VESICARE) 5 MG tablet TAKE ONE TABLET BY MOUTH EVERY DAY 30 tablet 2   traZODone (DESYREL) 50 MG tablet TAKE 1/2 TO 1 TABLET BY MOUTH AT BEDTIME AS NEEDED FOR SLEEP 90 tablet 3   vitamin B-12 (CYANOCOBALAMIN) 1000 MCG tablet Take 1,000 mcg by mouth daily.     Fluticasone-Umeclidin-Vilant (TRELEGY ELLIPTA) 100-62.5-25 MCG/ACT AEPB Inhale 1 puff into the lungs daily. (Patient not taking: Reported on 09/06/2022) 28 each 0   No facility-administered medications prior to visit.     Review of Systems:   Constitutional:   No  weight loss, night sweats,  Fevers, chills, fatigue, or  lassitude.  HEENT:   No headaches,  Difficulty swallowing,  Tooth/dental problems, or  Sore throat,                No sneezing, itching, ear ache, nasal congestion, post nasal drip,   CV:  No chest pain,  Orthopnea, PND, swelling in lower extremities, anasarca, dizziness, palpitations, syncope.   GI  No heartburn, indigestion, abdominal pain, nausea, vomiting, diarrhea, change in bowel habits, loss of appetite, bloody stools.   Resp: No shortness of breath with exertion or at rest.  No excess mucus, no productive cough,  No non-productive cough,  No coughing up of blood.  No change in color of mucus.  No wheezing.  No chest wall deformity  Skin: no rash or lesions.  GU: no dysuria,  change in color of urine, no urgency or frequency.  No flank pain, no hematuria   MS:  No joint pain or swelling.  No decreased range of motion.  No back pain.    Physical Exam  BP (!) 128/58 (BP Location: Left Arm)   Pulse 81   Ht 5\' 3"  (1.6 m)   Wt 172 lb (78 kg)   SpO2 95%   BMI 30.47 kg/m   GEN: A/Ox3; pleasant , NAD, well nourished    HEENT:  [place]/AT,  EACs-clear, TMs-wnl, NOSE-clear, THROAT-clear, no lesions, no postnasal drip or exudate noted. Class 3 MP airway   NECK:  Supple w/ fair ROM; no JVD; normal carotid impulses w/o bruits; no thyromegaly or nodules palpated; no lymphadenopathy.    RESP  Clear  P & A; w/o, wheezes/ rales/ or rhonchi. no accessory muscle use, no dullness to percussion  CARD:  RRR, no m/r/g, no peripheral edema, pulses intact, no cyanosis or clubbing.  GI:   Soft & nt; nml bowel sounds; no organomegaly or masses detected.   Musco: Warm bil, no deformities or joint swelling noted.   Neuro: alert, no focal deficits noted.    Skin: Warm, no lesions or rashes    Lab Results:  CBC   ProBNP No results found for: "PROBNP"  Imaging:     No data to display          No results found for: "NITRICOXIDE"      Assessment & Plan:   OSA (obstructive sleep apnea) Severe obstructive sleep apnea.  Patient education given on sleep apnea.  Patient did have a significant hypoxemia during sleep study.  Will begin CPAP therapy auto CPAP 5 to 15 cm H2O.  Patient says she cannot wear a fullface mask.  Will try the DreamWear nasal mask. On return, if doing well and compliant on CPAP.  Consider an overnight oximetry test versus CPAP titration if indicated.  Plan  Patient Instructions  Begin CPAP At bedtime, wear all night long for at least 6hr or more  Try Dream wear nasal mask  Work on healthy sleep regimen  Work on healthy weight loss  Do not drive if sleepy.   Continue on Trelegy 1 puff daily , rinse after use  Albuterol inhaler As needed    Echo as planned  Follow up in 3 months in  [place] and As needed      Asthma exacerbation Recent asthma exacerbation with hospitalization secondary to metapneumovirus and pneumonia.  Patient is clinically improving. Continue on current regimen Follow-up next month with Dr. Jayme Cloud as planned  Plan  Patient Instructions  Begin CPAP At bedtime, wear all night long for at least 6hr or more  Try Dream wear nasal mask  Work on healthy sleep regimen  Work on healthy weight loss  Do not drive if sleepy.   Continue on Trelegy 1 puff daily , rinse after use  Albuterol inhaler As needed   Echo as planned  Follow up in 3 months in  [place] and As needed       CAP (community acquired pneumonia) Clinically improving.  Has completed full course of antibiotics.  CT chest did show a small pericardial effusion.  2D echo is pending.     Rubye Oaks, NP 09/06/2022

## 2022-09-06 NOTE — Assessment & Plan Note (Signed)
Recent asthma exacerbation with hospitalization secondary to metapneumovirus and pneumonia.  Patient is clinically improving. Continue on current regimen Follow-up next month with Dr. Jayme Cloud as planned  Plan  Patient Instructions  Begin CPAP At bedtime, wear all night long for at least 6hr or more  Try Dream wear nasal mask  Work on healthy sleep regimen  Work on healthy weight loss  Do not drive if sleepy.   Continue on Trelegy 1 puff daily , rinse after use  Albuterol inhaler As needed   Echo as planned  Follow up in 3 months in  [place] and As needed

## 2022-09-06 NOTE — Assessment & Plan Note (Signed)
Severe obstructive sleep apnea.  Patient education given on sleep apnea.  Patient did have a significant hypoxemia during sleep study.  Will begin CPAP therapy auto CPAP 5 to 15 cm H2O.  Patient says she cannot wear a fullface mask.  Will try the DreamWear nasal mask. On return, if doing well and compliant on CPAP.  Consider an overnight oximetry test versus CPAP titration if indicated.  Plan  Patient Instructions  Begin CPAP At bedtime, wear all night long for at least 6hr or more  Try Dream wear nasal mask  Work on healthy sleep regimen  Work on healthy weight loss  Do not drive if sleepy.   Continue on Trelegy 1 puff daily , rinse after use  Albuterol inhaler As needed   Echo as planned  Follow up in 3 months in  [place] and As needed

## 2022-09-06 NOTE — Assessment & Plan Note (Signed)
Clinically improving.  Has completed full course of antibiotics.  CT chest did show a small pericardial effusion.  2D echo is pending.

## 2022-09-07 ENCOUNTER — Other Ambulatory Visit: Payer: Self-pay | Admitting: Internal Medicine

## 2022-09-07 MED ORDER — ALBUTEROL SULFATE HFA 108 (90 BASE) MCG/ACT IN AERS: 1.0000 | INHALATION_SPRAY | RESPIRATORY_TRACT | 3 refills | Status: DC | PRN

## 2022-09-07 NOTE — Addendum Note (Signed)
Addended by: Delrae Rend on: 09/07/2022 05:25 PM   Modules accepted: Orders

## 2022-09-09 ENCOUNTER — Other Ambulatory Visit: Payer: Self-pay | Admitting: Internal Medicine

## 2022-09-20 ENCOUNTER — Telehealth: Payer: Self-pay | Admitting: Neurosurgery

## 2022-09-20 DIAGNOSIS — G4733 Obstructive sleep apnea (adult) (pediatric): Secondary | ICD-10-CM | POA: Diagnosis not present

## 2022-09-20 MED ORDER — METHYLPREDNISOLONE 4 MG PO TBPK: ORAL_TABLET | ORAL | 0 refills | Status: DC

## 2022-09-20 NOTE — Telephone Encounter (Signed)
Patient has been notified that prescription has been sent to her pharmacy.

## 2022-09-20 NOTE — Telephone Encounter (Signed)
Refill sent to [facility]

## 2022-09-20 NOTE — Telephone Encounter (Signed)
Pt called in complaining of pain in the back of her head coming from the top of her neck. States that the prednisone that she was prescribed back on 09/05/2022 helped but she is in need of a refill as the pain has started to come back.

## 2022-09-23 DIAGNOSIS — M5451 Vertebrogenic low back pain: Secondary | ICD-10-CM | POA: Diagnosis not present

## 2022-09-23 DIAGNOSIS — M542 Cervicalgia: Secondary | ICD-10-CM | POA: Diagnosis not present

## 2022-09-23 DIAGNOSIS — M4322 Fusion of spine, cervical region: Secondary | ICD-10-CM | POA: Diagnosis not present

## 2022-09-23 DIAGNOSIS — M5412 Radiculopathy, cervical region: Secondary | ICD-10-CM | POA: Diagnosis not present

## 2022-09-27 ENCOUNTER — Ambulatory Visit: Admission: RE | Admit: 2022-09-27 | Payer: Medicare Other | Source: Ambulatory Visit

## 2022-09-28 ENCOUNTER — Ambulatory Visit: Payer: Self-pay

## 2022-09-28 DIAGNOSIS — M5412 Radiculopathy, cervical region: Secondary | ICD-10-CM | POA: Diagnosis not present

## 2022-09-28 DIAGNOSIS — M5451 Vertebrogenic low back pain: Secondary | ICD-10-CM | POA: Diagnosis not present

## 2022-09-28 DIAGNOSIS — M542 Cervicalgia: Secondary | ICD-10-CM | POA: Diagnosis not present

## 2022-09-28 DIAGNOSIS — M4322 Fusion of spine, cervical region: Secondary | ICD-10-CM | POA: Diagnosis not present

## 2022-09-28 NOTE — Patient Outreach (Signed)
  Care Coordination   Follow Up Visit Note   09/28/2022 Name: TANAYSIA DUDNEY MRN: [id number] DOB: November 29, 1950  [place] is a 72 y.o. year old female who sees Darrick Huntsman, Mar Daring, MD for primary care. I spoke with  [name] by phone today.  What matters to the patients health and wellness today?   COPD/Asthma:  Patient states overall she is doing much better. She reports completing medrol dose pack on yesterday.  Patient states she still has a cough but it's much better than it was.  Patient reports she is using her CPAP nightly.  Confirmed patient has date for new patient follow up with cardiologist.     Goals Addressed             This Visit's Progress    Effective management of COPD       Interventions Today    Flowsheet Row Most Recent Value  Chronic Disease   Chronic disease during today's visit Chronic Obstructive Pulmonary Disease (COPD), Other  [asthma]  General Interventions   General Interventions Discussed/Reviewed General Interventions Reviewed, Doctor Visits  [assessed for COPD/ asthm symptoms.  Evaluated for COPD/ asthma treatment plan and patients adherence to plan as established by provider.]  Doctor Visits Discussed/Reviewed Doctor Visits Reviewed  [name] scheduled/ upcoming provider visit appointments.]  Education Interventions   Education Provided Provided Education  [patient reports using CPAP nightly as directed.]  Provided Verbal Education On Other  [reviewed heart failure action plan/ zones.  Advised to notify provider for increase SOB, cough, chest tightness.  Call 911 for severe breathing concerns.  patient provided contact phone number for patient experience department.]  Pharmacy Interventions   Pharmacy Dicussed/Reviewed Pharmacy Topics Reviewed  [medications reviewed and compliance discussed.]               SDOH assessments and interventions completed:  No     Care Coordination Interventions:  Yes, provided   Follow up plan:  Follow up call scheduled for 11/22/22     Encounter Outcome:  Pt. Visit Completed   [name] RN,BSN,CCM [facility] Care Coordination [phone] direct line

## 2022-09-28 NOTE — Patient Instructions (Signed)
Visit Information  Thank you for taking time to visit with me today. Please don't hesitate to contact me if I can be of assistance to you.   Following are the goals we discussed today:  Notify provider for COPD / Asthma symptoms: shortness of breath, cough.  Call 911 for severe symptoms. Continue to take your medications as prescribed.  Keep follow up visits with provider Continue to wear CPAP machine nightly.   Our next appointment is by telephone on 11/22/22 at 1:30 pm  Please call the care guide team at [phone] if you need to cancel or reschedule your appointment.   If you are experiencing a Mental Health or Behavioral Health Crisis or need someone to talk to, please call the Suicide and Crisis Lifeline: 988 call 1-800-273-TALK (toll free, 24 hour hotline)  Patient verbalizes understanding of instructions and care plan provided today and agrees to view in MyChart. Active MyChart status and patient understanding of how to access instructions and care plan via MyChart confirmed with patient.     [name] RN,BSN,CCM [facility] Care Coordination [phone] direct line

## 2022-10-05 DIAGNOSIS — M5412 Radiculopathy, cervical region: Secondary | ICD-10-CM | POA: Diagnosis not present

## 2022-10-05 DIAGNOSIS — M542 Cervicalgia: Secondary | ICD-10-CM | POA: Diagnosis not present

## 2022-10-05 DIAGNOSIS — M4322 Fusion of spine, cervical region: Secondary | ICD-10-CM | POA: Diagnosis not present

## 2022-10-05 DIAGNOSIS — M5451 Vertebrogenic low back pain: Secondary | ICD-10-CM | POA: Diagnosis not present

## 2022-10-11 ENCOUNTER — Other Ambulatory Visit
Admission: RE | Admit: 2022-10-11 | Discharge: 2022-10-11 | Disposition: A | Discharge status: Home / Self Care | Payer: Medicare Other | Source: Ambulatory Visit | Attending: Pulmonary Disease | Admitting: Pulmonary Disease

## 2022-10-11 ENCOUNTER — Ambulatory Visit (INDEPENDENT_AMBULATORY_CARE_PROVIDER_SITE_OTHER): Payer: Medicare Other | Admitting: Pulmonary Disease

## 2022-10-11 ENCOUNTER — Encounter: Payer: Self-pay | Admitting: Pulmonary Disease

## 2022-10-11 VITALS — BP 120/78 | HR 85 | Temp 98.2°F | Ht 63.0 in | Wt 183.4 lb

## 2022-10-11 DIAGNOSIS — D46Z Other myelodysplastic syndromes: Secondary | ICD-10-CM | POA: Diagnosis not present

## 2022-10-11 DIAGNOSIS — R0600 Dyspnea, unspecified: Secondary | ICD-10-CM | POA: Diagnosis not present

## 2022-10-11 DIAGNOSIS — I3139 Other pericardial effusion (noninflammatory): Secondary | ICD-10-CM | POA: Diagnosis not present

## 2022-10-11 DIAGNOSIS — D539 Nutritional anemia, unspecified: Secondary | ICD-10-CM | POA: Diagnosis not present

## 2022-10-11 DIAGNOSIS — J4540 Moderate persistent asthma, uncomplicated: Secondary | ICD-10-CM | POA: Diagnosis not present

## 2022-10-11 DIAGNOSIS — J4541 Moderate persistent asthma with (acute) exacerbation: Secondary | ICD-10-CM | POA: Diagnosis not present

## 2022-10-11 LAB — CBC WITH DIFFERENTIAL/PLATELET
Abs Immature Granulocytes: 0.52 10*3/uL — ABNORMAL HIGH (ref 0.00–0.07)
Basophils Absolute: 0.1 10*3/uL (ref 0.0–0.1)
Basophils Relative: 1 %
Eosinophils Absolute: 0.2 10*3/uL (ref 0.0–0.5)
Eosinophils Relative: 2 %
HCT: 26.6 % — ABNORMAL LOW (ref 36.0–46.0)
Hemoglobin: 8.8 g/dL — ABNORMAL LOW (ref 12.0–15.0)
Immature Granulocytes: 6 %
Lymphocytes Relative: 15 %
Lymphs Abs: 1.4 10*3/uL (ref 0.7–4.0)
MCH: 37.8 pg — ABNORMAL HIGH (ref 26.0–34.0)
MCHC: 33.1 g/dL (ref 30.0–36.0)
MCV: 114.2 fL — ABNORMAL HIGH (ref 80.0–100.0)
Monocytes Absolute: 0.7 10*3/uL (ref 0.1–1.0)
Monocytes Relative: 7 %
Neutro Abs: 6.6 10*3/uL (ref 1.7–7.7)
Neutrophils Relative %: 69 %
Platelets: 114 10*3/uL — ABNORMAL LOW (ref 150–400)
RBC: 2.33 MIL/uL — ABNORMAL LOW (ref 3.87–5.11)
RDW: 16.7 % — ABNORMAL HIGH (ref 11.5–15.5)
Smear Review: NORMAL
WBC Morphology: INCREASED
WBC: 9.5 10*3/uL (ref 4.0–10.5)
nRBC: 0 % (ref 0.0–0.2)

## 2022-10-11 LAB — NITRIC OXIDE: Nitric Oxide: 11

## 2022-10-11 NOTE — Patient Instructions (Signed)
I am concerned that your shortness of breath is due to worsening anemia.  We are going to check a CBC today this is a complete blood count.  This will help [place] determine if you need a transfusion.  If you do need a transfusion, I will notify Dr. Donneta Romberg so he can arrange for it.  You have an evaluation by the cardiologist on Friday.  Keep that appointment.  They may be able to do your echocardiogram at that time as well.  We will see you in follow-up in 2 months time call sooner should any new problems arise.

## 2022-10-11 NOTE — Progress Notes (Signed)
Subjective:    Patient ID: Sarah [name], female    DOB: Jun 08, 1950, 72 y.o.   MRN: [id number]  Patient Care Team: Sherlene Shams, MD as PCP - General (Internal Medicine) Sherlene Shams, MD (Internal Medicine) Lemar Livings Merrily Pew, MD (General Surgery) Earna Coder, MD as Consulting Physician (Hematology and Oncology)  Chief Complaint  Patient presents with   Follow-up    Congestion and wheezing for a couple of days. DOE. Dry cough.     HPI Patient is a 72 year old lifelong never smoker with a history of moderate persistent asthma who presents today as a scheduled visit.  I last saw the patient on 24 Aug 2022 and at that time had requested a 2D echo due to findings of pericardial effusion on studies performed during an admission at [facility] in April.  As noted she has not had echocardiogram, she is to see cardiology on 21 June.  She has been maintained on Trelegy Ellipta 100, 1 inhalation daily for her asthma.  She is also on rescue albuterol.  She saw our nurse practitioner Rubye Oaks on 06 Sep 2022 for a sleep consult.  She has been diagnosed with sleep apnea and has started CPAP therapy.  She will be followed by our sleep medicine team for that issue.  She has been doing well with the CPAP.  Since her prior visit she has not had any wheezing.  She does complain of increased dyspnea.  No change in cough or sputum production.  No hemoptysis. No chest pain, no orthopnea or paroxysmal nocturnal dyspnea.  No lower extremity edema nor calf tenderness.  She notes that Trelegy Ellipta does help her.  She uses albuterol rescue 2-3 times per week.  Has been diagnosed with myelodysplastic syndrome and has a significant macrocytic anemia.  She does not endorse any other symptomatology today.     Review of Systems A 10 point review of systems was performed and it is as noted above otherwise negative.   Patient Active Problem List   Diagnosis Date Noted   OSA (obstructive sleep apnea)  09/06/2022   Acute respiratory distress 08/15/2022   Hyperglycemia 08/15/2022   CAP (community acquired pneumonia) 08/11/2022   Snoring 06/03/2022   Hypersomnia 05/18/2022   Suprapubic cramping 05/18/2022   Overactive bladder 05/18/2022   MDS (myelodysplastic syndrome), low grade (HCC) 04/20/2022   Tubular adenoma of colon 02/07/2022   Dysuria 02/06/2022   Cervical myelopathy (HCC) 11/10/2021   Spinal stenosis, cervical region 09/15/2021   Adenomatous polyp of transverse colon    Polyp of colon    Thrombocytopenia (HCC) 01/13/2021   Essential hypertension 12/20/2020   Macrocytic anemia 12/18/2020   Allergic rhinitis 12/13/2020   Fatigue 12/13/2020   Insomnia 12/13/2020   Grief reaction 08/09/2020   Cognitive complaints 08/09/2020   Neuropathy 06/14/2020   Educated about COVID-19 virus infection 04/10/2019   Prediabetes 10/13/2018   LVH (left ventricular hypertrophy) due to hypertensive disease, without heart failure 10/13/2018   Hospital discharge follow-up 07/08/2018   Chest tightness 06/26/2018   Asthma exacerbation 06/04/2018   Hypothyroid 06/15/2017   Post herpetic neuralgia 06/06/2017   Encounter for preventive health examination 09/09/2014   Chronic GERD 09/09/2014   Hyperlipidemia 08/17/2014   S/P hysterectomy 04/28/2013   Initial Medicare annual wellness visit 04/28/2013   Depression     Social History   Tobacco Use   Smoking status: Never   Smokeless tobacco: Never  Substance Use Topics   Alcohol use: No  No Known Allergies  Current Meds  Medication Sig   albuterol (VENTOLIN HFA) 108 (90 Base) MCG/ACT inhaler Inhale 1-2 puffs into the lungs every 4 (four) hours as needed for wheezing or shortness of breath.   amLODipine (NORVASC) 5 MG tablet TAKE 1 TABLET BY MOUTH DAILY   Calcium Citrate-Vitamin D (CALCIUM CITRATE + D PO) Take 1 tablet by mouth 2 (two) times daily.   citalopram (CELEXA) 20 MG tablet TAKE 1 TABLET BY MOUTH DAILY   famotidine (PEPCID)  20 MG tablet TAKE 1 TABLET BY MOUTH DAILY. BEFORE DINNER   ferrous sulfate 324 MG TBEC Take 324 mg by mouth. 65 mg of elemental iron   fexofenadine (ALLEGRA) 180 MG tablet Take 180 mg by mouth daily.   Fluticasone-Umeclidin-Vilant (TRELEGY ELLIPTA) 100-62.5-25 MCG/ACT AEPB Inhale 1 puff into the lungs daily.   gabapentin (NEURONTIN) 100 MG capsule Take 3 capsules (300 mg total) by mouth at bedtime. Home med.   guaiFENesin-codeine (CHERATUSSIN AC) 100-10 MG/5ML syrup Take 5 mLs by mouth 3 (three) times daily as needed for cough.   levothyroxine (SYNTHROID) 50 MCG tablet TAKE 1 TABLET BY MOUTH DAILY ON  EMPTY STOMACH WITH A GLASS OF  WATER AT LEAST 30 TO 60 MIN  BEFORE BREAKFAST   pantoprazole (PROTONIX) 40 MG tablet TAKE 1 TABLET BY MOUTH DAILY   rosuvastatin (CRESTOR) 10 MG tablet Take 1 tablet (10 mg total) by mouth at bedtime. Home med.   solifenacin (VESICARE) 5 MG tablet TAKE ONE TABLET BY MOUTH EVERY DAY   traZODone (DESYREL) 50 MG tablet TAKE 1/2 TO 1 TABLET BY MOUTH AT BEDTIME AS NEEDED FOR SLEEP   vitamin B-12 (CYANOCOBALAMIN) 1000 MCG tablet Take 1,000 mcg by mouth daily.    Immunization History  Administered Date(s) Administered   Fluad Quad(high Dose 65+) 12/06/2018   Influenza, High Dose Seasonal PF 04/22/2016, 06/05/2017   Influenza,inj,Quad PF,6+ Mos 04/26/2013, 01/30/2015   Influenza-Unspecified 01/24/2018, 12/06/2018, 01/02/2020, 04/02/2021, 02/03/2022   Moderna Covid-19 Vaccine Bivalent Booster 45yrs & up 02/05/2021, 02/03/2022   Moderna Sars-Covid-2 Vaccination 06/20/2019, 07/22/2019, 02/14/2020   PNEUMOCOCCAL CONJUGATE-20 11/09/2020   Pneumococcal Conjugate-13 09/01/2014, 01/24/2018   Pneumococcal Polysaccharide-23 04/26/2013, 02/28/2019   Rsv, Bivalent, Protein Subunit Rsvpref,pf Verdis Frederickson) 05/26/2022   Tdap 06/18/2014, 05/26/2022   Zoster Recombinat (Shingrix) 07/08/2017, 09/22/2017        Objective:     BP 120/78 (BP Location: Left Arm, Cuff Size: Normal)    Pulse 85   Temp 98.2 F (36.8 C)   Ht 5\' 3"  (1.6 m)   Wt 183 lb 6.4 oz (83.2 kg)   SpO2 94%   BMI 32.49 kg/m   SpO2: 94 % O2 Device: None (Room air)  GENERAL: Overweight woman, no acute distress.  Fully ambulatory, no conversational dyspnea, very pale. HEAD: Normocephalic, atraumatic.  EYES: Pupils equal, round, reactive to light.  No scleral icterus.  Pale conjunctiva. MOUTH: Natural dentition present, few chipped teeth.  Oral mucosa moist. NECK: Supple. No thyromegaly. Trachea midline. No JVD.  No adenopathy. PULMONARY: Lungs sound coarse but otherwise clear to auscultation bilaterally. CARDIOVASCULAR: S1 and S2. Regular rate and rhythm.  No rubs, murmurs gallops heard. GASTROINTESTINAL: Benign. MUSCULOSKELETAL: No joint deformity, no clubbing, no edema.  NEUROLOGIC: No overt focal deficits. SKIN: Intact,warm,dry. PSYCH: Mood and behavior appropriate.     Lab Results  Component Value Date   NITRICOXIDE 11 10/11/2022   CBC    Component Value Date/Time   WBC 9.5 10/11/2022 1242   RBC 2.33 (L) 10/11/2022 1242  HGB 8.8 (L) 10/11/2022 1242   HCT 26.6 (L) 10/11/2022 1242   PLT 114 (L) 10/11/2022 1242   MCV 114.2 (H) 10/11/2022 1242   MCH 37.8 (H) 10/11/2022 1242   MCHC 33.1 10/11/2022 1242   RDW 16.7 (H) 10/11/2022 1242   LYMPHSABS 1.4 10/11/2022 1242   MONOABS 0.7 10/11/2022 1242   EOSABS 0.2 10/11/2022 1242   BASOSABS 0.1 10/11/2022 1242     Assessment & Plan:     ICD-10-CM   1. Moderate persistent asthma without complication  J45.40 Nitric oxide   She is well compensated in this regard Continue Trelegy Ellipta Continue as needed albuterol    2. Dyspnea, unspecified type  R06.00 CBC With Differential   Worsening dyspnea may be due to worsening anemia She does not have bronchospasm No oxygen desaturations    3. Macrocytic anemia  D53.9 CBC With Differential   Check CBC today Anemia worsening May need transfusion Follows with hematology    4. MDS  (myelodysplastic syndrome), low grade (HCC)  D46.Z CBC With Differential   As above Worsening anemia May need transfusion Has upcoming hematology appointment    5. Pericardial effusion  I31.39    To see cardiology 21 June Echo ordered previously      Orders Placed This Encounter  Procedures   CBC With Differential    Standing Status:   Future    Standing Expiration Date:   04/12/2023   Nitric oxide   We will see the patient in follow-up in 2 months time she is to contact us prior to that time should any new difficulties arise.  Gailen Shelter, MD Advanced Bronchoscopy [facility]    *This note was dictated using voice recognition software/Dragon.  Despite best efforts to proofread, errors can occur which can change the meaning. Any transcriptional errors that result from this process are unintentional and may not be fully corrected at the time of dictation.

## 2022-10-12 DIAGNOSIS — M5451 Vertebrogenic low back pain: Secondary | ICD-10-CM | POA: Diagnosis not present

## 2022-10-12 DIAGNOSIS — M5412 Radiculopathy, cervical region: Secondary | ICD-10-CM | POA: Diagnosis not present

## 2022-10-12 DIAGNOSIS — M542 Cervicalgia: Secondary | ICD-10-CM | POA: Diagnosis not present

## 2022-10-12 DIAGNOSIS — M4322 Fusion of spine, cervical region: Secondary | ICD-10-CM | POA: Diagnosis not present

## 2022-10-14 ENCOUNTER — Encounter: Payer: Self-pay | Admitting: Cardiology

## 2022-10-14 ENCOUNTER — Other Ambulatory Visit
Admission: RE | Admit: 2022-10-14 | Discharge: 2022-10-14 | Disposition: A | Discharge status: Home / Self Care | Payer: Medicare Other | Source: Ambulatory Visit | Attending: Cardiology | Admitting: Cardiology

## 2022-10-14 ENCOUNTER — Ambulatory Visit: Payer: Medicare Other | Attending: Cardiology | Admitting: Cardiology

## 2022-10-14 VITALS — BP 110/62 | HR 70 | Ht 63.0 in | Wt 179.8 lb

## 2022-10-14 DIAGNOSIS — I2089 Other forms of angina pectoris: Secondary | ICD-10-CM

## 2022-10-14 DIAGNOSIS — E7800 Pure hypercholesterolemia, unspecified: Secondary | ICD-10-CM

## 2022-10-14 DIAGNOSIS — I10 Essential (primary) hypertension: Secondary | ICD-10-CM | POA: Diagnosis not present

## 2022-10-14 DIAGNOSIS — R0609 Other forms of dyspnea: Secondary | ICD-10-CM | POA: Diagnosis not present

## 2022-10-14 LAB — BASIC METABOLIC PANEL
Anion gap: 7 (ref 5–15)
BUN: 18 mg/dL (ref 8–23)
CO2: 27 mmol/L (ref 22–32)
Calcium: 9.3 mg/dL (ref 8.9–10.3)
Chloride: 103 mmol/L (ref 98–111)
Creatinine, Ser: 0.75 mg/dL (ref 0.44–1.00)
GFR, Estimated: >60 mL/min (ref >60)
Glucose, Bld: 83 mg/dL (ref 70–99)
Potassium: 4.4 mmol/L (ref 3.5–5.1)
Sodium: 137 mmol/L (ref 135–145)

## 2022-10-14 MED ORDER — METOPROLOL TARTRATE 100 MG PO TABS: 100.0000 mg | ORAL_TABLET | Freq: Once | ORAL | 0 refills | Status: DC

## 2022-10-14 NOTE — Progress Notes (Signed)
Cardiology Office Note:    Date:  10/14/2022   ID:  Sarah [name], DOB [birth year], MRN [id number]  PCP:  Sherlene Shams, MD   [facility] Providers Cardiologist:  Debbe Odea, MD     Referring MD: Sherlene Shams, MD   Chief Complaint  Patient presents with   New Patient (Initial Visit)    Patient referred for cardiac evaluation of dyspnea on exertion.      History of Present Illness:    Sarah [name] is a 72 y.o. female with a hx of hypertension, hyperlipidemia, hypothyroidism, asthma presenting due to shortness of breath.  Symptoms of shortness of breath with exertion has been going on for over 10 years now.  Last seen in the clinic 4 years ago in 2020 where echocardiogram and Lexiscan Myoview were performed, test was unrevealing.  Was admitted to the hospital 2 months ago with viral pneumonia.  She denies chest pain.  Shortness of breath is worse with humidity.  PFT 06/2018 showed mild asthma, decreased lung capacity likely related to weight.  Prior notes/studies Echo 07/2018 EF 60 to 65% Lexiscan Myoview 06/2018 low risk, no ischemia  Past Medical History:  Diagnosis Date   Arthritis    Asthma    Depression    Dyspnea    with heat   Environmental and seasonal allergies    GERD (gastroesophageal reflux disease)    Hyperlipidemia    Hypertension    Hypothyroidism    Presence of dental prosthetic device    Dental implants   Wheezing     Past Surgical History:  Procedure Laterality Date   ABDOMINAL HYSTERECTOMY  04/26/1987   ANTERIOR CERVICAL DECOMPRESSION/DISCECTOMY FUSION 4 LEVELS N/A 11/10/2021   Procedure: C3-7 ANTERIOR CERVICAL DISCECTOMY AND FUSION (GLOBUS FORGE);  Surgeon: Venetia Night, MD;  Location: [facility];  Service: Neurosurgery;  Laterality: N/A;   BROW LIFT Bilateral 02/07/2020   Procedure: BLEPHAROPLASTY UPPER EYELID; W/EXCESS SKIN BROW PTOSIS REPAIR BILATERAL;  Surgeon: Imagene Riches, MD;  Location: [facility];   Service: Ophthalmology;  Laterality: Bilateral;   CATARACT EXTRACTION W/PHACO Right 09/13/2016   Procedure: CATARACT EXTRACTION PHACO AND INTRAOCULAR LENS PLACEMENT (IOC);  Surgeon: Galen Manila, MD;  Location: [facility];  Service: Ophthalmology;  Laterality: Right;  [place] 00:38 AP% 14.3 CDE 5.51 Fluid pack lot # [id number]   CATARACT EXTRACTION W/PHACO Left 10/11/2016   Procedure: CATARACT EXTRACTION PHACO AND INTRAOCULAR LENS PLACEMENT (IOC);  Surgeon: Galen Manila, MD;  Location: [facility];  Service: Ophthalmology;  Laterality: Left;  [place] 00:30 AP% 11.3 CDE 3.50 fluid pack lot # [id number]   COLONOSCOPY WITH PROPOFOL N/A 09/30/2014   Procedure: COLONOSCOPY WITH PROPOFOL;  Surgeon: Earline Mayotte, MD;  Location: [facility];  Service: Endoscopy;  Laterality: N/A;   COLONOSCOPY WITH PROPOFOL N/A 03/24/2021   Procedure: COLONOSCOPY WITH PROPOFOL;  Surgeon: Toney Reil, MD;  Location: [facility];  Service: Gastroenterology;  Laterality: N/A;   ESOPHAGOGASTRODUODENOSCOPY N/A 09/30/2014   Procedure: ESOPHAGOGASTRODUODENOSCOPY (EGD);  Surgeon: Earline Mayotte, MD;  Location: [facility];  Service: Endoscopy;  Laterality: N/A;   ESOPHAGOGASTRODUODENOSCOPY N/A 03/24/2021   Procedure: ESOPHAGOGASTRODUODENOSCOPY (EGD);  Surgeon: Toney Reil, MD;  Location: [facility];  Service: Gastroenterology;  Laterality: N/A;   FOOT SURGERY Bilateral 04/26/1983   KNEE SURGERY  04/25/2014    Current Medications: Current Meds  Medication Sig   albuterol (VENTOLIN HFA) 108 (90 Base) MCG/ACT inhaler Inhale 1-2 puffs into the lungs every 4 (four) hours as  needed for wheezing or shortness of breath.   amLODipine (NORVASC) 5 MG tablet TAKE 1 TABLET BY MOUTH DAILY   Calcium Citrate-Vitamin D (CALCIUM CITRATE + D PO) Take 1 tablet by mouth 2 (two) times daily.   citalopram (CELEXA) 20 MG tablet TAKE 1 TABLET BY MOUTH DAILY   famotidine (PEPCID) 20 MG tablet TAKE 1 TABLET BY MOUTH  DAILY. BEFORE DINNER   ferrous sulfate 324 MG TBEC Take 324 mg by mouth. 65 mg of elemental iron   fexofenadine (ALLEGRA) 180 MG tablet Take 180 mg by mouth daily.   Fluticasone-Umeclidin-Vilant (TRELEGY ELLIPTA) 100-62.5-25 MCG/ACT AEPB Inhale 1 puff into the lungs daily.   gabapentin (NEURONTIN) 100 MG capsule Take 3 capsules (300 mg total) by mouth at bedtime. Home med.   levothyroxine (SYNTHROID) 50 MCG tablet TAKE 1 TABLET BY MOUTH DAILY ON  EMPTY STOMACH WITH A GLASS OF  WATER AT LEAST 30 TO 60 MIN  BEFORE BREAKFAST   metoprolol tartrate (LOPRESSOR) 100 MG tablet Take 1 tablet (100 mg total) by mouth once for 1 dose. TWO HOURS PRIOR TO CARDIAC CT   pantoprazole (PROTONIX) 40 MG tablet TAKE 1 TABLET BY MOUTH DAILY   rosuvastatin (CRESTOR) 10 MG tablet Take 1 tablet (10 mg total) by mouth at bedtime. Home med.   solifenacin (VESICARE) 5 MG tablet TAKE ONE TABLET BY MOUTH EVERY DAY   traZODone (DESYREL) 50 MG tablet TAKE 1/2 TO 1 TABLET BY MOUTH AT BEDTIME AS NEEDED FOR SLEEP   vitamin B-12 (CYANOCOBALAMIN) 1000 MCG tablet Take 1,000 mcg by mouth daily.   [DISCONTINUED] Fluticasone-Umeclidin-Vilant (TRELEGY ELLIPTA) 100-62.5-25 MCG/ACT AEPB Inhale 1 puff into the lungs daily at 6 (six) AM.     Allergies:   Patient has no known allergies.   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 No smoking; no alcohol. Lives in [place], [place] lives self/kitty.    Social Determinants of Health   Financial  Resource Strain: Low Risk  (05/12/2022)   Overall Financial Resource Strain (CARDIA)    Difficulty of Paying Living Expenses: Not very hard  Food Insecurity: No Food Insecurity (09/02/2022)   Hunger Vital Sign    Worried About Running Out of Food in the Last Year: Never true    Ran Out of Food in the Last Year: Never true  Transportation Needs: No Transportation Needs (09/02/2022)   PRAPARE - Administrator, Civil Service (Medical): No    Lack of Transportation (Non-Medical): No  Physical Activity: Insufficiently Active (05/12/2022)   Exercise Vital Sign    Days of Exercise per Week: 1 day    Minutes of Exercise per Session: 60 min  Stress: No Stress Concern Present (05/12/2022)   [employer] of Occupational Health - Occupational Stress Questionnaire    Feeling of Stress : Not at all  Social Connections: Unknown (05/12/2022)   Social Connection and  Isolation Panel [NHANES]    Frequency of Communication with Friends and Family: More than three times a week    Frequency of Social Gatherings with Friends and Family: More than three times a week    Attends Religious Services: More than 4 times per year    Active Member of [employer] or Organizations: Yes    Attends Engineer, structural: More than 4 times per year    Marital Status: Not on file     Family History: The patient's family history includes Diabetes in her mother; Heart disease in her mother; Hyperlipidemia in her mother. There is no history of Prostate cancer, Kidney cancer, or Bladder Cancer.  ROS:   Please see the history of present illness.     All other systems reviewed and are negative.  EKGs/Labs/Other Studies Reviewed:    The following studies were reviewed today:  EKG obtained today shows normal sinus rhythm, normal ECG.      Recent Labs: 08/16/2022: ALT 33 08/19/2022: Magnesium 2.2 10/11/2022: Hemoglobin 8.8; Platelets 114 10/14/2022: BUN 18; Creatinine, Ser 0.75; Potassium 4.4; Sodium 137   Recent Lipid Panel    Component Value Date/Time   CHOL 147 09/15/2021 0758   TRIG 59.0 09/15/2021 0758   HDL 64.20 09/15/2021 0758   CHOLHDL 2 09/15/2021 0758   VLDL 11.8 09/15/2021 0758   LDLCALC 71 09/15/2021 0758   LDLCALC 142 (H) 04/06/2018 1520     Risk Assessment/Calculations:              Physical Exam:    VS:  BP 110/62 (BP Location: Right Arm, Patient Position: Sitting, Cuff Size: Normal)   Pulse 70   Ht 5\' 3"  (1.6 m)   Wt 179 lb 12.8 oz (81.6 kg)   SpO2 96%   BMI 31.85 kg/m     Wt Readings from Last 3 Encounters:  10/14/22 179 lb 12.8 oz (81.6 kg)  10/11/22 183 lb 6.4 oz (83.2 kg)  09/06/22 172 lb (78 kg)     GEN:  Well nourished, well developed in no acute distress HEENT: Normal NECK: No JVD; No carotid bruits CARDIAC: RRR, no murmurs, rubs, gallops RESPIRATORY:  Clear to auscultation without rales, wheezing or rhonchi  ABDOMEN: Soft, non-tender, non-distended MUSCULOSKELETAL:  No edema; No deformity  SKIN: Warm and dry NEUROLOGIC:  Alert and oriented x 3 PSYCHIATRIC:  Normal affect   ASSESSMENT:    1. Dyspnea on exertion   2. Primary hypertension   3. Pure hypercholesterolemia   4. Anginal equivalent    PLAN:    In order of problems listed above:  Dyspnea on exertion, this could be an anginal equivalent.  Although pulmonary etiology/deconditioning also in differential.  Repeat echo, obtain coronary CT.  If no significant findings, will recommend graduated exercises to build tolerance and stamina. Hypertension, BP controlled, continue Norvasc 5 mg daily. Hyperlipidemia, cholesterol controlled, continue Crestor 10 mg daily.   Follow-up after cardiac testing      Medication Adjustments/Labs and Tests Ordered: Current medicines are reviewed at length with the patient today.  Concerns regarding medicines are outlined above.  Orders Placed This Encounter  Procedures   CT CORONARY MORPH W/CTA COR W/SCORE W/CA W/CM &/OR WO/CM   Basic  Metabolic Panel (BMET)   EKG 12-Lead   ECHOCARDIOGRAM COMPLETE   Meds ordered this encounter  Medications   metoprolol tartrate (LOPRESSOR) 100 MG tablet    Sig: Take 1 tablet (100 mg total) by mouth once for 1 dose. TWO HOURS PRIOR  TO CARDIAC CT    Dispense:  1 tablet    Refill:  0    Patient Instructions  Medication Instructions:   Your physician recommends that you continue on your current medications as directed. Please refer to the Current Medication list given to you today.  *If you need a refill on your cardiac medications before your next appointment, please call your pharmacy*   Lab Work:  Your physician recommends you go to the medical mall for labs - BMP  If you have labs (blood work) drawn today and your tests are completely normal, you will receive your results only by: MyChart Message (if you have MyChart) OR A paper copy in the mail If you have any lab test that is abnormal or we need to change your treatment, we will call you to review the results.   Testing/Procedures:  Your physician has requested that you have an echocardiogram. Echocardiography is a painless test that uses sound waves to create images of your heart. It provides your doctor with information about the size and shape of your heart and how well your heart's chambers and valves are working. This procedure takes approximately one hour. There are no restrictions for this procedure. Please do NOT wear cologne, perfume, aftershave, or lotions (deodorant is allowed). Please arrive 15 minutes prior to your appointment time.    Your cardiac CT will be scheduled at one of the below locations:   [facility] [address] [place], [place] [address] [phone]  OR   [facility] [address] [place], [place] [address] [phone]  If scheduled at [facility] or [facility],  please arrive 15 mins early for check-in and test prep.   Please follow these instructions carefully (unless otherwise directed):  An IV will be required for this test and Nitroglycerin will be given.  Hold all erectile dysfunction medications at least 3 days (72 hrs) prior to test. (Ie viagra, cialis, sildenafil, tadalafil, etc)   On the Night Before the Test: Be sure to Drink plenty of water. Do not consume any caffeinated/decaffeinated beverages or chocolate 12 hours prior to your test. Do not take any antihistamines 12 hours prior to your test.  On the Day of the Test: Drink plenty of water until 1 hour prior to the test. Do not eat any food 1 hour prior to test. You may take your regular medications prior to the test.  Take metoprolol (Lopressor) two hours prior to test. FEMALES- please wear underwire-free bra if available, avoid dresses & tight clothing  After the Test: Drink plenty of water. After receiving IV contrast, you may experience a mild flushed feeling. This is normal. On occasion, you may experience a mild rash up to 24 hours after the test. This is not dangerous. If this occurs, you can take Benadryl 25 mg and increase your fluid intake. If you experience trouble breathing, this can be serious. If it is severe call 911 IMMEDIATELY. If it is mild, please call our office.  For scheduling needs, including cancellations and rescheduling, please call [place], [phone].   Follow-Up: At [facility], you and your health needs are our priority.  As part of our continuing mission to provide you with exceptional heart care, we have created designated Provider Care Teams.  These Care Teams include your primary Cardiologist (physician) and Advanced Practice Providers (APPs -  Physician Assistants and Nurse Practitioners) who all work together to  provide you with the care you need, when you need it.  We recommend signing up for the patient portal called "MyChart".   Sign up information is provided on this After Visit Summary.  MyChart is used to connect with patients for Virtual Visits (Telemedicine).  Patients are able to view lab/test results, encounter notes, upcoming appointments, etc.  Non-urgent messages can be sent to your provider as well.   To learn more about what you can do with MyChart, go to [email or web address].    Your next appointment:    After testing  Provider:   You may see Debbe Odea, MD or one of the following Advanced Practice Providers on your designated Care Team:   Nicolasa Ducking, NP Eula Listen, PA-C Cadence Fransico Michael, PA-C Charlsie Quest, NP    Signed, Debbe Odea, MD  10/14/2022 1:38 PM    [facility]

## 2022-10-14 NOTE — Patient Instructions (Signed)
Medication Instructions:   Your physician recommends that you continue on your current medications as directed. Please refer to the Current Medication list given to you today.  *If you need a refill on your cardiac medications before your next appointment, please call your pharmacy*   Lab Work:  Your physician recommends you go to the medical mall for labs - BMP  If you have labs (blood work) drawn today and your tests are completely normal, you will receive your results only by: MyChart Message (if you have MyChart) OR A paper copy in the mail If you have any lab test that is abnormal or we need to change your treatment, we will call you to review the results.   Testing/Procedures:  Your physician has requested that you have an echocardiogram. Echocardiography is a painless test that uses sound waves to create images of your heart. It provides your doctor with information about the size and shape of your heart and how well your heart's chambers and valves are working. This procedure takes approximately one hour. There are no restrictions for this procedure. Please do NOT wear cologne, perfume, aftershave, or lotions (deodorant is allowed). Please arrive 15 minutes prior to your appointment time.    Your cardiac CT will be scheduled at one of the below locations:   [facility] [address] [place], [place] [address] [phone]  OR   [facility] [address] [place], [place] [address] [phone]  If scheduled at [facility] or [facility], please arrive 15 mins early for check-in and test prep.   Please follow these instructions carefully (unless otherwise directed):  An IV will be required for this test and Nitroglycerin will be given.  Hold all erectile dysfunction medications at least 3 days (72 hrs) prior to test. (Ie viagra, cialis, sildenafil,  tadalafil, etc)   On the Night Before the Test: Be sure to Drink plenty of water. Do not consume any caffeinated/decaffeinated beverages or chocolate 12 hours prior to your test. Do not take any antihistamines 12 hours prior to your test.  On the Day of the Test: Drink plenty of water until 1 hour prior to the test. Do not eat any food 1 hour prior to test. You may take your regular medications prior to the test.  Take metoprolol (Lopressor) two hours prior to test. FEMALES- please wear underwire-free bra if available, avoid dresses & tight clothing  After the Test: Drink plenty of water. After receiving IV contrast, you may experience a mild flushed feeling. This is normal. On occasion, you may experience a mild rash up to 24 hours after the test. This is not dangerous. If this occurs, you can take Benadryl 25 mg and increase your fluid intake. If you experience trouble breathing, this can be serious. If it is severe call 911 IMMEDIATELY. If it is mild, please call our office.  For scheduling needs, including cancellations and rescheduling, please call [place], [phone].   Follow-Up: At [facility], you and your health needs are our priority.  As part of our continuing mission to provide you with exceptional heart care, we have created designated Provider Care Teams.  These Care Teams include your primary Cardiologist (physician) and Advanced Practice Providers (APPs -  Physician Assistants and Nurse Practitioners) who all work together to provide you with the care you need, when you need it.  We recommend signing up for the patient portal called "MyChart".  Sign up information is provided on this After Visit Summary.  MyChart is used to connect with patients for Virtual Visits (Telemedicine).  Patients are able to view lab/test results, encounter notes, upcoming appointments, etc.  Non-urgent messages can be sent to your provider as well.   To learn more about what you can  do with MyChart, go to [email or web address].    Your next appointment:    After testing  Provider:   You may see Debbe Odea, MD or one of the following Advanced Practice Providers on your designated Care Team:   Nicolasa Ducking, NP Eula Listen, PA-C Cadence Fransico Michael, PA-C Charlsie Quest, NP

## 2022-10-15 ENCOUNTER — Encounter: Payer: Self-pay | Admitting: Pulmonary Disease

## 2022-10-19 DIAGNOSIS — M5412 Radiculopathy, cervical region: Secondary | ICD-10-CM | POA: Diagnosis not present

## 2022-10-19 DIAGNOSIS — M542 Cervicalgia: Secondary | ICD-10-CM | POA: Diagnosis not present

## 2022-10-19 DIAGNOSIS — M5451 Vertebrogenic low back pain: Secondary | ICD-10-CM | POA: Diagnosis not present

## 2022-10-19 DIAGNOSIS — M4322 Fusion of spine, cervical region: Secondary | ICD-10-CM | POA: Diagnosis not present

## 2022-10-21 DIAGNOSIS — G4733 Obstructive sleep apnea (adult) (pediatric): Secondary | ICD-10-CM | POA: Diagnosis not present

## 2022-10-24 ENCOUNTER — Ambulatory Visit: Payer: Medicare Other | Attending: Cardiology

## 2022-10-24 DIAGNOSIS — R0609 Other forms of dyspnea: Secondary | ICD-10-CM | POA: Diagnosis not present

## 2022-10-24 LAB — ECHOCARDIOGRAM COMPLETE
AR max vel: 2.12 cm2
AV Area VTI: 2.18 cm2
AV Area mean vel: 2.15 cm2
AV Mean grad: 6 mmHg
AV Peak grad: 11.4 mmHg
Ao pk vel: 1.69 m/s
Area-P 1/2: 3.08 cm2
Calc EF: 58.5 %
S' Lateral: 3.3 cm
Single Plane A2C EF: 58.7 %
Single Plane A4C EF: 57.6 %

## 2022-10-26 DIAGNOSIS — M5451 Vertebrogenic low back pain: Secondary | ICD-10-CM | POA: Diagnosis not present

## 2022-10-26 DIAGNOSIS — M542 Cervicalgia: Secondary | ICD-10-CM | POA: Diagnosis not present

## 2022-10-26 DIAGNOSIS — M4322 Fusion of spine, cervical region: Secondary | ICD-10-CM | POA: Diagnosis not present

## 2022-10-26 DIAGNOSIS — M5412 Radiculopathy, cervical region: Secondary | ICD-10-CM | POA: Diagnosis not present

## 2022-11-02 DIAGNOSIS — M5412 Radiculopathy, cervical region: Secondary | ICD-10-CM | POA: Diagnosis not present

## 2022-11-02 DIAGNOSIS — M4322 Fusion of spine, cervical region: Secondary | ICD-10-CM | POA: Diagnosis not present

## 2022-11-02 DIAGNOSIS — M542 Cervicalgia: Secondary | ICD-10-CM | POA: Diagnosis not present

## 2022-11-02 DIAGNOSIS — M5451 Vertebrogenic low back pain: Secondary | ICD-10-CM | POA: Diagnosis not present

## 2022-11-07 ENCOUNTER — Ambulatory Visit: Payer: Medicare Other | Admitting: Medical

## 2022-11-16 ENCOUNTER — Telehealth (HOSPITAL_COMMUNITY): Payer: Self-pay | Admitting: *Deleted

## 2022-11-16 NOTE — Telephone Encounter (Signed)
Reaching out to patient to offer assistance regarding upcoming cardiac imaging study; pt verbalizes understanding of appt date/time, parking situation and where to check in, pre-test NPO status and medications ordered, and verified current allergies; name and call back number provided for further questions should they arise ? ?Merle Prescott RN Navigator Cardiac Imaging ?[facility] ?[phone] office ?[phone] cell ? ?Patient to take 100mg metoprolol tartrate two hours prior to her cardiac CT scan.  ?

## 2022-11-17 ENCOUNTER — Ambulatory Visit
Admission: RE | Admit: 2022-11-17 | Discharge: 2022-11-17 | Disposition: A | Discharge status: Home / Self Care | Payer: Medicare Other | Source: Ambulatory Visit | Attending: Cardiology | Admitting: Cardiology

## 2022-11-17 DIAGNOSIS — R0609 Other forms of dyspnea: Secondary | ICD-10-CM | POA: Diagnosis not present

## 2022-11-17 DIAGNOSIS — I2510 Atherosclerotic heart disease of native coronary artery without angina pectoris: Secondary | ICD-10-CM

## 2022-11-17 MED ORDER — IOHEXOL 350 MG/ML SOLN
100.0000 mL | Freq: Once | INTRAVENOUS | Status: AC | PRN
  Administered 2022-11-17: 100 mL via INTRAVENOUS

## 2022-11-17 MED ORDER — SODIUM CHLORIDE 0.9 % IV SOLN: INTRAVENOUS | Status: DC

## 2022-11-17 MED ORDER — NITROGLYCERIN 0.4 MG SL SUBL
0.8000 mg | SUBLINGUAL_TABLET | Freq: Once | SUBLINGUAL | Status: AC
  Administered 2022-11-17: 0.8 mg via SUBLINGUAL

## 2022-11-17 NOTE — Progress Notes (Signed)
Patient tolerated CT well. Drank water after. Vital signs stable encourage to drink water throughout day.Reasons explained and verbalized understanding. Ambulated steady gait.  

## 2022-11-20 DIAGNOSIS — G4733 Obstructive sleep apnea (adult) (pediatric): Secondary | ICD-10-CM | POA: Diagnosis not present

## 2022-11-21 ENCOUNTER — Encounter: Payer: Self-pay | Admitting: Internal Medicine

## 2022-11-21 ENCOUNTER — Other Ambulatory Visit: Payer: Self-pay

## 2022-11-21 ENCOUNTER — Telehealth (INDEPENDENT_AMBULATORY_CARE_PROVIDER_SITE_OTHER): Payer: Medicare Other | Admitting: Internal Medicine

## 2022-11-21 VITALS — BP 126/70 | HR 70 | Temp 97.8°F | Ht 63.0 in | Wt 179.8 lb

## 2022-11-21 DIAGNOSIS — D539 Nutritional anemia, unspecified: Secondary | ICD-10-CM

## 2022-11-21 DIAGNOSIS — I10 Essential (primary) hypertension: Secondary | ICD-10-CM

## 2022-11-21 DIAGNOSIS — G4710 Hypersomnia, unspecified: Secondary | ICD-10-CM | POA: Diagnosis not present

## 2022-11-21 DIAGNOSIS — R5383 Other fatigue: Secondary | ICD-10-CM | POA: Diagnosis not present

## 2022-11-21 DIAGNOSIS — E039 Hypothyroidism, unspecified: Secondary | ICD-10-CM | POA: Diagnosis not present

## 2022-11-21 DIAGNOSIS — E785 Hyperlipidemia, unspecified: Secondary | ICD-10-CM | POA: Diagnosis not present

## 2022-11-21 DIAGNOSIS — R7303 Prediabetes: Secondary | ICD-10-CM

## 2022-11-21 MED ORDER — AMLODIPINE BESYLATE 5 MG PO TABS
5.0000 mg | ORAL_TABLET | Freq: Every day | ORAL | 2 refills | Status: DC
Start: 2022-11-21 — End: 2022-11-23

## 2022-11-21 MED ORDER — PANTOPRAZOLE SODIUM 40 MG PO TBEC: 40.0000 mg | DELAYED_RELEASE_TABLET | Freq: Every day | ORAL | 2 refills | Status: DC

## 2022-11-21 NOTE — Assessment & Plan Note (Signed)
Thyroid function is overdue for checking

## 2022-11-21 NOTE — Assessment & Plan Note (Signed)
Her  random glucose is not  elevated but her A1c suggests she is at risk for developing diabetes.  I recommend she follow a low glycemic index diet and particpate regularly in an aerobic  exercise activity.  We are checking an A1c

## 2022-11-21 NOTE — Assessment & Plan Note (Signed)
Well controlled on current regimen of amlodipine 5 mg daily .  Renal function stable, no changes today. 

## 2022-11-21 NOTE — Assessment & Plan Note (Signed)
Secondary to MDS and OSA , both of which are under treatment. Given the prognosis  of MDS, I encouraged her to prioritize her activities to avoid wasting her energy of tasks that do not enrich or pleasure her  (eg, goidn her grocery shopping on line, etc).   Lab Results  Component Value Date   TSH 1.47 09/15/2021   Lab Results  Component Value Date   VITAMINB12 >1500 (H) 05/18/2022   Lab Results  Component Value Date   WBC 9.5 10/11/2022   HGB 8.8 (L) 10/11/2022   HCT 26.6 (L) 10/11/2022   MCV 114.2 (H) 10/11/2022   PLT 114 (L) 10/11/2022

## 2022-11-21 NOTE — Assessment & Plan Note (Signed)
Secondary to moderate OSA diagnosed by April 2024 home sleep study .  Reminded to follow up with Tammy parrett to monitor her compliance with CPAP.  Encouraged her to lose weight as well

## 2022-11-21 NOTE — Progress Notes (Signed)
Virtual Visit via Caregility   Note   This format is felt to be most appropriate for this patient at this time.  All issues noted in this document were discussed and addressed.  No physical exam was performed (except for noted visual exam findings with Video Visits).   I connected with Sarah [name] on 11/21/22 at  1:30 PM EDT by a video enabled telemedicine application and verified that I am speaking with the correct person using two identifiers. Location patient: home Location provider: work or home office Persons participating in the virtual visit: patient, provider  I discussed the limitations, risks, security and privacy concerns of performing an evaluation and management service by telephone and the availability of in person appointments. I also discussed with the patient that there may be a patient responsible charge related to this service. The patient expressed understanding and agreed to proceed.  Reason for visit: follow up on chronic issues   HPI:  72 yr old female with moderate persistent asthma, recently diagnosed MDS with macrocytic anemia, OSA now on CPAP here for follow up    Cc is fatigue :  pervasive. Even upon waking.  Limiting he activities somewhat   Taking b12 supplements by mouth daily.    Referred to cardiology:  no effusion by ECHO.  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 Has follow up this week to discuss results  MDS:  reviewed the natural history of MDS.  She was noted in June to have dropped hgb to 8.5 and was hoping for  a transfusion for hb 8.5 but never received  call from  Dr B.  Explained that the thresshold for transfusion is now < 8 for non emergent issues    ROsL  Patient denies headache, fevers, malaise, unintentional weight loss, skin rash, eye pain, sinus congestion and sinus pain, sore throat, dysphagia,  hemoptysis , cough, dyspnea, wheezing, chest pain, palpitations, orthopnea, edema, abdominal pain, nausea, melena, diarrhea, constipation, flank  pain, dysuria, hematuria, urinary  Frequency, nocturia, numbness, tingling, seizures,  Focal weakness, Loss of consciousness,  Tremor, insomnia, depression, anxiety, and suicidal ideation.    Past Medical History:  Diagnosis Date   Arthritis    Asthma    CAP (community acquired pneumonia) 08/11/2022   Depression    Dyspnea    with heat   Environmental and seasonal allergies    GERD (gastroesophageal reflux disease)    Hyperlipidemia    Hypertension    Hypothyroidism    Presence of dental prosthetic device    Dental implants   Wheezing     Past Surgical History:  Procedure Laterality Date   ABDOMINAL HYSTERECTOMY  04/26/1987   ANTERIOR CERVICAL DECOMPRESSION/DISCECTOMY FUSION 4 LEVELS N/A 11/10/2021   Procedure: C3-7 ANTERIOR CERVICAL DISCECTOMY AND FUSION (GLOBUS FORGE);  Surgeon: Venetia Night, MD;  Location: [facility] ORS;  Service: Neurosurgery;  Laterality: N/A;   BROW LIFT Bilateral 02/07/2020   Procedure: BLEPHAROPLASTY UPPER EYELID; W/EXCESS SKIN BROW PTOSIS REPAIR BILATERAL;  Surgeon: Imagene Riches, MD;  Location: [facility];  Service: Ophthalmology;  Laterality: Bilateral;   CATARACT EXTRACTION W/PHACO Right 09/13/2016   Procedure: CATARACT EXTRACTION PHACO AND INTRAOCULAR LENS PLACEMENT (IOC);  Surgeon: Galen Manila, MD;  Location: [facility] ORS;  Service: Ophthalmology;  Laterality: Right;  [place] 00:38 AP% 14.3 CDE 5.51 Fluid pack lot # [id number]   CATARACT EXTRACTION W/PHACO Left 10/11/2016   Procedure: CATARACT EXTRACTION PHACO AND INTRAOCULAR LENS PLACEMENT (IOC);  Surgeon: Galen Manila, MD;  Location: [facility] ORS;  Service: Ophthalmology;  Laterality: Left;  [place] 00:30 AP% 11.3 CDE 3.50 fluid pack lot # [id number]   COLONOSCOPY WITH PROPOFOL N/A 09/30/2014   Procedure: COLONOSCOPY WITH PROPOFOL;  Surgeon: Earline Mayotte, MD;  Location: [facility] END[facility]COPY;  Service: Endoscopy;  Laterality: N/A;   COLONOSCOPY WITH PROPOFOL N/A 03/24/2021   Procedure: COLONOSCOPY  WITH PROPOFOL;  Surgeon: Toney Reil, MD;  Location: [facility] ENDOSCOPY;  Service: Gastroenterology;  Laterality: N/A;   ESOPHAGOGASTRODUODENOSCOPY N/A 09/30/2014   Procedure: ESOPHAGOGASTRODUODENOSCOPY (EGD);  Surgeon: Earline Mayotte, MD;  Location: [facility] ENDOSCOPY;  Service: Endoscopy;  Laterality: N/A;   ESOPHAGOGASTRODUODENOSCOPY N/A 03/24/2021   Procedure: ESOPHAGOGASTRODUODENOSCOPY (EGD);  Surgeon: Toney Reil, MD;  Location: [facility] ENDOSCOPY;  Service: Gastroenterology;  Laterality: N/A;   FOOT SURGERY Bilateral 04/26/1983   KNEE SURGERY  04/25/2014    Family History  Problem Relation Age of Onset   Diabetes Mother    Hyperlipidemia Mother    Heart disease Mother    Prostate cancer Neg Hx    Kidney cancer Neg Hx    Bladder Cancer Neg Hx     SOCIAL HX:  reports that she has never smoked. She has never used smokeless tobacco. She reports that she does not drink alcohol and does not use drugs.    Current Outpatient Medications:    albuterol (VENTOLIN HFA) 108 (90 Base) MCG/ACT inhaler, Inhale 1-2 puffs into the lungs every 4 (four) hours as needed for wheezing or shortness of breath., Disp: 18 g, Rfl: 3   Calcium Citrate-Vitamin D (CALCIUM CITRATE + D PO), Take 1 tablet by mouth 2 (two) times daily., Disp: , Rfl:    citalopram (CELEXA) 20 MG tablet, TAKE 1 TABLET BY MOUTH DAILY, Disp: 90 tablet, Rfl: 3   famotidine (PEPCID) 20 MG tablet, TAKE 1 TABLET BY MOUTH DAILY. BEFORE DINNER, Disp: 30 tablet, Rfl: 11   ferrous sulfate 324 MG TBEC, Take 2 tablets by mouth. 65 mg of elemental iron, Disp: , Rfl:    fexofenadine (ALLEGRA) 180 MG tablet, Take 180 mg by mouth daily., Disp: , Rfl:    Fluticasone-Umeclidin-Vilant (TRELEGY ELLIPTA) 100-62.5-25 MCG/ACT AEPB, Inhale 1 puff into the lungs daily., Disp: 1 each, Rfl: 11   gabapentin (NEURONTIN) 100 MG capsule, Take 3 capsules (300 mg total) by mouth at bedtime. Home med., Disp: , Rfl:    levothyroxine (SYNTHROID) 50 MCG tablet,  TAKE 1 TABLET BY MOUTH DAILY ON  EMPTY STOMACH WITH A GLASS OF  WATER AT LEAST 30 TO 60 MIN  BEFORE BREAKFAST, Disp: 100 tablet, Rfl: 2   metoprolol tartrate (LOPRESSOR) 100 MG tablet, Take 1 tablet (100 mg total) by mouth once for 1 dose. TWO HOURS PRIOR TO CARDIAC CT, Disp: 1 tablet, Rfl: 0   rosuvastatin (CRESTOR) 10 MG tablet, Take 1 tablet (10 mg total) by mouth at bedtime. Home med., Disp: , Rfl:    solifenacin (VESICARE) 5 MG tablet, TAKE ONE TABLET BY MOUTH EVERY DAY, Disp: 30 tablet, Rfl: 2   traZODone (DESYREL) 50 MG tablet, TAKE 1/2 TO 1 TABLET BY MOUTH AT BEDTIME AS NEEDED FOR SLEEP, Disp: 90 tablet, Rfl: 3   vitamin B-12 (CYANOCOBALAMIN) 1000 MCG tablet, Take 1,000 mcg by mouth daily., Disp: , Rfl:    amLODipine (NORVASC) 5 MG tablet, Take 1 tablet (5 mg total) by mouth daily., Disp: 100 tablet, Rfl: 2   pantoprazole (PROTONIX) 40 MG tablet, Take 1 tablet (40 mg total) by mouth daily., Disp: 100 tablet, Rfl: 2  EXAM:  VITALS per patient if applicable:  GENERAL: alert, oriented, appears well and in no acute distress  HEENT: atraumatic, conjunttiva clear, no obvious abnormalities on inspection of external nose and ears  NECK: normal movements of the head and neck  LUNGS: on inspection no signs of respiratory distress, breathing rate appears normal, no obvious gross SOB, gasping or wheezing  CV: no obvious cyanosis  MS: moves all visible extremities without noticeable abnormality  PSYCH/NEURO: pleasant and cooperative, no obvious depression or anxiety, speech and thought processing grossly intact  ASSESSMENT AND PLAN: Prediabetes Assessment & Plan: Her  random glucose is not  elevated but her A1c suggests she is at risk for developing diabetes.  I recommend she follow a low glycemic index diet and particpate regularly in an aerobic  exercise activity.  We are checking an A1c   Orders: -     Microalbumin / creatinine urine ratio -     Comprehensive metabolic panel -      Hemoglobin A1c  Essential hypertension Assessment & Plan: Well controlled on current regimen of amlodipine 5 mg daily . Renal function stable, no changes today.  Orders: -     amLODIPine Besylate; Take 1 tablet (5 mg total) by mouth daily.  Dispense: 100 tablet; Refill: 2 -     Microalbumin / creatinine urine ratio  Hypothyroidism, unspecified type Assessment & Plan: Thyroid function is overdue for checking   Orders: -     TSH  Hypersomnia Assessment & Plan: Secondary to moderate OSA diagnosed by April 2024 home sleep study .  Reminded to follow up with Tammy parrett to monitor her compliance with CPAP.  Encouraged her to lose weight as well    Hyperlipidemia, unspecified hyperlipidemia type -     LDL cholesterol, direct -     Lipid panel  Macrocytic anemia -     CBC with Differential/Platelet -     B12 and Folate Panel  Other fatigue Assessment & Plan: Secondary to MDS and OSA , both of which are under treatment. Given the prognosis  of MDS, I encouraged her to prioritize her activities to avoid wasting her energy of tasks that do not enrich or pleasure her  (eg, goidn her grocery shopping on line, etc).   Lab Results  Component Value Date   TSH 1.47 09/15/2021   Lab Results  Component Value Date   VITAMINB12 >1500 (H) 05/18/2022   Lab Results  Component Value Date   WBC 9.5 10/11/2022   HGB 8.8 (L) 10/11/2022   HCT 26.6 (L) 10/11/2022   MCV 114.2 (H) 10/11/2022   PLT 114 (L) 10/11/2022      Other orders -     Pantoprazole Sodium; Take 1 tablet (40 mg total) by mouth daily.  Dispense: 100 tablet; Refill: 2      I discussed the assessment and treatment plan with the patient. The patient was provided an opportunity to ask questions and all were answered. The patient agreed with the plan and demonstrated an understanding of the instructions.   The patient was advised to call back or seek an in-person evaluation if the symptoms worsen or if the condition fails  to improve as anticipated.   I spent 30 minutes dedicated to the care of this patient on the date of this encounter to include pre-visit review of his medical history,  Face-to-face time with the patient , and post visit ordering of testing and therapeutics.    Sherlene Shams, MD

## 2022-11-22 ENCOUNTER — Ambulatory Visit: Payer: Self-pay

## 2022-11-22 ENCOUNTER — Other Ambulatory Visit: Payer: Self-pay | Admitting: Internal Medicine

## 2022-11-22 DIAGNOSIS — I10 Essential (primary) hypertension: Secondary | ICD-10-CM

## 2022-11-22 NOTE — Patient Outreach (Signed)
  Care Coordination   Follow Up Visit Note   11/22/2022 Name: Sarah [name] MRN: [id number] DOB: 05/25/1950  Sarah [name] is a 72 y.o. year old female who sees Sarah [name], Sarah Daring, MD for primary care. I spoke with  Sarah [name] by phone today.  What matters to the patients health and wellness today?  Patient states she is currently at a funeral for her friend.  RNCM offered condolences and to call back at another time. Patient states she was able to complete call with RNCM.  Patient states she continues to deal with tiredness/ fatigue. Patient states she had lab work done on yesterday.  Per chart review lab work completed on 11/21/22 showed Hgb 10.5.  Patient states she was pleased with this.  Patient states she is scheduled to see the oncologist/ hematologist and heart doctor within the next week. Patient denies any increase in COPD symptoms.  RNCM again offered patient condolences and scheduled patient for telephone follow up visit.     Goals Addressed             This Visit's Progress    Effective management of COPD and health conditions       Interventions Today    Flowsheet Row Most Recent Value  Chronic Disease   Chronic disease during today's visit Chronic Obstructive Pulmonary Disease (COPD), Other  [asthma]  General Interventions   General Interventions Discussed/Reviewed General Interventions Reviewed, Doctor Visits  [assessed for COPD/ asthm symptoms.  Evaluated for COPD/ asthma treatment plan and patients adherence to plan as established by provider.]  Doctor Visits Discussed/Reviewed Doctor Visits Reviewed  Sarah [name] scheduled/ upcoming provider visit appointments.]  Education Interventions   Education Provided Provided Education  [patient reports using CPAP nightly as directed.]  Provided Verbal Education On Other  [reviewed heart failure action plan/ zones.  Advised to notify provider for increase SOB, cough, chest tightness.  Call 911 for severe breathing concerns.   patient provided contact phone number for patient experience department.]  Pharmacy Interventions   Pharmacy Dicussed/Reviewed Pharmacy Topics Reviewed  [medications reviewed and compliance discussed.]               SDOH assessments and interventions completed:  No     Care Coordination Interventions:  Yes, provided   Follow up plan: Follow up call scheduled for 12/16/22    Encounter Outcome:  Pt. Visit Completed   Sarah Ina RN,BSN,CCM [facility] Care Coordination [phone] direct line

## 2022-11-22 NOTE — Patient Instructions (Signed)
Visit Information  Thank you for taking time to visit with me today. Please don't hesitate to contact me if I can be of assistance to you.   Following are the goals we discussed today:   Goals Addressed             This Visit's Progress    Effective management of COPD and health conditions       Interventions Today    Flowsheet Row Most Recent Value  Chronic Disease   Chronic disease during today's visit Chronic Obstructive Pulmonary Disease (COPD), Other  [asthma]  General Interventions   General Interventions Discussed/Reviewed General Interventions Reviewed, Doctor Visits  [assessed for COPD/ asthm symptoms.  Evaluated for COPD/ asthma treatment plan and patients adherence to plan as established by provider.]  Doctor Visits Discussed/Reviewed Doctor Visits Reviewed  [name] scheduled/ upcoming provider visit appointments.]  Education Interventions   Education Provided Provided Education  [patient reports using CPAP nightly as directed.]  Provided Verbal Education On Other  [reviewed heart failure action plan/ zones.  Advised to notify provider for increase SOB, cough, chest tightness.  Call 911 for severe breathing concerns.  patient provided contact phone number for patient experience department.]  Pharmacy Interventions   Pharmacy Dicussed/Reviewed Pharmacy Topics Reviewed  [medications reviewed and compliance discussed.]               Our next appointment is by telephone on 12/16/22 at 10:30 am  Please call the care guide team at [phone] if you need to cancel or reschedule your appointment.   If you are experiencing a Mental Health or Behavioral Health Crisis or need someone to talk to, please call the Suicide and Crisis Lifeline: 988 call 1-800-273-TALK (toll free, 24 hour hotline)  Patient verbalizes understanding of instructions and care plan provided today and agrees to view in MyChart. Active MyChart status and patient understanding of how to access  instructions and care plan via MyChart confirmed with patient.     [name] RN,BSN,CCM [facility] Care Coordination [phone] direct line

## 2022-11-23 ENCOUNTER — Other Ambulatory Visit: Payer: Self-pay | Admitting: Internal Medicine

## 2022-11-23 ENCOUNTER — Encounter: Payer: Self-pay | Admitting: Medical

## 2022-11-23 ENCOUNTER — Ambulatory Visit: Payer: Medicare Other | Attending: Medical | Admitting: Medical

## 2022-11-23 VITALS — BP 122/60 | HR 79 | Resp 18 | Ht 63.0 in | Wt 180.8 lb

## 2022-11-23 DIAGNOSIS — M5451 Vertebrogenic low back pain: Secondary | ICD-10-CM | POA: Diagnosis not present

## 2022-11-23 DIAGNOSIS — I25118 Atherosclerotic heart disease of native coronary artery with other forms of angina pectoris: Secondary | ICD-10-CM | POA: Diagnosis not present

## 2022-11-23 DIAGNOSIS — E782 Mixed hyperlipidemia: Secondary | ICD-10-CM | POA: Diagnosis not present

## 2022-11-23 DIAGNOSIS — M542 Cervicalgia: Secondary | ICD-10-CM | POA: Diagnosis not present

## 2022-11-23 DIAGNOSIS — M4322 Fusion of spine, cervical region: Secondary | ICD-10-CM | POA: Diagnosis not present

## 2022-11-23 DIAGNOSIS — I10 Essential (primary) hypertension: Secondary | ICD-10-CM

## 2022-11-23 DIAGNOSIS — M5412 Radiculopathy, cervical region: Secondary | ICD-10-CM | POA: Diagnosis not present

## 2022-11-23 DIAGNOSIS — I5032 Chronic diastolic (congestive) heart failure: Secondary | ICD-10-CM

## 2022-11-23 NOTE — Patient Instructions (Signed)
Medication Instructions:  Your physician recommends that you continue on your current medications as directed. Please refer to the Current Medication list given to you today.   *If you need a refill on your cardiac medications before your next appointment, please call your pharmacy*   Lab Work: None ordered today   Testing/Procedures: None ordered today   Follow-Up: At [facility], you and your health needs are our priority.  As part of our continuing mission to provide you with exceptional heart care, we have created designated Provider Care Teams.  These Care Teams include your primary Cardiologist (physician) and Advanced Practice Providers (APPs -  Physician Assistants and Nurse Practitioners) who all work together to provide you with the care you need, when you need it.  We recommend signing up for the patient portal called "MyChart".  Sign up information is provided on this After Visit Summary.  MyChart is used to connect with patients for Virtual Visits (Telemedicine).  Patients are able to view lab/test results, encounter notes, upcoming appointments, etc.  Non-urgent messages can be sent to your provider as well.   To learn more about what you can do with MyChart, go to [email or web address].    Your next appointment:   3 month(s)  Provider:   You may see Debbe Odea, MD or one of the following Advanced Practice Providers on your designated Care Team:   Nicolasa Ducking, NP Eula Listen, PA-C Cadence Fransico Michael, PA-C Charlsie Quest, NP

## 2022-11-23 NOTE — Progress Notes (Signed)
Cardiology Office Note:    Date:  11/23/2022   ID:  Sarah [name], DOB 1950-09-11, MRN [id number]  PCP:  Sherlene Shams, MD  [facility] Cardiologist:  Debbe Odea, MD  [facility] Electrophysiologist:  None   Referring MD: Sherlene Shams, MD   Chief Complaint: 1 month follow-up  History of Present Illness:    Sarah [name] is a 72 y.o. female with a hx of hypertension, hyperlipidemia, hypothyroidism, MDS and macrocytic anemia, asthma who presents for 1 month follow-up.  Echo in 2020 showed EF of 60 to 65%.  Lexiscan Myoview in 2020 was low risk with no ischemia.  Patient was seen as a new patient in June 2024 for shortness of breath.  Patient had been admitted to the hospital 2 months prior with viral pneumonia.  She denied any chest pain.  Echocardiogram and cardiac CT were ordered.  Cardiac CTA showed a coronary calcium score 119, 60th percentile for age and sex matched, mild proximal LAD stenosis 25 to 49%, overall mild nonobstructive CAD.  Today, the patient reports persistent shortness of breath. Echo and cardiac CTA were reviewed. The patient reports SOB on exertion. She uses CPAP. NO lower leg edema, orthopnea or pnd. She has MDS and most recent Hgb 10.5 and plt 98.  Past Medical History:  Diagnosis Date   Arthritis    Asthma    CAP (community acquired pneumonia) 08/11/2022   Depression    Dyspnea    with heat   Environmental and seasonal allergies    GERD (gastroesophageal reflux disease)    Hyperlipidemia    Hypertension    Hypothyroidism    Presence of dental prosthetic device    Dental implants   Wheezing     Past Surgical History:  Procedure Laterality Date   ABDOMINAL HYSTERECTOMY  04/26/1987   ANTERIOR CERVICAL DECOMPRESSION/DISCECTOMY FUSION 4 LEVELS N/A 11/10/2021   Procedure: C3-7 ANTERIOR CERVICAL DISCECTOMY AND FUSION (GLOBUS FORGE);  Surgeon: Venetia Night, MD;  Location: [facility];  Service: Neurosurgery;  Laterality: N/A;    BROW LIFT Bilateral 02/07/2020   Procedure: BLEPHAROPLASTY UPPER EYELID; W/EXCESS SKIN BROW PTOSIS REPAIR BILATERAL;  Surgeon: Imagene Riches, MD;  Location: [facility];  Service: Ophthalmology;  Laterality: Bilateral;   CATARACT EXTRACTION W/PHACO Right 09/13/2016   Procedure: CATARACT EXTRACTION PHACO AND INTRAOCULAR LENS PLACEMENT (IOC);  Surgeon: Galen Manila, MD;  Location: [facility];  Service: Ophthalmology;  Laterality: Right;  [place] 00:38 AP% 14.3 CDE 5.51 Fluid pack lot # [id number]   CATARACT EXTRACTION W/PHACO Left 10/11/2016   Procedure: CATARACT EXTRACTION PHACO AND INTRAOCULAR LENS PLACEMENT (IOC);  Surgeon: Galen Manila, MD;  Location: [facility];  Service: Ophthalmology;  Laterality: Left;  [place] 00:30 AP% 11.3 CDE 3.50 fluid pack lot # [id number]   COLONOSCOPY WITH PROPOFOL N/A 09/30/2014   Procedure: COLONOSCOPY WITH PROPOFOL;  Surgeon: Earline Mayotte, MD;  Location: [facility];  Service: Endoscopy;  Laterality: N/A;   COLONOSCOPY WITH PROPOFOL N/A 03/24/2021   Procedure: COLONOSCOPY WITH PROPOFOL;  Surgeon: Toney Reil, MD;  Location: [facility] END[facility];  Service: Gastroenterology;  Laterality: N/A;   ESOPHAGOGASTRODUODENOSCOPY N/A 09/30/2014   Procedure: ESOPHAGOGASTRODUODENOSCOPY (EGD);  Surgeon: Earline Mayotte, MD;  Location: [facility] END[facility];  Service: Endoscopy;  Laterality: N/A;   ESOPHAGOGASTRODUODENOSCOPY N/A 03/24/2021   Procedure: ESOPHAGOGASTRODUODENOSCOPY (EGD);  Surgeon: Toney Reil, MD;  Location: [facility];  Service: Gastroenterology;  Laterality: N/A;   FOOT SURGERY Bilateral 04/26/1983   KNEE SURGERY  04/25/2014  Current Medications: Current Meds  Medication Sig   albuterol (VENTOLIN HFA) 108 (90 Base) MCG/ACT inhaler Inhale 1-2 puffs into the lungs every 4 (four) hours as needed for wheezing or shortness of breath.   amLODipine (NORVASC) 5 MG tablet TAKE 1 TABLET BY MOUTH DAILY   Calcium Citrate-Vitamin D (CALCIUM  CITRATE + D PO) Take 1 tablet by mouth 2 (two) times daily.   citalopram (CELEXA) 20 MG tablet TAKE 1 TABLET BY MOUTH DAILY   famotidine (PEPCID) 20 MG tablet TAKE 1 TABLET BY MOUTH DAILY. BEFORE DINNER   ferrous sulfate 324 MG TBEC Take 2 tablets by mouth. 65 mg of elemental iron   fexofenadine (ALLEGRA) 180 MG tablet Take 180 mg by mouth daily.   Fluticasone-Umeclidin-Vilant (TRELEGY ELLIPTA) 100-62.5-25 MCG/ACT AEPB Inhale 1 puff into the lungs daily.   gabapentin (NEURONTIN) 100 MG capsule Take 3 capsules (300 mg total) by mouth at bedtime. Home med.   levothyroxine (SYNTHROID) 50 MCG tablet TAKE 1 TABLET BY MOUTH DAILY ON  EMPTY STOMACH WITH A GLASS OF  WATER AT LEAST 30 TO 60 MIN  BEFORE BREAKFAST   pantoprazole (PROTONIX) 40 MG tablet Take 1 tablet (40 mg total) by mouth daily.   rosuvastatin (CRESTOR) 10 MG tablet Take 1 tablet (10 mg total) by mouth at bedtime. Home med.   solifenacin (VESICARE) 5 MG tablet TAKE ONE TABLET BY MOUTH EVERY DAY   traZODone (DESYREL) 50 MG tablet TAKE 1/2 TO 1 TABLET BY MOUTH AT BEDTIME AS NEEDED FOR SLEEP   vitamin B-12 (CYANOCOBALAMIN) 1000 MCG tablet Take 1,000 mcg by mouth daily.     Allergies:   Patient has no known allergies.   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 No smoking; no alcohol. Lives in [place], [place] lives self/kitty.    Social Determinants of Health   Financial Resource Strain: Low Risk  (05/12/2022)   Overall Financial Resource  Strain (CARDIA)    Difficulty of Paying Living Expenses: Not very hard  Food Insecurity: No Food Insecurity (09/02/2022)   Hunger Vital Sign    Worried About Running Out of Food in the Last Year: Never true    Ran Out of Food in the Last Year: Never true  Transportation Needs: No Transportation Needs (09/02/2022)   PRAPARE - Administrator, Civil Service (Medical): No    Lack of Transportation (Non-Medical): No  Physical Activity: Insufficiently Active (05/12/2022)   Exercise Vital Sign    Days of Exercise per Week: 1 day    Minutes of Exercise per Session: 60 min  Stress: No Stress Concern Present (05/12/2022)   [employer] of Occupational Health - Occupational Stress Questionnaire    Feeling of Stress : Not at all  Social Connections: Unknown (05/12/2022)   Social Connection and Isolation Panel [NHANES]    Frequency of Communication with Friends and Family: More than  three times a week    Frequency of Social Gatherings with Friends and Family: More than three times a week    Attends Religious Services: More than 4 times per year    Active Member of Clubs or Organizations: Yes    Attends Engineer, structural: More than 4 times per year    Marital Status: Not on file     Family History: The patient's family history includes Diabetes in her mother; Heart disease in her mother; Hyperlipidemia in her mother. There is no history of Prostate cancer, Kidney cancer, or Bladder Cancer.  ROS:   Please see the history of present illness.     All other systems reviewed and are negative.  EKGs/Labs/Other Studies Reviewed:    The following studies were reviewed today:  Cardiac CT 10/2022 IMPRESSION: 1. Coronary calcium score of 119. This was 68th percentile for age and sex matched control.   2. Normal coronary origin with right dominance.   3. Mild proximal LAD stenosis (25-49%).   4. CAD-RADS 2. Mild non-obstructive CAD (25-49%). Consider non-atherosclerotic  causes of chest pain. Consider preventive therapy and risk factor modification.   Echo 10/2022 1. Left ventricular ejection fraction, by estimation, is 55 to 60%. Left  ventricular ejection fraction by 2D MOD biplane is 58.5 %. The left  ventricle has normal function. The left ventricle has no regional wall  motion abnormalities. Left ventricular  diastolic parameters are consistent with Grade II diastolic dysfunction  (pseudonormalization).   2. Right ventricular systolic function is normal. The right ventricular  size is normal. There is mildly elevated pulmonary artery systolic  pressure.   3. Left atrial size was mildly dilated.   4. The mitral valve is normal in structure. Mild mitral valve  regurgitation.   5. The aortic valve was not well visualized. Aortic valve regurgitation  is not visualized. Aortic valve mean gradient measures 6.0 mmHg.   6. The inferior vena cava is normal in size with <50% respiratory  variability, suggesting right atrial pressure of 8 mmHg.   EKG:  EKG is not ordered today.    Recent Labs: 08/19/2022: Magnesium 2.2 11/21/2022: ALT 13; BUN 17; Creatinine, Ser 0.77; Hemoglobin 10.5; Platelets 98.0 Repeated and verified X2.; Potassium 4.1; Sodium 140; TSH 0.82  Recent Lipid Panel    Component Value Date/Time   CHOL 152 11/21/2022 1406   TRIG 58.0 11/21/2022 1406   HDL 72.40 11/21/2022 1406   CHOLHDL 2 11/21/2022 1406   VLDL 11.6 11/21/2022 1406   LDLCALC 68 11/21/2022 1406   LDLCALC 142 (H) 04/06/2018 1520   LDLDIRECT 58.0 11/21/2022 1406     Physical Exam:    VS:  BP 122/60 (BP Location: Left Arm, Patient Position: Sitting, Cuff Size: Normal)   Pulse 79   Resp 18   Ht 5\' 3"  (1.6 m)   Wt 180 lb 12.8 oz (82 kg)   SpO2 95%   BMI 32.03 kg/m     Wt Readings from Last 3 Encounters:  11/23/22 180 lb 12.8 oz (82 kg)  11/21/22 179 lb 12.8 oz (81.6 kg)  10/14/22 179 lb 12.8 oz (81.6 kg)     GEN:  Well nourished, well developed in no acute  distress HEENT: Normal NECK: No JVD; No carotid bruits LYMPHATICS: No lymphadenopathy CARDIAC: RRR, no murmurs, rubs, gallops RESPIRATORY:  Clear to auscultation without rales, wheezing or rhonchi  ABDOMEN: Soft, non-tender, non-distended MUSCULOSKELETAL:  No edema; No deformity  SKIN: Warm and dry NEUROLOGIC:  Alert  and oriented x 3 PSYCHIATRIC:  Normal affect   ASSESSMENT:    1. Coronary artery disease involving native coronary artery of native heart with other form of angina pectoris (HCC)   2. Chronic diastolic heart failure (HCC)   3. Essential hypertension   4. Hyperlipidemia, mixed    PLAN:    In order of problems listed above:  Nonobstructive CAD Recent Cardia CTA showed mild nonobstructive CAD. Patient reports persistent DOE, no chest pain. I suspect MDS/anemia/thrombocytopenia contributing to symptoms. I will defer ASA given prior issues. Continue Crestor. No further ischemic work-up indicated at this time.  Diastolic dysfunction Recent echo showed preserved EF with G2DD. Patient reports dependent edema. We discussed low salt diet, compression socks and leg elevation. Can consider addition of BB in the future.   HTN BP is good, continue amlodipine 5mg  daily.   HLD LDL 58. Continue Crestor 10mg  daily.   Disposition: Follow up in 3 month(s) with MD/APP    Signed, Velma Hanna David Stall, PA-C  11/23/2022 11:58 AM    [facility]

## 2022-11-25 ENCOUNTER — Encounter: Payer: Self-pay | Admitting: Internal Medicine

## 2022-11-25 ENCOUNTER — Inpatient Hospital Stay: Payer: Medicare Other | Admitting: Internal Medicine

## 2022-11-25 ENCOUNTER — Inpatient Hospital Stay: Payer: Medicare Other | Attending: Internal Medicine

## 2022-11-25 DIAGNOSIS — E039 Hypothyroidism, unspecified: Secondary | ICD-10-CM | POA: Diagnosis not present

## 2022-11-25 DIAGNOSIS — D649 Anemia, unspecified: Secondary | ICD-10-CM | POA: Diagnosis not present

## 2022-11-25 DIAGNOSIS — M2550 Pain in unspecified joint: Secondary | ICD-10-CM | POA: Insufficient documentation

## 2022-11-25 DIAGNOSIS — D469 Myelodysplastic syndrome, unspecified: Secondary | ICD-10-CM | POA: Diagnosis not present

## 2022-11-25 DIAGNOSIS — E785 Hyperlipidemia, unspecified: Secondary | ICD-10-CM | POA: Insufficient documentation

## 2022-11-25 DIAGNOSIS — Z833 Family history of diabetes mellitus: Secondary | ICD-10-CM | POA: Diagnosis not present

## 2022-11-25 DIAGNOSIS — Z8349 Family history of other endocrine, nutritional and metabolic diseases: Secondary | ICD-10-CM | POA: Diagnosis not present

## 2022-11-25 DIAGNOSIS — Z79890 Hormone replacement therapy (postmenopausal): Secondary | ICD-10-CM | POA: Insufficient documentation

## 2022-11-25 DIAGNOSIS — D696 Thrombocytopenia, unspecified: Secondary | ICD-10-CM | POA: Diagnosis not present

## 2022-11-25 DIAGNOSIS — I10 Essential (primary) hypertension: Secondary | ICD-10-CM | POA: Insufficient documentation

## 2022-11-25 DIAGNOSIS — Z79899 Other long term (current) drug therapy: Secondary | ICD-10-CM | POA: Diagnosis not present

## 2022-11-25 DIAGNOSIS — Z8249 Family history of ischemic heart disease and other diseases of the circulatory system: Secondary | ICD-10-CM | POA: Insufficient documentation

## 2022-11-25 DIAGNOSIS — D46Z Other myelodysplastic syndromes: Secondary | ICD-10-CM | POA: Diagnosis not present

## 2022-11-25 DIAGNOSIS — Z90710 Acquired absence of both cervix and uterus: Secondary | ICD-10-CM | POA: Diagnosis not present

## 2022-11-25 DIAGNOSIS — R5383 Other fatigue: Secondary | ICD-10-CM | POA: Diagnosis not present

## 2022-11-25 LAB — CBC WITH DIFFERENTIAL (CANCER CENTER ONLY)
Abs Immature Granulocytes: 0.06 10*3/uL (ref 0.00–0.07)
Basophils Absolute: 0.1 10*3/uL (ref 0.0–0.1)
Basophils Relative: 3 %
Eosinophils Absolute: 0.2 10*3/uL (ref 0.0–0.5)
Eosinophils Relative: 3 %
HCT: 29.3 % — ABNORMAL LOW (ref 36.0–46.0)
Hemoglobin: 9.8 g/dL — ABNORMAL LOW (ref 12.0–15.0)
Immature Granulocytes: 1 %
Lymphocytes Relative: 34 %
Lymphs Abs: 1.5 10*3/uL (ref 0.7–4.0)
MCH: 37.8 pg — ABNORMAL HIGH (ref 26.0–34.0)
MCHC: 33.4 g/dL (ref 30.0–36.0)
MCV: 113.1 fL — ABNORMAL HIGH (ref 80.0–100.0)
Monocytes Absolute: 0.4 10*3/uL (ref 0.1–1.0)
Monocytes Relative: 8 %
Neutro Abs: 2.3 10*3/uL (ref 1.7–7.7)
Neutrophils Relative %: 51 %
Platelet Count: 117 10*3/uL — ABNORMAL LOW (ref 150–400)
RBC: 2.59 MIL/uL — ABNORMAL LOW (ref 3.87–5.11)
RDW: 13.6 % (ref 11.5–15.5)
WBC Count: 4.5 10*3/uL (ref 4.0–10.5)
nRBC: 0 % (ref 0.0–0.2)

## 2022-11-25 LAB — CMP (CANCER CENTER ONLY)
ALT: 15 U/L (ref 0–44)
AST: 16 U/L (ref 15–41)
Albumin: 4 g/dL (ref 3.5–5.0)
Alkaline Phosphatase: 52 U/L (ref 38–126)
Anion gap: 8 (ref 5–15)
BUN: 18 mg/dL (ref 8–23)
CO2: 24 mmol/L (ref 22–32)
Calcium: 9.1 mg/dL (ref 8.9–10.3)
Chloride: 105 mmol/L (ref 98–111)
Creatinine: 0.74 mg/dL (ref 0.44–1.00)
GFR, Estimated: >60 mL/min (ref >60)
Glucose, Bld: 151 mg/dL — ABNORMAL HIGH (ref 70–99)
Potassium: 3.7 mmol/L (ref 3.5–5.1)
Sodium: 137 mmol/L (ref 135–145)
Total Bilirubin: 0.5 mg/dL (ref 0.3–1.2)
Total Protein: 6.8 g/dL (ref 6.5–8.1)

## 2022-11-25 LAB — LACTATE DEHYDROGENASE: LDH: 131 U/L (ref 98–192)

## 2022-11-25 NOTE — Progress Notes (Signed)
Was told by cardiology to ask you about starting an 81 mg aspirin.

## 2022-11-25 NOTE — Progress Notes (Signed)
[facility] CONSULT NOTE  Patient Care Team: Sherlene Shams, MD as PCP - General (Internal Medicine) Debbe Odea, MD as PCP - Cardiology (Cardiology) Sherlene Shams, MD (Internal Medicine) Lemar Livings Merrily Pew, MD (General Surgery) Earna Coder, MD as Consulting Physician (Hematology and Oncology) Otho Ket, RN as [facility] Care Management  CHIEF COMPLAINTS/PURPOSE OF CONSULTATION: MDS  HEMATOLOGY HISTORY: Oncology History Overview Note  DEC 2023- DIAGNOSIS: LOW GRADE MDS- 5 Q DEL;   # MACROCYTIC ANEMIA [since 2022- April hb 11; MCV-103; platelet- 120s] EGD/ colonoscopy >>> 10 years ago; OCT 2022-slight improvement blood counts/stable; hepatitis HIV negative.  Myeloma panel negative.   BONE MARROW, ASPIRATE, CLOT, CORE: -Hypercellular bone marrow for age with dyspoietic changes -See comment  PERIPHERAL BLOOD: -Macrocytic anemia -Thrombocytopenia  COMMENT:  The overall findings are very concerning for involvement by a low-grade myelodysplastic syndrome.  The differential diagnosis includes changes related to nutritional deficiency, medication, immune mediated process, infection, etc. Correlation with cytogenetic and FISH studies is recommended.  MICROSCOPIC DESCRIPTION:  PERIPHERAL BLOOD SMEAR: The red blood cells display mild anisopoikilocytosis with mild polychromasia.  The white blood cells are normal in number with occasional hypogranular and/or hypolobated neutrophils.  Scattered reactive appearing lymphocytes are present.  The platelets are decreased in number.  BONE MARROW ASPIRATE: Bone marrow particles present. Erythroid precursors: Progressive maturation with occasional late precursors displaying nuclear cytoplasmic dyssynchrony. Granulocytic precursors: Progressive maturation with many mature neutrophils displaying hypogranulation and/or hypolobation.  No increase in blastic cells identified. Megakaryocytes:  Abundant with many small and/or hypolobated/unilobated forms Lymphocytes/plasma cells: Large aggregates not present  TOUCH PREPARATIONS: A mixture of cell types present.  CLOT AND BIOPSY: The sections show variable cellularity ranging from 10% focally to 70% with a mixture of cell types.  Expansile sheets of blastic cells are not identified.  A small interstitial lymphoid aggregate composed of small lymphoid cells is seen.  IRON STAIN: Iron stains are performed on a bone marrow aspirate or touch imprint smear and section of clot. The controls stained appropriately.       Storage Iron:  Abundant      Ring Sideroblasts: Absent  ADDITIONAL DATA/TESTING: The specimen was sent for cytogenetic analysis and FISH for MDS and a separate report will follow. Flow cytometric analysis ([id number]) was performed and shows T cells with nonspecific changes.  No monoclonal B-cell population or significant CD34 positive blastic population identified.     #Incidental right hepatic lobe 13 mm hypoechoic lesion [ultrasound-October 2022]-MRI OCT 2022-   Low clinical concern for metastatic disease; however need to rule out primary HCC versus others.  1. There are 3 small liver lesions, 2 of which represent simple cysts. 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 MAY 2023-abdominal MRI -benign cyst no further surveillance recommended.   #October 2022-ultrasound incidental bilateral mild to moderate hydronephrosis.-Rs/p evaluation with Dr.Brandon urology.   MDS (myelodysplastic syndrome), low grade (HCC)  04/20/2022 Initial Diagnosis   MDS (myelodysplastic syndrome), low grade (HCC)    HISTORY OF PRESENTING ILLNESS: Ambulating independently.  With friend.   Sarah [name] 72 y.o.  female is here for follow-up today re: low grade MDS with 5 q del is here for a follow up.  as told by cardiology to ask you about starting an 81 mg  aspirin.  Patient denies any worsening shortness of breath. Denies any blood in stools or black-colored stools.  Patient denies any  nausea vomiting.  Chronic mild fatigue.  Chronic mild easy bruising.  No tremors.  Review of Systems  Constitutional:  Positive for malaise/fatigue. Negative for chills, diaphoresis, fever and weight loss.  HENT:  Negative for nosebleeds and sore throat.   Eyes:  Negative for double vision.  Respiratory:  Negative for cough, hemoptysis, sputum production, shortness of breath and wheezing.   Cardiovascular:  Negative for chest pain, palpitations, orthopnea and leg swelling.  Gastrointestinal:  Negative for abdominal pain, blood in stool, constipation, diarrhea, heartburn, melena, nausea and vomiting.  Genitourinary:  Negative for dysuria, frequency and urgency.  Musculoskeletal:  Positive for joint pain. Negative for back pain.  Skin: Negative.  Negative for itching and rash.  Neurological:  Negative for dizziness, tingling, focal weakness, weakness and headaches.  Endo/Heme/Allergies:  Bruises/bleeds easily.  Psychiatric/Behavioral:  Negative for depression. The patient is not nervous/anxious and does not have insomnia.     MEDICAL HISTORY:  Past Medical History:  Diagnosis Date   Arthritis    Asthma    CAP (community acquired pneumonia) 08/11/2022   Depression    Dyspnea    with heat   Environmental and seasonal allergies    GERD (gastroesophageal reflux disease)    Hyperlipidemia    Hypertension    Hypothyroidism    Presence of dental prosthetic device    Dental implants   Wheezing     SURGICAL HISTORY: Past Surgical History:  Procedure Laterality Date   ABDOMINAL HYSTERECTOMY  04/26/1987   ANTERIOR CERVICAL DECOMPRESSION/DISCECTOMY FUSION 4 LEVELS N/A 11/10/2021   Procedure: C3-7 ANTERIOR CERVICAL DISCECTOMY AND FUSION (GLOBUS FORGE);  Surgeon: Venetia Night, MD;  Location: [facility] ORS;  Service: Neurosurgery;  Laterality: N/A;   BROW LIFT  Bilateral 02/07/2020   Procedure: BLEPHAROPLASTY UPPER EYELID; W/EXCESS SKIN BROW PTOSIS REPAIR BILATERAL;  Surgeon: Imagene Riches, MD;  Location: [facility];  Service: Ophthalmology;  Laterality: Bilateral;   CATARACT EXTRACTION W/PHACO Right 09/13/2016   Procedure: CATARACT EXTRACTION PHACO AND INTRAOCULAR LENS PLACEMENT (IOC);  Surgeon: Galen Manila, MD;  Location: [facility] ORS;  Service: Ophthalmology;  Laterality: Right;  [place] 00:38 AP% 14.3 CDE 5.51 Fluid pack lot # [id number]   CATARACT EXTRACTION W/PHACO Left 10/11/2016   Procedure: CATARACT EXTRACTION PHACO AND INTRAOCULAR LENS PLACEMENT (IOC);  Surgeon: Galen Manila, MD;  Location: [facility] ORS;  Service: Ophthalmology;  Laterality: Left;  [place] 00:30 AP% 11.3 CDE 3.50 fluid pack lot # [id number]   COLONOSCOPY WITH PROPOFOL N/A 09/30/2014   Procedure: COLONOSCOPY WITH PROPOFOL;  Surgeon: Earline Mayotte, MD;  Location: [facility] END[facility];  Service: Endoscopy;  Laterality: N/A;   COLONOSCOPY WITH PROPOFOL N/A 03/24/2021   Procedure: COLONOSCOPY WITH PROPOFOL;  Surgeon: Toney Reil, MD;  Location: [facility] ENDOSCOPY;  Service: Gastroenterology;  Laterality: N/A;   ESOPHAGOGASTRODUODENOSCOPY N/A 09/30/2014   Procedure: ESOPHAGOGASTRODUODENOSCOPY (EGD);  Surgeon: Earline Mayotte, MD;  Location: [facility] ENDOSCOPY;  Service: Endoscopy;  Laterality: N/A;   ESOPHAGOGASTRODUODENOSCOPY N/A 03/24/2021   Procedure: ESOPHAGOGASTRODUODENOSCOPY (EGD);  Surgeon: Toney Reil, MD;  Location: [facility] ENDOSCOPY;  Service: Gastroenterology;  Laterality: N/A;   FOOT SURGERY Bilateral 04/26/1983   KNEE SURGERY  04/25/2014    SOCIAL HISTORY: Social History   Socioeconomic History   Marital status: Single    Spouse name: Not on file   Number of children: Not on file   Years of education: Not on file   Highest education level: Not on file  Occupational History   Not on file  Tobacco Use  Smoking status: Never   Smokeless tobacco: Never   Vaping Use   Vaping status: Never Used  Substance and Sexual Activity   Alcohol use: No   Drug use: No   Sexual activity: Never  Other Topics Concern   Not on file  Social History Narrative   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 No smoking; no alcohol. Lives in [place], [place] lives self/kitty.    Social Determinants of Health   Financial Resource Strain: Low Risk  (05/12/2022)   Overall Financial Resource Strain (CARDIA)    Difficulty of Paying Living Expenses: Not very hard  Food Insecurity: No Food Insecurity (09/02/2022)   Hunger Vital Sign    Worried About Running Out of Food in the Last Year: Never true    Ran Out of Food in the Last Year: Never true  Transportation Needs: No Transportation Needs (09/02/2022)   PRAPARE - Administrator, Civil Service (Medical): No    Lack of Transportation (Non-Medical): No  Physical Activity: Insufficiently Active (05/12/2022)   Exercise Vital Sign    Days of Exercise per Week: 1 day    Minutes of Exercise per Session: 60 min  Stress: No Stress Concern Present (05/12/2022)   [employer] of Occupational Health - Occupational Stress Questionnaire    Feeling of Stress : Not at all  Social Connections: Unknown (05/12/2022)   Social Connection and Isolation Panel [NHANES]    Frequency of Communication with Friends and Family: More than three times a week    Frequency of Social Gatherings with Friends and Family: More than three times a week    Attends Religious Services: More than 4 times per year    Active Member of [employer] or Organizations: Yes    Attends Banker Meetings: More than 4 times per year    Marital Status: Not on file  Intimate Partner Violence: Not At Risk (08/15/2022)   Humiliation, Afraid, Rape, and Kick questionnaire    Fear of Current or Ex-Partner: No    Emotionally Abused: No    Physically Abused: No    Sexually Abused: No    FAMILY HISTORY: Family History  Problem Relation Age of  Onset   Diabetes Mother    Hyperlipidemia Mother    Heart disease Mother    Prostate cancer Neg Hx    Kidney cancer Neg Hx    Bladder Cancer Neg Hx     ALLERGIES:  has No Known Allergies.  MEDICATIONS:  Current Outpatient Medications  Medication Sig Dispense Refill   albuterol (VENTOLIN HFA) 108 (90 Base) MCG/ACT inhaler Inhale 1-2 puffs into the lungs every 4 (four) hours as needed for wheezing or shortness of breath. 18 g 3   amLODipine (NORVASC) 5 MG tablet TAKE 1 TABLET BY MOUTH DAILY 100 tablet 2   Calcium Citrate-Vitamin D (CALCIUM CITRATE + D PO) Take 1 tablet by mouth 2 (two) times daily.     citalopram (CELEXA) 20 MG tablet TAKE 1 TABLET BY MOUTH DAILY 90 tablet 3   famotidine (PEPCID) 20 MG tablet TAKE 1 TABLET BY MOUTH DAILY. BEFORE DINNER 30 tablet 11   ferrous sulfate 324 MG TBEC Take 2 tablets by mouth. 65 mg of elemental iron     fexofenadine (ALLEGRA) 180 MG tablet Take 180 mg by mouth daily.     Fluticasone-Umeclidin-Vilant (TRELEGY ELLIPTA) 100-62.5-25 MCG/ACT AEPB Inhale 1 puff into the lungs daily. 1 each 11   gabapentin (NEURONTIN) 100 MG capsule Take 3 capsules (300 mg  total) by mouth at bedtime. Home med.     levothyroxine (SYNTHROID) 50 MCG tablet TAKE 1 TABLET BY MOUTH DAILY ON  EMPTY STOMACH WITH A GLASS OF  WATER AT LEAST 30 TO 60 MINUTES  BEFORE BREAKFAST 100 tablet 2   pantoprazole (PROTONIX) 40 MG tablet TAKE 1 TABLET BY MOUTH DAILY 100 tablet 2   rosuvastatin (CRESTOR) 10 MG tablet Take 1 tablet (10 mg total) by mouth at bedtime. Home med.     solifenacin (VESICARE) 5 MG tablet TAKE ONE TABLET BY MOUTH EVERY DAY 30 tablet 2   traZODone (DESYREL) 50 MG tablet TAKE 1/2 TO 1 TABLET BY MOUTH AT BEDTIME AS NEEDED FOR SLEEP 90 tablet 3   vitamin B-12 (CYANOCOBALAMIN) 1000 MCG tablet Take 1,000 mcg by mouth daily.     metoprolol tartrate (LOPRESSOR) 100 MG tablet Take 1 tablet (100 mg total) by mouth once for 1 dose. TWO HOURS PRIOR TO CARDIAC CT 1 tablet 0   No  current facility-administered medications for this visit.      PHYSICAL EXAMINATION:   Vitals:   11/25/22 1410  BP: (!) 126/58  Pulse: 79  Temp: 97.8 F (36.6 C)  SpO2: 98%     Filed Weights   11/25/22 1410  Weight: 181 lb 12.8 oz (82.5 kg)      Physical Exam Vitals and nursing note reviewed.  HENT:     Head: Normocephalic and atraumatic.     Mouth/Throat:     Pharynx: Oropharynx is clear.  Eyes:     Extraocular Movements: Extraocular movements intact.     Pupils: Pupils are equal, round, and reactive to light.  Cardiovascular:     Rate and Rhythm: Normal rate and regular rhythm.  Pulmonary:     Comments: Decreased breath sounds bilaterally.  Abdominal:     Palpations: Abdomen is soft.  Musculoskeletal:        General: Normal range of motion.     Cervical back: Normal range of motion.  Skin:    General: Skin is warm.  Neurological:     General: No focal deficit present.     Mental Status: She is alert and oriented to person, place, and time.  Psychiatric:        Behavior: Behavior normal.        Judgment: Judgment normal.     LABORATORY DATA:  I have reviewed the data as listed Lab Results  Component Value Date   WBC 4.5 11/25/2022   HGB 9.8 (L) 11/25/2022   HCT 29.3 (L) 11/25/2022   MCV 113.1 (H) 11/25/2022   PLT 117 (L) 11/25/2022   Recent Labs    08/15/22 2146 08/16/22 0644 08/18/22 0602 08/19/22 0540 10/14/22 1253 11/21/22 1406 11/25/22 1411  NA  --  140   < > 139 137 140 137  K  --  3.4*   < > 3.5 4.4 4.1 3.7  CL  --  105   < > 105 103 104 105  CO2  --  28   < > 30 27 29 24   GLUCOSE  --  105*   < > 120* 83 69* 151*  BUN  --  17   < > 27* 18 17 18   CREATININE 0.85 0.68   < > 0.71 0.75 0.77 0.74  CALCIUM  --  8.4*   < > 8.1* 9.3 9.8 9.1  GFRNONAA >60 >60   < > >60 >60  --  >60  PROT 7.1 6.1*  --   --   --  6.7 6.8  ALBUMIN 3.7 3.2*  --   --   --  4.5 4.0  AST 29 22  --   --   --  11 16  ALT 35 33  --   --   --  13 15  ALKPHOS 69  59  --   --   --  59 52  BILITOT 0.5 0.5  --   --   --  0.5 0.5  BILIDIR <0.1  --   --   --   --   --   --   IBILI NOT CALCULATED  --   --   --   --   --   --    < > = values in this interval not displayed.     CT CORONARY MORPH W/CTA COR W/SCORE W/CA W/CM &/OR WO/CM  Result Date: 11/17/2022 CLINICAL DATA:  Dyspnea on exertion EXAM: Cardiac/Coronary  CTA TECHNIQUE: The patient was scanned on a Siemens Somatom go.Top scanner. : A retrospective scan was triggered in the ascending thoracic aorta. Axial non-contrast 3 mm slices were carried out through the heart. The data set was analyzed on a dedicated work station and scored using the Agatson method. Gantry rotation speed was 330 msecs and collimation was .6 mm. 100mg  of metoprolol and 0.8 mg of sl NTG was given. The 3D data set was reconstructed in 5% intervals of the 60-95 % of the R-R cycle. Diastolic phases were analyzed on a dedicated work station using MPR, MIP and VRT modes. The patient received 100 cc of contrast. FINDINGS: Aorta: Normal size. Mild aortic wall calcifications. No dissection. Aortic Valve:  Trileaflet.  No calcifications. Coronary Arteries:  Normal coronary origin.  Right dominance. RCA is a dominant artery. There is no plaque. Left main gives rise to LAD and LCX arteries. LM has no disease. LAD has calcified plaque proximally causing mild stenosis (25-49%). LCX is a non-dominant artery.  There is no plaque. Other findings: Normal pulmonary vein drainage into the left atrium. Normal left atrial appendage without a thrombus. Normal size of the pulmonary artery. IMPRESSION: 1. Coronary calcium score of 119. This was 68th percentile for age and sex matched control. 2. Normal coronary origin with right dominance. 3. Mild proximal LAD stenosis (25-49%). 4. CAD-RADS 2. Mild non-obstructive CAD (25-49%). Consider non-atherosclerotic causes of chest pain. Consider preventive therapy and risk factor modification. Electronically Signed   By:  Debbe Odea M.D.   On: 11/17/2022 19:19    MDS (myelodysplastic syndrome), low grade (HCC) #[FEB 2023] Mild macrocytic anemia/thrombocytopenia-hemoglobin around 11-12; platelets 127-140s.  Normal white count. December 2023-bone marrow biopsy -Hypercellular bone marrow for age with dyspoietic changes; no blasts noted. cytogenetics/FISH positive for 5 q. Deletion.  December 2023 hemoglobin 10.3; platelets 137; white count is normal-   # Patient understands disease incurable; current treatments are usually indefinite.  Reviewed the natural history of low-grade MDS-deletion overall good prognosis.   #  Hemoglobin today is 9.8- lower from baseline 11.7.  Continue oral iron/patient preference. As per patient is not significant symptomatic. Given her preference,  I think is reasonable to continue surveillance every 3-4 months.  The patient continues to drop/gets more symptomatic recommend Revlimid 10 mg 3 on and 1 week off.  For now continue surveillance. Patient will call us if more symptomatic.   # Nonobstructive CAD/ Recent Cardia CTA showed mild nonobstructive CAD [[facility]]- ok with asprin. However, discussed that bruising might get worse.   # Hx of B12 deficiency-  on B12 supplementation-2024 Elevated B12- recommend B12 every other day.   # DISPOSITION:  # Follow up in  4  MONTHS- - MD;labs- cbc/cmp; LDH;  b12;-  Dr.B   All questions were answered. The patient knows to call the clinic with any problems, questions or concerns.    Earna Coder, MD 11/25/2022 3:20 PM

## 2022-11-25 NOTE — Assessment & Plan Note (Addendum)
#[  FEB 2023] Mild macrocytic anemia/thrombocytopenia-hemoglobin around 11-12; platelets 127-140s.  Normal white count. December 2023-bone marrow biopsy -Hypercellular bone marrow for age with dyspoietic changes; no blasts noted. cytogenetics/FISH positive for 5 q. Deletion.  December 2023 hemoglobin 10.3; platelets 137; white count is normal-   # Patient understands disease incurable; current treatments are usually indefinite.  Reviewed the natural history of low-grade MDS-deletion overall good prognosis.   #  Hemoglobin today is 9.8- lower from baseline 11.7.  Continue oral iron/patient preference. As per patient is not significant symptomatic. Given her preference,  I think is reasonable to continue surveillance every 3-4 months.  The patient continues to drop/gets more symptomatic recommend Revlimid 10 mg 3 on and 1 week off.  For now continue surveillance. Patient will call us if more symptomatic.   # Nonobstructive CAD/ Recent Cardia CTA showed mild nonobstructive CAD [[facility]]- ok with asprin. However, discussed that bruising might get worse.   # Hx of B12 deficiency- on B12 supplementation-2024 Elevated B12- recommend B12 every other day.   # DISPOSITION:  # Follow up in  4  MONTHS- - MD;labs- cbc/cmp;iron studies; ferritin; LDH;  b12;-  Dr.B

## 2022-11-30 DIAGNOSIS — M4322 Fusion of spine, cervical region: Secondary | ICD-10-CM | POA: Diagnosis not present

## 2022-11-30 DIAGNOSIS — M5451 Vertebrogenic low back pain: Secondary | ICD-10-CM | POA: Diagnosis not present

## 2022-11-30 DIAGNOSIS — M542 Cervicalgia: Secondary | ICD-10-CM | POA: Diagnosis not present

## 2022-11-30 DIAGNOSIS — M5412 Radiculopathy, cervical region: Secondary | ICD-10-CM | POA: Diagnosis not present

## 2022-12-01 ENCOUNTER — Encounter: Payer: Self-pay | Admitting: Nurse Practitioner

## 2022-12-01 ENCOUNTER — Ambulatory Visit (INDEPENDENT_AMBULATORY_CARE_PROVIDER_SITE_OTHER): Payer: Medicare Other | Admitting: Nurse Practitioner

## 2022-12-01 VITALS — BP 136/78 | HR 86 | Temp 97.8°F | Ht 63.0 in | Wt 179.6 lb

## 2022-12-01 DIAGNOSIS — R600 Localized edema: Secondary | ICD-10-CM | POA: Diagnosis not present

## 2022-12-01 NOTE — Progress Notes (Signed)
Established Patient Office Visit  Subjective:  Patient ID: Sarah [name], female    DOB: July 25, 1950  Age: 72 y.o. MRN: [id number]  CC:  Chief Complaint  Patient presents with   Leg Swelling    Since Sunday both legs    HPI  Sarah [name] presents for LLE. Patient states that she had chicken wings on Sunday and the wings were very salty. She states that after eating the salty chicken wings she had swelling in both of the lower extremity. The swelling is better compared to Monday.   She denies any shortness of breath, chest pain or urinary problem.  HPI   Past Medical History:  Diagnosis Date   Arthritis    Asthma    CAP (community acquired pneumonia) 08/11/2022   Depression    Dyspnea    with heat   Environmental and seasonal allergies    GERD (gastroesophageal reflux disease)    Hyperlipidemia    Hypertension    Hypothyroidism    Presence of dental prosthetic device    Dental implants   Wheezing     Past Surgical History:  Procedure Laterality Date   ABDOMINAL HYSTERECTOMY  04/26/1987   ANTERIOR CERVICAL DECOMPRESSION/DISCECTOMY FUSION 4 LEVELS N/A 11/10/2021   Procedure: C3-7 ANTERIOR CERVICAL DISCECTOMY AND FUSION (GLOBUS FORGE);  Surgeon: Venetia Night, MD;  Location: [facility];  Service: Neurosurgery;  Laterality: N/A;   BROW LIFT Bilateral 02/07/2020   Procedure: BLEPHAROPLASTY UPPER EYELID; W/EXCESS SKIN BROW PTOSIS REPAIR BILATERAL;  Surgeon: Imagene Riches, MD;  Location: [facility];  Service: Ophthalmology;  Laterality: Bilateral;   CATARACT EXTRACTION W/PHACO Right 09/13/2016   Procedure: CATARACT EXTRACTION PHACO AND INTRAOCULAR LENS PLACEMENT (IOC);  Surgeon: Galen Manila, MD;  Location: [facility];  Service: Ophthalmology;  Laterality: Right;  [place] 00:38 AP% 14.3 CDE 5.51 Fluid pack lot # [id number]   CATARACT EXTRACTION W/PHACO Left 10/11/2016   Procedure: CATARACT EXTRACTION PHACO AND INTRAOCULAR LENS PLACEMENT (IOC);  Surgeon:  Galen Manila, MD;  Location: [facility];  Service: Ophthalmology;  Laterality: Left;  [place] 00:30 AP% 11.3 CDE 3.50 fluid pack lot # [id number]   COLONOSCOPY WITH PROPOFOL N/A 09/30/2014   Procedure: COLONOSCOPY WITH PROPOFOL;  Surgeon: Earline Mayotte, MD;  Location: [facility];  Service: Endoscopy;  Laterality: N/A;   COLONOSCOPY WITH PROPOFOL N/A 03/24/2021   Procedure: COLONOSCOPY WITH PROPOFOL;  Surgeon: Toney Reil, MD;  Location: [facility];  Service: Gastroenterology;  Laterality: N/A;   ESOPHAGOGASTRODUODENOSCOPY N/A 09/30/2014   Procedure: ESOPHAGOGASTRODUODENOSCOPY (EGD);  Surgeon: Earline Mayotte, MD;  Location: [facility];  Service: Endoscopy;  Laterality: N/A;   ESOPHAGOGASTRODUODENOSCOPY N/A 03/24/2021   Procedure: ESOPHAGOGASTRODUODENOSCOPY (EGD);  Surgeon: Toney Reil, MD;  Location: [facility];  Service: Gastroenterology;  Laterality: N/A;   FOOT SURGERY Bilateral 04/26/1983   KNEE SURGERY  04/25/2014    Family History  Problem Relation Age of Onset   Diabetes Mother    Hyperlipidemia Mother    Heart disease Mother    Prostate cancer Neg Hx    Kidney cancer Neg Hx    Bladder Cancer Neg Hx     Social History   Socioeconomic History   Marital status: Single    Spouse name: Not on file   Number of children: Not on file   Years of education: Not on file   Highest education level: Not on file  Occupational History   Not on file  Tobacco Use   Smoking status: Never  Smokeless tobacco: Never  Vaping Use   Vaping status: Never Used  Substance and Sexual Activity   Alcohol use: No   Drug use: No   Sexual activity: Never  Other Topics Concern   Not on file  Social History Narrative   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 No smoking; no alcohol. Lives in [place], [place] lives self/kitty.    Social Determinants of Health   Financial Resource Strain: Low Risk  (05/12/2022)   Overall Financial Resource Strain (CARDIA)     Difficulty of Paying Living Expenses: Not very hard  Food Insecurity: No Food Insecurity (09/02/2022)   Hunger Vital Sign    Worried About Running Out of Food in the Last Year: Never true    Ran Out of Food in the Last Year: Never true  Transportation Needs: No Transportation Needs (09/02/2022)   PRAPARE - Administrator, Civil Service (Medical): No    Lack of Transportation (Non-Medical): No  Physical Activity: Insufficiently Active (05/12/2022)   Exercise Vital Sign    Days of Exercise per Week: 1 day    Minutes of Exercise per Session: 60 min  Stress: No Stress Concern Present (05/12/2022)   [employer] of Occupational Health - Occupational Stress Questionnaire    Feeling of Stress : Not at all  Social Connections: Unknown (05/12/2022)   Social Connection and Isolation Panel [NHANES]    Frequency of Communication with Friends and Family: More than three times a week    Frequency of Social Gatherings with Friends and Family: More than three times a week    Attends Religious Services: More than 4 times per year    Active Member of [employer] or Organizations: Yes    Attends Banker Meetings: More than 4 times per year    Marital Status: Not on file  Intimate Partner Violence: Not At Risk (08/15/2022)   Humiliation, Afraid, Rape, and Kick questionnaire    Fear of Current or Ex-Partner: No    Emotionally Abused: No    Physically Abused: No    Sexually Abused: No     Outpatient Medications Prior to Visit  Medication Sig Dispense Refill   albuterol (VENTOLIN HFA) 108 (90 Base) MCG/ACT inhaler Inhale 1-2 puffs into the lungs every 4 (four) hours as needed for wheezing or shortness of breath. 18 g 3   amLODipine (NORVASC) 5 MG tablet TAKE 1 TABLET BY MOUTH DAILY 100 tablet 2   Calcium Citrate-Vitamin D (CALCIUM CITRATE + D PO) Take 1 tablet by mouth 2 (two) times daily.     citalopram (CELEXA) 20 MG tablet TAKE 1 TABLET BY MOUTH DAILY 90 tablet 3   famotidine  (PEPCID) 20 MG tablet TAKE 1 TABLET BY MOUTH DAILY. BEFORE DINNER 30 tablet 11   ferrous sulfate 324 MG TBEC Take 2 tablets by mouth. 65 mg of elemental iron     fexofenadine (ALLEGRA) 180 MG tablet Take 180 mg by mouth daily.     Fluticasone-Umeclidin-Vilant (TRELEGY ELLIPTA) 100-62.5-25 MCG/ACT AEPB Inhale 1 puff into the lungs daily. 1 each 11   gabapentin (NEURONTIN) 100 MG capsule Take 3 capsules (300 mg total) by mouth at bedtime. Home med.     levothyroxine (SYNTHROID) 50 MCG tablet TAKE 1 TABLET BY MOUTH DAILY ON  EMPTY STOMACH WITH A GLASS OF  WATER AT LEAST 30 TO 60 MINUTES  BEFORE BREAKFAST 100 tablet 2   metoprolol tartrate (LOPRESSOR) 100 MG tablet Take 1 tablet (100 mg total) by mouth once  for 1 dose. TWO HOURS PRIOR TO CARDIAC CT 1 tablet 0   pantoprazole (PROTONIX) 40 MG tablet TAKE 1 TABLET BY MOUTH DAILY 100 tablet 2   rosuvastatin (CRESTOR) 10 MG tablet Take 1 tablet (10 mg total) by mouth at bedtime. Home med.     solifenacin (VESICARE) 5 MG tablet TAKE ONE TABLET BY MOUTH EVERY DAY 30 tablet 2   traZODone (DESYREL) 50 MG tablet TAKE 1/2 TO 1 TABLET BY MOUTH AT BEDTIME AS NEEDED FOR SLEEP 90 tablet 3   vitamin B-12 (CYANOCOBALAMIN) 1000 MCG tablet Take 1,000 mcg by mouth daily.     No facility-administered medications prior to visit.    No Known Allergies  ROS Review of Systems Negative unless indicated in HPI.    Objective:    Physical Exam Constitutional:      Appearance: Normal appearance.  HENT:     Mouth/Throat:     Mouth: Mucous membranes are moist.  Eyes:     Conjunctiva/sclera: Conjunctivae normal.     Pupils: Pupils are equal, round, and reactive to light.  Cardiovascular:     Rate and Rhythm: Normal rate and regular rhythm.     Pulses: Normal pulses.     Heart sounds: Normal heart sounds.  Pulmonary:     Effort: Pulmonary effort is normal.     Breath sounds: Normal breath sounds.  Abdominal:     General: Bowel sounds are normal.     Palpations:  Abdomen is soft.  Musculoskeletal:     Cervical back: Normal range of motion. No tenderness.     Right lower leg: Edema present.     Left lower leg: Edema present.  Skin:    General: Skin is warm.     Findings: No bruising.  Neurological:     General: No focal deficit present.     Mental Status: She is alert and oriented to person, place, and time. Mental status is at baseline.  Psychiatric:        Mood and Affect: Mood normal.        Behavior: Behavior normal.        Thought Content: Thought content normal.        Judgment: Judgment normal.     BP 136/78   Pulse 86   Temp 97.8 F (36.6 C) (Oral)   Ht 5\' 3"  (1.6 m)   Wt 179 lb 9.6 oz (81.5 kg)   SpO2 95%   BMI 31.81 kg/m  Wt Readings from Last 3 Encounters:  12/01/22 179 lb 9.6 oz (81.5 kg)  11/25/22 181 lb 12.8 oz (82.5 kg)  11/23/22 180 lb 12.8 oz (82 kg)     Health Maintenance  Topic Date Due   COVID-19 Vaccine (6 - 2023-24 season) 03/31/2022   INFLUENZA VACCINE  07/24/2023 (Originally 11/24/2022)   Medicare Annual Wellness (AWV)  05/13/2023   MAMMOGRAM  07/11/2023   Colonoscopy  03/24/2024   DTaP/Tdap/Td (3 - Td or Tdap) 05/26/2032   Pneumonia Vaccine 66+ Years old  Completed   DEXA SCAN  Completed   Hepatitis C Screening  Completed   Zoster Vaccines- Shingrix  Completed   HPV VACCINES  Aged Out    There are no preventive care reminders to display for this patient.  Lab Results  Component Value Date   TSH 0.82 11/21/2022   Lab Results  Component Value Date   WBC 4.5 11/25/2022   HGB 9.8 (L) 11/25/2022   HCT 29.3 (L) 11/25/2022   MCV 113.1 (  H) 11/25/2022   PLT 117 (L) 11/25/2022   Lab Results  Component Value Date   NA 137 11/25/2022   K 3.7 11/25/2022   CO2 24 11/25/2022   GLUCOSE 151 (H) 11/25/2022   BUN 18 11/25/2022   CREATININE 0.74 11/25/2022   BILITOT 0.5 11/25/2022   ALKPHOS 52 11/25/2022   AST 16 11/25/2022   ALT 15 11/25/2022   PROT 6.8 11/25/2022   ALBUMIN 4.0 11/25/2022    CALCIUM 9.1 11/25/2022   ANIONGAP 8 11/25/2022   GFR 76.55 11/21/2022   Lab Results  Component Value Date   CHOL 152 11/21/2022   Lab Results  Component Value Date   HDL 72.40 11/21/2022   Lab Results  Component Value Date   LDLCALC 68 11/21/2022   Lab Results  Component Value Date   TRIG 58.0 11/21/2022   Lab Results  Component Value Date   CHOLHDL 2 11/21/2022   Lab Results  Component Value Date   HGBA1C 5.9 11/21/2022      Assessment & Plan:  Bilateral lower extremity edema Assessment & Plan: Edema noticed bilaterally more on the right compared to the left. Advised patient to keep the feet elevated and use compression stockings. Ace bandage was provided and wrapped bilaterally. Advised patient to consume low-salt diet. Patient let us know if the symptoms is not improving.     Follow-up: Return if symptoms worsen or fail to improve.   Kara Dies, NP

## 2022-12-02 ENCOUNTER — Encounter: Payer: Self-pay | Admitting: Nurse Practitioner

## 2022-12-02 DIAGNOSIS — R600 Localized edema: Secondary | ICD-10-CM | POA: Insufficient documentation

## 2022-12-02 NOTE — Assessment & Plan Note (Addendum)
Edema noticed bilaterally more on the right compared to the left. Advised patient to keep the feet elevated and use compression stockings. Ace bandage was provided and wrapped bilaterally. Advised patient to consume low-salt diet. Patient let us know if the symptoms is not improving.

## 2022-12-07 DIAGNOSIS — M5451 Vertebrogenic low back pain: Secondary | ICD-10-CM | POA: Diagnosis not present

## 2022-12-07 DIAGNOSIS — M4322 Fusion of spine, cervical region: Secondary | ICD-10-CM | POA: Diagnosis not present

## 2022-12-07 DIAGNOSIS — M5412 Radiculopathy, cervical region: Secondary | ICD-10-CM | POA: Diagnosis not present

## 2022-12-07 DIAGNOSIS — M542 Cervicalgia: Secondary | ICD-10-CM | POA: Diagnosis not present

## 2022-12-14 DIAGNOSIS — M4322 Fusion of spine, cervical region: Secondary | ICD-10-CM | POA: Diagnosis not present

## 2022-12-14 DIAGNOSIS — M5451 Vertebrogenic low back pain: Secondary | ICD-10-CM | POA: Diagnosis not present

## 2022-12-14 DIAGNOSIS — M542 Cervicalgia: Secondary | ICD-10-CM | POA: Diagnosis not present

## 2022-12-14 DIAGNOSIS — M5412 Radiculopathy, cervical region: Secondary | ICD-10-CM | POA: Diagnosis not present

## 2022-12-16 ENCOUNTER — Ambulatory Visit: Payer: Self-pay

## 2022-12-16 NOTE — Patient Outreach (Signed)
  Care Coordination   Follow Up Visit Note   12/16/2022 Name: Sarah [name] MRN: [id number] DOB: 11-Jun-1950  [name] is a 72 y.o. year old female who sees Darrick Huntsman, Mar Daring, MD for primary care. I spoke with  [name] by phone today.  What matters to the patients health and wellness today?  Patient states she continues to deal with fatigue. She reports having follow up visit with oncologist on 11/25/22.  Discussed CBC lab with patient from 11/25/22.  Hgb 9.8. Patient states her breathing has been a little worse over the last 2-3 months.  She states her doctors are aware.   Patient states she saw her primary provider on 12/01/22 due to swelling in LE.  She states this has improved.  She reports wearing her compression hose, elevating legs and following low salt diet plan.    Patient states she is taking her medications as prescribed.    Goals Addressed             This Visit's Progress    Effective management of COPD and health conditions       Interventions Today    Flowsheet Row Most Recent Value  Chronic Disease   Chronic disease during today's visit Chronic Obstructive Pulmonary Disease (COPD), Other  [Mycrocytic anemia]  General Interventions   General Interventions Discussed/Reviewed General Interventions Reviewed, Doctor Visits, Labs  Ada of treatment plan and patients adherence to plan as established by provider.  Assessed for COPD / anemia symptoms.]  Labs --  [reviewed recent CBC from 11/25/22.]  Doctor Visits Discussed/Reviewed Doctor Visits Reviewed  [name] upcoming provider visits.  Reviewed 8/2 oncology note and 12/01/22 primary provider note.]  Education Interventions   Education Provided Provided Education, Provided Printed Education  [reinforced established plan of care/ recommendation by oncologist/ primary care providers with patient. Sent patient education articles on COPD/ anemia through Mychart. Advised to wear compression hose, elevate legs and eat low  salt diet.]  Provided Verbal Education On Other  [Reviewed COPD symptoms.  Advised to notify provider for worsening SOB.  Advised to call 911 for severe symptoms.]  Pharmacy Interventions   Pharmacy Dicussed/Reviewed Pharmacy Topics Reviewed  [medications reviewed and compliance discussed.]               SDOH assessments and interventions completed:  No     Care Coordination Interventions:  Yes, provided   Follow up plan: Follow up call scheduled for 01/12/23    Encounter Outcome:  Pt. Visit Completed   [name] RN,BSN,CCM [facility] Care Coordination [phone] direct line

## 2022-12-16 NOTE — Patient Instructions (Addendum)
Visit Information  Thank you for taking time to visit with me today. Please don't hesitate to contact me if I can be of assistance to you.   Following are the goals we discussed today:   Goals Addressed             This Visit's Progress    Effective management of COPD and health conditions       Interventions Today    Flowsheet Row Most Recent Value  Chronic Disease   Chronic disease during today's visit Chronic Obstructive Pulmonary Disease (COPD), Other  [Mycrocytic anemia]  General Interventions   General Interventions Discussed/Reviewed General Interventions Reviewed, Doctor Visits, Labs  [place] of treatment plan and patients adherence to plan as established by provider.  Assessed for COPD / anemia symptoms.]  Labs --  [reviewed recent CBC from 11/25/22.]  Doctor Visits Discussed/Reviewed Doctor Visits Reviewed  [name] upcoming provider visits.  Reviewed 8/2 oncology note and 12/01/22 primary provider note.]  Education Interventions   Education Provided Provided Education, Provided Printed Education  [reinforced established plan of care/ recommendation by oncologist/ primary care providers with patient. Sent patient education articles on COPD/ anemia through Mychart. Advised to wear compression hose, elevate legs and eat low salt diet.]  Provided Verbal Education On Other  [Reviewed COPD symptoms.  Advised to notify provider for worsening SOB.  Advised to call 911 for severe symptoms.]  Pharmacy Interventions   Pharmacy Dicussed/Reviewed Pharmacy Topics Reviewed  [medications reviewed and compliance discussed.]               Our next appointment is by telephone on 01/12/23 at 11 am  Please call the care guide team at [phone] if you need to cancel or reschedule your appointment.   If you are experiencing a Mental Health or Behavioral Health Crisis or need someone to talk to, please call the Suicide and Crisis Lifeline: 988 call 1-800-273-TALK (toll free, 24 hour  hotline)  Patient verbalizes understanding of instructions and care plan provided today and agrees to view in MyChart. Active MyChart status and patient understanding of how to access instructions and care plan via MyChart confirmed with patient.     [name] RN,BSN,CCM [facility] Care Coordination [phone] direct line  Anemia  Anemia is a condition in which there are not enough red blood cells or hemoglobin in the blood. Hemoglobin is a substance in red blood cells that carries oxygen. When you do not have enough red blood cells or hemoglobin (are anemic), your body cannot get enough oxygen, and your organs may not work properly. As a result, you may feel very tired or have other problems. What are the causes? Common causes of anemia include: Excessive bleeding. Anemia can be caused by excessive bleeding inside or outside the body, including bleeding from the intestines or from heavy menstrual periods in females. Poor nutrition. Long-lasting (chronic) kidney, thyroid, and liver disease. Bone marrow disorders, spleen problems, and blood disorders. Cancer and treatments for cancer. Human immunodeficiency virus (HIV) and acquired immunodeficiency syndrome (AIDS). Infections, medicines, and autoimmune disorders that destroy red blood cells. What are the signs or symptoms? Symptoms of this condition include: Minor weakness. Dizziness. Headache, or difficulties concentrating and sleeping. Heartbeats that feel irregular or faster than normal (palpitations). Shortness of breath, especially with exercise. Pale skin, lips, and nails, or cold hands and feet. Upset stomach (indigestion) and nausea. Symptoms may occur suddenly or develop slowly. If your anemia is mild, you may not have symptoms. How is this diagnosed? This  condition is diagnosed based on blood tests, your medical history, and a physical exam. In some cases, a test may be needed in which cells are removed from the soft tissue  inside of a bone and looked at under a microscope (bone marrow biopsy). Your health care provider may also check your stool (feces) for blood and may do more testing to look for the cause of your bleeding. Other tests may include: Imaging tests, such as a CT scan or MRI. A procedure to see inside your esophagus and stomach (endoscopy). The esophagus is the part of the body that moves food from your mouth to your stomach. A procedure to see inside your colon and rectum (colonoscopy). How is this treated? Treatment for this condition depends on the cause. If you continue to lose a lot of blood, you may need to be treated at a hospital. Treatment may include: Taking supplements of iron, vitamin B12, or folic acid. Taking a hormone medicine (erythropoietin) that can help to stimulate red blood cell growth. Receiving donated blood through an IV (blood transfusion). This may be needed if you lose a lot of blood. Making changes to your diet. Having surgery to remove your spleen. Follow these instructions at home: Take over-the-counter and prescription medicines only as told by your health care provider. Take supplements only as told by your health care provider. Follow any diet instructions that you were given by your health care provider. Keep all follow-up visits. Your health care provider will want to recheck your blood tests. Contact a health care provider if: You develop new bleeding anywhere in the body. You are very weak. Get help right away if: You are short of breath. You have pain in your abdomen or chest. You are dizzy or feel faint. You have trouble concentrating. You have bloody stools, black stools, or tarry stools. You vomit repeatedly or you vomit up blood. These symptoms may be an emergency. Get help right away. Call 911. Do not wait to see if the symptoms will go away. Do not drive yourself to the hospital. Summary Anemia is a condition in which you do not have enough red  blood cells or enough of a substance in your red blood cells that carries oxygen. Symptoms may occur suddenly or develop slowly. If your anemia is mild, you may not have symptoms. This condition is diagnosed with blood tests, a medical history, and a physical exam. Other tests may be needed. Treatment for this condition depends on the cause of the anemia. This information is not intended to replace advice given to you by your health care provider. Make sure you discuss any questions you have with your health care provider. Document Revised: 07/05/2021 Document Reviewed: 07/05/2021 Elsevier Patient Education  2024 [employer]vier Inc.  Living with COPD Being diagnosed with chronic obstructive pulmonary disease (COPD) changes your life physically and emotionally. Having COPD can affect your ability to work and do things you enjoy. COPD is not the same for everyone, and it may change over time. Your health care providers can help you come up with the COPD management plan that works best for you. How to manage lifestyle changes Treatment plan Work closely with your health care providers. Follow your COPD management plan. This plan includes: Instructions about activities, exercises, diet, medicines, what to do when COPD flares up, and when to call your health care provider. A pulmonary rehabilitation program. In pulmonary rehab, you will learn about COPD, do exercises for fitness and breathing, and get support  from health care providers and other people who have COPD. Managing emotions and stress Living with a chronic disease means you may also struggle with stressful emotions, such as sadness, fear, and worry. Here are some ways to manage these emotions: Talk to someone about your fear, anxiety, depression, or stress. Learn strategies to avoid or reduce stress and ask for help if you are struggling with depression or anxiety. Consider joining a COPD support group, online or in person.  Adjusting to  changes COPD may limit the things you can do, but you can make certain changes to help you cope with the diagnosis. Ask for help when you need it. Getting support from friends, family, and your health care team is an important part of managing the condition. Try to get regular exercise as prescribed by a health care provider or pulmonary rehab team. Exercising can help COPD, even if you are a bit short of breath. Take steps to prevent infection and protect your lungs: Wash your hands often and avoid being in crowds. Stay away from friends and family members who are sick. Check your local air quality each day, and stay out of areas where air pollution is likely. How to recognize changes in your condition Recognizing changes in your COPD COPD is a progressive disease. It is important to let the health care team know if your COPD is getting worse. Your treatment plan may need to change. Watch for: Increased shortness of breath, wheezing, cough, or fatigue. Loss of ability to exercise or perform daily activities, like climbing stairs. More frequent symptom flares. Signs of depression or anxiety. Recognizing stress It is normal to have additional stress when you have COPD. However, prolonged stress and anxiety can make COPD worse and lead to depression. Recognize the warning signs, which include: Feeling sad or worried more often or most of the time. Having less energy and losing interest in pleasurable activities. Changes in your appetite or sleeping patterns. Being easily angered or irritated. Having unexplained aches and pains, digestive problems, or headaches. Follow these instructions at home: Eating and drinking  Eat foods that are high in fiber, such as fresh fruits and vegetables, whole grains, and beans. Limit foods that are high in fat and processed sugars, such as fried or sweet foods. Follow a balanced diet and maintain a healthy weight. Being overweight or underweight can make COPD  worse. You may work with a Data processing manager as part of your pulmonary rehab program. Drink enough fluid to keep your urine pale yellow. If you drink alcohol: Limit how much you have to: 0-1 drink a day for women who are not pregnant. 0-2 drinks a day for men. Know how much alcohol is in your drink. In the [place] one drink equals one 12 oz bottle of beer (355 mL), one 5 oz glass of wine (148 mL), or one 1 oz glass of hard liquor (44 mL). Lifestyle If you smoke, the most important thing that you can do is to stop smoking. Continuing to smoke will cause the disease to progress faster. Do not use any products that contain nicotine or tobacco. These products include cigarettes, chewing tobacco, and vaping devices, such as e-cigarettes. If you need help quitting, ask your health care provider. Avoid exposure to things that irritate your lungs, such as smoke, chemicals, and fumes. Activity Balance exercise and rest. Take short walks every 1-2 hours. This is important to improve blood flow and breathing. Ask for help if you feel weak or unsteady. Do exercises  that include controlled breathing with body movement, such as tai chi. General instructions Take over-the-counter and prescription medicines only as told by your health care provider. Take vitamin and protein supplements as told by your health care provider or dietitian. Practice good oral hygiene and see your dental care provider regularly. An oral infection can also spread to your lungs. Make sure you receive all the vaccines that your health care provider recommends. Keep all follow-up visits. This is important. Contact a health care provider if you: Are struggling to manage your COPD. Have emotional stress that interferes with your ability to cope with COPD. Get help right away if you: Have thoughts of suicide, death, or hurting yourself or others. If you ever feel like you may hurt yourself or others, or have thoughts about taking your own life,  get help right away. Go to your nearest emergency department or: Call your local emergency services (911 in the [place] Call a suicide crisis helpline, such as the National Suicide Prevention Lifeline at (856)312-7609 or 988 in the [place] This is open 24 hours a day in the [place] Text the Crisis Text Line at 3123808305 (in the [place] Summary Being diagnosed with chronic obstructive pulmonary disease (COPD) changes your life physically and emotionally. Work with your health care providers and follow your COPD management plan. A pulmonary rehabilitation program is an important part of COPD management. Prolonged stress, anxiety, and depression can make COPD worse. Let your health care provider know if emotional stress interferes with your ability to cope with and manage COPD. This information is not intended to replace advice given to you by your health care provider. Make sure you discuss any questions you have with your health care provider. Document Revised: 11/04/2020 Document Reviewed: 04/29/2020 Elsevier Patient Education  2024 [employer].

## 2022-12-20 DIAGNOSIS — G4733 Obstructive sleep apnea (adult) (pediatric): Secondary | ICD-10-CM | POA: Diagnosis not present

## 2022-12-21 ENCOUNTER — Other Ambulatory Visit: Payer: Self-pay | Admitting: Internal Medicine

## 2022-12-22 NOTE — Telephone Encounter (Signed)
Refilled: 08/19/2022 Last OV: 11/21/2022 Next OV: 05/24/2023

## 2022-12-29 ENCOUNTER — Telehealth: Payer: Self-pay | Admitting: Neurosurgery

## 2022-12-29 NOTE — Telephone Encounter (Signed)
11/10/21 C3-7 ACDF  Patient called and requested an appt for continued neck pain. I scheduled her for a return visit with Dr.Yarbrough on 9/19, do you want xrays?

## 2022-12-30 NOTE — Telephone Encounter (Signed)
Left message on voice mail to have xrays done before her appt.

## 2023-01-03 ENCOUNTER — Other Ambulatory Visit: Payer: Self-pay | Admitting: Family Medicine

## 2023-01-03 DIAGNOSIS — G959 Disease of spinal cord, unspecified: Secondary | ICD-10-CM

## 2023-01-04 DIAGNOSIS — M4322 Fusion of spine, cervical region: Secondary | ICD-10-CM | POA: Diagnosis not present

## 2023-01-04 DIAGNOSIS — M5412 Radiculopathy, cervical region: Secondary | ICD-10-CM | POA: Diagnosis not present

## 2023-01-04 DIAGNOSIS — M5451 Vertebrogenic low back pain: Secondary | ICD-10-CM | POA: Diagnosis not present

## 2023-01-04 DIAGNOSIS — M542 Cervicalgia: Secondary | ICD-10-CM | POA: Diagnosis not present

## 2023-01-11 DIAGNOSIS — M4322 Fusion of spine, cervical region: Secondary | ICD-10-CM | POA: Diagnosis not present

## 2023-01-11 DIAGNOSIS — M5451 Vertebrogenic low back pain: Secondary | ICD-10-CM | POA: Diagnosis not present

## 2023-01-11 DIAGNOSIS — M5412 Radiculopathy, cervical region: Secondary | ICD-10-CM | POA: Diagnosis not present

## 2023-01-11 DIAGNOSIS — M542 Cervicalgia: Secondary | ICD-10-CM | POA: Diagnosis not present

## 2023-01-11 NOTE — Addendum Note (Signed)
Addended by: Ernie Hew on: 01/11/2023 03:36 PM   Modules accepted: Orders

## 2023-01-12 ENCOUNTER — Ambulatory Visit
Admission: RE | Admit: 2023-01-12 | Discharge: 2023-01-12 | Disposition: A | Discharge status: Home / Self Care | Payer: Medicare Other | Source: Ambulatory Visit | Attending: Neurosurgery | Admitting: Neurosurgery

## 2023-01-12 ENCOUNTER — Other Ambulatory Visit: Payer: Self-pay | Admitting: Internal Medicine

## 2023-01-12 ENCOUNTER — Ambulatory Visit: Payer: Self-pay

## 2023-01-12 ENCOUNTER — Ambulatory Visit (INDEPENDENT_AMBULATORY_CARE_PROVIDER_SITE_OTHER): Payer: Medicare Other | Admitting: Neurosurgery

## 2023-01-12 ENCOUNTER — Ambulatory Visit
Admission: RE | Admit: 2023-01-12 | Discharge: 2023-01-12 | Disposition: A | Discharge status: Home / Self Care | Payer: Medicare Other | Attending: Neurosurgery | Admitting: Neurosurgery

## 2023-01-12 ENCOUNTER — Encounter: Payer: Self-pay | Admitting: Neurosurgery

## 2023-01-12 VITALS — BP 122/72 | Ht 63.0 in | Wt 179.0 lb

## 2023-01-12 DIAGNOSIS — G959 Disease of spinal cord, unspecified: Secondary | ICD-10-CM | POA: Diagnosis not present

## 2023-01-12 DIAGNOSIS — Z09 Encounter for follow-up examination after completed treatment for conditions other than malignant neoplasm: Secondary | ICD-10-CM

## 2023-01-12 DIAGNOSIS — Z981 Arthrodesis status: Secondary | ICD-10-CM | POA: Diagnosis not present

## 2023-01-12 DIAGNOSIS — Z4789 Encounter for other orthopedic aftercare: Secondary | ICD-10-CM | POA: Diagnosis not present

## 2023-01-12 DIAGNOSIS — M542 Cervicalgia: Secondary | ICD-10-CM | POA: Diagnosis not present

## 2023-01-12 NOTE — Patient Outreach (Signed)
Care Coordination   Follow Up Visit Note   01/12/2023 Name: Sarah [name] MRN: [id number] DOB: 12-07-1950  Sarah [name] is a 72 y.o. year old female who sees Darrick Huntsman, Mar Daring, MD for primary care. I spoke with  [name] by phone today.  What matters to the patients health and wellness today?  Patient reports ongoing fatigue.   She denies any increase in symptoms related to COPD.  Patient states she has not checked her oxygen levels lately. Denies having to use her nebulizer to treat increase SOB symptoms.  Patient states she is taking her iron medication. Denies issues with constipation at this time.       Goals Addressed             This Visit's Progress    Effective management of COPD and health conditions       Interventions Today    Flowsheet Row Most Recent Value  Chronic Disease   Chronic disease during today's visit Chronic Obstructive Pulmonary Disease (COPD), Other  [anemia]  General Interventions   General Interventions Discussed/Reviewed General Interventions Reviewed, Doctor Visits, Labs  [place] of treatment plan for mentioned health conditions and patients adherence to plan as established by provider. Assessed for COPD symptoms and effects of anemia.]  Doctor Visits Discussed/Reviewed Doctor Visits Reviewed  Exercise Interventions   Exercise Discussed/Reviewed Physical Activity  [Advised to remain active as tolerated / mild exercise such as walking 10-15 min 3x week]  Education Interventions   Education Provided Provided Education  [Advised to check oxygen level daily. Reviewed COPD action plan and when to contact provider for symptoms. Advised to call 911 for severe symptoms.  Discussed ways to help with constipation due to taking iron.]  Nutrition Interventions   Nutrition Discussed/Reviewed Adding fruits and vegetables, Nutrition Reviewed  Pharmacy Interventions   Pharmacy Dicussed/Reviewed Pharmacy Topics Reviewed  [medications reviewed and  compliance with medications discussed. Advised to use nebulizer and inhaler as prescribed / recommended by provider]               SDOH assessments and interventions completed:  No     Care Coordination Interventions:  Yes, provided   Follow up plan: Follow up call scheduled for 03/07/23    Encounter Outcome:  Patient Visit Completed   [name] RN,BSN,CCM [facility] Care Coordination [phone] direct line

## 2023-01-12 NOTE — Patient Instructions (Signed)
Visit Information  Thank you for taking time to visit with me today. Please don't hesitate to contact me if I can be of assistance to you.   Following are the goals we discussed today:   Goals Addressed             This Visit's Progress    Effective management of COPD and health conditions       Interventions Today    Flowsheet Row Most Recent Value  Chronic Disease   Chronic disease during today's visit Chronic Obstructive Pulmonary Disease (COPD), Other  [anemia]  General Interventions   General Interventions Discussed/Reviewed General Interventions Reviewed, Doctor Visits, Labs  [place] of treatment plan for mentioned health conditions and patients adherence to plan as established by provider. Assessed for COPD symptoms and effects of anemia.]  Doctor Visits Discussed/Reviewed Doctor Visits Reviewed  Exercise Interventions   Exercise Discussed/Reviewed Physical Activity  [Advised to remain active as tolerated / mild exercise such as walking 10-15 min 3x week]  Education Interventions   Education Provided Provided Education  [Advised to check oxygen level daily. Reviewed COPD action plan and when to contact provider for symptoms. Advised to call 911 for severe symptoms.  Discussed ways to help with constipation due to taking iron.]  Nutrition Interventions   Nutrition Discussed/Reviewed Adding fruits and vegetables, Nutrition Reviewed  Pharmacy Interventions   Pharmacy Dicussed/Reviewed Pharmacy Topics Reviewed  [medications reviewed and compliance with medications discussed. Advised to use nebulizer and inhaler as prescribed / recommended by provider]               Our next appointment is by telephone on 03/07/23 at 1:30 pm  Please call the care guide team at [phone] if you need to cancel or reschedule your appointment.   If you are experiencing a Mental Health or Behavioral Health Crisis or need someone to talk to, please call the Suicide and Crisis Lifeline:  988 call 1-800-273-TALK (toll free, 24 hour hotline)  Patient verbalizes understanding of instructions and care plan provided today and agrees to view in MyChart. Active MyChart status and patient understanding of how to access instructions and care plan via MyChart confirmed with patient.     [name] RN,BSN,CCM [facility] Care Coordination [phone] direct line

## 2023-01-12 NOTE — Progress Notes (Signed)
   REFERRING PHYSICIAN:  No referring provider defined for this encounter.  DOS: 11/10/21 C3-7 ACDF   HISTORY OF PRESENT ILLNESS: Sarah [name] is status post ACDF.  She is doing very well.  Her balance is improved.  She had a fall approximately 4 months ago and has had some right-sided neck pain.  She has been doing physical therapy which has helped.  She has done multiple steroid tapers, which helped.     PHYSICAL EXAMINATION:  NEUROLOGICAL:  General: In no acute distress.   Awake, alert, oriented to person, place, and time.  Pupils equal round and reactive to light.  Facial tone is symmetric.   Strength: Side Biceps Triceps Deltoid Interossei Grip Wrist Ext. Wrist Flex.  R 5 5 5 5 5 5 5   L 5 5 5 5 5 5 5    Incision c/d/I and healing well   Imaging:  Expected settling at C3-4.  Appears well-healed and fused on xrays 01/12/2023  Assessment / Plan: Sarah [name] is doing well after C3-7 ACDF.   I am pleased with her progress.  Her pain is improved.  I recommended ibuprofen and as needed muscle relaxants to help with her pain.  She will continue physical therapy and exercises.  I expect her pain will continue to improve.  We will touch base with her in approximately 4 weeks.  If she is any worse, we will consider referral to pain management for injections.  I spent a total of 10 minutes in this patient's care today. This time was spent reviewing pertinent records including imaging studies, obtaining and confirming history, performing a directed evaluation, formulating and discussing my recommendations, and documenting the visit within the medical record.    Venetia Night MD Dept of Neurosurgery

## 2023-01-16 ENCOUNTER — Encounter: Payer: Self-pay | Admitting: Pulmonary Disease

## 2023-01-16 ENCOUNTER — Ambulatory Visit: Payer: Medicare Other | Admitting: Pulmonary Disease

## 2023-01-16 VITALS — BP 122/78 | HR 70 | Temp 97.9°F | Ht 63.0 in | Wt 181.2 lb

## 2023-01-16 DIAGNOSIS — D46Z Other myelodysplastic syndromes: Secondary | ICD-10-CM

## 2023-01-16 DIAGNOSIS — R0600 Dyspnea, unspecified: Secondary | ICD-10-CM | POA: Diagnosis not present

## 2023-01-16 DIAGNOSIS — Z23 Encounter for immunization: Secondary | ICD-10-CM

## 2023-01-16 DIAGNOSIS — J4540 Moderate persistent asthma, uncomplicated: Secondary | ICD-10-CM

## 2023-01-16 DIAGNOSIS — D539 Nutritional anemia, unspecified: Secondary | ICD-10-CM

## 2023-01-16 DIAGNOSIS — G4733 Obstructive sleep apnea (adult) (pediatric): Secondary | ICD-10-CM

## 2023-01-16 LAB — NITRIC OXIDE: Nitric Oxide: 7

## 2023-01-16 MED ORDER — TRELEGY ELLIPTA 200-62.5-25 MCG/ACT IN AEPB: 1.0000 | INHALATION_SPRAY | Freq: Every day | RESPIRATORY_TRACT | 0 refills | Status: DC

## 2023-01-16 NOTE — Patient Instructions (Signed)
You received your flu vaccine today.  We are going to increase the strength of your Trelegy.  Please let us know how you do with the new strength so we can call it into the pharmacy for you.  We are going to refer you to pulmonary rehab [facility]).  Your lungs sounded clear today.  The level of inflammation in your lungs was low.  This is good news.  Continue using your CPAP machine as you are doing.  We will see her in follow-up in 4 months time call sooner should any new problems arise.

## 2023-01-16 NOTE — Progress Notes (Signed)
Subjective:    Patient ID: Sarah [name], female    DOB: [birth year], 72 y.o.   MRN: [id number]  Patient Care Team: Sherlene Shams, MD as PCP - General (Internal Medicine) Debbe Odea, MD as PCP - Cardiology (Cardiology) Sherlene Shams, MD (Internal Medicine) Lemar Livings Merrily Pew, MD (General Surgery) Earna Coder, MD as Consulting Physician (Hematology and Oncology) Otho Ket, RN as [facility] Management  Chief Complaint  Patient presents with   Follow-up    DOE. No wheezing. Cough with yellow sputum.     HPI Patient is a 72 year old lifelong never smoker with a history of moderate persistent asthma who presents today as a scheduled visit.  I last saw the patient on 11 October 2022.  She has been maintained on Trelegy Ellipta 100, 1 inhalation daily for her asthma.  She is also on rescue AirSupra.  She saw our nurse practitioner Rubye Oaks on 06 Sep 2022 for a sleep consult.  She has been diagnosed with severe sleep apnea and has started CPAP therapy.  She will be followed by our sleep medicine team for that issue.  She has been doing well with the CPAP.  Since her prior visit she has not had any wheezing.  She does complain of increased dyspnea on exertion.  She notes a deep cough with chronic yellowish sputum production.  No hemoptysis. No chest pain, no orthopnea or paroxysmal nocturnal dyspnea.  No lower extremity edema nor calf tenderness.  She notes that Trelegy Ellipta does help her but could be "stronger".  She uses AirSupra rescue 2-3 times per week.  Her main complaint is that of fatigue, I explained to her that I think that this is multifactorial.  Has been diagnosed with myelodysplastic syndrome and has a significant macrocytic anemia.   She does not endorse any other symptomatology today.  Wishes to have flu vaccine today.   Review of Systems A 10 point review of systems was performed and it is as noted above otherwise negative.    Patient Active Problem List   Diagnosis Date Noted   Bilateral lower extremity edema 12/02/2022   OSA (obstructive sleep apnea) 09/06/2022   Hyperglycemia 08/15/2022   Snoring 06/03/2022   Hypersomnia 05/18/2022   Suprapubic cramping 05/18/2022   Overactive bladder 05/18/2022   MDS (myelodysplastic syndrome), low grade (HCC) 04/20/2022   Tubular adenoma of colon 02/07/2022   Cervical myelopathy (HCC) 11/10/2021   Spinal stenosis, cervical region 09/15/2021   Adenomatous polyp of transverse colon    Polyp of colon    Thrombocytopenia (HCC) 01/13/2021   Essential hypertension 12/20/2020   Macrocytic anemia 12/18/2020   Allergic rhinitis 12/13/2020   Fatigue 12/13/2020   Insomnia 12/13/2020   Grief reaction 08/09/2020   Cognitive complaints 08/09/2020   Neuropathy 06/14/2020   Educated about COVID-19 virus infection 04/10/2019   Prediabetes 10/13/2018   LVH (left ventricular hypertrophy) due to hypertensive disease, without heart failure 10/13/2018   Hospital discharge follow-up 07/08/2018   Hypothyroid 06/15/2017   Post herpetic neuralgia 06/06/2017   Encounter for preventive health examination 09/09/2014   Chronic GERD 09/09/2014   Hyperlipidemia 08/17/2014   S/P hysterectomy 04/28/2013   Initial Medicare annual wellness visit 04/28/2013   Depression     Social History   Tobacco Use   Smoking status: Never   Smokeless tobacco: Never  Substance Use Topics   Alcohol use: No    No Known Allergies  Current Meds  Medication Sig  albuterol (VENTOLIN HFA) 108 (90 Base) MCG/ACT inhaler Inhale 1-2 puffs into the lungs every 4 (four) hours as needed for wheezing or shortness of breath.   amLODipine (NORVASC) 5 MG tablet TAKE 1 TABLET BY MOUTH DAILY   Calcium Citrate-Vitamin D (CALCIUM CITRATE + D PO) Take 1 tablet by mouth 2 (two) times daily.   citalopram (CELEXA) 20 MG tablet TAKE 1 TABLET BY MOUTH DAILY   famotidine (PEPCID) 20 MG tablet TAKE 1 TABLET BY MOUTH  DAILY. BEFORE DINNER   ferrous sulfate 324 MG TBEC Take 2 tablets by mouth. 65 mg of elemental iron   fexofenadine (ALLEGRA) 180 MG tablet Take 180 mg by mouth daily.   Fluticasone-Umeclidin-Vilant (TRELEGY ELLIPTA) 100-62.5-25 MCG/ACT AEPB Inhale 1 puff into the lungs daily.   gabapentin (NEURONTIN) 100 MG capsule TAKE 1 CAPSULE BY MOUTH 3 TIMES  DAILY   levothyroxine (SYNTHROID) 50 MCG tablet TAKE 1 TABLET BY MOUTH DAILY ON  EMPTY STOMACH WITH A GLASS OF  WATER AT LEAST 30 TO 60 MINUTES  BEFORE BREAKFAST   pantoprazole (PROTONIX) 40 MG tablet TAKE 1 TABLET BY MOUTH DAILY   rosuvastatin (CRESTOR) 10 MG tablet Take 1 tablet (10 mg total) by mouth at bedtime. Home med.   solifenacin (VESICARE) 5 MG tablet TAKE ONE TABLET BY MOUTH EVERY DAY   traZODone (DESYREL) 50 MG tablet TAKE 1/2 TO 1 TABLET BY MOUTH AT BEDTIME AS NEEDED FOR SLEEP   vitamin B-12 (CYANOCOBALAMIN) 1000 MCG tablet Take 1,000 mcg by mouth daily.    Immunization History  Administered Date(s) Administered   Fluad Quad(high Dose 65+) 12/06/2018   Influenza, High Dose Seasonal PF 04/22/2016, 06/05/2017   Influenza,inj,Quad PF,6+ Mos 04/26/2013, 01/30/2015   Influenza-Unspecified 01/24/2018, 12/06/2018, 01/02/2020, 04/02/2021, 02/03/2022   Moderna Covid-19 Vaccine Bivalent Booster 50yrs & up 02/05/2021, 02/03/2022   Moderna Sars-Covid-2 Vaccination 06/20/2019, 07/22/2019, 02/14/2020   PNEUMOCOCCAL CONJUGATE-20 11/09/2020   Pneumococcal Conjugate-13 09/01/2014, 01/24/2018   Pneumococcal Polysaccharide-23 04/26/2013, 02/28/2019   Rsv, Bivalent, Protein Subunit Rsvpref,pf Verdis Frederickson) 05/26/2022   Tdap 06/18/2014, 05/26/2022   Zoster Recombinant(Shingrix) 07/08/2017, 09/22/2017        Objective:     BP 122/78 (BP Location: Right Arm, Cuff Size: Normal)   Pulse 70   Temp 97.9 F (36.6 C)   Ht 5\' 3"  (1.6 m)   Wt 181 lb 3.2 oz (82.2 kg)   SpO2 97%   BMI 32.10 kg/m   SpO2: 97 % O2 Device: None (Room air)  GENERAL:  Overweight woman, no acute distress.  Fully ambulatory, no conversational dyspnea, very pale. HEAD: Normocephalic, atraumatic.  EYES: Pupils equal, round, reactive to light.  No scleral icterus.  Pale conjunctiva. MOUTH: Natural dentition present, few chipped teeth.  Oral mucosa moist. NECK: Supple. No thyromegaly. Trachea midline. No JVD.  No adenopathy. PULMONARY: Lungs sound coarse but otherwise clear to auscultation bilaterally. CARDIOVASCULAR: S1 and S2. Regular rate and rhythm.  No rubs, murmurs gallops heard. GASTROINTESTINAL: Benign. MUSCULOSKELETAL: No joint deformity, no clubbing, no edema.  NEUROLOGIC: No overt focal deficits. SKIN: Intact,warm,dry. PSYCH: Mood and behavior appropriate.  Lab Results  Component Value Date   NITRICOXIDE 7 01/16/2023     Assessment & Plan:     ICD-10-CM   1. Moderate persistent asthma without complication  J45.40 Nitric oxide    Flu Vaccine Trivalent High Dose (Fluad)    AMB referral to pulmonary rehabilitation   Persistent symptoms with cough of white sputum Increase Trelegy to 200 strength Continue AirSupra as needed  2. Dyspnea, unspecified type  R06.00    Multifactorial, he noted as fatigue Suspect mostly due to anemia/MDS Other factors: Asthma/deconditioning Referred to pulmonary rehab    3. Macrocytic anemia  D53.9    This issue adds complexity to her management Adds to her symptom of dyspnea    4. MDS (myelodysplastic syndrome), low grade (HCC)  D46.Z    This issue adds complexity to her management Follows with hematology    5. OSA (obstructive sleep apnea)  G47.33    Severe OSA with AHI at 35.9/hour and SpO2 low at 74%.   Compliant with CPAP Follows with our sleep medicine team    6. Need for influenza vaccination  Z23    Patient received flu vaccine today     Orders Placed This Encounter  Procedures   Flu Vaccine Trivalent High Dose (Fluad)   AMB referral to pulmonary rehabilitation    Referral Priority:    Routine    Referral Type:   Consultation    Number of Visits Requested:   1   Nitric oxide   Meds ordered this encounter  Medications   Fluticasone-Umeclidin-Vilant (TRELEGY ELLIPTA) 200-62.5-25 MCG/ACT AEPB    Sig: Inhale 1 puff into the lungs daily.    Dispense:  28 each    Refill:  0    Order Specific Question:   Lot Number?    Answer:   [id number]    Order Specific Question:   Expiration Date?    Answer:   04/25/2024    Order Specific Question:   Quantity    Answer:   2   We have made a referral to pulmonary rehab.  Will see the patient in follow-up in 4 months time.  She is to contact us prior to that time should any new difficulties arise.   Gailen Shelter, MD Advanced Bronchoscopy PCCM  [facility]    *This note was dictated using voice recognition software/Dragon.  Despite best efforts to proofread, errors can occur which can change the meaning. Any transcriptional errors that result from this process are unintentional and may not be fully corrected at the time of dictation.

## 2023-01-25 DIAGNOSIS — M542 Cervicalgia: Secondary | ICD-10-CM | POA: Diagnosis not present

## 2023-01-25 DIAGNOSIS — M4322 Fusion of spine, cervical region: Secondary | ICD-10-CM | POA: Diagnosis not present

## 2023-01-25 DIAGNOSIS — M5412 Radiculopathy, cervical region: Secondary | ICD-10-CM | POA: Diagnosis not present

## 2023-01-25 DIAGNOSIS — M5451 Vertebrogenic low back pain: Secondary | ICD-10-CM | POA: Diagnosis not present

## 2023-02-09 ENCOUNTER — Telehealth: Payer: Self-pay

## 2023-02-09 NOTE — Telephone Encounter (Signed)
-----   Message from [facility] sent at 01/12/2023  1:55 PM EDT ----- Can you please touch base with her to see if her right sided neck pain is improved?  If it is not compared with her prior visit, we discussed referral to pain management for injections.  She has been trying ibuprofen and muscle relaxants.  She was doing physical therapy approximately once per week.  Thanks, [employer]

## 2023-02-16 NOTE — Telephone Encounter (Signed)
Left message to return call 

## 2023-03-01 DIAGNOSIS — M5412 Radiculopathy, cervical region: Secondary | ICD-10-CM | POA: Diagnosis not present

## 2023-03-01 DIAGNOSIS — M542 Cervicalgia: Secondary | ICD-10-CM | POA: Diagnosis not present

## 2023-03-01 DIAGNOSIS — M4322 Fusion of spine, cervical region: Secondary | ICD-10-CM | POA: Diagnosis not present

## 2023-03-01 DIAGNOSIS — M5451 Vertebrogenic low back pain: Secondary | ICD-10-CM | POA: Diagnosis not present

## 2023-03-02 ENCOUNTER — Encounter: Payer: Self-pay | Admitting: Medical

## 2023-03-02 ENCOUNTER — Ambulatory Visit: Payer: Medicare Other | Attending: Medical | Admitting: Medical

## 2023-03-02 ENCOUNTER — Telehealth: Payer: Self-pay | Admitting: Pulmonary Disease

## 2023-03-02 VITALS — BP 122/70 | HR 74 | Ht 63.0 in | Wt 181.4 lb

## 2023-03-02 DIAGNOSIS — I2510 Atherosclerotic heart disease of native coronary artery without angina pectoris: Secondary | ICD-10-CM | POA: Diagnosis not present

## 2023-03-02 DIAGNOSIS — I5032 Chronic diastolic (congestive) heart failure: Secondary | ICD-10-CM

## 2023-03-02 DIAGNOSIS — I10 Essential (primary) hypertension: Secondary | ICD-10-CM | POA: Diagnosis not present

## 2023-03-02 DIAGNOSIS — E782 Mixed hyperlipidemia: Secondary | ICD-10-CM | POA: Diagnosis not present

## 2023-03-02 MED ORDER — TRELEGY ELLIPTA 200-62.5-25 MCG/ACT IN AEPB: 1.0000 | INHALATION_SPRAY | Freq: Every day | RESPIRATORY_TRACT | 6 refills | Status: DC

## 2023-03-02 MED ORDER — TRELEGY ELLIPTA 200-62.5-25 MCG/ACT IN AEPB: 1.0000 | INHALATION_SPRAY | Freq: Every day | RESPIRATORY_TRACT | 0 refills | Status: DC

## 2023-03-02 NOTE — Telephone Encounter (Signed)
Trelegy 200 is working better andsend rx to [facility]

## 2023-03-02 NOTE — Progress Notes (Signed)
Cardiology Office Note:    Date:  03/02/2023   ID:  [name], DOB 04/27/1950, MRN [id number]  PCP:  Sherlene Shams, MD  [facility] Cardiologist:  Debbe Odea, MD  [facility] Electrophysiologist:  None   Referring MD: Sherlene Shams, MD   Chief Complaint: 3 month follow-up  History of Present Illness:    Sarah [name] is a 72 y.o. female with a hx of hypertension, hyperlipidemia, hypothyroidism, MDS and macrocytic anemia followed by oncology, asthma who presents for 3 month follow-up.   Echo in 2020 showed EF of 60 to 65%.  Lexiscan Myoview in 2020 was low risk with no ischemia.   Patient was seen as a new patient in June 2024 for shortness of breath.  Patient had been admitted to the hospital 2 months prior with viral pneumonia.  She denied any chest pain.  Echocardiogram and cardiac CT were ordered.  Cardiac CTA showed a coronary calcium score 119, 60th percentile for age and sex matched, mild proximal LAD stenosis 25 to 49%, overall mild nonobstructive CAD.   The patient was last seen 10/2022 reporting shortness of breath, suspected from MDS/anemia/thrombocytopenia.   Today, the patient repots chest congestion and cough. No fever or chills. Repots chronic unchanged shortness of breath. Denies chest pain. She is using CPAP. She follows with pulmonology. Lungs are clear on exam today, I recommend she try allergy medication. She reports dependent lower leg edema and is wearing compression socks. She eats low salt diet.   Past Medical History:  Diagnosis Date   Arthritis    Asthma    CAP (community acquired pneumonia) 08/11/2022   Depression    Dyspnea    with heat   Environmental and seasonal allergies    GERD (gastroesophageal reflux disease)    Hyperlipidemia    Hypertension    Hypothyroidism    Presence of dental prosthetic device    Dental implants   Wheezing     Past Surgical History:  Procedure Laterality Date   ABDOMINAL HYSTERECTOMY   04/26/1987   ANTERIOR CERVICAL DECOMPRESSION/DISCECTOMY FUSION 4 LEVELS N/A 11/10/2021   Procedure: C3-7 ANTERIOR CERVICAL DISCECTOMY AND FUSION (GLOBUS FORGE);  Surgeon: Venetia Night, MD;  Location: [facility] OR[facility];  Service: Neurosurgery;  Laterality: N/A;   BROW LIFT Bilateral 02/07/2020   Procedure: BLEPHAROPLASTY UPPER EYELID; W/EXCESS SKIN BROW PTOSIS REPAIR BILATERAL;  Surgeon: Imagene Riches, MD;  Location: [facility];  Service: Ophthalmology;  Laterality: Bilateral;   CATARACT EXTRACTION W/PHACO Right 09/13/2016   Procedure: CATARACT EXTRACTION PHACO AND INTRAOCULAR LENS PLACEMENT (IOC);  Surgeon: Galen Manila, MD;  Location: [facility];  Service: Ophthalmology;  Laterality: Right;  [place] 00:38 AP% 14.3 CDE 5.51 Fluid pack lot # [id number]   CATARACT EXTRACTION W/PHACO Left 10/11/2016   Procedure: CATARACT EXTRACTION PHACO AND INTRAOCULAR LENS PLACEMENT (IOC);  Surgeon: Galen Manila, MD;  Location: [facility];  Service: Ophthalmology;  Laterality: Left;  [place] 00:30 AP% 11.3 CDE 3.50 fluid pack lot # [id number]   COLONOSCOPY WITH PROPOFOL N/A 09/30/2014   Procedure: COLONOSCOPY WITH PROPOFOL;  Surgeon: Earline Mayotte, MD;  Location: [facility]COPY;  Service: Endoscopy;  Laterality: N/A;   COLONOSCOPY WITH PROPOFOL N/A 03/24/2021   Procedure: COLONOSCOPY WITH PROPOFOL;  Surgeon: Toney Reil, MD;  Location: [facility] ENDOSCOPY;  Service: Gastroenterology;  Laterality: N/A;   ESOPHAGOGASTRODUODENOSCOPY N/A 09/30/2014   Procedure: ESOPHAGOGASTRODUODENOSCOPY (EGD);  Surgeon: Earline Mayotte, MD;  Location: [facility] ENDOSCOPY;  Service: Endoscopy;  Laterality: N/A;  ESOPHAGOGASTRODUODENOSCOPY N/A 03/24/2021   Procedure: ESOPHAGOGASTRODUODENOSCOPY (EGD);  Surgeon: Toney Reil, MD;  Location: [facility] ENDOSCOPY;  Service: Gastroenterology;  Laterality: N/A;   FOOT SURGERY Bilateral 04/26/1983   KNEE SURGERY  04/25/2014    Current Medications: Current Meds  Medication Sig    albuterol (VENTOLIN HFA) 108 (90 Base) MCG/ACT inhaler Inhale 1-2 puffs into the lungs every 4 (four) hours as needed for wheezing or shortness of breath.   amLODipine (NORVASC) 5 MG tablet TAKE 1 TABLET BY MOUTH DAILY   Calcium Citrate-Vitamin D (CALCIUM CITRATE + D PO) Take 1 tablet by mouth 2 (two) times daily.   citalopram (CELEXA) 20 MG tablet TAKE 1 TABLET BY MOUTH DAILY   famotidine (PEPCID) 20 MG tablet TAKE 1 TABLET BY MOUTH DAILY. BEFORE DINNER   ferrous sulfate 324 MG TBEC Take 2 tablets by mouth. 65 mg of elemental iron   fexofenadine (ALLEGRA) 180 MG tablet Take 180 mg by mouth daily.   Fluticasone-Umeclidin-Vilant (TRELEGY ELLIPTA) 200-62.5-25 MCG/ACT AEPB Inhale 1 puff into the lungs daily.   gabapentin (NEURONTIN) 100 MG capsule TAKE 1 CAPSULE BY MOUTH 3 TIMES  DAILY   levothyroxine (SYNTHROID) 50 MCG tablet TAKE 1 TABLET BY MOUTH DAILY ON  EMPTY STOMACH WITH A GLASS OF  WATER AT LEAST 30 TO 60 MINUTES  BEFORE BREAKFAST   pantoprazole (PROTONIX) 40 MG tablet TAKE 1 TABLET BY MOUTH DAILY   rosuvastatin (CRESTOR) 10 MG tablet Take 1 tablet (10 mg total) by mouth at bedtime. Home med.   solifenacin (VESICARE) 5 MG tablet TAKE ONE TABLET BY MOUTH EVERY DAY   traZODone (DESYREL) 50 MG tablet TAKE 1/2 TO 1 TABLET BY MOUTH AT BEDTIME AS NEEDED FOR SLEEP   vitamin B-12 (CYANOCOBALAMIN) 1000 MCG tablet Take 1,000 mcg by mouth daily.     Allergies:   Patient has no known allergies.   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 No smoking; no alcohol. Lives in [place], [place] lives self/kitty.    Social Determinants of Health   Financial Resource Strain: Low Risk  (05/12/2022)   Overall Financial Resource Strain (CARDIA)    Difficulty of Paying Living Expenses: Not very hard  Food Insecurity: No Food Insecurity (09/02/2022)   Hunger Vital Sign    Worried About Running Out of Food in the Last Year: Never true    Ran Out of Food in the Last Year: Never true  Transportation Needs: No Transportation Needs (09/02/2022)   PRAPARE - Administrator, Civil Service (Medical): No    Lack of Transportation (Non-Medical): No  Physical Activity: Insufficiently Active (05/12/2022)   Exercise Vital Sign    Days of Exercise per Week: 1 day    Minutes of Exercise per Session: 60 min  Stress: No Stress Concern Present (05/12/2022)   [employer] of Occupational Health - Occupational Stress Questionnaire  Feeling of Stress : Not at all  Social Connections: Unknown (05/12/2022)   Social Connection and Isolation Panel [NHANES]    Frequency of Communication with Friends and Family: More than three times a week    Frequency of Social Gatherings with Friends and Family: More than three times a week    Attends Religious Services: More than 4 times per year    Active Member of [employer] or Organizations: Yes    Attends Engineer, structural: More than 4 times per year    Marital Status: Not on file     Family History: The patient's family history includes Diabetes in her mother; Heart disease in her mother; Hyperlipidemia in her mother. There is no history of Prostate cancer, Kidney cancer, or Bladder Cancer.  ROS:   Please see the history of present illness.     All other systems reviewed and are negative.  EKGs/Labs/Other Studies Reviewed:    The following studies were reviewed today:  Cardiac CT 10/2022 IMPRESSION: 1. Coronary calcium score of 119. This was 68th  percentile for age and sex matched control.   2. Normal coronary origin with right dominance.   3. Mild proximal LAD stenosis (25-49%).   4. CAD-RADS 2. Mild non-obstructive CAD (25-49%). Consider non-atherosclerotic causes of chest pain. Consider preventive therapy and risk factor modification.   Echo 10/2022 1. Left ventricular ejection fraction, by estimation, is 55 to 60%. Left  ventricular ejection fraction by 2D MOD biplane is 58.5 %. The left  ventricle has normal function. The left ventricle has no regional wall  motion abnormalities. Left ventricular  diastolic parameters are consistent with Grade II diastolic dysfunction  (pseudonormalization).   2. Right ventricular systolic function is normal. The right ventricular  size is normal. There is mildly elevated pulmonary artery systolic  pressure.   3. Left atrial size was mildly dilated.   4. The mitral valve is normal in structure. Mild mitral valve  regurgitation.   5. The aortic valve was not well visualized. Aortic valve regurgitation  is not visualized. Aortic valve mean gradient measures 6.0 mmHg.   6. The inferior vena cava is normal in size with <50% respiratory  variability, suggesting right atrial pressure of 8 mmHg.     EKG:  EKG is not ordered today.    Recent Labs: 08/19/2022: Magnesium 2.2 11/21/2022: TSH 0.82 11/25/2022: ALT 15; BUN 18; Creatinine 0.74; Hemoglobin 9.8; Platelet Count 117; Potassium 3.7; Sodium 137  Recent Lipid Panel    Component Value Date/Time   CHOL 152 11/21/2022 1406   TRIG 58.0 11/21/2022 1406   HDL 72.40 11/21/2022 1406   CHOLHDL 2 11/21/2022 1406   VLDL 11.6 11/21/2022 1406   LDLCALC 68 11/21/2022 1406   LDLCALC 142 (H) 04/06/2018 1520   LDLDIRECT 58.0 11/21/2022 1406     Physical Exam:    VS:  BP 122/70 (BP Location: Left Arm, Patient Position: Sitting, Cuff Size: Normal)   Pulse 74   Ht 5\' 3"  (1.6 m)   Wt 181 lb 6.4 oz (82.3 kg)   SpO2 94%   BMI 32.13 kg/m     Wt  Readings from Last 3 Encounters:  03/02/23 181 lb 6.4 oz (82.3 kg)  01/16/23 181 lb 3.2 oz (82.2 kg)  01/12/23 179 lb (81.2 kg)     GEN:  Well nourished, well developed in no acute distress HEENT: Normal NECK: No JVD; No carotid bruits LYMPHATICS: No lymphadenopathy CARDIAC: RRR, no murmurs, rubs, gallops RESPIRATORY:  Clear to  auscultation without rales, wheezing or rhonchi  ABDOMEN: Soft, non-tender, non-distended MUSCULOSKELETAL:  No edema; No deformity  SKIN: Warm and dry NEUROLOGIC:  Alert and oriented x 3 PSYCHIATRIC:  Normal affect   ASSESSMENT:    1. Coronary artery disease involving native coronary artery of native heart without angina pectoris   2. Chronic diastolic heart failure (HCC)   3. Essential hypertension   4. Hyperlipidemia, mixed    PLAN:    In order of problems listed above:  Nonobstructive CAD Cardiac CTA earlier this year showed nonobstructive CAD. She denies chest pain, but has chronic DOE. She follows with pulmonology for asthma. Patient follows with oncology for MDS/Anemia/thrombocytopenia. Patient is not on ASA due to these issues. Continue Crestor. No further ischemic work-up at this time.   Diastolic dysfunction Echo showed preserved EF with G2DD. Patient reports dependent edema. Patient follows low salt diet and wears compression socks.   HTN BP is normal today, continue Amlodipine 5mg  daily.   HLD LDL 58. Continue Crestor 10mg  daily.   Disposition: Follow up in 6 month(s) with MD    Signed, Lequan Dobratz David Stall, PA-C  03/02/2023 4:25 PM    [facility]

## 2023-03-02 NOTE — Patient Instructions (Signed)
Medication Instructions:  Your physician recommends that you continue on your current medications as directed. Please refer to the Current Medication list given to you today.   *If you need a refill on your cardiac medications before your next appointment, please call your pharmacy*   Lab Work: No labs ordered today    Testing/Procedures: No test ordered today    Follow-Up: At [facility], you and your health needs are our priority.  As part of our continuing mission to provide you with exceptional heart care, we have created designated Provider Care Teams.  These Care Teams include your primary Cardiologist (physician) and Advanced Practice Providers (APPs -  Physician Assistants and Nurse Practitioners) who all work together to provide you with the care you need, when you need it.  We recommend signing up for the patient portal called "MyChart".  Sign up information is provided on this After Visit Summary.  MyChart is used to connect with patients for Virtual Visits (Telemedicine).  Patients are able to view lab/test results, encounter notes, upcoming appointments, etc.  Non-urgent messages can be sent to your provider as well.   To learn more about what you can do with MyChart, go to [email or web address].    Your next appointment:   6 month(s)  Provider:   You may see Debbe Odea, MD or one of the following Advanced Practice Providers on your designated Care Team:   Nicolasa Ducking, NP Eula Listen, PA-C Cadence Fransico Michael, PA-C Charlsie Quest, NP Carlos Levering, NP

## 2023-03-02 NOTE — Telephone Encounter (Signed)
Trelegy 200 sent to preferred pharmacy.  Patient is aware and voiced her understanding.  Nothing further needed.   

## 2023-03-06 NOTE — Telephone Encounter (Signed)
I spoke with Sarah [name]. She reports her neck pain is doing better. I encouraged her to call our office if it starts bothering her again and she decides she would like to pursue injections.

## 2023-03-07 ENCOUNTER — Ambulatory Visit: Payer: Self-pay

## 2023-03-07 NOTE — Patient Outreach (Signed)
  Care Coordination   03/07/2023 Name: Sarah [name] MRN: [id number] DOB: 05/12/1950   Care Coordination Outreach Attempts:  An unsuccessful telephone outreach was attempted for a scheduled appointment today. HIPAA compliant message left with call back phone number.   Follow Up Plan:  Additional outreach attempts will be made to offer the patient care coordination information and services.   Encounter Outcome:  No Answer   Care Coordination Interventions:  No, not indicated    [name] RN,BSN,CCM [facility]  [facility], [facility] coordinator / Case Manager Phone: [phone]

## 2023-03-12 DIAGNOSIS — J209 Acute bronchitis, unspecified: Secondary | ICD-10-CM | POA: Diagnosis not present

## 2023-03-12 DIAGNOSIS — J4521 Mild intermittent asthma with (acute) exacerbation: Secondary | ICD-10-CM | POA: Diagnosis not present

## 2023-03-12 DIAGNOSIS — B9689 Other specified bacterial agents as the cause of diseases classified elsewhere: Secondary | ICD-10-CM | POA: Diagnosis not present

## 2023-03-12 DIAGNOSIS — R509 Fever, unspecified: Secondary | ICD-10-CM | POA: Diagnosis not present

## 2023-03-12 DIAGNOSIS — J019 Acute sinusitis, unspecified: Secondary | ICD-10-CM | POA: Diagnosis not present

## 2023-03-12 DIAGNOSIS — R051 Acute cough: Secondary | ICD-10-CM | POA: Diagnosis not present

## 2023-03-13 DIAGNOSIS — I517 Cardiomegaly: Secondary | ICD-10-CM | POA: Diagnosis not present

## 2023-03-13 DIAGNOSIS — R509 Fever, unspecified: Secondary | ICD-10-CM | POA: Diagnosis not present

## 2023-03-13 DIAGNOSIS — J42 Unspecified chronic bronchitis: Secondary | ICD-10-CM | POA: Diagnosis not present

## 2023-03-13 DIAGNOSIS — R051 Acute cough: Secondary | ICD-10-CM | POA: Diagnosis not present

## 2023-03-15 DIAGNOSIS — M5451 Vertebrogenic low back pain: Secondary | ICD-10-CM | POA: Diagnosis not present

## 2023-03-15 DIAGNOSIS — M542 Cervicalgia: Secondary | ICD-10-CM | POA: Diagnosis not present

## 2023-03-15 DIAGNOSIS — M4322 Fusion of spine, cervical region: Secondary | ICD-10-CM | POA: Diagnosis not present

## 2023-03-15 DIAGNOSIS — M5412 Radiculopathy, cervical region: Secondary | ICD-10-CM | POA: Diagnosis not present

## 2023-03-16 ENCOUNTER — Other Ambulatory Visit: Payer: Self-pay | Admitting: Internal Medicine

## 2023-03-16 DIAGNOSIS — E785 Hyperlipidemia, unspecified: Secondary | ICD-10-CM

## 2023-03-29 ENCOUNTER — Ambulatory Visit: Payer: Medicare Other | Admitting: Internal Medicine

## 2023-03-29 ENCOUNTER — Other Ambulatory Visit: Payer: Medicare Other

## 2023-03-29 DIAGNOSIS — M4322 Fusion of spine, cervical region: Secondary | ICD-10-CM | POA: Diagnosis not present

## 2023-03-29 DIAGNOSIS — M542 Cervicalgia: Secondary | ICD-10-CM | POA: Diagnosis not present

## 2023-03-29 DIAGNOSIS — M5412 Radiculopathy, cervical region: Secondary | ICD-10-CM | POA: Diagnosis not present

## 2023-03-29 DIAGNOSIS — M5451 Vertebrogenic low back pain: Secondary | ICD-10-CM | POA: Diagnosis not present

## 2023-03-31 ENCOUNTER — Telehealth: Payer: Self-pay | Admitting: *Deleted

## 2023-03-31 ENCOUNTER — Inpatient Hospital Stay: Payer: Medicare Other | Attending: Nurse Practitioner

## 2023-03-31 ENCOUNTER — Inpatient Hospital Stay (HOSPITAL_BASED_OUTPATIENT_CLINIC_OR_DEPARTMENT_OTHER): Payer: Medicare Other | Admitting: Nurse Practitioner

## 2023-03-31 ENCOUNTER — Encounter: Payer: Self-pay | Admitting: Nurse Practitioner

## 2023-03-31 VITALS — BP 122/62 | HR 72 | Temp 97.3°F

## 2023-03-31 DIAGNOSIS — Z79899 Other long term (current) drug therapy: Secondary | ICD-10-CM | POA: Insufficient documentation

## 2023-03-31 DIAGNOSIS — D469 Myelodysplastic syndrome, unspecified: Secondary | ICD-10-CM | POA: Insufficient documentation

## 2023-03-31 DIAGNOSIS — D46Z Other myelodysplastic syndromes: Secondary | ICD-10-CM

## 2023-03-31 LAB — CBC WITH DIFFERENTIAL (CANCER CENTER ONLY)
Abs Immature Granulocytes: 0 10*3/uL (ref 0.00–0.07)
Band Neutrophils: 0 %
Basophils Absolute: 0.1 10*3/uL (ref 0.0–0.1)
Basophils Relative: 2 %
Blasts: 0 %
Eosinophils Absolute: 0.3 10*3/uL (ref 0.0–0.5)
Eosinophils Relative: 5 %
HCT: 31.4 % — ABNORMAL LOW (ref 36.0–46.0)
Hemoglobin: 10.4 g/dL — ABNORMAL LOW (ref 12.0–15.0)
Immature Granulocytes: 0 %
Lymphocytes Relative: 17 %
Lymphs Abs: 0.9 10*3/uL (ref 0.7–4.0)
MCH: 37.5 pg — ABNORMAL HIGH (ref 26.0–34.0)
MCHC: 33.1 g/dL (ref 30.0–36.0)
MCV: 113.4 fL — ABNORMAL HIGH (ref 80.0–100.0)
Metamyelocytes Relative: 0 %
Monocytes Absolute: 0.5 10*3/uL (ref 0.1–1.0)
Monocytes Relative: 10 %
Myelocytes: 0 %
Neutro Abs: 3.3 10*3/uL (ref 1.7–7.7)
Neutrophils Relative %: 66 %
Other: 0 %
Platelet Count: 128 10*3/uL — ABNORMAL LOW (ref 150–400)
Promyelocytes Relative: 0 %
RBC: 2.77 MIL/uL — ABNORMAL LOW (ref 3.87–5.11)
RDW: 14.3 % (ref 11.5–15.5)
Smear Review: DECREASED
WBC Count: 5.1 10*3/uL (ref 4.0–10.5)
nRBC: 0 % (ref 0.0–0.2)
nRBC: 0 /100{WBCs}

## 2023-03-31 LAB — IRON AND TIBC
Iron: 96 ug/dL (ref 28–170)
Saturation Ratios: 29 % (ref 10.4–31.8)
TIBC: 332 ug/dL (ref 250–450)
UIBC: 236 ug/dL

## 2023-03-31 LAB — CMP (CANCER CENTER ONLY)
ALT: 42 U/L (ref 0–44)
AST: 20 U/L (ref 15–41)
Albumin: 3.9 g/dL (ref 3.5–5.0)
Alkaline Phosphatase: 92 U/L (ref 38–126)
Anion gap: 9 (ref 5–15)
BUN: 15 mg/dL (ref 8–23)
CO2: 25 mmol/L (ref 22–32)
Calcium: 9.3 mg/dL (ref 8.9–10.3)
Chloride: 106 mmol/L (ref 98–111)
Creatinine: 0.8 mg/dL (ref 0.44–1.00)
GFR, Estimated: >60 mL/min (ref >60)
Glucose, Bld: 103 mg/dL — ABNORMAL HIGH (ref 70–99)
Potassium: 4.1 mmol/L (ref 3.5–5.1)
Sodium: 140 mmol/L (ref 135–145)
Total Bilirubin: 1 mg/dL (ref <1.2)
Total Protein: 6.9 g/dL (ref 6.5–8.1)

## 2023-03-31 LAB — FERRITIN: Ferritin: 72 ng/mL (ref 11–307)

## 2023-03-31 NOTE — Progress Notes (Signed)
[facility] CONSULT NOTE  Patient Care Team: Sherlene Shams, MD as PCP - General (Internal Medicine) Debbe Odea, MD as PCP - Cardiology (Cardiology) Sherlene Shams, MD (Internal Medicine) Lemar Livings Merrily Pew, MD (General Surgery) Earna Coder, MD as Consulting Physician (Hematology and Oncology) Otho Ket, RN as [facility] Care Management  CHIEF COMPLAINTS/PURPOSE OF CONSULTATION: MDS  HEMATOLOGY HISTORY: Oncology History Overview Note  DEC 2023- DIAGNOSIS: LOW GRADE MDS- 5 Q DEL;   # MACROCYTIC ANEMIA [since 2022- April hb 11; MCV-103; platelet- 120s] EGD/ colonoscopy >>> 10 years ago; OCT 2022-slight improvement blood counts/stable; hepatitis HIV negative.  Myeloma panel negative.   BONE MARROW, ASPIRATE, CLOT, CORE: -Hypercellular bone marrow for age with dyspoietic changes -See comment  PERIPHERAL BLOOD: -Macrocytic anemia -Thrombocytopenia  COMMENT:  The overall findings are very concerning for involvement by a low-grade myelodysplastic syndrome.  The differential diagnosis includes changes related to nutritional deficiency, medication, immune mediated process, infection, etc. Correlation with cytogenetic and FISH studies is recommended.  MICROSCOPIC DESCRIPTION:  PERIPHERAL BLOOD SMEAR: The red blood cells display mild anisopoikilocytosis with mild polychromasia.  The white blood cells are normal in number with occasional hypogranular and/or hypolobated neutrophils.  Scattered reactive appearing lymphocytes are present.  The platelets are decreased in number.  BONE MARROW ASPIRATE: Bone marrow particles present. Erythroid precursors: Progressive maturation with occasional late precursors displaying nuclear cytoplasmic dyssynchrony. Granulocytic precursors: Progressive maturation with many mature neutrophils displaying hypogranulation and/or hypolobation.  No increase in blastic cells identified. Megakaryocytes:  Abundant with many small and/or hypolobated/unilobated forms Lymphocytes/plasma cells: Large aggregates not present  TOUCH PREPARATIONS: A mixture of cell types present.  CLOT AND BIOPSY: The sections show variable cellularity ranging from 10% focally to 70% with a mixture of cell types.  Expansile sheets of blastic cells are not identified.  A small interstitial lymphoid aggregate composed of small lymphoid cells is seen.  IRON STAIN: Iron stains are performed on a bone marrow aspirate or touch imprint smear and section of clot. The controls stained appropriately.       Storage Iron:  Abundant      Ring Sideroblasts: Absent  ADDITIONAL DATA/TESTING: The specimen was sent for cytogenetic analysis and FISH for MDS and a separate report will follow. Flow cytometric analysis (WLS [id number]) was performed and shows T cells with nonspecific changes.  No monoclonal B-cell population or significant CD34 positive blastic population identified.     #Incidental right hepatic lobe 13 mm hypoechoic lesion [ultrasound-October 2022]-MRI OCT 2022-   Low clinical concern for metastatic disease; however need to rule out primary HCC versus others.  1. There are 3 small liver lesions, 2 of which represent simple cysts. 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 MAY 2023-abdominal MRI -benign cyst no further surveillance recommended.   #October 2022-ultrasound incidental bilateral mild to moderate hydronephrosis.-Rs/p evaluation with Dr.Brandon urology.   MDS (myelodysplastic syndrome), low grade (HCC)  04/20/2022 Initial Diagnosis   MDS (myelodysplastic syndrome), low grade (HCC)    HISTORY OF PRESENTING ILLNESS: Ambulating independently.   Sarah [name] 72 y.o. female with low grade MDS with 5q del, returns to clinic for follow up. In interim, she had sinus infection and has some persistent cough but otherwise no illness.  Denies black or bloody stools. No nausea or vomiting. Fatigue is stable.    Review of Systems  Constitutional:  Positive for malaise/fatigue. Negative for chills, diaphoresis, fever  and weight loss.  HENT:  Negative for nosebleeds and sore throat.   Eyes:  Negative for double vision.  Respiratory:  Negative for cough, hemoptysis, sputum production, shortness of breath and wheezing.   Cardiovascular:  Negative for chest pain, palpitations, orthopnea and leg swelling.  Gastrointestinal:  Negative for abdominal pain, blood in stool, constipation, diarrhea, heartburn, melena, nausea and vomiting.  Genitourinary:  Negative for dysuria, frequency and urgency.  Musculoskeletal:  Positive for joint pain. Negative for back pain.  Skin: Negative.  Negative for itching and rash.  Neurological:  Negative for dizziness, tingling, focal weakness, weakness and headaches.  Endo/Heme/Allergies:  Bruises/bleeds easily.  Psychiatric/Behavioral:  Negative for depression. The patient is not nervous/anxious and does not have insomnia.     MEDICAL HISTORY:  Past Medical History:  Diagnosis Date   Arthritis    Asthma    CAP (community acquired pneumonia) 08/11/2022   Depression    Dyspnea    with heat   Environmental and seasonal allergies    GERD (gastroesophageal reflux disease)    Hyperlipidemia    Hypertension    Hypothyroidism    Presence of dental prosthetic device    Dental implants   Wheezing     SURGICAL HISTORY: Past Surgical History:  Procedure Laterality Date   ABDOMINAL HYSTERECTOMY  04/26/1987   ANTERIOR CERVICAL DECOMPRESSION/DISCECTOMY FUSION 4 LEVELS N/A 11/10/2021   Procedure: C3-7 ANTERIOR CERVICAL DISCECTOMY AND FUSION (GLOBUS FORGE);  Surgeon: Venetia Night, MD;  Location: [facility] OR[facility];  Service: Neurosurgery;  Laterality: N/A;   BROW LIFT Bilateral 02/07/2020   Procedure: BLEPHAROPLASTY UPPER EYELID; W/EXCESS SKIN BROW PTOSIS REPAIR BILATERAL;  Surgeon: Imagene Riches, MD;   Location: [facility];  Service: Ophthalmology;  Laterality: Bilateral;   CATARACT EXTRACTION W/PHACO Right 09/13/2016   Procedure: CATARACT EXTRACTION PHACO AND INTRAOCULAR LENS PLACEMENT (IOC);  Surgeon: Galen Manila, MD;  Location: [facility];  Service: Ophthalmology;  Laterality: Right;  [place] 00:38 AP% 14.3 CDE 5.51 Fluid pack lot # [id number]   CATARACT EXTRACTION W/PHACO Left 10/11/2016   Procedure: CATARACT EXTRACTION PHACO AND INTRAOCULAR LENS PLACEMENT (IOC);  Surgeon: Galen Manila, MD;  Location: [facility];  Service: Ophthalmology;  Laterality: Left;  [place] 00:30 AP% 11.3 CDE 3.50 fluid pack lot # [id number]   COLONOSCOPY WITH PROPOFOL N/A 09/30/2014   Procedure: COLONOSCOPY WITH PROPOFOL;  Surgeon: Earline Mayotte, MD;  Location: [facility];  Service: Endoscopy;  Laterality: N/A;   COLONOSCOPY WITH PROPOFOL N/A 03/24/2021   Procedure: COLONOSCOPY WITH PROPOFOL;  Surgeon: Toney Reil, MD;  Location: [facility] END[facility]COPY;  Service: Gastroenterology;  Laterality: N/A;   ESOPHAGOGASTRODUODENOSCOPY N/A 09/30/2014   Procedure: ESOPHAGOGASTRODUODENOSCOPY (EGD);  Surgeon: Earline Mayotte, MD;  Location[facility];  Service: Endoscopy;  Laterality: N/A;   ESOPHAGOGASTRODUODENOSCOPY N/A 03/24/2021   Procedure: ESOPHAGOGASTRODUODENOSCOPY (EGD);  Surgeon: Toney Reil, MD;  Location: [facility] END[facility];  Service: Gastroenterology;  Laterality: N/A;   FOOT SURGERY Bilateral 04/26/1983   KNEE SURGERY  04/25/2014    SOCIAL HISTORY: Social History   Socioeconomic History   Marital status: Single    Spouse name: Not on file   Number of children: Not on file   Years of education: Not on file   Highest education level: Not on file  Occupational History   Not on file  Tobacco Use   Smoking status: Never   Smokeless tobacco: Never  Vaping Use   Vaping status: Never Used  Substance and Sexual Activity   Alcohol use: No  Drug use: No   Sexual activity: Never   Other Topics Concern   Not on file  Social History Narrative   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 No smoking; no alcohol. Lives in [place], [place] lives self/kitty.    Social Determinants of Health   Financial Resource Strain: Low Risk  (05/12/2022)   Overall Financial Resource Strain (CARDIA)    Difficulty of Paying Living Expenses: Not very hard  Food Insecurity: No Food Insecurity (09/02/2022)   Hunger Vital Sign    Worried About Running Out of Food in the Last Year: Never true    Ran Out of Food in the Last Year: Never true  Transportation Needs: No Transportation Needs (09/02/2022)   PRAPARE - Administrator, Civil Service (Medical): No    Lack of Transportation (Non-Medical): No  Physical Activity: Insufficiently Active (05/12/2022)   Exercise Vital Sign    Days of Exercise per Week: 1 day    Minutes of Exercise per Session: 60 min  Stress: No Stress Concern Present (05/12/2022)   [employer] of Occupational Health - Occupational Stress Questionnaire    Feeling of Stress : Not at all  Social Connections: Unknown (05/12/2022)   Social Connection and Isolation Panel [NHANES]    Frequency of Communication with Friends and Family: More than three times a week    Frequency of Social Gatherings with Friends and Family: More than three times a week    Attends Religious Services: More than 4 times per year    Active Member of [employer] or Organizations: Yes    Attends Banker Meetings: More than 4 times per year    Marital Status: Not on file  Intimate Partner Violence: Not At Risk (08/15/2022)   Humiliation, Afraid, Rape, and Kick questionnaire    Fear of Current or Ex-Partner: No    Emotionally Abused: No    Physically Abused: No    Sexually Abused: No    FAMILY HISTORY: Family History  Problem Relation Age of Onset   Diabetes Mother    Hyperlipidemia Mother    Heart disease Mother    Prostate cancer Neg Hx    Kidney cancer Neg Hx     Bladder Cancer Neg Hx     ALLERGIES:  has No Known Allergies.  MEDICATIONS:  Current Outpatient Medications  Medication Sig Dispense Refill   albuterol (VENTOLIN HFA) 108 (90 Base) MCG/ACT inhaler Inhale 1-2 puffs into the lungs every 4 (four) hours as needed for wheezing or shortness of breath. 18 g 3   amLODipine (NORVASC) 5 MG tablet TAKE 1 TABLET BY MOUTH DAILY 100 tablet 2   Calcium Citrate-Vitamin D (CALCIUM CITRATE + D PO) Take 1 tablet by mouth 2 (two) times daily.     citalopram (CELEXA) 20 MG tablet TAKE 1 TABLET BY MOUTH DAILY 100 tablet 3   famotidine (PEPCID) 20 MG tablet TAKE 1 TABLET BY MOUTH DAILY. BEFORE DINNER 30 tablet 11   ferrous sulfate 324 MG TBEC Take 2 tablets by mouth. 65 mg of elemental iron     fexofenadine (ALLEGRA) 180 MG tablet Take 180 mg by mouth daily.     Fluticasone-Umeclidin-Vilant (TRELEGY ELLIPTA) 200-62.5-25 MCG/ACT AEPB Inhale 1 puff into the lungs daily. 60 each 6   Fluticasone-Umeclidin-Vilant (TRELEGY ELLIPTA) 200-62.5-25 MCG/ACT AEPB Inhale 1 puff into the lungs daily. 14 each 0   gabapentin (NEURONTIN) 100 MG capsule TAKE 1 CAPSULE BY MOUTH 3 TIMES  DAILY 300 capsule 2   levothyroxine (SYNTHROID)  50 MCG tablet TAKE 1 TABLET BY MOUTH DAILY ON  EMPTY STOMACH WITH A GLASS OF  WATER AT LEAST 30 TO 60 MINUTES  BEFORE BREAKFAST 100 tablet 2   pantoprazole (PROTONIX) 40 MG tablet TAKE 1 TABLET BY MOUTH DAILY 100 tablet 2   rosuvastatin (CRESTOR) 10 MG tablet TAKE ONE TABLET BY MOUTH EVERY DAY 100 tablet 1   solifenacin (VESICARE) 5 MG tablet TAKE ONE TABLET BY MOUTH EVERY DAY 30 tablet 2   traZODone (DESYREL) 50 MG tablet TAKE 1/2 TO 1 TABLET BY MOUTH AT BEDTIME AS NEEDED FOR SLEEP 90 tablet 3   vitamin B-12 (CYANOCOBALAMIN) 1000 MCG tablet Take 1,000 mcg by mouth daily.     No current facility-administered medications for this visit.    PHYSICAL EXAMINATION: Vitals:   03/31/23 1049  BP: 122/62  Pulse: 72  Temp: (!) 97.3 F (36.3 C)  SpO2: 93%    There were no vitals filed for this visit.  Physical Exam Vitals reviewed.  Constitutional:      Appearance: She is not ill-appearing.  HENT:     Head: Normocephalic.     Mouth/Throat:     Pharynx: Oropharynx is clear.  Cardiovascular:     Rate and Rhythm: Normal rate and regular rhythm.  Pulmonary:     Comments: Decreased breath sounds bilaterally.  Abdominal:     General: There is no distension.     Palpations: Abdomen is soft.  Musculoskeletal:        General: No deformity.  Skin:    General: Skin is warm.     Coloration: Skin is not pale.  Neurological:     Mental Status: She is alert and oriented to person, place, and time.  Psychiatric:        Mood and Affect: Mood normal.        Behavior: Behavior normal.     LABORATORY DATA:  I have reviewed the data as listed Lab Results  Component Value Date   WBC 5.1 03/31/2023   HGB 10.4 (L) 03/31/2023   HCT 31.4 (L) 03/31/2023   MCV 113.4 (H) 03/31/2023   PLT 128 (L) 03/31/2023   Recent Labs    08/15/22 2146 08/16/22 0644 10/14/22 1253 11/21/22 1406 11/25/22 1411 03/31/23 1031  NA  --    < > 137 140 137 140  K  --    < > 4.4 4.1 3.7 4.1  CL  --    < > 103 104 105 106  CO2  --    < > 27 29 24 25   GLUCOSE  --    < > 83 69* 151* 103*  BUN  --    < > 18 17 18 15   CREATININE 0.85   < > 0.75 0.77 0.74 0.80  CALCIUM  --    < > 9.3 9.8 9.1 9.3  GFRNONAA >60   < > >60  --  >60 >60  PROT 7.1   < >  --  6.7 6.8 6.9  ALBUMIN 3.7   < >  --  4.5 4.0 3.9  AST 29   < >  --  11 16 20   ALT 35   < >  --  13 15 42  ALKPHOS 69   < >  --  59 52 92  BILITOT 0.5   < >  --  0.5 0.5 1.0  BILIDIR <0.1  --   --   --   --   --  IBILI NOT CALCULATED  --   --   --   --   --    < > = values in this interval not displayed.   Iron/TIBC/Ferritin/%Sat    Component Value Date/Time   IRON 96 03/31/2023 1031   TIBC 332 03/31/2023 1031   FERRITIN 72 03/31/2023 1031   IRONPCTSAT 29 03/31/2023 1031   IRONPCTSAT 26 05/18/2022 1424    No results found.  Assessment & Plan :   # MDS (myelodysplastic syndrome), low grade- [Feb 2023], mild macrocytic anemia, thrombocytopenia- hmg 11-12, plts 127-140s. Normal WBCs. December 2023- bone marrow biopsy- hypercellular bone marrow for age with dyspoietic changes, no blasts noted. Cytogenetics/FISH positive for 5q deletion. Dec 2023 hmg 10.3, plts 137, wbc normal.   # Today, hmg 10.4. On oral iron. Ferritin 72, iron sat 29. No role for IV iron. Plts 128. Again reviewed the natural history of low grade MDS and 5q- with overall good prognosis, however disease is incurable and treatments are typically indefinite. She has not yet started treatment. On surveillance every 3-4 months given overall stability. If she continues to drop or becomes more symptomatic, would recommend Revlimid 10 mg for 3 weeks on, 1 week off.    # Nonobstructive CAD/ Recent Cardiac CTA showed mild nonobstructive CAD [[facility]]- ok with asprin. However, discussed that bruising might get worse.    # Hx of B12 deficiency- on B12 supplementation- 2024 Elevated B12- continue oral b12 every other day. Check at next visit.     # DISPOSITION:  4 mo- lab (cbc, cmp, ferritin, iron, b12), Dr B- la   No problem-specific Assessment & Plan notes found for this encounter.  All questions were answered. The patient knows to call the clinic with any problems, questions or concerns.    Alinda Dooms, NP 03/31/2023

## 2023-03-31 NOTE — Telephone Encounter (Signed)
Pt saw Lauren and I gave her the cbc and met c , but the ferritin and IIBC stil not released with the numbers and the pt asked me to put in the mail. I got the results and put it in envelope to her house.

## 2023-04-12 DIAGNOSIS — M4322 Fusion of spine, cervical region: Secondary | ICD-10-CM | POA: Diagnosis not present

## 2023-04-12 DIAGNOSIS — M5451 Vertebrogenic low back pain: Secondary | ICD-10-CM | POA: Diagnosis not present

## 2023-04-12 DIAGNOSIS — M542 Cervicalgia: Secondary | ICD-10-CM | POA: Diagnosis not present

## 2023-04-12 DIAGNOSIS — M5412 Radiculopathy, cervical region: Secondary | ICD-10-CM | POA: Diagnosis not present

## 2023-04-13 ENCOUNTER — Encounter: Payer: Self-pay | Admitting: Pulmonary Disease

## 2023-04-21 ENCOUNTER — Telehealth: Payer: Self-pay

## 2023-04-21 DIAGNOSIS — A499 Bacterial infection, unspecified: Secondary | ICD-10-CM | POA: Diagnosis not present

## 2023-04-21 DIAGNOSIS — N390 Urinary tract infection, site not specified: Secondary | ICD-10-CM | POA: Diagnosis not present

## 2023-04-21 NOTE — Telephone Encounter (Signed)
Copied from CRM [id number]. Topic: Clinical - Medication Question >> Apr 21, 2023  9:20 AM Samuel Jester B wrote: Reason for CRM:Pt is requesting a callback because she is needing a medication to help with her current symptoms of it hurting when she pee and her pee stinks. She stated that she believes she may have a bladder infection.

## 2023-04-21 NOTE — Telephone Encounter (Signed)
Spoke with pt to let her know that we can not send in antibiotics without an appt and we would also need to check her urine first to make sure the right antibiotic would be sent in. I have adviased pt to be seen at an urgent care. Pt gave a verbal understanding.

## 2023-05-05 ENCOUNTER — Ambulatory Visit (INDEPENDENT_AMBULATORY_CARE_PROVIDER_SITE_OTHER): Payer: Medicare Other | Admitting: Internal Medicine

## 2023-05-05 ENCOUNTER — Encounter: Payer: Self-pay | Admitting: Internal Medicine

## 2023-05-05 ENCOUNTER — Telehealth: Payer: Self-pay | Admitting: *Deleted

## 2023-05-05 VITALS — BP 126/58 | HR 84 | Ht 63.0 in | Wt 183.2 lb

## 2023-05-05 DIAGNOSIS — Z87440 Personal history of urinary (tract) infections: Secondary | ICD-10-CM

## 2023-05-05 DIAGNOSIS — I2510 Atherosclerotic heart disease of native coronary artery without angina pectoris: Secondary | ICD-10-CM | POA: Insufficient documentation

## 2023-05-05 DIAGNOSIS — R300 Dysuria: Secondary | ICD-10-CM

## 2023-05-05 DIAGNOSIS — J4531 Mild persistent asthma with (acute) exacerbation: Secondary | ICD-10-CM

## 2023-05-05 DIAGNOSIS — J45901 Unspecified asthma with (acute) exacerbation: Secondary | ICD-10-CM | POA: Insufficient documentation

## 2023-05-05 DIAGNOSIS — I2584 Coronary atherosclerosis due to calcified coronary lesion: Secondary | ICD-10-CM | POA: Diagnosis not present

## 2023-05-05 LAB — POCT URINALYSIS DIPSTICK
Bilirubin, UA: NEGATIVE
Blood, UA: NEGATIVE
Glucose, UA: NEGATIVE
Ketones, UA: NEGATIVE
Nitrite, UA: NEGATIVE
Protein, UA: NEGATIVE
Spec Grav, UA: >=1.03 — AB (ref 1.010–1.025)
Urobilinogen, UA: 0.2 U/dL
pH, UA: 5.5 (ref 5.0–8.0)

## 2023-05-05 MED ORDER — ALBUTEROL SULFATE HFA 108 (90 BASE) MCG/ACT IN AERS: 1.0000 | INHALATION_SPRAY | Freq: Four times a day (QID) | RESPIRATORY_TRACT | 11 refills | Status: AC | PRN

## 2023-05-05 MED ORDER — PREDNISONE 10 MG PO TABS: ORAL_TABLET | ORAL | 0 refills | Status: DC

## 2023-05-05 MED ORDER — AZITHROMYCIN 500 MG PO TABS: 500.0000 mg | ORAL_TABLET | Freq: Every day | ORAL | 0 refills | Status: DC

## 2023-05-05 NOTE — Patient Instructions (Signed)
 I am prescribing you a prednisone  taper, a 7 day course of azithromycin ,  And I have refilled your albuterol  inhaler  Please let us  know if you are running low on Trelegy,  you should not go without it     Use  robitussin or Delsym  OTC for daytime cough  Daily use of a probiotic  or a daily serving of yogurt is advised for 3 weeks.

## 2023-05-05 NOTE — Telephone Encounter (Signed)
 Medication Samples have been provided to the patient.  Drug name: Trelegy Ellipta       Strength: 200-62.5-25        Qty: 2 boxes  LOT: [id number]  Exp.Date: 07/2024  Dosing instructions: Take 1 inhalation daily.  The patient has been instructed regarding the correct time, dose, and frequency of taking this medication, including desired effects and most common side effects.   Sharene Silvan 9:21 AM 05/05/2023

## 2023-05-05 NOTE — Progress Notes (Signed)
 Subjective:  Patient ID: Sarah [name], female    DOB: November 03, 1950  Age: 73 y.o. MRN: [id number]  CC: The primary encounter diagnosis was Dysuria. Diagnoses of Coronary artery disease due to calcified coronary lesion, Mild persistent asthma with acute exacerbation, and History of cystitis were also pertinent to this visit.   HPI ANAGHA LOSEKE presents for  Chief Complaint  Patient presents with   Dysuria   Sarah [name] is a 73 yr old female with mild  persistent asthma,  managed by pulmonology with Trelegy ,  CAD by recent coronary CTA (July 2024)  , OSA and hypertension  presents  for follow up on recent UTI treatment   1) burning with urination started Dec 25 .  Was treated with omnicef  by [facility]  for 7 days and told to follow up . Symptoms have resolved.  Urine cultre grew E Coli sensitive to cephalosporins    2) wheezing and cough productive of yellow sputum started 2 days ago.  She states that Cough never resolved after her last hospitalization 6 months ago.  She hasrun out of Trelegy after  rationing her supply  due to [facility] cost of $300 either .  Has not been using AirSupra  or albuterol  nebs.     Outpatient Medications Prior to Visit  Medication Sig Dispense Refill   albuterol  (VENTOLIN  HFA) 108 (90 Base) MCG/ACT inhaler Inhale 1-2 puffs into the lungs every 4 (four) hours as needed for wheezing or shortness of breath. 18 g 3   amLODipine  (NORVASC ) 5 MG tablet TAKE 1 TABLET BY MOUTH DAILY 100 tablet 2   Calcium  Citrate-Vitamin D  (CALCIUM  CITRATE + D PO) Take 1 tablet by mouth 2 (two) times daily.     citalopram  (CELEXA ) 20 MG tablet TAKE 1 TABLET BY MOUTH DAILY 100 tablet 3   famotidine  (PEPCID ) 20 MG tablet TAKE 1 TABLET BY MOUTH DAILY. BEFORE DINNER 30 tablet 11   ferrous sulfate  324 MG TBEC Take 2 tablets by mouth. 65 mg of elemental iron      fexofenadine (ALLEGRA) 180 MG tablet Take 180 mg by mouth daily.     Fluticasone -Umeclidin-Vilant (TRELEGY ELLIPTA ) 200-62.5-25 MCG/ACT  AEPB Inhale 1 puff into the lungs daily. 60 each 6   Fluticasone -Umeclidin-Vilant (TRELEGY ELLIPTA ) 200-62.5-25 MCG/ACT AEPB Inhale 1 puff into the lungs daily. 14 each 0   gabapentin  (NEURONTIN ) 100 MG capsule TAKE 1 CAPSULE BY MOUTH 3 TIMES  DAILY 300 capsule 2   levothyroxine  (SYNTHROID ) 50 MCG tablet TAKE 1 TABLET BY MOUTH DAILY ON  EMPTY STOMACH WITH A GLASS OF  WATER  AT LEAST 30 TO 60 MINUTES  BEFORE BREAKFAST 100 tablet 2   pantoprazole  (PROTONIX ) 40 MG tablet TAKE 1 TABLET BY MOUTH DAILY 100 tablet 2   rosuvastatin  (CRESTOR ) 10 MG tablet TAKE ONE TABLET BY MOUTH EVERY DAY 100 tablet 1   solifenacin  (VESICARE ) 5 MG tablet TAKE ONE TABLET BY MOUTH EVERY DAY 30 tablet 2   traZODone  (DESYREL ) 50 MG tablet TAKE 1/2 TO 1 TABLET BY MOUTH AT BEDTIME AS NEEDED FOR SLEEP 90 tablet 3   vitamin B-12 (CYANOCOBALAMIN ) 1000 MCG tablet Take 1,000 mcg by mouth daily.     No facility-administered medications prior to visit.    Review of Systems;  Patient denies headache, fevers, malaise, unintentional weight loss, skin rash, eye pain, sinus congestion and sinus pain, sore throat, dysphagia,  hemoptysis , cough, dyspnea, wheezing, chest pain, palpitations, orthopnea, edema, abdominal pain, nausea, melena, diarrhea, constipation, flank pain, dysuria,  hematuria, urinary  Frequency, nocturia, numbness, tingling, seizures,  Focal weakness, Loss of consciousness,  Tremor, insomnia, depression, anxiety, and suicidal ideation.      Objective:  BP (!) 126/58   Pulse 84   Ht 5' 3 (1.6 m)   Wt 183 lb 3.2 oz (83.1 kg)   SpO2 96%   BMI 32.45 kg/m   BP Readings from Last 3 Encounters:  05/05/23 (!) 126/58  03/31/23 122/62  03/02/23 122/70    Wt Readings from Last 3 Encounters:  05/05/23 183 lb 3.2 oz (83.1 kg)  03/02/23 181 lb 6.4 oz (82.3 kg)  01/16/23 181 lb 3.2 oz (82.2 kg)    Physical Exam Vitals reviewed.  Constitutional:      General: She is not in acute distress.    Appearance: Normal  appearance. She is normal weight. She is not ill-appearing, toxic-appearing or diaphoretic.  HENT:     Head: Normocephalic.  Eyes:     General: No scleral icterus.       Right eye: No discharge.        Left eye: No discharge.     Conjunctiva/sclera: Conjunctivae normal.  Cardiovascular:     Rate and Rhythm: Normal rate and regular rhythm.     Heart sounds: Normal heart sounds.  Pulmonary:     Effort: Pulmonary effort is normal.     Breath sounds: Wheezing present.  Chest:     Chest wall: No tenderness.  Musculoskeletal:        General: Normal range of motion.  Skin:    General: Skin is warm and dry.  Neurological:     General: No focal deficit present.     Mental Status: She is alert and oriented to person, place, and time. Mental status is at baseline.  Psychiatric:        Mood and Affect: Mood normal.        Behavior: Behavior normal.        Thought Content: Thought content normal.        Judgment: Judgment normal.     Lab Results  Component Value Date   HGBA1C 5.9 11/21/2022   HGBA1C 6.3 (H) 08/15/2022   HGBA1C 6.1 09/15/2021    Lab Results  Component Value Date   CREATININE 0.80 03/31/2023   CREATININE 0.74 11/25/2022   CREATININE 0.77 11/21/2022    Lab Results  Component Value Date   WBC 5.1 03/31/2023   HGB 10.4 (L) 03/31/2023   HCT 31.4 (L) 03/31/2023   PLT 128 (L) 03/31/2023   GLUCOSE 103 (H) 03/31/2023   CHOL 152 11/21/2022   TRIG 58.0 11/21/2022   HDL 72.40 11/21/2022   LDLDIRECT 58.0 11/21/2022   LDLCALC 68 11/21/2022   ALT 42 03/31/2023   AST 20 03/31/2023   NA 140 03/31/2023   K 4.1 03/31/2023   CL 106 03/31/2023   CREATININE 0.80 03/31/2023   BUN 15 03/31/2023   CO2 25 03/31/2023   TSH 0.82 11/21/2022   INR 1.1 08/16/2022   HGBA1C 5.9 11/21/2022   MICROALBUR <0.7 11/21/2022    DG Cervical Spine Complete Result Date: 01/30/2023 CLINICAL DATA:  Neck pain for 2-3 months. EXAM: CERVICAL SPINE - COMPLETE 5 VIEW COMPARISON:  08/23/2022.  FINDINGS: Status post ACDF C3-C7. No evidence of hardware failure. Normal alignment. Unremarkable prevertebral and cervicocranial soft tissues. No acute osseous abnormalities. No motion with flexion and extension to suggest instability. IMPRESSION: C3-7 ACDF.  No acute osseous abnormalities. Electronically Signed   By: Fonda  Pleasure M.D.   On: 01/30/2023 09:10    Assessment & Plan:  .Dysuria -     POCT urinalysis dipstick  Coronary artery disease due to calcified coronary lesion  Mild persistent asthma with acute exacerbation Assessment & Plan: Prednisone  taper , azithromycin  500 mg daily x7 days.  Advised to use her albuterol  nebs as needed.  Samples of Trilegy for 30 days given .    History of cystitis Assessment & Plan: Treated recently for UTI secondary to E Col sensitive to cephalosporins with resolution of symptoms.     Other orders -     Albuterol  Sulfate HFA; Inhale 1-2 puffs into the lungs every 6 (six) hours as needed for wheezing or shortness of breath.  Dispense: 8.5 g; Refill: 11 -     Azithromycin ; Take 1 tablet (500 mg total) by mouth daily.  Dispense: 7 tablet; Refill: 0 -     predniSONE ; 6 tablets on Day 1 , then reduce by 1 tablet daily until gone  Dispense: 21 tablet; Refill: 0     I provided 36 minutes of face-to-face time during this encounter reviewing patient's last visit with me, patient's  most recent visit with pulmonology, cardiology, previous  labs and imaging studies, counseling on currently addressed issues,  and post visit ordering to diagnostics and therapeutics .   Follow-up: No follow-ups on file.   Verneita LITTIE Kettering, MD

## 2023-05-07 DIAGNOSIS — Z87440 Personal history of urinary (tract) infections: Secondary | ICD-10-CM | POA: Insufficient documentation

## 2023-05-07 NOTE — Assessment & Plan Note (Signed)
 Treated recently for UTI secondary to E Col sensitive to cephalosporins with resolution of symptoms.

## 2023-05-07 NOTE — Assessment & Plan Note (Signed)
 Prednisone taper , azithromycin 500 mg daily x7 days.  Advised to use her albuterol nebs as needed.  Samples of Trilegy for 30 days given .

## 2023-05-10 DIAGNOSIS — M4322 Fusion of spine, cervical region: Secondary | ICD-10-CM | POA: Diagnosis not present

## 2023-05-10 DIAGNOSIS — M5412 Radiculopathy, cervical region: Secondary | ICD-10-CM | POA: Diagnosis not present

## 2023-05-10 DIAGNOSIS — M542 Cervicalgia: Secondary | ICD-10-CM | POA: Diagnosis not present

## 2023-05-10 DIAGNOSIS — M5451 Vertebrogenic low back pain: Secondary | ICD-10-CM | POA: Diagnosis not present

## 2023-05-15 ENCOUNTER — Ambulatory Visit (INDEPENDENT_AMBULATORY_CARE_PROVIDER_SITE_OTHER): Payer: Medicare Other | Admitting: *Deleted

## 2023-05-15 VITALS — Ht 63.0 in | Wt 180.0 lb

## 2023-05-15 DIAGNOSIS — Z0000 Encounter for general adult medical examination without abnormal findings: Secondary | ICD-10-CM

## 2023-05-15 NOTE — Patient Instructions (Signed)
Sarah [name] , Thank you for taking time to come for your Medicare Wellness Visit. I appreciate your ongoing commitment to your health goals. Please review the following plan we discussed and let me know if I can assist you in the future.   Referrals/Orders/Follow-Ups/Clinician Recommendations: None  This is a list of the screening recommended for you and due dates:  Health Maintenance  Topic Date Due   COVID-19 Vaccine (7 - 2024-25 season) 05/21/2023*   Mammogram  07/11/2023   Colon Cancer Screening  03/24/2024   Medicare Annual Wellness Visit  05/14/2024   DTaP/Tdap/Td vaccine (3 - Td or Tdap) 05/26/2032   Pneumonia Vaccine  Completed   Flu Shot  Completed   DEXA scan (bone density measurement)  Completed   Hepatitis C Screening  Completed   Zoster (Shingles) Vaccine  Completed   HPV Vaccine  Aged Out  *Topic was postponed. The date shown is not the original due date.    Advanced directives: (Declined) Advance directive discussed with you today. Even though you declined this today, please call our office should you change your mind, and we can give you the proper paperwork for you to fill out.  Next Medicare Annual Wellness Visit scheduled for next year: Yes 05/15/24 @ 1:40

## 2023-05-15 NOTE — Progress Notes (Signed)
Subjective:   Sarah [name] is a 73 y.o. female who presents for Medicare Annual (Subsequent) preventive examination.  Visit Complete: Virtual I connected with  [name] on 05/15/23 by a audio enabled telemedicine application and verified that I am speaking with the correct person using two identifiers.This patient declined Interactive audio and Acupuncturist. Therefore the visit was completed with audio only.   Patient Location: Home  Provider Location: Office/Clinic  I discussed the limitations of evaluation and management by telemedicine. The patient expressed understanding and agreed to proceed.  Vital Signs: Because this visit was a virtual/telehealth visit, some criteria may be missing or patient reported. Any vitals not documented were not able to be obtained and vitals that have been documented are patient reported. Cardiac Risk Factors include: advanced age (>110men, >9 women);dyslipidemia;hypertension;obesity (BMI >30kg/m2)     Objective:    Today's Vitals   05/15/23 1337  Weight: 180 lb (81.6 kg)  Height: 5\' 3"  (1.6 m)   Body mass index is 31.89 kg/m.     05/15/2023    1:50 PM 03/31/2023   10:46 AM 11/25/2022    2:10 PM 08/15/2022   10:12 PM 08/15/2022    3:44 PM 08/11/2022   12:33 PM 07/26/2022    2:48 PM  Advanced Directives  Does Patient Have a Medical Advance Directive? No No No No No No Yes  Type of Advance Directive       Healthcare Power of Attorney  Does patient want to make changes to medical advance directive?   No - Patient declined No - Patient declined  No - Patient declined No - Patient declined  Copy of Healthcare Power of Attorney in Chart?       No - copy requested  Would patient like information on creating a medical advance directive? No - Patient declined   No - Patient declined  No - Patient declined     Current Medications (verified) Outpatient Encounter Medications as of 05/15/2023  Medication Sig   albuterol (VENTOLIN HFA)  108 (90 Base) MCG/ACT inhaler Inhale 1-2 puffs into the lungs every 4 (four) hours as needed for wheezing or shortness of breath.   albuterol (VENTOLIN HFA) 108 (90 Base) MCG/ACT inhaler Inhale 1-2 puffs into the lungs every 6 (six) hours as needed for wheezing or shortness of breath.   amLODipine (NORVASC) 5 MG tablet TAKE 1 TABLET BY MOUTH DAILY   Calcium Citrate-Vitamin D (CALCIUM CITRATE + D PO) Take 1 tablet by mouth 2 (two) times daily.   citalopram (CELEXA) 20 MG tablet TAKE 1 TABLET BY MOUTH DAILY   famotidine (PEPCID) 20 MG tablet TAKE 1 TABLET BY MOUTH DAILY. BEFORE DINNER   ferrous sulfate 324 MG TBEC Take 2 tablets by mouth. 65 mg of elemental iron   fexofenadine (ALLEGRA) 180 MG tablet Take 180 mg by mouth daily.   Fluticasone-Umeclidin-Vilant (TRELEGY ELLIPTA) 200-62.5-25 MCG/ACT AEPB Inhale 1 puff into the lungs daily.   Fluticasone-Umeclidin-Vilant (TRELEGY ELLIPTA) 200-62.5-25 MCG/ACT AEPB Inhale 1 puff into the lungs daily.   gabapentin (NEURONTIN) 100 MG capsule TAKE 1 CAPSULE BY MOUTH 3 TIMES  DAILY   levothyroxine (SYNTHROID) 50 MCG tablet TAKE 1 TABLET BY MOUTH DAILY ON  EMPTY STOMACH WITH A GLASS OF  WATER AT LEAST 30 TO 60 MINUTES  BEFORE BREAKFAST   pantoprazole (PROTONIX) 40 MG tablet TAKE 1 TABLET BY MOUTH DAILY   rosuvastatin (CRESTOR) 10 MG tablet TAKE ONE TABLET BY MOUTH EVERY DAY   solifenacin (VESICARE)  5 MG tablet TAKE ONE TABLET BY MOUTH EVERY DAY   traZODone (DESYREL) 50 MG tablet TAKE 1/2 TO 1 TABLET BY MOUTH AT BEDTIME AS NEEDED FOR SLEEP   vitamin B-12 (CYANOCOBALAMIN) 1000 MCG tablet Take 1,000 mcg by mouth daily.   [DISCONTINUED] azithromycin (ZITHROMAX) 500 MG tablet Take 1 tablet (500 mg total) by mouth daily. (Patient not taking: Reported on 05/15/2023)   [DISCONTINUED] predniSONE (DELTASONE) 10 MG tablet 6 tablets on Day 1 , then reduce by 1 tablet daily until gone (Patient not taking: Reported on 05/15/2023)   No facility-administered encounter  medications on file as of 05/15/2023.    Allergies (verified) Patient has no known allergies.   History: Past Medical History:  Diagnosis Date   Arthritis    Asthma    CAP (community acquired pneumonia) 08/11/2022   Depression    Dyspnea    with heat   Environmental and seasonal allergies    GERD (gastroesophageal reflux disease)    Hyperlipidemia    Hypertension    Hypothyroidism    Presence of dental prosthetic device    Dental implants   Wheezing    Past Surgical History:  Procedure Laterality Date   ABDOMINAL HYSTERECTOMY  04/26/1987   ANTERIOR CERVICAL DECOMPRESSION/DISCECTOMY FUSION 4 LEVELS N/A 11/10/2021   Procedure: C3-7 ANTERIOR CERVICAL DISCECTOMY AND FUSION (GLOBUS FORGE);  Surgeon: Venetia Night, MD;  Location: [facility];  Service: Neurosurgery;  Laterality: N/A;   BROW LIFT Bilateral 02/07/2020   Procedure: BLEPHAROPLASTY UPPER EYELID; W/EXCESS SKIN BROW PTOSIS REPAIR BILATERAL;  Surgeon: Imagene Riches, MD;  Location: [facility];  Service: Ophthalmology;  Laterality: Bilateral;   CATARACT EXTRACTION W/PHACO Right 09/13/2016   Procedure: CATARACT EXTRACTION PHACO AND INTRAOCULAR LENS PLACEMENT (IOC);  Surgeon: Galen Manila, MD;  Location: [facility];  Service: Ophthalmology;  Laterality: Right;  [place] 00:38 AP% 14.3 CDE 5.51 Fluid pack lot # [id number]   CATARACT EXTRACTION W/PHACO Left 10/11/2016   Procedure: CATARACT EXTRACTION PHACO AND INTRAOCULAR LENS PLACEMENT (IOC);  Surgeon: Galen Manila, MD;  Location: [facility];  Service: Ophthalmology;  Laterality: Left;  [place] 00:30 AP% 11.3 CDE 3.50 fluid pack lot # [id number]   COLONOSCOPY WITH PROPOFOL N/A 09/30/2014   Procedure: COLONOSCOPY WITH PROPOFOL;  Surgeon: Earline Mayotte, MD;  Location: [facility];  Service: Endoscopy;  Laterality: N/A;   COLONOSCOPY WITH PROPOFOL N/A 03/24/2021   Procedure: COLONOSCOPY WITH PROPOFOL;  Surgeon: Toney Reil, MD;  Location: [facility];   Service: Gastroenterology;  Laterality: N/A;   ESOPHAGOGASTRODUODENOSCOPY N/A 09/30/2014   Procedure: ESOPHAGOGASTRODUODENOSCOPY (EGD);  Surgeon: Earline Mayotte, MD;  Location: [facility];  Service: Endoscopy;  Laterality: N/A;   ESOPHAGOGASTRODUODENOSCOPY N/A 03/24/2021   Procedure: ESOPHAGOGASTRODUODENOSCOPY (EGD);  Surgeon: Toney Reil, MD;  Location: [facility];  Service: Gastroenterology;  Laterality: N/A;   FOOT SURGERY Bilateral 04/26/1983   KNEE SURGERY  04/25/2014   Family History  Problem Relation Age of Onset   Diabetes Mother    Hyperlipidemia Mother    Heart disease Mother    Prostate cancer Neg Hx    Kidney cancer Neg Hx    Bladder Cancer Neg Hx    Social History   Socioeconomic History   Marital status: Single    Spouse name: Not on file   Number of children: Not on file   Years of education: Not on file   Highest education level: Not on file  Occupational History   Not on file  Tobacco Use   Smoking  status: Never   Smokeless tobacco: Never  Vaping Use   Vaping status: Never Used  Substance and Sexual Activity   Alcohol use: No   Drug use: No   Sexual activity: Never  Other Topics Concern   Not on file  Social History Narrative   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 No smoking; no alcohol. Lives in [place], [place] lives self/kitty.    Social Drivers of Corporate investment banker Strain: Low Risk  (05/15/2023)   Overall Financial Resource Strain (CARDIA)    Difficulty of Paying Living Expenses: Not hard at all  Food Insecurity: No Food Insecurity (05/15/2023)   Hunger Vital Sign    Worried About Running Out of Food in the Last Year: Never true    Ran Out of Food in the Last Year: Never true  Transportation Needs: No Transportation Needs (05/15/2023)   PRAPARE - Administrator, Civil Service (Medical): No    Lack of Transportation (Non-Medical): No  Physical Activity: Inactive (05/15/2023)   Exercise Vital Sign     Days of Exercise per Week: 0 days    Minutes of Exercise per Session: 0 min  Stress: No Stress Concern Present (05/15/2023)   [employer] of Occupational Health - Occupational Stress Questionnaire    Feeling of Stress : Not at all  Social Connections: Moderately Integrated (05/15/2023)   Social Connection and Isolation Panel [NHANES]    Frequency of Communication with Friends and Family: More than three times a week    Frequency of Social Gatherings with Friends and Family: More than three times a week    Attends Religious Services: More than 4 times per year    Active Member of [employer] or Organizations: Yes    Attends Engineer, structural: More than 4 times per year    Marital Status: Never married    Tobacco Counseling Counseling given: Not Answered   Clinical Intake:  Pre-visit preparation completed: Yes  Pain : No/denies pain     BMI - recorded: 31.89 Nutritional Status: BMI > 30  Obese Nutritional Risks: None Diabetes: No  How often do you need to have someone help you when you read instructions, pamphlets, or other written materials from your doctor or pharmacy?: 1 - Never  Interpreter Needed?: No  Information entered by :: R. Patrese Neal LPN   Activities of Daily Living    05/15/2023    1:38 PM 08/15/2022   10:12 PM  In your present state of health, do you have any difficulty performing the following activities:  Hearing? 0 0  Vision? 0 0  Comment glasses   Difficulty concentrating or making decisions? 0 0  Walking or climbing stairs? 0 0  Dressing or bathing? 0 0  Doing errands, shopping? 0 0  Preparing Food and eating ? N   Using the Toilet? N   In the past six months, have you accidently leaked urine? Y   Do you have problems with loss of bowel control? N   Managing your Medications? N   Managing your Finances? N   Housekeeping or managing your Housekeeping? N     Patient Care Team: Sherlene Shams, MD as PCP - General (Internal  Medicine) Debbe Odea, MD as PCP - Cardiology (Cardiology) Sherlene Shams, MD (Internal Medicine) Lemar Livings, Merrily Pew, MD (General Surgery) Earna Coder, MD as Consulting Physician (Hematology and Oncology) Otho Ket, RN as [facility] Care Management  Indicate any recent Medical Services you  may have received from other than [facility] providers in the past year (date may be approximate).     Assessment:   This is a routine wellness examination for Sarah [name].  Hearing/Vision screen Hearing Screening - Comments:: No issues Vision Screening - Comments:: glasses   Goals Addressed             This Visit's Progress    Patient Stated       Wants to watch what she eats and get more exercise       Depression Screen    05/15/2023    1:43 PM 05/05/2023    9:09 AM 12/01/2022    4:04 PM 11/21/2022    1:43 PM 08/22/2022    1:41 PM 08/15/2022    2:37 PM 05/18/2022    1:26 PM  PHQ 2/9 Scores  PHQ - 2 Score 0 0 0 0 2 6 1   PHQ- 9 Score 7    5 16      Fall Risk    05/15/2023    1:40 PM 05/05/2023    9:09 AM 12/01/2022    4:04 PM 12/01/2022    4:03 PM 11/21/2022    1:43 PM  Fall Risk   Falls in the past year? 0 0 0  0  Number falls in past yr: 0 0 0 0 0  Injury with Fall? 0 0 0 0 0  Risk for fall due to : No Fall Risks No Fall Risks No Fall Risks No Fall Risks No Fall Risks  Follow up Falls prevention discussed;Falls evaluation completed Falls evaluation completed Falls evaluation completed Falls evaluation completed Falls evaluation completed    MEDICARE RISK AT HOME: Medicare Risk at Home Any stairs in or around the home?: Yes If so, are there any without handrails?: No Home free of loose throw rugs in walkways, pet beds, electrical cords, etc?: Yes Adequate lighting in your home to reduce risk of falls?: Yes Life alert?: Yes Use of a cane, walker or w/c?: No Grab bars in the bathroom?: Yes Shower chair or bench in shower?: Yes Elevated toilet seat or a  handicapped toilet?: Yes   Cognitive Function:    04/09/2018    9:59 AM 09/01/2014    1:36 PM  MMSE - Mini Mental State Exam  Orientation to time 4 5  Orientation to time comments Difficulty drawing clock face with time.    Orientation to Place 5 5  Registration 3 3  Attention/ Calculation 2 5  Attention/Calculation-comments Difficulty with calculation   Recall 1 3  Recall-comments 1 out of 3 recall   Language- name 2 objects 2 2  Language- repeat 1 1  Language- follow 3 step command 3 3  Language- read & follow direction 1 1  Write a sentence 0 1  Write a sentence-comments Difficulty completing sentence.    Copy design 1 1  Total score 23 30        05/15/2023    1:50 PM 05/12/2022    2:42 PM 04/14/2021   10:38 AM 04/13/2020   10:54 AM 04/11/2019   11:21 AM  6CIT Screen  What Year? 0 points 0 points 0 points 0 points 0 points  What month? 0 points 0 points 0 points 0 points 0 points  What time? 0 points 0 points 0 points 0 points 0 points  Count back from 20 0 points 0 points 0 points 0 points 0 points  Months in reverse 2 points 0 points 0 points 0 points 0  points  Repeat phrase 0 points 2 points 0 points 4 points 0 points  Total Score 2 points 2 points 0 points 4 points 0 points    Immunizations Immunization History  Administered Date(s) Administered   Fluad Quad(high Dose 65+) 12/06/2018   Fluad Trivalent(High Dose 65+) 01/16/2023   Influenza, High Dose Seasonal PF 04/22/2016, 06/05/2017   Influenza,inj,Quad PF,6+ Mos 04/26/2013, 01/30/2015   Influenza-Unspecified 01/24/2018, 12/06/2018, 01/02/2020, 04/02/2021, 02/03/2022   Moderna Covid-19 Fall Seasonal Vaccine 39yrs & older 02/10/2023   Moderna Covid-19 Vaccine Bivalent Booster 39yrs & up 02/05/2021, 02/03/2022   Moderna Sars-Covid-2 Vaccination 06/20/2019, 07/22/2019, 02/14/2020   PNEUMOCOCCAL CONJUGATE-20 11/09/2020   Pneumococcal Conjugate-13 09/01/2014, 01/24/2018   Pneumococcal Polysaccharide-23  04/26/2013, 02/28/2019   Rsv, Bivalent, Protein Subunit Rsvpref,pf Verdis Frederickson) 05/26/2022   Tdap 06/18/2014, 05/26/2022   Zoster Recombinant(Shingrix) 07/08/2017, 09/22/2017    TDAP status: Up to date  Flu Vaccine status: Up to date  Pneumococcal vaccine status: Up to date  Covid-19 vaccine status: Completed vaccines  Qualifies for Shingles Vaccine? Yes   Zostavax completed No   Shingrix Completed?: Yes  Screening Tests Health Maintenance  Topic Date Due   Medicare Annual Wellness (AWV)  05/13/2023   COVID-19 Vaccine (7 - 2024-25 season) 05/21/2023 (Originally 04/07/2023)   MAMMOGRAM  07/11/2023   Colonoscopy  03/24/2024   DTaP/Tdap/Td (3 - Td or Tdap) 05/26/2032   Pneumonia Vaccine 39+ Years old  Completed   INFLUENZA VACCINE  Completed   DEXA SCAN  Completed   Hepatitis C Screening  Completed   Zoster Vaccines- Shingrix  Completed   HPV VACCINES  Aged Out    Health Maintenance  Health Maintenance Due  Topic Date Due   Medicare Annual Wellness (AWV)  05/13/2023    Colorectal cancer screening: Type of screening: Colonoscopy. Completed 02/2021. Repeat every 3 years  Mammogram status: Completed 06/2022. Repeat every year  Bone Density status: Completed 09/2014. Results reflect: Bone density results: OSTEOPENIA. Repeat every 2 years. Wants to discuss with PCP at next visit  Lung Cancer Screening: (Low Dose CT Chest recommended if Age 35-80 years, 20 pack-year currently smoking OR have quit w/in 15years.) does not qualify.     Additional Screening:  Hepatitis C Screening: does qualify; Completed 01/2021  Vision Screening: Recommended annual ophthalmology exams for early detection of glaucoma and other disorders of the eye. Is the patient up to date with their annual eye exam?  Yes  Who is the provider or what is the name of the office in which the patient attends annual eye exams? [facility] If pt is not established with a provider, would they like to be referred  to a provider to establish care? No .   Dental Screening: Recommended annual dental exams for proper oral hygiene    Community Resource Referral / Chronic Care Management: CRR required this visit?  No   CCM required this visit?  No     Plan:     I have personally reviewed and noted the following in the patient's chart:   Medical and social history Use of alcohol, tobacco or illicit drugs  Current medications and supplements including opioid prescriptions. Patient is not currently taking opioid prescriptions. Functional ability and status Nutritional status Physical activity Advanced directives List of other physicians Hospitalizations, surgeries, and ER visits in previous 12 months Vitals Screenings to include cognitive, depression, and falls Referrals and appointments  In addition, I have reviewed and discussed with patient certain preventive protocols, quality metrics, and best practice recommendations.  A written personalized care plan for preventive services as well as general preventive health recommendations were provided to patient.     Sydell Axon, LPN   10/22/5282   After Visit Summary: (Pick Up) Due to this being a telephonic visit, with patients personalized plan was offered to patient and patient has requested to Pick up at office.  Nurse Notes: None

## 2023-05-16 ENCOUNTER — Ambulatory Visit: Payer: Medicare Other | Admitting: Pulmonary Disease

## 2023-05-24 ENCOUNTER — Ambulatory Visit: Payer: Medicare Other | Admitting: Internal Medicine

## 2023-05-29 ENCOUNTER — Ambulatory Visit (INDEPENDENT_AMBULATORY_CARE_PROVIDER_SITE_OTHER): Payer: Medicare Other | Admitting: Pulmonary Disease

## 2023-05-29 ENCOUNTER — Encounter: Payer: Self-pay | Admitting: Pulmonary Disease

## 2023-05-29 VITALS — BP 120/60 | HR 71 | Temp 98.1°F | Ht 63.0 in | Wt 184.0 lb

## 2023-05-29 DIAGNOSIS — J4540 Moderate persistent asthma, uncomplicated: Secondary | ICD-10-CM

## 2023-05-29 DIAGNOSIS — D46Z Other myelodysplastic syndromes: Secondary | ICD-10-CM | POA: Diagnosis not present

## 2023-05-29 DIAGNOSIS — G4733 Obstructive sleep apnea (adult) (pediatric): Secondary | ICD-10-CM | POA: Diagnosis not present

## 2023-05-29 MED ORDER — TRELEGY ELLIPTA 200-62.5-25 MCG/ACT IN AEPB: 1.0000 | INHALATION_SPRAY | Freq: Every day | RESPIRATORY_TRACT | 0 refills | Status: DC

## 2023-05-29 NOTE — Progress Notes (Signed)
Subjective:    Patient ID: Sarah [name], female    DOB: 10/26/1950, 73 y.o.   MRN: [id number]  Patient Care Team: Sherlene Shams, MD as PCP - General (Internal Medicine) Debbe Odea, MD as PCP - Cardiology (Cardiology) Sherlene Shams, MD (Internal Medicine) Lemar Livings Merrily Pew, MD (General Surgery) Earna Coder, MD as Consulting Physician (Hematology and Oncology) Otho Ket, RN as [facility] Care Management  Chief Complaint  Patient presents with   Asthma    Trelegy cost $300+    BACKGROUND/INTERVAL:Patient is a 73 year old lifelong never smoker with a history of moderate persistent asthma who presents today as a scheduled visit.  I last saw the patient on 16 January 2023.  She has been maintained on Trelegy Ellipta 200, 1 inhalation daily for her asthma.  She is also on rescue albuterol.  She is on PAP for obstructive sleep apnea.  AutoSet 5 to 15 cm H2O. this is a scheduled visit.  HPI Discussed the use of AI scribe software for clinical note transcription with the patient, who gave verbal consent to proceed.  History of Present Illness   The patient, with obstructive sleep apnea and asthma, presents with issues related to CPAP machine use and asthma management.  She is experiencing difficulties with her CPAP machine, specifically with the nasal mask frequently dislodging during the night, which disrupts her sleep. She often finds the mask on the floor or not on her face upon waking. She uses a nasal mask and cleans it with warm water, though not regularly. She inquires about additional filters for the machine, although she changes the existing ones periodically. She is concerned about maintaining the required usage of the CPAP machine to avoid having it taken away.  She faces challenges in obtaining her asthma medication, Trelegy, due to its cost, which was $302 at her last purchase. She prefers this medication, stating it has been effective  for her. Despite this, she continues to have a persistent deep cough, similar to when she was previously hospitalized, and notes that it has never completely resolved.  The cough is for the most part nonproductive occasionally productive of whitish sputum.  Though still present, it is not very frequent.  She has an element of asthmatic bronchitis in addition to her asthma, which contributes to her respiratory symptoms.   Her CPAP compliance shows usage over 30 days of 93%, usage over 4 hours of 60% and remainder is less than 4 hours per night use.  Her residual AHI is 3.3.  She averages pressures of 10 to 12 cm H2O on a given night.  She does have myelodysplastic syndrome with significant anemia and need for transfusions due to the same.  Review of Systems A 10 point review of systems was performed and it is as noted above otherwise negative.   Patient Active Problem List   Diagnosis Date Noted   History of cystitis 05/07/2023   Coronary artery disease due to calcified coronary lesion 05/05/2023   Persistent asthma with acute exacerbation 05/05/2023   Bilateral lower extremity edema 12/02/2022   OSA (obstructive sleep apnea) 09/06/2022   Hyperglycemia 08/15/2022   Snoring 06/03/2022   Hypersomnia 05/18/2022   Suprapubic cramping 05/18/2022   Overactive bladder 05/18/2022   MDS (myelodysplastic syndrome), low grade (HCC) 04/20/2022   Tubular adenoma of colon 02/07/2022   Cervical myelopathy (HCC) 11/10/2021   Spinal stenosis, cervical region 09/15/2021   Adenomatous polyp of transverse colon    Polyp of  colon    Thrombocytopenia (HCC) 01/13/2021   Essential hypertension 12/20/2020   Macrocytic anemia 12/18/2020   Allergic rhinitis 12/13/2020   Fatigue 12/13/2020   Insomnia 12/13/2020   Grief reaction 08/09/2020   Cognitive complaints 08/09/2020   Neuropathy 06/14/2020   Educated about COVID-19 virus infection 04/10/2019   Prediabetes 10/13/2018   LVH (left ventricular  hypertrophy) due to hypertensive disease, without heart failure 10/13/2018   Hospital discharge follow-up 07/08/2018   Hypothyroid 06/15/2017   Post herpetic neuralgia 06/06/2017   Encounter for preventive health examination 09/09/2014   Chronic GERD 09/09/2014   Hyperlipidemia 08/17/2014   S/P hysterectomy 04/28/2013   Initial Medicare annual wellness visit 04/28/2013   Depression     Social History   Tobacco Use   Smoking status: Never   Smokeless tobacco: Never  Substance Use Topics   Alcohol use: No    No Known Allergies  Current Meds  Medication Sig   albuterol (VENTOLIN HFA) 108 (90 Base) MCG/ACT inhaler Inhale 1-2 puffs into the lungs every 6 (six) hours as needed for wheezing or shortness of breath.   amLODipine (NORVASC) 5 MG tablet TAKE 1 TABLET BY MOUTH DAILY   Calcium Citrate-Vitamin D (CALCIUM CITRATE + D PO) Take 1 tablet by mouth 2 (two) times daily.   citalopram (CELEXA) 20 MG tablet TAKE 1 TABLET BY MOUTH DAILY   famotidine (PEPCID) 20 MG tablet TAKE 1 TABLET BY MOUTH DAILY. BEFORE DINNER   ferrous sulfate 324 MG TBEC Take 2 tablets by mouth. 65 mg of elemental iron   fexofenadine (ALLEGRA) 180 MG tablet Take 180 mg by mouth daily.   Fluticasone-Umeclidin-Vilant (TRELEGY ELLIPTA) 200-62.5-25 MCG/ACT AEPB Inhale 1 puff into the lungs daily.   Fluticasone-Umeclidin-Vilant (TRELEGY ELLIPTA) 200-62.5-25 MCG/ACT AEPB Inhale 1 puff into the lungs daily.   gabapentin (NEURONTIN) 100 MG capsule TAKE 1 CAPSULE BY MOUTH 3 TIMES  DAILY   levothyroxine (SYNTHROID) 50 MCG tablet TAKE 1 TABLET BY MOUTH DAILY ON  EMPTY STOMACH WITH A GLASS OF  WATER AT LEAST 30 TO 60 MINUTES  BEFORE BREAKFAST   pantoprazole (PROTONIX) 40 MG tablet TAKE 1 TABLET BY MOUTH DAILY   rosuvastatin (CRESTOR) 10 MG tablet TAKE ONE TABLET BY MOUTH EVERY DAY   solifenacin (VESICARE) 5 MG tablet TAKE ONE TABLET BY MOUTH EVERY DAY   traZODone (DESYREL) 50 MG tablet TAKE 1/2 TO 1 TABLET BY MOUTH AT BEDTIME  AS NEEDED FOR SLEEP   vitamin B-12 (CYANOCOBALAMIN) 1000 MCG tablet Take 1,000 mcg by mouth daily.    Immunization History  Administered Date(s) Administered   Fluad Quad(high Dose 65+) 12/06/2018   Fluad Trivalent(High Dose 65+) 01/16/2023   Influenza, High Dose Seasonal PF 04/22/2016, 06/05/2017   Influenza,inj,Quad PF,6+ Mos 04/26/2013, 01/30/2015   Influenza-Unspecified 01/24/2018, 12/06/2018, 01/02/2020, 04/02/2021, 02/03/2022   Moderna Covid-19 Fall Seasonal Vaccine 13yrs & older 02/10/2023   Moderna Covid-19 Vaccine Bivalent Booster 56yrs & up 02/05/2021, 02/03/2022   Moderna Sars-Covid-2 Vaccination 06/20/2019, 07/22/2019, 02/14/2020   PNEUMOCOCCAL CONJUGATE-20 11/09/2020   Pneumococcal Conjugate-13 09/01/2014, 01/24/2018   Pneumococcal Polysaccharide-23 04/26/2013, 02/28/2019   Rsv, Bivalent, Protein Subunit Rsvpref,pf Verdis Frederickson) 05/26/2022   Tdap 06/18/2014, 05/26/2022   Zoster Recombinant(Shingrix) 07/08/2017, 09/22/2017        Objective:     BP 120/60   Pulse 71   Temp 98.1 F (36.7 C)   Ht 5\' 3"  (1.6 m)   Wt 184 lb (83.5 kg)   SpO2 96%   BMI 32.59 kg/m   SpO2: 96 %  GENERAL: Overweight woman, no acute distress.  Fully ambulatory, no conversational dyspnea, no pallor noted today. HEAD: Normocephalic, atraumatic.  EYES: Pupils equal, round, reactive to light.  No scleral icterus.  Pale conjunctiva. MOUTH: Natural dentition present, few chipped teeth.  Oral mucosa moist. NECK: Supple. No thyromegaly. Trachea midline. No JVD.  No adenopathy. PULMONARY: Lungs sound coarse but otherwise clear to auscultation bilaterally. CARDIOVASCULAR: S1 and S2. Regular rate and rhythm.  No rubs, murmurs gallops heard. GASTROINTESTINAL: Benign. MUSCULOSKELETAL: No joint deformity, no clubbing, no edema.  NEUROLOGIC: No overt focal deficits. SKIN: Intact,warm,dry. PSYCH: Mood and behavior appropriate.:     Assessment & Plan:     ICD-10-CM   1. Moderate persistent  asthma without complication  J45.40     2. MDS (myelodysplastic syndrome), low grade (HCC)  D46.Z     3. OSA (obstructive sleep apnea)  G47.33       Meds ordered this encounter  Medications   Fluticasone-Umeclidin-Vilant (TRELEGY ELLIPTA) 200-62.5-25 MCG/ACT AEPB    Sig: Inhale 1 puff into the lungs daily.    Dispense:  28 each    Refill:  0    Lot Number?:   [id number]    Expiration Date?:   07/24/2024    Quantity:   2   Discussion:    Obstructive Sleep Apnea Suboptimal CPAP compliance at 60%, below the recommended 70%. Reports difficulty keeping the nasal mask on during the night, leading to poor sleep quality. Emphasized the importance of regular mask cleaning and periodic replacement of the nasal piece. Discussed the severity of apnea and potential consequences of non-compliance, including machine repossession. - Increase CPAP usage to at least 70% compliance - Clean mask with water and vinegar, instructions provided on AVS - Replace nasal piece periodically - Follow-up in three months to assess compliance and effectiveness  Moderate Persistent Asthma Persistent deep cough since last hospitalization. Currently using Trelegy, which is effective but financially burdensome. Provided samples to ensure continued use. - Provide Trelegy samples - Monitor asthma symptoms and adjust treatment as necessary - Follow-up in three months to reassess asthma control  Asthmatic Bronchitis Contributing to persistent cough and exacerbating asthma symptoms periodically. - Continue current asthma management plan - Monitor for exacerbations and adjust treatment as needed - Reassess during next follow-up visit  Myelodysplastic Syndrome with Anemia No recent need for transfusion, followed by hematology oncology. -This issue adds complexity to her management  Follow-up - Follow-up appointment in three months.      Advised if symptoms do not improve or worsen, to please contact office for sooner  follow up or seek emergency care.    I spent 30 minutes of dedicated to the care of this patient on the date of this encounter to include pre-visit review of records, face-to-face time with the patient discussing conditions above, post visit ordering of testing, clinical documentation with the electronic health record, making appropriate referrals as documented, and communicating necessary findings to members of the patients care team.     C. Danice Goltz, MD Advanced Bronchoscopy PCCM [facility]    *This note was generated using voice recognition software/Dragon and/or AI transcription program.  Despite best efforts to proofread, errors can occur which can change the meaning. Any transcriptional errors that result from this process are unintentional and may not be fully corrected at the time of dictation.

## 2023-05-29 NOTE — Patient Instructions (Addendum)
VISIT SUMMARY:  During today's visit, we discussed your challenges with using the CPAP machine for obstructive sleep apnea and managing your asthma. We reviewed your current treatment plans and made some adjustments to help improve your symptoms and overall health.  YOUR PLAN:  -OBSTRUCTIVE SLEEP APNEA: Obstructive sleep apnea is a condition where your breathing stops and starts repeatedly during sleep due to blocked airways. To improve your sleep quality and compliance with the CPAP machine, it is important to increase your usage to at least 70%. Please clean your nasal mask regularly with water and vinegar, and replace the nasal piece periodically. We will follow up in three months to check your progress.  -MODERATE PERSISTENT ASTHMA: Asthma is a condition that causes your airways to become inflamed and narrow, making it hard to breathe. You have a persistent deep cough and are using Trelegy, which is effective but expensive. We provided you with samples to ensure you can continue using it. Please monitor your asthma symptoms and we will reassess your asthma control in three months.  -ASTHMATIC BRONCHITIS: Asthmatic bronchitis is when your asthma symptoms are worsened by bronchitis, leading to a persistent cough. Continue with your current asthma management plan and monitor for any exacerbations. We will reassess this condition during your next follow-up visit.  INSTRUCTIONS:  Please schedule a follow-up appointment in three months to reassess your obstructive sleep apnea, asthma, and asthmatic bronchitis. Continue using the CPAP machine as instructed and monitor your asthma symptoms closely.   CPAP CLEANING Cleaning and Maintaining Your CPAP Equipment  Regular cleaning and maintenance of your CPAP (continuous positive airway pressure) equipment is essential for ensuring its effectiveness and preventing health risks. Here's a comprehensive guide to cleaning your CPAP:  Daily Cleaning: Empty  water tank: Empty the water tank daily and rinse it with warm water.  Clean mask: Wash your mask with warm, soapy water and air dry it.  Clean tubing: Rinse the tubing with warm water and hang it to dry.  Replace filter: Replace the disposable filter as recommended by the manufacturer.  Weekly Cleaning:  Disinfect mask: Soak your mask in a solution of 1 part white vinegar to 3 parts water for 30 minutes. Rinse thoroughly and air dry.  Clean humidifier: Remove the humidifier and wash it with warm, soapy water.  Check hose for damage: Inspect the tubing for any tears, cracks, or blockages.  Monthly Cleaning:  Clean water chamber: Remove the water chamber and soak it in a solution of 1 part white vinegar to 3 parts water for 30 minutes. Rinse thoroughly and air dry.  Lubricate moving parts: Apply a small amount of silicone lubricant to the moving parts of the machine.  Other Cleaning Tips:  Use a soft cloth to wipe down the exterior of the machine. Avoid using harsh chemicals or bleach. Never submerge the machine in water. Follow the manufacturer's instructions for cleaning and maintenance.  Additional Considerations:  If you have a respiratory infection, clean your equipment more frequently.  Replace your mask and tubing every 3-6 months, or as recommended by the manufacturer.  Store your equipment in a cool, dry place.  By following these cleaning instructions, you can help ensure that your CPAP equipment remains in optimal condition and provides you with effective treatment for sleep apnea.    I also recommend you watch videos from [employer] or the [employer] with regards to cleaning.

## 2023-06-28 ENCOUNTER — Other Ambulatory Visit: Payer: Self-pay | Admitting: Internal Medicine

## 2023-06-28 DIAGNOSIS — E785 Hyperlipidemia, unspecified: Secondary | ICD-10-CM

## 2023-07-21 IMAGING — MR MR ABDOMEN WO/W CM
17 series · 48 of 48 positions shown · IV contrast (Eovist)
Comparison: MRI abdomen 02/19/2021

CLINICAL DATA: Liver lesion follow-up

EXAM:
MRI ABDOMEN WITHOUT AND WITH CONTRAST
TECHNIQUE: Multiplanar multisequence MR imaging of the abdomen was performed
both before and after the administration of intravenous contrast.
CONTRAST:  8mL EOVIST GADOXETATE DISODIUM 0.25 MOL/L IV SOLN

[Series 3: T2 · coronal · 6.0mm · 1.19mm/px · 3 of 33 slices shown (1 of 2)]
[im 1/33]
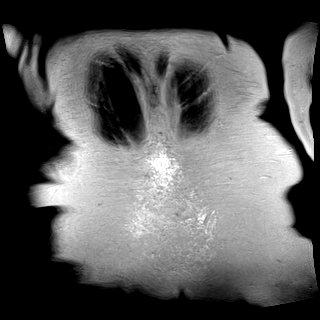
[im 17/33]
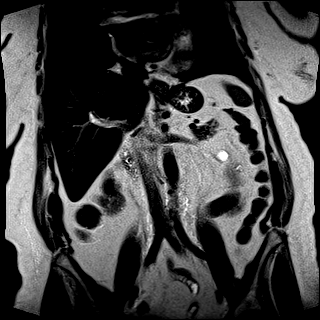
[im 33/33]
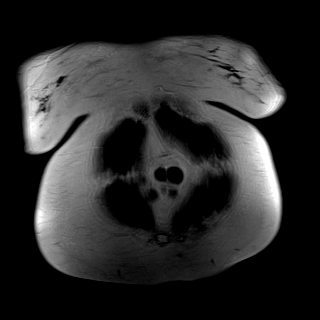

[Series 7: T1 dynamic fat-sat · axial · 3.0mm · 1.19mm/px · z∈[-103,+110]mm · 4 of 72 slices shown (1 of 9)]
[im 1/72]
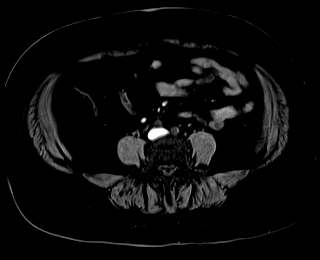
[im 24/72]
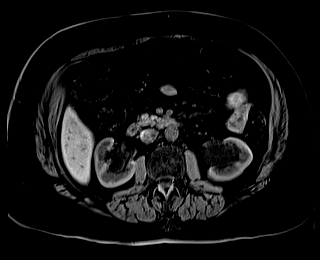
[im 48/72]
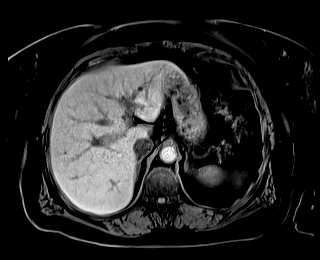
[im 72/72]
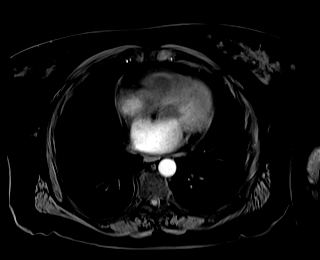

[Series 10: T1 dynamic fat-sat · axial · 3.0mm · 1.19mm/px · z∈[-103,+110]mm · 4 of 72 slices shown (2 of 9)]
[im 1/72]
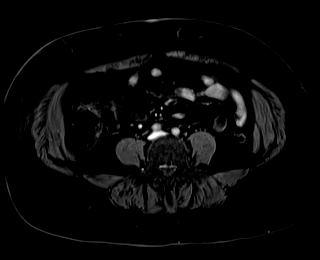
[im 24/72]
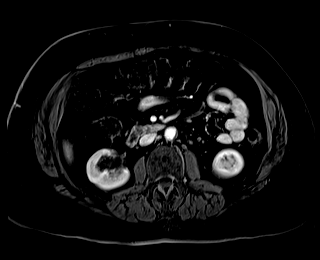
[im 48/72]
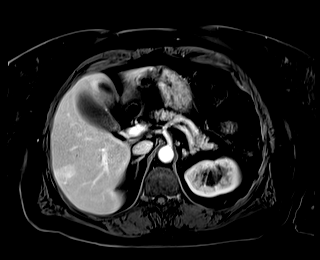
[im 72/72]
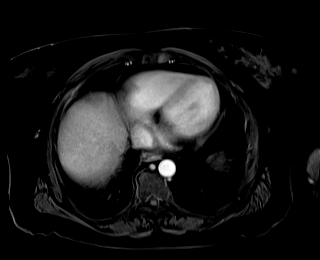

[Series 11: T1 dynamic fat-sat · axial · 3.0mm · 1.19mm/px · z∈[-103,+110]mm · 4 of 72 slices shown (3 of 9)]
[im 1/72]
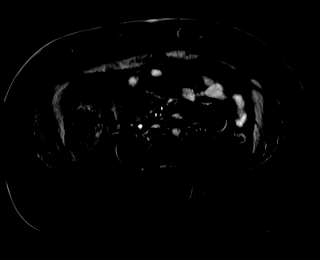
[im 24/72]
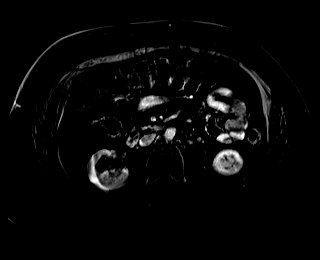
[im 48/72]
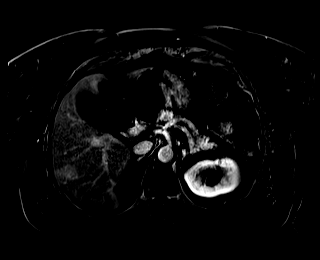
[im 72/72]
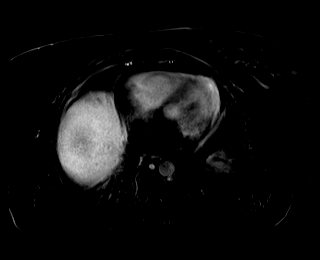

[Series 14: T1 dynamic fat-sat · axial · 3.0mm · 1.19mm/px · z∈[-103,+110]mm · 4 of 72 slices shown (4 of 9)]
[im 1/72]
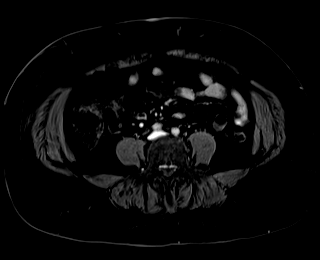
[im 24/72]
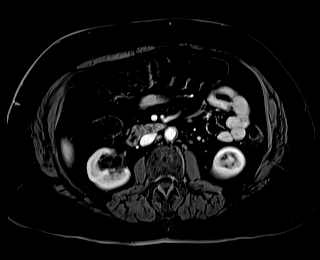
[im 48/72]
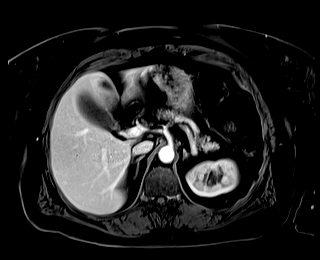
[im 72/72]
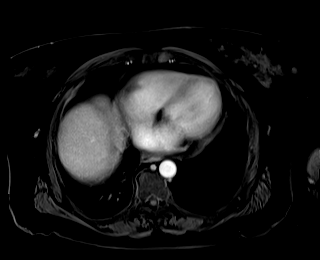

[Series 15: T1 dynamic fat-sat · axial · 3.0mm · 1.19mm/px · z∈[-103,+110]mm · 4 of 72 slices shown (5 of 9)]
[im 1/72]
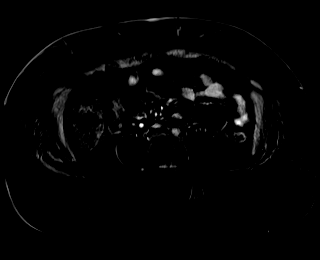
[im 24/72]
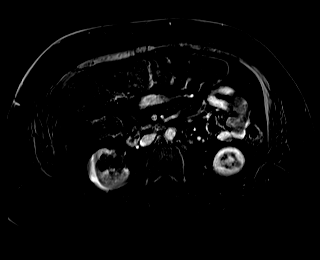
[im 48/72]
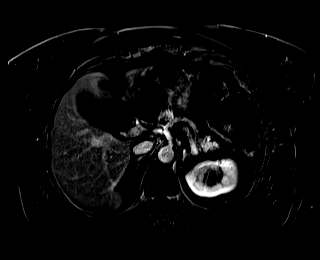
[im 72/72]
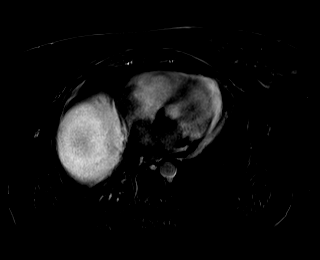

[Series 18: T1 dynamic fat-sat · axial · 3.0mm · 1.19mm/px · z∈[-103,+110]mm · 3 of 72 slices shown (6 of 9)]
[im 1/72]
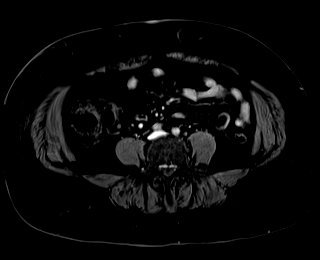
[im 36/72]
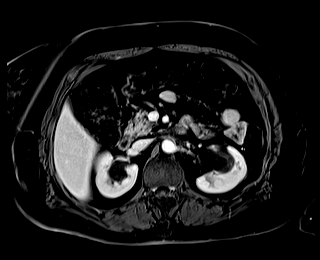
[im 72/72]
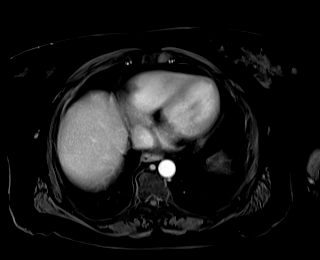

[Series 19: T1 dynamic fat-sat · axial · 3.0mm · 1.19mm/px · z∈[-103,+110]mm · 3 of 72 slices shown (7 of 9)]
[im 1/72]
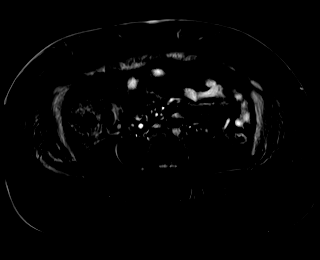
[im 36/72]
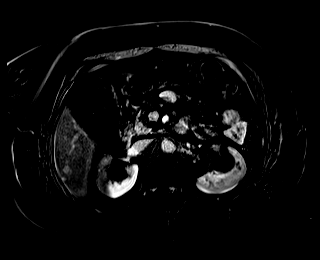
[im 72/72]
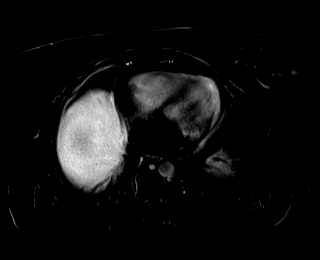

[Series 20: T1 dynamic post-contrast · coronal · 3.0mm · 1.31mm/px · 3 of 72 slices shown]
[im 1/72]
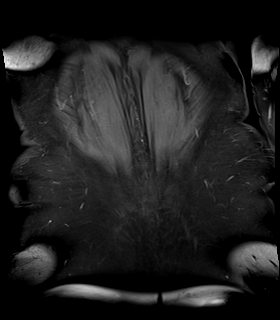
[im 36/72]
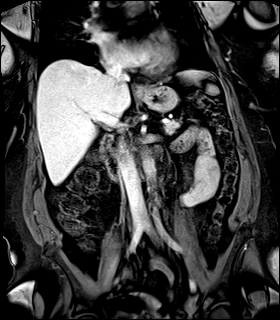
[im 72/72]
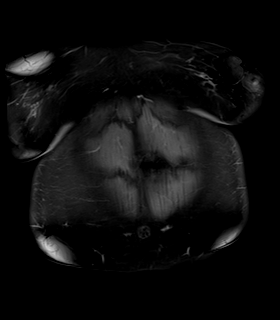

[Series 22: T2 fat-sat · axial · 6.0mm · 1.19mm/px · z∈[-94,+142]mm · 2 of 34 slices shown]
[im 1/34]
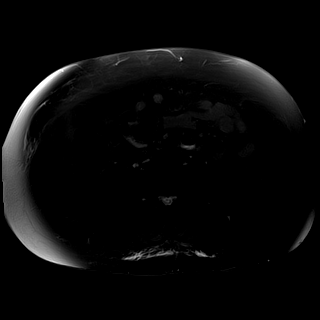
[im 34/34]
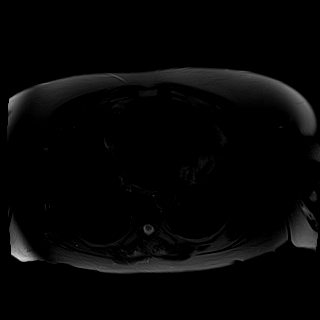

[Series 26: DWI · axial · 6.0mm · 1.42mm/px · z∈[-80,+128]mm · 4 of 90 slices shown]
[im 1/90]
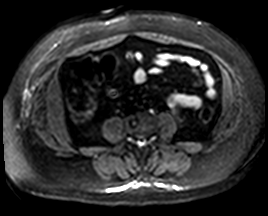
[im 30/90]
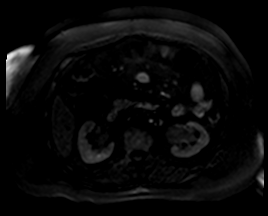
[im 60/90]
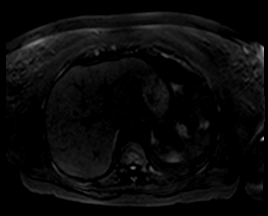
[im 90/90]
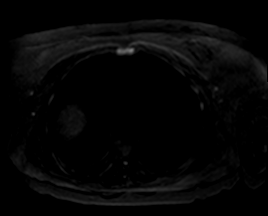

[Series 27: ax dwi_adc · axial · 6.0mm · 1.42mm/px · 1 of 30 slices shown]
[im 1/30]
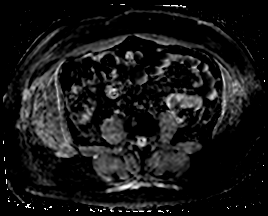

[Series 28: ax dwi_calc_bval · axial · 6.0mm · 1.42mm/px · 1 of 30 slices shown]
[im 1/30]
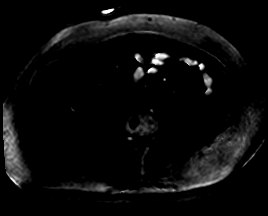

[Series 29: bSSFP · axial · 6.0mm · 0.74mm/px · 1 of 30 slices shown]
[im 1/30]
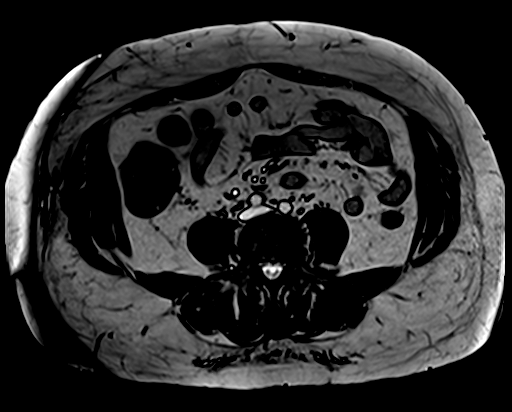

[Series 30: T2 · axial · 6.0mm · 1.19mm/px · 1 of 30 slices shown (2 of 2)]
[im 1/30]
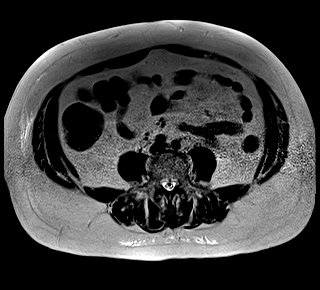

[Series 33: T1 dynamic fat-sat · axial · 3.0mm · 1.19mm/px · z∈[-103,+110]mm · 3 of 72 slices shown (8 of 9)]
[im 1/72]
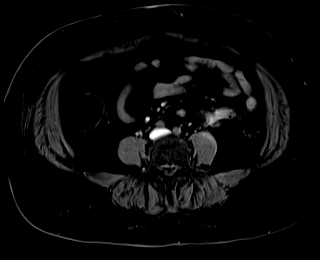
[im 36/72]
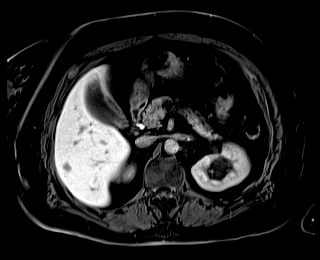
[im 72/72]
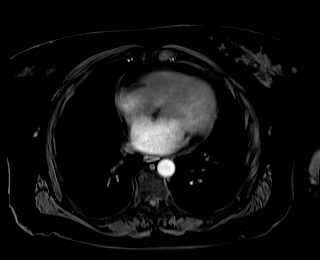

[Series 34: T1 dynamic fat-sat · axial · 3.0mm · 1.19mm/px · z∈[-103,+110]mm · 3 of 72 slices shown (9 of 9)]
[im 1/72]
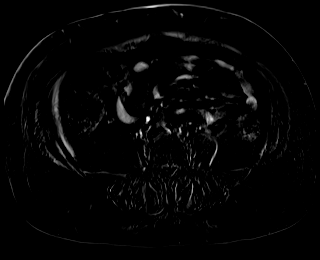
[im 36/72]
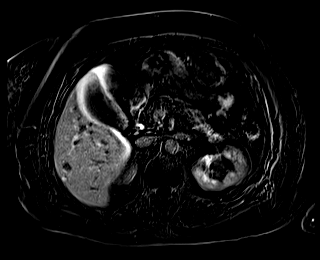
[im 72/72]
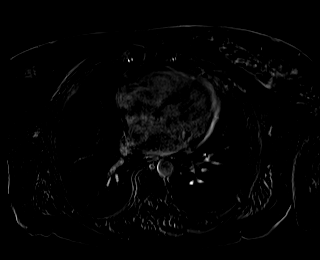

[48 of 48 positions shown; findings below may reference images not displayed]

FINDINGS: Lower chest: No acute findings.

Hepatobiliary: Liver is normal in size and contour stable size and
appearance of a mildly lobulated hyperintense T2 signal lesion in
segment [DATE] measuring 12 mm which demonstrates early arterial
enhancement which persists until the hepatobiliary phase, consistent
with hemangioma. Two small cysts again seen measuring up to 15 mm in
segment 2. Gallbladder appears normal. No biliary ductal dilatation.

Pancreas: No mass, inflammatory changes, or other parenchymal
abnormality identified.

Spleen:  Within normal limits in size and appearance.

Adrenals/Urinary Tract: Adrenal glands appear normal. Multiple
parapelvic cysts are again seen bilaterally. No suspicious enhancing
renal mass identified.

Stomach/Bowel: Tiny hiatal hernia. No evidence of bowel obstruction.

Vascular/Lymphatic: No pathologically enlarged lymph nodes
identified. No abdominal aortic aneurysm demonstrated.

Other:  No ascites

Musculoskeletal: No suspicious bone lesions identified.
IMPRESSION: 1. Stable small hepatic hemangioma and small hepatic cysts.
2. Parapelvic renal cysts.
3. Tiny hiatal hernia.

## 2023-07-22 ENCOUNTER — Other Ambulatory Visit: Payer: Self-pay | Admitting: Internal Medicine

## 2023-07-26 DIAGNOSIS — M3501 Sicca syndrome with keratoconjunctivitis: Secondary | ICD-10-CM | POA: Diagnosis not present

## 2023-07-26 DIAGNOSIS — Z961 Presence of intraocular lens: Secondary | ICD-10-CM | POA: Diagnosis not present

## 2023-07-26 DIAGNOSIS — H43813 Vitreous degeneration, bilateral: Secondary | ICD-10-CM | POA: Diagnosis not present

## 2023-07-26 DIAGNOSIS — H02889 Meibomian gland dysfunction of unspecified eye, unspecified eyelid: Secondary | ICD-10-CM | POA: Diagnosis not present

## 2023-07-28 ENCOUNTER — Telehealth: Payer: Self-pay | Admitting: Cardiology

## 2023-07-28 NOTE — Telephone Encounter (Signed)
 Spoke with [facility], advised to contact the pulmonary department regarding CPAP.

## 2023-07-28 NOTE — Telephone Encounter (Signed)
 [facility] calling in regards to pt requesting a CPAP machine in order to approve request thye would like our office to send over a request and any sleep study results. Please advise

## 2023-08-01 ENCOUNTER — Other Ambulatory Visit: Payer: Self-pay | Admitting: *Deleted

## 2023-08-01 ENCOUNTER — Other Ambulatory Visit: Payer: Self-pay

## 2023-08-01 DIAGNOSIS — D46Z Other myelodysplastic syndromes: Secondary | ICD-10-CM

## 2023-08-02 ENCOUNTER — Telehealth: Payer: Self-pay

## 2023-08-02 ENCOUNTER — Inpatient Hospital Stay (HOSPITAL_BASED_OUTPATIENT_CLINIC_OR_DEPARTMENT_OTHER): Payer: Medicare Other | Admitting: Internal Medicine

## 2023-08-02 ENCOUNTER — Inpatient Hospital Stay: Payer: Medicare Other | Attending: Nurse Practitioner

## 2023-08-02 DIAGNOSIS — Z833 Family history of diabetes mellitus: Secondary | ICD-10-CM | POA: Diagnosis not present

## 2023-08-02 DIAGNOSIS — Z83438 Family history of other disorder of lipoprotein metabolism and other lipidemia: Secondary | ICD-10-CM | POA: Insufficient documentation

## 2023-08-02 DIAGNOSIS — D46Z Other myelodysplastic syndromes: Secondary | ICD-10-CM

## 2023-08-02 DIAGNOSIS — G4733 Obstructive sleep apnea (adult) (pediatric): Secondary | ICD-10-CM

## 2023-08-02 DIAGNOSIS — D469 Myelodysplastic syndrome, unspecified: Secondary | ICD-10-CM | POA: Insufficient documentation

## 2023-08-02 DIAGNOSIS — J45909 Unspecified asthma, uncomplicated: Secondary | ICD-10-CM | POA: Insufficient documentation

## 2023-08-02 DIAGNOSIS — Z79899 Other long term (current) drug therapy: Secondary | ICD-10-CM | POA: Insufficient documentation

## 2023-08-02 DIAGNOSIS — Z79890 Hormone replacement therapy (postmenopausal): Secondary | ICD-10-CM | POA: Diagnosis not present

## 2023-08-02 DIAGNOSIS — E039 Hypothyroidism, unspecified: Secondary | ICD-10-CM | POA: Diagnosis not present

## 2023-08-02 DIAGNOSIS — I10 Essential (primary) hypertension: Secondary | ICD-10-CM | POA: Insufficient documentation

## 2023-08-02 DIAGNOSIS — Z90710 Acquired absence of both cervix and uterus: Secondary | ICD-10-CM | POA: Insufficient documentation

## 2023-08-02 DIAGNOSIS — M1711 Unilateral primary osteoarthritis, right knee: Secondary | ICD-10-CM | POA: Insufficient documentation

## 2023-08-02 DIAGNOSIS — E785 Hyperlipidemia, unspecified: Secondary | ICD-10-CM | POA: Diagnosis not present

## 2023-08-02 DIAGNOSIS — Z8249 Family history of ischemic heart disease and other diseases of the circulatory system: Secondary | ICD-10-CM | POA: Diagnosis not present

## 2023-08-02 LAB — CBC WITH DIFFERENTIAL (CANCER CENTER ONLY)
Abs Immature Granulocytes: 0.1 10*3/uL — ABNORMAL HIGH (ref 0.00–0.07)
Basophils Absolute: 0.1 10*3/uL (ref 0.0–0.1)
Basophils Relative: 3 %
Eosinophils Absolute: 0.2 10*3/uL (ref 0.0–0.5)
Eosinophils Relative: 4 %
HCT: 31.2 % — ABNORMAL LOW (ref 36.0–46.0)
Hemoglobin: 10.6 g/dL — ABNORMAL LOW (ref 12.0–15.0)
Immature Granulocytes: 2 %
Lymphocytes Relative: 23 %
Lymphs Abs: 1 10*3/uL (ref 0.7–4.0)
MCH: 39.1 pg — ABNORMAL HIGH (ref 26.0–34.0)
MCHC: 34 g/dL (ref 30.0–36.0)
MCV: 115.1 fL — ABNORMAL HIGH (ref 80.0–100.0)
Monocytes Absolute: 0.4 10*3/uL (ref 0.1–1.0)
Monocytes Relative: 8 %
Neutro Abs: 2.7 10*3/uL (ref 1.7–7.7)
Neutrophils Relative %: 60 %
Platelet Count: 129 10*3/uL — ABNORMAL LOW (ref 150–400)
RBC: 2.71 MIL/uL — ABNORMAL LOW (ref 3.87–5.11)
RDW: 14 % (ref 11.5–15.5)
Smear Review: DECREASED
WBC Count: 4.5 10*3/uL (ref 4.0–10.5)
nRBC: 0 % (ref 0.0–0.2)

## 2023-08-02 LAB — IRON AND TIBC
Iron: 93 ug/dL (ref 28–170)
Saturation Ratios: 26 % (ref 10.4–31.8)
TIBC: 354 ug/dL (ref 250–450)
UIBC: 261 ug/dL

## 2023-08-02 LAB — VITAMIN B12: Vitamin B-12: 7134 pg/mL — ABNORMAL HIGH (ref 180–914)

## 2023-08-02 LAB — CMP (CANCER CENTER ONLY)
ALT: 16 U/L (ref 0–44)
AST: 15 U/L (ref 15–41)
Albumin: 4.1 g/dL (ref 3.5–5.0)
Alkaline Phosphatase: 66 U/L (ref 38–126)
Anion gap: 8 (ref 5–15)
BUN: 16 mg/dL (ref 8–23)
CO2: 27 mmol/L (ref 22–32)
Calcium: 9.3 mg/dL (ref 8.9–10.3)
Chloride: 104 mmol/L (ref 98–111)
Creatinine: 0.78 mg/dL (ref 0.44–1.00)
GFR, Estimated: >60 mL/min (ref >60)
Glucose, Bld: 126 mg/dL — ABNORMAL HIGH (ref 70–99)
Potassium: 3.9 mmol/L (ref 3.5–5.1)
Sodium: 139 mmol/L (ref 135–145)
Total Bilirubin: 0.6 mg/dL (ref 0.0–1.2)
Total Protein: 6.9 g/dL (ref 6.5–8.1)

## 2023-08-02 LAB — FERRITIN: Ferritin: 32 ng/mL (ref 11–307)

## 2023-08-02 NOTE — Assessment & Plan Note (Addendum)
#[  FEB 2023] Mild macrocytic anemia/thrombocytopenia-hemoglobin around 11-12; platelets 127-140s.  Normal white count. December 2023-bone marrow biopsy -Hypercellular bone marrow for age with dyspoietic changes; no blasts noted. cytogenetics/FISH positive for 5 q. Deletion.  Currently on surveillance. If patient continues to drop/gets more symptomatic recommend Revlimid 10 mg 3 on and 1 week off.As per patient is not significant symptomatic. Given her preference,  I think is reasonable to continue surveillance every 3-4 months.   # APRIL 2025-hemoglobin 10.5 ; platelets 128; white count is normal- stable.  Continue oral iron/patient preference.     For now continue surveillance. Patient will call us if more symptomatic.   # Nonobstructive CAD/ Recent [place] CTA showed mild nonobstructive CAD [[facility]]- ok with asprin. However, discussed that bruising might get worse.   # Hx of B12 deficiency- on B12 supplementation-2024 Elevated B12- recommend B12 every other day.   # Fatigue: sec to social issues- unlikely secondary to MDS- monitor for now.   # DISPOSITION:  # Follow up in  4  MONTHS- - MD;labs- cbc/cmp;iron studies; ferritin; LDH;  b12;-  Dr.B

## 2023-08-02 NOTE — Progress Notes (Signed)
 [facility] CONSULT NOTE  Patient Care Team: Sherlene Shams, MD as PCP - General (Internal Medicine) Debbe Odea, MD as PCP - Cardiology (Cardiology) Sherlene Shams, MD (Internal Medicine) Lemar Livings Merrily Pew, MD (General Surgery) Earna Coder, MD as Consulting Physician (Hematology and Oncology) Otho Ket, RN as [facility] Care Management  CHIEF COMPLAINTS/PURPOSE OF CONSULTATION: MDS  HEMATOLOGY HISTORY: Oncology History Overview Note  DEC 2023- DIAGNOSIS: LOW GRADE MDS- 5 Q DEL;   # MACROCYTIC ANEMIA [since 2022- April hb 11; MCV-103; platelet- 120s] EGD/ colonoscopy >>> 10 years ago; OCT 2022-slight improvement blood counts/stable; hepatitis HIV negative.  Myeloma panel negative.   BONE MARROW, ASPIRATE, CLOT, CORE: -Hypercellular bone marrow for age with dyspoietic changes -See comment  PERIPHERAL BLOOD: -Macrocytic anemia -Thrombocytopenia  COMMENT:  The overall findings are very concerning for involvement by a low-grade myelodysplastic syndrome.  The differential diagnosis includes changes related to nutritional deficiency, medication, immune mediated process, infection, etc. Correlation with cytogenetic and FISH studies is recommended.  MICROSCOPIC DESCRIPTION:  PERIPHERAL BLOOD SMEAR: The red blood cells display mild anisopoikilocytosis with mild polychromasia.  The white blood cells are normal in number with occasional hypogranular and/or hypolobated neutrophils.  Scattered reactive appearing lymphocytes are present.  The platelets are decreased in number.  BONE MARROW ASPIRATE: Bone marrow particles present. Erythroid precursors: Progressive maturation with occasional late precursors displaying nuclear cytoplasmic dyssynchrony. Granulocytic precursors: Progressive maturation with many mature neutrophils displaying hypogranulation and/or hypolobation.  No increase in blastic cells identified. Megakaryocytes:  Abundant with many small and/or hypolobated/unilobated forms Lymphocytes/plasma cells: Large aggregates not present  TOUCH PREPARATIONS: A mixture of cell types present.  CLOT AND BIOPSY: The sections show variable cellularity ranging from 10% focally to 70% with a mixture of cell types.  Expansile sheets of blastic cells are not identified.  A small interstitial lymphoid aggregate composed of small lymphoid cells is seen.  IRON STAIN: Iron stains are performed on a bone marrow aspirate or touch imprint smear and section of clot. The controls stained appropriately.       Storage Iron:  Abundant      Ring Sideroblasts: Absent  ADDITIONAL DATA/TESTING: The specimen was sent for cytogenetic analysis and FISH for MDS and a separate report will follow. Flow cytometric analysis (WLS [id number]) was performed and shows T cells with nonspecific changes.  No monoclonal B-cell population or significant CD34 positive blastic population identified.     #Incidental right hepatic lobe 13 mm hypoechoic lesion [ultrasound-October 2022]-MRI OCT 2022-   Low clinical concern for metastatic disease; however need to rule out primary HCC versus others.  1. There are 3 small liver lesions, 2 of which represent simple cysts. 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 MAY 2023-abdominal MRI -benign cyst no further surveillance recommended.   #October 2022-ultrasound incidental bilateral mild to moderate hydronephrosis.-Rs/p evaluation with Dr.Brandon urology.   MDS (myelodysplastic syndrome), low grade (HCC)  04/20/2022 Initial Diagnosis   MDS (myelodysplastic syndrome), low grade (HCC)    HISTORY OF PRESENTING ILLNESS: Ambulating independently.  With friend.   [name] 73 y.o.  female is here for follow-up today re: low grade MDS with 5 q del is here for a follow up.  Pt feels sleepy and tired. Denies dyspnea or  lightheadedness. Appetite is good. Right knee arthritis.    Denies any blood in stools or black-colored stools.  Patient denies any nausea vomiting. Chronic  mild easy bruising.  No tremors.  Review of Systems  Constitutional:  Positive for malaise/fatigue. Negative for chills, diaphoresis, fever and weight loss.  HENT:  Negative for nosebleeds and sore throat.   Eyes:  Negative for double vision.  Respiratory:  Negative for cough, hemoptysis, sputum production, shortness of breath and wheezing.   Cardiovascular:  Negative for chest pain, palpitations, orthopnea and leg swelling.  Gastrointestinal:  Negative for abdominal pain, blood in stool, constipation, diarrhea, heartburn, melena, nausea and vomiting.  Genitourinary:  Negative for dysuria, frequency and urgency.  Musculoskeletal:  Positive for joint pain. Negative for back pain.  Skin: Negative.  Negative for itching and rash.  Neurological:  Negative for dizziness, tingling, focal weakness, weakness and headaches.  Endo/Heme/Allergies:  Bruises/bleeds easily.  Psychiatric/Behavioral:  Negative for depression. The patient is not nervous/anxious and does not have insomnia.     MEDICAL HISTORY:  Past Medical History:  Diagnosis Date   Arthritis    Asthma    CAP (community acquired pneumonia) 08/11/2022   Depression    Dyspnea    with heat   Environmental and seasonal allergies    GERD (gastroesophageal reflux disease)    Hyperlipidemia    Hypertension    Hypothyroidism    Presence of dental prosthetic device    Dental implants   Wheezing     SURGICAL HISTORY: Past Surgical History:  Procedure Laterality Date   ABDOMINAL HYSTERECTOMY  04/26/1987   ANTERIOR CERVICAL DECOMPRESSION/DISCECTOMY FUSION 4 LEVELS N/A 11/10/2021   Procedure: C3-7 ANTERIOR CERVICAL DISCECTOMY AND FUSION (GLOBUS FORGE);  Surgeon: Venetia Night, MD;  Location: [facility] ORS;  Service: Neurosurgery;  Laterality: N/A;   BROW LIFT Bilateral 02/07/2020    Procedure: BLEPHAROPLASTY UPPER EYELID; W/EXCESS SKIN BROW PTOSIS REPAIR BILATERAL;  Surgeon: Imagene Riches, MD;  Location: [facility];  Service: Ophthalmology;  Laterality: Bilateral;   CATARACT EXTRACTION W/PHACO Right 09/13/2016   Procedure: CATARACT EXTRACTION PHACO AND INTRAOCULAR LENS PLACEMENT (IOC);  Surgeon: Galen Manila, MD;  Location: [facility] ORS;  Service: Ophthalmology;  Laterality: Right;  [place] 00:38 AP% 14.3 CDE 5.51 Fluid pack lot # [id number]   CATARACT EXTRACTION W/PHACO Left 10/11/2016   Procedure: CATARACT EXTRACTION PHACO AND INTRAOCULAR LENS PLACEMENT (IOC);  Surgeon: Galen Manila, MD;  Location: [facility] OR[facility];  Service: Ophthalmology;  Laterality: Left;  [place] 00:30 AP% 11.3 CDE 3.50 fluid pack lot # [id number]   COLONOSCOPY WITH PROPOFOL N/A 09/30/2014   Procedure: COLONOSCOPY WITH PROPOFOL;  Surgeon: Earline Mayotte, MD;  Location: [facility];  Service: Endoscopy;  Laterality: N/A;   COLONOSCOPY WITH PROPOFOL N/A 03/24/2021   Procedure: COLONOSCOPY WITH PROPOFOL;  Surgeon: Toney Reil, MD;  Location: [facility] ENDOSCOPY;  Service: Gastroenterology;  Laterality: N/A;   ESOPHAGOGASTRODUODENOSCOPY N/A 09/30/2014   Procedure: ESOPHAGOGASTRODUODENOSCOPY (EGD);  Surgeon: Earline Mayotte, MD;  Location: [facility] END[facility];  Service: Endoscopy;  Laterality: N/A;   ESOPHAGOGASTRODUODENOSCOPY N/A 03/24/2021   Procedure: ESOPHAGOGASTRODUODENOSCOPY (EGD);  Surgeon: Toney Reil, MD;  Location: [facility] ENDOSCOPY;  Service: Gastroenterology;  Laterality: N/A;   FOOT SURGERY Bilateral 04/26/1983   KNEE SURGERY  04/25/2014    SOCIAL HISTORY: Social History   Socioeconomic History   Marital status: Single    Spouse name: Not on file   Number of children: Not on file   Years of education: Not on file   Highest education level: Not on file  Occupational History   Not on file  Tobacco Use   Smoking status: Never   Smokeless tobacco: Never  Vaping Use   Vaping  status: Never Used  Substance and Sexual Activity   Alcohol use: No   Drug use: No   Sexual activity: Never  Other Topics Concern   Not on file  Social History Narrative   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 No smoking; no alcohol. Lives in [place], [place] lives self/kitty.    Social Drivers of Corporate investment banker Strain: Low Risk  (05/15/2023)   Overall Financial Resource Strain (CARDIA)    Difficulty of Paying Living Expenses: Not hard at all  Food Insecurity: No Food Insecurity (05/15/2023)   Hunger Vital Sign    Worried About Running Out of Food in the Last Year: Never true    Ran Out of Food in the Last Year: Never true  Transportation Needs: No Transportation Needs (05/15/2023)   PRAPARE - Administrator, Civil Service (Medical): No    Lack of Transportation (Non-Medical): No  Physical Activity: Inactive (05/15/2023)   Exercise Vital Sign    Days of Exercise per Week: 0 days    Minutes of Exercise per Session: 0 min  Stress: No Stress Concern Present (05/15/2023)   [employer] of Occupational Health - Occupational Stress Questionnaire    Feeling of Stress : Not at all  Social Connections: Moderately Integrated (05/15/2023)   Social Connection and Isolation Panel [NHANES]    Frequency of Communication with Friends and Family: More than three times a week    Frequency of Social Gatherings with Friends and Family: More than three times a week    Attends Religious Services: More than 4 times per year    Active Member of [employer] or Organizations: Yes    Attends Engineer, structural: More than 4 times per year    Marital Status: Never married  Intimate Partner Violence: Not At Risk (05/15/2023)   Humiliation, Afraid, Rape, and Kick questionnaire    Fear of Current or Ex-Partner: No    Emotionally Abused: No    Physically Abused: No    Sexually Abused: No    FAMILY HISTORY: Family History  Problem Relation Age of Onset   Diabetes Mother     Hyperlipidemia Mother    Heart disease Mother    Prostate cancer Neg Hx    Kidney cancer Neg Hx    Bladder Cancer Neg Hx     ALLERGIES:  has no known allergies.  MEDICATIONS:  Current Outpatient Medications  Medication Sig Dispense Refill   albuterol (VENTOLIN HFA) 108 (90 Base) MCG/ACT inhaler Inhale 1-2 puffs into the lungs every 6 (six) hours as needed for wheezing or shortness of breath. 8.5 g 11   amLODipine (NORVASC) 5 MG tablet TAKE 1 TABLET BY MOUTH DAILY 100 tablet 2   Calcium Citrate-Vitamin D (CALCIUM CITRATE + D PO) Take 1 tablet by mouth 2 (two) times daily.     citalopram (CELEXA) 20 MG tablet TAKE 1 TABLET BY MOUTH DAILY 100 tablet 3   famotidine (PEPCID) 20 MG tablet TAKE 1 TABLET BY MOUTH DAILY. BEFORE DINNER 30 tablet 11   ferrous sulfate 324 MG TBEC Take 2 tablets by mouth. 65 mg of elemental iron     fexofenadine (ALLEGRA) 180 MG tablet Take 180 mg by mouth daily.     Fluticasone-Umeclidin-Vilant (TRELEGY ELLIPTA) 200-62.5-25 MCG/ACT AEPB Inhale 1 puff into the lungs daily. 60 each 6   Fluticasone-Umeclidin-Vilant (TRELEGY ELLIPTA) 200-62.5-25 MCG/ACT AEPB Inhale 1 puff into the lungs daily. 28 each 0   gabapentin (  NEURONTIN) 100 MG capsule TAKE 1 CAPSULE BY MOUTH 3 TIMES  DAILY 300 capsule 2   levothyroxine (SYNTHROID) 50 MCG tablet TAKE 1 TABLET BY MOUTH DAILY ON  EMPTY STOMACH WITH A GLASS OF  WATER AT LEAST 30 TO 60 MINUTES  BEFORE BREAKFAST 100 tablet 2   pantoprazole (PROTONIX) 40 MG tablet TAKE 1 TABLET BY MOUTH DAILY 100 tablet 2   rosuvastatin (CRESTOR) 10 MG tablet TAKE ONE TABLET BY MOUTH ONCE DAILY 100 tablet 1   solifenacin (VESICARE) 5 MG tablet TAKE ONE TABLET BY MOUTH EVERY DAY 30 tablet 2   traZODone (DESYREL) 50 MG tablet TAKE 1/2 TO 1 TABLET BY MOUTH AT BEDTIME AS NEEDED FOR SLEEP 90 tablet 3   vitamin B-12 (CYANOCOBALAMIN) 1000 MCG tablet Take 1,000 mcg by mouth daily.     No current facility-administered medications for this visit.       PHYSICAL EXAMINATION:   Vitals:   08/02/23 1326  BP: (!) 121/59  Pulse: 73  Resp: 16  Temp: (!) 97 F (36.1 C)  SpO2: 97%     Filed Weights   08/02/23 1326  Weight: 184 lb 3.2 oz (83.6 kg)      Physical Exam Vitals and nursing note reviewed.  HENT:     Head: Normocephalic and atraumatic.     Mouth/Throat:     Pharynx: Oropharynx is clear.  Eyes:     Extraocular Movements: Extraocular movements intact.     Pupils: Pupils are equal, round, and reactive to light.  Cardiovascular:     Rate and Rhythm: Normal rate and regular rhythm.  Pulmonary:     Comments: Decreased breath sounds bilaterally.  Abdominal:     Palpations: Abdomen is soft.  Musculoskeletal:        General: Normal range of motion.     Cervical back: Normal range of motion.  Skin:    General: Skin is warm.  Neurological:     General: No focal deficit present.     Mental Status: She is alert and oriented to person, place, and time.  Psychiatric:        Behavior: Behavior normal.        Judgment: Judgment normal.     LABORATORY DATA:  I have reviewed the data as listed Lab Results  Component Value Date   WBC 4.5 08/02/2023   HGB 10.6 (L) 08/02/2023   HCT 31.2 (L) 08/02/2023   MCV 115.1 (H) 08/02/2023   PLT 129 (L) 08/02/2023   Recent Labs    08/15/22 2146 08/16/22 0644 11/25/22 1411 03/31/23 1031 08/02/23 1309  NA  --    < > 137 140 139  K  --    < > 3.7 4.1 3.9  CL  --    < > 105 106 104  CO2  --    < > 24 25 27   GLUCOSE  --    < > 151* 103* 126*  BUN  --    < > 18 15 16   CREATININE 0.85   < > 0.74 0.80 0.78  CALCIUM  --    < > 9.1 9.3 9.3  GFRNONAA >60   < > >60 >60 >60  PROT 7.1   < > 6.8 6.9 6.9  ALBUMIN 3.7   < > 4.0 3.9 4.1  AST 29   < > 16 20 15   ALT 35   < > 15 42 16  ALKPHOS 69   < > 52 92 66  BILITOT  0.5   < > 0.5 1.0 0.6  BILIDIR <0.1  --   --   --   --   IBILI NOT CALCULATED  --   --   --   --    < > = values in this interval not displayed.     No  results found.  MDS (myelodysplastic syndrome), low grade (HCC) #[FEB 2023] Mild macrocytic anemia/thrombocytopenia-hemoglobin around 11-12; platelets 127-140s.  Normal white count. December 2023-bone marrow biopsy -Hypercellular bone marrow for age with dyspoietic changes; no blasts noted. cytogenetics/FISH positive for 5 q. Deletion.  Currently on surveillance. If patient continues to drop/gets more symptomatic recommend Revlimid 10 mg 3 on and 1 week off.As per patient is not significant symptomatic. Given her preference,  I think is reasonable to continue surveillance every 3-4 months.   # APRIL 2025-hemoglobin 10.5 ; platelets 128; white count is normal- stable.  Continue oral iron/patient preference.     For now continue surveillance. Patient will call us if more symptomatic.   # Nonobstructive CAD/ Recent Cardia CTA showed mild nonobstructive CAD [[facility]]- ok with asprin. However, discussed that bruising might get worse.   # Hx of B12 deficiency- on B12 supplementation-2024 Elevated B12- recommend B12 every other day.   # Fatigue: sec to social issues- unlikely secondary to MDS- monitor for now.   # DISPOSITION:  # Follow up in  4  MONTHS- - MD;labs- cbc/cmp;iron studies; ferritin; LDH;  b12;-  Dr.B    All questions were answered. The patient knows to call the clinic with any problems, questions or concerns.    Earna Coder, MD 08/02/2023 2:27 PM

## 2023-08-02 NOTE — Progress Notes (Signed)
 Pt feels sleepy and tired. Denies dyspnea or lightheadedness. Appetite is good. Right knee arthritis.

## 2023-08-02 NOTE — Telephone Encounter (Addendum)
 Patient came in the office today. She said the top of her mask where there is a gasket is broke and she needs a new mask. She said she reached out to [employer] and they said they would reach out to our office. I told her we have not received anything. She is wanting to know if we can send over an order for a new mask and check her compliance report to make sure it shows she is using the machine.

## 2023-08-02 NOTE — Telephone Encounter (Signed)
 Okay to send new mask she was wearing a DreamWear nasal mask.  The compliance report shows she is using the device though some breaks may have occurred due to mask being nonfunctional.

## 2023-08-03 NOTE — Telephone Encounter (Signed)
 I have notified the patient and placed the order for the mask.  Nothing further needed.

## 2023-08-12 ENCOUNTER — Other Ambulatory Visit: Payer: Self-pay | Admitting: Internal Medicine

## 2023-08-12 DIAGNOSIS — I10 Essential (primary) hypertension: Secondary | ICD-10-CM

## 2023-08-31 ENCOUNTER — Other Ambulatory Visit: Payer: Self-pay | Admitting: Internal Medicine

## 2023-08-31 ENCOUNTER — Encounter: Payer: Self-pay | Admitting: Pulmonary Disease

## 2023-08-31 ENCOUNTER — Ambulatory Visit: Payer: Medicare Other | Admitting: Pulmonary Disease

## 2023-08-31 VITALS — BP 122/70 | HR 67 | Temp 97.6°F | Ht 63.0 in

## 2023-08-31 DIAGNOSIS — D46Z Other myelodysplastic syndromes: Secondary | ICD-10-CM

## 2023-08-31 DIAGNOSIS — R053 Chronic cough: Secondary | ICD-10-CM | POA: Diagnosis not present

## 2023-08-31 DIAGNOSIS — J479 Bronchiectasis, uncomplicated: Secondary | ICD-10-CM

## 2023-08-31 DIAGNOSIS — G4733 Obstructive sleep apnea (adult) (pediatric): Secondary | ICD-10-CM

## 2023-08-31 DIAGNOSIS — D539 Nutritional anemia, unspecified: Secondary | ICD-10-CM

## 2023-08-31 DIAGNOSIS — J4489 Other specified chronic obstructive pulmonary disease: Secondary | ICD-10-CM | POA: Diagnosis not present

## 2023-08-31 DIAGNOSIS — J4540 Moderate persistent asthma, uncomplicated: Secondary | ICD-10-CM

## 2023-08-31 MED ORDER — AZITHROMYCIN 250 MG PO TABS: 250.0000 mg | ORAL_TABLET | ORAL | 1 refills | Status: DC

## 2023-08-31 NOTE — Patient Instructions (Signed)
 VISIT SUMMARY:  During your visit, we discussed your persistent cough and shortness of breath, which have been troubling you since your recent hospitalization. We reviewed your asthma management, your issues with sleep apnea treatment, and your myelodysplastic syndrome with anemia. We also addressed your chronic cough related to bronchiectasis.  YOUR PLAN:  -ASTHMA WITH CHRONIC ASTHMATIC BRONCHITIS: Your asthma, which includes chronic asthmatic bronchitis, is well controlled with your current medication, Trelegy. You should continue using Trelegy as it has been effective for you.  -BRONCHIECTASIS WITH CHRONIC COUGH: Bronchiectasis is a condition where the airways in your lungs are damaged, leading to a persistent cough. To help manage this, you will start taking azithromycin  on Mondays, Wednesdays, and Fridays. You have been prescribed 12 tablets with one refill.  -OBSTRUCTIVE SLEEP APNEA: Obstructive sleep apnea is a condition where your breathing stops and starts during sleep. You have been having trouble using your CPAP machine due to missing tubing. Please contact your supplier to get the necessary tubing and try using the new Airfit nasal mask once you have it.  -MYELODYSPLASTIC SYNDROME WITH ANEMIA: Myelodysplastic syndrome is a disorder caused by poorly formed or dysfunctional blood cells, leading to anemia. Your hemoglobin levels have been improving, which is a positive sign. This condition contributes to your shortness of breath.  INSTRUCTIONS:  Please schedule a follow-up appointment in three months.

## 2023-08-31 NOTE — Progress Notes (Unsigned)
 Subjective:    Patient ID: Sarah [name], female    DOB: 26-Feb-1951, 73 y.o.   MRN: [id number]  Patient Care Team: Thersia Flax, MD as PCP - General (Internal Medicine) Constancia Delton, MD as PCP - Cardiology (Cardiology) Thersia Flax, MD (Internal Medicine) Marquita Situ Magali Schmitz, MD (General Surgery) Gwyn Leos, MD as Consulting Physician (Hematology and Oncology) Green, Davina E, RN as [facility] Care Management Homsher, Candice Chalet, RN as [facility] Care Management  Chief Complaint  Patient presents with   Follow-up    Cough. Shortness of breath on exertion. No wheezing. Not wearing CPAP. Has not received new mask.     BACKGROUND/INTERVAL:Patient is a 73 year old lifelong never smoker with a history of moderate persistent asthma who presents today as a scheduled visit.  I last saw the patient on .  She has been maintained on Trelegy Ellipta  200, 1 inhalation daily for her asthma.  She is also on rescue albuterol .  She is on CPAP for obstructive sleep apnea. I last saw her on 29 May 2023.   HPI Discussed the use of AI scribe software for clinical note transcription with the patient, who gave verbal consent to proceed.  History of Present Illness   Sarah [name] is a 73 year old female with myelodysplastic syndrome and asthma who presents with persistent cough and shortness of breath.  She experiences a persistent deep cough and shortness of breath, particularly noticeable during exertion, such as walking from her car to the clinic.  Quality of the cough has not changed.  These symptoms have not changed significantly from baseline. She attributes some of her shortness of breath to anemia related to her myelodysplastic syndrome.  She uses Trelegy for asthma management, which she finds effective, and albuterol  as needed. She has not tried Breztri . She has difficulty complying with her sleep apnea treatment due to missing tubing for her CPAP mask, an issue  persisting for over ten days. She is supposed to use the CPAP every night but has been unable to do so.  Her hemoglobin levels have been gradually increasing, with the most recent reading at 10.6 g/dL, up from 40.9 and 9.8 in previous tests. She has difficulty accessing her blood work results due to issues with printing them from her online chart.  She has a history of bronchiectasis, which may contribute to her deep cough. She is awaiting the arrival of a new Airfit nasal mask for her sleep apnea.   She does not endorse any fevers, chills or sweats.  No orthopnea or paroxysmal nocturnal dyspnea.    Review of Systems A 10 point review of systems was performed and it is as noted above otherwise negative.   Patient Active Problem List   Diagnosis Date Noted   Acute hypoxic respiratory failure (HCC) 09/04/2023   Right middle lobe pneumonia 09/04/2023   History of cystitis 05/07/2023   Coronary artery disease due to calcified coronary lesion 05/05/2023   Persistent asthma with acute exacerbation 05/05/2023   Bilateral lower extremity edema 12/02/2022   OSA (obstructive sleep apnea) 09/06/2022   Hyperglycemia 08/15/2022   Snoring 06/03/2022   Hypersomnia 05/18/2022   Suprapubic cramping 05/18/2022   Overactive bladder 05/18/2022   MDS (myelodysplastic syndrome), low grade (HCC) 04/20/2022   Tubular adenoma of colon 02/07/2022   Cervical myelopathy (HCC) 11/10/2021   Spinal stenosis, cervical region 09/15/2021   Adenomatous polyp of transverse colon    Polyp of colon    Thrombocytopenia (HCC) 01/13/2021  Essential hypertension 12/20/2020   Macrocytic anemia 12/18/2020   Allergic rhinitis 12/13/2020   Fatigue 12/13/2020   Insomnia 12/13/2020   Grief reaction 08/09/2020   Cognitive complaints 08/09/2020   Neuropathy 06/14/2020   Educated about COVID-19 virus infection 04/10/2019   Prediabetes 10/13/2018   LVH (left ventricular hypertrophy) due to hypertensive disease, without  heart failure 10/13/2018   Hospital discharge follow-up 07/08/2018   Hypothyroid 06/15/2017   Post herpetic neuralgia 06/06/2017   Encounter for preventive health examination 09/09/2014   Chronic GERD 09/09/2014   Hyperlipidemia 08/17/2014   S/P hysterectomy 04/28/2013   Initial Medicare annual wellness visit 04/28/2013   Depression     Social History   Tobacco Use   Smoking status: Never   Smokeless tobacco: Never  Substance Use Topics   Alcohol use: No    No Known Allergies  Current Meds  Medication Sig   albuterol  (VENTOLIN  HFA) 108 (90 Base) MCG/ACT inhaler Inhale 1-2 puffs into the lungs every 6 (six) hours as needed for wheezing or shortness of breath.   amLODipine  (NORVASC ) 5 MG tablet TAKE 1 TABLET BY MOUTH DAILY   azithromycin  (ZITHROMAX ) 250 MG tablet Take 1 tablet (250 mg total) by mouth 3 (three) times a week. M-W-F (Patient not taking: Reported on 09/20/2023)   Calcium  Citrate-Vitamin D  (CALCIUM  CITRATE + D PO) Take 1 tablet by mouth 2 (two) times daily.   citalopram  (CELEXA ) 20 MG tablet TAKE 1 TABLET BY MOUTH DAILY   famotidine  (PEPCID ) 20 MG tablet TAKE 1 TABLET BY MOUTH DAILY. BEFORE DINNER   ferrous sulfate  324 MG TBEC Take 2 tablets by mouth. 65 mg of elemental iron   fexofenadine (ALLEGRA) 180 MG tablet Take 180 mg by mouth daily.   Fluticasone -Umeclidin-Vilant (TRELEGY ELLIPTA ) 200-62.5-25 MCG/ACT AEPB Inhale 1 puff into the lungs daily.   levothyroxine  (SYNTHROID ) 50 MCG tablet TAKE 1 TABLET BY MOUTH DAILY ON  AN EMPTY STOMACH WITH A GLASS OF WATER AT LEAST 30 TO 60 MINUTES  BEFORE BREAKFAST   pantoprazole  (PROTONIX ) 40 MG tablet TAKE 1 TABLET BY MOUTH DAILY   rosuvastatin  (CRESTOR ) 10 MG tablet TAKE ONE TABLET BY MOUTH ONCE DAILY   solifenacin  (VESICARE ) 5 MG tablet TAKE ONE TABLET BY MOUTH EVERY DAY   traZODone  (DESYREL ) 50 MG tablet TAKE 1/2 TO 1 TABLET BY MOUTH AT BEDTIME AS NEEDED FOR SLEEP   vitamin B-12 (CYANOCOBALAMIN ) 1000 MCG tablet Take 1,000 mcg  by mouth daily. (Patient not taking: Reported on 09/12/2023)   [DISCONTINUED] Fluticasone -Umeclidin-Vilant (TRELEGY ELLIPTA ) 200-62.5-25 MCG/ACT AEPB Inhale 1 puff into the lungs daily.   [DISCONTINUED] gabapentin  (NEURONTIN ) 100 MG capsule TAKE 1 CAPSULE BY MOUTH 3 TIMES  DAILY    Immunization History  Administered Date(s) Administered   Fluad Quad(high Dose 65+) 12/06/2018   Fluad Trivalent(High Dose 65+) 01/16/2023   Influenza, High Dose Seasonal PF 04/22/2016, 06/05/2017   Influenza,inj,Quad PF,6+ Mos 04/26/2013, 01/30/2015   Influenza-Unspecified 01/24/2018, 12/06/2018, 01/02/2020, 04/02/2021, 02/03/2022   Moderna Covid-19 Fall Seasonal Vaccine 54yrs & older 02/10/2023   Moderna Covid-19 Vaccine Bivalent Booster 48yrs & up 02/05/2021, 02/03/2022   Moderna Sars-Covid-2 Vaccination 06/20/2019, 07/22/2019, 02/14/2020   PNEUMOCOCCAL CONJUGATE-20 11/09/2020   Pneumococcal Conjugate-13 09/01/2014, 01/24/2018   Pneumococcal Polysaccharide-23 04/26/2013, 02/28/2019   Rsv, Bivalent, Protein Subunit Rsvpref,pf Pattricia Bores) 05/26/2022   Tdap 06/18/2014, 05/26/2022   Zoster Recombinant(Shingrix) 07/08/2017, 09/22/2017        Objective:     BP 122/70 (BP Location: Left Arm, Patient Position: Sitting, Cuff Size: Normal)  Pulse 67   Temp 97.6 F (36.4 C) (Temporal)   Ht 5\' 3"  (1.6 m)   SpO2 97%   BMI 32.63 kg/m   SpO2: 97 %  GENERAL: Overweight woman, no acute distress.  Fully ambulatory, no conversational dyspnea, no pallor noted today. HEAD: Normocephalic, atraumatic.  EYES: Pupils equal, round, reactive to light.  No scleral icterus.  Pale conjunctiva. MOUTH: Natural dentition present, few chipped teeth.  Oral mucosa moist. NECK: Supple. No thyromegaly. Trachea midline. No JVD.  No adenopathy. PULMONARY: Lungs sound coarse but otherwise clear to auscultation bilaterally. CARDIOVASCULAR: S1 and S2. Regular rate and rhythm.  No rubs, murmurs gallops heard. GASTROINTESTINAL:  Benign. MUSCULOSKELETAL: No joint deformity, no clubbing, no edema.  NEUROLOGIC: No overt focal deficits. SKIN: Intact,warm,dry. PSYCH: Mood and behavior appropriate.        Assessment & Plan:     ICD-10-CM   1. Moderate persistent asthma without complication  J45.40     2. Bronchiectasis without complication (HCC)  J47.9     3. Chronic cough  R05.3     4. MDS (myelodysplastic syndrome), low grade (HCC)  D46.Z     5. Macrocytic anemia  D53.9     6. OSA (obstructive sleep apnea)  G47.33       Meds ordered this encounter  Medications   azithromycin  (ZITHROMAX ) 250 MG tablet    Sig: Take 1 tablet (250 mg total) by mouth 3 (three) times a week. M-W-F    Dispense:  12 each    Refill:  1   Discussion:    Asthma with chronic asthmatic bronchitis Asthma with chronic asthmatic bronchitis is well controlled. Persistent deep cough may be due to bronchiectasis. She prefers Trelegy over Breztri  and reports no issues with Trelegy. - Continue Trelegy inhaler.  Bronchiectasis with chronic cough Chronic cough likely related to bronchiectasis. Initiating azithromycin  to address cough. - Prescribe azithromycin  to be taken Monday, Wednesday, and Friday. - Provide 12 tablets with one refill.  Obstructive sleep apnea She has been without proper CPAP tubing for over ten days, affecting compliance. New Airfit nasal mask provided, but tubing is needed for connection. - Instruct her to contact supplier for CPAP tubing. - Advise her to try new Airfit nasal mask once tubing is received.  Myelodysplastic syndrome with anemia Anemia secondary to myelodysplastic syndrome contributes to shortness of breath. Hemoglobin levels have improved slightly from previous measurements.  Follow-up - Schedule follow-up appointment in three months.    Advised if symptoms do not improve or worsen, to please contact office for sooner follow up or seek emergency care.    I spent 40 minutes of dedicated to  the care of this patient on the date of this encounter to include pre-visit review of records, face-to-face time with the patient discussing conditions above, post visit ordering of testing, clinical documentation with the electronic health record, making appropriate referrals as documented, and communicating necessary findings to members of the patients care team.     C. Chloe Counter, MD Advanced Bronchoscopy PCCM [facility]    *This note was generated using voice recognition software/Dragon and/or AI transcription program.  Despite best efforts to proofread, errors can occur which can change the meaning. Any transcriptional errors that result from this process are unintentional and may not be fully corrected at the time of dictation.

## 2023-09-04 ENCOUNTER — Emergency Department

## 2023-09-04 ENCOUNTER — Other Ambulatory Visit: Payer: Self-pay

## 2023-09-04 ENCOUNTER — Ambulatory Visit: Payer: Self-pay

## 2023-09-04 ENCOUNTER — Inpatient Hospital Stay
Admission: EM | Admit: 2023-09-04 | Discharge: 2023-09-09 | DRG: 193 | Disposition: A | Discharge status: Home / Self Care | Attending: Internal Medicine | Admitting: Internal Medicine

## 2023-09-04 DIAGNOSIS — G4733 Obstructive sleep apnea (adult) (pediatric): Secondary | ICD-10-CM | POA: Diagnosis not present

## 2023-09-04 DIAGNOSIS — K5900 Constipation, unspecified: Secondary | ICD-10-CM | POA: Diagnosis present

## 2023-09-04 DIAGNOSIS — E669 Obesity, unspecified: Secondary | ICD-10-CM | POA: Diagnosis present

## 2023-09-04 DIAGNOSIS — E039 Hypothyroidism, unspecified: Secondary | ICD-10-CM | POA: Diagnosis present

## 2023-09-04 DIAGNOSIS — J189 Pneumonia, unspecified organism: Principal | ICD-10-CM | POA: Diagnosis present

## 2023-09-04 DIAGNOSIS — Z8249 Family history of ischemic heart disease and other diseases of the circulatory system: Secondary | ICD-10-CM

## 2023-09-04 DIAGNOSIS — Z6832 Body mass index (BMI) 32.0-32.9, adult: Secondary | ICD-10-CM | POA: Diagnosis not present

## 2023-09-04 DIAGNOSIS — K219 Gastro-esophageal reflux disease without esophagitis: Secondary | ICD-10-CM | POA: Diagnosis present

## 2023-09-04 DIAGNOSIS — M79602 Pain in left arm: Secondary | ICD-10-CM | POA: Diagnosis not present

## 2023-09-04 DIAGNOSIS — Z7951 Long term (current) use of inhaled steroids: Secondary | ICD-10-CM | POA: Diagnosis not present

## 2023-09-04 DIAGNOSIS — Z833 Family history of diabetes mellitus: Secondary | ICD-10-CM | POA: Diagnosis not present

## 2023-09-04 DIAGNOSIS — F329 Major depressive disorder, single episode, unspecified: Secondary | ICD-10-CM | POA: Diagnosis not present

## 2023-09-04 DIAGNOSIS — F32A Depression, unspecified: Secondary | ICD-10-CM | POA: Diagnosis present

## 2023-09-04 DIAGNOSIS — J9601 Acute respiratory failure with hypoxia: Secondary | ICD-10-CM | POA: Diagnosis not present

## 2023-09-04 DIAGNOSIS — Z1152 Encounter for screening for COVID-19: Secondary | ICD-10-CM | POA: Diagnosis not present

## 2023-09-04 DIAGNOSIS — Z79899 Other long term (current) drug therapy: Secondary | ICD-10-CM | POA: Diagnosis not present

## 2023-09-04 DIAGNOSIS — K7689 Other specified diseases of liver: Secondary | ICD-10-CM | POA: Diagnosis not present

## 2023-09-04 DIAGNOSIS — R1084 Generalized abdominal pain: Secondary | ICD-10-CM | POA: Diagnosis not present

## 2023-09-04 DIAGNOSIS — J440 Chronic obstructive pulmonary disease with acute lower respiratory infection: Secondary | ICD-10-CM | POA: Diagnosis not present

## 2023-09-04 DIAGNOSIS — R0989 Other specified symptoms and signs involving the circulatory and respiratory systems: Secondary | ICD-10-CM | POA: Diagnosis not present

## 2023-09-04 DIAGNOSIS — E785 Hyperlipidemia, unspecified: Secondary | ICD-10-CM | POA: Diagnosis not present

## 2023-09-04 DIAGNOSIS — J441 Chronic obstructive pulmonary disease with (acute) exacerbation: Secondary | ICD-10-CM | POA: Diagnosis present

## 2023-09-04 DIAGNOSIS — I10 Essential (primary) hypertension: Secondary | ICD-10-CM | POA: Diagnosis not present

## 2023-09-04 DIAGNOSIS — Z79890 Hormone replacement therapy (postmenopausal): Secondary | ICD-10-CM

## 2023-09-04 DIAGNOSIS — N281 Cyst of kidney, acquired: Secondary | ICD-10-CM | POA: Diagnosis not present

## 2023-09-04 DIAGNOSIS — R059 Cough, unspecified: Secondary | ICD-10-CM | POA: Diagnosis not present

## 2023-09-04 DIAGNOSIS — R0902 Hypoxemia: Secondary | ICD-10-CM | POA: Diagnosis not present

## 2023-09-04 DIAGNOSIS — Z83438 Family history of other disorder of lipoprotein metabolism and other lipidemia: Secondary | ICD-10-CM

## 2023-09-04 DIAGNOSIS — M7989 Other specified soft tissue disorders: Secondary | ICD-10-CM | POA: Diagnosis not present

## 2023-09-04 DIAGNOSIS — Z981 Arthrodesis status: Secondary | ICD-10-CM | POA: Diagnosis not present

## 2023-09-04 DIAGNOSIS — D696 Thrombocytopenia, unspecified: Secondary | ICD-10-CM | POA: Diagnosis not present

## 2023-09-04 DIAGNOSIS — R918 Other nonspecific abnormal finding of lung field: Secondary | ICD-10-CM | POA: Diagnosis not present

## 2023-09-04 DIAGNOSIS — I471 Supraventricular tachycardia, unspecified: Secondary | ICD-10-CM | POA: Diagnosis not present

## 2023-09-04 DIAGNOSIS — Z8709 Personal history of other diseases of the respiratory system: Secondary | ICD-10-CM | POA: Diagnosis present

## 2023-09-04 LAB — URINALYSIS, ROUTINE W REFLEX MICROSCOPIC
Bilirubin Urine: NEGATIVE
Glucose, UA: NEGATIVE mg/dL
Hgb urine dipstick: NEGATIVE
Ketones, ur: 20 mg/dL — AB
Nitrite: NEGATIVE
Protein, ur: 100 mg/dL — AB
Specific Gravity, Urine: 1.028 (ref 1.005–1.030)
pH: 5 (ref 5.0–8.0)

## 2023-09-04 LAB — COMPREHENSIVE METABOLIC PANEL WITH GFR
ALT: 17 U/L (ref 0–44)
AST: 17 U/L (ref 15–41)
Albumin: 4.1 g/dL (ref 3.5–5.0)
Alkaline Phosphatase: 66 U/L (ref 38–126)
Anion gap: 9 (ref 5–15)
BUN: 14 mg/dL (ref 8–23)
CO2: 26 mmol/L (ref 22–32)
Calcium: 9.2 mg/dL (ref 8.9–10.3)
Chloride: 101 mmol/L (ref 98–111)
Creatinine, Ser: 1.02 mg/dL — ABNORMAL HIGH (ref 0.44–1.00)
GFR, Estimated: 58 mL/min — ABNORMAL LOW (ref >60)
Glucose, Bld: 128 mg/dL — ABNORMAL HIGH (ref 70–99)
Potassium: 3.6 mmol/L (ref 3.5–5.1)
Sodium: 136 mmol/L (ref 135–145)
Total Bilirubin: 0.9 mg/dL (ref 0.0–1.2)
Total Protein: 7.2 g/dL (ref 6.5–8.1)

## 2023-09-04 LAB — RESP PANEL BY RT-PCR (RSV, FLU A&B, COVID)  RVPGX2
Influenza A by PCR: NEGATIVE
Influenza B by PCR: NEGATIVE
Resp Syncytial Virus by PCR: NEGATIVE
SARS Coronavirus 2 by RT PCR: NEGATIVE

## 2023-09-04 LAB — CBC
HCT: 31.3 % — ABNORMAL LOW (ref 36.0–46.0)
Hemoglobin: 10.3 g/dL — ABNORMAL LOW (ref 12.0–15.0)
MCH: 37.7 pg — ABNORMAL HIGH (ref 26.0–34.0)
MCHC: 32.9 g/dL (ref 30.0–36.0)
MCV: 114.7 fL — ABNORMAL HIGH (ref 80.0–100.0)
Platelets: 106 10*3/uL — ABNORMAL LOW (ref 150–400)
RBC: 2.73 MIL/uL — ABNORMAL LOW (ref 3.87–5.11)
RDW: 13.8 % (ref 11.5–15.5)
WBC: 10.2 10*3/uL (ref 4.0–10.5)
nRBC: 0 % (ref 0.0–0.2)

## 2023-09-04 LAB — LIPASE, BLOOD: Lipase: 22 U/L (ref 11–51)

## 2023-09-04 MED ORDER — CITALOPRAM HYDROBROMIDE 10 MG PO TABS
20.0000 mg | ORAL_TABLET | Freq: Every day | ORAL | Status: DC
  Administered 2023-09-05 – 2023-09-09 (×5): 20 mg via ORAL
  Filled 2023-09-04 (×5): qty 2

## 2023-09-04 MED ORDER — VITAMIN B-12 1000 MCG PO TABS
1000.0000 ug | ORAL_TABLET | Freq: Every day | ORAL | Status: DC
Start: 2023-09-05 — End: 2023-09-05
  Administered 2023-09-05: 1000 ug via ORAL
  Filled 2023-09-04: qty 1

## 2023-09-04 MED ORDER — IPRATROPIUM-ALBUTEROL 0.5-2.5 (3) MG/3ML IN SOLN
3.0000 mL | Freq: Four times a day (QID) | RESPIRATORY_TRACT | Status: AC
  Administered 2023-09-05 (×2): 3 mL via RESPIRATORY_TRACT
  Filled 2023-09-04 (×2): qty 3

## 2023-09-04 MED ORDER — PANTOPRAZOLE SODIUM 40 MG PO TBEC
40.0000 mg | DELAYED_RELEASE_TABLET | Freq: Every day | ORAL | Status: DC
  Administered 2023-09-05 – 2023-09-09 (×5): 40 mg via ORAL
  Filled 2023-09-04 (×5): qty 1

## 2023-09-04 MED ORDER — SENNOSIDES-DOCUSATE SODIUM 8.6-50 MG PO TABS: 1.0000 | ORAL_TABLET | Freq: Every evening | ORAL | Status: DC | PRN

## 2023-09-04 MED ORDER — ONDANSETRON HCL 4 MG/2ML IJ SOLN: 4.0000 mg | Freq: Four times a day (QID) | INTRAMUSCULAR | Status: DC | PRN

## 2023-09-04 MED ORDER — ACETAMINOPHEN 325 MG PO TABS
650.0000 mg | ORAL_TABLET | Freq: Four times a day (QID) | ORAL | Status: DC | PRN
  Administered 2023-09-04 – 2023-09-08 (×3): 650 mg via ORAL
  Filled 2023-09-04 (×3): qty 2

## 2023-09-04 MED ORDER — HYDRALAZINE HCL 20 MG/ML IJ SOLN: 5.0000 mg | Freq: Four times a day (QID) | INTRAMUSCULAR | Status: DC | PRN

## 2023-09-04 MED ORDER — IPRATROPIUM-ALBUTEROL 0.5-2.5 (3) MG/3ML IN SOLN
3.0000 mL | Freq: Once | RESPIRATORY_TRACT | Status: AC
  Administered 2023-09-04: 3 mL via RESPIRATORY_TRACT
  Filled 2023-09-04: qty 3

## 2023-09-04 MED ORDER — BUDESON-GLYCOPYRROL-FORMOTEROL 160-9-4.8 MCG/ACT IN AERO
2.0000 | INHALATION_SPRAY | Freq: Two times a day (BID) | RESPIRATORY_TRACT | Status: DC
  Administered 2023-09-05 – 2023-09-06 (×3): 2 via RESPIRATORY_TRACT
  Filled 2023-09-04 (×2): qty 5.9

## 2023-09-04 MED ORDER — LACTATED RINGERS IV BOLUS
1000.0000 mL | Freq: Once | INTRAVENOUS | Status: AC
  Administered 2023-09-04: 1000 mL via INTRAVENOUS

## 2023-09-04 MED ORDER — FERROUS SULFATE 325 (65 FE) MG PO TABS
648.0000 mg | ORAL_TABLET | Freq: Every day | ORAL | Status: DC
  Administered 2023-09-05 – 2023-09-09 (×5): 648 mg via ORAL
  Filled 2023-09-04 (×5): qty 2

## 2023-09-04 MED ORDER — HEPARIN SODIUM (PORCINE) 5000 UNIT/ML IJ SOLN
5000.0000 [IU] | Freq: Three times a day (TID) | INTRAMUSCULAR | Status: DC
  Administered 2023-09-04 – 2023-09-08 (×9): 5000 [IU] via SUBCUTANEOUS
  Filled 2023-09-04 (×10): qty 1

## 2023-09-04 MED ORDER — HYDROCOD POLI-CHLORPHE POLI ER 10-8 MG/5ML PO SUER
5.0000 mL | Freq: Every evening | ORAL | Status: AC | PRN
  Administered 2023-09-05 (×2): 5 mL via ORAL
  Filled 2023-09-04 (×2): qty 5

## 2023-09-04 MED ORDER — GABAPENTIN 100 MG PO CAPS
100.0000 mg | ORAL_CAPSULE | Freq: Three times a day (TID) | ORAL | Status: DC
Start: 2023-09-05 — End: 2023-09-09
  Administered 2023-09-05 – 2023-09-09 (×13): 100 mg via ORAL
  Filled 2023-09-04 (×13): qty 1

## 2023-09-04 MED ORDER — AMLODIPINE BESYLATE 5 MG PO TABS
5.0000 mg | ORAL_TABLET | Freq: Every day | ORAL | Status: DC
  Administered 2023-09-05 – 2023-09-09 (×5): 5 mg via ORAL
  Filled 2023-09-04 (×5): qty 1

## 2023-09-04 MED ORDER — SODIUM CHLORIDE 0.9 % IV SOLN
500.0000 mg | INTRAVENOUS | Status: DC
  Administered 2023-09-04 – 2023-09-05 (×2): 500 mg via INTRAVENOUS
  Filled 2023-09-04 (×2): qty 5

## 2023-09-04 MED ORDER — ALBUTEROL SULFATE (2.5 MG/3ML) 0.083% IN NEBU
10.0000 mg | INHALATION_SOLUTION | Freq: Once | RESPIRATORY_TRACT | Status: AC
  Administered 2023-09-04: 10 mg via RESPIRATORY_TRACT
  Filled 2023-09-04: qty 12

## 2023-09-04 MED ORDER — ACETAMINOPHEN 650 MG RE SUPP: 650.0000 mg | Freq: Four times a day (QID) | RECTAL | Status: DC | PRN

## 2023-09-04 MED ORDER — TRAZODONE HCL 50 MG PO TABS: 25.0000 mg | ORAL_TABLET | Freq: Every evening | ORAL | Status: DC | PRN

## 2023-09-04 MED ORDER — ROSUVASTATIN CALCIUM 10 MG PO TABS
10.0000 mg | ORAL_TABLET | Freq: Every day | ORAL | Status: DC
  Administered 2023-09-05 – 2023-09-09 (×5): 10 mg via ORAL
  Filled 2023-09-04 (×5): qty 1

## 2023-09-04 MED ORDER — LEVOTHYROXINE SODIUM 50 MCG PO TABS
50.0000 ug | ORAL_TABLET | Freq: Every day | ORAL | Status: DC
  Administered 2023-09-05 – 2023-09-09 (×5): 50 ug via ORAL
  Filled 2023-09-04 (×5): qty 1

## 2023-09-04 MED ORDER — DEXAMETHASONE SODIUM PHOSPHATE 10 MG/ML IJ SOLN
10.0000 mg | Freq: Once | INTRAMUSCULAR | Status: AC
  Administered 2023-09-04: 10 mg via INTRAVENOUS
  Filled 2023-09-04: qty 1

## 2023-09-04 MED ORDER — SODIUM CHLORIDE 0.9 % IV SOLN
1.0000 g | Freq: Once | INTRAVENOUS | Status: AC
  Administered 2023-09-04: 1 g via INTRAVENOUS
  Filled 2023-09-04: qty 10

## 2023-09-04 MED ORDER — IOHEXOL 300 MG/ML  SOLN
100.0000 mL | Freq: Once | INTRAMUSCULAR | Status: AC | PRN
Start: 2023-09-04 — End: 2023-09-04
  Administered 2023-09-04: 100 mL via INTRAVENOUS

## 2023-09-04 MED ORDER — SODIUM CHLORIDE 0.9 % IV SOLN: 500.0000 mg | INTRAVENOUS | Status: DC

## 2023-09-04 MED ORDER — GUAIFENESIN 100 MG/5ML PO LIQD
5.0000 mL | Freq: Four times a day (QID) | ORAL | Status: AC | PRN
  Administered 2023-09-05 – 2023-09-07 (×7): 5 mL via ORAL
  Filled 2023-09-04 (×7): qty 10

## 2023-09-04 MED ORDER — ONDANSETRON HCL 4 MG PO TABS: 4.0000 mg | ORAL_TABLET | Freq: Four times a day (QID) | ORAL | Status: DC | PRN

## 2023-09-04 MED ORDER — ONDANSETRON HCL 4 MG/2ML IJ SOLN
4.0000 mg | Freq: Once | INTRAMUSCULAR | Status: AC
  Administered 2023-09-04: 4 mg via INTRAVENOUS
  Filled 2023-09-04: qty 2

## 2023-09-04 MED ORDER — SODIUM CHLORIDE 0.9 % IV SOLN
2.0000 g | INTRAVENOUS | Status: DC
  Administered 2023-09-05: 2 g via INTRAVENOUS
  Filled 2023-09-04: qty 20

## 2023-09-04 NOTE — Assessment & Plan Note (Signed)
 Home PPI resumed

## 2023-09-04 NOTE — Assessment & Plan Note (Signed)
 Home citalopram  20 mg daily resumed Home trazodone  25 to 50 mg nightly as needed for sleep resumed

## 2023-09-04 NOTE — Assessment & Plan Note (Signed)
 -  Levothyroxine 50 mcg daily resumed

## 2023-09-04 NOTE — ED Triage Notes (Addendum)
 Pt comes with belly pain and vomiting that started on Saturday. Pt states it has gotten worse. Pt states pain all over and just feeling so sick. Pt states dec intake.   Pt states cough and feeling very cold.

## 2023-09-04 NOTE — Telephone Encounter (Signed)
 E2C2 Pulmonary Triage - Initial Assessment Questions "Chief Complaint (e.g., cough, sob, wheezing, fever, chills, sweat or additional symptoms) *Go to specific symptom protocol after initial questions. Patient reports seeing Dr. Viva Grise on Thursday. Patient reports shortness of breath, coughing and wheezing along with severe vomiting. Patient is actively vomiting on the phone. Patient reports she has been feeling decently up until Saturday. Patient states she went to a Mother's Day celebration on Saturday. Patient endorses she started vomiting on Saturday night and had been doing that since then. Patient is actively wheezing and sounds generally ill. Patient reports not using her inhalers this morning due to how she was feeling.  Patient is recommended to be seen in the ED. Patient refused to allow this RN to call 911. Patient states she would call a friend to take her to the ED. Patient is instructed to call 911 if her friend isn't able to come get her in an appropriate amount of time. Patient verbalized understanding.   "How long have symptoms been present?" Saturday  Have you tested for COVID or Flu? Note: If not, ask patient if a home test can be taken. If so, instruct patient to call back for positive results. No  MEDICINES:   "Have you used any OTC meds to help with symptoms?" No If yes, ask "What medications?"   "Have you used your inhalers/maintenance medication?" Yes If yes, "What medications?" Albuterol  inhaler Trelegy  If inhaler, ask "How many puffs and how often?" Note: Review instructions on medication in the chart. Albuterol  1-2 puffs every 6 hours PRN Trelegy 1 puff  OXYGEN: "Do you wear supplemental oxygen?" No If yes, "How many liters are you supposed to use?"   "Do you monitor your oxygen levels?" Yes If yes, "What is your reading (oxygen level) today?" Patient unable to tell me what her readings are. Patient states she doesn't know where her pulse oximeter  is  "What is your usual oxygen saturation reading?"  (Note: Pulmonary O2 sats should be 90% or greater)  Copied from CRM #[id number]. Topic: Clinical - Red Word Triage >> Sep 04, 2023  1:20 PM Whitney O wrote: [facility] that prompted transfer to Nurse Triage: wheezing deep cough throwing up and staggering around and just vomited   again  Seen dr Viva Grise on Thursday Reason for Disposition  [1] MODERATE difficulty breathing (e.g., speaks in phrases, SOB even at rest, pulse 100-120) AND [2] still present when not coughing  Answer Assessment - Initial Assessment Questions 1. ONSET: "When did the cough begin?"      Chronic cough 2. SEVERITY: "How bad is the cough today?"      severe 3. SPUTUM: "Describe the color of your sputum" (none, dry cough; clear, white, yellow, green)     yellow 4. HEMOPTYSIS: "Are you coughing up any blood?" If so ask: "How much?" (flecks, streaks, tablespoons, etc.)     no 5. DIFFICULTY BREATHING: "Are you having difficulty breathing?" If Yes, ask: "How bad is it?" (e.g., mild, moderate, severe)    - MILD: No SOB at rest, mild SOB with walking, speaks normally in sentences, can lie down, no retractions, pulse < 100.    - MODERATE: SOB at rest, SOB with minimal exertion and prefers to sit, cannot lie down flat, speaks in phrases, mild retractions, audible wheezing, pulse 100-120.    - SEVERE: Very SOB at rest, speaks in single words, struggling to breathe, sitting hunched forward, retractions, pulse > 120      moderate 6. FEVER: "  Do you have a fever?" If Yes, ask: "What is your temperature, how was it measured, and when did it start?"     Unable to say-hadn't checked her temperature 7. CARDIAC HISTORY: "Do you have any history of heart disease?" (e.g., heart attack, congestive heart failure)       8. LUNG HISTORY: "Do you have any history of lung disease?"  (e.g., pulmonary embolus, asthma, emphysema)     Bronchiectasis, asthma 9. PE RISK FACTORS: "Do you have a history  of blood clots?" (or: recent major surgery, recent prolonged travel, bedridden)     no 10. OTHER SYMPTOMS: "Do you have any other symptoms?" (e.g., runny nose, wheezing, chest pain)       Wheezing, vomiting  Protocols used: Cough - Acute Non-Productive-A-AH

## 2023-09-04 NOTE — Assessment & Plan Note (Signed)
 Azithromycin  500 mg IV daily, ceftriaxone  2 g IV daily, 5 days ordered

## 2023-09-04 NOTE — Assessment & Plan Note (Signed)
 Suspect secondary to right middle lobe pneumonia Continue ceftriaxone  2 g IV daily, azithromycin  500 mg IV daily to complete a 5-day course Guaifenesin  as needed to loosen phlegm Tussionex nightly as needed for cough, 2 doses ordered DuoNebs 2 scheduled doses ordered on admission

## 2023-09-04 NOTE — H&P (Addendum)
 History and Physical   ANALAURA BESTER ZOX:[id number] DOB: 01/08/1951 DOA: 09/04/2023  PCP: Thersia Flax, MD  Outpatient Specialists: Dr. Viva Grise Patient coming from: home  I have personally briefly reviewed patient's old medical records in [facility] EMR.  Chief Concern: shortness of breath, cough  HPI: Ms. Riella Lillis is a 32 female with hypertension, hypothyroid, hyperlipidemia, GERD, who presents emergency department for chief concerns of cough, wheezing, shortness of breath.  Vitals in the ED showed temperature of 98.1, respiration rate 19, heart rate 101, blood pressure 115/61, SpO2 91%.  Patient desatted to 88% on room air and was placed on 2 L nasal cannula.  Serum sodium is 136, potassium 3.6, chloride 101, bicarb 26, bun 14, sCr 1.02, nonfasting blood glucose is 128, wbc 10.2, hgb 10.3, platelets 106.   CT Abd/Pel w contrast: Very mild tree-in-bud changes in the right middle lobe which may represent acute infiltrate. Simple hepatic and renal cysts are noted. No follow-up is recommended.  CXR: Nonspecific opacities overlying the lateral aspect of the right lower lung zone these may represent focal pneumonitis versus Atelectasis  ED treatment: DuoNebs one-time dose, Decadron  10 mg IV one-time dose, albuterol  nebulizer, ondansetron  4 mg IV one-time dose, ceftriaxone  1 g IV, LR 1 L bolus. ----------------------------------------- At bedside, patient is able to tell me her first and last name, age, location, current calendar year.  She reports that she has been having shortness of breath and cough since 3 AM on Saturday.  She also endorses vomiting 1 time and diarrhea on Saturday.  She reports the cough is not productive.  She reports that she does wear her CPAP machine at night and that her tubing currently at home has not been working.  She is requesting CPAP machine in the hospital.  She endorses shortness of breath and denies chest pain, dysuria, hematuria, current  diarrhea, swelling of her lower extremities, trauma to her person.  She reports that her roommate, Felipa Horsfall has been coughing lately though she is uncertain if Felipa Horsfall is sick.  She denies fever.  Social history: She lives at home and has a roommate who lives with her.  She denies tobacco, EtOH, recreational drug use.  She was exposed to secondhand smoking through her parents and through her work continuously every day.  She is retired.  ROS: Constitutional: no weight change, no fever ENT/Mouth: no sore throat, no rhinorrhea Eyes: no eye pain, no vision changes Cardiovascular: no chest pain, + dyspnea,  no edema, no palpitations Respiratory: + cough, no sputum, no wheezing Gastrointestinal: no nausea, no vomiting, no diarrhea, no constipation Genitourinary: no urinary incontinence, no dysuria, no hematuria Musculoskeletal: no arthralgias, no myalgias Skin: no skin lesions, no pruritus, Neuro: + weakness, no loss of consciousness, no syncope Psych: no anxiety, no depression, + decrease appetite Heme/Lymph: no bruising, no bleeding  ED Course: Discussed with EDP, patient requiring hospitalization for chief concerns of acute hypoxic respiratory failure suspect secondary to right middle lobe pneumonia.  Assessment/Plan  Principal Problem:   Acute hypoxic respiratory failure (HCC) Active Problems:   Right middle lobe pneumonia   Depression   Hyperlipidemia   Hypothyroid   Essential hypertension   Thrombocytopenia (HCC)   Chronic GERD   OSA (obstructive sleep apnea)   Assessment and Plan:  * Acute hypoxic respiratory failure (HCC) Suspect secondary to right middle lobe pneumonia Continue ceftriaxone  2 g IV daily, azithromycin  500 mg IV daily to complete a 5-day course Guaifenesin  as needed to loosen phlegm Tussionex nightly  as needed for cough, 2 doses ordered DuoNebs 2 scheduled doses ordered on admission  Right middle lobe pneumonia Azithromycin  500 mg IV daily, ceftriaxone   2 g IV daily, 5 days ordered  Essential hypertension Hydralazine  5 mg IV every 6 hours as needed for SBP greater than 165, 5 days ordered  Hypothyroid Levothyroxine  50 mcg daily resumed  Hyperlipidemia Rosuvastatin  10 mg daily resumed  Depression Home citalopram  20 mg daily resumed Home trazodone  25 to 50 mg nightly as needed for sleep resumed  OSA (obstructive sleep apnea) CPAP nightly ordered  Chronic GERD Home PPI resumed  Chart reviewed.   DVT prophylaxis: Heparin  5000 units subcutaneous every 8 hours Code Status: full code Diet: Heart healthy Family Communication: updated friend, Lenora at bedside with patient's permission  Disposition Plan: Pending clinical course Consults called: none at this time  Admission status: Telemetry medical, inpatient  Past Medical History:  Diagnosis Date   Arthritis    Asthma    CAP (community acquired pneumonia) 08/11/2022   Depression    Dyspnea    with heat   Environmental and seasonal allergies    GERD (gastroesophageal reflux disease)    Hyperlipidemia    Hypertension    Hypothyroidism    Presence of dental prosthetic device    Dental implants   Wheezing    Past Surgical History:  Procedure Laterality Date   ABDOMINAL HYSTERECTOMY  04/26/1987   ANTERIOR CERVICAL DECOMPRESSION/DISCECTOMY FUSION 4 LEVELS N/A 11/10/2021   Procedure: C3-7 ANTERIOR CERVICAL DISCECTOMY AND FUSION (GLOBUS FORGE);  Surgeon: Jodeen Munch, MD;  Location: [facility] OR[facility];  Service: Neurosurgery;  Laterality: N/A;   BROW LIFT Bilateral 02/07/2020   Procedure: BLEPHAROPLASTY UPPER EYELID; W/EXCESS SKIN BROW PTOSIS REPAIR BILATERAL;  Surgeon: Zacarias Hermann, MD;  Location: [facility];  Service: Ophthalmology;  Laterality: Bilateral;   CATARACT EXTRACTION W/PHACO Right 09/13/2016   Procedure: CATARACT EXTRACTION PHACO AND INTRAOCULAR LENS PLACEMENT (IOC);  Surgeon: Clair Crews, MD;  Location: [facility];  Service: Ophthalmology;  Laterality:  Right;  US  00:38 AP% 14.3 CDE 5.51 Fluid pack lot # [id number]   CATARACT EXTRACTION W/PHACO Left 10/11/2016   Procedure: CATARACT EXTRACTION PHACO AND INTRAOCULAR LENS PLACEMENT (IOC);  Surgeon: Clair Crews, MD;  Location: [facility];  Service: Ophthalmology;  Laterality: Left;  US  00:30 AP% 11.3 CDE 3.50 fluid pack lot # [id number]   COLONOSCOPY WITH PROPOFOL  N/A 09/30/2014   Procedure: COLONOSCOPY WITH PROPOFOL ;  Surgeon: Marshall Skeeter, MD;  Location: [facility];  Service: Endoscopy;  Laterality: N/A;   COLONOSCOPY WITH PROPOFOL  N/A 03/24/2021   Procedure: COLONOSCOPY WITH PROPOFOL ;  Surgeon: Selena Daily, MD;  Location: [facility];  Service: Gastroenterology;  Laterality: N/A;   ESOPHAGOGASTRODUODENOSCOPY N/A 09/30/2014   Procedure: ESOPHAGOGASTRODUODENOSCOPY (EGD);  Surgeon: Marshall Skeeter, MD;  Location: [facility] END[facility];  Service: Endoscopy;  Laterality: N/A;   ESOPHAGOGASTRODUODENOSCOPY N/A 03/24/2021   Procedure: ESOPHAGOGASTRODUODENOSCOPY (EGD);  Surgeon: Selena Daily, MD;  Location: [facility];  Service: Gastroenterology;  Laterality: N/A;   FOOT SURGERY Bilateral 04/26/1983   KNEE SURGERY  04/25/2014   Social History:  reports that she has never smoked. She has never used smokeless tobacco. She reports that she does not drink alcohol and does not use drugs.  No Known Allergies Family History  Problem Relation Age of Onset   Diabetes Mother    Hyperlipidemia Mother    Heart disease Mother    Prostate cancer Neg Hx    Kidney cancer Neg Hx    Bladder  Cancer Neg Hx    Family history: Family history reviewed and not pertinent.  Prior to Admission medications   Medication Sig Start Date End Date Taking? Authorizing Provider  albuterol  (VENTOLIN  HFA) 108 (90 Base) MCG/ACT inhaler Inhale 1-2 puffs into the lungs every 6 (six) hours as needed for wheezing or shortness of breath. 05/05/23   Thersia Flax, MD  amLODipine  (NORVASC ) 5 MG tablet TAKE  1 TABLET BY MOUTH DAILY 08/14/23   Thersia Flax, MD  azithromycin  (ZITHROMAX ) 250 MG tablet Take 1 tablet (250 mg total) by mouth 3 (three) times a week. M-W-F 09/01/23   Marc Senior, MD  Calcium  Citrate-Vitamin D  (CALCIUM  CITRATE + D PO) Take 1 tablet by mouth 2 (two) times daily.    [provider]  citalopram  (CELEXA ) 20 MG tablet TAKE 1 TABLET BY MOUTH DAILY 01/17/23   Thersia Flax, MD  famotidine  (PEPCID ) 20 MG tablet TAKE 1 TABLET BY MOUTH DAILY. BEFORE DINNER 07/28/17   Thersia Flax, MD  ferrous sulfate  324 MG TBEC Take 2 tablets by mouth. 65 mg of elemental iron    [provider]  fexofenadine (ALLEGRA) 180 MG tablet Take 180 mg by mouth daily.    [provider]  Fluticasone -Umeclidin-Vilant (TRELEGY ELLIPTA ) 200-62.5-25 MCG/ACT AEPB Inhale 1 puff into the lungs daily. 03/02/23   Marc Senior, MD  Fluticasone -Umeclidin-Vilant (TRELEGY ELLIPTA ) 200-62.5-25 MCG/ACT AEPB Inhale 1 puff into the lungs daily. 05/29/23   Marc Senior, MD  gabapentin  (NEURONTIN ) 100 MG capsule TAKE 1 CAPSULE BY MOUTH 3 TIMES  DAILY 09/01/23   Thersia Flax, MD  levothyroxine  (SYNTHROID ) 50 MCG tablet TAKE 1 TABLET BY MOUTH DAILY ON  AN EMPTY STOMACH WITH A GLASS OF WATER AT LEAST 30 TO 60 MINUTES  BEFORE BREAKFAST 08/14/23   Thersia Flax, MD  pantoprazole  (PROTONIX ) 40 MG tablet TAKE 1 TABLET BY MOUTH DAILY 08/14/23   Thersia Flax, MD  rosuvastatin  (CRESTOR ) 10 MG tablet TAKE ONE TABLET BY MOUTH ONCE DAILY 06/29/23   Thersia Flax, MD  solifenacin  (VESICARE ) 5 MG tablet TAKE ONE TABLET BY MOUTH EVERY DAY 03/16/23   Thersia Flax, MD  traZODone  (DESYREL ) 50 MG tablet TAKE 1/2 TO 1 TABLET BY MOUTH AT BEDTIME AS NEEDED FOR SLEEP 07/24/23   Thersia Flax, MD  vitamin B-12 (CYANOCOBALAMIN ) 1000 MCG tablet Take 1,000 mcg by mouth daily.    [provider]   Physical Exam: Vitals:   09/04/23 1638 09/04/23 1640 09/04/23 1840 09/04/23 1900  BP:  115/61  (!)  124/49  Pulse:  (!) 101 (!) 104 97  Resp:  19 17 19   Temp:  98.1 F (36.7 C)    SpO2:  91% (!) 88% 99%  Weight: 83.5 kg     Height: 5\' 3"  (1.6 m)      Constitutional: appears age-appropriate Eyes: PERRL, lids and conjunctivae normal ENMT: Mucous membranes are moist. Posterior pharynx clear of any exudate or lesions. Age-appropriate dentition. Hearing appropriate Neck: normal, supple, no masses, no thyromegaly Respiratory: clear to auscultation bilaterally. No wheezing, no crackles on auscultation.  Increased respiratory effort.  Mild increased accessory muscle use.  Cardiovascular: Regular rate and rhythm, no murmurs / rubs / gallops. No extremity edema. 2+ pedal pulses. No carotid bruits.  Abdomen: Obese abdomen, no tenderness, no masses palpated, no hepatosplenomegaly. Bowel sounds positive.  Musculoskeletal: no clubbing / cyanosis. No joint deformity upper and lower extremities. Good ROM, no contractures, no atrophy.  Normal muscle tone.  Skin: no rashes, lesions, ulcers. No induration Neurologic: Sensation intact. Strength 5/5 in all 4.  Psychiatric: Normal judgment and insight. Alert and oriented x 3. Normal mood.   EKG: not indicated at this time  Chest x-ray on Admission: I personally reviewed and I agree with radiologist reading as below.  CT ABDOMEN PELVIS W CONTRAST Result Date: 09/04/2023 CLINICAL DATA:  Generalized abdominal pain on the right for 2 days, initial encounter EXAM: CT ABDOMEN AND PELVIS WITH CONTRAST TECHNIQUE: Multidetector CT imaging of the abdomen and pelvis was performed using the standard protocol following bolus administration of intravenous contrast. RADIATION DOSE REDUCTION: This exam was performed according to the departmental dose-optimization program which includes automated exposure control, adjustment of the mA and/or kV according to patient size and/or use of iterative reconstruction technique. CONTRAST:  OMNIPAQUE  IOHEXOL  300 MG/ML  SOLN  COMPARISON:  None Available. FINDINGS: Lower chest: Lung bases demonstrate some minimal tree-in-bud changes in the right mid of uncertain chronicity. Hepatobiliary: Simple cyst is noted in the left hepatic lobe. No other hepatic abnormality is noted. The gallbladder is within normal limits. Pancreas: Unremarkable. No pancreatic ductal dilatation or surrounding inflammatory changes. Spleen: Normal in size without focal abnormality. Adrenals/Urinary Tract: Adrenal glands are within normal limits. Kidneys are well visualized bilaterally. Multiple parapelvic renal cysts are noted bilaterally. No renal calculi or obstructive changes are seen. Normal excretion is noted on delayed images. Bladder is decompressed. Stomach/Bowel: Colon shows scattered fecal material throughout without obstructive change. The appendix is not well visualized. No inflammatory changes to suggest appendicitis are seen. Small bowel and stomach are unremarkable. Vascular/Lymphatic: Aortic atherosclerosis. No enlarged abdominal or pelvic lymph nodes. Reproductive: Status post hysterectomy. No adnexal masses. Other: No abdominal wall hernia or abnormality. No abdominopelvic ascites. Musculoskeletal: No acute or significant osseous findings. IMPRESSION: Very mild tree-in-bud changes in the right middle lobe which may represent acute infiltrate. Simple hepatic and renal cysts are noted. No follow-up is recommended. No acute abnormality to correspond with the given clinical history is seen. Electronically Signed   By: Violeta Grey M.D.   On: 09/04/2023 19:34   DG Chest 2 View Result Date: 09/04/2023 CLINICAL DATA:  Cough. EXAM: CHEST - 2 VIEW COMPARISON:  08/15/2022. FINDINGS: There are nonspecific opacities seen overlying the lateral aspect of the right lower lung zone. Bilateral lung fields are otherwise clear. No dense consolidation or lung collapse. Bilateral costophrenic angles are clear. Normal cardio-mediastinal silhouette. No acute osseous  abnormalities. Lower cervical spinal fixation hardware noted. The soft tissues are within normal limits. IMPRESSION: *Nonspecific opacities overlying the lateral aspect of the right lower lung zone these may represent focal pneumonitis versus atelectasis. Follow-up to clearing is recommended. Electronically Signed   By: Beula Brunswick M.D.   On: 09/04/2023 17:33   Labs on Admission: I have personally reviewed following labs  CBC: Recent Labs  Lab 09/04/23 1640  WBC 10.2  HGB 10.3*  HCT 31.3*  MCV 114.7*  PLT 106*   Basic Metabolic Panel: Recent Labs  Lab 09/04/23 1640  NA 136  K 3.6  CL 101  CO2 26  GLUCOSE 128*  BUN 14  CREATININE 1.02*  CALCIUM  9.2   GFR: Estimated Creatinine Clearance: 49.5 mL/min (A) (by C-G formula based on SCr of 1.02 mg/dL (H)).  Liver Function Tests: Recent Labs  Lab 09/04/23 1640  AST 17  ALT 17  ALKPHOS 66  BILITOT 0.9  PROT 7.2  ALBUMIN 4.1   Recent Labs  Lab 09/04/23 1640  LIPASE 22   Urine analysis:    Component Value Date/Time   COLORURINE AMBER (A) 09/04/2023 1640   APPEARANCEUR HAZY (A) 09/04/2023 1640   LABSPEC 1.028 09/04/2023 1640   PHURINE 5.0 09/04/2023 1640   GLUCOSEU NEGATIVE 09/04/2023 1640   GLUCOSEU NEGATIVE 05/18/2022 1424   HGBUR NEGATIVE 09/04/2023 1640   BILIRUBINUR NEGATIVE 09/04/2023 1640   BILIRUBINUR negative 05/05/2023 0922   KETONESUR 20 (A) 09/04/2023 1640   PROTEINUR 100 (A) 09/04/2023 1640   UROBILINOGEN 0.2 05/05/2023 0922   UROBILINOGEN 0.2 05/18/2022 1424   NITRITE NEGATIVE 09/04/2023 1640   LEUKOCYTESUR SMALL (A) 09/04/2023 1640   This document was prepared using Dragon Voice Recognition software and may include unintentional dictation errors.  Dr. Reinhold Carbine [facility]  If 7PM-7AM, please contact overnight-coverage provider If 7AM-7PM, please contact day attending provider [email or web address]  09/04/2023, 10:14 PM

## 2023-09-04 NOTE — Telephone Encounter (Signed)
 Agree with referral to EDP.  Does not appear to be a PRIMARY pulmonary problem.  Suspect symptoms are related to her vomiting which may be related to a gastroenteritis or similar problem.

## 2023-09-04 NOTE — ED Provider Notes (Signed)
 [facility] Provider Note    Event Date/Time   First MD Initiated Contact with Patient 09/04/23 1741     (approximate)   History   Emesis   HPI Sarah [name] is a 73 y.o. female with history of HTN, GERD, HLD, hypothyroidism presenting today for abdominal pain and vomiting.  Patient states on Saturday night she started having generalized abdominal pain which was followed with constipation and then vomiting.  The pain is lessened and mostly nonexistent at this point but still having ongoing vomiting.  Denies any diarrhea.  Having chills but no fevers.  Mild shortness of breath from her asthma but no chest pain.  Denies dysuria, leg pain, leg cramps.     Physical Exam   Triage Vital Signs: ED Triage Vitals  Encounter Vitals Group     BP 09/04/23 1640 115/61     Systolic BP Percentile --      Diastolic BP Percentile --      Pulse Rate 09/04/23 1640 (!) 101     Resp 09/04/23 1640 19     Temp 09/04/23 1640 98.1 F (36.7 C)     Temp src --      SpO2 09/04/23 1640 91 %     Weight 09/04/23 1638 184 lb (83.5 kg)     Height 09/04/23 1638 5\' 3"  (1.6 m)     Head Circumference --      Peak Flow --      Pain Score 09/04/23 1639 0     Pain Loc --      Pain Education --      Exclude from Growth Chart --     Most recent vital signs: Vitals:   09/04/23 1840 09/04/23 1900  BP:  (!) 124/49  Pulse: (!) 104 97  Resp: 17 19  Temp:    SpO2: (!) 88% 99%   Physical Exam: I have reviewed the vital signs and nursing notes. General: Awake, alert, no acute distress.  Nontoxic appearing. Head:  Atraumatic, normocephalic.   ENT:  EOM intact, PERRL. Oral mucosa is pink and moist with no lesions. Neck: Neck is supple with full range of motion, No meningeal signs. Cardiovascular:  RRR, No murmurs. Peripheral pulses palpable and equal bilaterally. Respiratory:  Symmetrical chest wall expansion.  No rhonchi, rales.  Slight end expiratory wheeze present in the bases.   Good air movement throughout.  No use of accessory muscles.   Musculoskeletal:  No cyanosis or edema. Moving extremities with full ROM Abdomen:  Soft, mild generalized TTP throughout, nondistended. Neuro:  GCS 15, moving all four extremities, interacting appropriately. Speech clear. Psych:  Calm, appropriate.   Skin:  Warm, dry, no rash.    ED Results / Procedures / Treatments   Labs (all labs ordered are listed, but only abnormal results are displayed) Labs Reviewed  COMPREHENSIVE METABOLIC PANEL WITH GFR - Abnormal; Notable for the following components:      Result Value   Glucose, Bld 128 (*)    Creatinine, Ser 1.02 (*)    GFR, Estimated 58 (*)    All other components within normal limits  CBC - Abnormal; Notable for the following components:   RBC 2.73 (*)    Hemoglobin 10.3 (*)    HCT 31.3 (*)    MCV 114.7 (*)    MCH 37.7 (*)    Platelets 106 (*)    All other components within normal limits  URINALYSIS, ROUTINE W REFLEX MICROSCOPIC - Abnormal; Notable  for the following components:   Color, Urine AMBER (*)    APPearance HAZY (*)    Ketones, ur 20 (*)    Protein, ur 100 (*)    Leukocytes,Ua SMALL (*)    Bacteria, UA RARE (*)    All other components within normal limits  RESP PANEL BY RT-PCR (RSV, FLU A&B, COVID)  RVPGX2  LIPASE, BLOOD     EKG    RADIOLOGY Independent interpreted chest x-ray and CT abdomen/pelvis with only acute finding of right middle lobe pneumonia   PROCEDURES:  Critical Care performed: Yes, see critical care procedure note(s)  .Critical Care  Performed by: Kandee Orion, MD Authorized by: Kandee Orion, MD   Critical care provider statement:    Critical care time (minutes):  30   Critical care was necessary to treat or prevent imminent or life-threatening deterioration of the following conditions:  Respiratory failure   Critical care was time spent personally by me on the following activities:  Development of treatment plan with  patient or surrogate, discussions with consultants, evaluation of patient's response to treatment, examination of patient, ordering and review of laboratory studies, ordering and review of radiographic studies, ordering and performing treatments and interventions, pulse oximetry, re-evaluation of patient's condition and review of old charts   I assumed direction of critical care for this patient from another provider in my specialty: no     Care discussed with: admitting provider      MEDICATIONS ORDERED IN ED: Medications  cefTRIAXone  (ROCEPHIN ) 1 g in sodium chloride  0.9 % 100 mL IVPB (has no administration in time range)  albuterol  (PROVENTIL ) (2.5 MG/3ML) 0.083% nebulizer solution 10 mg (has no administration in time range)  dexamethasone  (DECADRON ) injection 10 mg (has no administration in time range)  ipratropium-albuterol  (DUONEB) 0.5-2.5 (3) MG/3ML nebulizer solution 3 mL (3 mLs Nebulization Given 09/04/23 1811)  ondansetron  (ZOFRAN ) injection 4 mg (4 mg Intravenous Given 09/04/23 1811)  lactated ringers  bolus 1,000 mL (1,000 mLs Intravenous New Bag/Given 09/04/23 1811)  iohexol  (OMNIPAQUE ) 300 MG/ML solution 100 mL (100 mLs Intravenous Contrast Given 09/04/23 1825)     IMPRESSION / MDM / ASSESSMENT AND PLAN / ED COURSE  I reviewed the triage vital signs and the nursing notes.                              Differential diagnosis includes, but is not limited to, pneumonia, COVID, flu, RSV, colitis, enteritis, SBO, cystitis  Patient's presentation is most consistent with acute presentation with potential threat to life or bodily function.  Patient is a 73 year old female presenting today predominantly for shortness of breath with wheezing as well as vomiting.  Had onset of abdominal pain with vomiting 2 days ago.  Abdominal pain has resolved although slightly tender on the right side of her abdomen.  Vital signs show slight tachycardia but otherwise no tachypnea or hypoxia initially on  room air.  Patient was later found to be hypoxic to 88% requiring 2 L.  Initially did give DuoNeb as well as fluids and Zofran .  Felt she had some improvement with her wheezing.  CMP at her baseline.  CBC with no leukocytosis and lipase normal.  UA negative.  CT abdomen/pelvis shows no acute intra-abdominal pathology but was noted on that as well as chest x-ray to have a right middle lobe pneumonia.  Likely the source of her symptoms today.  Started on ceftriaxone  as she already taken  azithromycin  today.  Given Decadron  as well as additional albuterol  treatment.  Will admit to hospitalist for pneumonia with hypoxia.  The patient is on the cardiac monitor to evaluate for evidence of arrhythmia and/or significant heart rate changes.     FINAL CLINICAL IMPRESSION(S) / ED DIAGNOSES   Final diagnoses:  Pneumonia of right middle lobe due to infectious organism  Acute hypoxic respiratory failure (HCC)     Rx / DC Orders   ED Discharge Orders     None        Note:  This document was prepared using Dragon voice recognition software and may include unintentional dictation errors.   Kandee Orion, MD 09/04/23 2023

## 2023-09-04 NOTE — Assessment & Plan Note (Signed)
 CPAP nightly ordered

## 2023-09-04 NOTE — Assessment & Plan Note (Signed)
 Hydralazine 5 mg IV every 6 hours as needed for SBP greater than 165, 5 days ordered

## 2023-09-04 NOTE — Assessment & Plan Note (Signed)
 Rosuvastatin 10 mg daily resumed

## 2023-09-04 NOTE — Hospital Course (Addendum)
 Ms. Sarah [name] is a 69 female with hypertension, hypothyroid, hyperlipidemia, GERD, who presents emergency department for chief concerns of cough, wheezing, shortness of breath.  Vitals in the ED showed temperature of 98.1, respiration rate 19, heart rate 101, blood pressure 115/61, SpO2 91%.  Patient desatted to 88% on room air and was placed on 2 L nasal cannula.  Serum sodium is 136, potassium 3.6, chloride 101, bicarb 26, bun 14, sCr 1.02, nonfasting blood glucose is 128, wbc 10.2, hgb 10.3, platelets 106.   CT Abd/Pel w contrast: Very mild tree-in-bud changes in the right middle lobe which may represent acute infiltrate. Simple hepatic and renal cysts are noted. No follow-up is recommended.  CXR: Nonspecific opacities overlying the lateral aspect of the right lower lung zone these may represent focal pneumonitis versus Atelectasis  ED treatment: DuoNebs one-time dose, Decadron  10 mg IV one-time dose, albuterol  nebulizer, ondansetron  4 mg IV one-time dose, ceftriaxone  1 g IV, LR 1 L bolus.

## 2023-09-05 DIAGNOSIS — J189 Pneumonia, unspecified organism: Secondary | ICD-10-CM

## 2023-09-05 DIAGNOSIS — E039 Hypothyroidism, unspecified: Secondary | ICD-10-CM | POA: Diagnosis not present

## 2023-09-05 DIAGNOSIS — J9601 Acute respiratory failure with hypoxia: Secondary | ICD-10-CM | POA: Diagnosis not present

## 2023-09-05 DIAGNOSIS — F329 Major depressive disorder, single episode, unspecified: Secondary | ICD-10-CM | POA: Diagnosis not present

## 2023-09-05 LAB — BASIC METABOLIC PANEL WITH GFR
Anion gap: 9 (ref 5–15)
BUN: 17 mg/dL (ref 8–23)
CO2: 23 mmol/L (ref 22–32)
Calcium: 8.7 mg/dL — ABNORMAL LOW (ref 8.9–10.3)
Chloride: 101 mmol/L (ref 98–111)
Creatinine, Ser: 1.21 mg/dL — ABNORMAL HIGH (ref 0.44–1.00)
GFR, Estimated: 47 mL/min — ABNORMAL LOW (ref >60)
Glucose, Bld: 246 mg/dL — ABNORMAL HIGH (ref 70–99)
Potassium: 4 mmol/L (ref 3.5–5.1)
Sodium: 133 mmol/L — ABNORMAL LOW (ref 135–145)

## 2023-09-05 LAB — CBC
HCT: 28.1 % — ABNORMAL LOW (ref 36.0–46.0)
Hemoglobin: 9.5 g/dL — ABNORMAL LOW (ref 12.0–15.0)
MCH: 39.1 pg — ABNORMAL HIGH (ref 26.0–34.0)
MCHC: 33.8 g/dL (ref 30.0–36.0)
MCV: 115.6 fL — ABNORMAL HIGH (ref 80.0–100.0)
Platelets: 86 10*3/uL — ABNORMAL LOW (ref 150–400)
RBC: 2.43 MIL/uL — ABNORMAL LOW (ref 3.87–5.11)
RDW: 13.7 % (ref 11.5–15.5)
WBC: 10.6 10*3/uL — ABNORMAL HIGH (ref 4.0–10.5)
nRBC: 0 % (ref 0.0–0.2)

## 2023-09-05 MED ORDER — IPRATROPIUM-ALBUTEROL 0.5-2.5 (3) MG/3ML IN SOLN
3.0000 mL | Freq: Four times a day (QID) | RESPIRATORY_TRACT | Status: DC | PRN
  Filled 2023-09-05: qty 3

## 2023-09-05 MED ORDER — SENNOSIDES-DOCUSATE SODIUM 8.6-50 MG PO TABS
2.0000 | ORAL_TABLET | Freq: Two times a day (BID) | ORAL | Status: DC
  Administered 2023-09-05 – 2023-09-06 (×4): 2 via ORAL
  Filled 2023-09-05 (×6): qty 2

## 2023-09-05 MED ORDER — METHYLPREDNISOLONE SODIUM SUCC 125 MG IJ SOLR
60.0000 mg | INTRAMUSCULAR | Status: DC
  Administered 2023-09-05: 60 mg via INTRAVENOUS
  Filled 2023-09-05: qty 2

## 2023-09-05 MED ORDER — POLYETHYLENE GLYCOL 3350 17 G PO PACK
17.0000 g | PACK | Freq: Two times a day (BID) | ORAL | Status: DC
  Administered 2023-09-05 – 2023-09-06 (×4): 17 g via ORAL
  Filled 2023-09-05 (×5): qty 1

## 2023-09-05 NOTE — Plan of Care (Signed)
 Patient educated on the importance of eating healthier and drinking water verse sodas. Patient acknowledges she does not like water at all and preferrs her sprite and ginger ale. Problem: Education: Goal: Knowledge of General Education information will improve Description: Including pain rating scale, medication(s)/side effects and non-pharmacologic comfort measures Outcome: Progressing   Problem: Activity: Goal: Risk for activity intolerance will decrease Outcome: Progressing   Problem: Nutrition: Goal: Adequate nutrition will be maintained Outcome: Progressing

## 2023-09-05 NOTE — Progress Notes (Signed)
 1      PROGRESS NOTE    Sarah [name]  [id number]:[id number] DOB: 30-Aug-1950 DOA: 09/04/2023 PCP: Thersia Flax, MD    Brief Narrative:   6 female with hypertension, hypothyroid, hyperlipidemia, GERD, who presents emergency department for chief concerns of cough, wheezing, shortness of breath   5/13: Started Senokot and MiraLAX , progressive mobility, wean oxygen   Assessment & Plan:   Principal Problem:   Acute hypoxic respiratory failure (HCC) Active Problems:   Right middle lobe pneumonia   Depression   Hyperlipidemia   Hypothyroid   Essential hypertension   Thrombocytopenia (HCC)   Chronic GERD   OSA (obstructive sleep apnea)   * Acute hypoxic respiratory failure (HCC) Suspect secondary to right middle lobe pneumonia seen on CT chest Continue ceftriaxone  2 g IV daily, azithromycin  500 mg IV daily to complete a 5-day course Guaifenesin  as needed to loosen phlegm Tussionex nightly as needed for cough, 2 doses ordered DuoNebs as needed every 6 hours Will add IV Solu-Medrol  as she is wheezing   Right middle lobe pneumonia Azithromycin  500 mg IV daily, ceftriaxone  2 g IV daily, 5 days    Essential hypertension Continue amlodipine .  As needed hydralazine     Hypothyroid Levothyroxine  50 mcg daily    Hyperlipidemia Rosuvastatin  10 mg daily    Depression Continue citalopram  20 mg daily and trazodone  25 to 50 mg nightly as needed for sleep    OSA (obstructive sleep apnea) CPAP nightly   Chronic GERD Home PPI   Constipation Last BM on 5/10 Start Senokot as and MiraLAX    DVT prophylaxis:  heparin  injection 5,000 Units Start: 09/04/23 2200 Place TED hose Start: 09/04/23 2029     Code Status: Full code Family Communication: None at bedside Disposition Plan: Possible discharge in next 1 to 2 days depending on clinical condition     Antimicrobials:  Rocephin  Zithromax    Subjective:  Breathing and cough slowly improving.  She has not had any vomiting  while here  Objective: Vitals:   09/04/23 2308 09/05/23 0428 09/05/23 0754 09/05/23 1132  BP: (!) 110/54 (!) 116/59 (!) 114/59   Pulse: (!) 105 81 74   Resp: 18 18 16    Temp: 98.5 F (36.9 C) 97.6 F (36.4 C) 98.2 F (36.8 C)   TempSrc: Oral Oral Oral   SpO2: 95% 94% 92% 97%  Weight:      Height:        Intake/Output Summary (Last 24 hours) at 09/05/2023 1247 Last data filed at 09/05/2023 1034 Gross per 24 hour  Intake 612.25 ml  Output --  Net 612.25 ml   Filed Weights   09/04/23 1638  Weight: 83.5 kg    Examination:  General exam: Appears calm and comfortable  Respiratory system: Mild expiratory wheezing throughout both lungs Cardiovascular system: S1 & S2 heard, RRR. No pedal edema. Gastrointestinal system: Abdomen is soft, benign Central nervous system: Alert and oriented. No focal neurological deficits. Extremities: Symmetric 5 x 5 power. Skin: No rashes, lesions or ulcers Psychiatry: Judgement and insight appear normal. Mood & affect appropriate.     Data Reviewed: I have personally reviewed following labs and imaging studies  CBC: Recent Labs  Lab 09/04/23 1640 09/05/23 0539  WBC 10.2 10.6*  HGB 10.3* 9.5*  HCT 31.3* 28.1*  MCV 114.7* 115.6*  PLT 106* 86*   Basic Metabolic Panel: Recent Labs  Lab 09/04/23 1640 09/05/23 0539  NA 136 133*  K 3.6 4.0  CL 101 101  CO2  26 23  GLUCOSE 128* 246*  BUN 14 17  CREATININE 1.02* 1.21*  CALCIUM  9.2 8.7*   GFR: Estimated Creatinine Clearance: 41.7 mL/min (A) (by C-G formula based on SCr of 1.21 mg/dL (H)). Liver Function Tests: Recent Labs  Lab 09/04/23 1640  AST 17  ALT 17  ALKPHOS 66  BILITOT 0.9  PROT 7.2  ALBUMIN 4.1   Recent Labs  Lab 09/04/23 1640  LIPASE 22    Recent Results (from the past 240 hours)  Resp panel by RT-PCR (RSV, Flu A&B, Covid) Anterior Nasal Swab     Status: None   Collection Time: 09/04/23  6:38 PM   Specimen: Anterior Nasal Swab  Result Value Ref Range  Status   SARS Coronavirus 2 by RT PCR NEGATIVE NEGATIVE Final    Comment: (NOTE) SARS-CoV-2 target nucleic acids are NOT DETECTED.  The SARS-CoV-2 RNA is generally detectable in upper respiratory specimens during the acute phase of infection. The lowest concentration of SARS-CoV-2 viral copies this assay can detect is 138 copies/mL. A negative result does not preclude SARS-Cov-2 infection and should not be used as the sole basis for treatment or other patient management decisions. A negative result may occur with  improper specimen collection/handling, submission of specimen other than nasopharyngeal swab, presence of viral mutation(s) within the areas targeted by this assay, and inadequate number of viral copies(<138 copies/mL). A negative result must be combined with clinical observations, patient history, and epidemiological information. The expected result is Negative.  Fact Sheet for Patients:  BloggerCourse.com  Fact Sheet for Healthcare Providers:  SeriousBroker.it  This test is no t yet approved or cleared by the [place]  FDA and  has been authorized for detection and/or diagnosis of SARS-CoV-2 by FDA under an Emergency Use Authorization (EUA). This EUA will remain  in effect (meaning this test can be used) for the duration of the COVID-19 declaration under Section 564(b)(1) of the Act, 21 U.S.C.section 360bbb-3(b)(1), unless the authorization is terminated  or revoked sooner.       Influenza A by PCR NEGATIVE NEGATIVE Final   Influenza B by PCR NEGATIVE NEGATIVE Final    Comment: (NOTE) The Xpert Xpress SARS-CoV-2/FLU/RSV plus assay is intended as an aid in the diagnosis of influenza from Nasopharyngeal swab specimens and should not be used as a sole basis for treatment. Nasal washings and aspirates are unacceptable for Xpert Xpress SARS-CoV-2/FLU/RSV testing.  Fact Sheet for  Patients: BloggerCourse.com  Fact Sheet for Healthcare Providers: SeriousBroker.it  This test is not yet approved or cleared by the [place]  FDA and has been authorized for detection and/or diagnosis of SARS-CoV-2 by FDA under an Emergency Use Authorization (EUA). This EUA will remain in effect (meaning this test can be used) for the duration of the COVID-19 declaration under Section 564(b)(1) of the Act, 21 U.S.C. section 360bbb-3(b)(1), unless the authorization is terminated or revoked.     Resp Syncytial Virus by PCR NEGATIVE NEGATIVE Final    Comment: (NOTE) Fact Sheet for Patients: BloggerCourse.com  Fact Sheet for Healthcare Providers: SeriousBroker.it  This test is not yet approved or cleared by the [place]  FDA and has been authorized for detection and/or diagnosis of SARS-CoV-2 by FDA under an Emergency Use Authorization (EUA). This EUA will remain in effect (meaning this test can be used) for the duration of the COVID-19 declaration under Section 564(b)(1) of the Act, 21 U.S.C. section 360bbb-3(b)(1), unless the authorization is terminated or revoked.  Performed at [facility], [address] [place], [place] [address]          Radiology Studies: CT ABDOMEN PELVIS W CONTRAST Result Date: 09/04/2023 CLINICAL DATA:  Generalized abdominal pain on the right for 2 days, initial encounter EXAM: CT ABDOMEN AND PELVIS WITH CONTRAST TECHNIQUE: Multidetector CT imaging of the abdomen and pelvis was performed using the standard protocol following bolus administration of intravenous contrast. RADIATION DOSE REDUCTION: This exam was performed according to the departmental dose-optimization program which includes automated exposure control, adjustment of the mA and/or kV according to patient size and/or use of iterative reconstruction technique. CONTRAST:   OMNIPAQUE  IOHEXOL  300 MG/ML  SOLN COMPARISON:  None Available. FINDINGS: Lower chest: Lung bases demonstrate some minimal tree-in-bud changes in the right mid of uncertain chronicity. Hepatobiliary: Simple cyst is noted in the left hepatic lobe. No other hepatic abnormality is noted. The gallbladder is within normal limits. Pancreas: Unremarkable. No pancreatic ductal dilatation or surrounding inflammatory changes. Spleen: Normal in size without focal abnormality. Adrenals/Urinary Tract: Adrenal glands are within normal limits. Kidneys are well visualized bilaterally. Multiple parapelvic renal cysts are noted bilaterally. No renal calculi or obstructive changes are seen. Normal excretion is noted on delayed images. Bladder is decompressed. Stomach/Bowel: Colon shows scattered fecal material throughout without obstructive change. The appendix is not well visualized. No inflammatory changes to suggest appendicitis are seen. Small bowel and stomach are unremarkable. Vascular/Lymphatic: Aortic atherosclerosis. No enlarged abdominal or pelvic lymph nodes. Reproductive: Status post hysterectomy. No adnexal masses. Other: No abdominal wall hernia or abnormality. No abdominopelvic ascites. Musculoskeletal: No acute or significant osseous findings. IMPRESSION: Very mild tree-in-bud changes in the right middle lobe which may represent acute infiltrate. Simple hepatic and renal cysts are noted. No follow-up is recommended. No acute abnormality to correspond with the given clinical history is seen. Electronically Signed   By: Violeta Grey M.D.   On: 09/04/2023 19:34   DG Chest 2 View Result Date: 09/04/2023 CLINICAL DATA:  Cough. EXAM: CHEST - 2 VIEW COMPARISON:  08/15/2022. FINDINGS: There are nonspecific opacities seen overlying the lateral aspect of the right lower lung zone. Bilateral lung fields are otherwise clear. No dense consolidation or lung collapse. Bilateral costophrenic angles are clear. Normal  cardio-mediastinal silhouette. No acute osseous abnormalities. Lower cervical spinal fixation hardware noted. The soft tissues are within normal limits. IMPRESSION: *Nonspecific opacities overlying the lateral aspect of the right lower lung zone these may represent focal pneumonitis versus atelectasis. Follow-up to clearing is recommended. Electronically Signed   By: Beula Brunswick M.D.   On: 09/04/2023 17:33        Scheduled Meds:  amLODipine   5 mg Oral Daily   budeson-glycopyrrolate -formoterol  2 puff Inhalation BID   citalopram   20 mg Oral Daily   ferrous sulfate   648 mg Oral Q breakfast   gabapentin   100 mg Oral TID   heparin   5,000 Units Subcutaneous Q8H   levothyroxine   50 mcg Oral Q0600   pantoprazole   40 mg Oral Daily   polyethylene glycol  17 g Oral BID   rosuvastatin   10 mg Oral Daily   senna-docusate  2 tablet Oral BID   Continuous Infusions:  azithromycin  500 mg (09/04/23 2144)   cefTRIAXone  (ROCEPHIN )  IV       LOS: 1 day    Time spent: 35 minutes    Brenna Cam, MD [facility] Pager [phone]  If 7PM-7AM, please contact night-coverage [email or web address]  09/05/2023, 12:47 PM

## 2023-09-05 NOTE — Plan of Care (Signed)

## 2023-09-06 ENCOUNTER — Inpatient Hospital Stay

## 2023-09-06 DIAGNOSIS — J9601 Acute respiratory failure with hypoxia: Secondary | ICD-10-CM | POA: Diagnosis not present

## 2023-09-06 LAB — BASIC METABOLIC PANEL WITH GFR
Anion gap: 9 (ref 5–15)
BUN: 21 mg/dL (ref 8–23)
CO2: 25 mmol/L (ref 22–32)
Calcium: 8.6 mg/dL — ABNORMAL LOW (ref 8.9–10.3)
Chloride: 104 mmol/L (ref 98–111)
Creatinine, Ser: 0.91 mg/dL (ref 0.44–1.00)
GFR, Estimated: >60 mL/min (ref >60)
Glucose, Bld: 183 mg/dL — ABNORMAL HIGH (ref 70–99)
Potassium: 4.2 mmol/L (ref 3.5–5.1)
Sodium: 138 mmol/L (ref 135–145)

## 2023-09-06 LAB — CBC
HCT: 27.6 % — ABNORMAL LOW (ref 36.0–46.0)
Hemoglobin: 9.1 g/dL — ABNORMAL LOW (ref 12.0–15.0)
MCH: 38.1 pg — ABNORMAL HIGH (ref 26.0–34.0)
MCHC: 33 g/dL (ref 30.0–36.0)
MCV: 115.5 fL — ABNORMAL HIGH (ref 80.0–100.0)
Platelets: 94 10*3/uL — ABNORMAL LOW (ref 150–400)
RBC: 2.39 MIL/uL — ABNORMAL LOW (ref 3.87–5.11)
RDW: 13.9 % (ref 11.5–15.5)
WBC: 14.8 10*3/uL — ABNORMAL HIGH (ref 4.0–10.5)
nRBC: 0 % (ref 0.0–0.2)

## 2023-09-06 MED ORDER — AZITHROMYCIN 250 MG PO TABS
250.0000 mg | ORAL_TABLET | Freq: Every day | ORAL | Status: AC
  Administered 2023-09-06 – 2023-09-08 (×3): 250 mg via ORAL
  Filled 2023-09-06 (×3): qty 1

## 2023-09-06 MED ORDER — IPRATROPIUM-ALBUTEROL 0.5-2.5 (3) MG/3ML IN SOLN
3.0000 mL | Freq: Four times a day (QID) | RESPIRATORY_TRACT | Status: DC
  Administered 2023-09-06 – 2023-09-07 (×3): 3 mL via RESPIRATORY_TRACT
  Filled 2023-09-06 (×3): qty 3

## 2023-09-06 MED ORDER — AMOXICILLIN-POT CLAVULANATE 875-125 MG PO TABS
1.0000 | ORAL_TABLET | Freq: Two times a day (BID) | ORAL | Status: DC
  Administered 2023-09-06 – 2023-09-09 (×7): 1 via ORAL
  Filled 2023-09-06 (×7): qty 1

## 2023-09-06 MED ORDER — FUROSEMIDE 10 MG/ML IJ SOLN: 40.0000 mg | Freq: Once | INTRAMUSCULAR | Status: DC

## 2023-09-06 MED ORDER — FUROSEMIDE 40 MG PO TABS
60.0000 mg | ORAL_TABLET | Freq: Once | ORAL | Status: AC
  Administered 2023-09-06: 60 mg via ORAL
  Filled 2023-09-06: qty 1

## 2023-09-06 MED ORDER — ARFORMOTEROL TARTRATE 15 MCG/2ML IN NEBU
15.0000 ug | INHALATION_SOLUTION | Freq: Two times a day (BID) | RESPIRATORY_TRACT | Status: DC
  Administered 2023-09-06 – 2023-09-09 (×7): 15 ug via RESPIRATORY_TRACT
  Filled 2023-09-06 (×7): qty 2

## 2023-09-06 MED ORDER — BUDESONIDE 0.25 MG/2ML IN SUSP
0.2500 mg | Freq: Two times a day (BID) | RESPIRATORY_TRACT | Status: DC
  Administered 2023-09-06 – 2023-09-09 (×7): 0.25 mg via RESPIRATORY_TRACT
  Filled 2023-09-06 (×7): qty 2

## 2023-09-06 MED ORDER — PREDNISONE 20 MG PO TABS
40.0000 mg | ORAL_TABLET | Freq: Every day | ORAL | Status: DC
  Administered 2023-09-07: 40 mg via ORAL
  Filled 2023-09-06: qty 2

## 2023-09-06 NOTE — Progress Notes (Signed)
 PROGRESS NOTE    Sarah [name]  WUJ:[id number] DOB: 12/17/1950 DOA: 09/04/2023 PCP: Thersia Flax, MD    Brief Narrative:   71 female with hypertension, hypothyroid, hyperlipidemia, GERD, who presents emergency department for chief concerns of cough, wheezing, shortness of breath    5/13: Started Senokot and MiraLAX , progressive mobility, wean oxygen 5/14: Remains on 2 L nasal cannula  Assessment & Plan:   Principal Problem:   Acute hypoxic respiratory failure (HCC) Active Problems:   Right middle lobe pneumonia   Depression   Hyperlipidemia   Hypothyroid   Essential hypertension   Thrombocytopenia (HCC)   Chronic GERD   OSA (obstructive sleep apnea)   Acute hypoxic respiratory failure (HCC) Suspect secondary to right middle lobe pneumonia seen on CT chest Patient was on Rocephin  and azithromycin  intravenously however was experiencing pain from her IV site now refusing to have new one placed. Plan: P.o. prednisone  40 mg daily P.o. Augmentin  P.o. azithromycin  As needed mucolytic's and antitussives Bronchodilators   Right middle lobe pneumonia 5-day course of Augmentin  and azithromycin    Essential hypertension Continue amlodipine .  As needed hydralazine     Hypothyroid Levothyroxine  50 mcg daily    Hyperlipidemia Rosuvastatin  10 mg daily    Depression Celexa  20 mg daily Trazodone  25 to 50 mg nightly as needed sleep   OSA (obstructive sleep apnea) CPAP nightly   Chronic GERD Home PPI    Constipation Last BM on 5/10 Start Senokot as and MiraLAX    DVT prophylaxis: SQ heparin  Code Status: Full Family Communication: None Disposition Plan: Status is: Inpatient Remains inpatient appropriate because: Acute respiratory failure secondary to community-acquired pneumonia on IV antibiotics   Level of care: Telemetry Medical  Consultants:  None  Procedures:  None  Antimicrobials: Augmentin  Azithromycin    Subjective: Seen and examined.  Resting  in bed.  Continues to report shortness of breath, cough Objective: Vitals:   09/05/23 2025 09/06/23 0249 09/06/23 0347 09/06/23 0730  BP: 128/62 (!) 116/56 (!) 111/54 118/64  Pulse: 85 72 76 83  Resp: 18 17 18 20   Temp: 97.9 F (36.6 C)  98.1 F (36.7 C) 98.4 F (36.9 C)  TempSrc: Oral     SpO2: 93% 93% 92% 94%  Weight:      Height:        Intake/Output Summary (Last 24 hours) at 09/06/2023 1315 Last data filed at 09/05/2023 1413 Gross per 24 hour  Intake 240 ml  Output --  Net 240 ml   Filed Weights   09/04/23 1638  Weight: 83.5 kg    Examination:  General exam: Appears calm and comfortable  Respiratory system: Scattered coarse crackles bilaterally.  End expiratory wheeze.  Normal work of breathing.  2 L Cardiovascular system: S1-S2, RRR, no murmurs, no pedal edema Gastrointestinal system: Soft, NT/ND, normal bowel sounds Central nervous system: Alert and oriented. No focal neurological deficits. Extremities: Symmetric 5 x 5 power. Skin: No rashes, lesions or ulcers Psychiatry: Judgement and insight appear normal. Mood & affect appropriate.     Data Reviewed: I have personally reviewed following labs and imaging studies  CBC: Recent Labs  Lab 09/04/23 1640 09/05/23 0539 09/06/23 0242  WBC 10.2 10.6* 14.8*  HGB 10.3* 9.5* 9.1*  HCT 31.3* 28.1* 27.6*  MCV 114.7* 115.6* 115.5*  PLT 106* 86* 94*   Basic Metabolic Panel: Recent Labs  Lab 09/04/23 1640 09/05/23 0539 09/06/23 0242  NA 136 133* 138  K 3.6 4.0 4.2  CL 101 101 104  CO2 26  23 25  GLUCOSE 128* 246* 183*  BUN 14 17 21   CREATININE 1.02* 1.21* 0.91  CALCIUM  9.2 8.7* 8.6*   GFR: Estimated Creatinine Clearance: 55.5 mL/min (by C-G formula based on SCr of 0.91 mg/dL). Liver Function Tests: Recent Labs  Lab 09/04/23 1640  AST 17  ALT 17  ALKPHOS 66  BILITOT 0.9  PROT 7.2  ALBUMIN 4.1   Recent Labs  Lab 09/04/23 1640  LIPASE 22   No results for input(s): "AMMONIA" in the last 168  hours. Coagulation Profile: No results for input(s): "INR", "PROTIME" in the last 168 hours. Cardiac Enzymes: No results for input(s): "CKTOTAL", "CKMB", "CKMBINDEX", "TROPONINI" in the last 168 hours. BNP (last 3 results) No results for input(s): "PROBNP" in the last 8760 hours. HbA1C: No results for input(s): "HGBA1C" in the last 72 hours. CBG: No results for input(s): "GLUCAP" in the last 168 hours. Lipid Profile: No results for input(s): "CHOL", "HDL", "LDLCALC", "TRIG", "CHOLHDL", "LDLDIRECT" in the last 72 hours. Thyroid  Function Tests: No results for input(s): "TSH", "T4TOTAL", "FREET4", "T3FREE", "THYROIDAB" in the last 72 hours. Anemia Panel: No results for input(s): "VITAMINB12", "FOLATE", "FERRITIN", "TIBC", "IRON", "RETICCTPCT" in the last 72 hours. Sepsis Labs: No results for input(s): "PROCALCITON", "LATICACIDVEN" in the last 168 hours.  Recent Results (from the past 240 hours)  Resp panel by RT-PCR (RSV, Flu A&B, Covid) Anterior Nasal Swab     Status: None   Collection Time: 09/04/23  6:38 PM   Specimen: Anterior Nasal Swab  Result Value Ref Range Status   SARS Coronavirus 2 by RT PCR NEGATIVE NEGATIVE Final    Comment: (NOTE) SARS-CoV-2 target nucleic acids are NOT DETECTED.  The SARS-CoV-2 RNA is generally detectable in upper respiratory specimens during the acute phase of infection. The lowest concentration of SARS-CoV-2 viral copies this assay can detect is 138 copies/mL. A negative result does not preclude SARS-Cov-2 infection and should not be used as the sole basis for treatment or other patient management decisions. A negative result may occur with  improper specimen collection/handling, submission of specimen other than nasopharyngeal swab, presence of viral mutation(s) within the areas targeted by this assay, and inadequate number of viral copies(<138 copies/mL). A negative result must be combined with clinical observations, patient history, and  epidemiological information. The expected result is Negative.  Fact Sheet for Patients:  BloggerCourse.com  Fact Sheet for Healthcare Providers:  SeriousBroker.it  This test is no t yet approved or cleared by the [place]  FDA and  has been authorized for detection and/or diagnosis of SARS-CoV-2 by FDA under an Emergency Use Authorization (EUA). This EUA will remain  in effect (meaning this test can be used) for the duration of the COVID-19 declaration under Section 564(b)(1) of the Act, 21 U.S.C.section 360bbb-3(b)(1), unless the authorization is terminated  or revoked sooner.       Influenza A by PCR NEGATIVE NEGATIVE Final   Influenza B by PCR NEGATIVE NEGATIVE Final    Comment: (NOTE) The Xpert Xpress SARS-CoV-2/FLU/RSV plus assay is intended as an aid in the diagnosis of influenza from Nasopharyngeal swab specimens and should not be used as a sole basis for treatment. Nasal washings and aspirates are unacceptable for Xpert Xpress SARS-CoV-2/FLU/RSV testing.  Fact Sheet for Patients: BloggerCourse.com  Fact Sheet for Healthcare Providers: SeriousBroker.it  This test is not yet approved or cleared by the [place]  FDA and has been authorized for detection and/or diagnosis of SARS-CoV-2 by FDA under an Emergency Use Authorization (EUA). This EUA  will remain in effect (meaning this test can be used) for the duration of the COVID-19 declaration under Section 564(b)(1) of the Act, 21 U.S.C. section 360bbb-3(b)(1), unless the authorization is terminated or revoked.     Resp Syncytial Virus by PCR NEGATIVE NEGATIVE Final    Comment: (NOTE) Fact Sheet for Patients: BloggerCourse.com  Fact Sheet for Healthcare Providers: SeriousBroker.it  This test is not yet approved or cleared by the [place]  FDA and has been  authorized for detection and/or diagnosis of SARS-CoV-2 by FDA under an Emergency Use Authorization (EUA). This EUA will remain in effect (meaning this test can be used) for the duration of the COVID-19 declaration under Section 564(b)(1) of the Act, 21 U.S.C. section 360bbb-3(b)(1), unless the authorization is terminated or revoked.  Performed at [facility], [address] [place], [place] [address]          Radiology Studies: CT ABDOMEN PELVIS W CONTRAST Result Date: 09/04/2023 CLINICAL DATA:  Generalized abdominal pain on the right for 2 days, initial encounter EXAM: CT ABDOMEN AND PELVIS WITH CONTRAST TECHNIQUE: Multidetector CT imaging of the abdomen and pelvis was performed using the standard protocol following bolus administration of intravenous contrast. RADIATION DOSE REDUCTION: This exam was performed according to the departmental dose-optimization program which includes automated exposure control, adjustment of the mA and/or kV according to patient size and/or use of iterative reconstruction technique. CONTRAST:  OMNIPAQUE  IOHEXOL  300 MG/ML  SOLN COMPARISON:  None Available. FINDINGS: Lower chest: Lung bases demonstrate some minimal tree-in-bud changes in the right mid of uncertain chronicity. Hepatobiliary: Simple cyst is noted in the left hepatic lobe. No other hepatic abnormality is noted. The gallbladder is within normal limits. Pancreas: Unremarkable. No pancreatic ductal dilatation or surrounding inflammatory changes. Spleen: Normal in size without focal abnormality. Adrenals/Urinary Tract: Adrenal glands are within normal limits. Kidneys are well visualized bilaterally. Multiple parapelvic renal cysts are noted bilaterally. No renal calculi or obstructive changes are seen. Normal excretion is noted on delayed images. Bladder is decompressed. Stomach/Bowel: Colon shows scattered fecal material throughout without obstructive change. The appendix is not well  visualized. No inflammatory changes to suggest appendicitis are seen. Small bowel and stomach are unremarkable. Vascular/Lymphatic: Aortic atherosclerosis. No enlarged abdominal or pelvic lymph nodes. Reproductive: Status post hysterectomy. No adnexal masses. Other: No abdominal wall hernia or abnormality. No abdominopelvic ascites. Musculoskeletal: No acute or significant osseous findings. IMPRESSION: Very mild tree-in-bud changes in the right middle lobe which may represent acute infiltrate. Simple hepatic and renal cysts are noted. No follow-up is recommended. No acute abnormality to correspond with the given clinical history is seen. Electronically Signed   By: Violeta Grey M.D.   On: 09/04/2023 19:34   DG Chest 2 View Result Date: 09/04/2023 CLINICAL DATA:  Cough. EXAM: CHEST - 2 VIEW COMPARISON:  08/15/2022. FINDINGS: There are nonspecific opacities seen overlying the lateral aspect of the right lower lung zone. Bilateral lung fields are otherwise clear. No dense consolidation or lung collapse. Bilateral costophrenic angles are clear. Normal cardio-mediastinal silhouette. No acute osseous abnormalities. Lower cervical spinal fixation hardware noted. The soft tissues are within normal limits. IMPRESSION: *Nonspecific opacities overlying the lateral aspect of the right lower lung zone these may represent focal pneumonitis versus atelectasis. Follow-up to clearing is recommended. Electronically Signed   By: Beula Brunswick M.D.   On: 09/04/2023 17:33        Scheduled Meds:  amLODipine   5 mg Oral Daily   amoxicillin -clavulanate  1 tablet Oral  Q12H   arformoterol  15 mcg Nebulization BID   azithromycin   250 mg Oral Daily   budesonide  (PULMICORT ) nebulizer solution  0.25 mg Nebulization BID   citalopram   20 mg Oral Daily   ferrous sulfate   648 mg Oral Q breakfast   gabapentin   100 mg Oral TID   heparin   5,000 Units Subcutaneous Q8H   ipratropium-albuterol   3 mL Nebulization Q6H   levothyroxine    50 mcg Oral Q0600   pantoprazole   40 mg Oral Daily   polyethylene glycol  17 g Oral BID   [START ON 09/07/2023] predniSONE   40 mg Oral Q breakfast   rosuvastatin   10 mg Oral Daily   senna-docusate  2 tablet Oral BID   Continuous Infusions:   LOS: 2 days    Tiajuana Fluke, MD [facility]   If 7PM-7AM, please contact night-coverage  09/06/2023, 1:15 PM

## 2023-09-06 NOTE — TOC Initial Note (Signed)
 Transition of Care [facility]) - Initial/Assessment Note    Patient Details  Name: Sarah [name] MRN: [id number] Date of Birth: 1951-04-16  Transition of Care [facility]) CM/SW Contact:    Alexandra Ice, RN Phone Number: 09/06/2023, 3:41 PM  Clinical Narrative:                 Met with patient. She is currently on oxygen, does not use at  home. She does have CPAP at home. TOC to monitor for discharge needs.   Expected Discharge Plan: Home/Self Care Barriers to Discharge: Continued Medical Work up   Patient Goals and CMS Choice Patient states their goals for this hospitalization and ongoing recovery are:: feel better          Expected Discharge Plan and Services       Living arrangements for the past 2 months: Single Family Home                   DME Agency: NA       HH Arranged: NA          Prior Living Arrangements/Services Living arrangements for the past 2 months: Single Family Home Lives with:: Roommate Patient language and need for interpreter reviewed:: Yes Do you feel safe going back to the place where you live?: Yes      Need for Family Participation in Patient Care: No (Comment) Care giver support system in place?: No (comment) Current home services: DME (CPAP) Criminal Activity/Legal Involvement Pertinent to Current Situation/Hospitalization: No - Comment as needed  Activities of Daily Living   ADL Screening (condition at time of admission) Independently performs ADLs?: Yes (appropriate for developmental age) Is the patient deaf or have difficulty hearing?: Yes Does the patient have difficulty seeing, even when wearing glasses/contacts?: No Does the patient have difficulty concentrating, remembering, or making decisions?: No  Permission Sought/Granted                  Emotional Assessment Appearance:: Appears stated age Attitude/Demeanor/Rapport: Engaged Affect (typically observed): Accepting Orientation: : Oriented to Self, Oriented to  Place, Oriented to  Time, Oriented to Situation Alcohol / Substance Use: Not Applicable Psych Involvement: No (comment)  Admission diagnosis:  Pneumonia of right middle lobe due to infectious organism [J18.9] Acute hypoxic respiratory failure (HCC) [J96.01] Patient Active Problem List   Diagnosis Date Noted   Acute hypoxic respiratory failure (HCC) 09/04/2023   Right middle lobe pneumonia 09/04/2023   History of cystitis 05/07/2023   Coronary artery disease due to calcified coronary lesion 05/05/2023   Persistent asthma with acute exacerbation 05/05/2023   Bilateral lower extremity edema 12/02/2022   OSA (obstructive sleep apnea) 09/06/2022   Hyperglycemia 08/15/2022   Snoring 06/03/2022   Hypersomnia 05/18/2022   Suprapubic cramping 05/18/2022   Overactive bladder 05/18/2022   MDS (myelodysplastic syndrome), low grade (HCC) 04/20/2022   Tubular adenoma of colon 02/07/2022   Cervical myelopathy (HCC) 11/10/2021   Spinal stenosis, cervical region 09/15/2021   Adenomatous polyp of transverse colon    Polyp of colon    Thrombocytopenia (HCC) 01/13/2021   Essential hypertension 12/20/2020   Macrocytic anemia 12/18/2020   Allergic rhinitis 12/13/2020   Fatigue 12/13/2020   Insomnia 12/13/2020   Grief reaction 08/09/2020   Cognitive complaints 08/09/2020   Neuropathy 06/14/2020   Educated about COVID-19 virus infection 04/10/2019   Prediabetes 10/13/2018   LVH (left ventricular hypertrophy) due to hypertensive disease, without heart failure 10/13/2018   Hospital discharge follow-up 07/08/2018  Hypothyroid 06/15/2017   Post herpetic neuralgia 06/06/2017   Encounter for preventive health examination 09/09/2014   Chronic GERD 09/09/2014   Hyperlipidemia 08/17/2014   S/P hysterectomy 04/28/2013   Initial Medicare annual wellness visit 04/28/2013   Depression    PCP:  Thersia Flax, MD Pharmacy:   [facility] [address], [place] - [address] [place] [address] Phone: [phone] Fax: [phone]  [facility] [place], [place] - [address] [place] [address] Phone: [phone] Fax: [phone]     Social Drivers of Health (SDOH) Social History: SDOH Screenings   Food Insecurity: No Food Insecurity (09/05/2023)  Housing: Low Risk  (09/05/2023)  Transportation Needs: No Transportation Needs (09/05/2023)  Utilities: Not At Risk (09/05/2023)  Alcohol Screen: Low Risk  (05/15/2023)  Depression (PHQ2-9): Medium Risk (05/15/2023)  Financial Resource Strain: Low Risk  (05/15/2023)  Physical Activity: Inactive (05/15/2023)  Social Connections: Moderately Integrated (09/05/2023)  Stress: No Stress Concern Present (05/15/2023)  Tobacco Use: Low Risk  (09/04/2023)  Health Literacy: Adequate Health Literacy (05/15/2023)   SDOH Interventions:     Readmission Risk Interventions     No data to display

## 2023-09-07 ENCOUNTER — Inpatient Hospital Stay

## 2023-09-07 DIAGNOSIS — J9601 Acute respiratory failure with hypoxia: Secondary | ICD-10-CM | POA: Diagnosis not present

## 2023-09-07 LAB — CBC WITH DIFFERENTIAL/PLATELET
Abs Immature Granulocytes: 0.41 10*3/uL — ABNORMAL HIGH (ref 0.00–0.07)
Basophils Absolute: 0.1 10*3/uL (ref 0.0–0.1)
Basophils Relative: 1 %
Eosinophils Absolute: 0.1 10*3/uL (ref 0.0–0.5)
Eosinophils Relative: 1 %
HCT: 26.9 % — ABNORMAL LOW (ref 36.0–46.0)
Hemoglobin: 9 g/dL — ABNORMAL LOW (ref 12.0–15.0)
Immature Granulocytes: 5 %
Lymphocytes Relative: 20 %
Lymphs Abs: 1.8 10*3/uL (ref 0.7–4.0)
MCH: 38.1 pg — ABNORMAL HIGH (ref 26.0–34.0)
MCHC: 33.5 g/dL (ref 30.0–36.0)
MCV: 114 fL — ABNORMAL HIGH (ref 80.0–100.0)
Monocytes Absolute: 0.8 10*3/uL (ref 0.1–1.0)
Monocytes Relative: 9 %
Neutro Abs: 5.9 10*3/uL (ref 1.7–7.7)
Neutrophils Relative %: 64 %
Platelets: 93 10*3/uL — ABNORMAL LOW (ref 150–400)
RBC: 2.36 MIL/uL — ABNORMAL LOW (ref 3.87–5.11)
RDW: 14.2 % (ref 11.5–15.5)
Smear Review: NORMAL
WBC: 9.1 10*3/uL (ref 4.0–10.5)
nRBC: 0 % (ref 0.0–0.2)

## 2023-09-07 LAB — BASIC METABOLIC PANEL WITH GFR
Anion gap: 11 (ref 5–15)
BUN: 19 mg/dL (ref 8–23)
CO2: 30 mmol/L (ref 22–32)
Calcium: 8.8 mg/dL — ABNORMAL LOW (ref 8.9–10.3)
Chloride: 102 mmol/L (ref 98–111)
Creatinine, Ser: 0.89 mg/dL (ref 0.44–1.00)
GFR, Estimated: >60 mL/min (ref >60)
Glucose, Bld: 112 mg/dL — ABNORMAL HIGH (ref 70–99)
Potassium: 3.5 mmol/L (ref 3.5–5.1)
Sodium: 143 mmol/L (ref 135–145)

## 2023-09-07 MED ORDER — FUROSEMIDE 10 MG/ML IJ SOLN
40.0000 mg | Freq: Once | INTRAMUSCULAR | Status: AC
  Administered 2023-09-07: 40 mg via INTRAVENOUS
  Filled 2023-09-07: qty 4

## 2023-09-07 MED ORDER — IPRATROPIUM-ALBUTEROL 0.5-2.5 (3) MG/3ML IN SOLN
3.0000 mL | RESPIRATORY_TRACT | Status: DC
  Administered 2023-09-07 – 2023-09-08 (×6): 3 mL via RESPIRATORY_TRACT
  Filled 2023-09-07 (×6): qty 3

## 2023-09-07 MED ORDER — METHYLPREDNISOLONE SODIUM SUCC 40 MG IJ SOLR
40.0000 mg | Freq: Two times a day (BID) | INTRAMUSCULAR | Status: DC
  Administered 2023-09-07 – 2023-09-09 (×5): 40 mg via INTRAVENOUS
  Filled 2023-09-07 (×5): qty 1

## 2023-09-07 NOTE — Plan of Care (Signed)
  Problem: Coping: Goal: Level of anxiety will decrease Outcome: Progressing   Problem: Respiratory: Goal: Ability to maintain a clear airway will improve Outcome: Progressing

## 2023-09-07 NOTE — Progress Notes (Signed)
 PROGRESS NOTE    Sarah [name]  NGE:[id number] DOB: 1951-01-12 DOA: 09/04/2023 PCP: Thersia Flax, MD    Brief Narrative:   84 female with hypertension, hypothyroid, hyperlipidemia, GERD, who presents emergency department for chief concerns of cough, wheezing, shortness of breath    5/13: Started Senokot and MiraLAX , progressive mobility, wean oxygen 5/14: Remains on 2 L nasal cannula 5/15: Wheezing worsened.  Escalated to 3 to 4 L nasal cannula  Assessment & Plan:   Principal Problem:   Acute hypoxic respiratory failure (HCC) Active Problems:   Right middle lobe pneumonia   Depression   Hyperlipidemia   Hypothyroid   Essential hypertension   Thrombocytopenia (HCC)   Chronic GERD   OSA (obstructive sleep apnea)   Acute hypoxic respiratory failure (HCC) COPD exacerbation Suspect secondary to right middle lobe pneumonia seen on CT chest Patient was on Rocephin  and azithromycin  intravenously however was experiencing pain from her IV site now refusing to have new one placed. 5/15: Respiratory status worsened.  Patient now agreeable to IV line placement Plan: Continue IV Solu-Medrol  40 mg twice daily.  Continue with p.o. Augmentin  for now.  Escalate to IV antibiotic should the patient develop any worsening signs of infection.  Continue with p.o. azithromycin .  Continue scheduled and as needed bronchodilator regimen.  Wean oxygen as tolerated.  Goal saturation 88-92%  Possible thrombophlebitis Swelling and redness noted at previous IV line site on right upper extremity.  Concern for VTE versus thrombophlebitis.  Recommend elevate extremity, place warm compress.  Check right upper extremity venous duplex.   Right middle lobe pneumonia Continue 5-day course of Augmentin  and azithromycin  for now.  Should patient develop any signs of worsening infection we will escalate to IV antibiotics   Essential hypertension Continue amlodipine .  Continue as needed hydralazine .    Hypothyroid Continue levothyroxine  50 mcg daily    Hyperlipidemia Continue rosuvastatin  10 mg daily    Depression Continue Celexa  20 mg daily Continue trazodone  25 to 50 mg nightly as needed sleep   OSA (obstructive sleep apnea) continue PAP nightly   Chronic GERD Continue PPI   Constipation Last BM on 5/10 Continue Senokot as and MiraLAX  Consider KUB if no bowel movement the next 24 hours  Thrombocytopenia Appears relatively chronic and mild.  No bleeding noted.  Continue to monitor   DVT prophylaxis: Subcu heparin  Code Status: Full Family Communication: None Disposition Plan: Status is: Inpatient Remains inpatient appropriate because: Acute respiratory failure secondary to community-acquired pneumonia.  Suspected COPD exacerbation.   Level of care: Telemetry Medical  Consultants:  None  Procedures:  None  Antimicrobials: Augmentin  Azithromycin    Subjective: Seen and examined.  Reports worsening respiratory status.  Continued wheezing.  Objective: Vitals:   09/06/23 1611 09/06/23 1936 09/06/23 2030 09/07/23 0808  BP: (!) 126/59  (!) 136/59 130/60  Pulse: 94  (!) 105 86  Resp: 19  16 18   Temp: 98.1 F (36.7 C)  98.2 F (36.8 C) 98.6 F (37 C)  TempSrc: Oral  Oral   SpO2: 97% 95% 98% 95%  Weight:      Height:        Intake/Output Summary (Last 24 hours) at 09/07/2023 1033 Last data filed at 09/06/2023 1500 Gross per 24 hour  Intake 120 ml  Output --  Net 120 ml   Filed Weights   09/04/23 1638  Weight: 83.5 kg    Examination:  General exam: Appears fatigued and uncomfortable Respiratory system: Diffuse end expiratory wheeze.  Scattered coarse  crackles.  Normal work of breathing.  3 L Cardiovascular system: S1-S2, regular rhythm, tachycardic, no murmurs Gastrointestinal system: Soft, NT/ND, normal bowel sounds Central nervous system: Alert and oriented. No focal neurological deficits. Extremities: Symmetric 5 x 5 power. Skin: No rashes,  lesions or ulcers Psychiatry: Judgement and insight appear normal. Mood & affect appropriate.     Data Reviewed: I have personally reviewed following labs and imaging studies  CBC: Recent Labs  Lab 09/04/23 1640 09/05/23 0539 09/06/23 0242 09/07/23 0821  WBC 10.2 10.6* 14.8* 9.1  NEUTROABS  --   --   --  5.9  HGB 10.3* 9.5* 9.1* 9.0*  HCT 31.3* 28.1* 27.6* 26.9*  MCV 114.7* 115.6* 115.5* 114.0*  PLT 106* 86* 94* 93*   Basic Metabolic Panel: Recent Labs  Lab 09/04/23 1640 09/05/23 0539 09/06/23 0242 09/07/23 0821  NA 136 133* 138 143  K 3.6 4.0 4.2 3.5  CL 101 101 104 102  CO2 26 23 25 30   GLUCOSE 128* 246* 183* 112*  BUN 14 17 21 19   CREATININE 1.02* 1.21* 0.91 0.89  CALCIUM  9.2 8.7* 8.6* 8.8*   GFR: Estimated Creatinine Clearance: 56.7 mL/min (by C-G formula based on SCr of 0.89 mg/dL). Liver Function Tests: Recent Labs  Lab 09/04/23 1640  AST 17  ALT 17  ALKPHOS 66  BILITOT 0.9  PROT 7.2  ALBUMIN 4.1   Recent Labs  Lab 09/04/23 1640  LIPASE 22   No results for input(s): "AMMONIA" in the last 168 hours. Coagulation Profile: No results for input(s): "INR", "PROTIME" in the last 168 hours. Cardiac Enzymes: No results for input(s): "CKTOTAL", "CKMB", "CKMBINDEX", "TROPONINI" in the last 168 hours. BNP (last 3 results) No results for input(s): "PROBNP" in the last 8760 hours. HbA1C: No results for input(s): "HGBA1C" in the last 72 hours. CBG: No results for input(s): "GLUCAP" in the last 168 hours. Lipid Profile: No results for input(s): "CHOL", "HDL", "LDLCALC", "TRIG", "CHOLHDL", "LDLDIRECT" in the last 72 hours. Thyroid  Function Tests: No results for input(s): "TSH", "T4TOTAL", "FREET4", "T3FREE", "THYROIDAB" in the last 72 hours. Anemia Panel: No results for input(s): "VITAMINB12", "FOLATE", "FERRITIN", "TIBC", "IRON", "RETICCTPCT" in the last 72 hours. Sepsis Labs: No results for input(s): "PROCALCITON", "LATICACIDVEN" in the last 168  hours.  Recent Results (from the past 240 hours)  Resp panel by RT-PCR (RSV, Flu A&B, Covid) Anterior Nasal Swab     Status: None   Collection Time: 09/04/23  6:38 PM   Specimen: Anterior Nasal Swab  Result Value Ref Range Status   SARS Coronavirus 2 by RT PCR NEGATIVE NEGATIVE Final    Comment: (NOTE) SARS-CoV-2 target nucleic acids are NOT DETECTED.  The SARS-CoV-2 RNA is generally detectable in upper respiratory specimens during the acute phase of infection. The lowest concentration of SARS-CoV-2 viral copies this assay can detect is 138 copies/mL. A negative result does not preclude SARS-Cov-2 infection and should not be used as the sole basis for treatment or other patient management decisions. A negative result may occur with  improper specimen collection/handling, submission of specimen other than nasopharyngeal swab, presence of viral mutation(s) within the areas targeted by this assay, and inadequate number of viral copies(<138 copies/mL). A negative result must be combined with clinical observations, patient history, and epidemiological information. The expected result is Negative.  Fact Sheet for Patients:  BloggerCourse.com  Fact Sheet for Healthcare Providers:  SeriousBroker.it  This test is no t yet approved or cleared by the [place]  FDA and  has  been authorized for detection and/or diagnosis of SARS-CoV-2 by FDA under an Emergency Use Authorization (EUA). This EUA will remain  in effect (meaning this test can be used) for the duration of the COVID-19 declaration under Section 564(b)(1) of the Act, 21 U.S.C.section 360bbb-3(b)(1), unless the authorization is terminated  or revoked sooner.       Influenza A by PCR NEGATIVE NEGATIVE Final   Influenza B by PCR NEGATIVE NEGATIVE Final    Comment: (NOTE) The Xpert Xpress SARS-CoV-2/FLU/RSV plus assay is intended as an aid in the diagnosis of influenza from  Nasopharyngeal swab specimens and should not be used as a sole basis for treatment. Nasal washings and aspirates are unacceptable for Xpert Xpress SARS-CoV-2/FLU/RSV testing.  Fact Sheet for Patients: BloggerCourse.com  Fact Sheet for Healthcare Providers: SeriousBroker.it  This test is not yet approved or cleared by the [place]  FDA and has been authorized for detection and/or diagnosis of SARS-CoV-2 by FDA under an Emergency Use Authorization (EUA). This EUA will remain in effect (meaning this test can be used) for the duration of the COVID-19 declaration under Section 564(b)(1) of the Act, 21 U.S.C. section 360bbb-3(b)(1), unless the authorization is terminated or revoked.     Resp Syncytial Virus by PCR NEGATIVE NEGATIVE Final    Comment: (NOTE) Fact Sheet for Patients: BloggerCourse.com  Fact Sheet for Healthcare Providers: SeriousBroker.it  This test is not yet approved or cleared by the [place]  FDA and has been authorized for detection and/or diagnosis of SARS-CoV-2 by FDA under an Emergency Use Authorization (EUA). This EUA will remain in effect (meaning this test can be used) for the duration of the COVID-19 declaration under Section 564(b)(1) of the Act, 21 U.S.C. section 360bbb-3(b)(1), unless the authorization is terminated or revoked.  Performed at [facility], [address] [place], [place] [address]          Radiology Studies: DG Chest [place]nard 1 View Result Date: 09/06/2023 CLINICAL DATA:  200808 Hypoxia 191478. EXAM: PORTABLE CHEST 1 VIEW COMPARISON:  09/04/2023. FINDINGS: Low lung volume. Redemonstration of heterogeneous alveolar opacities overlying the right mid lower lung zones, laterally, which corresponds to tree-in-bud configuration nodules on the recent CT scan abdomen and pelvis and favored to represent pneumonitis. Follow-up to  clearing is recommended. Bilateral lung fields are otherwise clear. Bilateral costophrenic angles are clear. Stable cardio-mediastinal silhouette. No acute osseous abnormalities. Cervicothoracic spinal fixation hardware noted. The soft tissues are within normal limits. IMPRESSION: Redemonstration of heterogeneous alveolar opacities overlying the right mid lower lung zones, laterally, which corresponds to tree-in-bud configuration nodules on the recent CT scan abdomen and pelvis and favored to represent pneumonitis. Follow-up to clearing is recommended. Electronically Signed   By: Beula Brunswick M.D.   On: 09/06/2023 15:52        Scheduled Meds:  amLODipine   5 mg Oral Daily   amoxicillin -clavulanate  1 tablet Oral Q12H   arformoterol  15 mcg Nebulization BID   azithromycin   250 mg Oral Daily   budesonide  (PULMICORT ) nebulizer solution  0.25 mg Nebulization BID   citalopram   20 mg Oral Daily   ferrous sulfate   648 mg Oral Q breakfast   gabapentin   100 mg Oral TID   heparin   5,000 Units Subcutaneous Q8H   ipratropium-albuterol   3 mL Nebulization Q4H   levothyroxine   50 mcg Oral Q0600   methylPREDNISolone  (SOLU-MEDROL ) injection  40 mg Intravenous Q12H   pantoprazole   40 mg Oral Daily   polyethylene glycol  17 g Oral BID  rosuvastatin   10 mg Oral Daily   senna-docusate  2 tablet Oral BID   Continuous Infusions:   LOS: 3 days    Tiajuana Fluke, MD [facility]   If 7PM-7AM, please contact night-coverage  09/07/2023, 10:33 AM

## 2023-09-07 NOTE — Care Management Important Message (Signed)
 Important Message  Patient Details  Name: Sarah [name] MRN: [id number] Date of Birth: 06/28/1950   Important Message Given:  Yes - Medicare IM     Lenus Trauger W, CMA 09/07/2023, 11:56 AM

## 2023-09-07 NOTE — Progress Notes (Signed)
 Platelets 94. Holding SQ heparin  today. MD notified.

## 2023-09-08 DIAGNOSIS — J9601 Acute respiratory failure with hypoxia: Secondary | ICD-10-CM | POA: Diagnosis not present

## 2023-09-08 MED ORDER — HYDROCOD POLI-CHLORPHE POLI ER 10-8 MG/5ML PO SUER
5.0000 mL | Freq: Two times a day (BID) | ORAL | Status: DC
  Administered 2023-09-08 – 2023-09-09 (×2): 5 mL via ORAL
  Filled 2023-09-08 (×2): qty 5

## 2023-09-08 MED ORDER — BENZONATATE 100 MG PO CAPS
200.0000 mg | ORAL_CAPSULE | Freq: Three times a day (TID) | ORAL | Status: DC
  Administered 2023-09-08 – 2023-09-09 (×3): 200 mg via ORAL
  Filled 2023-09-08 (×3): qty 2

## 2023-09-08 MED ORDER — GUAIFENESIN-DM 100-10 MG/5ML PO SYRP
5.0000 mL | ORAL_SOLUTION | ORAL | Status: DC | PRN
  Administered 2023-09-08 – 2023-09-09 (×4): 5 mL via ORAL
  Filled 2023-09-08 (×4): qty 10

## 2023-09-08 MED ORDER — IPRATROPIUM-ALBUTEROL 0.5-2.5 (3) MG/3ML IN SOLN
3.0000 mL | Freq: Four times a day (QID) | RESPIRATORY_TRACT | Status: DC
  Administered 2023-09-08 – 2023-09-09 (×4): 3 mL via RESPIRATORY_TRACT
  Filled 2023-09-08 (×4): qty 3

## 2023-09-08 NOTE — Plan of Care (Signed)
  Problem: Clinical Measurements: Goal: Will remain free from infection Outcome: Progressing Goal: Diagnostic test results will improve Outcome: Progressing Goal: Respiratory complications will improve Outcome: Progressing Goal: Cardiovascular complication will be avoided Outcome: Progressing   Problem: Activity: Goal: Risk for activity intolerance will decrease Outcome: Progressing   Problem: Coping: Goal: Level of anxiety will decrease Outcome: Progressing   Problem: Elimination: Goal: Will not experience complications related to bowel motility Outcome: Progressing Goal: Will not experience complications related to urinary retention Outcome: Progressing   Problem: Safety: Goal: Ability to remain free from injury will improve Outcome: Progressing   Problem: Activity: Goal: Ability to tolerate increased activity will improve Outcome: Progressing   Problem: Clinical Measurements: Goal: Ability to maintain a body temperature in the normal range will improve Outcome: Progressing   Problem: Respiratory: Goal: Ability to maintain adequate ventilation will improve Outcome: Progressing Goal: Ability to maintain a clear airway will improve Outcome: Progressing

## 2023-09-08 NOTE — Progress Notes (Signed)
 PROGRESS NOTE    Sarah [name]  RUE:[id number] DOB: Apr 21, 1951 DOA: 09/04/2023 PCP: Thersia Flax, MD    Brief Narrative:   81 female with hypertension, hypothyroid, hyperlipidemia, GERD, who presents emergency department for chief concerns of cough, wheezing, shortness of breath    5/13: Started Senokot and MiraLAX , progressive mobility, wean oxygen 5/14: Remains on 2 L nasal cannula 5/15: Wheezing worsened.  Escalated to 3 to 4 L nasal cannula 5/16: Respiratory status improving  Assessment & Plan:   Principal Problem:   Acute hypoxic respiratory failure (HCC) Active Problems:   Right middle lobe pneumonia   Depression   Hyperlipidemia   Hypothyroid   Essential hypertension   Thrombocytopenia (HCC)   Chronic GERD   OSA (obstructive sleep apnea)   Acute hypoxic respiratory failure (HCC) COPD exacerbation Suspect secondary to right middle lobe pneumonia seen on CT chest Patient was on Rocephin  and azithromycin  intravenously however was experiencing pain from her IV site now refusing to have new one placed. 5/15: Respiratory status worsened.  Patient now agreeable to IV line placement 5/16: Respiratory status improving Plan: Continue with IV Solu-Medrol  40 mg twice daily.  Continue p.o. Augmentin .  Continue p.o. azithromycin .  Continue scheduled and as needed bronchodilators.  Wean oxygen as tolerated.  Goal saturation 88-92%.  Case discussed with Dr. Viva Grise, outpatient pulmonologist.  She will see as outpatient after stabilization.  Possible thrombophlebitis, ruled out Swelling and redness noted at previous IV line site on right upper extremity.  Concern for VTE versus thrombophlebitis.  No mention on ultrasound.  Recommend arm elevation and warm compress placement   Right middle lobe pneumonia Complete 5-day course of Augmentin  and azithromycin    Essential hypertension Continue Norvasc .  Continue as needed hydralazine    Hypothyroid Continue levothyroxine  50 mcg  daily    Hyperlipidemia Continue rosuvastatin  10 mg daily    Depression Continue Celexa  20 mg daily Continue trazodone  25 to 50 mg nightly as needed sleep   OSA (obstructive sleep apnea) Continue CPAP nightly   Chronic GERD Continue daily PPI   Constipation Last BM on 5/10 Continue Senokot and MiraLAX   Thrombocytopenia Chronic appearing.  No bleeding noted.  Continue to monitor.   DVT prophylaxis: Subcu heparin  Code Status: Full Family Communication: None Disposition Plan: Status is: Inpatient Remains inpatient appropriate because: Acute respiratory failure secondary to community-acquired pneumonia and suspected COPD flare   Level of care: Telemetry Medical  Consultants:  None  Procedures:  None  Antimicrobials: Augmentin  Azithromycin    Subjective: Seen and examined.  Improved respiratory status.  Remains on 2 L.  Wheezing improved.  Objective: Vitals:   09/08/23 0327 09/08/23 0359 09/08/23 0853 09/08/23 0926  BP:  (!) 133/59 122/61 122/61  Pulse:  96 78   Resp:  15 16   Temp:  98.2 F (36.8 C) 98.1 F (36.7 C)   TempSrc:  Oral Oral   SpO2: 95% 93% 93%   Weight:      Height:        Intake/Output Summary (Last 24 hours) at 09/08/2023 1236 Last data filed at 09/08/2023 1053 Gross per 24 hour  Intake 840 ml  Output 3 ml  Net 837 ml   Filed Weights   09/04/23 1638  Weight: 83.5 kg    Examination:  General exam: NAD.  Energy level improved Respiratory system: Mild bibasilar wheeze.  No crackles.  Normal work of breathing.  2 L Cardiovascular system: S1-S2, RRR, no murmurs, no pedal edema Gastrointestinal system: Soft, NT/ND, normal bowel  sounds Central nervous system: Alert and oriented. No focal neurological deficits. Extremities: Symmetric 5 x 5 power. Skin: No rashes, lesions or ulcers Psychiatry: Judgement and insight appear normal. Mood & affect appropriate.     Data Reviewed: I have personally reviewed following labs and imaging  studies  CBC: Recent Labs  Lab 09/04/23 1640 09/05/23 0539 09/06/23 0242 09/07/23 0821  WBC 10.2 10.6* 14.8* 9.1  NEUTROABS  --   --   --  5.9  HGB 10.3* 9.5* 9.1* 9.0*  HCT 31.3* 28.1* 27.6* 26.9*  MCV 114.7* 115.6* 115.5* 114.0*  PLT 106* 86* 94* 93*   Basic Metabolic Panel: Recent Labs  Lab 09/04/23 1640 09/05/23 0539 09/06/23 0242 09/07/23 0821  NA 136 133* 138 143  K 3.6 4.0 4.2 3.5  CL 101 101 104 102  CO2 26 23 25 30   GLUCOSE 128* 246* 183* 112*  BUN 14 17 21 19   CREATININE 1.02* 1.21* 0.91 0.89  CALCIUM  9.2 8.7* 8.6* 8.8*   GFR: Estimated Creatinine Clearance: 56.7 mL/min (by C-G formula based on SCr of 0.89 mg/dL). Liver Function Tests: Recent Labs  Lab 09/04/23 1640  AST 17  ALT 17  ALKPHOS 66  BILITOT 0.9  PROT 7.2  ALBUMIN 4.1   Recent Labs  Lab 09/04/23 1640  LIPASE 22   No results for input(s): "AMMONIA" in the last 168 hours. Coagulation Profile: No results for input(s): "INR", "PROTIME" in the last 168 hours. Cardiac Enzymes: No results for input(s): "CKTOTAL", "CKMB", "CKMBINDEX", "TROPONINI" in the last 168 hours. BNP (last 3 results) No results for input(s): "PROBNP" in the last 8760 hours. HbA1C: No results for input(s): "HGBA1C" in the last 72 hours. CBG: No results for input(s): "GLUCAP" in the last 168 hours. Lipid Profile: No results for input(s): "CHOL", "HDL", "LDLCALC", "TRIG", "CHOLHDL", "LDLDIRECT" in the last 72 hours. Thyroid  Function Tests: No results for input(s): "TSH", "T4TOTAL", "FREET4", "T3FREE", "THYROIDAB" in the last 72 hours. Anemia Panel: No results for input(s): "VITAMINB12", "FOLATE", "FERRITIN", "TIBC", "IRON", "RETICCTPCT" in the last 72 hours. Sepsis Labs: No results for input(s): "PROCALCITON", "LATICACIDVEN" in the last 168 hours.  Recent Results (from the past 240 hours)  Resp panel by RT-PCR (RSV, Flu A&B, Covid) Anterior Nasal Swab     Status: None   Collection Time: 09/04/23  6:38 PM    Specimen: Anterior Nasal Swab  Result Value Ref Range Status   SARS Coronavirus 2 by RT PCR NEGATIVE NEGATIVE Final    Comment: (NOTE) SARS-CoV-2 target nucleic acids are NOT DETECTED.  The SARS-CoV-2 RNA is generally detectable in upper respiratory specimens during the acute phase of infection. The lowest concentration of SARS-CoV-2 viral copies this assay can detect is 138 copies/mL. A negative result does not preclude SARS-Cov-2 infection and should not be used as the sole basis for treatment or other patient management decisions. A negative result may occur with  improper specimen collection/handling, submission of specimen other than nasopharyngeal swab, presence of viral mutation(s) within the areas targeted by this assay, and inadequate number of viral copies(<138 copies/mL). A negative result must be combined with clinical observations, patient history, and epidemiological information. The expected result is Negative.  Fact Sheet for Patients:  BloggerCourse.com  Fact Sheet for Healthcare Providers:  SeriousBroker.it  This test is no t yet approved or cleared by the [place]  FDA and  has been authorized for detection and/or diagnosis of SARS-CoV-2 by FDA under an Emergency Use Authorization (EUA). This EUA will remain  in effect (  meaning this test can be used) for the duration of the COVID-19 declaration under Section 564(b)(1) of the Act, 21 U.S.C.section 360bbb-3(b)(1), unless the authorization is terminated  or revoked sooner.       Influenza A by PCR NEGATIVE NEGATIVE Final   Influenza B by PCR NEGATIVE NEGATIVE Final    Comment: (NOTE) The Xpert Xpress SARS-CoV-2/FLU/RSV plus assay is intended as an aid in the diagnosis of influenza from Nasopharyngeal swab specimens and should not be used as a sole basis for treatment. Nasal washings and aspirates are unacceptable for Xpert Xpress  SARS-CoV-2/FLU/RSV testing.  Fact Sheet for Patients: BloggerCourse.com  Fact Sheet for Healthcare Providers: SeriousBroker.it  This test is not yet approved or cleared by the [place]  FDA and has been authorized for detection and/or diagnosis of SARS-CoV-2 by FDA under an Emergency Use Authorization (EUA). This EUA will remain in effect (meaning this test can be used) for the duration of the COVID-19 declaration under Section 564(b)(1) of the Act, 21 U.S.C. section 360bbb-3(b)(1), unless the authorization is terminated or revoked.     Resp Syncytial Virus by PCR NEGATIVE NEGATIVE Final    Comment: (NOTE) Fact Sheet for Patients: BloggerCourse.com  Fact Sheet for Healthcare Providers: SeriousBroker.it  This test is not yet approved or cleared by the [place]  FDA and has been authorized for detection and/or diagnosis of SARS-CoV-2 by FDA under an Emergency Use Authorization (EUA). This EUA will remain in effect (meaning this test can be used) for the duration of the COVID-19 declaration under Section 564(b)(1) of the Act, 21 U.S.C. section 360bbb-3(b)(1), unless the authorization is terminated or revoked.  Performed at [facility], [address], [place], [place] [address]          Radiology Studies: US  Venous Img Upper Uni Left (DVT) Result Date: 09/07/2023 CLINICAL DATA:  Pain and swelling in the left arm at location of previous IV site for approximately 1 day. Patient's skin demonstrates color changes (red). EXAM: Left UPPER EXTREMITY VENOUS DOPPLER ULTRASOUND TECHNIQUE: Gray-scale sonography with graded compression, as well as color Doppler and duplex ultrasound were performed to evaluate the upper extremity deep venous system from the level of the subclavian vein and including the jugular, axillary, basilic, radial, ulnar and upper cephalic vein.  Spectral Doppler was utilized to evaluate flow at rest and with distal augmentation maneuvers. COMPARISON:  None Available. FINDINGS: Contralateral Subclavian Vein: Respiratory phasicity is normal and symmetric with the symptomatic side. No evidence of thrombus. Normal compressibility. Internal Jugular Vein: No evidence of thrombus. Normal compressibility, respiratory phasicity and response to augmentation. Subclavian Vein: No evidence of thrombus. Normal compressibility, respiratory phasicity and response to augmentation. Axillary Vein: No evidence of thrombus. Normal compressibility, respiratory phasicity and response to augmentation. Cephalic Vein: No evidence of thrombus. Normal compressibility, respiratory phasicity and response to augmentation. Basilic Vein: Focal SVT in a branch of the left basilic vein at the antecubital fossa. Brachial Veins: No evidence of thrombus. Normal compressibility, respiratory phasicity and response to augmentation. Radial Veins: No evidence of thrombus. Normal compressibility, respiratory phasicity and response to augmentation. Ulnar Veins: No evidence of thrombus. Normal compressibility, respiratory phasicity and response to augmentation. Venous Reflux:  None visualized. Other Findings:  None visualized. IMPRESSION: No evidence of DVT within the left upper extremity. Acute SVT in the left upper extremity as there is focal occlusion of a small venous branch off the basilic vein at the antecubital fossa Electronically Signed   By: Susan Ensign   On: 09/07/2023  14:38        Scheduled Meds:  amLODipine   5 mg Oral Daily   amoxicillin -clavulanate  1 tablet Oral Q12H   arformoterol  15 mcg Nebulization BID   budesonide  (PULMICORT ) nebulizer solution  0.25 mg Nebulization BID   citalopram   20 mg Oral Daily   ferrous sulfate   648 mg Oral Q breakfast   gabapentin   100 mg Oral TID   heparin   5,000 Units Subcutaneous Q8H   ipratropium-albuterol   3 mL Nebulization Q6H    levothyroxine   50 mcg Oral Q0600   methylPREDNISolone  (SOLU-MEDROL ) injection  40 mg Intravenous Q12H   pantoprazole   40 mg Oral Daily   polyethylene glycol  17 g Oral BID   rosuvastatin   10 mg Oral Daily   senna-docusate  2 tablet Oral BID   Continuous Infusions:   LOS: 4 days    Tiajuana Fluke, MD [facility]   If 7PM-7AM, please contact night-coverage  09/08/2023, 12:36 PM

## 2023-09-08 NOTE — TOC Progression Note (Signed)
 Transition of Care [facility]) - Progression Note    Patient Details  Name: Sarah [name] MRN: [id number] Date of Birth: 1950-07-14  [facility] CM/SW Contact  Alexandra Ice, RN Phone Number: 09/08/2023, 3:54 PM  Clinical Narrative:    Patient remains on 2LPM via nasal cannula, weaning as tolerated. TOC to monitor for discharge needs.    Expected Discharge Plan: Home/Self Care Barriers to Discharge: Continued Medical Work up  Expected Discharge Plan and Services       Living arrangements for the past 2 months: Single Family Home                   DME Agency: NA       HH Arranged: NA           Social Determinants of Health (SDOH) Interventions SDOH Screenings   Food Insecurity: No Food Insecurity (09/05/2023)  Housing: Low Risk  (09/05/2023)  Transportation Needs: No Transportation Needs (09/05/2023)  Utilities: Not At Risk (09/05/2023)  Alcohol Screen: Low Risk  (05/15/2023)  Depression (PHQ2-9): Medium Risk (05/15/2023)  Financial Resource Strain: Low Risk  (05/15/2023)  Physical Activity: Inactive (05/15/2023)  Social Connections: Moderately Integrated (09/05/2023)  Stress: No Stress Concern Present (05/15/2023)  Tobacco Use: Low Risk  (09/04/2023)  Health Literacy: Adequate Health Literacy (05/15/2023)    Readmission Risk Interventions     No data to display

## 2023-09-09 DIAGNOSIS — J9601 Acute respiratory failure with hypoxia: Secondary | ICD-10-CM | POA: Diagnosis not present

## 2023-09-09 MED ORDER — BUDESONIDE 0.25 MG/2ML IN SUSP: 0.2500 mg | Freq: Two times a day (BID) | RESPIRATORY_TRACT | Status: DC

## 2023-09-09 MED ORDER — IPRATROPIUM-ALBUTEROL 0.5-2.5 (3) MG/3ML IN SOLN: 3.0000 mL | Freq: Four times a day (QID) | RESPIRATORY_TRACT | Status: DC

## 2023-09-09 MED ORDER — ARFORMOTEROL TARTRATE 15 MCG/2ML IN NEBU
15.0000 ug | INHALATION_SOLUTION | Freq: Two times a day (BID) | RESPIRATORY_TRACT | Status: DC
  Filled 2023-09-09: qty 2

## 2023-09-09 MED ORDER — IPRATROPIUM-ALBUTEROL 20-100 MCG/ACT IN AERS: 1.0000 | INHALATION_SPRAY | Freq: Four times a day (QID) | RESPIRATORY_TRACT | Status: DC

## 2023-09-09 MED ORDER — PREDNISONE 20 MG PO TABS: 40.0000 mg | ORAL_TABLET | Freq: Every day | ORAL | 0 refills | Status: AC

## 2023-09-09 MED ORDER — BUDESON-GLYCOPYRROL-FORMOTEROL 160-9-4.8 MCG/ACT IN AERO
2.0000 | INHALATION_SPRAY | Freq: Two times a day (BID) | RESPIRATORY_TRACT | Status: DC
  Filled 2023-09-09: qty 5.9

## 2023-09-09 MED ORDER — BENZONATATE 200 MG PO CAPS: 200.0000 mg | ORAL_CAPSULE | Freq: Three times a day (TID) | ORAL | 0 refills | Status: DC | PRN

## 2023-09-09 NOTE — Discharge Summary (Signed)
 Physician Discharge Summary  Sarah [name] UEA:[id number] DOB: [birth year] DOA: 09/04/2023  PCP: Thersia Flax, MD  Admit date: 09/04/2023 Discharge date: 09/09/2023  Admitted From: Home Disposition:  Home  Recommendations for Outpatient Follow-up:  Follow up with PCP in 1-2 weeks Follow-up with pulmonology 2 to 3 weeks  Home Health: No Equipment/Devices: None  Discharge Condition: Stable CODE STATUS: Full Diet recommendation: Regular  Brief/Interim Summary:  69 female with hypertension, hypothyroid, hyperlipidemia, GERD, who presents emergency department for chief concerns of cough, wheezing, shortness of breath    5/13: Started Senokot and MiraLAX , progressive mobility, wean oxygen 5/14: Remains on 2 L nasal cannula 5/15: Wheezing worsened.  Escalated to 3 to 4 L nasal cannula 5/16: Respiratory status improving 5/17: Respiratory status optimized.  No desaturation even with ambulation.  Stable on room air.  Now stable for discharge home at this time.  Follow-up outpatient PCP and pulmonology.    Discharge Diagnoses:  Principal Problem:   Acute hypoxic respiratory failure (HCC) Active Problems:   Right middle lobe pneumonia   Depression   Hyperlipidemia   Hypothyroid   Essential hypertension   Thrombocytopenia (HCC)   Chronic GERD   OSA (obstructive sleep apnea) Acute hypoxic respiratory failure (HCC) COPD exacerbation Suspect secondary to right middle lobe pneumonia seen on CT chest Patient was on Rocephin  and azithromycin  intravenously however was experiencing pain from her IV site now refusing to have new one placed. 5/15: Respiratory status worsened.  Patient now agreeable to IV line placement 5/16: Respiratory status improving 5/17: Respiratory status optimized Plan: Discontinue IV Solu-Medrol .  Completed course of antibiotics.  No antibiotics indicated on DC.  Resume home nebulizer and bronchodilator regimen.  On room air.  No indication for oxygen.  Short  course of prednisone  provided.  Follow-up outpatient PCP and pulmonology.  Discharge Instructions  Discharge Instructions     Diet - low sodium heart healthy   Complete by: As directed    Increase activity slowly   Complete by: As directed       Allergies as of 09/09/2023   No Known Allergies      Medication List     TAKE these medications    albuterol  108 (90 Base) MCG/ACT inhaler Commonly known as: VENTOLIN  HFA Inhale 1-2 puffs into the lungs every 6 (six) hours as needed for wheezing or shortness of breath.   amLODipine  5 MG tablet Commonly known as: NORVASC  TAKE 1 TABLET BY MOUTH DAILY   azithromycin  250 MG tablet Commonly known as: Zithromax  Take 1 tablet (250 mg total) by mouth 3 (three) times a week. M-W-F   benzonatate  200 MG capsule Commonly known as: TESSALON  Take 1 capsule (200 mg total) by mouth 3 (three) times daily as needed for cough.   CALCIUM  CITRATE + D PO Take 1 tablet by mouth 2 (two) times daily.   citalopram  20 MG tablet Commonly known as: CELEXA  TAKE 1 TABLET BY MOUTH DAILY   cyanocobalamin  1000 MCG tablet Commonly known as: VITAMIN B12 Take 1,000 mcg by mouth daily.   famotidine  20 MG tablet Commonly known as: PEPCID  TAKE 1 TABLET BY MOUTH DAILY. BEFORE DINNER   ferrous sulfate  324 MG Tbec Take 2 tablets by mouth. 65 mg of elemental iron   fexofenadine 180 MG tablet Commonly known as: ALLEGRA Take 180 mg by mouth daily.   gabapentin  100 MG capsule Commonly known as: NEURONTIN  TAKE 1 CAPSULE BY MOUTH 3 TIMES  DAILY   levothyroxine  50 MCG tablet Commonly known as:  SYNTHROID  TAKE 1 TABLET BY MOUTH DAILY ON  AN EMPTY STOMACH WITH A GLASS OF WATER AT LEAST 30 TO 60 MINUTES  BEFORE BREAKFAST   pantoprazole  40 MG tablet Commonly known as: PROTONIX  TAKE 1 TABLET BY MOUTH DAILY   predniSONE  20 MG tablet Commonly known as: DELTASONE  Take 2 tablets (40 mg total) by mouth daily with breakfast for 5 days.   rosuvastatin  10 MG  tablet Commonly known as: CRESTOR  TAKE ONE TABLET BY MOUTH ONCE DAILY   solifenacin  5 MG tablet Commonly known as: VESICARE  TAKE ONE TABLET BY MOUTH EVERY DAY   traZODone  50 MG tablet Commonly known as: DESYREL  TAKE 1/2 TO 1 TABLET BY MOUTH AT BEDTIME AS NEEDED FOR SLEEP   Trelegy Ellipta  200-62.5-25 MCG/ACT Aepb Generic drug: Fluticasone -Umeclidin-Vilant Inhale 1 puff into the lungs daily.        Follow-up Information     Thersia Flax, MD. Schedule an appointment as soon as possible for a visit in 1 week(s).   Specialty: Internal Medicine Contact information: [address] [place] [address] [phone]         Marc Senior, MD. Schedule an appointment as soon as possible for a visit in 2 week(s).   Specialty: Pulmonary Disease Contact information: [address] [place] [place] [address] [phone]                No Known Allergies  Consultations: None   Procedures/Studies: US  Venous Img Upper Uni Left (DVT) Result Date: 09/07/2023 CLINICAL DATA:  Pain and swelling in the left arm at location of previous IV site for approximately 1 day. Patient's skin demonstrates color changes (red). EXAM: Left UPPER EXTREMITY VENOUS DOPPLER ULTRASOUND TECHNIQUE: Gray-scale sonography with graded compression, as well as color Doppler and duplex ultrasound were performed to evaluate the upper extremity deep venous system from the level of the subclavian vein and including the jugular, axillary, basilic, radial, ulnar and upper cephalic vein. Spectral Doppler was utilized to evaluate flow at rest and with distal augmentation maneuvers. COMPARISON:  None Available. FINDINGS: Contralateral Subclavian Vein: Respiratory phasicity is normal and symmetric with the symptomatic side. No evidence of thrombus. Normal compressibility. Internal Jugular Vein: No evidence of thrombus. Normal compressibility, respiratory phasicity and response to  augmentation. Subclavian Vein: No evidence of thrombus. Normal compressibility, respiratory phasicity and response to augmentation. Axillary Vein: No evidence of thrombus. Normal compressibility, respiratory phasicity and response to augmentation. Cephalic Vein: No evidence of thrombus. Normal compressibility, respiratory phasicity and response to augmentation. Basilic Vein: Focal SVT in a branch of the left basilic vein at the antecubital fossa. Brachial Veins: No evidence of thrombus. Normal compressibility, respiratory phasicity and response to augmentation. Radial Veins: No evidence of thrombus. Normal compressibility, respiratory phasicity and response to augmentation. Ulnar Veins: No evidence of thrombus. Normal compressibility, respiratory phasicity and response to augmentation. Venous Reflux:  None visualized. Other Findings:  None visualized. IMPRESSION: No evidence of DVT within the left upper extremity. Acute SVT in the left upper extremity as there is focal occlusion of a small venous branch off the basilic vein at the antecubital fossa Electronically Signed   By: Susan Ensign   On: 09/07/2023 14:38   DG Chest Port 1 View Result Date: 09/06/2023 CLINICAL DATA:  200808 Hypoxia 200808. EXAM: PORTABLE CHEST 1 VIEW COMPARISON:  09/04/2023. FINDINGS: Low lung volume. Redemonstration of heterogeneous alveolar opacities overlying the right mid lower lung zones, laterally, which corresponds to tree-in-bud configuration nodules on the  recent CT scan abdomen and pelvis and favored to represent pneumonitis. Follow-up to clearing is recommended. Bilateral lung fields are otherwise clear. Bilateral costophrenic angles are clear. Stable cardio-mediastinal silhouette. No acute osseous abnormalities. Cervicothoracic spinal fixation hardware noted. The soft tissues are within normal limits. IMPRESSION: Redemonstration of heterogeneous alveolar opacities overlying the right mid lower lung zones, laterally, which  corresponds to tree-in-bud configuration nodules on the recent CT scan abdomen and pelvis and favored to represent pneumonitis. Follow-up to clearing is recommended. Electronically Signed   By: Beula Brunswick M.D.   On: 09/06/2023 15:52   CT ABDOMEN PELVIS W CONTRAST Result Date: 09/04/2023 CLINICAL DATA:  Generalized abdominal pain on the right for 2 days, initial encounter EXAM: CT ABDOMEN AND PELVIS WITH CONTRAST TECHNIQUE: Multidetector CT imaging of the abdomen and pelvis was performed using the standard protocol following bolus administration of intravenous contrast. RADIATION DOSE REDUCTION: This exam was performed according to the departmental dose-optimization program which includes automated exposure control, adjustment of the mA and/or kV according to patient size and/or use of iterative reconstruction technique. CONTRAST:  OMNIPAQUE  IOHEXOL  300 MG/ML  SOLN COMPARISON:  None Available. FINDINGS: Lower chest: Lung bases demonstrate some minimal tree-in-bud changes in the right mid of uncertain chronicity. Hepatobiliary: Simple cyst is noted in the left hepatic lobe. No other hepatic abnormality is noted. The gallbladder is within normal limits. Pancreas: Unremarkable. No pancreatic ductal dilatation or surrounding inflammatory changes. Spleen: Normal in size without focal abnormality. Adrenals/Urinary Tract: Adrenal glands are within normal limits. Kidneys are well visualized bilaterally. Multiple parapelvic renal cysts are noted bilaterally. No renal calculi or obstructive changes are seen. Normal excretion is noted on delayed images. Bladder is decompressed. Stomach/Bowel: Colon shows scattered fecal material throughout without obstructive change. The appendix is not well visualized. No inflammatory changes to suggest appendicitis are seen. Small bowel and stomach are unremarkable. Vascular/Lymphatic: Aortic atherosclerosis. No enlarged abdominal or pelvic lymph nodes. Reproductive: Status post  hysterectomy. No adnexal masses. Other: No abdominal wall hernia or abnormality. No abdominopelvic ascites. Musculoskeletal: No acute or significant osseous findings. IMPRESSION: Very mild tree-in-bud changes in the right middle lobe which may represent acute infiltrate. Simple hepatic and renal cysts are noted. No follow-up is recommended. No acute abnormality to correspond with the given clinical history is seen. Electronically Signed   By: Violeta Grey M.D.   On: 09/04/2023 19:34   DG Chest 2 View Result Date: 09/04/2023 CLINICAL DATA:  Cough. EXAM: CHEST - 2 VIEW COMPARISON:  08/15/2022. FINDINGS: There are nonspecific opacities seen overlying the lateral aspect of the right lower lung zone. Bilateral lung fields are otherwise clear. No dense consolidation or lung collapse. Bilateral costophrenic angles are clear. Normal cardio-mediastinal silhouette. No acute osseous abnormalities. Lower cervical spinal fixation hardware noted. The soft tissues are within normal limits. IMPRESSION: *Nonspecific opacities overlying the lateral aspect of the right lower lung zone these may represent focal pneumonitis versus atelectasis. Follow-up to clearing is recommended. Electronically Signed   By: Beula Brunswick M.D.   On: 09/04/2023 17:33      Subjective: Seen and examined on the day of discharge.  Stable no distress.  Appropriate for discharge home.  Discharge Exam: Vitals:   09/09/23 0348 09/09/23 0815  BP: 110/60 131/69  Pulse: 82 72  Resp: 18 17  Temp: (!) 97.5 F (36.4 C) 98 F (36.7 C)  SpO2: 94% 95%   Vitals:   09/08/23 2046 09/08/23 2104 09/09/23 0348 09/09/23 0815  BP:  121/68 110/60  131/69  Pulse:  88 82 72  Resp:  19 18 17   Temp:  98 F (36.7 C) (!) 97.5 F (36.4 C) 98 F (36.7 C)  TempSrc:    Oral  SpO2: 96% 93% 94% 95%  Weight:      Height:        General: Pt is alert, awake, not in acute distress Cardiovascular: RRR, S1/S2 +, no rubs, no gallops Respiratory: CTA  bilaterally, no wheezing, no rhonchi Abdominal: Soft, NT, ND, bowel sounds + Extremities: no edema, no cyanosis    The results of significant diagnostics from this hospitalization (including imaging, microbiology, ancillary and laboratory) are listed below for reference.     Microbiology: Recent Results (from the past 240 hours)  Resp panel by RT-PCR (RSV, Flu A&B, Covid) Anterior Nasal Swab     Status: None   Collection Time: 09/04/23  6:38 PM   Specimen: Anterior Nasal Swab  Result Value Ref Range Status   SARS Coronavirus 2 by RT PCR NEGATIVE NEGATIVE Final    Comment: (NOTE) SARS-CoV-2 target nucleic acids are NOT DETECTED.  The SARS-CoV-2 RNA is generally detectable in upper respiratory specimens during the acute phase of infection. The lowest concentration of SARS-CoV-2 viral copies this assay can detect is 138 copies/mL. A negative result does not preclude SARS-Cov-2 infection and should not be used as the sole basis for treatment or other patient management decisions. A negative result may occur with  improper specimen collection/handling, submission of specimen other than nasopharyngeal swab, presence of viral mutation(s) within the areas targeted by this assay, and inadequate number of viral copies(<138 copies/mL). A negative result must be combined with clinical observations, patient history, and epidemiological information. The expected result is Negative.  Fact Sheet for Patients:  BloggerCourse.com  Fact Sheet for Healthcare Providers:  SeriousBroker.it  This test is no t yet approved or cleared by the [place]  FDA and  has been authorized for detection and/or diagnosis of SARS-CoV-2 by FDA under an Emergency Use Authorization (EUA). This EUA will remain  in effect (meaning this test can be used) for the duration of the COVID-19 declaration under Section 564(b)(1) of the Act, 21 U.S.C.section  360bbb-3(b)(1), unless the authorization is terminated  or revoked sooner.       Influenza A by PCR NEGATIVE NEGATIVE Final   Influenza B by PCR NEGATIVE NEGATIVE Final    Comment: (NOTE) The Xpert Xpress SARS-CoV-2/FLU/RSV plus assay is intended as an aid in the diagnosis of influenza from Nasopharyngeal swab specimens and should not be used as a sole basis for treatment. Nasal washings and aspirates are unacceptable for Xpert Xpress SARS-CoV-2/FLU/RSV testing.  Fact Sheet for Patients: BloggerCourse.com  Fact Sheet for Healthcare Providers: SeriousBroker.it  This test is not yet approved or cleared by the [place]  FDA and has been authorized for detection and/or diagnosis of SARS-CoV-2 by FDA under an Emergency Use Authorization (EUA). This EUA will remain in effect (meaning this test can be used) for the duration of the COVID-19 declaration under Section 564(b)(1) of the Act, 21 U.S.C. section 360bbb-3(b)(1), unless the authorization is terminated or revoked.     Resp Syncytial Virus by PCR NEGATIVE NEGATIVE Final    Comment: (NOTE) Fact Sheet for Patients: BloggerCourse.com  Fact Sheet for Healthcare Providers: SeriousBroker.it  This test is not yet approved or cleared by the [place]  FDA and has been authorized for detection and/or diagnosis of SARS-CoV-2 by FDA under an Emergency Use Authorization (EUA). This EUA will remain  in effect (meaning this test can be used) for the duration of the COVID-19 declaration under Section 564(b)(1) of the Act, 21 U.S.C. section 360bbb-3(b)(1), unless the authorization is terminated or revoked.  Performed at [facility], [address] [place], [place] [address]      Labs: BNP (last 3 results) No results for input(s): "BNP" in the last 8760 hours. Basic Metabolic Panel: Recent Labs  Lab 09/04/23 1640  09/05/23 0539 09/06/23 0242 09/07/23 0821  NA 136 133* 138 143  K 3.6 4.0 4.2 3.5  CL 101 101 104 102  CO2 26 23 25 30   GLUCOSE 128* 246* 183* 112*  BUN 14 17 21 19   CREATININE 1.02* 1.21* 0.91 0.89  CALCIUM  9.2 8.7* 8.6* 8.8*   Liver Function Tests: Recent Labs  Lab 09/04/23 1640  AST 17  ALT 17  ALKPHOS 66  BILITOT 0.9  PROT 7.2  ALBUMIN 4.1   Recent Labs  Lab 09/04/23 1640  LIPASE 22   No results for input(s): "AMMONIA" in the last 168 hours. CBC: Recent Labs  Lab 09/04/23 1640 09/05/23 0539 09/06/23 0242 09/07/23 0821  WBC 10.2 10.6* 14.8* 9.1  NEUTROABS  --   --   --  5.9  HGB 10.3* 9.5* 9.1* 9.0*  HCT 31.3* 28.1* 27.6* 26.9*  MCV 114.7* 115.6* 115.5* 114.0*  PLT 106* 86* 94* 93*   Cardiac Enzymes: No results for input(s): "CKTOTAL", "CKMB", "CKMBINDEX", "TROPONINI" in the last 168 hours. BNP: Invalid input(s): "POCBNP" CBG: No results for input(s): "GLUCAP" in the last 168 hours. D-Dimer No results for input(s): "DDIMER" in the last 72 hours. Hgb A1c No results for input(s): "HGBA1C" in the last 72 hours. Lipid Profile No results for input(s): "CHOL", "HDL", "LDLCALC", "TRIG", "CHOLHDL", "LDLDIRECT" in the last 72 hours. Thyroid  function studies No results for input(s): "TSH", "T4TOTAL", "T3FREE", "THYROIDAB" in the last 72 hours.  Invalid input(s): "FREET3" Anemia work up No results for input(s): "VITAMINB12", "FOLATE", "FERRITIN", "TIBC", "IRON", "RETICCTPCT" in the last 72 hours. Urinalysis    Component Value Date/Time   COLORURINE AMBER (A) 09/04/2023 1640   APPEARANCEUR HAZY (A) 09/04/2023 1640   LABSPEC 1.028 09/04/2023 1640   PHURINE 5.0 09/04/2023 1640   GLUCOSEU NEGATIVE 09/04/2023 1640   GLUCOSEU NEGATIVE 05/18/2022 1424   HGBUR NEGATIVE 09/04/2023 1640   BILIRUBINUR NEGATIVE 09/04/2023 1640   BILIRUBINUR negative 05/05/2023 0922   KETONESUR 20 (A) 09/04/2023 1640   PROTEINUR 100 (A) 09/04/2023 1640   UROBILINOGEN 0.2  05/05/2023 0922   UROBILINOGEN 0.2 05/18/2022 1424   NITRITE NEGATIVE 09/04/2023 1640   LEUKOCYTESUR SMALL (A) 09/04/2023 1640   Sepsis Labs Recent Labs  Lab 09/04/23 1640 09/05/23 0539 09/06/23 0242 09/07/23 0821  WBC 10.2 10.6* 14.8* 9.1   Microbiology Recent Results (from the past 240 hours)  Resp panel by RT-PCR (RSV, Flu A&B, Covid) Anterior Nasal Swab     Status: None   Collection Time: 09/04/23  6:38 PM   Specimen: Anterior Nasal Swab  Result Value Ref Range Status   SARS Coronavirus 2 by RT PCR NEGATIVE NEGATIVE Final    Comment: (NOTE) SARS-CoV-2 target nucleic acids are NOT DETECTED.  The SARS-CoV-2 RNA is generally detectable in upper respiratory specimens during the acute phase of infection. The lowest concentration of SARS-CoV-2 viral copies this assay can detect is 138 copies/mL. A negative result does not preclude SARS-Cov-2 infection and should not be used as the sole basis for treatment or other patient management decisions. A negative result  may occur with  improper specimen collection/handling, submission of specimen other than nasopharyngeal swab, presence of viral mutation(s) within the areas targeted by this assay, and inadequate number of viral copies(<138 copies/mL). A negative result must be combined with clinical observations, patient history, and epidemiological information. The expected result is Negative.  Fact Sheet for Patients:  BloggerCourse.com  Fact Sheet for Healthcare Providers:  SeriousBroker.it  This test is no t yet approved or cleared by the [place]  FDA and  has been authorized for detection and/or diagnosis of SARS-CoV-2 by FDA under an Emergency Use Authorization (EUA). This EUA will remain  in effect (meaning this test can be used) for the duration of the COVID-19 declaration under Section 564(b)(1) of the Act, 21 U.S.C.section 360bbb-3(b)(1), unless the authorization is  terminated  or revoked sooner.       Influenza A by PCR NEGATIVE NEGATIVE Final   Influenza B by PCR NEGATIVE NEGATIVE Final    Comment: (NOTE) The Xpert Xpress SARS-CoV-2/FLU/RSV plus assay is intended as an aid in the diagnosis of influenza from Nasopharyngeal swab specimens and should not be used as a sole basis for treatment. Nasal washings and aspirates are unacceptable for Xpert Xpress SARS-CoV-2/FLU/RSV testing.  Fact Sheet for Patients: BloggerCourse.com  Fact Sheet for Healthcare Providers: SeriousBroker.it  This test is not yet approved or cleared by the [place]  FDA and has been authorized for detection and/or diagnosis of SARS-CoV-2 by FDA under an Emergency Use Authorization (EUA). This EUA will remain in effect (meaning this test can be used) for the duration of the COVID-19 declaration under Section 564(b)(1) of the Act, 21 U.S.C. section 360bbb-3(b)(1), unless the authorization is terminated or revoked.     Resp Syncytial Virus by PCR NEGATIVE NEGATIVE Final    Comment: (NOTE) Fact Sheet for Patients: BloggerCourse.com  Fact Sheet for Healthcare Providers: SeriousBroker.it  This test is not yet approved or cleared by the [place]  FDA and has been authorized for detection and/or diagnosis of SARS-CoV-2 by FDA under an Emergency Use Authorization (EUA). This EUA will remain in effect (meaning this test can be used) for the duration of the COVID-19 declaration under Section 564(b)(1) of the Act, 21 U.S.C. section 360bbb-3(b)(1), unless the authorization is terminated or revoked.  Performed at [facility], [address], [place], [place] [address]      Time coordinating discharge: Over 30 minutes  SIGNED:   Tiajuana Fluke, MD  [facility] 09/09/2023, 11:27 AM Pager   If 7PM-7AM, please contact night-coverage

## 2023-09-09 NOTE — Progress Notes (Signed)
 Patient discharged home. Writer reviewed home care instructions with patient and has no further questions at this time. Patient states she has all belongings and her ride will arrive around 1300. Patient to be transported to front lobby via wheelchair by staff when ride arrives.

## 2023-09-09 NOTE — Progress Notes (Signed)
 Occupational Therapy Evaluation Patient Details Name: Sarah [name] MRN: [id number] DOB: Nov 01, 1950 Today's Date: 09/09/2023   History of Present Illness   72 y/o female presented to ED on 09/04/23 for abdominal pain and vomitting. Admitted for acute hypoxic respiratory failure 2/2 COPD exacerbation. PMH: HTN, GERD, HTN, depression, OSA     Clinical Impressions Pt was seen for OT evaluation this date. Prior to hospital admission, pt was indep in all ADL/IADLs, no DME use PTA. Pt lives with her roommate in mobile home with 4 steps to enter. Pt presents to acute OT presenting not far from her reported baseline. Pt currently completed bed mobility with MODI and transfers with supervision from multiple surfaces. Pt donned/doffed gown and bilateral LE socks with MODI sitting/lateral leans, no physical assistance required to complete. Pt amb within room and 1 lap around the nurses station with no DME, occasionally reaching for side rail, took 2 standing breaks to work on pursed lip breathing techniques. 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 During amb pt Spo2 >90% throughout. Pt would benefit from skilled OT services to address noted impairments and functional limitations (see below for any additional details) in order to maximize safety and independence while minimizing falls risk and caregiver burden. Do not anticipate the need for follow up OT services upon acute hospital DC. OT will follow acutely.     If plan is discharge home, recommend the following:   A little help with walking and/or transfers     Functional Status Assessment   Patient has had a recent decline in their functional status and demonstrates the ability to make significant improvements in function in a reasonable and predictable amount of time.     Equipment Recommendations   None recommended by OT     Recommendations for Other Services         Precautions/Restrictions    Precautions Precautions: None Restrictions Weight Bearing Restrictions Per Provider Order: No     Mobility Bed Mobility Overal bed mobility: Modified Independent                  Transfers Overall transfer level: Supervision Equipment used: None                      Balance Overall balance assessment: Needs assistance Sitting-balance support: Feet supported, No upper extremity supported Sitting balance-Leahy Scale: Good Sitting balance - Comments: Steady reaching outside BOS   Standing balance support: No upper extremity supported, During functional activity Standing balance-Leahy Scale: Fair                             ADL either performed or assessed with clinical judgement   ADL Overall ADL's : Needs assistance/impaired                 Upper Body Dressing : MODI   Lower Body Dressing: Sitting/lateral leans;MODI   Toilet Transfer: Ambulation (Simulated toilet t/f)           Functional mobility during ADLs: Supervision/safety General ADL Comments: MODI for seated ADLs and close montioring of oxygen levels required     Vision Baseline Vision/History: 1 Wears glasses       Perception         Praxis         Pertinent Vitals/Pain Pain Assessment Pain Assessment: No/denies pain     Extremity/Trunk Assessment Upper Extremity Assessment Upper  Extremity Assessment: Defer to OT evaluation   Lower Extremity Assessment Lower Extremity Assessment: Generalized weakness   Cervical / Trunk Assessment Cervical / Trunk Assessment: Normal   Communication Communication Communication: No apparent difficulties   Cognition Arousal: Alert Behavior During Therapy: WFL for tasks assessed/performed Cognition: No apparent impairments             OT - Cognition Comments: A/Ox4                 Following commands: Intact       Cueing  General Comments   Cueing Techniques: Verbal cues  Ambulated on RA with SpO2  >90% throughout. Instructed on PLB when SOB   Exercises Exercises: Other exercises Other Exercises Other Exercises: Edu: Role of OT eval, safe ADL completion, energy conservation techniques , pursed lip breathing   Shoulder Instructions      Home Living Family/patient expects to be discharged to:: Private residence Living Arrangements: Non-relatives/Friends Available Help at Discharge: Available PRN/intermittently;Available 24 hours/day;Neighbor;Friend(s) Type of Home: Mobile home Home Access: Stairs to enter Entrance Stairs-Number of Steps: 4 Entrance Stairs-Rails: Can reach both;Left;Right Home Layout: One level     Bathroom Shower/Tub: Tub/shower unit;Walk-in shower   Bathroom Toilet: Handicapped height Bathroom Accessibility: Yes How Accessible: Accessible via walker Home Equipment: Rolling Walker (2 wheels);Toilet riser;Cane - single point;Cane - quad;Shower seat;Grab bars - tub/shower          Prior Functioning/Environment Prior Level of Function : Independent/Modified Independent;Driving                    OT Problem List: Decreased activity tolerance;Impaired balance (sitting and/or standing);Decreased safety awareness   OT Treatment/Interventions: Self-care/ADL training;Energy conservation;DME and/or AE instruction;Therapeutic activities      OT Goals(Current goals can be found in the care plan section)   Acute Rehab OT Goals Patient Stated Goal: Return home OT Goal Formulation: With patient Time For Goal Achievement: 09/23/23 Potential to Achieve Goals: Good ADL Goals Pt Will Perform Grooming: Independently;standing Pt Will Perform Lower Body Dressing: Independently;sitting/lateral leans Pt Will Transfer to Toilet: Independently;regular height toilet Pt Will Perform Toileting - Clothing Manipulation and hygiene: Independently;sit to/from stand   OT Frequency:  Min 1X/week    Co-evaluation              AM-PAC OT "6 Clicks" Daily Activity      Outcome Measure Help from another person eating meals?: None Help from another person taking care of personal grooming?: None Help from another person toileting, which includes using toliet, bedpan, or urinal?: A Little Help from another person bathing (including washing, rinsing, drying)?: None Help from another person to put on and taking off regular upper body clothing?: None Help from another person to put on and taking off regular lower body clothing?: None 6 Click Score: 23   End of Session Equipment Utilized During Treatment: Gait belt Nurse Communication: Mobility status  Activity Tolerance: Patient tolerated treatment well Patient left: in chair;with call bell/phone within reach;with chair alarm set  OT Visit Diagnosis: Other abnormalities of gait and mobility (R26.89)                Time: 1610-9604 OT Time Calculation (min): 24 min Charges:  OT General Charges $OT Visit: 1 Visit OT Evaluation $OT Eval Low Complexity: 1 Low OT Treatments $Therapeutic Activity: 8-22 mins  Rosaria Common M.S. OTR/L  09/09/23, 11:52 AM

## 2023-09-09 NOTE — Plan of Care (Signed)

## 2023-09-09 NOTE — Evaluation (Signed)
 Physical Therapy Evaluation Patient Details Name: Sarah [name] MRN: [id number] DOB: 1951/03/04 Today's Date: 09/09/2023  History of Present Illness  73 y/o female presented to ED on 09/04/23 for abdominal pain and vomitting. Admitted for acute hypoxic respiratory failure 2/2 COPD exacerbation. PMH: HTN, GERD, HTN, depression, OSA  Clinical Impression  Patient admitted with the above. PTA, patient was independent and an occasional Careers adviser. Patient functioning at baseline with mild balance deficits with no AD. Ambulated on RA with spO2 >90% throughout. Able to negotiate 4 stairs with B handrails to simulate home environment. No further skilled PT needs identified. No PT follow up recommended at this time. PT will sign off.       If plan is discharge home, recommend the following: Assist for transportation;Help with stairs or ramp for entrance   Can travel by private vehicle        Equipment Recommendations None recommended by PT  Recommendations for Other Services       Functional Status Assessment Patient has not had a recent decline in their functional status     Precautions / Restrictions Precautions Precautions: Fall Recall of Precautions/Restrictions: Intact Restrictions Weight Bearing Restrictions Per Provider Order: No      Mobility  Bed Mobility Overal bed mobility: Modified Independent                  Transfers Overall transfer level: Modified independent Equipment used: None                    Ambulation/Gait Ambulation/Gait assistance: Modified independent (Device/Increase time) Gait Distance (Feet): 200 Feet Assistive device: None Gait Pattern/deviations: Step-through pattern, Decreased stride length, Wide base of support Gait velocity: decreased     General Gait Details: general unsteadiness but no overt LOB  Stairs Stairs: Yes Stairs assistance: Modified independent (Device/Increase time) Stair Management: Alternating  pattern, Two rails, Forwards Number of Stairs: 4    Wheelchair Mobility     Tilt Bed    Modified Rankin (Stroke Patients Only)       Balance Overall balance assessment: Mild deficits observed, not formally tested                                           Pertinent Vitals/Pain Pain Assessment Pain Assessment: No/denies pain    Home Living Family/patient expects to be discharged to:: Private residence Living Arrangements: Non-relatives/Friends Available Help at Discharge: Available PRN/intermittently;Available 24 hours/day;Neighbor;Friend(s) Type of Home: Mobile home Home Access: Stairs to enter Entrance Stairs-Rails: Can reach both;Left;Right Entrance Stairs-Number of Steps: 4   Home Layout: One level Home Equipment: Agricultural consultant (2 wheels);Toilet riser;Cane - single point;Cane - quad;Shower seat;Grab bars - tub/shower      Prior Function Prior Level of Function : Independent/Modified Independent;Driving                     Extremity/Trunk Assessment   Upper Extremity Assessment Upper Extremity Assessment: Defer to OT evaluation    Lower Extremity Assessment Lower Extremity Assessment: Generalized weakness    Cervical / Trunk Assessment Cervical / Trunk Assessment: Normal  Communication   Communication Communication: No apparent difficulties    Cognition Arousal: Alert Behavior During Therapy: WFL for tasks assessed/performed   PT - Cognitive impairments: No apparent impairments  Following commands: Intact       Cueing       General Comments General comments (skin integrity, edema, etc.): Ambulated on RA with SpO2 >90% throughout. Instructed on PLB when SOB    Exercises     Assessment/Plan    PT Assessment Patient does not need any further PT services  PT Problem List         PT Treatment Interventions      PT Goals (Current goals can be found in the Care Plan section)   Acute Rehab PT Goals Patient Stated Goal: to go home PT Goal Formulation: All assessment and education complete, DC therapy    Frequency       Co-evaluation               AM-PAC PT "6 Clicks" Mobility  Outcome Measure Help needed turning from your back to your side while in a flat bed without using bedrails?: None Help needed moving from lying on your back to sitting on the side of a flat bed without using bedrails?: None Help needed moving to and from a bed to a chair (including a wheelchair)?: None Help needed standing up from a chair using your arms (e.g., wheelchair or bedside chair)?: None Help needed to walk in hospital room?: None Help needed climbing 3-5 steps with a railing? : None 6 Click Score: 24    End of Session   Activity Tolerance: Patient tolerated treatment well Patient left: in bed;with call bell/phone within reach;with bed alarm set Nurse Communication: Mobility status PT Visit Diagnosis: Muscle weakness (generalized) (M62.81)    Time: 1308-6578 PT Time Calculation (min) (ACUTE ONLY): 13 min   Charges:   PT Evaluation $PT Eval Moderate Complexity: 1 Mod   PT General Charges $$ ACUTE PT VISIT: 1 Visit         Janine Melbourne, PT, DPT Physical Therapist - [facility]   Sarah [name] 09/09/2023, 11:28 AM

## 2023-09-11 ENCOUNTER — Telehealth: Payer: Self-pay

## 2023-09-11 NOTE — Telephone Encounter (Signed)
 Referral was marked received on 08/03/23 for new mask supplies. Pt reports never getting them and being told by [employer] they never got the referral.

## 2023-09-11 NOTE — Telephone Encounter (Signed)
 Copied from CRM [id number]. Topic: Clinical - Order For Equipment >> Sep 11, 2023  3:59 PM Sarah [name] wrote: Reason for CRM: Patient is requesting to speak to Dr.Gonzalez' nurse in regard to the mask and tubing for her machine from [facility] - she keeps on getting the round around, [facility] states they never received the order.

## 2023-09-11 NOTE — Transitions of Care (Post Inpatient/ED Visit) (Signed)
   09/11/2023  Name: Sarah [name] MRN: [id number] DOB: 1950/08/04  Today's TOC FU Call Status: Today's TOC FU Call Status:: Unsuccessful Call (1st Attempt) Unsuccessful Call (1st Attempt) Date: 09/11/23  Attempted to reach the patient regarding the most recent Inpatient/ED visit.  Follow Up Plan: Additional outreach attempts will be made to reach the patient to complete the Transitions of Care (Post Inpatient/ED visit) call.   [name], BSN, RN [place]  VBCI - [facility] RN Care Manager [phone]

## 2023-09-12 ENCOUNTER — Telehealth: Payer: Self-pay

## 2023-09-12 NOTE — Transitions of Care (Post Inpatient/ED Visit) (Addendum)
 Transition of Care Initial Successful Outreach  Visit Note  09/12/2023  Name: Sarah [name] MRN: [id number]          DOB: 1950-05-29  Situation: Patient enrolled in [facility] 30-day program. Visit completed with patient by telephone.   Background:   Initial Transition Care Management Follow-up Telephone Call Date of Discharge: 09/12/23 Discharge Facility: [facility]) Type of Discharge: Inpatient Admission Primary Inpatient Discharge Diagnosis:: Pnuemonia How have you been since you were released from the hospital?: Better Any questions or concerns?: No (Except for supplies pending from [employer] [facility] for CPAP mask and tubing.)  Past Medical History:  Diagnosis Date   Arthritis    Asthma    CAP (community acquired pneumonia) 08/11/2022   Depression    Dyspnea    with heat   Environmental and seasonal allergies    GERD (gastroesophageal reflux disease)    Hyperlipidemia    Hypertension    Hypothyroidism    Presence of dental prosthetic device    Dental implants   Wheezing     Assessment: Patient Reported Symptoms: Cognitive Cognitive Status: Able to follow simple commands, Alert and oriented to person, place, and time      Neurological    Not assessed this call   HEENT    Not assessed this call     Cardiovascular Cardiovascular Symptoms Reported: No symptoms reported Cardiovascular Comment: Does not feel BP home cuff works.  Respiratory Respiratory Symptoms Reported: No symptoms reported Other Respiratory Symptoms: Has CPAP    Endocrine Patient reports the following symptoms related to hypoglycemia or hyperglycemia : Not assessed Is patient diabetic?: No    Gastrointestinal    Not assessed this call     Genitourinary    Not assessed this call   Integumentary    Not assessed this call   Musculoskeletal    Not assessed this call       Psychosocial No symptoms reported.          There were no vitals filed for this visit.There  were no vitals filed for this visit. Last vitals from hospital. Patient has BP monitor but does not trust it per patient statement. Has a SPO2 monitor she will use if becomes short of breath.   Medications Reviewed Today     Reviewed by Areta Beer, RN (Case Manager) on 09/12/23 at 1213  Med List Status: <None>   Medication Order Taking? Sig Documenting Provider Last Dose Status Informant  albuterol  (VENTOLIN  HFA) 108 (90 Base) MCG/ACT inhaler [id number] Yes Inhale 1-2 puffs into the lungs every 6 (six) hours as needed for wheezing or shortness of breath. Thersia Flax, MD Taking Active            Med Note Janise Melia Sep 04, 2023 10:04 PM) PRN  amLODipine  (NORVASC ) 5 MG tablet [id number] Yes TAKE 1 TABLET BY MOUTH DAILY Tullo, Teresa L, MD Taking Active   azithromycin  (ZITHROMAX ) 250 MG tablet [id number] Yes Take 1 tablet (250 mg total) by mouth 3 (three) times a week. M-W-F Marc Senior, MD Taking Active   benzonatate  (TESSALON ) 200 MG capsule [id number] Yes Take 1 capsule (200 mg total) by mouth 3 (three) times daily as needed for cough. Tiajuana Fluke, MD Taking Active   Calcium  Citrate-Vitamin D  (CALCIUM  CITRATE + D PO) [id number] Yes Take 1 tablet by mouth 2 (two) times daily. [provider] Taking Active Self, Pharmacy Records  citalopram  (CELEXA ) 20 MG tablet  [id number] Yes TAKE 1 TABLET BY MOUTH DAILY Tullo, Teresa L, MD Taking Active   famotidine  (PEPCID ) 20 MG tablet [id number] Yes TAKE 1 TABLET BY MOUTH DAILY. BEFORE DINNER Thersia Flax, MD Taking Active Self, Pharmacy Records  ferrous sulfate  324 MG TBEC [id number] Yes Take 2 tablets by mouth. 65 mg of elemental iron [provider] Taking Active Self, Pharmacy Records  fexofenadine (ALLEGRA) 180 MG tablet [id number] Yes Take 180 mg by mouth daily. [provider] Taking Active Self, Pharmacy Records  Fluticasone -Umeclidin-Vilant (TRELEGY ELLIPTA ) 200-62.5-25 MCG/ACT AEPB [id number]  Yes Inhale 1 puff into the lungs daily. Marc Senior, MD Taking Active   gabapentin  (NEURONTIN ) 100 MG capsule [id number] Yes TAKE 1 CAPSULE BY MOUTH 3 TIMES  DAILY Thersia Flax, MD Taking Active   levothyroxine  (SYNTHROID ) 50 MCG tablet [id number] Yes TAKE 1 TABLET BY MOUTH DAILY ON  AN EMPTY STOMACH WITH A GLASS OF WATER AT LEAST 30 TO 60 MINUTES  BEFORE BREAKFAST Thersia Flax, MD Taking Active   pantoprazole  (PROTONIX ) 40 MG tablet [id number] Yes TAKE 1 TABLET BY MOUTH DAILY Tullo, Teresa L, MD Taking Active   predniSONE  (DELTASONE ) 20 MG tablet [id number] Yes Take 2 tablets (40 mg total) by mouth daily with breakfast for 5 days. Tiajuana Fluke, MD Taking Active   rosuvastatin  (CRESTOR ) 10 MG tablet [id number] Yes TAKE ONE TABLET BY MOUTH ONCE DAILY Thersia Flax, MD Taking Active   solifenacin  (VESICARE ) 5 MG tablet [id number] Yes TAKE ONE TABLET BY MOUTH EVERY DAY Thersia Flax, MD Taking Active   traZODone  (DESYREL ) 50 MG tablet [id number] Yes TAKE 1/2 TO 1 TABLET BY MOUTH AT BEDTIME AS NEEDED FOR SLEEP Thersia Flax, MD Taking Active   vitamin B-12 (CYANOCOBALAMIN ) 1000 MCG tablet [id number] No Take 1,000 mcg by mouth daily.  Patient not taking: Reported on 09/12/2023   [provider] Not Taking Active Self, Pharmacy Records           Med Note Parke Boll, Morrell Aran   Tue Sep 12, 2023 12:12 PM) Patient stated hospitalist provider told patient to stop taking as levels were high.             Recommendation:   PCP Follow-up DME requests:  other CPAP needs follow up via [facility] post provider referral.   Follow Up Plan:   Telephone follow-up in 1 week 09/20/23 at 1130 am.  Attempt to contact [facility] regarding CPAP equipment needs per provider referral.  Left SECURE Voice Mail with Lenon Radar at [employer] [facility] at [phone] regarding contacting the patient for assistance obtaining needed supplies for CPAP Following a PCP provider referral in April 2025.     Katheryn Pandy MSN, RN RN Case Sales executive Health  VBCI-Population Health Office Hours M-F [phone] Direct Dial: [phone] Main Phone [phone]  Fax: [phone] [email or web address]

## 2023-09-12 NOTE — Telephone Encounter (Signed)
 I received a message from [place] with [employer] [facility], Dariel Edelson   This is a [employer] patient and she will have to call [employer] for details. We sent over the order to [employer] on 09-04-2023 and voided the order on our end.  [employer] [phone] or email at [email or web address]

## 2023-09-12 NOTE — Telephone Encounter (Signed)
 Copied from CRM [id number]. Topic: General - Other >> Sep 12, 2023  4:44 PM Alverda Joe S wrote: Reason for CRM: tina from [employer] [facility] is calling to have a new cpap supplies order faxed to them, as well as, updated face to face notes and any test results faxed.

## 2023-09-12 NOTE — Telephone Encounter (Signed)
 I had sent an urgent message to [employer] 09/11/23

## 2023-09-12 NOTE — Patient Instructions (Signed)
 Visit Information  Thank you for taking time to visit with me today. Please don't hesitate to contact me if I can be of assistance to you before our next scheduled telephone appointment.  Our next appointment is by telephone on 09/20/23 at 1130 am  Following is a copy of your care plan:   Goals Addressed             This Visit's Progress    VBCI Transitions of Care (TOC) Care Plan       Problems:  Recent Hospitalization for treatment of COPD and pneumonia Equipment/DME barrier [facility] has not delivered CPAP mask and tubing per PCP provider orders per patient.   Goal:  Over the next 30 days, the patient will not experience hospital readmission  Interventions:  Transitions of Care: Doctor Visits  - discussed the importance of doctor visits Contacted DME company for patient use of equipment  Patient Self Care Activities:  Attend all scheduled provider appointments Call pharmacy for medication refills 3-7 days in advance of running out of medications Call provider office for new concerns or questions  Notify RN Care Manager of TOC call rescheduling needs Participate in Transition of Care Program/Attend TOC scheduled calls Take medications as prescribed    Plan:  Next PCP appointment scheduled for:  Telephone follow up appointment with care management team member scheduled for:  09/20/23 at 1130 am        Patient verbalizes understanding of instructions and care plan provided today and agrees to view in MyChart. Active MyChart status and patient understanding of how to access instructions and care plan via MyChart confirmed with patient.     Telephone follow up appointment with care management team member scheduled for:  Please call the care guide team at [phone] if you need to cancel or reschedule your appointment.   Please call the [place]  National Suicide Prevention Lifeline: (563) 686-6842 or TTY: 617-717-9631 TTY 585-831-0044) to talk to a trained  counselor call 1-800-273-TALK (toll free, 24 hour hotline) if you are experiencing a Mental Health or Behavioral Health Crisis or need someone to talk to.   Katheryn Pandy MSN, RN RN Case Sales executive Health  VBCI-Population Health Office Hours M-F [phone] Direct Dial: [phone] Main Phone [phone]  Fax: [phone] [email or web address]

## 2023-09-12 NOTE — Telephone Encounter (Signed)
 I called Sarah [name] and told her what [employer] told me that she would need to call [employer] at [phone]. Mrs. Hurless stated she was on the phone with someone all day and she gave the number but I wasn't familiar with the number she gave me.  She stated she would call [employer] right now

## 2023-09-13 NOTE — Telephone Encounter (Signed)
 HFU is scheduled for 10/13/2023.

## 2023-09-14 NOTE — Telephone Encounter (Signed)
 I have faxed the notes for 2025, sleep study and compliance report to [facility]

## 2023-09-19 ENCOUNTER — Encounter: Payer: Self-pay | Admitting: Pulmonary Disease

## 2023-09-20 ENCOUNTER — Other Ambulatory Visit: Payer: Self-pay

## 2023-09-20 NOTE — Transitions of Care (Post Inpatient/ED Visit) (Signed)
 Transition of Care week 2  Visit Note  09/20/2023  Name: Sarah [name] MRN: [id number]          DOB: 10/19/1950  Situation: Patient enrolled in [facility] 30-day program. Visit completed with Elmarie Hacking by telephone.   Background:     Past Medical History:  Diagnosis Date   Arthritis    Asthma    CAP (community acquired pneumonia) 08/11/2022   Depression    Dyspnea    with heat   Environmental and seasonal allergies    GERD (gastroesophageal reflux disease)    Hyperlipidemia    Hypertension    Hypothyroidism    Presence of dental prosthetic device    Dental implants   Wheezing     Assessment: Patient Reported Symptoms: Cognitive Cognitive Status: Alert and oriented to person, place, and time      Neurological Neurological Review of Symptoms: No symptoms reported    HEENT HEENT Symptoms Reported: No symptoms reported      Cardiovascular Cardiovascular Symptoms Reported: No symptoms reported Weight: 184 lb (83.5 kg) Cardiovascular Self-Management Outcome: 4 (good)  Respiratory Respiratory Symptoms Reported: Productive cough Other Respiratory Symptoms: Waiting for her CPAP supplies Additional Respiratory Details: Has been feeling poorly and mostly on the couch until yesterday Respiratory Conditions: Asthma, Sleep disordered breathing, Cough Respiratory Self-Management Outcome: 3 (uncertain) Respiratory Comment: Hosptialized for Pneumonia. Still coughing up some dark covered sputum  Endocrine Patient reports the following symptoms related to hypoglycemia or hyperglycemia : No symptoms reported Is patient diabetic?: No    Gastrointestinal Gastrointestinal Symptoms Reported: No symptoms reported   Nutrition Risk Screen (CP): No indicators present  Genitourinary Genitourinary Symptoms Reported: No symptoms reported    Integumentary Integumentary Symptoms Reported: No symptoms reported    Musculoskeletal Musculoskelatal Symptoms Reviewed: No symptoms reported    Falls in the past year?: No Number of falls in past year: 1 or less Was there an injury with Fall?: No Fall Risk Category Calculator: 0 Patient Fall Risk Level: Low Fall Risk Patient at Risk for Falls Due to: History of fall(s) Fall risk Follow up: Falls evaluation completed  Psychosocial Psychosocial Symptoms Reported: No symptoms reported         There were no vitals filed for this visit.  Medications Reviewed Today     Reviewed by Claudene Crystal, RN (Case Manager) on 09/20/23 at 1134  Med List Status: <None>   Medication Order Taking? Sig Documenting Provider Last Dose Status Informant  albuterol  (VENTOLIN  HFA) 108 (90 Base) MCG/ACT inhaler [id number]  Inhale 1-2 puffs into the lungs every 6 (six) hours as needed for wheezing or shortness of breath. Thersia Flax, MD  Active            Med Note Janise Melia Sep 04, 2023 10:04 PM) PRN  amLODipine  (NORVASC ) 5 MG tablet [id number]  TAKE 1 TABLET BY MOUTH DAILY Tullo, Teresa L, MD  Active   azithromycin  (ZITHROMAX ) 250 MG tablet [id number] No Take 1 tablet (250 mg total) by mouth 3 (three) times a week. M-W-F  Patient not taking: Reported on 09/20/2023   Marc Senior, MD Not Taking Active   benzonatate  (TESSALON ) 200 MG capsule [id number]  Take 1 capsule (200 mg total) by mouth 3 (three) times daily as needed for cough. Tiajuana Fluke, MD  Active   Calcium  Citrate-Vitamin D  (CALCIUM  CITRATE + D PO) [id number]  Take 1 tablet by mouth 2 (two) times daily. [provider]  Active Self, Pharmacy  Records  citalopram  (CELEXA ) 20 MG tablet [id number]  TAKE 1 TABLET BY MOUTH DAILY Tullo, Teresa L, MD  Active   famotidine  (PEPCID ) 20 MG tablet [id number]  TAKE 1 TABLET BY MOUTH DAILY. BEFORE DINNER Thersia Flax, MD  Active Self, Pharmacy Records  ferrous sulfate  324 MG TBEC [id number]  Take 2 tablets by mouth. 65 mg of elemental iron [provider]  Active Self, Pharmacy Records  fexofenadine  (ALLEGRA) 180 MG tablet [id number]  Take 180 mg by mouth daily. [provider]  Active Self, Pharmacy Records  Fluticasone -Umeclidin-Vilant (TRELEGY ELLIPTA ) 200-62.5-25 MCG/ACT AEPB [id number]  Inhale 1 puff into the lungs daily. Marc Senior, MD  Active   gabapentin  (NEURONTIN ) 100 MG capsule [id number]  TAKE 1 CAPSULE BY MOUTH 3 TIMES  DAILY Thersia Flax, MD  Active   levothyroxine  (SYNTHROID ) 50 MCG tablet [id number]  TAKE 1 TABLET BY MOUTH DAILY ON  AN EMPTY STOMACH WITH A GLASS OF WATER AT LEAST 30 TO 60 MINUTES  BEFORE BREAKFAST Thersia Flax, MD  Active   pantoprazole  (PROTONIX ) 40 MG tablet [id number]  TAKE 1 TABLET BY MOUTH DAILY Tullo, Teresa L, MD  Active   rosuvastatin  (CRESTOR ) 10 MG tablet [id number]  TAKE ONE TABLET BY MOUTH ONCE DAILY Thersia Flax, MD  Active   solifenacin  (VESICARE ) 5 MG tablet [id number]  TAKE ONE TABLET BY MOUTH EVERY DAY Tullo, Teresa L, MD  Active   traZODone  (DESYREL ) 50 MG tablet [id number]  TAKE 1/2 TO 1 TABLET BY MOUTH AT BEDTIME AS NEEDED FOR SLEEP Thersia Flax, MD  Active   vitamin B-12 (CYANOCOBALAMIN ) 1000 MCG tablet [id number]  Take 1,000 mcg by mouth daily.  Patient not taking: Reported on 09/12/2023   [provider]  Active Self, Pharmacy Records           Med Note Parke Boll, Morrell Aran   Tue Sep 12, 2023 12:12 PM) Patient stated hospitalist provider told patient to stop taking as levels were high.             Recommendation:   Increase Activity to move secretions around Increase fluids Increase protein intake to improve energy Adequate rest Take medications as prescribed for Pneumonia and cough  Follow Up Plan:   Telephone follow-up in 1 week  [name], BSN, RN [place]  VBCI - [facility] RN Care Manager [phone]

## 2023-09-20 NOTE — Patient Instructions (Signed)
 Visit Information  Thank you for taking time to visit with me today. Please don't hesitate to contact me if I can be of assistance to you before our next scheduled telephone appointment.  Our next appointment is by telephone on Wednesday June 4th at 11:30am  Following is a copy of your care plan:   Goals Addressed             This Visit's Progress    VBCI Transitions of Care (TOC) Care Plan       Problems: (reviewed 09/20/23) Recent Hospitalization for treatment of COPD and pneumonia Equipment/DME barrier [employer] has not delivered CPAP mask and tubing per PCP provider orders per patient.  09/20/23 - the patient does not have her CPAP  Goal:  (reviewed 09/20/23) Over the next 30 days, the patient will not experience hospital readmission  Interventions:  (reviewed 09/20/23) Transitions of Care: Doctor Visits  - discussed the importance of doctor visits Troubleshoot use of DME in the home  Contacted DME company for patient use of equipment  Patient Self Care Activities:  Attend all scheduled provider appointments Call pharmacy for medication refills 3-7 days in advance of running out of medications Call provider office for new concerns or questions  Notify RN Care Manager of TOC call rescheduling needs Participate in Transition of Care Program/Attend TOC scheduled calls Take medications as prescribed    Plan:  Next PCP appointment scheduled for:  Telephone follow up appointment with care management team member scheduled for:  09/27/23 at 1130 am        Patient verbalizes understanding of instructions and care plan provided today and agrees to view in MyChart. Active MyChart status and patient understanding of how to access instructions and care plan via MyChart confirmed with patient.     The patient has been provided with contact information for the care management team and has been advised to call with any health related questions or concerns.   Please call the care  guide team at [phone] if you need to cancel or reschedule your appointment.   Please call the Suicide and Crisis Lifeline: 988 call the [place]  National Suicide Prevention Lifeline: 618-450-1568 or TTY: 914 667 4078 TTY 262-019-4453) to talk to a trained counselor if you are experiencing a Mental Health or Behavioral Health Crisis or need someone to talk to.  [name], BSN, RN [place]  VBCI - [employer] [facility] RN Care Manager [phone]

## 2023-09-25 NOTE — Telephone Encounter (Signed)
 I spoke with Chabs with [employer] and she confirmed they have this order and the signed Rx from provider

## 2023-09-26 ENCOUNTER — Ambulatory Visit
Admission: RE | Admit: 2023-09-26 | Discharge: 2023-09-26 | Disposition: A | Discharge status: Home / Self Care | Source: Ambulatory Visit | Attending: Pulmonary Disease | Admitting: Pulmonary Disease

## 2023-09-26 ENCOUNTER — Ambulatory Visit: Admitting: Pulmonary Disease

## 2023-09-26 ENCOUNTER — Encounter: Payer: Self-pay | Admitting: Pulmonary Disease

## 2023-09-26 VITALS — BP 118/68 | HR 75 | Temp 97.1°F | Ht 63.0 in | Wt 181.8 lb

## 2023-09-26 DIAGNOSIS — J690 Pneumonitis due to inhalation of food and vomit: Secondary | ICD-10-CM | POA: Insufficient documentation

## 2023-09-26 DIAGNOSIS — D46Z Other myelodysplastic syndromes: Secondary | ICD-10-CM

## 2023-09-26 DIAGNOSIS — J479 Bronchiectasis, uncomplicated: Secondary | ICD-10-CM

## 2023-09-26 DIAGNOSIS — J4540 Moderate persistent asthma, uncomplicated: Secondary | ICD-10-CM | POA: Diagnosis not present

## 2023-09-26 MED ORDER — TRELEGY ELLIPTA 200-62.5-25 MCG/ACT IN AEPB: 1.0000 | INHALATION_SPRAY | Freq: Every day | RESPIRATORY_TRACT | 0 refills | Status: DC

## 2023-09-26 NOTE — Patient Instructions (Signed)
 VISIT SUMMARY:  You had a follow-up appointment today after being hospitalized for pneumonia. The pneumonia affected one of your lungs and was likely caused by vomiting, which led to the infection. You have completed your antibiotics and are now in the recovery phase.  YOUR PLAN:  -ASPIRATION PNEUMONIA: Aspiration pneumonia is a lung infection that occurs when you inhale food, liquid, or vomit into your lungs. This can cause a severe infection, as it did in your case. You have completed your antibiotics, and we will order a chest x-ray to ensure the pneumonia has resolved. You will also receive samples of Prelegy, and you should start taking your three times a week medication on Monday (Azithromycin ). We will ensure that your medication is refilled.  INSTRUCTIONS:  Please get a chest x-ray to confirm that your pneumonia has resolved. Start taking your three times a week medication on Monday. We will ensure that your medication is refilled. If you have any issues or do not receive your supplies soon, please contact our office.  You have a follow-up appointment with us  in August, please keep that appointment.

## 2023-09-26 NOTE — Progress Notes (Signed)
 Subjective:    Patient ID: Sarah [name], female    DOB: 1950-08-29, 73 y.o.   MRN: [id number]  Patient Care Team: Thersia Flax, MD as PCP - General (Internal Medicine) Constancia Delton, MD as PCP - Cardiology (Cardiology) Thersia Flax, MD (Internal Medicine) Marquita Situ Magali Schmitz, MD (General Surgery) Gwyn Leos, MD as Consulting Physician (Hematology and Oncology) Green, Davina E, RN as [facility], Candice Chalet, RN as [facility] Care Management  Chief Complaint  Patient presents with   Follow-up    ED for pneumonia on 5/12. Cough with yellow sputum. No wheezing. DOE.     BACKGROUND/INTERVAL:Patient is a 73 year old lifelong never smoker with a history of moderate persistent asthma who presents today as a scheduled visit.  I last saw the patient on 31 Aug 2023.  She has been maintained on Trelegy Ellipta  200, 1 inhalation daily for her asthma.  She is also on rescue albuterol .  She is on CPAP for obstructive sleep apnea.  At that time she was doing well.  HPI Discussed the use of AI scribe software for clinical note transcription with the patient, who gave verbal consent to proceed.  History of Present Illness   Sarah [name] is a 73 year old female who presents for a post-hospital follow-up after being treated for pneumonia.  Recall she was seen here on 31 Aug 2023 at that time was doing well.  However, she had an episode of severe nausea vomiting and diarrhea and developed shortness of breath after an episode of "massive" vomiting.  She was seen in the ED at [facility] and subsequently hospitalized.  She had a right middle lobe pneumonia.The illness significantly impacted her recovery, as she described it as 'kicked my butt'.  Story is consistent with aspiration pneumonia. She completed a course of antibiotics for the pneumonia, with the last dose taken yesterday. She is currently awaiting necessary supplies for her CPAP, which have been  delayed due to supplier issues. The referral for these supplies was initially canceled but has since been reissued, although the supplies have not yet arrived.  Basically she just needs tubing to attach her mask to be CPAP unit.  She is currently taking azithromycin  three times a week, which she plans to resume on the upcoming Monday.      Review of Systems A 10 point review of systems was performed and it is as noted above otherwise negative.   Patient Active Problem List   Diagnosis Date Noted   Acute hypoxic respiratory failure (HCC) 09/04/2023   Right middle lobe pneumonia 09/04/2023   History of cystitis 05/07/2023   Coronary artery disease due to calcified coronary lesion 05/05/2023   Persistent asthma with acute exacerbation 05/05/2023   Bilateral lower extremity edema 12/02/2022   OSA (obstructive sleep apnea) 09/06/2022   Hyperglycemia 08/15/2022   Snoring 06/03/2022   Hypersomnia 05/18/2022   Suprapubic cramping 05/18/2022   Overactive bladder 05/18/2022   MDS (myelodysplastic syndrome), low grade (HCC) 04/20/2022   Tubular adenoma of colon 02/07/2022   Cervical myelopathy (HCC) 11/10/2021   Spinal stenosis, cervical region 09/15/2021   Adenomatous polyp of transverse colon    Polyp of colon    Thrombocytopenia (HCC) 01/13/2021   Essential hypertension 12/20/2020   Macrocytic anemia 12/18/2020   Allergic rhinitis 12/13/2020   Fatigue 12/13/2020   Insomnia 12/13/2020   Grief reaction 08/09/2020   Cognitive complaints 08/09/2020   Neuropathy 06/14/2020   Educated about COVID-19 virus infection 04/10/2019  Prediabetes 10/13/2018   LVH (left ventricular hypertrophy) due to hypertensive disease, without heart failure 10/13/2018   Hospital discharge follow-up 07/08/2018   Hypothyroid 06/15/2017   Post herpetic neuralgia 06/06/2017   Encounter for preventive health examination 09/09/2014   Chronic GERD 09/09/2014   Hyperlipidemia 08/17/2014   S/P hysterectomy  04/28/2013   Initial Medicare annual wellness visit 04/28/2013   Depression     Social History   Tobacco Use   Smoking status: Never   Smokeless tobacco: Never  Substance Use Topics   Alcohol use: No    No Known Allergies  Current Meds  Medication Sig   albuterol  (VENTOLIN  HFA) 108 (90 Base) MCG/ACT inhaler Inhale 1-2 puffs into the lungs every 6 (six) hours as needed for wheezing or shortness of breath.   amLODipine  (NORVASC ) 5 MG tablet TAKE 1 TABLET BY MOUTH DAILY   benzonatate  (TESSALON ) 200 MG capsule Take 1 capsule (200 mg total) by mouth 3 (three) times daily as needed for cough.   Calcium  Citrate-Vitamin D  (CALCIUM  CITRATE + D PO) Take 1 tablet by mouth 2 (two) times daily.   citalopram  (CELEXA ) 20 MG tablet TAKE 1 TABLET BY MOUTH DAILY   famotidine  (PEPCID ) 20 MG tablet TAKE 1 TABLET BY MOUTH DAILY. BEFORE DINNER   ferrous sulfate  324 MG TBEC Take 2 tablets by mouth. 65 mg of elemental iron   fexofenadine (ALLEGRA) 180 MG tablet Take 180 mg by mouth daily.   Fluticasone -Umeclidin-Vilant (TRELEGY ELLIPTA ) 200-62.5-25 MCG/ACT AEPB Inhale 1 puff into the lungs daily.   gabapentin  (NEURONTIN ) 100 MG capsule TAKE 1 CAPSULE BY MOUTH 3 TIMES  DAILY   levothyroxine  (SYNTHROID ) 50 MCG tablet TAKE 1 TABLET BY MOUTH DAILY ON  AN EMPTY STOMACH WITH A GLASS OF WATER AT LEAST 30 TO 60 MINUTES  BEFORE BREAKFAST   pantoprazole  (PROTONIX ) 40 MG tablet TAKE 1 TABLET BY MOUTH DAILY   rosuvastatin  (CRESTOR ) 10 MG tablet TAKE ONE TABLET BY MOUTH ONCE DAILY   solifenacin  (VESICARE ) 5 MG tablet TAKE ONE TABLET BY MOUTH EVERY DAY   traZODone  (DESYREL ) 50 MG tablet TAKE 1/2 TO 1 TABLET BY MOUTH AT BEDTIME AS NEEDED FOR SLEEP   vitamin B-12 (CYANOCOBALAMIN ) 1000 MCG tablet Take 1,000 mcg by mouth daily.    Immunization History  Administered Date(s) Administered   Fluad Quad(high Dose 65+) 12/06/2018   Fluad Trivalent(High Dose 65+) 01/16/2023   Influenza, High Dose Seasonal PF 04/22/2016,  06/05/2017   Influenza,inj,Quad PF,6+ Mos 04/26/2013, 01/30/2015   Influenza-Unspecified 01/24/2018, 12/06/2018, 01/02/2020, 04/02/2021, 02/03/2022   Moderna Covid-19 Fall Seasonal Vaccine 41yrs & older 02/10/2023   Moderna Covid-19 Vaccine Bivalent Booster 59yrs & up 02/05/2021, 02/03/2022   Moderna Sars-Covid-2 Vaccination 06/20/2019, 07/22/2019, 02/14/2020   PNEUMOCOCCAL CONJUGATE-20 11/09/2020   Pneumococcal Conjugate-13 09/01/2014, 01/24/2018   Pneumococcal Polysaccharide-23 04/26/2013, 02/28/2019   Rsv, Bivalent, Protein Subunit Rsvpref,pf Pattricia Bores) 05/26/2022   Tdap 06/18/2014, 05/26/2022   Zoster Recombinant(Shingrix) 07/08/2017, 09/22/2017        Objective:   BP 118/68 (BP Location: Right Arm, Cuff Size: Normal)   Pulse 75   Temp (!) 97.1 F (36.2 C)   Ht 5\' 3"  (1.6 m)   Wt 181 lb 12.8 oz (82.5 kg)   SpO2 95%   BMI 32.20 kg/m   SpO2: 95 % O2 Device: None (Room air)  GENERAL: Overweight woman, no acute distress.  Fully ambulatory, no conversational dyspnea, no pallor noted today. HEAD: Normocephalic, atraumatic.  EYES: Pupils equal, round, reactive to light.  No scleral icterus.  Pale conjunctiva. MOUTH: Natural dentition present, few chipped teeth.  Oral mucosa moist. NECK: Supple. No thyromegaly. Trachea midline. No JVD.  No adenopathy. PULMONARY: Lungs sound coarse but otherwise clear to auscultation bilaterally. CARDIOVASCULAR: S1 and S2. Regular rate and rhythm.  No rubs, murmurs gallops heard. GASTROINTESTINAL: Benign. MUSCULOSKELETAL: No joint deformity, no clubbing, no edema.  NEUROLOGIC: No overt focal deficits. SKIN: Intact,warm,dry. PSYCH: Mood and behavior appropriate.    Independent review of chest x-ray performed today shows clearing of the previously noted infiltrates:   Assessment & Plan:     ICD-10-CM   1. Aspiration pneumonia of right middle lobe due to vomit (HCC)  J69.0 DG Chest 2 View    2. Bronchiectasis without complication (HCC)   J47.9     3. Moderate persistent asthma without complication  J45.40     4. MDS (myelodysplastic syndrome), low grade (HCC)  D46.Z       Orders Placed This Encounter  Procedures   DG Chest 2 View    Standing Status:   Future    Expected Date:   09/26/2023    Expiration Date:   09/25/2024    Reason for Exam (SYMPTOM  OR DIAGNOSIS REQUIRED):   Aspiration PNA f/u    Preferred imaging location?:   [facility]   Discussion:    Aspiration pneumonia Aspiration pneumonia likely secondary to an episode of vomiting leading to aspiration, affecting one lung and necessitating hospitalization. She is recuperating slowly. The anatomical location is consistent with aspiration. She completed the antibiotic course yesterday. - Order chest x-ray to ensure pneumonia resolution, review shows improvement/clearing of prior infiltrate - Provide Trelegy samples - Instruct to start Azithromycin  three times a week on Monday (for bronchiectasis) - Ensure refill of the three times a week medication      Advised if symptoms do not improve or worsen, to please contact office for sooner follow up or seek emergency care.    I spent 32 minutes of dedicated to the care of this patient on the date of this encounter to include pre-visit review of records, face-to-face time with the patient discussing conditions above, post visit ordering of testing, clinical documentation with the electronic health record, making appropriate referrals as documented, and communicating necessary findings to members of the patients care team.     C. Chloe Counter, MD Advanced Bronchoscopy PCCM [facility]    *This note was generated using voice recognition software/Dragon and/or AI transcription program.  Despite best efforts to proofread, errors can occur which can change the meaning. Any transcriptional errors that result from this process are unintentional and may not be fully corrected at the time of  dictation.

## 2023-09-27 ENCOUNTER — Telehealth (HOSPITAL_BASED_OUTPATIENT_CLINIC_OR_DEPARTMENT_OTHER): Payer: Self-pay

## 2023-09-27 ENCOUNTER — Other Ambulatory Visit: Payer: Self-pay

## 2023-09-27 NOTE — Transitions of Care (Post Inpatient/ED Visit) (Signed)
 Transition of Care week 3  Visit Note  09/27/2023  Name: Sarah [name] MRN: [id number]          DOB: [birth year]  Situation: Patient enrolled in [facility] 30-day program. Visit completed with Elmarie Hacking by telephone.   Background:     Past Medical History:  Diagnosis Date   Arthritis    Asthma    CAP (community acquired pneumonia) 08/11/2022   Depression    Dyspnea    with heat   Environmental and seasonal allergies    GERD (gastroesophageal reflux disease)    Hyperlipidemia    Hypertension    Hypothyroidism    Presence of dental prosthetic device    Dental implants   Wheezing     Assessment: Patient Reported Symptoms: Cognitive Cognitive Status: Alert and oriented to person, place, and time      Neurological Neurological Review of Symptoms: No symptoms reported    HEENT HEENT Symptoms Reported: No symptoms reported      Cardiovascular Cardiovascular Symptoms Reported: No symptoms reported Does patient have uncontrolled Hypertension?: No Weight: 181 lb (82.1 kg) Cardiovascular Self-Management Outcome: 4 (good)  Respiratory Respiratory Symptoms Reported: Productive cough Other Respiratory Symptoms: Weaness Additional Respiratory Details: State she is feeling a little better but her weakness is due to anemia. Her HGB is 9.8 and she is tired. Continues on Antibiotics for now. Saw Pulmonolgy 09/26/23 and her CXR was clear Respiratory Conditions: Asthma, Sleep disordered breathing, Cough Respiratory Self-Management Outcome: 3 (uncertain) Respiratory Comment: Improving slowly  Endocrine Patient reports the following symptoms related to hypoglycemia or hyperglycemia : No symptoms reported Is patient diabetic?: No    Gastrointestinal Gastrointestinal Symptoms Reported: No symptoms reported   Nutrition Risk Screen (CP): No indicators present  Genitourinary Genitourinary Symptoms Reported: No symptoms reported    Integumentary Integumentary Symptoms Reported: No symptoms  reported    Musculoskeletal Musculoskelatal Symptoms Reviewed: No symptoms reported   Falls in the past year?: No Number of falls in past year: 1 or less Was there an injury with Fall?: No Fall Risk Category Calculator: 0 Patient Fall Risk Level: Low Fall Risk Patient at Risk for Falls Due to: No Fall Risks Fall risk Follow up: Falls evaluation completed  Psychosocial Psychosocial Symptoms Reported: No symptoms reported         There were no vitals filed for this visit.  Medications Reviewed Today     Reviewed by Claudene Crystal, RN (Case Manager) on 09/27/23 at 1139  Med List Status: <None>   Medication Order Taking? Sig Documenting Provider Last Dose Status Informant  albuterol  (VENTOLIN  HFA) 108 (90 Base) MCG/ACT inhaler [id number]  Inhale 1-2 puffs into the lungs every 6 (six) hours as needed for wheezing or shortness of breath. Thersia Flax, MD  Active            Med Note Janise Melia Sep 04, 2023 10:04 PM) PRN  amLODipine  (NORVASC ) 5 MG tablet [id number] Yes TAKE 1 TABLET BY MOUTH DAILY Tullo, Teresa L, MD Taking Active   azithromycin  (ZITHROMAX ) 250 MG tablet [id number] Yes Take 1 tablet (250 mg total) by mouth 3 (three) times a week. M-W-F Marc Senior, MD Taking Active   benzonatate  (TESSALON ) 200 MG capsule [id number]  Take 1 capsule (200 mg total) by mouth 3 (three) times daily as needed for cough. Tiajuana Fluke, MD  Active   Calcium  Citrate-Vitamin D  (CALCIUM  CITRATE + D PO) [id number] Yes Take 1 tablet by mouth 2 (two) times  daily. [provider] Taking Active Self, Pharmacy Records  citalopram  (CELEXA ) 20 MG tablet [id number] Yes TAKE 1 TABLET BY MOUTH DAILY Tullo, Teresa L, MD Taking Active   famotidine  (PEPCID ) 20 MG tablet [id number] Yes TAKE 1 TABLET BY MOUTH DAILY. BEFORE DINNER Thersia Flax, MD Taking Active Self, Pharmacy Records  ferrous sulfate  324 MG TBEC [id number] Yes Take 2 tablets by mouth. 65 mg of elemental iron  [provider] Taking Active Self, Pharmacy Records  fexofenadine (ALLEGRA) 180 MG tablet [id number] Yes Take 180 mg by mouth daily. [provider] Taking Active Self, Pharmacy Records  Fluticasone -Umeclidin-Vilant (TRELEGY ELLIPTA ) 200-62.5-25 MCG/ACT AEPB [id number] Yes Inhale 1 puff into the lungs daily. Marc Senior, MD Taking Active   Fluticasone -Umeclidin-Vilant (TRELEGY ELLIPTA ) 200-62.5-25 MCG/ACT AEPB [id number]  Inhale 1 puff into the lungs daily. Marc Senior, MD  Consider Medication Status and Discontinue (Duplicate)   gabapentin  (NEURONTIN ) 100 MG capsule [id number] Yes TAKE 1 CAPSULE BY MOUTH 3 TIMES  DAILY Thersia Flax, MD Taking Active   levothyroxine  (SYNTHROID ) 50 MCG tablet [id number] Yes TAKE 1 TABLET BY MOUTH DAILY ON  AN EMPTY STOMACH WITH A GLASS OF WATER AT LEAST 30 TO 60 MINUTES  BEFORE BREAKFAST Thersia Flax, MD Taking Active   pantoprazole  (PROTONIX ) 40 MG tablet [id number] Yes TAKE 1 TABLET BY MOUTH DAILY Tullo, Teresa L, MD Taking Active   rosuvastatin  (CRESTOR ) 10 MG tablet [id number] Yes TAKE ONE TABLET BY MOUTH ONCE DAILY Thersia Flax, MD Taking Active   solifenacin  (VESICARE ) 5 MG tablet [id number] Yes TAKE ONE TABLET BY MOUTH EVERY DAY Thersia Flax, MD Taking Active   traZODone  (DESYREL ) 50 MG tablet [id number] No TAKE 1/2 TO 1 TABLET BY MOUTH AT BEDTIME AS NEEDED FOR SLEEP Thersia Flax, MD Unknown Active   vitamin B-12 (CYANOCOBALAMIN ) 1000 MCG tablet [id number]  Take 1,000 mcg by mouth daily. [provider]  Active Self, Pharmacy Records           Med Note Parke Boll, Bentley B   Tue Sep 12, 2023 12:12 PM) Patient stated hospitalist provider told patient to stop taking as levels were high.             Recommendation:   Continue Current Plan of Care  Follow Up Plan:   Telephone follow-up in 1 week  [name], BSN, RN [place]  VBCI - [facility] RN Care Manager [phone]

## 2023-09-27 NOTE — Telephone Encounter (Signed)
 Copied from CRM [id number]. Topic: General - Other >> Sep 27, 2023 11:43 AM Alverda Joe S wrote: Reason for CRM: patient received the wrong tubing for cpap and need to speak to a nurse please call at [phone]

## 2023-09-27 NOTE — Patient Instructions (Signed)
 Visit Information  Thank you for taking time to visit with me today. Please don't hesitate to contact me if I can be of assistance to you before our next scheduled telephone appointment.  Our next appointment is by telephone on Wednesday June 11th at 11:00am  Following is a copy of your care plan:   Goals Addressed             This Visit's Progress    VBCI Transitions of Care (TOC) Care Plan       Problems: (reviewed 09/27/23) Recent Hospitalization for treatment of COPD and pneumonia Equipment/DME barrier [employer] has not delivered CPAP mask and tubing per PCP provider orders per patient.  09/27/23. The patient states she does have her CPAP but the tubing piece delivered is inadequate  Goal:  (reviewed 09/27/23) Over the next 30 days, the patient will not experience hospital readmission  Interventions: (reviewed 09/27/23) Transitions of Care: Doctor Visits  - discussed the importance of doctor visits Troubleshoot use of DME in the home  Contacted DME company for patient use of equipment Repeat Chest XR as directed by the provider Take all prescribed medication for Pneumonia and COPD  Patient Self Care Activities:  Attend all scheduled provider appointments Call pharmacy for medication refills 3-7 days in advance of running out of medications Call provider office for new concerns or questions  Notify RN Care Manager of TOC call rescheduling needs Participate in Transition of Care Program/Attend TOC scheduled calls Take medications as prescribed    Plan:  Telephone follow up appointment with care management team member scheduled for:  10/04/23 at 11:am        Patient verbalizes understanding of instructions and care plan provided today and agrees to view in MyChart. Active MyChart status and patient understanding of how to access instructions and care plan via MyChart confirmed with patient.     The patient has been provided with contact information for the care management  team and has been advised to call with any health related questions or concerns.   Please call the care guide team at [phone] if you need to cancel or reschedule your appointment.   Please call the Suicide and Crisis Lifeline: 988 call the [place]  National Suicide Prevention Lifeline: 5344487378 or TTY: 509-799-9040 TTY 8738672145) to talk to a trained counselor if you are experiencing a Mental Health or Behavioral Health Crisis or need someone to talk to.  [name], BSN, RN [place]  VBCI - [facility] RN Care Manager [phone]

## 2023-10-04 ENCOUNTER — Other Ambulatory Visit: Payer: Self-pay

## 2023-10-04 NOTE — Patient Instructions (Signed)
 Visit Information  Thank you for taking time to visit with me today. Please don't hesitate to contact me if I can be of assistance to you before our next scheduled telephone appointment.  Our next appointment is by telephone on Wednesday June 18th at 11:00am  Following is a copy of your care plan:   Goals Addressed             This Visit's Progress    VBCI Transitions of Care (TOC) Care Plan       Problems: (reviewed 10/04/23) Recent Hospitalization for treatment of COPD and pneumonia Equipment/DME barrier [employer] has not delivered CPAP mask and tubing per PCP provider orders per patient.  09/27/23. The patient states she does have her CPAP but the tubing piece delivered is inadequate 10/04/23 - The patient has what she needs for her CPAP supplies  Goal:  (reviewed 10/04/23) Over the next 30 days, the patient will not experience hospital readmission  Interventions: (reviewed 10/04/23) Transitions of Care: Doctor Visits  - discussed the importance of doctor visits Troubleshoot use of DME in the home  Contacted DME company for patient use of equipment Repeat Chest XR as directed by the provider Take all prescribed medication for Pneumonia and COPD Take adequate rest breaks as needed for SOB due to anemia  Patient Self Care Activities:  Attend all scheduled provider appointments Call pharmacy for medication refills 3-7 days in advance of running out of medications Call provider office for new concerns or questions  Notify RN Care Manager of TOC call rescheduling needs Participate in Transition of Care Program/Attend TOC scheduled calls Take medications as prescribed    Plan:  Telephone follow up appointment with care management team member scheduled for:  10/11/23 at 11:00am        Patient verbalizes understanding of instructions and care plan provided today and agrees to view in MyChart. Active MyChart status and patient understanding of how to access instructions and care  plan via MyChart confirmed with patient.     The patient has been provided with contact information for the care management team and has been advised to call with any health related questions or concerns.   Please call the care guide team at [phone] if you need to cancel or reschedule your appointment.   Please call the Suicide and Crisis Lifeline: 988 call the [place]  National Suicide Prevention Lifeline: 3031406891 or TTY: 548-147-3150 TTY 708 333 0817) to talk to a trained counselor if you are experiencing a Mental Health or Behavioral Health Crisis or need someone to talk to.  [name], BSN, RN [place]  VBCI - [facility] RN Care Manager [phone]

## 2023-10-04 NOTE — Transitions of Care (Post Inpatient/ED Visit) (Signed)
 Transition of Care week 4  Visit Note  10/04/2023  Name: Sarah [name] MRN: [id number]          DOB: 12-25-1950  Situation: Patient enrolled in [facility] 30-day program. Visit completed with Elmarie Hacking by telephone.   Background:    Past Medical History:  Diagnosis Date   Arthritis    Asthma    CAP (community acquired pneumonia) 08/11/2022   Depression    Dyspnea    with heat   Environmental and seasonal allergies    GERD (gastroesophageal reflux disease)    Hyperlipidemia    Hypertension    Hypothyroidism    Presence of dental prosthetic device    Dental implants   Wheezing     Assessment: Patient Reported Symptoms: Cognitive Cognitive Status: Alert and oriented to person, place, and time      Neurological Neurological Review of Symptoms: No symptoms reported    HEENT HEENT Symptoms Reported: No symptoms reported      Cardiovascular Cardiovascular Symptoms Reported: No symptoms reported    Respiratory Respiratory Symptoms Reported: Productive cough, Shortness of breath Other Respiratory Symptoms: Weakness Additional Respiratory Details: SOB is due to anemia. She states she tries to clean and do things around the house but she gets SOB and has to take frequent rest breaks. Respiratory Conditions: Asthma, Sleep disordered breathing, Cough Respiratory Self-Management Outcome: 3 (uncertain) Respiratory Comment: Still slowly recovering. She hasn't noticed much improvement  Endocrine Patient reports the following symptoms related to hypoglycemia or hyperglycemia : No symptoms reported Is patient diabetic?: No    Gastrointestinal Gastrointestinal Symptoms Reported: No symptoms reported      Genitourinary Genitourinary Symptoms Reported: No symptoms reported    Integumentary Integumentary Symptoms Reported: No symptoms reported    Musculoskeletal Musculoskelatal Symptoms Reviewed: Weakness Musculoskeletal Management Strategies: Adequate rest Falls in the past  year?: No Number of falls in past year: 1 or less Was there an injury with Fall?: No Fall Risk Category Calculator: 0 Patient Fall Risk Level: Low Fall Risk Patient at Risk for Falls Due to: No Fall Risks  Psychosocial Psychosocial Symptoms Reported: No symptoms reported         There were no vitals filed for this visit.  Medications Reviewed Today     Reviewed by Claudene Crystal, RN (Case Manager) on 10/04/23 at 1539  Med List Status: <None>   Medication Order Taking? Sig Documenting Provider Last Dose Status Informant  albuterol  (VENTOLIN  HFA) 108 (90 Base) MCG/ACT inhaler [id number]  Inhale 1-2 puffs into the lungs every 6 (six) hours as needed for wheezing or shortness of breath. Thersia Flax, MD  Active            Med Note Janise Melia Sep 04, 2023 10:04 PM) PRN  amLODipine  (NORVASC ) 5 MG tablet [id number]  TAKE 1 TABLET BY MOUTH DAILY Tullo, Teresa L, MD  Active   azithromycin  (ZITHROMAX ) 250 MG tablet [id number]  Take 1 tablet (250 mg total) by mouth 3 (three) times a week. M-W-F Marc Senior, MD  Active   benzonatate  (TESSALON ) 200 MG capsule [id number]  Take 1 capsule (200 mg total) by mouth 3 (three) times daily as needed for cough. Tiajuana Fluke, MD  Active   Calcium  Citrate-Vitamin D  (CALCIUM  CITRATE + D PO) [id number]  Take 1 tablet by mouth 2 (two) times daily. [provider]  Active Self, Pharmacy Records  citalopram  (CELEXA ) 20 MG tablet [id number]  TAKE 1 TABLET BY MOUTH DAILY Tullo,  Ray Caffey, MD  Active   famotidine  (PEPCID ) 20 MG tablet [id number]  TAKE 1 TABLET BY MOUTH DAILY. BEFORE DINNER Thersia Flax, MD  Active Self, Pharmacy Records  ferrous sulfate  324 MG TBEC [id number]  Take 2 tablets by mouth. 65 mg of elemental iron [provider]  Active Self, Pharmacy Records  fexofenadine (ALLEGRA) 180 MG tablet [id number]  Take 180 mg by mouth daily. [provider]  Active Self, Pharmacy Records   Fluticasone -Umeclidin-Vilant (TRELEGY ELLIPTA ) 200-62.5-25 MCG/ACT AEPB [id number]  Inhale 1 puff into the lungs daily. Marc Senior, MD  Active   Fluticasone -Umeclidin-Vilant (TRELEGY ELLIPTA ) 200-62.5-25 MCG/ACT AEPB [id number]  Inhale 1 puff into the lungs daily. Marc Senior, MD  Consider Medication Status and Discontinue (Duplicate)   gabapentin  (NEURONTIN ) 100 MG capsule [id number]  TAKE 1 CAPSULE BY MOUTH 3 TIMES  DAILY Thersia Flax, MD  Active   levothyroxine  (SYNTHROID ) 50 MCG tablet [id number]  TAKE 1 TABLET BY MOUTH DAILY ON  AN EMPTY STOMACH WITH A GLASS OF WATER AT LEAST 30 TO 60 MINUTES  BEFORE BREAKFAST Thersia Flax, MD  Active   pantoprazole  (PROTONIX ) 40 MG tablet [id number]  TAKE 1 TABLET BY MOUTH DAILY Tullo, Teresa L, MD  Active   rosuvastatin  (CRESTOR ) 10 MG tablet [id number]  TAKE ONE TABLET BY MOUTH ONCE DAILY Thersia Flax, MD  Active   solifenacin  (VESICARE ) 5 MG tablet [id number]  TAKE ONE TABLET BY MOUTH EVERY DAY Thersia Flax, MD  Active   traZODone  (DESYREL ) 50 MG tablet [id number]  TAKE 1/2 TO 1 TABLET BY MOUTH AT BEDTIME AS NEEDED FOR SLEEP Thersia Flax, MD  Active   vitamin B-12 (CYANOCOBALAMIN ) 1000 MCG tablet [id number]  Take 1,000 mcg by mouth daily. [provider]  Active Self, Pharmacy Records           Med Note Parke Boll, [place] B   Tue Sep 12, 2023 12:12 PM) Patient stated hospitalist provider told patient to stop taking as levels were high.             Recommendation:   Continue Current Plan of Care  Follow Up Plan:   Telephone follow-up in 1 week  [name], BSN, RN [place]  VBCI - [facility] RN Care Manager [phone]

## 2023-10-05 ENCOUNTER — Ambulatory Visit: Payer: Self-pay | Admitting: Pulmonary Disease

## 2023-10-05 NOTE — Progress Notes (Signed)
 Notified pt via voicemail

## 2023-10-11 ENCOUNTER — Other Ambulatory Visit: Payer: Self-pay

## 2023-10-11 NOTE — Transitions of Care (Post Inpatient/ED Visit) (Signed)
 Transition of Care week 5  Visit Note  10/11/2023  Name: Sarah [name] MRN: [id number]          DOB: 22-Nov-1950  Situation: Patient enrolled in [facility] 30-day program. Visit completed with Elmarie Hacking by telephone.   Background:   Past Medical History:  Diagnosis Date   Arthritis    Asthma    CAP (community acquired pneumonia) 08/11/2022   Depression    Dyspnea    with heat   Environmental and seasonal allergies    GERD (gastroesophageal reflux disease)    Hyperlipidemia    Hypertension    Hypothyroidism    Presence of dental prosthetic device    Dental implants   Wheezing     Assessment: Patient Reported Symptoms: Cognitive Cognitive Status: Alert and oriented to person, place, and time      Neurological Neurological Review of Symptoms: No symptoms reported    HEENT HEENT Symptoms Reported: No symptoms reported      Cardiovascular Cardiovascular Symptoms Reported: No symptoms reported Does patient have uncontrolled Hypertension?: No Weight: 181 lb (82.1 kg)  Respiratory Respiratory Symptoms Reported: No symptoms reported Additional Respiratory Details: The patient states she feels so much better and has been out mowing the grass today. Respiratory Conditions: Sleep disordered breathing, Asthma Respiratory Comment: She has been taking Trelegy and mowing the grass and on states she has more energy. She wants to know if she can resume her B12  Endocrine Patient reports the following symptoms related to hypoglycemia or hyperglycemia : No symptoms reported Is patient diabetic?: No    Gastrointestinal Gastrointestinal Symptoms Reported: No symptoms reported      Genitourinary Genitourinary Symptoms Reported: No symptoms reported    Integumentary Integumentary Symptoms Reported: No symptoms reported    Musculoskeletal Musculoskelatal Symptoms Reviewed: No symptoms reported        Psychosocial Psychosocial Symptoms Reported: No symptoms reported          There were no vitals filed for this visit.  Medications Reviewed Today     Reviewed by Claudene Crystal, RN (Case Manager) on 10/11/23 at 1437  Med List Status: <None>   Medication Order Taking? Sig Documenting Provider Last Dose Status Informant  albuterol  (VENTOLIN  HFA) 108 (90 Base) MCG/ACT inhaler [id number]  Inhale 1-2 puffs into the lungs every 6 (six) hours as needed for wheezing or shortness of breath. Thersia Flax, MD  Active            Med Note Janise Melia Sep 04, 2023 10:04 PM) PRN  amLODipine  (NORVASC ) 5 MG tablet [id number]  TAKE 1 TABLET BY MOUTH DAILY Tullo, Teresa L, MD  Active   azithromycin  (ZITHROMAX ) 250 MG tablet [id number]  Take 1 tablet (250 mg total) by mouth 3 (three) times a week. M-W-F Marc Senior, MD  Active   benzonatate  (TESSALON ) 200 MG capsule [id number]  Take 1 capsule (200 mg total) by mouth 3 (three) times daily as needed for cough. Tiajuana Fluke, MD  Active   Calcium  Citrate-Vitamin D  (CALCIUM  CITRATE + D PO) [id number]  Take 1 tablet by mouth 2 (two) times daily. [provider]  Active Self, Pharmacy Records  citalopram  (CELEXA ) 20 MG tablet [id number]  TAKE 1 TABLET BY MOUTH DAILY Tullo, Teresa L, MD  Active   famotidine  (PEPCID ) 20 MG tablet [id number]  TAKE 1 TABLET BY MOUTH DAILY. BEFORE DINNER Thersia Flax, MD  Active Self, Pharmacy Records  ferrous sulfate  324 MG TBEC [id number]  Take 2 tablets by mouth. 65 mg of elemental iron [provider]  Active Self, Pharmacy Records  fexofenadine (ALLEGRA) 180 MG tablet [id number]  Take 180 mg by mouth daily. [provider]  Active Self, Pharmacy Records  Fluticasone -Umeclidin-Vilant (TRELEGY ELLIPTA ) 200-62.5-25 MCG/ACT AEPB [id number]  Inhale 1 puff into the lungs daily. Marc Senior, MD  Active   Fluticasone -Umeclidin-Vilant (TRELEGY ELLIPTA ) 200-62.5-25 MCG/ACT AEPB [id number]  Inhale 1 puff into the lungs daily. Marc Senior, MD  Active    gabapentin  (NEURONTIN ) 100 MG capsule [id number]  TAKE 1 CAPSULE BY MOUTH 3 TIMES  DAILY Thersia Flax, MD  Active   levothyroxine  (SYNTHROID ) 50 MCG tablet [id number]  TAKE 1 TABLET BY MOUTH DAILY ON  AN EMPTY STOMACH WITH A GLASS OF WATER AT LEAST 30 TO 60 MINUTES  BEFORE BREAKFAST Thersia Flax, MD  Active   pantoprazole  (PROTONIX ) 40 MG tablet [id number]  TAKE 1 TABLET BY MOUTH DAILY Tullo, Teresa L, MD  Active   rosuvastatin  (CRESTOR ) 10 MG tablet [id number]  TAKE ONE TABLET BY MOUTH ONCE DAILY Thersia Flax, MD  Active   solifenacin  (VESICARE ) 5 MG tablet [id number]  TAKE ONE TABLET BY MOUTH EVERY DAY Tullo, Teresa L, MD  Active   traZODone  (DESYREL ) 50 MG tablet [id number]  TAKE 1/2 TO 1 TABLET BY MOUTH AT BEDTIME AS NEEDED FOR SLEEP Thersia Flax, MD  Active   vitamin B-12 (CYANOCOBALAMIN ) 1000 MCG tablet [id number]  Take 1,000 mcg by mouth daily. [provider]  Active Self, Pharmacy Records           Med Note Parke Boll, [place] B   Tue Sep 12, 2023 12:12 PM) Patient stated hospitalist provider told patient to stop taking as levels were high.             Recommendation:   Continue Current Plan of Care  Follow Up Plan:   Telephone follow-up in 1 week  [name], BSN, RN [place]  VBCI - [facility] RN Care Manager [phone]

## 2023-10-11 NOTE — Patient Instructions (Signed)
 Visit Information  Thank you for taking time to visit with me today. Please don't hesitate to contact me if I can be of assistance to you before our next scheduled telephone appointment.  Our next appointment is by telephone on Tuesday June 24th at 2:00pm  Following is a copy of your care plan:   Goals Addressed             This Visit's Progress    VBCI Transitions of Care (TOC) Care Plan       Problems: (reviewed 10/11/23) Recent Hospitalization for treatment of COPD and pneumonia Equipment/DME barrier [employer] has not delivered CPAP mask and tubing per PCP provider orders per patient.  09/27/23. The patient states she does have her CPAP but the tubing piece delivered is inadequate 10/04/23 - The patient has what she needs for her CPAP supplies  Goal:  (reviewed 10/11/23) Over the next 30 days, the patient will not experience hospital readmission  Interventions: (reviewed 10/11/23) Transitions of Care: Doctor Visits  - discussed the importance of doctor visits Troubleshoot use of DME in the home  Contacted DME company for patient use of equipment Repeat Chest XR as directed by the provider Take all prescribed medication for Pneumonia and COPD Take adequate rest breaks as needed for SOB due to anemia  Patient Self Care Activities: (reviewed 10/11/23) Attend all scheduled provider appointments Call pharmacy for medication refills 3-7 days in advance of running out of medications Call provider office for new concerns or questions  Notify RN Care Manager of [facility] call rescheduling needs Participate in Transition of Care Program/Attend TOC scheduled calls Take medications as prescribed    Plan:  Telephone follow up appointment with care management team member scheduled for:  10/11/23 at 11:00am        Patient verbalizes understanding of instructions and care plan provided today and agrees to view in MyChart. Active MyChart status and patient understanding of how to access  instructions and care plan via MyChart confirmed with patient.     The patient has been provided with contact information for the care management team and has been advised to call with any health related questions or concerns.   Please call the care guide team at [phone] if you need to cancel or reschedule your appointment.   Please call the Suicide and Crisis Lifeline: 988 call the [place]  National Suicide Prevention Lifeline: (234) 128-0842 or TTY: 208-509-7668 TTY 818-323-4760) to talk to a trained counselor if you are experiencing a Mental Health or Behavioral Health Crisis or need someone to talk to.  [name], BSN, RN [place]  VBCI - [employer] [facility] RN Care Manager [phone]

## 2023-10-13 ENCOUNTER — Inpatient Hospital Stay: Admitting: Internal Medicine

## 2023-10-17 ENCOUNTER — Other Ambulatory Visit: Payer: Self-pay

## 2023-10-17 NOTE — Transitions of Care (Post Inpatient/ED Visit) (Signed)
 Transition of Care Week 6  Visit Note  10/17/2023  Name: Sarah [name] MRN: [id number]          DOB: [birth year]  Situation: Patient enrolled in [facility] 30-day program. Visit completed with Erminio Weight by telephone.   Background:     Past Medical History:  Diagnosis Date   Arthritis    Asthma    CAP (community acquired pneumonia) 08/11/2022   Depression    Dyspnea    with heat   Environmental and seasonal allergies    GERD (gastroesophageal reflux disease)    Hyperlipidemia    Hypertension    Hypothyroidism    Presence of dental prosthetic device    Dental implants   Wheezing     Assessment: Patient Reported Symptoms: Cognitive Cognitive Status: Alert and oriented to person, place, and time      Neurological Neurological Review of Symptoms: No symptoms reported    HEENT HEENT Symptoms Reported: No symptoms reported      Cardiovascular Cardiovascular Symptoms Reported: No symptoms reported Does patient have uncontrolled Hypertension?: No    Respiratory Respiratory Symptoms Reported: No symptoms reported Respiratory Conditions: Sleep disordered breathing, Asthma Respiratory Self-Management Outcome: 4 (good)  Endocrine Patient reports the following symptoms related to hypoglycemia or hyperglycemia : No symptoms reported Is patient diabetic?: No    Gastrointestinal Gastrointestinal Symptoms Reported: No symptoms reported      Genitourinary Genitourinary Symptoms Reported: No symptoms reported    Integumentary Integumentary Symptoms Reported: No symptoms reported    Musculoskeletal Musculoskelatal Symptoms Reviewed: No symptoms reported        Psychosocial Psychosocial Symptoms Reported: No symptoms reported         There were no vitals filed for this visit.  Medications Reviewed Today     Reviewed by Moises Reusing, RN (Case Manager) on 10/17/23 at 1419  Med List Status: <None>   Medication Order Taking? Sig Documenting Provider Last Dose Status  Informant  albuterol  (VENTOLIN  HFA) 108 (90 Base) MCG/ACT inhaler [id number] Yes Inhale 1-2 puffs into the lungs every 6 (six) hours as needed for wheezing or shortness of breath. [name], MD  Active            Med Note SIGRID ANNABELLA LOISE Pablo Sep 04, 2023 10:04 PM) PRN  amLODipine  (NORVASC ) 5 MG tablet [id number] Yes TAKE 1 TABLET BY MOUTH DAILY Tullo, Teresa L, MD  Active   azithromycin  (ZITHROMAX ) 250 MG tablet [id number] Yes Take 1 tablet (250 mg total) by mouth 3 (three) times a week. M-W-F Tamea Dedra CROME, MD  Active   benzonatate  (TESSALON ) 200 MG capsule [id number] Yes Take 1 capsule (200 mg total) by mouth 3 (three) times daily as needed for cough. Jhonny Calvin NOVAK, MD  Active   Calcium  Citrate-Vitamin D  (CALCIUM  CITRATE + D PO) [id number] Yes Take 1 tablet by mouth 2 (two) times daily. [provider]  Active Self, Pharmacy Records  citalopram  (CELEXA ) 20 MG tablet [id number] Yes TAKE 1 TABLET BY MOUTH DAILY Tullo, Teresa L, MD  Active   famotidine  (PEPCID ) 20 MG tablet [id number] Yes TAKE 1 TABLET BY MOUTH DAILY. BEFORE DINNER [name], MD  Active Self, Pharmacy Records  ferrous sulfate  324 MG TBEC [id number] Yes Take 2 tablets by mouth. 65 mg of elemental iron [provider]  Active Self, Pharmacy Records  fexofenadine (ALLEGRA) 180 MG tablet [id number] Yes Take 180 mg by mouth daily. [provider]  Active Self, Pharmacy Records  Fluticasone -Umeclidin-Vilant (TRELEGY  ELLIPTA) 200-62.5-25 MCG/ACT AEPB [id number] Yes Inhale 1 puff into the lungs daily. Tamea Dedra CROME, MD  Active   Fluticasone -Umeclidin-Vilant (TRELEGY ELLIPTA ) 200-62.5-25 MCG/ACT AEPB [id number] Yes Inhale 1 puff into the lungs daily. Tamea Dedra CROME, MD  Active   gabapentin  (NEURONTIN ) 100 MG capsule [id number] Yes TAKE 1 CAPSULE BY MOUTH 3 TIMES  DAILY [name], MD  Active   levothyroxine  (SYNTHROID ) 50 MCG tablet [id number] Yes TAKE 1 TABLET BY MOUTH DAILY ON  AN EMPTY  STOMACH WITH A GLASS OF WATER AT LEAST 30 TO 60 MINUTES  BEFORE BREAKFAST [name], MD  Active   pantoprazole  (PROTONIX ) 40 MG tablet [id number] Yes TAKE 1 TABLET BY MOUTH DAILY Tullo, Teresa L, MD  Active   rosuvastatin  (CRESTOR ) 10 MG tablet [id number] Yes TAKE ONE TABLET BY MOUTH ONCE DAILY [name], MD  Active   solifenacin  (VESICARE ) 5 MG tablet [id number] Yes TAKE ONE TABLET BY MOUTH EVERY DAY [name], MD  Active   traZODone  (DESYREL ) 50 MG tablet [id number] Yes TAKE 1/2 TO 1 TABLET BY MOUTH AT BEDTIME AS NEEDED FOR SLEEP [name], MD  Active   vitamin B-12 (CYANOCOBALAMIN ) 1000 MCG tablet [id number] Yes Take 1,000 mcg by mouth daily. [provider]  Active Self, Pharmacy Records           Med Note SYDELL, Silas B   Tue Sep 12, 2023 12:12 PM) Patient stated hospitalist provider told patient to stop taking as levels were high.             Recommendation:   Complete Hospital Follow Up scheduled for 11/03/23  Follow Up Plan:   Closing From:  Transitions of Care Program [facility], BSN, RN [place]  VBCI - [facility] RN Care Manager [phone]

## 2023-10-17 NOTE — Patient Instructions (Signed)
 Visit Information  Thank you for taking time to visit with me today. Please don't hesitate to contact me if I can be of assistance to you before our next scheduled telephone appointment.  Following is a copy of your care plan:   Goals Addressed             This Visit's Progress    COMPLETED: VBCI Transitions of Care (TOC) Care Plan       Problems: (reviewed 10/17/23) Recent Hospitalization for treatment of COPD and pneumonia Equipment/DME barrier [employer] has not delivered CPAP mask and tubing per PCP provider orders per patient.  09/27/23. The patient states she does have her CPAP but the tubing piece delivered is inadequate 10/04/23 - The patient has what she needs for her CPAP supplies  Goal:  (reviewed 10/17/23) Over the next 30 days, the patient will not experience hospital readmission  Interventions: (reviewed 10/17/23) Transitions of Care: Doctor Visits  - discussed the importance of doctor visits Troubleshoot use of DME in the home  Contacted DME company for patient use of equipment Repeat Chest XR as directed by the provider Take all prescribed medication for Pneumonia and COPD Take adequate rest breaks as needed for SOB due to anemia  Patient Self Care Activities: (reviewed 10/17/23) Attend all scheduled provider appointments Call pharmacy for medication refills 3-7 days in advance of running out of medications Call provider office for new concerns or questions  Notify RN Care Manager of TOC call rescheduling needs Participate in Transition of Care Program/Attend TOC scheduled calls Take medications as prescribed    Plan:  Telephone follow up appointment with care management team member scheduled for:  The patient declines transfer to Longitudinal CCM        Patient verbalizes understanding of instructions and care plan provided today and agrees to view in MyChart. Active MyChart status and patient understanding of how to access instructions and care plan via  MyChart confirmed with patient.     The patient has been provided with contact information for the care management team and has been advised to call with any health related questions or concerns.   Please call the care guide team at [phone] if you need to cancel or reschedule your appointment.   Please call the Suicide and Crisis Lifeline: 988 call the [place]  National Suicide Prevention Lifeline: (779) 171-7362 or TTY: 253-580-6017 TTY 5742501578) to talk to a trained counselor if you are experiencing a Mental Health or Behavioral Health Crisis or need someone to talk to.  Medford Balboa, BSN, RN [place]  VBCI - [employer] [facility] RN Care Manager [phone]

## 2023-10-24 ENCOUNTER — Encounter: Payer: Self-pay | Admitting: Medical

## 2023-10-24 ENCOUNTER — Ambulatory Visit: Attending: Medical | Admitting: Medical

## 2023-10-24 VITALS — BP 120/50 | HR 70 | Ht 63.0 in | Wt 182.4 lb

## 2023-10-24 DIAGNOSIS — I2510 Atherosclerotic heart disease of native coronary artery without angina pectoris: Secondary | ICD-10-CM | POA: Diagnosis not present

## 2023-10-24 DIAGNOSIS — E782 Mixed hyperlipidemia: Secondary | ICD-10-CM | POA: Diagnosis not present

## 2023-10-24 DIAGNOSIS — R0609 Other forms of dyspnea: Secondary | ICD-10-CM | POA: Diagnosis not present

## 2023-10-24 DIAGNOSIS — I5032 Chronic diastolic (congestive) heart failure: Secondary | ICD-10-CM | POA: Diagnosis not present

## 2023-10-24 DIAGNOSIS — I10 Essential (primary) hypertension: Secondary | ICD-10-CM

## 2023-10-24 NOTE — Progress Notes (Signed)
 Cardiology Office Note   Date:  10/24/2023  ID:  Raiya, Stainback 11-07-1950, MRN [id number] PCP: [name], MD  [facility] Providers Cardiologist:  Redell Cave, MD   History of Present Illness ASHAWNTI TANGEN is a 73 y.o. female with a hx of hypertension, hyperlipidemia, hypothyroidism, MDS and macrocytic anemia followed by oncology, HFpEF, asthma who presents for 6 month follow-up.   Echo in 2020 showed EF of 60 to 65%.  Lexiscan  Myoview  in 2020 was low risk with no ischemia.   Patient was seen as a new patient in June 2024 for shortness of breath.  Patient had been admitted to the hospital 2 months prior with viral pneumonia.  She denied any chest pain.  Echocardiogram and cardiac CT were ordered.  Cardiac CTA showed a coronary calcium  score 119, 60th percentile for age and sex matched, mild proximal LAD stenosis 25 to 49%, overall mild nonobstructive CAD. Echo showed LVEF 55-60%, G2DD, mild MR.    The patient was last seen 02/2023 reporting unchanged SOB.   The patient was admitted in 08/2023 for Acute hypoxic respiratory failure, COPD exacerbation, PNA treated with antibiotics and steroids.   Today, the patient reports she is still a little tired since the hospitalization. She denies chest pain, but still feels breathing is still a little short. She has occasional lower leg edema. She does not eat a low salt diet, but does not put salt on her food. She uses a CPAP  Studies Reviewed EKG Interpretation Date/Time:  Tuesday October 24 2023 14:06:50 EDT Ventricular Rate:  70 PR Interval:  140 QRS Duration:  144 QT Interval:  424 QTC Calculation: 457 R Axis:   92  Text Interpretation: Normal sinus rhythm Right bundle branch block Septal infarct , age undetermined When compared with ECG of 15-Aug-2022 15:46, Right bundle branch block is now Present Confirmed by Franchester, Domenic Schoenberger ([id number]) on 10/24/2023 2:27:08 PM    Cardiac CT 10/2022 IMPRESSION: 1. Coronary calcium   score of 119. This was 68th percentile for age and sex matched control.   2. Normal coronary origin with right dominance.   3. Mild proximal LAD stenosis (25-49%).   4. CAD-RADS 2. Mild non-obstructive CAD (25-49%). Consider non-atherosclerotic causes of chest pain. Consider preventive therapy and risk factor modification.   Echo 10/2022 1. Left ventricular ejection fraction, by estimation, is 55 to 60%. Left  ventricular ejection fraction by 2D MOD biplane is 58.5 %. The left  ventricle has normal function. The left ventricle has no regional wall  motion abnormalities. Left ventricular  diastolic parameters are consistent with Grade II diastolic dysfunction  (pseudonormalization).   2. Right ventricular systolic function is normal. The right ventricular  size is normal. There is mildly elevated pulmonary artery systolic  pressure.   3. Left atrial size was mildly dilated.   4. The mitral valve is normal in structure. Mild mitral valve  regurgitation.   5. The aortic valve was not well visualized. Aortic valve regurgitation  is not visualized. Aortic valve mean gradient measures 6.0 mmHg.   6. The inferior vena cava is normal in size with <50% respiratory  variability, suggesting right atrial pressure of 8 mmHg.       Physical Exam VS:  BP (!) 120/50 (BP Location: Left Arm, Patient Position: Sitting, Cuff Size: Normal)   Pulse 70   Ht 5' 3 (1.6 m)   Wt 182 lb 6.4 oz (82.7 kg)   SpO2 96%   BMI 32.31 kg/m  Wt Readings from Last 3 Encounters:  10/24/23 182 lb 6.4 oz (82.7 kg)  10/11/23 181 lb (82.1 kg)  09/27/23 181 lb (82.1 kg)    GEN: Well nourished, well developed in no acute distress NECK: No JVD; No carotid bruits CARDIAC: RRR, no murmurs, rubs, gallops RESPIRATORY:  Clear to auscultation without rales, wheezing or rhonchi  ABDOMEN: Soft, non-tender, non-distended EXTREMITIES:  trace lower leg edema; No deformity   ASSESSMENT AND PLAN  Nonobstructive  CAD Cardiac CTA in 2024 showed nonobstructive CAD. She denies chest pain. No further ischemic work-up at this time. No ASA given MDS/Anemia/thrombocytopenia. Continue Crestor .   Diastolic dysfunction Echo showed preserved LVEF with G2DD. She reports breathing is a little worse, however she was admitted for PNA last month. She also has lung issues (Asthma, OSA), and MDS/anemia/thrombocytopenia that are contributing to breathing issues. She trace edema on exam. Recommended low salt diet. I will update an echocardiogram. I recommended low salt diet.   HTN BP is normal. Continue amlodipine  5mg  daily.   HLD LDL 58. Continue Crestor  10mg  daily.         Dispo: Follow-up in 6 months  Signed, Stephanie Littman VEAR Fishman, PA-C

## 2023-10-24 NOTE — Patient Instructions (Signed)
 Medication Instructions:  Your physician recommends that you continue on your current medications as directed. Please refer to the Current Medication list given to you today.    *If you need a refill on your cardiac medications before your next appointment, please call your pharmacy*  Lab Work: No labs ordered today    Testing/Procedures: Your physician has requested that you have an echocardiogram. Echocardiography is a painless test that uses sound waves to create images of your heart. It provides your doctor with information about the size and shape of your heart and how well your heart's chambers and valves are working.   You may receive an ultrasound enhancing agent through an IV if needed to better visualize your heart during the echo. This procedure takes approximately one hour.  There are no restrictions for this procedure.  This will take place at [address] [facility] [address], [place] [address]  Please note: We ask at that you not bring children with you during ultrasound (echo/ vascular) testing. Due to room size and safety concerns, children are not allowed in the ultrasound rooms during exams. Our front office staff cannot provide observation of children in our lobby area while testing is being conducted. An adult accompanying a patient to their appointment will only be allowed in the ultrasound room at the discretion of the ultrasound technician under special circumstances. We apologize for any inconvenience.   Follow-Up: At [facility], you and your health needs are our priority.  As part of our continuing mission to provide you with exceptional heart care, our providers are all part of one team.  This team includes your primary Cardiologist (physician) and Advanced Practice Providers or APPs (Physician Assistants and Nurse Practitioners) who all work together to provide you with the care you need, when you need it.  Your next appointment:   6  month(s)  Provider:   Redell Cave, MD or Cadence Franchester, PA-C

## 2023-11-03 ENCOUNTER — Ambulatory Visit: Payer: Medicare Other | Admitting: Internal Medicine

## 2023-11-06 ENCOUNTER — Encounter: Payer: Self-pay | Admitting: Internal Medicine

## 2023-11-06 ENCOUNTER — Ambulatory Visit: Admitting: Internal Medicine

## 2023-11-06 VITALS — BP 120/64 | HR 71 | Ht 63.0 in | Wt 186.0 lb

## 2023-11-06 DIAGNOSIS — Z8709 Personal history of other diseases of the respiratory system: Secondary | ICD-10-CM

## 2023-11-06 DIAGNOSIS — J189 Pneumonia, unspecified organism: Secondary | ICD-10-CM

## 2023-11-06 DIAGNOSIS — D46Z Other myelodysplastic syndromes: Secondary | ICD-10-CM

## 2023-11-06 DIAGNOSIS — I10 Essential (primary) hypertension: Secondary | ICD-10-CM | POA: Diagnosis not present

## 2023-11-06 DIAGNOSIS — D539 Nutritional anemia, unspecified: Secondary | ICD-10-CM

## 2023-11-06 DIAGNOSIS — Z09 Encounter for follow-up examination after completed treatment for conditions other than malignant neoplasm: Secondary | ICD-10-CM | POA: Diagnosis not present

## 2023-11-06 DIAGNOSIS — G4733 Obstructive sleep apnea (adult) (pediatric): Secondary | ICD-10-CM | POA: Diagnosis not present

## 2023-11-06 DIAGNOSIS — J479 Bronchiectasis, uncomplicated: Secondary | ICD-10-CM

## 2023-11-06 MED ORDER — SOLIFENACIN SUCCINATE 5 MG PO TABS: 5.0000 mg | ORAL_TABLET | Freq: Every day | ORAL | 2 refills | Status: DC

## 2023-11-06 MED ORDER — AZITHROMYCIN 250 MG PO TABS: 250.0000 mg | ORAL_TABLET | ORAL | 3 refills | Status: DC

## 2023-11-06 MED ORDER — BENZONATATE 200 MG PO CAPS: 200.0000 mg | ORAL_CAPSULE | Freq: Three times a day (TID) | ORAL | 2 refills | Status: DC | PRN

## 2023-11-06 NOTE — Progress Notes (Unsigned)
 Subjective:  Patient ID: Sarah [name], female    DOB: 1950-05-27  Age: 73 y.o. MRN: [id number]  CC: There were no encounter diagnoses.   HPI Sarah [name] presents for  Chief Complaint  Patient presents with   Hospitalization Follow-up   Admitted to [facility]  on May 12 with acute hypoxic resp failure secondary to COPD exacerbation and RML pneumonia secondary to vomiting and aspiration of vomitus   Discharged on may 17 with steroids   During follow up with Dr Tamea she was prescribed  azithromycin  250 mg MWF  for management of bronchiectasis.  She has   missed 2 doses.  Has not completed dose Repeat chest x ray June 3 pNA resolved .   She is not aware she is supposed to say on it long term  Received CPAP supplies but also received a bill for $40    Outpatient Medications Prior to Visit  Medication Sig Dispense Refill   albuterol  (VENTOLIN  HFA) 108 (90 Base) MCG/ACT inhaler Inhale 1-2 puffs into the lungs every 6 (six) hours as needed for wheezing or shortness of breath. 8.5 g 11   amLODipine  (NORVASC ) 5 MG tablet TAKE 1 TABLET BY MOUTH DAILY 100 tablet 2   azithromycin  (ZITHROMAX ) 250 MG tablet Take 1 tablet (250 mg total) by mouth 3 (three) times a week. M-W-F 12 each 1   benzonatate  (TESSALON ) 200 MG capsule Take 1 capsule (200 mg total) by mouth 3 (three) times daily as needed for cough. 30 capsule 0   Calcium  Citrate-Vitamin D  (CALCIUM  CITRATE + D PO) Take 1 tablet by mouth 2 (two) times daily.     citalopram  (CELEXA ) 20 MG tablet TAKE 1 TABLET BY MOUTH DAILY 100 tablet 3   famotidine  (PEPCID ) 20 MG tablet TAKE 1 TABLET BY MOUTH DAILY. BEFORE DINNER 30 tablet 11   ferrous sulfate  324 MG TBEC Take 2 tablets by mouth. 65 mg of elemental iron     fexofenadine (ALLEGRA) 180 MG tablet Take 180 mg by mouth daily.     Fluticasone -Umeclidin-Vilant (TRELEGY ELLIPTA ) 200-62.5-25 MCG/ACT AEPB Inhale 1 puff into the lungs daily. 60 each 6   Fluticasone -Umeclidin-Vilant (TRELEGY ELLIPTA )  200-62.5-25 MCG/ACT AEPB Inhale 1 puff into the lungs daily. 28 each 0   gabapentin  (NEURONTIN ) 100 MG capsule TAKE 1 CAPSULE BY MOUTH 3 TIMES  DAILY 300 capsule 0   levothyroxine  (SYNTHROID ) 50 MCG tablet TAKE 1 TABLET BY MOUTH DAILY ON  AN EMPTY STOMACH WITH A GLASS OF WATER AT LEAST 30 TO 60 MINUTES  BEFORE BREAKFAST 100 tablet 2   pantoprazole  (PROTONIX ) 40 MG tablet TAKE 1 TABLET BY MOUTH DAILY 100 tablet 2   rosuvastatin  (CRESTOR ) 10 MG tablet TAKE ONE TABLET BY MOUTH ONCE DAILY 100 tablet 1   solifenacin  (VESICARE ) 5 MG tablet TAKE ONE TABLET BY MOUTH EVERY DAY 30 tablet 2   traZODone  (DESYREL ) 50 MG tablet TAKE 1/2 TO 1 TABLET BY MOUTH AT BEDTIME AS NEEDED FOR SLEEP 90 tablet 3   vitamin B-12 (CYANOCOBALAMIN ) 1000 MCG tablet Take 1,000 mcg by mouth daily. (Patient not taking: Reported on 10/24/2023)     No facility-administered medications prior to visit.    Review of Systems;  Patient denies headache, fevers, malaise, unintentional weight loss, skin rash, eye pain, sinus congestion and sinus pain, sore throat, dysphagia,  hemoptysis , cough, dyspnea, wheezing, chest pain, palpitations, orthopnea, edema, abdominal pain, nausea, melena, diarrhea, constipation, flank pain, dysuria, hematuria, urinary  Frequency, nocturia, numbness, tingling, seizures,  Focal weakness, Loss of consciousness,  Tremor, insomnia, depression, anxiety, and suicidal ideation.      Objective:  There were no vitals taken for this visit.  BP Readings from Last 3 Encounters:  10/24/23 (!) 120/50  09/26/23 118/68  09/09/23 131/69    Wt Readings from Last 3 Encounters:  10/24/23 182 lb 6.4 oz (82.7 kg)  10/11/23 181 lb (82.1 kg)  09/27/23 181 lb (82.1 kg)    Physical Exam  Lab Results  Component Value Date   HGBA1C 5.9 11/21/2022   HGBA1C 6.3 (H) 08/15/2022   HGBA1C 6.1 09/15/2021    Lab Results  Component Value Date   CREATININE 0.89 09/07/2023   CREATININE 0.91 09/06/2023   CREATININE 1.21 (H)  09/05/2023    Lab Results  Component Value Date   WBC 9.1 09/07/2023   HGB 9.0 (L) 09/07/2023   HCT 26.9 (L) 09/07/2023   PLT 93 (L) 09/07/2023   GLUCOSE 112 (H) 09/07/2023   CHOL 152 11/21/2022   TRIG 58.0 11/21/2022   HDL 72.40 11/21/2022   LDLDIRECT 58.0 11/21/2022   LDLCALC 68 11/21/2022   ALT 17 09/04/2023   AST 17 09/04/2023   NA 143 09/07/2023   K 3.5 09/07/2023   CL 102 09/07/2023   CREATININE 0.89 09/07/2023   BUN 19 09/07/2023   CO2 30 09/07/2023   TSH 0.82 11/21/2022   INR 1.1 08/16/2022   HGBA1C 5.9 11/21/2022    DG Chest 2 View Result Date: 10/01/2023 CLINICAL DATA:  Aspiration PNA f/u. EXAM: CHEST - 2 VIEW COMPARISON:  09/06/2023. FINDINGS: Bilateral lung fields are clear. There is complete clearing of previously noted heterogeneous opacities overlying the right mid lower lung zones. Bilateral costophrenic angles are clear. Normal cardio-mediastinal silhouette. No acute osseous abnormalities. Lower cervical spinal fixation hardware noted. The soft tissues are within normal limits. IMPRESSION: No active cardiopulmonary disease. Electronically Signed   By: Ree Molt M.D.   On: 10/01/2023 12:59    Assessment & Plan:  .There are no diagnoses linked to this encounter.   I spent 34 minutes on the day of this face to face encounter reviewing patient's  most recent visit with cardiology,  nephrology,  and neurology,  prior relevant surgical and non surgical procedures, recent  labs and imaging studies, counseling on weight management,  reviewing the assessment and plan with patient, and post visit ordering and reviewing of  diagnostics and therapeutics with patient  .   Follow-up: No follow-ups on file.   Verneita LITTIE Kettering, MD

## 2023-11-06 NOTE — Assessment & Plan Note (Signed)
Patient is stable post discharge and has no new issues or questions about discharge plans at the visit today for hospital follow up. All labs , imaging studies and progress notes from admission were reviewed with patient today   

## 2023-11-06 NOTE — Patient Instructions (Addendum)
 Call  Pulmonary in  [place] and ask to get an appt with Tammy Parrett ASAP so your CPAP can stay covered by insurance    Continue azithromycin  MONDAY WEDNESDAY AND FRIDAYS ,   THIS IS THE ANTIBIOTIC DR GONZALEZ WANTS YOU TO STAY ON.    I HAVE REFILLED IT   THE [facility]  ARE COUGH TABLETS . I have refilled them   DR [facility] HAS SCHEDULED YOU A LAB APPOINTMENT THE MORNING OF YOUR VISIT WITH HIM ON  AUGUST 8

## 2023-11-06 NOTE — Assessment & Plan Note (Signed)
 Secondary to MDS. ,  With normal iron, folate  and b12 studies.   Now with thrombocytenia as well.   SPEP noted a faintly positive band of restricted protein,  But urine IFE was normal.  GFR is normal as well.  Continue close follow up with Dr Rennie, hgb has been stable   Lab Results  Component Value Date   WBC 9.1 09/07/2023   HGB 9.0 (L) 09/07/2023   HCT 26.9 (L) 09/07/2023   MCV 114.0 (H) 09/07/2023   PLT 93 (L) 09/07/2023      Lab Results  Component Value Date   IRON 93 08/02/2023   TIBC 354 08/02/2023   FERRITIN 32 08/02/2023   Lab Results  Component Value Date   VITAMINB12 7,134 (H) 08/02/2023    Lab Results  Component Value Date   FOLATE >23.8 11/21/2022   Lab Results  Component Value Date   CREATININE 0.89 09/07/2023

## 2023-11-06 NOTE — Assessment & Plan Note (Signed)
 Aspiration induced

## 2023-11-07 DIAGNOSIS — J479 Bronchiectasis, uncomplicated: Secondary | ICD-10-CM | POA: Insufficient documentation

## 2023-11-07 NOTE — Assessment & Plan Note (Signed)
 Reviewed recent admission , follow up with pulmonology and current medications.

## 2023-11-07 NOTE — Assessment & Plan Note (Signed)
 Hgb has been above the threshold for transufions and stable. , Managed by Oncology,  next appt August   Lab Results  Component Value Date   WBC 9.1 09/07/2023   HGB 9.0 (L) 09/07/2023   HCT 26.9 (L) 09/07/2023   MCV 114.0 (H) 09/07/2023   PLT 93 (L) 09/07/2023

## 2023-11-07 NOTE — Assessment & Plan Note (Signed)
 Well controlled on current regimen of amlodipine  5 mg daily . Renal function stable, no changes today.  Lab Results  Component Value Date   CREATININE 0.89 09/07/2023

## 2023-11-07 NOTE — Assessment & Plan Note (Signed)
 Explained the diagnosis to patient and reinforced need to continue azithromycing MWF per Dr Tamea

## 2023-11-07 NOTE — Assessment & Plan Note (Signed)
 With recent lapse in use of CPA due to  lack of supplies.  Advised to follow up with Tammy Parrett in [place] office

## 2023-11-09 ENCOUNTER — Other Ambulatory Visit: Payer: Self-pay | Admitting: Internal Medicine

## 2023-11-13 ENCOUNTER — Ambulatory Visit: Attending: Medical

## 2023-11-13 DIAGNOSIS — R0609 Other forms of dyspnea: Secondary | ICD-10-CM

## 2023-11-13 LAB — ECHOCARDIOGRAM COMPLETE
AR max vel: 2.24 cm2
AV Area VTI: 2.24 cm2
AV Area mean vel: 2.21 cm2
AV Mean grad: 8 mmHg
AV Peak grad: 14.7 mmHg
Ao pk vel: 1.92 m/s
Area-P 1/2: 3.12 cm2
MV VTI: 1.49 cm2
S' Lateral: 4.58 cm

## 2023-11-16 ENCOUNTER — Ambulatory Visit: Payer: Self-pay | Admitting: Medical

## 2023-11-17 ENCOUNTER — Telehealth: Payer: Self-pay | Admitting: Medical

## 2023-11-17 NOTE — Telephone Encounter (Signed)
 Patient returned call

## 2023-11-17 NOTE — Telephone Encounter (Signed)
 Called and spoke with the patient regarding the ECHO results as interpreted by Cadence Furth, PA-C.   [place], Cadence H, PA-C to Sarah Russell SAILOR, RN    11/16/23 10:45 AM Result Note Echo showed normal pump function with normal relaxation. Mild valve disease. Overall looks good! ECHOCARDIOGRAM COMPLETE  Patient verbalized understanding with all questions and concerns addressed at this time.

## 2023-12-01 ENCOUNTER — Ambulatory Visit: Admitting: Internal Medicine

## 2023-12-01 ENCOUNTER — Other Ambulatory Visit

## 2023-12-01 ENCOUNTER — Ambulatory Visit: Admitting: Nurse Practitioner

## 2023-12-05 ENCOUNTER — Telehealth: Payer: Self-pay | Admitting: Pharmacy Technician

## 2023-12-05 ENCOUNTER — Encounter: Payer: Self-pay | Admitting: Internal Medicine

## 2023-12-05 ENCOUNTER — Other Ambulatory Visit (HOSPITAL_COMMUNITY): Payer: Self-pay

## 2023-12-05 ENCOUNTER — Inpatient Hospital Stay (HOSPITAL_BASED_OUTPATIENT_CLINIC_OR_DEPARTMENT_OTHER): Admitting: Internal Medicine

## 2023-12-05 ENCOUNTER — Inpatient Hospital Stay: Attending: Internal Medicine

## 2023-12-05 ENCOUNTER — Telehealth: Payer: Self-pay | Admitting: Pharmacist

## 2023-12-05 VITALS — BP 116/70 | HR 66 | Temp 97.3°F | Resp 18 | Ht 63.0 in | Wt 187.7 lb

## 2023-12-05 DIAGNOSIS — R531 Weakness: Secondary | ICD-10-CM | POA: Diagnosis not present

## 2023-12-05 DIAGNOSIS — G629 Polyneuropathy, unspecified: Secondary | ICD-10-CM | POA: Insufficient documentation

## 2023-12-05 DIAGNOSIS — M1711 Unilateral primary osteoarthritis, right knee: Secondary | ICD-10-CM | POA: Diagnosis not present

## 2023-12-05 DIAGNOSIS — I10 Essential (primary) hypertension: Secondary | ICD-10-CM | POA: Diagnosis not present

## 2023-12-05 DIAGNOSIS — M2550 Pain in unspecified joint: Secondary | ICD-10-CM | POA: Insufficient documentation

## 2023-12-05 DIAGNOSIS — D469 Myelodysplastic syndrome, unspecified: Secondary | ICD-10-CM | POA: Diagnosis not present

## 2023-12-05 DIAGNOSIS — D46Z Other myelodysplastic syndromes: Secondary | ICD-10-CM

## 2023-12-05 DIAGNOSIS — Z90710 Acquired absence of both cervix and uterus: Secondary | ICD-10-CM | POA: Insufficient documentation

## 2023-12-05 DIAGNOSIS — E785 Hyperlipidemia, unspecified: Secondary | ICD-10-CM | POA: Diagnosis not present

## 2023-12-05 DIAGNOSIS — Z8249 Family history of ischemic heart disease and other diseases of the circulatory system: Secondary | ICD-10-CM | POA: Diagnosis not present

## 2023-12-05 DIAGNOSIS — R5383 Other fatigue: Secondary | ICD-10-CM | POA: Diagnosis not present

## 2023-12-05 DIAGNOSIS — Z83438 Family history of other disorder of lipoprotein metabolism and other lipidemia: Secondary | ICD-10-CM | POA: Insufficient documentation

## 2023-12-05 DIAGNOSIS — Z79899 Other long term (current) drug therapy: Secondary | ICD-10-CM | POA: Diagnosis not present

## 2023-12-05 DIAGNOSIS — Z833 Family history of diabetes mellitus: Secondary | ICD-10-CM | POA: Diagnosis not present

## 2023-12-05 LAB — CMP (CANCER CENTER ONLY)
ALT: 12 U/L (ref 0–44)
AST: 13 U/L — ABNORMAL LOW (ref 15–41)
Albumin: 3.9 g/dL (ref 3.5–5.0)
Alkaline Phosphatase: 56 U/L (ref 38–126)
Anion gap: 6 (ref 5–15)
BUN: 15 mg/dL (ref 8–23)
CO2: 27 mmol/L (ref 22–32)
Calcium: 9.5 mg/dL (ref 8.9–10.3)
Chloride: 105 mmol/L (ref 98–111)
Creatinine: 0.76 mg/dL (ref 0.44–1.00)
GFR, Estimated: >60 mL/min (ref >60)
Glucose, Bld: 100 mg/dL — ABNORMAL HIGH (ref 70–99)
Potassium: 3.7 mmol/L (ref 3.5–5.1)
Sodium: 138 mmol/L (ref 135–145)
Total Bilirubin: 0.5 mg/dL (ref 0.0–1.2)
Total Protein: 6.7 g/dL (ref 6.5–8.1)

## 2023-12-05 LAB — CBC WITH DIFFERENTIAL (CANCER CENTER ONLY)
Abs Immature Granulocytes: 0.11 K/uL — ABNORMAL HIGH (ref 0.00–0.07)
Basophils Absolute: 0.1 K/uL (ref 0.0–0.1)
Basophils Relative: 2 %
Eosinophils Absolute: 0.2 K/uL (ref 0.0–0.5)
Eosinophils Relative: 5 %
HCT: 28.2 % — ABNORMAL LOW (ref 36.0–46.0)
Hemoglobin: 9.5 g/dL — ABNORMAL LOW (ref 12.0–15.0)
Immature Granulocytes: 3 %
Lymphocytes Relative: 28 %
Lymphs Abs: 1.2 K/uL (ref 0.7–4.0)
MCH: 39.6 pg — ABNORMAL HIGH (ref 26.0–34.0)
MCHC: 33.7 g/dL (ref 30.0–36.0)
MCV: 117.5 fL — ABNORMAL HIGH (ref 80.0–100.0)
Monocytes Absolute: 0.4 K/uL (ref 0.1–1.0)
Monocytes Relative: 10 %
Neutro Abs: 2.2 K/uL (ref 1.7–7.7)
Neutrophils Relative %: 52 %
Platelet Count: 112 K/uL — ABNORMAL LOW (ref 150–400)
RBC: 2.4 MIL/uL — ABNORMAL LOW (ref 3.87–5.11)
RDW: 13.8 % (ref 11.5–15.5)
WBC Count: 4.3 K/uL (ref 4.0–10.5)
nRBC: 0 % (ref 0.0–0.2)

## 2023-12-05 LAB — IRON AND TIBC
Iron: 83 ug/dL (ref 28–170)
Saturation Ratios: 28 % (ref 10.4–31.8)
TIBC: 300 ug/dL (ref 250–450)
UIBC: 217 ug/dL

## 2023-12-05 LAB — VITAMIN B12: Vitamin B-12: 1427 pg/mL — ABNORMAL HIGH (ref 180–914)

## 2023-12-05 LAB — LACTATE DEHYDROGENASE: LDH: 134 U/L (ref 98–192)

## 2023-12-05 LAB — FERRITIN: Ferritin: 43 ng/mL (ref 11–307)

## 2023-12-05 MED ORDER — ONDANSETRON HCL 8 MG PO TABS: ORAL_TABLET | ORAL | 1 refills | Status: DC

## 2023-12-05 NOTE — Telephone Encounter (Signed)
 Oral Oncology Patient Advocate Encounter  Was successful in securing patient a [phone] grant from [facility] to provide copayment coverage for Lenalidomide .  This will keep the out of pocket expense at $0.     [employer] ID: [id number]   The billing information is as follows and will be shared with appropriate pharmacy.    RxBin: [id number] PCN: [id number] Member ID: [id number] Group ID: [id number] Dates of Eligibility: 11/05/2023 through 11/03/2024  Fund:  Myelodysplastic Syndromes - Medicare Access  [place] Chet Burnet, CPhT  [facility], [place], [place] Oral Chemotherapy Patient Advocate Specialist III Phone: [phone]  Fax: [phone]

## 2023-12-05 NOTE — Progress Notes (Signed)
 [facility] CONSULT NOTE  Patient Care Team: Sarah Verneita CROME, MD as PCP - General (Internal Medicine) Sarah Rogue, MD as PCP - Cardiology (Cardiology) Sarah Verneita CROME, MD (Internal Medicine) Sarah [name], Sarah ORN, MD (General Surgery) Sarah Cindy SAUNDERS, MD as Consulting Physician (Oncology)  CHIEF COMPLAINTS/PURPOSE OF CONSULTATION: MDS  HEMATOLOGY HISTORY: Oncology History Overview Note  DEC 2023- DIAGNOSIS: LOW GRADE MDS- 5 Q DEL;   # MACROCYTIC ANEMIA [since 2022- April hb 11; MCV-103; platelet- 120s] EGD/ colonoscopy >>> 10 years ago; OCT 2022-slight improvement blood counts/stable; hepatitis HIV negative.  Myeloma panel negative.   BONE MARROW, ASPIRATE, CLOT, CORE: -Hypercellular bone marrow for age with dyspoietic changes -See comment  PERIPHERAL BLOOD: -Macrocytic anemia -Thrombocytopenia  COMMENT:  The overall findings are very concerning for involvement by a low-grade myelodysplastic syndrome.  The differential diagnosis includes changes related to nutritional deficiency, medication, immune mediated process, infection, etc. Correlation with cytogenetic and FISH studies is recommended.  MICROSCOPIC DESCRIPTION:  PERIPHERAL BLOOD SMEAR: The red blood cells display mild anisopoikilocytosis with mild polychromasia.  The white blood cells are normal in number with occasional hypogranular and/or hypolobated neutrophils.  Scattered reactive appearing lymphocytes are present.  The platelets are decreased in number.  BONE MARROW ASPIRATE: Bone marrow particles present. Erythroid precursors: Progressive maturation with occasional late precursors displaying nuclear cytoplasmic dyssynchrony. Granulocytic precursors: Progressive maturation with many mature neutrophils displaying hypogranulation and/or hypolobation.  No increase in blastic cells identified. Megakaryocytes: Abundant with many small and/or  hypolobated/unilobated forms Lymphocytes/plasma cells: Large aggregates not present  TOUCH PREPARATIONS: A mixture of cell types present.  CLOT AND BIOPSY: The sections show variable cellularity ranging from 10% focally to 70% with a mixture of cell types.  Expansile sheets of blastic cells are not identified.  A small interstitial lymphoid aggregate composed of small lymphoid cells is seen.  IRON STAIN: Iron stains are performed on a bone marrow aspirate or touch imprint smear and section of clot. The controls stained appropriately.       Storage Iron:  Abundant      Ring Sideroblasts: Absent  ADDITIONAL DATA/TESTING: The specimen was sent for cytogenetic analysis and FISH for MDS and a separate report will follow. Flow cytometric analysis (WLS [phone]) was performed and shows T cells with nonspecific changes.  No monoclonal B-cell population or significant CD34 positive blastic population identified.  # LATE AUG 2025- start rev 10 mg- 3 w/1 w     #Incidental right hepatic lobe 13 mm hypoechoic lesion [ultrasound-October 2022]-MRI OCT 2022-   Low clinical concern for metastatic disease; however need to rule out primary HCC versus others.  1. There are 3 small liver lesions, 2 of which represent simple cysts. 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 MAY 2023-abdominal MRI -benign cyst no further surveillance recommended.   #October 2022-ultrasound incidental bilateral mild to moderate hydronephrosis.-Rs/p evaluation with Dr.Brandon urology.     MDS (myelodysplastic syndrome), low grade (HCC)  04/20/2022 Initial Diagnosis   MDS (myelodysplastic syndrome), low grade (HCC)    HISTORY OF PRESENTING ILLNESS: Ambulating independently.  With friend.   Sarah [name] 73 y.o.  female is here for follow-up today re: low grade MDS with 5 q del is here for a follow up.  In the interim patient was admitted to  hospital for pneumonia in May 2025.  She continues to feel weak.   Also complains of worsening neuropathy in the legs.  Appetite  is good. Right knee arthritis.    Denies any blood in stools or black-colored stools.  Patient denies any nausea vomiting. Chronic mild easy bruising.  No tremors.  Review of Systems  Constitutional:  Positive for malaise/fatigue. Negative for chills, diaphoresis, fever and weight loss.  HENT:  Negative for nosebleeds and sore throat.   Eyes:  Negative for double vision.  Respiratory:  Negative for cough, hemoptysis, sputum production, shortness of breath and wheezing.   Cardiovascular:  Negative for chest pain, palpitations, orthopnea and leg swelling.  Gastrointestinal:  Negative for abdominal pain, blood in stool, constipation, diarrhea, heartburn, melena, nausea and vomiting.  Genitourinary:  Negative for dysuria, frequency and urgency.  Musculoskeletal:  Positive for joint pain. Negative for back pain.  Skin: Negative.  Negative for itching and rash.  Neurological:  Negative for dizziness, tingling, focal weakness, weakness and headaches.  Endo/Heme/Allergies:  Bruises/bleeds easily.  Psychiatric/Behavioral:  Negative for depression. The patient is not nervous/anxious and does not have insomnia.     MEDICAL HISTORY:  Past Medical History:  Diagnosis Date   Arthritis    Asthma    CAP (community acquired pneumonia) 08/11/2022   Depression    Dyspnea    with heat   Environmental and seasonal allergies    GERD (gastroesophageal reflux disease)    Hyperlipidemia    Hypertension    Hypothyroidism    Presence of dental prosthetic device    Dental implants   Wheezing     SURGICAL HISTORY: Past Surgical History:  Procedure Laterality Date   ABDOMINAL HYSTERECTOMY  04/26/1987   ANTERIOR CERVICAL DECOMPRESSION/DISCECTOMY FUSION 4 LEVELS N/A 11/10/2021   Procedure: C3-7 ANTERIOR CERVICAL DISCECTOMY AND FUSION (GLOBUS FORGE);  Surgeon: Clois Fret, MD;  Location: [facility] OR[facility];  Service: Neurosurgery;  Laterality: N/A;   BROW LIFT Bilateral 02/07/2020   Procedure: BLEPHAROPLASTY UPPER EYELID; W/EXCESS SKIN BROW PTOSIS REPAIR BILATERAL;  Surgeon: Ashley Greig HERO, MD;  Location: [facility];  Service: Ophthalmology;  Laterality: Bilateral;   CATARACT EXTRACTION W/PHACO Right 09/13/2016   Procedure: CATARACT EXTRACTION PHACO AND INTRAOCULAR LENS PLACEMENT (IOC);  Surgeon: Jaye Fallow, MD;  Location: [facility];  Service: Ophthalmology;  Laterality: Right;  US  00:38 AP% 14.3 CDE 5.51 Fluid pack lot # [id number]   CATARACT EXTRACTION W/PHACO Left 10/11/2016   Procedure: CATARACT EXTRACTION PHACO AND INTRAOCULAR LENS PLACEMENT (IOC);  Surgeon: Jaye Fallow, MD;  Location: [facility];  Service: Ophthalmology;  Laterality: Left;  US  00:30 AP% 11.3 CDE 3.50 fluid pack lot # [id number]   COLONOSCOPY WITH PROPOFOL  N/A 09/30/2014   Procedure: COLONOSCOPY WITH PROPOFOL ;  Surgeon: Sarah LELON Cota, MD;  Location[facility];  Service: Endoscopy;  Laterality: N/A;   COLONOSCOPY WITH PROPOFOL  N/A 03/24/2021   Procedure: COLONOSCOPY WITH PROPOFOL ;  Surgeon: Unk Corinn Skiff, MD;  Location: [facility];  Service: Gastroenterology;  Laterality: N/A;   ESOPHAGOGASTRODUODENOSCOPY N/A 09/30/2014   Procedure: ESOPHAGOGASTRODUODENOSCOPY (EGD);  Surgeon: Sarah LELON Cota, MD;  Location[facility];  Service: Endoscopy;  Laterality: N/A;   ESOPHAGOGASTRODUODENOSCOPY N/A 03/24/2021   Procedure: ESOPHAGOGASTRODUODENOSCOPY (EGD);  Surgeon: Unk Corinn Skiff, MD;  Location: [facility] END[facility]COPY;  Service: Gastroenterology;  Laterality: N/A;   FOOT SURGERY Bilateral 04/26/1983   KNEE SURGERY  04/25/2014    SOCIAL HISTORY: Social History   Socioeconomic History   Marital status: Single    Spouse name: Not on file   Number of children: Not on file   Years of education: Not on file   Highest education level: Not  on file  Occupational  History   Not on file  Tobacco Use   Smoking status: Never   Smokeless tobacco: Never  Vaping Use   Vaping status: Never Used  Substance and Sexual Activity   Alcohol use: No   Drug use: No   Sexual activity: Not Currently  Other Topics Concern   Not on file  Social History Narrative   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 No smoking; no alcohol. Lives in [place], [place] lives self/kitty.    Social Drivers of Corporate investment banker Strain: Low Risk  (05/15/2023)   Overall Financial Resource Strain (CARDIA)    Difficulty of Paying Living Expenses: Not hard at all  Food Insecurity: No Food Insecurity (10/11/2023)   Hunger Vital Sign    Worried About Running Out of Food in the Last Year: Never true    Ran Out of Food in the Last Year: Never true  Transportation Needs: No Transportation Needs (10/11/2023)   PRAPARE - Administrator, Civil Service (Medical): No    Lack of Transportation (Non-Medical): No  Physical Activity: Inactive (05/15/2023)   Exercise Vital Sign    Days of Exercise per Week: 0 days    Minutes of Exercise per Session: 0 min  Stress: No Stress Concern Present (05/15/2023)   [employer] of Occupational Health - Occupational Stress Questionnaire    Feeling of Stress : Not at all  Social Connections: Moderately Integrated (09/12/2023)   Social Connection and Isolation Panel    Frequency of Communication with Friends and Family: More than three times a week    Frequency of Social Gatherings with Friends and Family: More than three times a week    Attends Religious Services: More than 4 times per year    Active Member of [employer] or Organizations: Yes    Attends Banker Meetings: More than 4 times per year    Marital Status: Never married  Intimate Partner Violence: Not At Risk (10/11/2023)   Humiliation, Afraid, Rape, and Kick questionnaire    Fear of Current or Ex-Partner: No    Emotionally Abused: No    Physically Abused: No     Sexually Abused: No    FAMILY HISTORY: Family History  Problem Relation Age of Onset   Diabetes Mother    Hyperlipidemia Mother    Heart disease Mother    Prostate cancer Neg Hx    Kidney cancer Neg Hx    Bladder Cancer Neg Hx     ALLERGIES:  has no known allergies.  MEDICATIONS:  Current Outpatient Medications  Medication Sig Dispense Refill   albuterol  (VENTOLIN  HFA) 108 (90 Base) MCG/ACT inhaler Inhale 1-2 puffs into the lungs every 6 (six) hours as needed for wheezing or shortness of breath. 8.5 g 11   amLODipine  (NORVASC ) 5 MG tablet TAKE 1 TABLET BY MOUTH DAILY 100 tablet 2   azithromycin  (ZITHROMAX ) 250 MG tablet Take 1 tablet (250 mg total) by mouth 3 (three) times a week. M-W-F 12 each 3   benzonatate  (TESSALON ) 200 MG capsule Take 1 capsule (200 mg total) by mouth 3 (three) times daily as needed for cough. 60 capsule 2   Calcium  Citrate-Vitamin D  (CALCIUM  CITRATE + D PO) Take 1 tablet by mouth 2 (two) times daily.     citalopram  (CELEXA ) 20 MG tablet TAKE 1 TABLET BY MOUTH DAILY 100 tablet 3   famotidine  (PEPCID ) 20 MG tablet TAKE 1 TABLET BY MOUTH DAILY. BEFORE DINNER 30 tablet  11   ferrous sulfate  324 MG TBEC Take 2 tablets by mouth. 65 mg of elemental iron     fexofenadine (ALLEGRA) 180 MG tablet Take 180 mg by mouth daily.     Fluticasone -Umeclidin-Vilant (TRELEGY ELLIPTA ) 200-62.5-25 MCG/ACT AEPB Inhale 1 puff into the lungs daily. 60 each 6   Fluticasone -Umeclidin-Vilant (TRELEGY ELLIPTA ) 200-62.5-25 MCG/ACT AEPB Inhale 1 puff into the lungs daily. 28 each 0   gabapentin  (NEURONTIN ) 100 MG capsule TAKE 1 CAPSULE BY MOUTH 3 TIMES  DAILY 300 capsule 2   levothyroxine  (SYNTHROID ) 50 MCG tablet TAKE 1 TABLET BY MOUTH DAILY ON  AN EMPTY STOMACH WITH A GLASS OF WATER AT LEAST 30 TO 60 MINUTES  BEFORE BREAKFAST 100 tablet 2   ondansetron  (ZOFRAN ) 8 MG tablet One pill every 8 hours as needed for nausea/vomitting. 40 tablet 1   pantoprazole  (PROTONIX ) 40 MG tablet TAKE 1  TABLET BY MOUTH DAILY 100 tablet 2   rosuvastatin  (CRESTOR ) 10 MG tablet TAKE ONE TABLET BY MOUTH ONCE DAILY 100 tablet 1   solifenacin  (VESICARE ) 5 MG tablet Take 1 tablet (5 mg total) by mouth daily. 30 tablet 2   traZODone  (DESYREL ) 50 MG tablet TAKE 1/2 TO 1 TABLET BY MOUTH AT BEDTIME AS NEEDED FOR SLEEP 90 tablet 3   vitamin B-12 (CYANOCOBALAMIN ) 1000 MCG tablet Take 1,000 mcg by mouth daily. (Patient not taking: Reported on 12/05/2023)     No current facility-administered medications for this visit.      PHYSICAL EXAMINATION:   Vitals:   12/05/23 1317  BP: 116/70  Pulse: 66  Resp: 18  Temp: (!) 97.3 F (36.3 C)  SpO2: 99%     Filed Weights   12/05/23 1317  Weight: 187 lb 11.2 oz (85.1 kg)      Physical Exam Vitals and nursing note reviewed.  HENT:     Head: Normocephalic and atraumatic.     Mouth/Throat:     Pharynx: Oropharynx is clear.  Eyes:     Extraocular Movements: Extraocular movements intact.     Pupils: Pupils are equal, round, and reactive to light.  Cardiovascular:     Rate and Rhythm: Normal rate and regular rhythm.  Pulmonary:     Comments: Decreased breath sounds bilaterally.  Abdominal:     Palpations: Abdomen is soft.  Musculoskeletal:        General: Normal range of motion.     Cervical back: Normal range of motion.  Skin:    General: Skin is warm.  Neurological:     General: No focal deficit present.     Mental Status: She is alert and oriented to person, place, and time.  Psychiatric:        Behavior: Behavior normal.        Judgment: Judgment normal.     LABORATORY DATA:  I have reviewed the data as listed Lab Results  Component Value Date   WBC 4.3 12/05/2023   HGB 9.5 (L) 12/05/2023   HCT 28.2 (L) 12/05/2023   MCV 117.5 (H) 12/05/2023   PLT 112 (L) 12/05/2023   Recent Labs    08/02/23 1309 09/04/23 1640 09/05/23 0539 09/06/23 0242 09/07/23 0821 12/05/23 1318  NA 139 136   < > 138 143 138  K 3.9 3.6   < > 4.2  3.5 3.7  CL 104 101   < > 104 102 105  CO2 27 26   < > 25 30 27   GLUCOSE 126* 128*   < > 183*  112* 100*  BUN 16 14   < > 21 19 15   CREATININE 0.78 1.02*   < > 0.91 0.89 0.76  CALCIUM  9.3 9.2   < > 8.6* 8.8* 9.5  GFRNONAA >60 58*   < > >60 >60 >60  PROT 6.9 7.2  --   --   --  6.7  ALBUMIN 4.1 4.1  --   --   --  3.9  AST 15 17  --   --   --  13*  ALT 16 17  --   --   --  12  ALKPHOS 66 66  --   --   --  56  BILITOT 0.6 0.9  --   --   --  0.5   < > = values in this interval not displayed.     ECHOCARDIOGRAM COMPLETE Result Date: 11/13/2023    ECHOCARDIOGRAM REPORT   Patient Name:   Sarah [name] Date of Exam: 11/13/2023 Medical Rec #:  [id number]       Height:       63.0 in Accession #:    [id number]      Weight:       186.0 lb Date of Birth:  Jul 20, 1950       BSA:          1.875 m Patient Age:    73 years        BP:           120/50 mmHg Patient Gender: F               HR:           76 bpm. Exam Location:  [place] Procedure: 2D Echo, Cardiac Doppler and Color Doppler (Both Spectral and Color            Flow Doppler were utilized during procedure). Indications:    R06.9 DOE  History:        Patient has prior history of Echocardiogram examinations, most                 recent 10/24/2022. COPD, Signs/Symptoms:Shortness of Breath,                 Dyspnea, Chest Pain, Fatigue and Edema; Risk Factors:Sleep                 Apnea, Hypertension and Non-Smoker.  Sonographer:    Doyal Point MHA, BS, RDCS Referring Phys: [phone] CADENCE H FURTH  Sonographer Comments: Suboptimal apical window. Image acquisition challenging due to COPD and Image acquisition challenging due to patient body habitus. IMPRESSIONS  1. Left ventricular ejection fraction, by estimation, is 60 to 65%. The left ventricle has normal function. The left ventricle has no regional wall motion abnormalities. There is mild left ventricular hypertrophy. Left ventricular diastolic parameters were normal.  2. Right ventricular systolic  function is normal. The right ventricular size is normal. Tricuspid regurgitation signal is inadequate for assessing PA pressure.  3. Left atrial size was moderately dilated.  4. The mitral valve is normal in structure. Mild mitral valve regurgitation. No evidence of mitral stenosis.  5. The aortic valve is normal in structure. Aortic valve regurgitation is not visualized. Aortic valve sclerosis is present, with no evidence of aortic valve stenosis.  6. The inferior vena cava is normal in size with greater than 50% respiratory variability, suggesting right atrial pressure of 3 mmHg. FINDINGS  Left Ventricle: Left ventricular ejection fraction, by estimation, is 60 to  65%. The left ventricle has normal function. The left ventricle has no regional wall motion abnormalities. Strain was performed and the global longitudinal strain is indeterminate. The left ventricular internal cavity size was normal in size. There is mild left ventricular hypertrophy. Left ventricular diastolic parameters were normal. Right Ventricle: The right ventricular size is normal. No increase in right ventricular wall thickness. Right ventricular systolic function is normal. Tricuspid regurgitation signal is inadequate for assessing PA pressure. Left Atrium: Left atrial size was moderately dilated. Right Atrium: Right atrial size was normal in size. Pericardium: There is no evidence of pericardial effusion. Mitral Valve: The mitral valve is normal in structure. There is mild calcification of the mitral valve leaflet(s). Mild mitral annular calcification. Mild mitral valve regurgitation. No evidence of mitral valve stenosis. MV peak gradient, 13.8 mmHg. The mean mitral valve gradient is 5.0 mmHg. Tricuspid Valve: The tricuspid valve is normal in structure. Tricuspid valve regurgitation is mild . No evidence of tricuspid stenosis. Aortic Valve: The aortic valve is normal in structure. Aortic valve regurgitation is not visualized. Aortic valve  sclerosis is present, with no evidence of aortic valve stenosis. Aortic valve mean gradient measures 8.0 mmHg. Aortic valve peak gradient measures 14.7 mmHg. Aortic valve area, by VTI measures 2.24 cm. Pulmonic Valve: The pulmonic valve was normal in structure. Pulmonic valve regurgitation is mild. No evidence of pulmonic stenosis. Aorta: The aortic root is normal in size and structure. Venous: The inferior vena cava is normal in size with greater than 50% respiratory variability, suggesting right atrial pressure of 3 mmHg. IAS/Shunts: No atrial level shunt detected by color flow Doppler. Additional Comments: 3D was performed not requiring image post processing on an independent workstation and was indeterminate.  LEFT VENTRICLE PLAX 2D LVIDd:         5.36 cm   Diastology LVIDs:         4.58 cm   LV e' medial:    6.96 cm/s LV PW:         1.18 cm   LV E/e' medial:  23.1 LV IVS:        1.19 cm   LV e' lateral:   8.16 cm/s LVOT diam:     2.00 cm   LV E/e' lateral: 19.7 LV SV:         92 LV SV Index:   49 LVOT Area:     3.14 cm  LEFT ATRIUM              Index LA diam:        4.90 cm  2.61 cm/m LA Vol (A2C):   123.0 ml 65.60 ml/m LA Vol (A4C):   84.0 ml  44.80 ml/m LA Biplane Vol: 102.0 ml 54.40 ml/m  AORTIC VALVE AV Area (Vmax):    2.24 cm AV Area (Vmean):   2.21 cm AV Area (VTI):     2.24 cm AV Vmax:           192.00 cm/s AV Vmean:          130.000 cm/s AV VTI:            0.410 m AV Peak Grad:      14.7 mmHg AV Mean Grad:      8.0 mmHg LVOT Vmax:         137.00 cm/s LVOT Vmean:        91.400 cm/s LVOT VTI:          0.292 m LVOT/AV VTI ratio: 0.71  AORTA Ao Sinus diam: 2.71 cm Ao Asc diam:   2.90 cm MITRAL VALVE MV Area (PHT): 3.12 cm     SHUNTS MV Area VTI:   1.49 cm     Systemic VTI:  0.29 m MV Peak grad:  13.8 mmHg    Systemic Diam: 2.00 cm MV Mean grad:  5.0 mmHg MV Vmax:       1.86 m/s MV Vmean:      106.0 cm/s MV Decel Time: 243 msec MV E velocity: 161.00 cm/s MV A velocity: 125.00 cm/s MV E/A ratio:   1.29 Evalene Lunger MD Electronically signed by Evalene Lunger MD Signature Date/Time: 11/13/2023/5:48:38 PM    Final     MDS (myelodysplastic syndrome), low grade (HCC) #[FEB 2023] Mild macrocytic anemia/thrombocytopenia-hemoglobin around 11-12; platelets 127-140s.  Normal white count. December 2023-bone marrow biopsy -Hypercellular bone marrow for age with dyspoietic changes; no blasts noted. cytogenetics/FISH positive for 5 q. Deletion.    # AUG 2025-hemoglobin 9.5 ; platelets >100;  white count is normal- stable. Currently,  Patient  symptomatic with worsening fatigue-  recommend Revlimid  10 mg 3 on and 1 week off. Discussed the potential side effects of Revlimid  including but not limited to diarrhea skin rash thromboembolic events.  Recommend aspirin baby once a day.  Also discussed the potential teratogenic side effects; and also enrolled in REMs program. Discussed re: pharmacy.  Also called in antiemetics.  # Nonobstructive CAD/ Recent Cardia CTA showed mild nonobstructive CAD [[facility]]- ok with asprin 81mg /day.   # Hx of B12 deficiency- on B12 supplementation-2024 Elevated B12- recommend B12 every other day.   # DISPOSITION:  # Follow up in  6 weeks-- - MD;labs- cbc/cmp-   Dr.B    All questions were answered. The patient knows to call the clinic with any problems, questions or concerns.    Cindy JONELLE Joe, MD 12/05/2023 2:20 PM

## 2023-12-05 NOTE — Telephone Encounter (Signed)
 Clinical Pharmacist Practitioner Encounter   Received new prescription for Revlimid  (lenalidomide ) for the treatment of MDS with 5q deletion, planned duration until disease progression or unacceptable drug toxicity.  CMP from 12/05/23 assessed, no relevant lab abnormalities. Prescription dose and frequency assessed.   Current medication list in Epic reviewed, no DDIs with lenalidomide  identified.   Evaluated chart and no patient barriers to medication adherence identified.   Prescription has been e-scribed to the [facility] for benefits analysis and approval.  Oral Oncology Clinic will continue to follow for insurance authorization, copayment issues, initial counseling and start date.   Sarah [name], PharmD, BCOP, CPP Hematology/Oncology Clinical Pharmacist [facility] Oral Chemotherapy Navigation Clinic [phone]  12/05/2023 2:11 PM

## 2023-12-05 NOTE — Assessment & Plan Note (Addendum)
#[  FEB 2023] Mild macrocytic anemia/thrombocytopenia-hemoglobin around 11-12; platelets 127-140s.  Normal white count. December 2023-bone marrow biopsy -Hypercellular bone marrow for age with dyspoietic changes; no blasts noted. cytogenetics/FISH positive for 5 q. Deletion.    # AUG 2025-hemoglobin 9.5 ; platelets >100;  white count is normal- stable. Currently,  Patient  symptomatic with worsening fatigue-  recommend Revlimid  10 mg 3 on and 1 week off. Discussed the potential side effects of Revlimid  including but not limited to diarrhea skin rash thromboembolic events.  Recommend aspirin baby once a day.  Also discussed the potential teratogenic side effects; and also enrolled in REMs program. Discussed re: pharmacy.  Also called in antiemetics.  # Nonobstructive CAD/ Recent [facility] CTA showed mild nonobstructive CAD [[facility]]- ok with asprin 81mg /day.   # Hx of B12 deficiency- on B12 supplementation-2024 Elevated B12- recommend B12 every other day.   # DISPOSITION:  # Follow up in  6 weeks-- - MD;labs- cbc/cmp-   Dr.B

## 2023-12-05 NOTE — Telephone Encounter (Signed)
 Oral Oncology Patient Advocate Encounter  Prior Authorization for Lenalidomide  has been approved.    PA# [id number]  Effective dates: 12/05/2023 through 04/24/2024  Patients co-pay is $956.67.    Findley Blankenbaker (Patty) Chet Burnet, CPhT  [facility], [place], [facility] Oral Chemotherapy Patient Advocate Specialist III Phone: [phone]  Fax: [phone]

## 2023-12-05 NOTE — Progress Notes (Signed)
 C/o being tired and weak all the time since being in hospital for pneumonia in May. She still has sob/cough. She feels like she sleeps more than she is awake, not having to take the trazodone .  She feels like her neuropathy is getting worse in her feet. Sees pcp.

## 2023-12-05 NOTE — Telephone Encounter (Signed)
 Oral Oncology Patient Advocate Encounter   Received notification that prior authorization for Lenalidomide  is required.   PA submitted on CMM via Latent on 12/05/2023 Key [id number]  Status is pending     Yobani Schertzer (Patty) Chet Burnet, CPhT  [facility], [place], [place] Oral Chemotherapy Patient Advocate Specialist III Phone: [phone]  Fax: [phone]

## 2023-12-06 ENCOUNTER — Other Ambulatory Visit (HOSPITAL_COMMUNITY): Payer: Self-pay

## 2023-12-06 MED ORDER — LENALIDOMIDE 10 MG PO CAPS: 10.0000 mg | ORAL_CAPSULE | Freq: Every day | ORAL | 0 refills | Status: DC

## 2023-12-07 NOTE — Telephone Encounter (Signed)
 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  Treatment goal: Control  Counseled patient on administration, dosing, side effects, monitoring, drug-food interactions, safe handling, storage, and disposal. Patient will take: Lenalidomide : Take 1 capsule (10 mg total) by mouth daily. Take for 21 days, then hold for 7 days. Repeat every 28 days.  Aspirin: Take 81 mg by mouth daily.   Side effects include but not limited to: rash/itchy skin, N/V, fatigue, decreased wbc/hgb/plt, constipation or diarrhea. Rash: patient knows to call the office if rash occurs Diarrhea: patient knows to use loperamide as needed and call the office if they are having four or more loose stool per day    Reviewed with patient importance of keeping a medication schedule and plan for any missed doses.  After discussion with patient no patient barriers to medication adherence identified.   Distress evaluation: Distress thermometer completed during telephone call and reviewed with patient. Due to score, social work referral has not been sent.   Communication and Learning Assessment Primary learner: patient Barriers to learning: No barriers Preferred language: English Learning preferences: Listening Reading   [name] voiced understanding and appreciation. All questions answered. Medication handout provided.  Provided patient with Oral Chemotherapy Navigation Clinic phone number. Patient knows to call the office with questions or concerns. Oral Chemotherapy Navigation Clinic will continue to follow.  Sarah [name], PharmD, BCOP, CPP Hematology/Oncology Clinical Pharmacist [facility] Oral Chemotherapy Navigation Clinic [phone]  12/07/2023 3:31 PM

## 2023-12-08 ENCOUNTER — Ambulatory Visit: Admitting: Pulmonary Disease

## 2023-12-08 ENCOUNTER — Encounter: Payer: Self-pay | Admitting: Pulmonary Disease

## 2023-12-08 VITALS — BP 126/80 | HR 71 | Temp 98.3°F | Ht 63.0 in | Wt 188.8 lb

## 2023-12-08 DIAGNOSIS — D46Z Other myelodysplastic syndromes: Secondary | ICD-10-CM

## 2023-12-08 DIAGNOSIS — J4540 Moderate persistent asthma, uncomplicated: Secondary | ICD-10-CM

## 2023-12-08 DIAGNOSIS — J479 Bronchiectasis, uncomplicated: Secondary | ICD-10-CM

## 2023-12-08 DIAGNOSIS — G4733 Obstructive sleep apnea (adult) (pediatric): Secondary | ICD-10-CM

## 2023-12-08 DIAGNOSIS — R053 Chronic cough: Secondary | ICD-10-CM | POA: Diagnosis not present

## 2023-12-08 MED ORDER — AZITHROMYCIN 250 MG PO TABS: 250.0000 mg | ORAL_TABLET | ORAL | 3 refills | Status: DC

## 2023-12-08 MED ORDER — TRELEGY ELLIPTA 200-62.5-25 MCG/ACT IN AEPB: 1.0000 | INHALATION_SPRAY | Freq: Every day | RESPIRATORY_TRACT | 0 refills | Status: DC

## 2023-12-08 NOTE — Patient Instructions (Addendum)
 VISIT SUMMARY:  Today, we discussed your asthma, bronchiectasis, allergic rhinitis, obstructive sleep apnea, and myelodysplastic syndrome (MDS). We reviewed your current medications and made some adjustments to help manage your symptoms more effectively.  YOUR PLAN:  -ASTHMA AND BRONCHIECTASIS: Asthma is a condition where your airways narrow and swell, producing extra mucus, which can make breathing difficult. Bronchiectasis is a condition where the bronchial tubes of your lungs are permanently damaged, widened, and thickened. We will continue managing these conditions with your inhalers and antibiotics.  Will refill your Azithromycin , which you take on Monday, Wednesday, and Friday. We will also provide samples of Trelegy if available.  -ALLERGIC RHINITIS: Allergic rhinitis is an allergic reaction that causes sneezing, congestion, and a runny nose. It is managed with Allegra 180 mg daily. Your symptoms are exacerbated by environmental allergens like pollen.  -OBSTRUCTIVE SLEEP APNEA: Obstructive sleep apnea is a condition where your breathing repeatedly stops and starts during sleep. It is well-controlled with your CPAP therapy, and you are doing well with compliance.  -MYELODYSPLASTIC SYNDROME (MDS): Myelodysplastic syndrome (MDS) is a group of disorders caused by poorly formed or dysfunctional blood cells. You are starting a new medication, likely Revlimid , which has been approved for zero cost. This medication is expected to help improve your condition, although your blood counts have been slow to improve since your hospitalization in May.  INSTRUCTIONS:  Please ensure your pharmacy provides adequate refills for your antibiotics. We will send a prescription for your new medication for MDS. Continue using your CPAP for sleep apnea and take Allegra daily for allergic rhinitis. If you have any new or worsening symptoms, please contact our office.

## 2023-12-08 NOTE — Progress Notes (Signed)
 Subjective:    Patient ID: Sarah [name], female    DOB: [birth year], 73 y.o.   MRN: [id number]  Patient Care Team: [name], MD as PCP - General (Internal Medicine) [name], MD as PCP - Cardiology (Cardiology) [name], MD (Internal Medicine) [name] [name], MD (General Surgery) [name], MD as Consulting Physician (Oncology)  Chief Complaint  Patient presents with   Follow-up    Cough, shortness of breath on exertion and wheezing.     BACKGROUND/INTERVAL:Patient is a 73 year old lifelong never smoker with a history of moderate persistent asthma who presents today as a scheduled visit.  I last saw the patient on 26 September 2023.  She has been maintained on Trelegy Ellipta  200, 1 inhalation daily for her asthma.  She is also on rescue albuterol .  She is on CPAP for obstructive sleep apnea.    HPI Discussed the use of AI scribe software for clinical note transcription with the patient, who gave verbal consent to proceed.  History of Present Illness   Sarah [name] is a 73 year old female with asthma, bronchiectasis, and sleep apnea who presents for follow-up.  She has been experiencing exacerbations of her asthma symptoms, particularly due to high pollen levels. She uses an inhaler regularly but noted increased difficulty yesterday due to the pollen. She also takes Allegra 180 mg daily for allergies.  She continues to experience a cough, sometimes producing yellow sputum, which she attributes to environmental factors like pollen. She is on an antibiotic regimen taken on Monday, Wednesday, and Friday to manage infections related to her bronchiectasis.  She reports using her CPAP for sleep apnea and feels it is beneficial.  She discusses her myelodysplastic syndrome (MDS) and mentions a recent hospital stay in May. She is starting a new medication, Revlimid , which she initially could not afford due to a high copay, but it has since been approved  for zero cost. She notes that her condition has not improved as quickly as she hoped since her hospital discharge.     Her compliance download shows 100% compliance with her CPAP.  She uses an AirSense 10 AutoSet at 5 to 15 cm H2O.  She averages 7 hours 53 minutes of use per night.  Usually AHI is 4.4.  Pressures vary between 9 to 13 cm H2O.  Leaks are fairly well-controlled.   Review of Systems A 10 point review of systems was performed and it is as noted above otherwise negative.   Patient Active Problem List   Diagnosis Date Noted   Bronchiectasis (HCC) 11/07/2023   History of acute respiratory failure 09/04/2023   Right middle lobe pneumonia 09/04/2023   History of cystitis 05/07/2023   Coronary artery disease due to calcified coronary lesion 05/05/2023   Persistent asthma with acute exacerbation 05/05/2023   Bilateral lower extremity edema 12/02/2022   OSA (obstructive sleep apnea) 09/06/2022   Hyperglycemia 08/15/2022   Snoring 06/03/2022   Hypersomnia 05/18/2022   Suprapubic cramping 05/18/2022   Overactive bladder 05/18/2022   MDS (myelodysplastic syndrome), low grade (HCC) 04/20/2022   Tubular adenoma of colon 02/07/2022   Cervical myelopathy (HCC) 11/10/2021   Spinal stenosis, cervical region 09/15/2021   Adenomatous polyp of transverse colon    Polyp of colon    Thrombocytopenia (HCC) 01/13/2021   Essential hypertension 12/20/2020   Macrocytic anemia 12/18/2020   Allergic rhinitis 12/13/2020   Fatigue 12/13/2020   Insomnia 12/13/2020   Grief reaction 08/09/2020   Cognitive  complaints 08/09/2020   Neuropathy 06/14/2020   Educated about COVID-19 virus infection 04/10/2019   Prediabetes 10/13/2018   LVH (left ventricular hypertrophy) due to hypertensive disease, without heart failure 10/13/2018   Hospital discharge follow-up 07/08/2018   Hypothyroid 06/15/2017   Post herpetic neuralgia 06/06/2017   Encounter for preventive health examination 09/09/2014   Chronic  GERD 09/09/2014   Hyperlipidemia 08/17/2014   S/P hysterectomy 04/28/2013   Initial Medicare annual wellness visit 04/28/2013   Depression     Social History   Tobacco Use   Smoking status: Never   Smokeless tobacco: Never  Substance Use Topics   Alcohol use: No    No Known Allergies  Current Meds  Medication Sig   albuterol  (VENTOLIN  HFA) 108 (90 Base) MCG/ACT inhaler Inhale 1-2 puffs into the lungs every 6 (six) hours as needed for wheezing or shortness of breath.   amLODipine  (NORVASC ) 5 MG tablet TAKE 1 TABLET BY MOUTH DAILY   benzonatate  (TESSALON ) 200 MG capsule Take 1 capsule (200 mg total) by mouth 3 (three) times daily as needed for cough.   Calcium  Citrate-Vitamin D  (CALCIUM  CITRATE + D PO) Take 1 tablet by mouth 2 (two) times daily.   citalopram  (CELEXA ) 20 MG tablet TAKE 1 TABLET BY MOUTH DAILY   famotidine  (PEPCID ) 20 MG tablet TAKE 1 TABLET BY MOUTH DAILY. BEFORE DINNER   ferrous sulfate  324 MG TBEC Take 2 tablets by mouth. 65 mg of elemental iron   fexofenadine (ALLEGRA) 180 MG tablet Take 180 mg by mouth daily.   Fluticasone -Umeclidin-Vilant (TRELEGY ELLIPTA ) 200-62.5-25 MCG/ACT AEPB Inhale 1 puff into the lungs daily.   Fluticasone -Umeclidin-Vilant (TRELEGY ELLIPTA ) 200-62.5-25 MCG/ACT AEPB Inhale 1 puff into the lungs daily.   Fluticasone -Umeclidin-Vilant (TRELEGY ELLIPTA ) 200-62.5-25 MCG/ACT AEPB Inhale 1 puff into the lungs daily in the afternoon.   gabapentin  (NEURONTIN ) 100 MG capsule TAKE 1 CAPSULE BY MOUTH 3 TIMES  DAILY   levothyroxine  (SYNTHROID ) 50 MCG tablet TAKE 1 TABLET BY MOUTH DAILY ON  AN EMPTY STOMACH WITH A GLASS OF WATER AT LEAST 30 TO 60 MINUTES  BEFORE BREAKFAST   pantoprazole  (PROTONIX ) 40 MG tablet TAKE 1 TABLET BY MOUTH DAILY   rosuvastatin  (CRESTOR ) 10 MG tablet TAKE ONE TABLET BY MOUTH ONCE DAILY   solifenacin  (VESICARE ) 5 MG tablet Take 1 tablet (5 mg total) by mouth daily.   traZODone  (DESYREL ) 50 MG tablet TAKE 1/2 TO 1 TABLET BY MOUTH  AT BEDTIME AS NEEDED FOR SLEEP   vitamin B-12 (CYANOCOBALAMIN ) 1000 MCG tablet Take 1,000 mcg by mouth daily.   [DISCONTINUED] azithromycin  (ZITHROMAX ) 250 MG tablet Take 1 tablet (250 mg total) by mouth 3 (three) times a week. M-W-F    Immunization History  Administered Date(s) Administered    sv, Bivalent, Protein Subunit Rsvpref,pf (Abrysvo) 05/26/2022   Fluad Quad(high Dose 65+) 12/06/2018   Fluad Trivalent(High Dose 65+) 01/16/2023   INFLUENZA, HIGH DOSE SEASONAL PF 04/22/2016, 06/05/2017   Influenza,inj,Quad PF,6+ Mos 04/26/2013, 01/30/2015   Influenza-Unspecified 01/24/2018, 12/06/2018, 01/02/2020, 04/02/2021, 02/03/2022   Moderna Covid-19 Fall Seasonal Vaccine 17yrs & older 02/10/2023   Moderna Covid-19 Vaccine Bivalent Booster 2yrs & up 02/05/2021, 02/03/2022   Moderna Sars-Covid-2 Vaccination 06/20/2019, 07/22/2019, 02/14/2020   PNEUMOCOCCAL CONJUGATE-20 11/09/2020   Pneumococcal Conjugate-13 09/01/2014, 01/24/2018   Pneumococcal Polysaccharide-23 04/26/2013, 02/28/2019   Tdap 12/21/2010, 06/18/2014, 05/26/2022   Zoster Recombinant(Shingrix) 07/08/2017, 09/22/2017        Objective:     BP 126/80 (BP Location: Left Arm, Patient Position: Sitting, Cuff Size:  Normal)   Pulse 71   Temp 98.3 F (36.8 C) (Oral)   Ht 5' 3 (1.6 m)   Wt 188 lb 12.8 oz (85.6 kg)   SpO2 95%   BMI 33.44 kg/m   SpO2: 95 %  GENERAL: Overweight woman, no acute distress.  Fully ambulatory, no conversational dyspnea, no pallor noted today. HEAD: Normocephalic, atraumatic.  EYES: Pupils equal, round, reactive to light.  No scleral icterus.  Pale conjunctiva. MOUTH: Natural dentition present, few chipped teeth.  Oral mucosa moist. NECK: Supple. No thyromegaly. Trachea midline. No JVD.  No adenopathy. PULMONARY: Lungs sound coarse but otherwise clear to auscultation bilaterally. CARDIOVASCULAR: S1 and S2. Regular rate and rhythm.  No rubs, murmurs gallops heard. GASTROINTESTINAL:  Benign. MUSCULOSKELETAL: No joint deformity, no clubbing, no edema.  NEUROLOGIC: No overt focal deficits. SKIN: Intact,warm,dry. PSYCH: Mood and behavior appropriate.       Assessment & Plan:     ICD-10-CM   1. Moderate persistent asthma without complication  J45.40     2. Bronchiectasis without complication (HCC)  J47.9     3. MDS (myelodysplastic syndrome), low grade (HCC)  D46.Z     4. Chronic cough  R05.3     5. OSA (obstructive sleep apnea)  G47.33      Meds ordered this encounter  Medications   Fluticasone -Umeclidin-Vilant (TRELEGY ELLIPTA ) 200-62.5-25 MCG/ACT AEPB    Sig: Inhale 1 puff into the lungs daily in the afternoon.    Dispense:  2 each    Refill:  0    Lot Number?:   [id number]    Expiration Date?:   05/26/2025    Manufacturer?:   GlaxoSmithKline [[id number]]    NDC:   9826-9106-38 [332750]    Quantity:   2   azithromycin  (ZITHROMAX ) 250 MG tablet    Sig: Take 1 tablet (250 mg total) by mouth 3 (three) times a week. M-W-F    Dispense:  12 each    Refill:  3   Discussion:    Asthma and bronchiectasis Asthma and bronchiectasis are managed with inhalers and antibiotics. Exacerbations occur due to environmental factors like pollen. Persistent cough with yellow sputum is likely due to bronchiectasis. Antibiotic regimen helps prevent infections. - Ensure pharmacy provides adequate refills for the antibiotic regimen (Azithromycin , Monday, Wednesday, Friday dosing) to prevent infections related to bronchiectasis. - Provide samples of Trelegy if available.  Allergic rhinitis Allergic rhinitis is managed with Allegra 180 mg. Symptoms are exacerbated by environmental allergens such as pollen.  Obstructive sleep apnea Obstructive sleep apnea is well-controlled with CPAP therapy. Compliance with CPAP is good, and control of symptoms is satisfactory.  Myelodysplastic syndrome (MDS) This issue has complexing to her management.  She is starting a new medication, likely Revlimid ,  for myelodysplastic syndrome. Initially faced a high copay but was approved for a relief program, reducing the cost to zero. The medication is expected to help improve her condition, although her blood counts have been slow to improve since hospitalization in May. - She will have Revlimid  managed by hematology/oncology.  - Will need to monitor for potential pulmonary side effects of that medication. - Potential side effects can include ILD, hypersensitivity pneumonitis and pulmonary hemorrhage.    Will see the patient in follow-up in 3 months time call sooner should any new problems arise.  Advised if symptoms do not improve or worsen, to please contact office for sooner follow up or seek emergency care.    I spent 30 minutes of dedicated to the  care of this patient on the date of this encounter to include pre-visit review of records, face-to-face time with the patient discussing conditions above, post visit ordering of testing, clinical documentation with the electronic health record, making appropriate referrals as documented, and communicating necessary findings to members of the patients care team.     C. Leita Sanders, MD Advanced Bronchoscopy PCCM [facility]    *This note was generated using voice recognition software/Dragon and/or AI transcription program.  Despite best efforts to proofread, errors can occur which can change the meaning. Any transcriptional errors that result from this process are unintentional and may not be fully corrected at the time of dictation.

## 2023-12-11 ENCOUNTER — Telehealth: Payer: Self-pay | Admitting: Pharmacy Technician

## 2023-12-11 ENCOUNTER — Telehealth: Payer: Self-pay | Admitting: *Deleted

## 2023-12-11 NOTE — Telephone Encounter (Signed)
 Oral Oncology Patient Advocate Encounter  I contacted [employer] to get medication status.  Per [employer] representative medication is scheduled to be delivered tomorrow, 12/12/2023.   Patient knows to start on 12/15/2023.  Tula Schryver (Patty) Chet Burnet, CPhT  [facility], [place], [facility] Oral Chemotherapy Patient Advocate Specialist III Phone: [phone]  Fax: [phone]

## 2023-12-13 NOTE — Telephone Encounter (Signed)
 Oral Oncology Patient Advocate Encounter  I attempted to contact patient today to ask if she wanted a calendar for her medication.  No answer. LVM to call me back.  Dalante Minus (Patty) Chet Burnet, CPhT  [facility], [place], [facility] Oral Chemotherapy Patient Advocate Specialist III Phone: [phone]  Fax: [phone]

## 2023-12-24 ENCOUNTER — Other Ambulatory Visit: Payer: Self-pay | Admitting: Internal Medicine

## 2024-01-02 ENCOUNTER — Other Ambulatory Visit: Payer: Self-pay

## 2024-01-02 ENCOUNTER — Telehealth: Payer: Self-pay | Admitting: *Deleted

## 2024-01-02 DIAGNOSIS — D46Z Other myelodysplastic syndromes: Secondary | ICD-10-CM

## 2024-01-02 MED ORDER — LENALIDOMIDE 10 MG PO CAPS: 10.0000 mg | ORAL_CAPSULE | Freq: Every day | ORAL | 0 refills | Status: DC

## 2024-01-02 NOTE — Telephone Encounter (Signed)
 Patient called to report that she needs a refill for her Revlimid .

## 2024-01-09 ENCOUNTER — Emergency Department
Admission: EM | Admit: 2024-01-09 | Discharge: 2024-01-09 | Disposition: A | Discharge status: Home / Self Care | Source: Ambulatory Visit | Attending: Emergency Medicine | Admitting: Emergency Medicine

## 2024-01-09 ENCOUNTER — Encounter: Payer: Self-pay | Admitting: Intensive Care

## 2024-01-09 ENCOUNTER — Emergency Department

## 2024-01-09 ENCOUNTER — Other Ambulatory Visit: Payer: Self-pay

## 2024-01-09 ENCOUNTER — Ambulatory Visit: Payer: Self-pay

## 2024-01-09 DIAGNOSIS — J45909 Unspecified asthma, uncomplicated: Secondary | ICD-10-CM | POA: Insufficient documentation

## 2024-01-09 DIAGNOSIS — J690 Pneumonitis due to inhalation of food and vomit: Secondary | ICD-10-CM | POA: Diagnosis not present

## 2024-01-09 DIAGNOSIS — E871 Hypo-osmolality and hyponatremia: Secondary | ICD-10-CM | POA: Diagnosis not present

## 2024-01-09 DIAGNOSIS — J470 Bronchiectasis with acute lower respiratory infection: Secondary | ICD-10-CM | POA: Diagnosis not present

## 2024-01-09 DIAGNOSIS — R109 Unspecified abdominal pain: Secondary | ICD-10-CM | POA: Diagnosis not present

## 2024-01-09 DIAGNOSIS — K219 Gastro-esophageal reflux disease without esophagitis: Secondary | ICD-10-CM | POA: Diagnosis not present

## 2024-01-09 DIAGNOSIS — K6289 Other specified diseases of anus and rectum: Secondary | ICD-10-CM | POA: Diagnosis not present

## 2024-01-09 DIAGNOSIS — E039 Hypothyroidism, unspecified: Secondary | ICD-10-CM | POA: Insufficient documentation

## 2024-01-09 DIAGNOSIS — R1030 Lower abdominal pain, unspecified: Secondary | ICD-10-CM | POA: Diagnosis not present

## 2024-01-09 DIAGNOSIS — K529 Noninfective gastroenteritis and colitis, unspecified: Secondary | ICD-10-CM | POA: Insufficient documentation

## 2024-01-09 DIAGNOSIS — K7689 Other specified diseases of liver: Secondary | ICD-10-CM | POA: Diagnosis not present

## 2024-01-09 DIAGNOSIS — E785 Hyperlipidemia, unspecified: Secondary | ICD-10-CM | POA: Diagnosis not present

## 2024-01-09 DIAGNOSIS — R042 Hemoptysis: Secondary | ICD-10-CM | POA: Diagnosis not present

## 2024-01-09 DIAGNOSIS — E876 Hypokalemia: Secondary | ICD-10-CM | POA: Diagnosis not present

## 2024-01-09 DIAGNOSIS — R0602 Shortness of breath: Secondary | ICD-10-CM | POA: Diagnosis not present

## 2024-01-09 DIAGNOSIS — D62 Acute posthemorrhagic anemia: Secondary | ICD-10-CM | POA: Diagnosis not present

## 2024-01-09 DIAGNOSIS — J9602 Acute respiratory failure with hypercapnia: Secondary | ICD-10-CM | POA: Diagnosis not present

## 2024-01-09 DIAGNOSIS — J9601 Acute respiratory failure with hypoxia: Secondary | ICD-10-CM | POA: Diagnosis not present

## 2024-01-09 DIAGNOSIS — C9210 Chronic myeloid leukemia, BCR/ABL-positive, not having achieved remission: Secondary | ICD-10-CM | POA: Diagnosis not present

## 2024-01-09 DIAGNOSIS — D509 Iron deficiency anemia, unspecified: Secondary | ICD-10-CM | POA: Diagnosis not present

## 2024-01-09 DIAGNOSIS — I700 Atherosclerosis of aorta: Secondary | ICD-10-CM | POA: Diagnosis not present

## 2024-01-09 DIAGNOSIS — I10 Essential (primary) hypertension: Secondary | ICD-10-CM | POA: Insufficient documentation

## 2024-01-09 DIAGNOSIS — K317 Polyp of stomach and duodenum: Secondary | ICD-10-CM | POA: Diagnosis not present

## 2024-01-09 DIAGNOSIS — I517 Cardiomegaly: Secondary | ICD-10-CM | POA: Diagnosis not present

## 2024-01-09 DIAGNOSIS — R1032 Left lower quadrant pain: Secondary | ICD-10-CM | POA: Diagnosis not present

## 2024-01-09 DIAGNOSIS — D6959 Other secondary thrombocytopenia: Secondary | ICD-10-CM | POA: Diagnosis not present

## 2024-01-09 DIAGNOSIS — D539 Nutritional anemia, unspecified: Secondary | ICD-10-CM | POA: Diagnosis not present

## 2024-01-09 DIAGNOSIS — J441 Chronic obstructive pulmonary disease with (acute) exacerbation: Secondary | ICD-10-CM | POA: Diagnosis not present

## 2024-01-09 DIAGNOSIS — N179 Acute kidney failure, unspecified: Secondary | ICD-10-CM | POA: Diagnosis not present

## 2024-01-09 LAB — GASTROINTESTINAL PANEL BY PCR, STOOL (REPLACES STOOL CULTURE)

## 2024-01-09 LAB — CBC
HCT: 28.5 % — ABNORMAL LOW (ref 36.0–46.0)
Hemoglobin: 9.2 g/dL — ABNORMAL LOW (ref 12.0–15.0)
MCH: 37.6 pg — ABNORMAL HIGH (ref 26.0–34.0)
MCHC: 32.3 g/dL (ref 30.0–36.0)
MCV: 116.3 fL — ABNORMAL HIGH (ref 80.0–100.0)
Platelets: 103 K/uL — ABNORMAL LOW (ref 150–400)
RBC: 2.45 MIL/uL — ABNORMAL LOW (ref 3.87–5.11)
RDW: 13.8 % (ref 11.5–15.5)
WBC: 5.1 K/uL (ref 4.0–10.5)
nRBC: 0 % (ref 0.0–0.2)

## 2024-01-09 LAB — URINALYSIS, ROUTINE W REFLEX MICROSCOPIC
Bilirubin Urine: NEGATIVE
Glucose, UA: NEGATIVE mg/dL
Hgb urine dipstick: NEGATIVE
Ketones, ur: 5 mg/dL — AB
Leukocytes,Ua: NEGATIVE
Nitrite: NEGATIVE
Protein, ur: NEGATIVE mg/dL
Specific Gravity, Urine: 1.01 (ref 1.005–1.030)
pH: 6 (ref 5.0–8.0)

## 2024-01-09 LAB — COMPREHENSIVE METABOLIC PANEL WITH GFR
ALT: 15 U/L (ref 0–44)
AST: 10 U/L — ABNORMAL LOW (ref 15–41)
Albumin: 4 g/dL (ref 3.5–5.0)
Alkaline Phosphatase: 61 U/L (ref 38–126)
Anion gap: 10 (ref 5–15)
BUN: 10 mg/dL (ref 8–23)
CO2: 23 mmol/L (ref 22–32)
Calcium: 9.2 mg/dL (ref 8.9–10.3)
Chloride: 107 mmol/L (ref 98–111)
Creatinine, Ser: 0.84 mg/dL (ref 0.44–1.00)
GFR, Estimated: >60 mL/min (ref >60)
Glucose, Bld: 98 mg/dL (ref 70–99)
Potassium: 3.8 mmol/L (ref 3.5–5.1)
Sodium: 140 mmol/L (ref 135–145)
Total Bilirubin: 1.3 mg/dL — ABNORMAL HIGH (ref 0.0–1.2)
Total Protein: 7.2 g/dL (ref 6.5–8.1)

## 2024-01-09 LAB — C DIFFICILE QUICK SCREEN W PCR REFLEX
C Diff antigen: NEGATIVE
C Diff interpretation: NOT DETECTED
C Diff toxin: NEGATIVE

## 2024-01-09 LAB — LIPASE, BLOOD: Lipase: 22 U/L (ref 11–51)

## 2024-01-09 MED ORDER — ONDANSETRON HCL 4 MG/2ML IJ SOLN
4.0000 mg | Freq: Once | INTRAMUSCULAR | Status: AC
  Administered 2024-01-09: 4 mg via INTRAVENOUS
  Filled 2024-01-09: qty 2

## 2024-01-09 MED ORDER — AMOXICILLIN-POT CLAVULANATE 875-125 MG PO TABS: 1.0000 | ORAL_TABLET | Freq: Two times a day (BID) | ORAL | 0 refills | Status: DC

## 2024-01-09 MED ORDER — METRONIDAZOLE 500 MG/100ML IV SOLN
500.0000 mg | Freq: Once | INTRAVENOUS | Status: AC
  Administered 2024-01-09: 500 mg via INTRAVENOUS
  Filled 2024-01-09: qty 100

## 2024-01-09 MED ORDER — SODIUM CHLORIDE 0.9 % IV SOLN
2.0000 g | Freq: Once | INTRAVENOUS | Status: AC
  Administered 2024-01-09: 2 g via INTRAVENOUS
  Filled 2024-01-09: qty 20

## 2024-01-09 MED ORDER — IOHEXOL 300 MG/ML  SOLN
100.0000 mL | Freq: Once | INTRAMUSCULAR | Status: AC | PRN
  Administered 2024-01-09: 100 mL via INTRAVENOUS

## 2024-01-09 MED ORDER — MORPHINE SULFATE (PF) 4 MG/ML IV SOLN
4.0000 mg | Freq: Once | INTRAVENOUS | Status: AC
  Administered 2024-01-09: 4 mg via INTRAVENOUS
  Filled 2024-01-09: qty 1

## 2024-01-09 NOTE — ED Notes (Signed)
 Patient transported to CT

## 2024-01-09 NOTE — Telephone Encounter (Signed)
 FYI Only or Action Required?: FYI only for provider.  Patient was last seen in primary care on 11/06/2023 by [name], MD.  Called Nurse Triage reporting Abdominal Pain.  Symptoms began several days ago.  Interventions attempted: Nothing.  Symptoms are: rapidly worsening.  Triage Disposition: Go to ED Now (Notify PCP)  Patient/caregiver understands and will follow disposition?: Yes       Copied from CRM [id number]. Topic: Clinical - Red Word Triage >> Jan 09, 2024  2:17 PM Rosina BIRCH wrote: Red Word that prompted transfer to Nurse Triage: pain in lower left stomach in the crease since Saturday and it has gotten worse Reason for Disposition  [1] SEVERE pain AND [2] age > 60 years  Answer Assessment - Initial Assessment Questions 1. LOCATION: Where does it hurt?      Lower left 2. RADIATION: Does the pain shoot anywhere else? (e.g., chest, back)     *No Answer* 3. ONSET: When did the pain begin? (e.g., minutes, hours or days ago)      Saturday, worse today 4. SUDDEN: Gradual or sudden onset?     constant 5. PATTERN Does the pain come and go, or is it constant?     *No Answer* 6. SEVERITY: How bad is the pain?  (e.g., Scale 1-10; mild, moderate, or severe)     It's been doubling me over [facility] appreciates pt grunting in pain over the phone. 7. RECURRENT SYMPTOM: Have you ever had this type of stomach pain before? If Yes, ask: When was the last time? and What happened that time?      *No Answer* 8. CAUSE: What do you think is causing the stomach pain? (e.g., gallstones, recent abdominal surgery)     *No Answer* 9. RELIEVING/AGGRAVATING FACTORS: What makes it better or worse? (e.g., antacids, bending or twisting motion, bowel movement)     *No Answer* 10. OTHER SYMPTOMS: Do you have any other symptoms? (e.g., back pain, diarrhea, fever, urination pain, vomiting)       *No Answer* 11. PREGNANCY: Is there any chance you are pregnant? When was  your last menstrual period?       *No Answer*  Protocols used: Abdominal Pain - Female-A-AH

## 2024-01-09 NOTE — ED Triage Notes (Signed)
 Brought from [facility]. LLQ pain. Reports some constipation. White mucous in stool.   KC vitals: 144/62 b/p 79HR 96% RA 98.3 oral

## 2024-01-09 NOTE — ED Provider Notes (Signed)
 [facility] Emergency Department Provider Note     Event Date/Time   First MD Initiated Contact with Patient 01/09/24 1654     (approximate)   History   Abdominal Pain   HPI  Sarah [name] is a 73 y.o. 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  She presented there endorsing left lower quadrant abdominal pain as well as some constipation.  She would also describe her stools as containing white mucus.  No reports of any fevers, nausea, vomiting, or chest pain at this time.  Vital signs are stable by report from [facility].  She denies any history of diverticulitis colitis, or IBS.   Physical Exam   Triage Vital Signs: ED Triage Vitals [01/09/24 1553]  Encounter Vitals Group     BP      Girls Systolic BP Percentile      Girls Diastolic BP Percentile      Boys Systolic BP Percentile      Boys Diastolic BP Percentile      Pulse      Resp      Temp      Temp src      SpO2      Weight 190 lb (86.2 kg)     Height 5' 3 (1.6 m)     Head Circumference      Peak Flow      Pain Score 10     Pain Loc      Pain Education      Exclude from Growth Chart     Most recent vital signs: Vitals:   01/09/24 2130 01/09/24 2200  BP: (!) 118/54 (!) 122/58  Pulse: 79 70  Resp: 18 18  Temp: 97.8 F (36.6 C) 97.9 F (36.6 C)  SpO2: 90% 94%    General Awake, no distress. NAD HEENT NCAT. PERRL. EOMI. No rhinorrhea. Mucous membranes are moist.  CV:  Good peripheral perfusion. RRR RESP:  Normal effort. CTA ABD:  No distention.  Ender palpation to the left lower quadrant.  No rebound, guarding, or rigidity noted.   ED Results / Procedures / Treatments   Labs (all labs ordered are listed, but only abnormal results are displayed) Labs Reviewed  COMPREHENSIVE METABOLIC PANEL WITH GFR - Abnormal; Notable for the following components:       Result Value   AST 10 (*)    Total Bilirubin 1.3 (*)    All other components within normal limits  CBC - Abnormal; Notable for the following components:   RBC 2.45 (*)    Hemoglobin 9.2 (*)    HCT 28.5 (*)    MCV 116.3 (*)    MCH 37.6 (*)    Platelets 103 (*)    All other components within normal limits  URINALYSIS, ROUTINE W REFLEX MICROSCOPIC - Abnormal; Notable for the following components:   Color, Urine YELLOW (*)    APPearance CLEAR (*)    Ketones, ur 5 (*)    All other components within normal limits  GASTROINTESTINAL PANEL BY PCR, STOOL (REPLACES STOOL CULTURE)  C DIFFICILE QUICK SCREEN W PCR REFLEX    LIPASE, BLOOD     EKG   RADIOLOGY  I personally viewed and evaluated these images as part of my medical decision making, as well as reviewing the written report by the radiologist.  ED Provider Interpretation: Proctocolitis involving the rectum and sigmoid  colon  CT ABDOMEN PELVIS W CONTRAST Result Date: 01/09/2024 CLINICAL DATA:  Left lower quadrant pain. EXAM: CT ABDOMEN AND PELVIS WITH CONTRAST TECHNIQUE: Multidetector CT imaging of the abdomen and pelvis was performed using the standard protocol following bolus administration of intravenous contrast. RADIATION DOSE REDUCTION: This exam was performed according to the departmental dose-optimization program which includes automated exposure control, adjustment of the mA and/or kV according to patient size and/or use of iterative reconstruction technique. CONTRAST:  OMNIPAQUE  IOHEXOL  300 MG/ML  SOLN COMPARISON:  09/04/2023 FINDINGS: Lower Chest: No acute findings. Hepatobiliary: Stable small cyst in left hepatic lobe. No suspicious hepatic masses identified. Gallbladder is unremarkable. No evidence of biliary ductal dilatation. Pancreas:  No mass or inflammatory changes. Spleen: Within normal limits in size and appearance. Adrenals/Urinary Tract: No suspicious masses identified. No evidence of ureteral calculi or  hydronephrosis. Unremarkable unopacified urinary bladder. Stomach/Bowel: Mild wall thickening is seen involving the rectosigmoid colon with adjacent soft tissue stranding, which is new since previous study. This is consistent with mild proctocolitis, although no definite diverticular disease is seen. No No evidence of perforation or abscess. Vascular/Lymphatic: No pathologically enlarged lymph nodes. No acute vascular findings. Reproductive:  No mass or other significant abnormality. Other:  None. Musculoskeletal:  No suspicious bone lesions identified. IMPRESSION: Mild proctocolitis involving the rectum and distal sigmoid colon. No evidence of abscess or other complication. Electronically Signed   By: Norleen DELENA Kil M.D.   On: 01/09/2024 18:32     PROCEDURES:  Critical Care performed: No  Procedures   MEDICATIONS ORDERED IN ED: Medications  ondansetron  (ZOFRAN ) injection 4 mg (4 mg Intravenous Given 01/09/24 1759)  morphine  (PF) 4 MG/ML injection 4 mg (4 mg Intravenous Given 01/09/24 1759)  iohexol  (OMNIPAQUE ) 300 MG/ML solution 100 mL (100 mLs Intravenous Contrast Given 01/09/24 1814)  metroNIDAZOLE  (FLAGYL ) IVPB 500 mg (0 mg Intravenous Stopped 01/09/24 2205)  cefTRIAXone  (ROCEPHIN ) 2 g in sodium chloride  0.9 % 100 mL IVPB (0 g Intravenous Stopped 01/09/24 2128)     IMPRESSION / MDM / ASSESSMENT AND PLAN / ED COURSE  I reviewed the triage vital signs and the nursing notes.                              Differential diagnosis includes, but is not limited to, ovarian cyst, ovarian torsion, acute appendicitis, diverticulitis, urinary tract infection/pyelonephritis, endometriosis, bowel obstruction, colitis, renal colic, gastroenteritis, hernia,  etc.  Patient's presentation is most consistent with acute complicated illness / injury requiring diagnostic workup.  Patient's diagnosis is consistent with proctocolitis.  Patient with reassuring Szymon workup including reassuring labs.  No acute  leukocytosis is noted.  Patient stable anemia is again demonstrated.  No UA evidence of bacteriuria.  Stool PCR shows no C. difficile or other infectious etiology.  CT images interpreted by me, do however, demonstrate a proctosigmoid colitis, without evidence of diverticulitis or abscess.  Patient treated in the ED with IV fluid bolus as well as empiric doses of metronidazole  and Rocephin .  Pain is currently controlled with IV morphine  and Zofran .  We discussed the option for admission for ongoing fluid hydration as well as IV antibiotics, but patient has declined admission at this time.  She reports she feels stable for outpatient management. Patient will be discharged home with prescriptions for Augmentin . Patient is to follow up with her PCP or specialist as discussed, as needed or otherwise directed. Patient is given ED precautions to return  to the ED for any worsening or new symptoms.   FINAL CLINICAL IMPRESSION(S) / ED DIAGNOSES   Final diagnoses:  Proctocolitis     Rx / DC Orders   ED Discharge Orders          Ordered    amoxicillin -clavulanate (AUGMENTIN ) 875-125 MG tablet  2 times daily        01/09/24 2104             Note:  This document was prepared using Dragon voice recognition software and may include unintentional dictation errors.    Loyd Candida LULLA Aldona, PA-C 01/09/24 2351    Waymond Lorelle Cummins, MD 01/11/24 [phone]

## 2024-01-09 NOTE — Discharge Instructions (Addendum)
 Your exam and labs overall reassuring at this time.  Your CT scan does show evidence of inflammation to your distal colon and rectum.  You are being treated with antibiotic for this proctocolitis.  No evidence of acute diverticulitis or diverticulosis on your CT scan.  Continue to hydrate and eat small carb rich meals to help reduce symptoms.  Follow-up with your primary provider or return to the ED for worsening symptoms as discussed.

## 2024-01-09 NOTE — ED Notes (Signed)
Dc instructions and scripts reviewed with pt no questions or concerns at this time.  

## 2024-01-11 ENCOUNTER — Telehealth: Payer: Self-pay | Admitting: *Deleted

## 2024-01-11 NOTE — Telephone Encounter (Signed)
 The patient called today that she took the revlimid  pills an she was not doing good. She went to ER 9/16 and she was told that she has colitis. She tried to eat rice yest. She vomited the rice right back. Today that she says she has ate potato soup and kept it down. She wants to see if she needs to start back to the revlimid 

## 2024-01-12 ENCOUNTER — Other Ambulatory Visit: Payer: Self-pay

## 2024-01-12 ENCOUNTER — Inpatient Hospital Stay

## 2024-01-12 ENCOUNTER — Inpatient Hospital Stay: Attending: Internal Medicine

## 2024-01-12 ENCOUNTER — Inpatient Hospital Stay: Admitting: Hospice and Palliative Medicine

## 2024-01-12 ENCOUNTER — Inpatient Hospital Stay
Admission: EM | Admit: 2024-01-12 | Discharge: 2024-01-22 | DRG: 393 | Disposition: A | Discharge status: Home Health | Source: Ambulatory Visit | Attending: Internal Medicine | Admitting: Internal Medicine

## 2024-01-12 ENCOUNTER — Other Ambulatory Visit: Payer: Self-pay | Admitting: *Deleted

## 2024-01-12 ENCOUNTER — Emergency Department

## 2024-01-12 VITALS — BP 126/58 | HR 81 | Resp 20

## 2024-01-12 DIAGNOSIS — R042 Hemoptysis: Secondary | ICD-10-CM | POA: Diagnosis present

## 2024-01-12 DIAGNOSIS — J690 Pneumonitis due to inhalation of food and vomit: Secondary | ICD-10-CM | POA: Diagnosis not present

## 2024-01-12 DIAGNOSIS — Z83438 Family history of other disorder of lipoprotein metabolism and other lipidemia: Secondary | ICD-10-CM

## 2024-01-12 DIAGNOSIS — K529 Noninfective gastroenteritis and colitis, unspecified: Secondary | ICD-10-CM

## 2024-01-12 DIAGNOSIS — D469 Myelodysplastic syndrome, unspecified: Secondary | ICD-10-CM | POA: Insufficient documentation

## 2024-01-12 DIAGNOSIS — R0609 Other forms of dyspnea: Secondary | ICD-10-CM | POA: Diagnosis not present

## 2024-01-12 DIAGNOSIS — Z4682 Encounter for fitting and adjustment of non-vascular catheter: Secondary | ICD-10-CM | POA: Diagnosis not present

## 2024-01-12 DIAGNOSIS — J189 Pneumonia, unspecified organism: Secondary | ICD-10-CM | POA: Diagnosis not present

## 2024-01-12 DIAGNOSIS — Z833 Family history of diabetes mellitus: Secondary | ICD-10-CM

## 2024-01-12 DIAGNOSIS — R195 Other fecal abnormalities: Secondary | ICD-10-CM | POA: Diagnosis not present

## 2024-01-12 DIAGNOSIS — K219 Gastro-esophageal reflux disease without esophagitis: Secondary | ICD-10-CM | POA: Diagnosis present

## 2024-01-12 DIAGNOSIS — E66811 Obesity, class 1: Secondary | ICD-10-CM | POA: Diagnosis present

## 2024-01-12 DIAGNOSIS — I10 Essential (primary) hypertension: Secondary | ICD-10-CM | POA: Diagnosis present

## 2024-01-12 DIAGNOSIS — R1030 Lower abdominal pain, unspecified: Secondary | ICD-10-CM | POA: Diagnosis not present

## 2024-01-12 DIAGNOSIS — E785 Hyperlipidemia, unspecified: Secondary | ICD-10-CM | POA: Diagnosis present

## 2024-01-12 DIAGNOSIS — K921 Melena: Secondary | ICD-10-CM

## 2024-01-12 DIAGNOSIS — K6389 Other specified diseases of intestine: Secondary | ICD-10-CM | POA: Diagnosis not present

## 2024-01-12 DIAGNOSIS — Z7982 Long term (current) use of aspirin: Secondary | ICD-10-CM

## 2024-01-12 DIAGNOSIS — J9601 Acute respiratory failure with hypoxia: Secondary | ICD-10-CM

## 2024-01-12 DIAGNOSIS — D46Z Other myelodysplastic syndromes: Secondary | ICD-10-CM

## 2024-01-12 DIAGNOSIS — F32A Depression, unspecified: Secondary | ICD-10-CM | POA: Diagnosis present

## 2024-01-12 DIAGNOSIS — Z9221 Personal history of antineoplastic chemotherapy: Secondary | ICD-10-CM

## 2024-01-12 DIAGNOSIS — Z7951 Long term (current) use of inhaled steroids: Secondary | ICD-10-CM

## 2024-01-12 DIAGNOSIS — Z981 Arthrodesis status: Secondary | ICD-10-CM

## 2024-01-12 DIAGNOSIS — K6289 Other specified diseases of anus and rectum: Principal | ICD-10-CM | POA: Diagnosis present

## 2024-01-12 DIAGNOSIS — N179 Acute kidney failure, unspecified: Secondary | ICD-10-CM | POA: Diagnosis not present

## 2024-01-12 DIAGNOSIS — R112 Nausea with vomiting, unspecified: Secondary | ICD-10-CM

## 2024-01-12 DIAGNOSIS — J470 Bronchiectasis with acute lower respiratory infection: Secondary | ICD-10-CM | POA: Diagnosis not present

## 2024-01-12 DIAGNOSIS — Z79899 Other long term (current) drug therapy: Secondary | ICD-10-CM

## 2024-01-12 DIAGNOSIS — Z8249 Family history of ischemic heart disease and other diseases of the circulatory system: Secondary | ICD-10-CM

## 2024-01-12 DIAGNOSIS — D6959 Other secondary thrombocytopenia: Secondary | ICD-10-CM | POA: Diagnosis present

## 2024-01-12 DIAGNOSIS — I2510 Atherosclerotic heart disease of native coronary artery without angina pectoris: Secondary | ICD-10-CM | POA: Diagnosis not present

## 2024-01-12 DIAGNOSIS — D62 Acute posthemorrhagic anemia: Secondary | ICD-10-CM | POA: Diagnosis present

## 2024-01-12 DIAGNOSIS — Z860100 Personal history of colon polyps, unspecified: Secondary | ICD-10-CM

## 2024-01-12 DIAGNOSIS — R918 Other nonspecific abnormal finding of lung field: Secondary | ICD-10-CM | POA: Diagnosis not present

## 2024-01-12 DIAGNOSIS — E876 Hypokalemia: Secondary | ICD-10-CM | POA: Insufficient documentation

## 2024-01-12 DIAGNOSIS — I2609 Other pulmonary embolism with acute cor pulmonale: Secondary | ICD-10-CM | POA: Diagnosis not present

## 2024-01-12 DIAGNOSIS — C9210 Chronic myeloid leukemia, BCR/ABL-positive, not having achieved remission: Secondary | ICD-10-CM | POA: Diagnosis not present

## 2024-01-12 DIAGNOSIS — Z79890 Hormone replacement therapy (postmenopausal): Secondary | ICD-10-CM

## 2024-01-12 DIAGNOSIS — J811 Chronic pulmonary edema: Secondary | ICD-10-CM | POA: Diagnosis not present

## 2024-01-12 DIAGNOSIS — D539 Nutritional anemia, unspecified: Secondary | ICD-10-CM | POA: Diagnosis present

## 2024-01-12 DIAGNOSIS — I700 Atherosclerosis of aorta: Secondary | ICD-10-CM | POA: Diagnosis not present

## 2024-01-12 DIAGNOSIS — J9600 Acute respiratory failure, unspecified whether with hypoxia or hypercapnia: Secondary | ICD-10-CM | POA: Diagnosis not present

## 2024-01-12 DIAGNOSIS — D509 Iron deficiency anemia, unspecified: Secondary | ICD-10-CM | POA: Diagnosis present

## 2024-01-12 DIAGNOSIS — F419 Anxiety disorder, unspecified: Secondary | ICD-10-CM | POA: Diagnosis present

## 2024-01-12 DIAGNOSIS — J441 Chronic obstructive pulmonary disease with (acute) exacerbation: Secondary | ICD-10-CM | POA: Diagnosis not present

## 2024-01-12 DIAGNOSIS — E039 Hypothyroidism, unspecified: Secondary | ICD-10-CM | POA: Diagnosis present

## 2024-01-12 DIAGNOSIS — R0600 Dyspnea, unspecified: Secondary | ICD-10-CM | POA: Diagnosis not present

## 2024-01-12 DIAGNOSIS — E871 Hypo-osmolality and hyponatremia: Secondary | ICD-10-CM | POA: Diagnosis not present

## 2024-01-12 DIAGNOSIS — K317 Polyp of stomach and duodenum: Secondary | ICD-10-CM | POA: Diagnosis present

## 2024-01-12 DIAGNOSIS — J9602 Acute respiratory failure with hypercapnia: Secondary | ICD-10-CM | POA: Diagnosis not present

## 2024-01-12 DIAGNOSIS — R0602 Shortness of breath: Secondary | ICD-10-CM | POA: Diagnosis not present

## 2024-01-12 DIAGNOSIS — Z6833 Body mass index (BMI) 33.0-33.9, adult: Secondary | ICD-10-CM | POA: Diagnosis not present

## 2024-01-12 DIAGNOSIS — R109 Unspecified abdominal pain: Secondary | ICD-10-CM | POA: Diagnosis not present

## 2024-01-12 DIAGNOSIS — I517 Cardiomegaly: Secondary | ICD-10-CM | POA: Diagnosis not present

## 2024-01-12 DIAGNOSIS — R600 Localized edema: Secondary | ICD-10-CM | POA: Diagnosis not present

## 2024-01-12 DIAGNOSIS — R739 Hyperglycemia, unspecified: Secondary | ICD-10-CM | POA: Diagnosis not present

## 2024-01-12 HISTORY — DX: Leukemia, unspecified not having achieved remission: C95.90

## 2024-01-12 HISTORY — DX: Noninfective gastroenteritis and colitis, unspecified: K52.9

## 2024-01-12 LAB — CBC
HCT: 29.8 % — ABNORMAL LOW (ref 36.0–46.0)
Hemoglobin: 9.6 g/dL — ABNORMAL LOW (ref 12.0–15.0)
MCH: 36.8 pg — ABNORMAL HIGH (ref 26.0–34.0)
MCHC: 32.2 g/dL (ref 30.0–36.0)
MCV: 114.2 fL — ABNORMAL HIGH (ref 80.0–100.0)
Platelets: 129 K/uL — ABNORMAL LOW (ref 150–400)
RBC: 2.61 MIL/uL — ABNORMAL LOW (ref 3.87–5.11)
RDW: 13.5 % (ref 11.5–15.5)
WBC: 6 K/uL (ref 4.0–10.5)
nRBC: 0 % (ref 0.0–0.2)

## 2024-01-12 LAB — CBC WITH DIFFERENTIAL (CANCER CENTER ONLY)
Abs Immature Granulocytes: 0.06 K/uL (ref 0.00–0.07)
Basophils Absolute: 0.1 K/uL (ref 0.0–0.1)
Basophils Relative: 2 %
Eosinophils Absolute: 0.2 K/uL (ref 0.0–0.5)
Eosinophils Relative: 3 %
HCT: 28.7 % — ABNORMAL LOW (ref 36.0–46.0)
Hemoglobin: 9.5 g/dL — ABNORMAL LOW (ref 12.0–15.0)
Immature Granulocytes: 1 %
Lymphocytes Relative: 9 %
Lymphs Abs: 0.6 K/uL — ABNORMAL LOW (ref 0.7–4.0)
MCH: 37.1 pg — ABNORMAL HIGH (ref 26.0–34.0)
MCHC: 33.1 g/dL (ref 30.0–36.0)
MCV: 112.1 fL — ABNORMAL HIGH (ref 80.0–100.0)
Monocytes Absolute: 0.7 K/uL (ref 0.1–1.0)
Monocytes Relative: 11 %
Neutro Abs: 4.5 K/uL (ref 1.7–7.7)
Neutrophils Relative %: 74 %
Platelet Count: 126 K/uL — ABNORMAL LOW (ref 150–400)
RBC: 2.56 MIL/uL — ABNORMAL LOW (ref 3.87–5.11)
RDW: 13.4 % (ref 11.5–15.5)
WBC Count: 6.1 K/uL (ref 4.0–10.5)
nRBC: 0 % (ref 0.0–0.2)

## 2024-01-12 LAB — COMPREHENSIVE METABOLIC PANEL WITH GFR
ALT: 12 U/L (ref 0–44)
AST: 13 U/L — ABNORMAL LOW (ref 15–41)
Albumin: 3.6 g/dL (ref 3.5–5.0)
Alkaline Phosphatase: 54 U/L (ref 38–126)
Anion gap: 11 (ref 5–15)
BUN: 7 mg/dL — ABNORMAL LOW (ref 8–23)
CO2: 27 mmol/L (ref 22–32)
Calcium: 8.8 mg/dL — ABNORMAL LOW (ref 8.9–10.3)
Chloride: 104 mmol/L (ref 98–111)
Creatinine, Ser: 0.65 mg/dL (ref 0.44–1.00)
GFR, Estimated: >60 mL/min (ref >60)
Glucose, Bld: 100 mg/dL — ABNORMAL HIGH (ref 70–99)
Potassium: 3.5 mmol/L (ref 3.5–5.1)
Sodium: 142 mmol/L (ref 135–145)
Total Bilirubin: 1.2 mg/dL (ref 0.0–1.2)
Total Protein: 7.1 g/dL (ref 6.5–8.1)

## 2024-01-12 LAB — CMP (CANCER CENTER ONLY)
ALT: 13 U/L (ref 0–44)
AST: 12 U/L — ABNORMAL LOW (ref 15–41)
Albumin: 3.9 g/dL (ref 3.5–5.0)
Alkaline Phosphatase: 60 U/L (ref 38–126)
Anion gap: 9 (ref 5–15)
BUN: 8 mg/dL (ref 8–23)
CO2: 26 mmol/L (ref 22–32)
Calcium: 8.6 mg/dL — ABNORMAL LOW (ref 8.9–10.3)
Chloride: 101 mmol/L (ref 98–111)
Creatinine: 0.77 mg/dL (ref 0.44–1.00)
GFR, Estimated: >60 mL/min (ref >60)
Glucose, Bld: 119 mg/dL — ABNORMAL HIGH (ref 70–99)
Potassium: 3.3 mmol/L — ABNORMAL LOW (ref 3.5–5.1)
Sodium: 136 mmol/L (ref 135–145)
Total Bilirubin: 1.4 mg/dL — ABNORMAL HIGH (ref 0.0–1.2)
Total Protein: 7.2 g/dL (ref 6.5–8.1)

## 2024-01-12 LAB — MAGNESIUM: Magnesium: 2.1 mg/dL (ref 1.7–2.4)

## 2024-01-12 MED ORDER — ONDANSETRON HCL 4 MG PO TABS: 4.0000 mg | ORAL_TABLET | Freq: Four times a day (QID) | ORAL | Status: DC | PRN

## 2024-01-12 MED ORDER — MORPHINE SULFATE (PF) 4 MG/ML IV SOLN
4.0000 mg | Freq: Once | INTRAVENOUS | Status: AC
  Administered 2024-01-12: 4 mg via INTRAVENOUS
  Filled 2024-01-12: qty 1

## 2024-01-12 MED ORDER — PANTOPRAZOLE SODIUM 40 MG PO TBEC
40.0000 mg | DELAYED_RELEASE_TABLET | Freq: Every day | ORAL | Status: DC
Start: 2024-01-13 — End: 2024-01-13

## 2024-01-12 MED ORDER — LEVOTHYROXINE SODIUM 50 MCG PO TABS
50.0000 ug | ORAL_TABLET | Freq: Every day | ORAL | Status: DC
  Administered 2024-01-13 – 2024-01-14 (×2): 50 ug via ORAL
  Filled 2024-01-12 (×3): qty 1

## 2024-01-12 MED ORDER — DEXTROSE-SODIUM CHLORIDE 5-0.45 % IV SOLN: INTRAVENOUS | Status: AC

## 2024-01-12 MED ORDER — ACETAMINOPHEN 650 MG RE SUPP: 650.0000 mg | Freq: Four times a day (QID) | RECTAL | Status: DC | PRN

## 2024-01-12 MED ORDER — TRAZODONE HCL 50 MG PO TABS
25.0000 mg | ORAL_TABLET | Freq: Every evening | ORAL | Status: DC | PRN
  Administered 2024-01-12: 25 mg via ORAL
  Filled 2024-01-12 (×2): qty 1

## 2024-01-12 MED ORDER — SODIUM CHLORIDE 0.9 % IV BOLUS
1000.0000 mL | Freq: Once | INTRAVENOUS | Status: AC
  Administered 2024-01-12: 1000 mL via INTRAVENOUS

## 2024-01-12 MED ORDER — ROSUVASTATIN CALCIUM 10 MG PO TABS
10.0000 mg | ORAL_TABLET | Freq: Every day | ORAL | Status: DC
Start: 2024-01-13 — End: 2024-01-12

## 2024-01-12 MED ORDER — ALBUTEROL SULFATE (2.5 MG/3ML) 0.083% IN NEBU
2.5000 mg | INHALATION_SOLUTION | RESPIRATORY_TRACT | Status: DC | PRN
  Administered 2024-01-14 (×2): 2.5 mg via RESPIRATORY_TRACT
  Filled 2024-01-12 (×2): qty 3

## 2024-01-12 MED ORDER — AZITHROMYCIN 500 MG PO TABS: 250.0000 mg | ORAL_TABLET | ORAL | Status: DC

## 2024-01-12 MED ORDER — GABAPENTIN 100 MG PO CAPS
100.0000 mg | ORAL_CAPSULE | Freq: Three times a day (TID) | ORAL | Status: DC
Start: 2024-01-12 — End: 2024-01-15
  Administered 2024-01-12 – 2024-01-14 (×7): 100 mg via ORAL
  Filled 2024-01-12 (×7): qty 1

## 2024-01-12 MED ORDER — METRONIDAZOLE 500 MG/100ML IV SOLN
500.0000 mg | Freq: Once | INTRAVENOUS | Status: AC
  Administered 2024-01-12: 500 mg via INTRAVENOUS
  Filled 2024-01-12: qty 100

## 2024-01-12 MED ORDER — ENOXAPARIN SODIUM 40 MG/0.4ML IJ SOSY
40.0000 mg | PREFILLED_SYRINGE | INTRAMUSCULAR | Status: DC
  Filled 2024-01-12: qty 0.4

## 2024-01-12 MED ORDER — CITALOPRAM HYDROBROMIDE 20 MG PO TABS
20.0000 mg | ORAL_TABLET | Freq: Every day | ORAL | Status: DC
Start: 2024-01-13 — End: 2024-01-16
  Administered 2024-01-13 – 2024-01-14 (×2): 20 mg via ORAL
  Filled 2024-01-12 (×2): qty 1

## 2024-01-12 MED ORDER — BUDESON-GLYCOPYRROL-FORMOTEROL 160-9-4.8 MCG/ACT IN AERO
2.0000 | INHALATION_SPRAY | Freq: Two times a day (BID) | RESPIRATORY_TRACT | Status: DC
  Administered 2024-01-13 – 2024-01-14 (×5): 2 via RESPIRATORY_TRACT
  Filled 2024-01-12 (×2): qty 5.9

## 2024-01-12 MED ORDER — ACETAMINOPHEN 325 MG PO TABS: 650.0000 mg | ORAL_TABLET | Freq: Four times a day (QID) | ORAL | Status: DC | PRN

## 2024-01-12 MED ORDER — SODIUM CHLORIDE 0.9 % IV SOLN
2.0000 g | Freq: Once | INTRAVENOUS | Status: AC
  Administered 2024-01-12: 2 g via INTRAVENOUS
  Filled 2024-01-12: qty 20

## 2024-01-12 MED ORDER — ASPIRIN 81 MG PO TBEC
81.0000 mg | DELAYED_RELEASE_TABLET | Freq: Every day | ORAL | Status: DC
Start: 2024-01-13 — End: 2024-01-12

## 2024-01-12 MED ORDER — SODIUM CHLORIDE 0.9 % IV SOLN
2.0000 g | Freq: Every day | INTRAVENOUS | Status: DC
  Filled 2024-01-12: qty 20

## 2024-01-12 MED ORDER — HYDROMORPHONE HCL 1 MG/ML IJ SOLN
0.5000 mg | INTRAMUSCULAR | Status: DC | PRN
  Administered 2024-01-13 – 2024-01-15 (×6): 0.5 mg via INTRAVENOUS
  Filled 2024-01-12 (×7): qty 0.5

## 2024-01-12 MED ORDER — LORATADINE 10 MG PO TABS
10.0000 mg | ORAL_TABLET | Freq: Every day | ORAL | Status: DC
  Administered 2024-01-13 – 2024-01-14 (×2): 10 mg via ORAL
  Filled 2024-01-12 (×2): qty 1

## 2024-01-12 MED ORDER — BOOST / RESOURCE BREEZE PO LIQD CUSTOM
1.0000 | Freq: Three times a day (TID) | ORAL | Status: DC
Start: 2024-01-12 — End: 2024-01-15

## 2024-01-12 MED ORDER — FAMOTIDINE 20 MG PO TABS
20.0000 mg | ORAL_TABLET | Freq: Every day | ORAL | Status: DC
  Administered 2024-01-12 – 2024-01-14 (×3): 20 mg via ORAL
  Filled 2024-01-12 (×3): qty 1

## 2024-01-12 MED ORDER — ONDANSETRON HCL 4 MG/2ML IJ SOLN
4.0000 mg | Freq: Four times a day (QID) | INTRAMUSCULAR | Status: DC | PRN
  Administered 2024-01-13 – 2024-01-14 (×3): 4 mg via INTRAVENOUS
  Filled 2024-01-12 (×4): qty 2

## 2024-01-12 MED ORDER — AMLODIPINE BESYLATE 5 MG PO TABS
5.0000 mg | ORAL_TABLET | Freq: Every day | ORAL | Status: DC
Start: 2024-01-13 — End: 2024-01-16
  Administered 2024-01-13 – 2024-01-14 (×2): 5 mg via ORAL
  Filled 2024-01-12 (×2): qty 1

## 2024-01-12 MED ORDER — METRONIDAZOLE 500 MG/100ML IV SOLN
500.0000 mg | Freq: Two times a day (BID) | INTRAVENOUS | Status: DC
  Administered 2024-01-12: 500 mg via INTRAVENOUS
  Filled 2024-01-12 (×2): qty 100

## 2024-01-12 MED ORDER — ADULT MULTIVITAMIN W/MINERALS CH
1.0000 | ORAL_TABLET | Freq: Every day | ORAL | Status: DC
  Administered 2024-01-13 – 2024-01-14 (×2): 1 via ORAL
  Filled 2024-01-12 (×2): qty 1

## 2024-01-12 NOTE — ED Notes (Signed)
 Pharmacy called about IV infiltration. Pharmacy made aware that rocephin  was infusing when IV infiltrated. Per pharmacy, no recommendations at this time. Pt arm elevated and IV removed.

## 2024-01-12 NOTE — Progress Notes (Signed)
 Black tarry mucous stool, nausea but meds help. Antibiotic brought pain to a 4/10, was 10/10.

## 2024-01-12 NOTE — ED Triage Notes (Signed)
 [facility] for abdominal pain and dark tarry stools; was seen and offered admission on 01/09/24 but declined.

## 2024-01-12 NOTE — Hospital Course (Addendum)
 73 yo female who presented to [facility] ER on 09/19 with c/o LLQ abdominal pain with black stool onset several days prior to presentation.  Per ED notes on 09/16 pt was sent over from the cancer center to the ER with abdominal pain and dark tarry stools.  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  She received metronidazole  and ceftriaxone .  EDP recommended hospital admission, however pt refused.  She was discharged from the ER and prescribed Augmentin  and instructed to follow-up with her PCP.     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  However, treatment put on hold since 09/18 per oncologist recommendations due to colitis dx.  She has chronic dyspnea at baseline, but reports this has worsened along with worsening bilateral lower extremity edema she suspected this is due to chemotherapy treatment.    ED Course  Upon arrival to the ER during this presentation pt reported worsening abdominal pain, nausea, and poor po intake despite compliance with Augmentin .  Significant lab results: K+ 3.3/calcium  8.6/total bilirubin 1.4/hgb 9.5/platelet count 126.  CXR negative for acute cardiopulmonary disease.  Pt received ceftriaxone /metronidazole /4 mg of iv morphine /1L NS bolus.  Pt admitted to the [facility] per hospitalist team for additional workup and treatment.   09/19: Admitted to the medsurg unit with acute GI bleed and proctocolitis  09/20: GI consulted and scheduled EGD and colonoscopy with possible biopsy on 09/22 09/22: Pt ingested bowel prep overnight and developed acute hypoxic respiratory failure secondary to aspiration following multiple episodes of emesis requiring transfer to ICU and mechanical intubation  09/23: Patient extubated to HFNC and alternating with Bipap.   9/25.  Medical team took over case.  Patient on 100% heated high flow nasal cannula, 30 L flow.  Apparently later in the afternoon there was a  kink in the heated high flow nasal cannula which just made it appear like she was on 100% oxygen.  Tapered oxygen over the day. 9/26.  Patient complains of coughing up a little blood.  Down to 2 L nasal cannula this morning and over to room air this afternoon. 9/27 and 9/28.  Patient still off oxygen.  Waiting to hear back from insurance about acute inpatient rehab.  Gastroenterology will sign up for an EGD tomorrow.  Patient declined colonoscopy. 9/29.  Hemoglobin 8.5, creatinine 7.6.  Patient feeling well.  EGD showing normal esophagus and duodenum and gastric polyps in the stomach.  No signs of bleeding.  Patient interested in going home.

## 2024-01-12 NOTE — ED Notes (Signed)
 Patient placed on 2L NC.

## 2024-01-12 NOTE — ED Notes (Signed)
 Lab called to ensure CMP was running. Per lab CMP is running and in process at this time.

## 2024-01-12 NOTE — H&P (Addendum)
 History and Physical    Patient: Sarah [name] FMW:[id number] DOB: 01-Jun-1950 DOA: 01/12/2024 DOS: the patient was seen and examined on 01/12/2024 PCP: [name], MD  Patient coming from: Oncology clinic   Chief Complaint:  Chief Complaint  Patient presents with   Abdominal Pain   HPI: Sarah [name] is a 73 y.o. female with medical history significant of MDS, Asthma/bronchiectasis, GERD, HTN, Hypothyroidism, and HTN.    Patient states that her symptoms first began about 9 days prior to admission.  At that time she was having frequent bowel movements.  She did not note any blood in her stools.  Around 6 days prior to admission she started having small dark stools with mucus which she describes as dark tarry stools.  She continued to have these bowel movements since that time. And states her last small bowel movement was early on the day of admission.  She also has some associated left lower quadrant abdominal pain, poor appetite, and some intermittent nausea.  She presented to the ED 3d prior to today's admission and a CT scan of the abdomen pelvis revealed proctocolitis.  She received 1 dose of IV ceftriaxone  and Flagyl  and was discharged on Augmentin  as patient did not want to be admitted to the hospital.    Since her ED visit, she states that she continued to have dark stools, worsening left lower quadrant abdominal pain, nausea, and anorexia.  She did have an episode of emesis after taking her oral chemo medication Revlimid .  She had no hematemesis.  She denied fever but had some mild chills.  She saw her oncologist this a.m. and because of her worsening symptoms it was recommended that she come to the hospital for further evaluation.  Patient also notes chronic dyspnea that has worsened since being on her chemotherapeutic and worsening lower extremity edema, no chest pain, no difficulty urinating.  Review of Systems: As mentioned in the history of present illness. All other  systems reviewed and are negative. Past Medical History:  Diagnosis Date   Arthritis    Asthma    CAP (community acquired pneumonia) 08/11/2022   Depression    Dyspnea    with heat   Environmental and seasonal allergies    GERD (gastroesophageal reflux disease)    Hyperlipidemia    Hypertension    Hypothyroidism    Leukemia (HCC)    Presence of dental prosthetic device    Dental implants   Wheezing    Past Surgical History:  Procedure Laterality Date   ABDOMINAL HYSTERECTOMY  04/26/1987   ANTERIOR CERVICAL DECOMPRESSION/DISCECTOMY FUSION 4 LEVELS N/A 11/10/2021   Procedure: C3-7 ANTERIOR CERVICAL DISCECTOMY AND FUSION (GLOBUS FORGE);  Surgeon: Clois Fret, MD;  Location: [facility] ORS;  Service: Neurosurgery;  Laterality: N/A;   BROW LIFT Bilateral 02/07/2020   Procedure: BLEPHAROPLASTY UPPER EYELID; W/EXCESS SKIN BROW PTOSIS REPAIR BILATERAL;  Surgeon: Ashley Greig HERO, MD;  Location: [facility];  Service: Ophthalmology;  Laterality: Bilateral;   CATARACT EXTRACTION W/PHACO Right 09/13/2016   Procedure: CATARACT EXTRACTION PHACO AND INTRAOCULAR LENS PLACEMENT (IOC);  Surgeon: Jaye Fallow, MD;  Location: [facility] ORS;  Service: Ophthalmology;  Laterality: Right;  US  00:38 AP% 14.3 CDE 5.51 Fluid pack lot # [id number]   CATARACT EXTRACTION W/PHACO Left 10/11/2016   Procedure: CATARACT EXTRACTION PHACO AND INTRAOCULAR LENS PLACEMENT (IOC);  Surgeon: Jaye Fallow, MD;  Location: [facility] ORS;  Service: Ophthalmology;  Laterality: Left;  US  00:30 AP% 11.3 CDE 3.50 fluid pack lot # [id number]  COLONOSCOPY WITH PROPOFOL  N/A 09/30/2014   Procedure: COLONOSCOPY WITH PROPOFOL ;  Surgeon: Reyes LELON Cota, MD;  Location: [facility] ENDOSCOPY;  Service: Endoscopy;  Laterality: N/A;   COLONOSCOPY WITH PROPOFOL  N/A 03/24/2021   Procedure: COLONOSCOPY WITH PROPOFOL ;  Surgeon: Unk Corinn Skiff, MD;  Location: [facility] END[facility]COPY;  Service: Gastroenterology;  Laterality: N/A;    ESOPHAGOGASTRODUODENOSCOPY N/A 09/30/2014   Procedure: ESOPHAGOGASTRODUODENOSCOPY (EGD);  Surgeon: Reyes LELON Cota, MD;  Location: [facility] END[facility]COPY;  Service: Endoscopy;  Laterality: N/A;   ESOPHAGOGASTRODUODENOSCOPY N/A 03/24/2021   Procedure: ESOPHAGOGASTRODUODENOSCOPY (EGD);  Surgeon: Unk Corinn Skiff, MD;  Location: [facility] ENDOSCOPY;  Service: Gastroenterology;  Laterality: N/A;   FOOT SURGERY Bilateral 04/26/1983   KNEE SURGERY  04/25/2014   Social History:  reports that she has never smoked. She has never used smokeless tobacco. She reports that she does not drink alcohol and does not use drugs.  No Known Allergies  Family History  Problem Relation Age of Onset   Diabetes Mother    Hyperlipidemia Mother    Heart disease Mother    Prostate cancer Neg Hx    Kidney cancer Neg Hx    Bladder Cancer Neg Hx     Prior to Admission medications   Medication Sig Start Date End Date Taking? Authorizing Provider  albuterol  (VENTOLIN  HFA) 108 (90 Base) MCG/ACT inhaler Inhale 1-2 puffs into the lungs every 6 (six) hours as needed for wheezing or shortness of breath. 05/05/23  Yes [name], MD  amLODipine  (NORVASC ) 5 MG tablet TAKE 1 TABLET BY MOUTH DAILY 08/14/23  Yes Tullo, Teresa L, MD  aspirin  EC 81 MG tablet Take 81 mg by mouth daily. Swallow whole.   Yes [provider]  azithromycin  (ZITHROMAX ) 250 MG tablet Take 1 tablet (250 mg total) by mouth 3 (three) times a week. M-W-F 12/08/23  Yes Tamea Dedra CROME, MD  Calcium  Citrate-Vitamin D  (CALCIUM  CITRATE + D PO) Take 1 tablet by mouth 2 (two) times daily.   Yes [provider]  citalopram  (CELEXA ) 20 MG tablet TAKE 1 TABLET BY MOUTH DAILY 12/26/23  Yes Tullo, Teresa L, MD  famotidine  (PEPCID ) 20 MG tablet TAKE 1 TABLET BY MOUTH DAILY. BEFORE DINNER 07/28/17  Yes [name], MD  ferrous sulfate  324 MG TBEC Take 2 tablets by mouth. 65 mg of elemental iron    Yes [provider]  fexofenadine (ALLEGRA) 180 MG  tablet Take 180 mg by mouth daily.   Yes [provider]  gabapentin  (NEURONTIN ) 100 MG capsule TAKE 1 CAPSULE BY MOUTH 3 TIMES  DAILY 11/10/23  Yes [name], MD  lenalidomide  (REVLIMID ) 10 MG capsule Take 1 capsule (10 mg total) by mouth daily. Take for 21 days, then hold for 7 days. Repeat every 28 days. 01/02/24  Yes [name], MD  levothyroxine  (SYNTHROID ) 50 MCG tablet TAKE 1 TABLET BY MOUTH DAILY ON  AN EMPTY STOMACH WITH A GLASS OF WATER  AT LEAST 30 TO 60 MINUTES  BEFORE BREAKFAST 08/14/23  Yes [name], MD  pantoprazole  (PROTONIX ) 40 MG tablet TAKE 1 TABLET BY MOUTH DAILY 08/14/23  Yes Tullo, Teresa L, MD  rosuvastatin  (CRESTOR ) 10 MG tablet TAKE ONE TABLET BY MOUTH ONCE DAILY 06/29/23  Yes [name], MD  solifenacin  (VESICARE ) 5 MG tablet Take 1 tablet (5 mg total) by mouth daily. 11/06/23  Yes [name], MD  traZODone  (DESYREL ) 50 MG tablet TAKE 1/2 TO 1 TABLET BY MOUTH AT BEDTIME AS NEEDED FOR SLEEP 07/24/23  Yes [name], MD  vitamin B-12 (CYANOCOBALAMIN ) 1000 MCG tablet Take 1,000 mcg by mouth daily.   Yes [provider]  Fluticasone -Umeclidin-Vilant (TRELEGY ELLIPTA ) 200-62.5-25 MCG/ACT AEPB Inhale 1 puff into the lungs daily. 09/26/23   Tamea Dedra CROME, MD  ondansetron  (ZOFRAN ) 8 MG tablet One pill every 8 hours as needed for nausea/vomitting. Patient not taking: No sig reported 12/05/23   [name], MD    Physical Exam: Vitals:   01/12/24 1345 01/12/24 1500 01/12/24 1517 01/12/24 1600  BP: (!) 146/66 137/61  (!) 113/52  Pulse: 79 76 76 75  Resp: 17 (!) 23 20 (!) 22  Temp:      TempSrc:      SpO2: 100% 100% 100% 100%  Weight:      Height:       Physical Exam  Constitutional: In no distress.  Cardiovascular: Normal rate, regular rhythm.  Pulmonary: Non labored breathing on Elbert, Mild wheezing diffusely no rales  Abdominal: Soft. Non distended and non tender Musculoskeletal: Normal range of motion.      Neurological: Alert and oriented to person, place, and time. Non focal  Skin: Skin is warm and dry.  1+ edema of RLE, trace LLE  Data Reviewed:     Latest Ref Rng & Units 01/12/2024   12:29 PM 01/12/2024   10:38 AM 01/09/2024    4:07 PM  CBC  WBC 4.0 - 10.5 K/uL 6.0  6.1  5.1   Hemoglobin 12.0 - 15.0 g/dL 9.6  9.5  9.2   Hematocrit 36.0 - 46.0 % 29.8  28.7  28.5   Platelets 150 - 400 K/uL 129  126  103       Latest Ref Rng & Units 01/12/2024    2:30 PM 01/12/2024   10:38 AM 01/09/2024    4:07 PM  BMP  Glucose 70 - 99 mg/dL 899  880  98   BUN 8 - 23 mg/dL 7  8  10    Creatinine 0.44 - 1.00 mg/dL 9.34  9.22  9.15   Sodium 135 - 145 mmol/L 142  136  140   Potassium 3.5 - 5.1 mmol/L 3.5  3.3  3.8   Chloride 98 - 111 mmol/L 104  101  107   CO2 22 - 32 mmol/L 27  26  23    Calcium  8.9 - 10.3 mg/dL 8.8  8.6  9.2       Assessment and Plan: Proctocolitis  Patient afebrile and no leukocytosis leukopenia though noted to have MDS.  Augmentin  ineffective symptoms worsen.  Received IV ceftriaxone  and Flagyl  in the ED. - Continue antibiotics - If pain and anorexia do not improve we will reimage - Will consult GI in the AM given dark stools, patient is HDS and Hgb appears to be at baseline.  - Maintenance IV fluids  MDS Blood counts are stable. Continue to monitor.   Asthma Bronchiectasis She is on azithromycin , trelegy, and antihistamine.  -Continue home medications  GERD Continue home medications  Class 1 obesity  BMI 33 complicates care   HLD Hold home statin   Hypothyroidism Continue home levothyroxine   Hypertension Continue home amlodipine    Advance Care Planning:   Code Status: Full Code   Consults: None  Family Communication: Friend at bedside  Severity of Illness: The appropriate patient status for this patient is INPATIENT. Inpatient status is judged to be reasonable and necessary in order to provide the required intensity of service to ensure the  patient's  safety. The patient's presenting symptoms, physical exam findings, and initial radiographic and laboratory data in the context of their chronic comorbidities is felt to place them at high risk for further clinical deterioration. Furthermore, it is not anticipated that the patient will be medically stable for discharge from the hospital within 2 midnights of admission.   * I certify that at the point of admission it is my clinical judgment that the patient will require inpatient hospital care spanning beyond 2 midnights from the point of admission due to high intensity of service, high risk for further deterioration and high frequency of surveillance required.*  Author: Alban Pepper, MD 01/12/2024 5:26 PM  For on call review [email or web address].

## 2024-01-12 NOTE — Telephone Encounter (Signed)
 Spoke with patient- she can be here by 1030. Lab/smc- Josh and IV fluid apt scheduled.

## 2024-01-12 NOTE — ED Provider Notes (Signed)
 [facility] Provider Note    Event Date/Time   First MD Initiated Contact with Patient 01/12/24 1345     (approximate)   History   Chief Complaint: Abdominal Pain   HPI  Sarah [name] is a 73 y.o. female with a history of CML, hypertension who comes to the ED today complaining of persistent left lower quadrant abdominal pain with black stool over the past several days.  She was seen in the ED 3 days ago, diagnosed with colitis, recommended for hospitalization but she declined at the time.  She was given prescription for Augmentin , but she reports that since leaving the hospital she said persistent, worsening symptoms with abdominal pain, nausea, difficulty with oral intake and medication compliance.  Denies fever or chest pain, does endorse some shortness of breath without cough.  Was seen at cancer center today, sent to the ED for further evaluation.        Past Medical History:  Diagnosis Date   Arthritis    Asthma    CAP (community acquired pneumonia) 08/11/2022   Depression    Dyspnea    with heat   Environmental and seasonal allergies    GERD (gastroesophageal reflux disease)    Hyperlipidemia    Hypertension    Hypothyroidism    Leukemia (HCC)    Presence of dental prosthetic device    Dental implants   Wheezing     Current Outpatient Rx   Order #: [id number] Class: Normal   Order #: [id number] Class: Normal   Order #: [id number] Class: Normal   Order #: [id number] Class: Historical Med   Order #: [id number] Class: Normal   Order #: [id number] Class: Normal   Order #: [id number] Class: Historical Med   Order #: [id number] Class: Normal   Order #: [id number] Class: Normal   Order #: [id number] Class: Historical Med   Order #: [id number] Class: Historical Med   Order #: [id number] Class: Normal   Order #: [id number] Class: Sample   Order #: [id number] Class: Sample   Order #: [id number] Class: Normal   Order #: [id number] Class: Normal   Order #:  [id number] Class: Normal   Order #: [id number] Class: Normal   Order #: [id number] Class: Normal   Order #: [id number] Class: Normal   Order #: [id number] Class: Normal   Order #: [id number] Class: Normal   Order #: [id number] Class: Historical Med    Past Surgical History:  Procedure Laterality Date   ABDOMINAL HYSTERECTOMY  04/26/1987   ANTERIOR CERVICAL DECOMPRESSION/DISCECTOMY FUSION 4 LEVELS N/A 11/10/2021   Procedure: C3-7 ANTERIOR CERVICAL DISCECTOMY AND FUSION (GLOBUS FORGE);  Surgeon: Clois Fret, MD;  Location: [facility];  Service: Neurosurgery;  Laterality: N/A;   BROW LIFT Bilateral 02/07/2020   Procedure: BLEPHAROPLASTY UPPER EYELID; W/EXCESS SKIN BROW PTOSIS REPAIR BILATERAL;  Surgeon: Ashley Greig HERO, MD;  Location: [facility];  Service: Ophthalmology;  Laterality: Bilateral;   CATARACT EXTRACTION W/PHACO Right 09/13/2016   Procedure: CATARACT EXTRACTION PHACO AND INTRAOCULAR LENS PLACEMENT (IOC);  Surgeon: Jaye Fallow, MD;  Location: [facility];  Service: Ophthalmology;  Laterality: Right;  US  00:38 AP% 14.3 CDE 5.51 Fluid pack lot # [id number]   CATARACT EXTRACTION W/PHACO Left 10/11/2016   Procedure: CATARACT EXTRACTION PHACO AND INTRAOCULAR LENS PLACEMENT (IOC);  Surgeon: Jaye Fallow, MD;  Location: [facility];  Service: Ophthalmology;  Laterality: Left;  US  00:30 AP% 11.3 CDE 3.50 fluid pack lot # [id number]   COLONOSCOPY WITH PROPOFOL  N/A 09/30/2014   Procedure: COLONOSCOPY WITH PROPOFOL ;  Surgeon: Reyes LELON Cota, MD;  Location: [facility];  Service: Endoscopy;  Laterality: N/A;  COLONOSCOPY WITH PROPOFOL  N/A 03/24/2021   Procedure: COLONOSCOPY WITH PROPOFOL ;  Surgeon: Unk Corinn Skiff, MD;  Location: [facility] ENDOSCOPY;  Service: Gastroenterology;  Laterality: N/A;   ESOPHAGOGASTRODUODENOSCOPY N/A 09/30/2014   Procedure: ESOPHAGOGASTRODUODENOSCOPY (EGD);  Surgeon: Reyes LELON Cota, MD;  Location: [facility] END[facility]COPY;  Service: Endoscopy;  Laterality: N/A;    ESOPHAGOGASTRODUODENOSCOPY N/A 03/24/2021   Procedure: ESOPHAGOGASTRODUODENOSCOPY (EGD);  Surgeon: Unk Corinn Skiff, MD;  Location: [facility]COPY;  Service: Gastroenterology;  Laterality: N/A;   FOOT SURGERY Bilateral 04/26/1983   KNEE SURGERY  04/25/2014    Physical Exam   Triage Vital Signs: ED Triage Vitals  Encounter Vitals Group     BP 01/12/24 1224 (!) 134/56     Girls Systolic BP Percentile --      Girls Diastolic BP Percentile --      Boys Systolic BP Percentile --      Boys Diastolic BP Percentile --      Pulse Rate 01/12/24 1224 86     Resp 01/12/24 1224 18     Temp 01/12/24 1224 97.6 F (36.4 C)     Temp Source 01/12/24 1224 Oral     SpO2 01/12/24 1224 (!) 89 %     Weight 01/12/24 1229 190 lb (86.2 kg)     Height 01/12/24 1229 5' 3 (1.6 m)     Head Circumference --      Peak Flow --      Pain Score 01/12/24 1224 6     Pain Loc --      Pain Education --      Exclude from Growth Chart --     Most recent vital signs: Vitals:   01/12/24 1228 01/12/24 1345  BP:  (!) 146/66  Pulse:  79  Resp:  17  Temp:    SpO2: 95% 100%    General: Awake, no distress.  CV:  Good peripheral perfusion.  Regular rate rhythm Resp:  Normal effort.  Clear lungs.  On my exam, oxygen saturation remains 95 to 96% on room air. Abd:  No distention.  Soft with left lower quadrant tenderness Other:  Dry oral mucosa   ED Results / Procedures / Treatments   Labs (all labs ordered are listed, but only abnormal results are displayed) Labs Reviewed  CBC - Abnormal; Notable for the following components:      Result Value   RBC 2.61 (*)    Hemoglobin 9.6 (*)    HCT 29.8 (*)    MCV 114.2 (*)    MCH 36.8 (*)    Platelets 129 (*)    All other components within normal limits  URINALYSIS, ROUTINE W REFLEX MICROSCOPIC  COMPREHENSIVE METABOLIC PANEL WITH GFR     EKG Interpreted by me Sinus rhythm rate of 76.  Right axis, right bundle branch block.  Prolonged QTc of 517 ms.  No  acute ischemic changes.     RADIOLOGY Chest x-ray interpreted by me, unremarkable.  Radiology report reviewed   PROCEDURES:  Procedures   MEDICATIONS ORDERED IN ED: Medications  cefTRIAXone  (ROCEPHIN ) 2 g in sodium chloride  0.9 % 100 mL IVPB (2 g Intravenous New Bag/Given 01/12/24 1448)  metroNIDAZOLE  (FLAGYL ) IVPB 500 mg (has no administration in time range)  morphine  (PF) 4 MG/ML injection 4 mg (4 mg Intravenous Given 01/12/24 1442)  sodium chloride  0.9 % bolus 1,000 mL (1,000 mLs Intravenous New Bag/Given 01/12/24 1446)     IMPRESSION / MDM / ASSESSMENT AND PLAN / ED COURSE  I reviewed the  triage vital signs and the nursing notes.  DDx: Dehydration, AKI, electrolyte derangement, anemia, UTI, persistent colitis  Patient's presentation is most consistent with acute presentation with potential threat to life or bodily function.  Patient presents with persistent abdominal pain, very symptomatic with unable to adequately control symptoms, take medications, remain hydrated at home.  Will need to hospitalize for hydration after obtaining labs, chest x-ray.   ----------------------------------------- 3:03 PM on 01/12/2024 ----------------------------------------- Lab requested recollect of chemistry      FINAL CLINICAL IMPRESSION(S) / ED DIAGNOSES   Final diagnoses:  Colitis  CML (chronic myelocytic leukemia) (HCC)     Rx / DC Orders   ED Discharge Orders     None        Note:  This document was prepared using Dragon voice recognition software and may include unintentional dictation errors.   Viviann Pastor, MD 01/12/24 [phone]

## 2024-01-12 NOTE — Progress Notes (Signed)
 Symptom Management Clinic [facility] at [facility] [phone] [phone]  Patient Care Team: [name], MD as PCP - General (Internal Medicine) [name], MD as PCP - Cardiology (Cardiology) [name], MD (Internal Medicine) [name] [name], MD (General Surgery) [name], MD as Consulting Physician (Oncology)   NAME OF PATIENT: Sarah [name]  [id number]  06/10/1950   DATE OF VISIT: 01/12/24  REASON FOR CONSULT: Sarah [name] is a 73 y.o. female with multiple medical problems including low-grade MDS.  Patient on treatment with Revlimid .  INTERVAL HISTORY: Patient presented to emergency department on 01/09/2024 for abdominal pain.  CT was suggestive of proctosigmoid colitis without evidence of diverticulitis or abscess.  Patient received IV fluids and was started on metronidazole  and Rocephin .  Patient declined admission.  She was sent home on Augmentin .  Patient presents to clinic today with complaints of worsening abdominal pain.  She says that she is having frequent diarrhea, which she describes as dark and tarry stools.  She describes worsening shortness of breath.  Denies chest pain.  Denies fever or chills.  Reports urinary urgency and frequency.  No appetite.  She says that she is not able to keep down food or fluids with frequent nausea and vomiting.  Denies any neurologic complaints. Patient offers no further specific complaints today.   PAST MEDICAL HISTORY: Past Medical History:  Diagnosis Date   Arthritis    Asthma    CAP (community acquired pneumonia) 08/11/2022   Depression    Dyspnea    with heat   Environmental and seasonal allergies    GERD (gastroesophageal reflux disease)    Hyperlipidemia    Hypertension    Hypothyroidism    Presence of dental prosthetic device    Dental implants   Wheezing     PAST SURGICAL HISTORY:  Past Surgical History:  Procedure Laterality Date    ABDOMINAL HYSTERECTOMY  04/26/1987   ANTERIOR CERVICAL DECOMPRESSION/DISCECTOMY FUSION 4 LEVELS N/A 11/10/2021   Procedure: C3-7 ANTERIOR CERVICAL DISCECTOMY AND FUSION (GLOBUS FORGE);  Surgeon: Clois Fret, MD;  Location: [facility];  Service: Neurosurgery;  Laterality: N/A;   BROW LIFT Bilateral 02/07/2020   Procedure: BLEPHAROPLASTY UPPER EYELID; W/EXCESS SKIN BROW PTOSIS REPAIR BILATERAL;  Surgeon: Ashley Greig HERO, MD;  Location: [facility];  Service: Ophthalmology;  Laterality: Bilateral;   CATARACT EXTRACTION W/PHACO Right 09/13/2016   Procedure: CATARACT EXTRACTION PHACO AND INTRAOCULAR LENS PLACEMENT (IOC);  Surgeon: Jaye Fallow, MD;  Location: [facility];  Service: Ophthalmology;  Laterality: Right;  US  00:38 AP% 14.3 CDE 5.51 Fluid pack lot # [id number] H   CATARACT EXTRACTION W/PHACO Left 10/11/2016   Procedure: CATARACT EXTRACTION PHACO AND INTRAOCULAR LENS PLACEMENT (IOC);  Surgeon: Jaye Fallow, MD;  Location: [facility];  Service: Ophthalmology;  Laterality: Left;  US  00:30 AP% 11.3 CDE 3.50 fluid pack lot # [id number] H   COLONOSCOPY WITH PROPOFOL  N/A 09/30/2014   Procedure: COLONOSCOPY WITH PROPOFOL ;  Surgeon: [name] [name], MD;  Location: [facility] ENDOSCOPY;  Service: Endoscopy;  Laterality: N/A;   COLONOSCOPY WITH PROPOFOL  N/A 03/24/2021   Procedure: COLONOSCOPY WITH PROPOFOL ;  Surgeon: Unk Corinn Skiff, MD;  Location: [facility] ENDOSCOPY;  Service: Gastroenterology;  Laterality: N/A;   ESOPHAGOGASTRODUODENOSCOPY N/A 09/30/2014   Procedure: ESOPHAGOGASTRODUODENOSCOPY (EGD);  Surgeon: [name] [name], MD;  Location: [facility] ENDOSCOPY;  Service: Endoscopy;  Laterality: N/A;   ESOPHAGOGASTRODUODENOSCOPY N/A 03/24/2021   Procedure: ESOPHAGOGASTRODUODENOSCOPY (EGD);  Surgeon: Unk Corinn Skiff, MD;  Location: [facility] ENDOSCOPY;  Service: Gastroenterology;  Laterality: N/A;   FOOT SURGERY Bilateral 04/26/1983   KNEE SURGERY  04/25/2014    HEMATOLOGY/ONCOLOGY HISTORY:   Oncology History Overview Note  DEC 2023- DIAGNOSIS: LOW GRADE MDS- 5 Q DEL;   # MACROCYTIC ANEMIA [since 2022- April hb 11; MCV-103; platelet- 120s] EGD/ colonoscopy >>> 10 years ago; OCT 2022-slight improvement blood counts/stable; hepatitis HIV negative.  Myeloma panel negative.   BONE MARROW, ASPIRATE, CLOT, CORE: -Hypercellular bone marrow for age with dyspoietic changes -See comment  PERIPHERAL BLOOD: -Macrocytic anemia -Thrombocytopenia  COMMENT:  The overall findings are very concerning for involvement by a low-grade myelodysplastic syndrome.  The differential diagnosis includes changes related to nutritional deficiency, medication, immune mediated process, infection, etc. Correlation with cytogenetic and FISH studies is recommended.  MICROSCOPIC DESCRIPTION:  PERIPHERAL BLOOD SMEAR: The red blood cells display mild anisopoikilocytosis with mild polychromasia.  The white blood cells are normal in number with occasional hypogranular and/or hypolobated neutrophils.  Scattered reactive appearing lymphocytes are present.  The platelets are decreased in number.  BONE MARROW ASPIRATE: Bone marrow particles present. Erythroid precursors: Progressive maturation with occasional late precursors displaying nuclear cytoplasmic dyssynchrony. Granulocytic precursors: Progressive maturation with many mature neutrophils displaying hypogranulation and/or hypolobation.  No increase in blastic cells identified. Megakaryocytes: Abundant with many small and/or hypolobated/unilobated forms Lymphocytes/plasma cells: Large aggregates not present  TOUCH PREPARATIONS: A mixture of cell types present.  CLOT AND BIOPSY: The sections show variable cellularity ranging from 10% focally to 70% with a mixture of cell types.  Expansile sheets of blastic cells are not identified.  A small interstitial lymphoid aggregate composed of small lymphoid cells is seen.  IRON STAIN: Iron stains are  performed on a bone marrow aspirate or touch imprint smear and section of clot. The controls stained appropriately.       Storage Iron:  Abundant      Ring Sideroblasts: Absent  ADDITIONAL DATA/TESTING: The specimen was sent for cytogenetic analysis and FISH for MDS and a separate report will follow. Flow cytometric analysis (WLS [id number]) was performed and shows T cells with nonspecific changes.  No monoclonal B-cell population or significant CD34 positive blastic population identified.  # LATE AUG 2025- start rev 10 mg- 3 w/1 w     #Incidental right hepatic lobe 13 mm hypoechoic lesion [ultrasound-October 2022]-MRI OCT 2022-   Low clinical concern for metastatic disease; however need to rule out primary HCC versus others.  1. There are 3 small liver lesions, 2 of which represent simple cysts. 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 MAY 2023-abdominal MRI -benign cyst no further surveillance recommended.   #October 2022-ultrasound incidental bilateral mild to moderate hydronephrosis.-Rs/p evaluation with Dr.Brandon urology.     MDS (myelodysplastic syndrome), low grade (HCC)  04/20/2022 Initial Diagnosis   MDS (myelodysplastic syndrome), low grade (HCC)     ALLERGIES:  has no known allergies.  MEDICATIONS:  Current Outpatient Medications  Medication Sig Dispense Refill   albuterol  (VENTOLIN  HFA) 108 (90 Base) MCG/ACT inhaler Inhale 1-2 puffs into the lungs every 6 (six) hours as needed for wheezing or shortness of breath. 8.5 g 11   amLODipine  (NORVASC ) 5 MG tablet TAKE 1 TABLET BY MOUTH DAILY 100 tablet 2   amoxicillin -clavulanate (AUGMENTIN ) 875-125 MG tablet Take 1 tablet by mouth 2 (two) times daily for 10 days. 20 tablet 0   aspirin  EC 81 MG tablet Take 81 mg by mouth daily. Swallow whole. (Patient  not taking: Reported on 12/08/2023)     azithromycin  (ZITHROMAX ) 250 MG tablet Take 1 tablet (250 mg  total) by mouth 3 (three) times a week. M-W-F 12 each 3   benzonatate  (TESSALON ) 200 MG capsule Take 1 capsule (200 mg total) by mouth 3 (three) times daily as needed for cough. 60 capsule 2   Calcium  Citrate-Vitamin D  (CALCIUM  CITRATE + D PO) Take 1 tablet by mouth 2 (two) times daily.     citalopram  (CELEXA ) 20 MG tablet TAKE 1 TABLET BY MOUTH DAILY 100 tablet 2   famotidine  (PEPCID ) 20 MG tablet TAKE 1 TABLET BY MOUTH DAILY. BEFORE DINNER 30 tablet 11   ferrous sulfate  324 MG TBEC Take 2 tablets by mouth. 65 mg of elemental iron     fexofenadine (ALLEGRA) 180 MG tablet Take 180 mg by mouth daily.     Fluticasone -Umeclidin-Vilant (TRELEGY ELLIPTA ) 200-62.5-25 MCG/ACT AEPB Inhale 1 puff into the lungs daily. 60 each 6   Fluticasone -Umeclidin-Vilant (TRELEGY ELLIPTA ) 200-62.5-25 MCG/ACT AEPB Inhale 1 puff into the lungs daily. 28 each 0   Fluticasone -Umeclidin-Vilant (TRELEGY ELLIPTA ) 200-62.5-25 MCG/ACT AEPB Inhale 1 puff into the lungs daily in the afternoon. 2 each 0   gabapentin  (NEURONTIN ) 100 MG capsule TAKE 1 CAPSULE BY MOUTH 3 TIMES  DAILY 300 capsule 2   lenalidomide  (REVLIMID ) 10 MG capsule Take 1 capsule (10 mg total) by mouth daily. Take for 21 days, then hold for 7 days. Repeat every 28 days. 21 capsule 0   levothyroxine  (SYNTHROID ) 50 MCG tablet TAKE 1 TABLET BY MOUTH DAILY ON  AN EMPTY STOMACH WITH A GLASS OF WATER AT LEAST 30 TO 60 MINUTES  BEFORE BREAKFAST 100 tablet 2   ondansetron  (ZOFRAN ) 8 MG tablet One pill every 8 hours as needed for nausea/vomitting. (Patient not taking: Reported on 12/08/2023) 40 tablet 1   pantoprazole  (PROTONIX ) 40 MG tablet TAKE 1 TABLET BY MOUTH DAILY 100 tablet 2   rosuvastatin  (CRESTOR ) 10 MG tablet TAKE ONE TABLET BY MOUTH ONCE DAILY 100 tablet 1   solifenacin  (VESICARE ) 5 MG tablet Take 1 tablet (5 mg total) by mouth daily. 30 tablet 2   traZODone  (DESYREL ) 50 MG tablet TAKE 1/2 TO 1 TABLET BY MOUTH AT BEDTIME AS NEEDED FOR SLEEP 90 tablet 3    vitamin B-12 (CYANOCOBALAMIN ) 1000 MCG tablet Take 1,000 mcg by mouth daily.     No current facility-administered medications for this visit.    VITAL SIGNS: BP (!) 126/58 (BP Location: Left Arm, Patient Position: Sitting)   Pulse 81   Resp 20   SpO2 98%  Filed Weights    Estimated body mass index is 33.66 kg/m as calculated from the following:   Height as of 01/09/24: 5' 3 (1.6 m).   Weight as of 01/09/24: 190 lb (86.2 kg).  LABS: CBC:    Component Value Date/Time   WBC 6.1 01/12/2024 1038   WBC 5.1 01/09/2024 1607   HGB 9.5 (L) 01/12/2024 1038   HCT 28.7 (L) 01/12/2024 1038   PLT 126 (L) 01/12/2024 1038   MCV 112.1 (H) 01/12/2024 1038   NEUTROABS 4.5 01/12/2024 1038   LYMPHSABS 0.6 (L) 01/12/2024 1038   MONOABS 0.7 01/12/2024 1038   EOSABS 0.2 01/12/2024 1038   BASOSABS 0.1 01/12/2024 1038   Comprehensive Metabolic Panel:    Component Value Date/Time   NA 136 01/12/2024 1038   K 3.3 (L) 01/12/2024 1038   CL 101 01/12/2024 1038   CO2 26 01/12/2024 1038   BUN  8 01/12/2024 1038   CREATININE 0.77 01/12/2024 1038   CREATININE 0.89 08/06/2021 1612   GLUCOSE 119 (H) 01/12/2024 1038   CALCIUM  8.6 (L) 01/12/2024 1038   AST 12 (L) 01/12/2024 1038   ALT 13 01/12/2024 1038   ALKPHOS 60 01/12/2024 1038   BILITOT 1.4 (H) 01/12/2024 1038   PROT 7.2 01/12/2024 1038   ALBUMIN 3.9 01/12/2024 1038    RADIOGRAPHIC STUDIES: CT ABDOMEN PELVIS W CONTRAST Result Date: 01/09/2024 CLINICAL DATA:  Left lower quadrant pain. EXAM: CT ABDOMEN AND PELVIS WITH CONTRAST TECHNIQUE: Multidetector CT imaging of the abdomen and pelvis was performed using the standard protocol following bolus administration of intravenous contrast. RADIATION DOSE REDUCTION: This exam was performed according to the departmental dose-optimization program which includes automated exposure control, adjustment of the mA and/or kV according to patient size and/or use of iterative reconstruction technique. CONTRAST:   OMNIPAQUE  IOHEXOL  300 MG/ML  SOLN COMPARISON:  09/04/2023 FINDINGS: Lower Chest: No acute findings. Hepatobiliary: Stable small cyst in left hepatic lobe. No suspicious hepatic masses identified. Gallbladder is unremarkable. No evidence of biliary ductal dilatation. Pancreas:  No mass or inflammatory changes. Spleen: Within normal limits in size and appearance. Adrenals/Urinary Tract: No suspicious masses identified. No evidence of ureteral calculi or hydronephrosis. Unremarkable unopacified urinary bladder. Stomach/Bowel: Mild wall thickening is seen involving the rectosigmoid colon with adjacent soft tissue stranding, which is new since previous study. This is consistent with mild proctocolitis, although no definite diverticular disease is seen. No No evidence of perforation or abscess. Vascular/Lymphatic: No pathologically enlarged lymph nodes. No acute vascular findings. Reproductive:  No mass or other significant abnormality. Other:  None. Musculoskeletal:  No suspicious bone lesions identified. IMPRESSION: Mild proctocolitis involving the rectum and distal sigmoid colon. No evidence of abscess or other complication. Electronically Signed   By: Norleen DELENA Kil M.D.   On: 01/09/2024 18:32    PERFORMANCE STATUS (ECOG) : 2 - Symptomatic, <50% confined to bed  Review of Systems Unless otherwise noted, a complete review of systems is negative.  Physical Exam General: Frail appearing Cardiovascular: regular rate and rhythm Pulmonary: clear ant fields Abdomen: soft, tender left side, + bowel sounds GU: no suprapubic tenderness Extremities: no edema, no joint deformities Skin: no rashes Neurological: Weakness but otherwise nonfocal  IMPRESSION/PLAN: MDS -hold Revlimid  given current symptoms.  MD follow-up next week  Colitis -patient seen in emergency department earlier this week and was diagnosed with colitis.  Refused hospitalization.  Sent home on Augmentin .  She presents to clinic today with  worsening abdominal pain, nausea and vomiting, black tarry stools.  Patient with minimal oral intake.  Given worsening symptoms, recommended that she present back to the emergency department for further evaluation and management.  Patient also seen by Dr. Rennie.  Report called to ED triage RN.   Patient expressed understanding and was in agreement with this plan. She also understands that She can call clinic at any time with any questions, concerns, or complaints.   Thank you for allowing me to participate in the care of this very pleasant patient.   Time Total: 25 minutes  Visit consisted of counseling and education dealing with the complex and emotionally intense issues of symptom management in the setting of serious illness.Greater than 50%  of this time was spent counseling and coordinating care related to the above assessment and plan.  Signed by: Fonda Mower, PhD, NP-C

## 2024-01-13 DIAGNOSIS — K921 Melena: Secondary | ICD-10-CM | POA: Diagnosis not present

## 2024-01-13 DIAGNOSIS — K529 Noninfective gastroenteritis and colitis, unspecified: Secondary | ICD-10-CM | POA: Diagnosis not present

## 2024-01-13 LAB — CBC
HCT: 24.4 % — ABNORMAL LOW (ref 36.0–46.0)
Hemoglobin: 8 g/dL — ABNORMAL LOW (ref 12.0–15.0)
MCH: 37 pg — ABNORMAL HIGH (ref 26.0–34.0)
MCHC: 32.8 g/dL (ref 30.0–36.0)
MCV: 113 fL — ABNORMAL HIGH (ref 80.0–100.0)
Platelets: 110 K/uL — ABNORMAL LOW (ref 150–400)
RBC: 2.16 MIL/uL — ABNORMAL LOW (ref 3.87–5.11)
RDW: 13.8 % (ref 11.5–15.5)
WBC: 5.3 K/uL (ref 4.0–10.5)
nRBC: 0 % (ref 0.0–0.2)

## 2024-01-13 LAB — BASIC METABOLIC PANEL WITH GFR
Anion gap: 11 (ref 5–15)
BUN: 6 mg/dL — ABNORMAL LOW (ref 8–23)
CO2: 26 mmol/L (ref 22–32)
Calcium: 8.1 mg/dL — ABNORMAL LOW (ref 8.9–10.3)
Chloride: 102 mmol/L (ref 98–111)
Creatinine, Ser: 0.82 mg/dL (ref 0.44–1.00)
GFR, Estimated: >60 mL/min (ref >60)
Glucose, Bld: 128 mg/dL — ABNORMAL HIGH (ref 70–99)
Potassium: 3.2 mmol/L — ABNORMAL LOW (ref 3.5–5.1)
Sodium: 139 mmol/L (ref 135–145)

## 2024-01-13 LAB — MAGNESIUM: Magnesium: 1.8 mg/dL (ref 1.7–2.4)

## 2024-01-13 LAB — FERRITIN: Ferritin: 56 ng/mL (ref 11–307)

## 2024-01-13 LAB — IRON AND TIBC
Iron: 10 ug/dL — ABNORMAL LOW (ref 28–170)
Saturation Ratios: 4 % — ABNORMAL LOW (ref 10.4–31.8)
TIBC: 230 ug/dL — ABNORMAL LOW (ref 250–450)
UIBC: 220 ug/dL

## 2024-01-13 LAB — TSH: TSH: 1.669 u[IU]/mL (ref 0.350–4.500)

## 2024-01-13 LAB — VITAMIN B12: Vitamin B-12: >4000 pg/mL — ABNORMAL HIGH (ref 180–914)

## 2024-01-13 LAB — VITAMIN D 25 HYDROXY (VIT D DEFICIENCY, FRACTURES): Vit D, 25-Hydroxy: 30.86 ng/mL (ref 30–100)

## 2024-01-13 MED ORDER — GUAIFENESIN-DM 100-10 MG/5ML PO SYRP
5.0000 mL | ORAL_SOLUTION | ORAL | Status: DC | PRN
  Administered 2024-01-13 (×3): 5 mL via ORAL
  Filled 2024-01-13 (×3): qty 10

## 2024-01-13 MED ORDER — IRON SUCROSE 300 MG IVPB - SIMPLE MED
300.0000 mg | Freq: Once | Status: AC
  Administered 2024-01-13: 300 mg via INTRAVENOUS
  Filled 2024-01-13: qty 300

## 2024-01-13 MED ORDER — PANTOPRAZOLE SODIUM 40 MG IV SOLR
40.0000 mg | Freq: Two times a day (BID) | INTRAVENOUS | Status: DC
  Administered 2024-01-13 – 2024-01-22 (×19): 40 mg via INTRAVENOUS
  Filled 2024-01-13 (×19): qty 10

## 2024-01-13 MED ORDER — PEG 3350-KCL-NA BICARB-NACL 420 G PO SOLR: 2000.0000 mL | Freq: Once | ORAL | Status: DC | PRN

## 2024-01-13 MED ORDER — PEG 3350-KCL-NA BICARB-NACL 420 G PO SOLR
4000.0000 mL | Freq: Once | ORAL | Status: AC
Start: 2024-01-13 — End: 2024-01-13
  Administered 2024-01-13: 4000 mL via ORAL
  Filled 2024-01-13: qty 4000

## 2024-01-13 MED ORDER — SODIUM CHLORIDE 0.9 % IV SOLN
2.0000 g | Freq: Every day | INTRAVENOUS | Status: DC
  Filled 2024-01-13: qty 20

## 2024-01-13 MED ORDER — POTASSIUM CHLORIDE 20 MEQ PO PACK
40.0000 meq | PACK | Freq: Once | ORAL | Status: AC
  Administered 2024-01-13: 40 meq via ORAL
  Filled 2024-01-13: qty 2

## 2024-01-13 NOTE — Plan of Care (Signed)
  Problem: Education: Goal: Knowledge of General Education information will improve Description: Including pain rating scale, medication(s)/side effects and non-pharmacologic comfort measures Outcome: Progressing   Problem: Health Behavior/Discharge Planning: Goal: Ability to manage health-related needs will improve Outcome: Progressing   Problem: Clinical Measurements: Goal: Will remain free from infection Outcome: Progressing   Problem: Safety: Goal: Ability to remain free from injury will improve Outcome: Progressing   

## 2024-01-13 NOTE — Consult Note (Signed)
 Inpatient Consultation   Patient ID: Sarah [name] is a 73 y.o. female.  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  Dark stools and increased frequency started approxi-9 days prior to admission.  No hematochezia.  She denies any NSAID or Pepto-Bismol use.  She has had some lower left quadrant abdominal pain with this and nausea.  She was found to have proctocolitis on CT but infectious workup was negative.  She did receive Rocephin  and Flagyl  earlier this week during her ED visit but was then discharged home.  She returned to the emergency department for this hospitalization with worsening symptoms and found to have decreasing hemoglobin and iron  deficiency.  She feels her shortness of breath has been worsened with systemic chemotherapy but no requirement for supplemental oxygen.  Prior to her symptoms starting she had also restarted her systemic chemotherapy, but is now off again.  Recently started baby aspirin  daily Denies NSAIDs, other anti-plt agents, and anticoagulants Denies family history of gastrointestinal disease and malignancy Previous Endoscopies: November 2022 EGD and colonoscopy-gastric polyps and hiatal hernia otherwise normal upper endoscopy. 4 polyps removed including an adenomatous polyp over 2 cm in size.  Recommended 3-year follow-up   Past Medical History:  Diagnosis Date   Arthritis    Asthma    CAP (community acquired pneumonia) 08/11/2022   Depression    Dyspnea    with heat   Environmental and seasonal allergies    GERD (gastroesophageal reflux disease)     Hyperlipidemia    Hypertension    Hypothyroidism    Leukemia (HCC)    Presence of dental prosthetic device    Dental implants   Wheezing     Past Surgical History:  Procedure Laterality Date   ABDOMINAL HYSTERECTOMY  04/26/1987   ANTERIOR CERVICAL DECOMPRESSION/DISCECTOMY FUSION 4 LEVELS N/A 11/10/2021   Procedure: C3-7 ANTERIOR CERVICAL DISCECTOMY AND FUSION (GLOBUS FORGE);  Surgeon: Clois Fret, MD;  Location: [facility];  Service: Neurosurgery;  Laterality: N/A;   BROW LIFT Bilateral 02/07/2020   Procedure: BLEPHAROPLASTY UPPER EYELID; W/EXCESS SKIN BROW PTOSIS REPAIR BILATERAL;  Surgeon: Ashley Greig HERO, MD;  Location: [facility];  Service: Ophthalmology;  Laterality: Bilateral;   CATARACT EXTRACTION W/PHACO Right 09/13/2016   Procedure: CATARACT EXTRACTION PHACO AND INTRAOCULAR LENS PLACEMENT (IOC);  Surgeon: Jaye Fallow, MD;  Location: [facility];  Service: Ophthalmology;  Laterality: Right;  US  00:38 AP% 14.3 CDE 5.51 Fluid pack lot # [id number]   CATARACT EXTRACTION W/PHACO Left 10/11/2016   Procedure: CATARACT EXTRACTION PHACO AND INTRAOCULAR LENS PLACEMENT (IOC);  Surgeon: Jaye Fallow, MD;  Location: [facility];  Service: Ophthalmology;  Laterality: Left;  US  00:30 AP% 11.3 CDE 3.50 fluid pack lot # [id number]   COLONOSCOPY WITH PROPOFOL  N/A 09/30/2014   Procedure: COLONOSCOPY WITH PROPOFOL ;  Surgeon: Reyes LELON Cota, MD;  Location: [facility];  Service: Endoscopy;  Laterality: N/A;   COLONOSCOPY WITH PROPOFOL  N/A 03/24/2021   Procedure: COLONOSCOPY WITH PROPOFOL ;  Surgeon: Unk Corinn Skiff, MD;  Location: [facility];  Service: Gastroenterology;  Laterality: N/A;   ESOPHAGOGASTRODUODENOSCOPY N/A 09/30/2014   Procedure: ESOPHAGOGASTRODUODENOSCOPY (EGD);  Surgeon: Reyes LELON Cota,  MD;  Location: [facility];  Service: Endoscopy;  Laterality: N/A;   ESOPHAGOGASTRODUODENOSCOPY N/A 03/24/2021   Procedure: ESOPHAGOGASTRODUODENOSCOPY (EGD);   Surgeon: Unk Corinn Skiff, MD;  Location: [facility] END[facility];  Service: Gastroenterology;  Laterality: N/A;   FOOT SURGERY Bilateral 04/26/1983   KNEE SURGERY  04/25/2014    No Known Allergies  Family History  Problem Relation Age of Onset   Diabetes Mother    Hyperlipidemia Mother    Heart disease Mother    Prostate cancer Neg Hx    Kidney cancer Neg Hx    Bladder Cancer Neg Hx     Social History   Tobacco Use   Smoking status: Never   Smokeless tobacco: Never  Vaping Use   Vaping status: Never Used  Substance Use Topics   Alcohol use: No   Drug use: No     Pertinent GI related history and allergies were reviewed with the patient  Review of Systems  Constitutional:  Positive for appetite change. Negative for activity change, chills, diaphoresis, fatigue, fever and unexpected weight change.  HENT:  Negative for trouble swallowing and voice change.   Respiratory:  Positive for shortness of breath. Negative for wheezing.   Cardiovascular:  Negative for chest pain, palpitations and leg swelling.  Gastrointestinal:  Positive for abdominal pain, blood in stool (melena), diarrhea and nausea. Negative for abdominal distention, anal bleeding, constipation, rectal pain and vomiting.  Musculoskeletal:  Negative for arthralgias and myalgias.  Skin:  Negative for color change and pallor.  Neurological:  Positive for weakness. Negative for dizziness and syncope.  Psychiatric/Behavioral:  Negative for confusion.   All other systems reviewed and are negative.    Medications Home Medications No current facility-administered medications on file prior to encounter.   Current Outpatient Medications on File Prior to Encounter  Medication Sig Dispense Refill   albuterol  (VENTOLIN  HFA) 108 (90 Base) MCG/ACT inhaler Inhale 1-2 puffs into the lungs every 6 (six) hours as needed for wheezing or shortness of breath. 8.5 g 11   amLODipine  (NORVASC ) 5 MG tablet TAKE 1 TABLET BY MOUTH DAILY 100  tablet 2   aspirin  EC 81 MG tablet Take 81 mg by mouth daily. Swallow whole.     azithromycin  (ZITHROMAX ) 250 MG tablet Take 1 tablet (250 mg total) by mouth 3 (three) times a week. M-W-F 12 each 3   Calcium  Citrate-Vitamin D  (CALCIUM  CITRATE + D PO) Take 1 tablet by mouth 2 (two) times daily.     citalopram  (CELEXA ) 20 MG tablet TAKE 1 TABLET BY MOUTH DAILY 100 tablet 2   famotidine  (PEPCID ) 20 MG tablet TAKE 1 TABLET BY MOUTH DAILY. BEFORE DINNER 30 tablet 11   ferrous sulfate  324 MG TBEC Take 2 tablets by mouth. 65 mg of elemental iron      fexofenadine (ALLEGRA) 180 MG tablet Take 180 mg by mouth daily.     gabapentin  (NEURONTIN ) 100 MG capsule TAKE 1 CAPSULE BY MOUTH 3 TIMES  DAILY 300 capsule 2   lenalidomide  (REVLIMID ) 10 MG capsule Take 1 capsule (10 mg total) by mouth daily. Take for 21 days, then hold for 7 days. Repeat every 28 days. 21 capsule 0   levothyroxine  (SYNTHROID ) 50 MCG tablet TAKE 1 TABLET BY MOUTH DAILY ON  AN EMPTY STOMACH WITH A GLASS OF WATER  AT LEAST 30 TO 60 MINUTES  BEFORE BREAKFAST 100 tablet 2   pantoprazole  (PROTONIX ) 40 MG tablet TAKE 1 TABLET BY MOUTH DAILY 100 tablet 2   rosuvastatin  (CRESTOR ) 10 MG  tablet TAKE ONE TABLET BY MOUTH ONCE DAILY 100 tablet 1   solifenacin  (VESICARE ) 5 MG tablet Take 1 tablet (5 mg total) by mouth daily. 30 tablet 2   traZODone  (DESYREL ) 50 MG tablet TAKE 1/2 TO 1 TABLET BY MOUTH AT BEDTIME AS NEEDED FOR SLEEP 90 tablet 3   vitamin B-12 (CYANOCOBALAMIN ) 1000 MCG tablet Take 1,000 mcg by mouth daily.     Fluticasone -Umeclidin-Vilant (TRELEGY ELLIPTA ) 200-62.5-25 MCG/ACT AEPB Inhale 1 puff into the lungs daily. 28 each 0   ondansetron  (ZOFRAN ) 8 MG tablet One pill every 8 hours as needed for nausea/vomitting. (Patient not taking: No sig reported) 40 tablet 1   Pertinent GI related medications were reviewed with the patient  Inpatient Medications  Current Facility-Administered Medications:    acetaminophen  (TYLENOL ) tablet 650 mg,  650 mg, Oral, Q6H PRN **OR** acetaminophen  (TYLENOL ) suppository 650 mg, 650 mg, Rectal, Q6H PRN, Franchot Novel, MD   albuterol  (PROVENTIL ) (2.5 MG/3ML) 0.083% nebulizer solution 2.5 mg, 2.5 mg, Nebulization, Q4H PRN, Franchot Novel, MD   amLODipine  (NORVASC ) tablet 5 mg, 5 mg, Oral, Daily, Franchot Novel, MD, 5 mg at 01/13/24 1220   budesonide -glycopyrrolate -formoterol  (BREZTRI ) 160-9-4.8 MCG/ACT inhaler 2 puff, 2 puff, Inhalation, BID, Franchot Novel, MD, 2 puff at 01/13/24 1034   citalopram  (CELEXA ) tablet 20 mg, 20 mg, Oral, Daily, Franchot Novel, MD, 20 mg at 01/13/24 1220   dextrose  5 % and 0.45 % NaCl infusion, , Intravenous, Continuous, Franchot Novel, MD, Last Rate: 75 mL/hr at 01/13/24 1209, Restarted at 01/13/24 1209   enoxaparin  (LOVENOX ) injection 40 mg, 40 mg, Subcutaneous, Q24H, Franchot Novel, MD   famotidine  (PEPCID ) tablet 20 mg, 20 mg, Oral, Daily, Franchot Novel, MD, 20 mg at 01/12/24 1811   feeding supplement (BOOST / RESOURCE BREEZE) liquid 1 Container, 1 Container, Oral, TID BM, Franchot Novel, MD   gabapentin  (NEURONTIN ) capsule 100 mg, 100 mg, Oral, TID, Franchot Novel, MD, 100 mg at 01/13/24 1219   guaiFENesin -dextromethorphan  (ROBITUSSIN DM) 100-10 MG/5ML syrup 5 mL, 5 mL, Oral, Q4H PRN, Franchot Novel, MD, 5 mL at 01/13/24 0551   HYDROmorphone  (DILAUDID ) injection 0.5 mg, 0.5 mg, Intravenous, Q3H PRN, Franchot Novel, MD, 0.5 mg at 01/13/24 1029   levothyroxine  (SYNTHROID ) tablet 50 mcg, 50 mcg, Oral, Q0600, Franchot Novel, MD, 50 mcg at 01/13/24 0551   loratadine  (CLARITIN ) tablet 10 mg, 10 mg, Oral, Daily, Franchot Novel, MD, 10 mg at 01/13/24 1220   multivitamin with minerals tablet 1 tablet, 1 tablet, Oral, Daily, Franchot Novel, MD, 1 tablet at 01/13/24 1219   ondansetron  (ZOFRAN ) tablet 4 mg, 4 mg, Oral, Q6H PRN **OR** ondansetron  (ZOFRAN ) injection 4 mg, 4 mg, Intravenous, Q6H PRN, Franchot Novel, MD, 4 mg at 01/13/24  1017   pantoprazole  (PROTONIX ) injection 40 mg, 40 mg, Intravenous, Q12H, Franchot Novel, MD, 40 mg at 01/13/24 1017   traZODone  (DESYREL ) tablet 25 mg, 25 mg, Oral, QHS PRN, Franchot Novel, MD, 25 mg at 01/12/24 2233  dextrose  5 % and 0.45 % NaCl 75 mL/hr at 01/13/24 1209    acetaminophen  **OR** acetaminophen , albuterol , guaiFENesin -dextromethorphan , HYDROmorphone  (DILAUDID ) injection, ondansetron  **OR** ondansetron  (ZOFRAN ) IV, traZODone    Objective   Vitals:   01/12/24 1835 01/12/24 1956 01/13/24 0435 01/13/24 0744  BP: (!) 134/57 126/60 (!) 122/57 (!) 119/51  Pulse: 77 80 72 77  Resp: 18 20 16 18   Temp: 98.5 F (36.9 C) 98.4 F (36.9 C) 98.9 F (37.2 C) 99 F (37.2 C)  TempSrc: Oral     SpO2: 97%  93% 98% 94%  Weight:      Height:         Physical Exam Vitals and nursing note reviewed.  Constitutional:      General: She is not in acute distress.    Appearance: She is ill-appearing. She is not toxic-appearing or diaphoretic.  HENT:     Head: Normocephalic and atraumatic.     Nose: Nose normal.     Mouth/Throat:     Mouth: Mucous membranes are moist.     Pharynx: Oropharynx is clear.  Eyes:     General: No scleral icterus.    Extraocular Movements: Extraocular movements intact.  Cardiovascular:     Rate and Rhythm: Normal rate and regular rhythm.  Pulmonary:     Effort: Pulmonary effort is normal. No respiratory distress.     Breath sounds: Wheezing (mild) present. No rhonchi or rales.  Abdominal:     General: Bowel sounds are normal. There is no distension.     Palpations: Abdomen is soft.     Tenderness: There is abdominal tenderness (LLQ). There is no guarding or rebound.     Comments: No rigidity, non peritoneal  Musculoskeletal:     Cervical back: Neck supple.  Skin:    General: Skin is warm and dry.     Coloration: Skin is not jaundiced or pale.  Neurological:     General: No focal deficit present.     Mental Status: She is alert and oriented to  person, place, and time. Mental status is at baseline.  Psychiatric:        Mood and Affect: Mood normal.        Behavior: Behavior normal.        Thought Content: Thought content normal.        Judgment: Judgment normal.     Laboratory Data Recent Labs  Lab 01/12/24 1038 01/12/24 1229 01/13/24 0626  WBC 6.1 6.0 5.3  HGB 9.5* 9.6* 8.0*  HCT 28.7* 29.8* 24.4*  PLT 126* 129* 110*   Recent Labs  Lab 01/09/24 1607 01/12/24 1038 01/12/24 1430 01/13/24 0626  NA 140 136 142 139  K 3.8 3.3* 3.5 3.2*  CL 107 101 104 102  CO2 23 26 27 26   BUN 10 8 7* 6*  CALCIUM  9.2 8.6* 8.8* 8.1*  PROT 7.2 7.2 7.1  --   BILITOT 1.3* 1.4* 1.2  --   ALKPHOS 61 60 54  --   ALT 15 13 12   --   AST 10* 12* 13*  --   GLUCOSE 98 119* 100* 128*   No results for input(s): INR in the last 168 hours.  No results for input(s): LIPASE in the last 72 hours.      Imaging Studies: US  Venous Img Lower Bilateral (DVT) Result Date: 01/13/2024 CLINICAL DATA:  73 year old female with lower extremity edema. EXAM: BILATERAL LOWER EXTREMITY VENOUS DOPPLER ULTRASOUND TECHNIQUE: Gray-scale sonography with compression, as well as color and duplex ultrasound, were performed to evaluate the deep venous system(s) from the level of the common femoral vein through the popliteal and proximal calf veins. COMPARISON:  CT Abdomen and Pelvis with contrast 01/09/2024. FINDINGS: VENOUS Normal compressibility of the bilateral common femoral, superficial femoral, and popliteal veins, as well as the visualized calf veins. Visualized portions of the bilateral profunda femoral vein and great saphenous vein unremarkable. No filling defects to suggest DVT on grayscale or color Doppler imaging. Doppler waveforms show normal direction of venous flow, normal respiratory plasticity and response  to augmentation. OTHER None. Limitations: none IMPRESSION: No evidence of bilateral lower extremity deep venous thrombosis. Electronically Signed    By: VEAR Hurst M.D.   On: 01/13/2024 03:58   DG Chest Portable 1 View Result Date: 01/12/2024 CLINICAL DATA:  SOB, abd pain EXAM: DG CHEST 1V PORT COMPARISON:  September 26, 2023 FINDINGS: No focal airspace consolidation, pleural effusion, or pneumothorax. Moderate cardiomegaly. Aortic atherosclerosis. No acute fracture or destructive lesions. Multilevel thoracic osteophytosis. Cervical fusion hardware. IMPRESSION: Cardiomegaly.  No pneumonia or pulmonary edema. Electronically Signed   By: Rogelia Myers M.D.   On: 01/12/2024 14:54    Assessment:   # Melena  # acute on chronic anemia  # acute diarrhea  # LLQ pain  # proctitis on imaging  # gerd  # MDS  # Asthma  # phx colon polyps  # s/p hysterectomy  Plan:   Esophagogastroduodenoscopy and Colonoscopy planned for tomorrow pending patient stability and endoscopy suite availability NPO at midnight. Go lytely ordered. Ok to drink after midnight npo order. Second gallon ordered prn if stools not clear after first Clear liquids now Labs in am- bmp, cbc Protonix  40 mg iv q12 h Hold dvt ppx Infectious w/u negative Chemotherapy currently being held  Monitor H&H.  Transfusion and resuscitation as per primary team Avoid frequent lab draws to prevent lab induced anemia Supportive care and antiemetics as per primary team Maintain two sites IV access Avoid nsaids Monitor for GIB.  Esophagogastroduodenoscopy and Colonoscopy with possible biopsy, control of bleeding, polypectomy, and interventions as necessary has been discussed with the patient/patient representative. Informed consent was obtained from the patient/patient representative after explaining the indication, nature, and risks of the procedure including but not limited to death, bleeding, perforation, missed neoplasm/lesions, cardiorespiratory compromise, and reaction to medications. Opportunity for questions was given and appropriate answers were provided. Patient/patient  representative has verbalized understanding is amenable to undergoing the procedure.    Management of other medical comorbidities as per primary team  I personally performed the service.  Thank you for allowing us  to participate in this patient's care. Please don't hesitate to call if any questions or concerns arise.   Elspeth Ozell Jungling, DO [facility]  Portions of the record may have been created with voice recognition software. Occasional wrong-word or 'sound-a-like' substitutions may have occurred due to the inherent limitations of voice recognition software.  Read the chart carefully and recognize, using context, where substitutions may have occurred.

## 2024-01-13 NOTE — Progress Notes (Addendum)
 Progress Note   Patient: Sarah [name] FMW:[id number] DOB: [birth year] DOA: 01/12/2024     1 DOS: the patient was seen and examined on 01/13/2024   Brief hospital course:  HPI: ATLEE VILLERS is a 73 y.o. female with medical history significant of MDS, Asthma/bronchiectasis, GERD, HTN, Hypothyroidism, and HTN.     Patient states that her symptoms first began about 9 days prior to admission.  At that time she was having frequent bowel movements.  She did not note any blood in her stools.  Around 6 days prior to admission she started having small dark stools with mucus which she describes as dark tarry stools.  She continued to have these bowel movements since that time. And states her last small bowel movement was early on the day of admission.  She also has some associated left lower quadrant abdominal pain, poor appetite, and some intermittent nausea.  She presented to the ED 3d prior to today's admission and a CT scan of the abdomen pelvis revealed proctocolitis.  She received 1 dose of IV ceftriaxone  and Flagyl  and was discharged on Augmentin  as patient did not want to be admitted to the hospital.     Since her ED visit, she states that she continued to have dark stools, worsening left lower quadrant abdominal pain, nausea, and anorexia.  She did have an episode of emesis after taking her oral chemo medication Revlimid .  She had no hematemesis.  She denied fever but had some mild chills.   She saw her oncologist this a.m. and because of her worsening symptoms it was recommended that she come to the hospital for further evaluation.   Patient also notes chronic dyspnea that has worsened since being on her chemotherapeutic and worsening lower extremity edema, no chest pain, no difficulty urinating.   Assessment and Plan: GIB, melena  Unclear source. Hgb decreased this AM however she remains HDS. GI consulted.  Plan for EGD/Colonoscopy in the AM  IV PPI BID, two large bore Ivs Given  hemodynamic stability will continue daily CBC to monitor hgb   Proctocolitis  Noted on imaging.  Patient had stool studies completed at previous ED visit 9/16.  She received antibiotics.  Will discontinue those given negative stool studies.  MDS She recently resumed Revlimid  and had side effects of shortness of breath and lower extremity edema.  Her oncologist has held this. -Continue to monitor blood counts    Asthma  Bronchiectasis  Continue azithromycin , trelegy, antihistmaine and Nebs as needed.   GERD Continue PPI per above for now  Class I obesity  BMI 33 complicates care   HTN Continue amlodipine      Subjective: Continues to have some abdominal pain.  Noted that she is having some nausea this a.m. she denies any episodes of emesis overnight.  She did have a small dark bowel movement.  Physical Exam: Vitals:   01/12/24 1956 01/13/24 0435 01/13/24 0744 01/13/24 1641  BP: 126/60 (!) 122/57 (!) 119/51 (!) 116/55  Pulse: 80 72 77 76  Resp: 20 16 18 18   Temp: 98.4 F (36.9 C) 98.9 F (37.2 C) 99 F (37.2 C) 98.4 F (36.9 C)  TempSrc:      SpO2: 93% 98% 94% (!) 87%  Weight:      Height:       Physical Exam  Constitutional: In no distress.  Cardiovascular: Normal rate, regular rhythm. No lower extremity edema  Pulmonary: Non labored breathing on room air, Mild wheezing Abdominal: Soft. Non distended,  mildly tender to palpation left lower quadrant Musculoskeletal: Normal range of motion.     Neurological: Alert and oriented to person, place, and time. Non focal  Skin: Skin is warm and dry.   Data Reviewed:  US  Venous Img Lower Bilateral (DVT) Result Date: 01/13/2024 CLINICAL DATA:  73 year old female with lower extremity edema. EXAM: BILATERAL LOWER EXTREMITY VENOUS DOPPLER ULTRASOUND TECHNIQUE: Gray-scale sonography with compression, as well as color and duplex ultrasound, were performed to evaluate the deep venous system(s) from the level of the common femoral  vein through the popliteal and proximal calf veins. COMPARISON:  CT Abdomen and Pelvis with contrast 01/09/2024. FINDINGS: VENOUS Normal compressibility of the bilateral common femoral, superficial femoral, and popliteal veins, as well as the visualized calf veins. Visualized portions of the bilateral profunda femoral vein and great saphenous vein unremarkable. No filling defects to suggest DVT on grayscale or color Doppler imaging. Doppler waveforms show normal direction of venous flow, normal respiratory plasticity and response to augmentation. OTHER None. Limitations: none IMPRESSION: No evidence of bilateral lower extremity deep venous thrombosis. Electronically Signed   By: VEAR Hurst M.D.   On: 01/13/2024 03:58   DG Chest Portable 1 View Result Date: 01/12/2024 CLINICAL DATA:  SOB, abd pain EXAM: DG CHEST 1V PORT COMPARISON:  September 26, 2023 FINDINGS: No focal airspace consolidation, pleural effusion, or pneumothorax. Moderate cardiomegaly. Aortic atherosclerosis. No acute fracture or destructive lesions. Multilevel thoracic osteophytosis. Cervical fusion hardware. IMPRESSION: Cardiomegaly.  No pneumonia or pulmonary edema. Electronically Signed   By: Rogelia Myers M.D.   On: 01/12/2024 14:54   CT A/P 9/16  Mild proctocolitis involving the rectum and distal sigmoid colon. No evidence of abscess or other complication.  GIPP negative   Family Communication: None at bedside   Disposition: Status is: Inpatient Remains inpatient appropriate because: Will need colonoscopy and upper endoscopy  Planned Discharge Destination: Home    Time spent: 35 minutes  Author: Alban Pepper, MD 01/13/2024 5:17 PM  For on call review [email or web address].

## 2024-01-14 ENCOUNTER — Inpatient Hospital Stay

## 2024-01-14 ENCOUNTER — Encounter
Admission: EM | Disposition: A | Discharge status: Home Health | Payer: Self-pay | Source: Ambulatory Visit | Attending: Internal Medicine

## 2024-01-14 DIAGNOSIS — K529 Noninfective gastroenteritis and colitis, unspecified: Secondary | ICD-10-CM | POA: Diagnosis not present

## 2024-01-14 DIAGNOSIS — K921 Melena: Secondary | ICD-10-CM | POA: Diagnosis not present

## 2024-01-14 LAB — CBC
HCT: 25.8 % — ABNORMAL LOW (ref 36.0–46.0)
Hemoglobin: 8.4 g/dL — ABNORMAL LOW (ref 12.0–15.0)
MCH: 36.7 pg — ABNORMAL HIGH (ref 26.0–34.0)
MCHC: 32.6 g/dL (ref 30.0–36.0)
MCV: 112.7 fL — ABNORMAL HIGH (ref 80.0–100.0)
Platelets: 126 K/uL — ABNORMAL LOW (ref 150–400)
RBC: 2.29 MIL/uL — ABNORMAL LOW (ref 3.87–5.11)
RDW: 14.4 % (ref 11.5–15.5)
WBC: 6.9 K/uL (ref 4.0–10.5)
nRBC: 0 % (ref 0.0–0.2)

## 2024-01-14 LAB — BASIC METABOLIC PANEL WITH GFR
Anion gap: 8 (ref 5–15)
BUN: 8 mg/dL (ref 8–23)
CO2: 27 mmol/L (ref 22–32)
Calcium: 8.2 mg/dL — ABNORMAL LOW (ref 8.9–10.3)
Chloride: 101 mmol/L (ref 98–111)
Creatinine, Ser: 0.74 mg/dL (ref 0.44–1.00)
GFR, Estimated: >60 mL/min (ref >60)
Glucose, Bld: 145 mg/dL — ABNORMAL HIGH (ref 70–99)
Potassium: 3.2 mmol/L — ABNORMAL LOW (ref 3.5–5.1)
Sodium: 136 mmol/L (ref 135–145)

## 2024-01-14 SURGERY — EGD (ESOPHAGOGASTRODUODENOSCOPY)
Anesthesia: General

## 2024-01-14 MED ORDER — POTASSIUM CHLORIDE 20 MEQ PO PACK
40.0000 meq | PACK | ORAL | Status: AC
  Administered 2024-01-14: 40 meq via ORAL
  Filled 2024-01-14 (×2): qty 2

## 2024-01-14 MED ORDER — POLYETHYLENE GLYCOL 3350 17 GM/SCOOP PO POWD
238.0000 g | Freq: Once | ORAL | Status: AC
Start: 2024-01-14 — End: 2024-01-14
  Administered 2024-01-14: 238 g via ORAL
  Filled 2024-01-14: qty 238

## 2024-01-14 MED ORDER — POTASSIUM CHLORIDE 10 MEQ/100ML IV SOLN: 10.0000 meq | INTRAVENOUS | Status: DC

## 2024-01-14 MED ORDER — POLYETHYLENE GLYCOL 3350 17 GM/SCOOP PO POWD: 238.0000 g | Freq: Once | ORAL | Status: DC | PRN

## 2024-01-14 MED ORDER — SODIUM CHLORIDE 0.9 % IV SOLN
12.5000 mg | Freq: Four times a day (QID) | INTRAVENOUS | Status: DC | PRN
  Administered 2024-01-14: 12.5 mg via INTRAVENOUS
  Filled 2024-01-14 (×2): qty 0.5

## 2024-01-14 MED ORDER — IRON SUCROSE 300 MG IVPB - SIMPLE MED
300.0000 mg | Freq: Once | Status: AC
  Administered 2024-01-14: 300 mg via INTRAVENOUS
  Filled 2024-01-14: qty 300

## 2024-01-14 NOTE — Progress Notes (Signed)
 Progress Note   Patient: Sarah [name] FMW:[id number] DOB: [birth year] DOA: 01/12/2024     2 DOS: the patient was seen and examined on 01/14/2024   Brief hospital course:  HPI: Sarah [name] is a 73 y.o. female with medical history significant of MDS, Asthma/bronchiectasis, GERD, HTN, Hypothyroidism, and HTN.     Patient states that her symptoms first began about 9 days prior to admission.  At that time she was having frequent bowel movements.  She did not note any blood in her stools.  Around 6 days prior to admission she started having small dark stools with mucus which she describes as dark tarry stools.  She continued to have these bowel movements since that time. And states her last small bowel movement was early on the day of admission.  She also has some associated left lower quadrant abdominal pain, poor appetite, and some intermittent nausea.  She presented to the ED 3d prior to today's admission and a CT scan of the abdomen pelvis revealed proctocolitis.  She received 1 dose of IV ceftriaxone  and Flagyl  and was discharged on Augmentin  as patient did not want to be admitted to the hospital.     Since her ED visit, she states that she continued to have dark stools, worsening left lower quadrant abdominal pain, nausea, and anorexia.  She did have an episode of emesis after taking her oral chemo medication Revlimid .  She had no hematemesis.  She denied fever but had some mild chills.   She saw her oncologist this a.m. and because of her worsening symptoms it was recommended that she come to the hospital for further evaluation.   Patient also notes chronic dyspnea that has worsened since being on her chemotherapeutic and worsening lower extremity edema, no chest pain, no difficulty urinating.   Assessment and Plan: GIB, melena  Unclear source. Hgb stable this a.m. 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  Patient had stool studies completed at previous ED visit 9/16.  She received antibiotics.  Will discontinue those given negative stool studies.  Macrocytic anemia  Thrombocytopenia  In the setting of MDS.  Her platelets and hemoglobin are stable.  Patients worsening anemia likely in the setting of GI bleed.  Her platelet counts have been stably low.  Noted to have iron  deficiency this hospitalization. - Continue with IV iron  while inpatient   MDS She recently resumed Revlimid  and had side effects of shortness of breath and lower extremity edema.  Her oncologist has held this. -Continue to monitor blood counts    Asthma  Bronchiectasis  Continue azithromycin , trelegy, antihistmaine and Nebs as needed.   GERD Continue PPI per above for now  Class I obesity  BMI 33 complicates care   HTN Continue amlodipine      Subjective: Patient has some nausea overnight.  She vomited her prep up.  Physical Exam: Vitals:   01/13/24 2010 01/14/24 0422 01/14/24 0832 01/14/24 1237  BP: 119/62 (!) 145/60 130/75   Pulse: 72 79 89   Resp: 18 18 18    Temp: 97.6 F (36.4 C) (!) 97.4 F (36.3 C) 98.2 F (36.8 C)   TempSrc:   Oral   SpO2: 94% 90% 95% 93%  Weight:      Height:       Physical Exam  Constitutional: Mildly uncomfortable  Cardiovascular: Normal rate, regular  rhythm. 1+ edema of RLE, trace LLE  Pulmonary: Non labored breathing on [place], mild diffuse wheezing no rales Abdominal: [employer].at distended, non tender Musculoskeletal: Normal range of motion.     Neurological: Alert and oriented to person, place, and time. Non focal  Skin: Skin is warm and dry.    Data Reviewed:  US  Venous Img Lower Bilateral (DVT) Result Date: 01/13/2024 CLINICAL DATA:  73 year old female with lower extremity edema. EXAM: BILATERAL LOWER EXTREMITY VENOUS DOPPLER ULTRASOUND TECHNIQUE: Gray-scale sonography with compression, as well as color and duplex  ultrasound, were performed to evaluate the deep venous system(s) from the level of the common femoral vein through the popliteal and proximal calf veins. COMPARISON:  CT Abdomen and Pelvis with contrast 01/09/2024. FINDINGS: VENOUS Normal compressibility of the bilateral common femoral, superficial femoral, and popliteal veins, as well as the visualized calf veins. Visualized portions of the bilateral profunda femoral vein and great saphenous vein unremarkable. No filling defects to suggest DVT on grayscale or color Doppler imaging. Doppler waveforms show normal direction of venous flow, normal respiratory plasticity and response to augmentation. OTHER None. Limitations: none IMPRESSION: No evidence of bilateral lower extremity deep venous thrombosis. Electronically Signed   By: VEAR Hurst M.D.   On: 01/13/2024 03:58   DG Chest Portable 1 View Result Date: 01/12/2024 CLINICAL DATA:  SOB, abd pain EXAM: DG CHEST 1V PORT COMPARISON:  September 26, 2023 FINDINGS: No focal airspace consolidation, pleural effusion, or pneumothorax. Moderate cardiomegaly. Aortic atherosclerosis. No acute fracture or destructive lesions. Multilevel thoracic osteophytosis. Cervical fusion hardware. IMPRESSION: Cardiomegaly.  No pneumonia or pulmonary edema. Electronically Signed   By: Rogelia Myers M.D.   On: 01/12/2024 14:54   CT A/P 9/16  Mild proctocolitis involving the rectum and distal sigmoid colon. No evidence of abscess or other complication.  GIPP negative   Family Communication: None at bedside   Disposition: Status is: Inpatient Remains inpatient appropriate because: Will need colonoscopy and upper endoscopy  Planned Discharge Destination: Home    Time spent: 35 minutes  Author: Alban Pepper, MD 01/14/2024 3:11 PM  For on call review [email or web address].

## 2024-01-14 NOTE — Plan of Care (Signed)
  Problem: Clinical Measurements: Goal: Ability to maintain clinical measurements within normal limits will improve Outcome: Progressing   Problem: Activity: Goal: Risk for activity intolerance will decrease Outcome: Progressing   Problem: Pain Managment: Goal: General experience of comfort will improve and/or be controlled Outcome: Progressing   Problem: Safety: Goal: Ability to remain free from injury will improve Outcome: Progressing

## 2024-01-14 NOTE — Progress Notes (Signed)
 Pt alert and oriented; N/V, stated she's sick. MD notified, told to hold off med for an hour and resume before midnight. Pt  attempted to drink more after Zofran  given.  Pt had bm x1 was small foam  greenish. No loose stools. Pt worried not to hold off on colonoscopy today. Pt still trying to drink, vomited  at 0200. Pt straining trying to have bm, no loose stool, still greenish mucus.

## 2024-01-14 NOTE — Progress Notes (Signed)
 Inpatient Follow-up/Progress Note   Patient ID: Sarah [name] is a 73 y.o. female.  Overnight Events / Subjective Findings Unfortunately patient not able to tolerate prep overnight.  Procedure will be postponed till tomorrow.  Her friend is again at bedside.  The patient was very sleepy today given that she was up trying to take the colonoscopy prep. She is easy to arouse and does report she still has lower abdominal pain.  Initial stool after starting prep was brown.  No melena or hematochezia.  Hemoglobin stable today in comparison to previous  Review of Systems  Constitutional:  Negative for activity change, appetite change, chills, diaphoresis, fatigue, fever and unexpected weight change.  HENT:  Negative for trouble swallowing and voice change.   Respiratory:  Negative for shortness of breath and wheezing.   Cardiovascular:  Negative for chest pain, palpitations and leg swelling.  Gastrointestinal:  Positive for abdominal pain. Negative for abdominal distention, anal bleeding, blood in stool, constipation, diarrhea, nausea, rectal pain and vomiting.  Musculoskeletal:  Negative for arthralgias and myalgias.  Skin:  Negative for color change and pallor.  Neurological:  Negative for dizziness, syncope and weakness.  Psychiatric/Behavioral:  Negative for confusion.   All other systems reviewed and are negative.    Medications  Current Facility-Administered Medications:    acetaminophen  (TYLENOL ) tablet 650 mg, 650 mg, Oral, Q6H PRN **OR** acetaminophen  (TYLENOL ) suppository 650 mg, 650 mg, Rectal, Q6H PRN, Franchot Novel, MD   albuterol  (PROVENTIL ) (2.5 MG/3ML) 0.083% nebulizer solution 2.5 mg, 2.5 mg, Nebulization, Q4H PRN, Franchot Novel, MD   amLODipine  (NORVASC ) tablet 5 mg, 5 mg, Oral, Daily, Franchot Novel, MD, 5 mg at 01/13/24 1220   budesonide -glycopyrrolate -formoterol  (BREZTRI ) 160-9-4.8 MCG/ACT inhaler 2 puff, 2 puff, Inhalation, BID, Franchot Novel, MD, 2 puff  at 01/13/24 2207   citalopram  (CELEXA ) tablet 20 mg, 20 mg, Oral, Daily, Franchot Novel, MD, 20 mg at 01/13/24 1220   famotidine  (PEPCID ) tablet 20 mg, 20 mg, Oral, Daily, Franchot Novel, MD, 20 mg at 01/13/24 1807   feeding supplement (BOOST / RESOURCE BREEZE) liquid 1 Container, 1 Container, Oral, TID BM, Franchot Novel, MD   gabapentin  (NEURONTIN ) capsule 100 mg, 100 mg, Oral, TID, Franchot Novel, MD, 100 mg at 01/13/24 2206   guaiFENesin -dextromethorphan  (ROBITUSSIN DM) 100-10 MG/5ML syrup 5 mL, 5 mL, Oral, Q4H PRN, Franchot Novel, MD, 5 mL at 01/13/24 2206   HYDROmorphone  (DILAUDID ) injection 0.5 mg, 0.5 mg, Intravenous, Q3H PRN, Franchot Novel, MD, 0.5 mg at 01/13/24 1823   levothyroxine  (SYNTHROID ) tablet 50 mcg, 50 mcg, Oral, Q0600, Franchot Novel, MD, 50 mcg at 01/14/24 0505   loratadine  (CLARITIN ) tablet 10 mg, 10 mg, Oral, Daily, Franchot Novel, MD, 10 mg at 01/13/24 1220   multivitamin with minerals tablet 1 tablet, 1 tablet, Oral, Daily, Franchot Novel, MD, 1 tablet at 01/13/24 1219   ondansetron  (ZOFRAN ) tablet 4 mg, 4 mg, Oral, Q6H PRN **OR** ondansetron  (ZOFRAN ) injection 4 mg, 4 mg, Intravenous, Q6H PRN, Franchot Novel, MD, 4 mg at 01/13/24 2206   pantoprazole  (PROTONIX ) injection 40 mg, 40 mg, Intravenous, Q12H, Franchot Novel, MD, 40 mg at 01/13/24 2206   polyethylene glycol-electrolytes (NuLYTELY ) solution 2,000 mL, 2,000 mL, Oral, Once PRN, Ashrith Sagan Michael, DO   promethazine  (PHENERGAN ) 12.5 mg in sodium chloride  0.9 % 50 mL IVPB, 12.5 mg, Intravenous, Q6H PRN, Mansy, Jan A, MD, Last Rate: 150 mL/hr at 01/14/24 0531, Infusion Verify at 01/14/24 0531   traZODone  (DESYREL ) tablet 25 mg, 25 mg, Oral, QHS  PRN, Franchot Novel, MD, 25 mg at 01/12/24 2233  promethazine  (PHENERGAN ) injection (IM or IVPB) 150 mL/hr at 01/14/24 0531    acetaminophen  **OR** acetaminophen , albuterol , guaiFENesin -dextromethorphan , HYDROmorphone  (DILAUDID ) injection,  ondansetron  **OR** ondansetron  (ZOFRAN ) IV, polyethylene glycol-electrolytes, promethazine  (PHENERGAN ) injection (IM or IVPB), traZODone    Objective    Vitals:   01/13/24 0744 01/13/24 1641 01/13/24 2010 01/14/24 0422  BP: (!) 119/51 (!) 116/55 119/62 (!) 145/60  Pulse: 77 76 72 79  Resp: 18 18 18 18   Temp: 99 F (37.2 C) 98.4 F (36.9 C) 97.6 F (36.4 C) (!) 97.4 F (36.3 C)  TempSrc:      SpO2: 94% (!) 87% 94% 90%  Weight:      Height:         Physical Exam Vitals and nursing note reviewed.  Constitutional:      General: She is not in acute distress.    Appearance: Normal appearance. She is obese. She is ill-appearing. She is not toxic-appearing or diaphoretic.  HENT:     Head: Normocephalic and atraumatic.     Nose: Nose normal.     Mouth/Throat:     Mouth: Mucous membranes are moist.     Pharynx: Oropharynx is clear.  Eyes:     General: No scleral icterus.    Extraocular Movements: Extraocular movements intact.  Cardiovascular:     Rate and Rhythm: Normal rate and regular rhythm.  Pulmonary:     Effort: Pulmonary effort is normal. No respiratory distress.     Breath sounds: Normal breath sounds.  Musculoskeletal:     Cervical back: Neck supple.  Skin:    General: Skin is warm and dry.  Neurological:     General: No focal deficit present.     Mental Status: She is oriented to person, place, and time. Mental status is at baseline.      Laboratory Data Recent Labs  Lab 01/12/24 1038 01/12/24 1229 01/13/24 0626 01/14/24 0412  WBC 6.1 6.0 5.3 6.9  HGB 9.5* 9.6* 8.0* 8.4*  HCT 28.7* 29.8* 24.4* 25.8*  PLT 126* 129* 110* 126*  NEUTOPHILPCT 74  --   --   --   LYMPHOPCT 9  --   --   --   MONOPCT 11  --   --   --   EOSPCT 3  --   --   --    Recent Labs  Lab 01/09/24 1607 01/12/24 1038 01/12/24 1430 01/13/24 0626 01/14/24 0412  NA 140 136 142 139 136  K 3.8 3.3* 3.5 3.2* 3.2*  CL 107 101 104 102 101  CO2 23 26 27 26 27   BUN 10 8 7* 6* 8   CREATININE 0.84 0.77 0.65 0.82 0.74  CALCIUM  9.2 8.6* 8.8* 8.1* 8.2*  PROT 7.2 7.2 7.1  --   --   BILITOT 1.3* 1.4* 1.2  --   --   ALKPHOS 61 60 54  --   --   ALT 15 13 12   --   --   AST 10* 12* 13*  --   --   GLUCOSE 98 119* 100* 128* 145*   No results for input(s): INR in the last 168 hours.    Imaging Studies: US  Venous Img Lower Bilateral (DVT) Result Date: 01/13/2024 CLINICAL DATA:  73 year old female with lower extremity edema. EXAM: BILATERAL LOWER EXTREMITY VENOUS DOPPLER ULTRASOUND TECHNIQUE: Gray-scale sonography with compression, as well as color and duplex ultrasound, were performed to evaluate the deep venous system(s) from the  level of the common femoral vein through the popliteal and proximal calf veins. COMPARISON:  CT Abdomen and Pelvis with contrast 01/09/2024. FINDINGS: VENOUS Normal compressibility of the bilateral common femoral, superficial femoral, and popliteal veins, as well as the visualized calf veins. Visualized portions of the bilateral profunda femoral vein and great saphenous vein unremarkable. No filling defects to suggest DVT on grayscale or color Doppler imaging. Doppler waveforms show normal direction of venous flow, normal respiratory plasticity and response to augmentation. OTHER None. Limitations: none IMPRESSION: No evidence of bilateral lower extremity deep venous thrombosis. Electronically Signed   By: VEAR Hurst M.D.   On: 01/13/2024 03:58   DG Chest Portable 1 View Result Date: 01/12/2024 CLINICAL DATA:  SOB, abd pain EXAM: DG CHEST 1V PORT COMPARISON:  September 26, 2023 FINDINGS: No focal airspace consolidation, pleural effusion, or pneumothorax. Moderate cardiomegaly. Aortic atherosclerosis. No acute fracture or destructive lesions. Multilevel thoracic osteophytosis. Cervical fusion hardware. IMPRESSION: Cardiomegaly.  No pneumonia or pulmonary edema. Electronically Signed   By: Rogelia Myers M.D.   On: 01/12/2024 14:54    Assessment:   # Melena   #  acute on chronic anemia   # acute diarrhea   # LLQ pain   # proctitis on imaging   # gerd   # MDS   # Asthma   # phx colon polyps   # s/p hysterectomy  Plan:   Esophagogastroduodenoscopy and Colonoscopy planned for tomorrow pending patient stability and endoscopy suite availability NPO at midnight. Will try miralax /gatorade for prep instead of go lytely. Second set ordered prn if stools not clear after first [facility] for prn anti-emetics prior to starting. Go lytely ordered. Ok to drink after midnight npo order.  Clear liquids now Labs in am- bmp, cbc Protonix  40 mg iv q12 h Hold dvt ppx Infectious w/u negative Chemotherapy currently being held   Monitor H&H.  Transfusion and resuscitation as per primary team Avoid frequent lab draws to prevent lab induced anemia Supportive care and antiemetics as per primary team Maintain two sites IV access Avoid nsaids Monitor for GIB.  Dr. Jinny will be taking over the GI service tomorrow and performing the procedure.   Esophagogastroduodenoscopy and Colonoscopy with possible biopsy, control of bleeding, polypectomy, and interventions as necessary has been discussed with the patient/patient representative. Informed consent was obtained from the patient/patient representative after explaining the indication, nature, and risks of the procedure including but not limited to death, bleeding, perforation, missed neoplasm/lesions, cardiorespiratory compromise, and reaction to medications. Opportunity for questions was given and appropriate answers were provided. Patient/patient representative has verbalized understanding is amenable to undergoing the procedure.  Management of other medical comorbidities as per primary team  I personally performed the service.  Thank you for allowing us  to participate in this patient's care. Please don't hesitate to call if any questions or concerns arise.   Elspeth Ozell Jungling, DO [facility]  Gastroenterology  Portions of the record may have been created with voice recognition software. Occasional wrong-word or 'sound-a-like' substitutions may have occurred due to the inherent limitations of voice recognition software.  Read the chart carefully and recognize, using context, where substitutions may have occurred.

## 2024-01-14 NOTE — Plan of Care (Signed)

## 2024-01-15 ENCOUNTER — Inpatient Hospital Stay

## 2024-01-15 ENCOUNTER — Inpatient Hospital Stay
Admit: 2024-01-15 | Discharge: 2024-01-15 | Disposition: A | Discharge status: Home / Self Care | Attending: Critical Care Medicine | Admitting: Critical Care Medicine

## 2024-01-15 ENCOUNTER — Encounter
Admission: EM | Disposition: A | Discharge status: Other Acute Inpatient Hospital | Payer: Self-pay | Source: Ambulatory Visit | Attending: Internal Medicine

## 2024-01-15 DIAGNOSIS — J690 Pneumonitis due to inhalation of food and vomit: Secondary | ICD-10-CM

## 2024-01-15 DIAGNOSIS — E876 Hypokalemia: Secondary | ICD-10-CM

## 2024-01-15 DIAGNOSIS — R0600 Dyspnea, unspecified: Secondary | ICD-10-CM | POA: Diagnosis not present

## 2024-01-15 DIAGNOSIS — K529 Noninfective gastroenteritis and colitis, unspecified: Secondary | ICD-10-CM | POA: Diagnosis not present

## 2024-01-15 DIAGNOSIS — Z4682 Encounter for fitting and adjustment of non-vascular catheter: Secondary | ICD-10-CM | POA: Diagnosis not present

## 2024-01-15 DIAGNOSIS — J811 Chronic pulmonary edema: Secondary | ICD-10-CM | POA: Diagnosis not present

## 2024-01-15 DIAGNOSIS — R918 Other nonspecific abnormal finding of lung field: Secondary | ICD-10-CM | POA: Diagnosis not present

## 2024-01-15 DIAGNOSIS — J9601 Acute respiratory failure with hypoxia: Secondary | ICD-10-CM | POA: Diagnosis not present

## 2024-01-15 DIAGNOSIS — J189 Pneumonia, unspecified organism: Secondary | ICD-10-CM

## 2024-01-15 DIAGNOSIS — R739 Hyperglycemia, unspecified: Secondary | ICD-10-CM

## 2024-01-15 DIAGNOSIS — I2609 Other pulmonary embolism with acute cor pulmonale: Secondary | ICD-10-CM | POA: Diagnosis not present

## 2024-01-15 DIAGNOSIS — Z981 Arthrodesis status: Secondary | ICD-10-CM | POA: Diagnosis not present

## 2024-01-15 DIAGNOSIS — I517 Cardiomegaly: Secondary | ICD-10-CM | POA: Diagnosis not present

## 2024-01-15 DIAGNOSIS — N179 Acute kidney failure, unspecified: Secondary | ICD-10-CM | POA: Diagnosis not present

## 2024-01-15 DIAGNOSIS — R0609 Other forms of dyspnea: Secondary | ICD-10-CM

## 2024-01-15 DIAGNOSIS — E871 Hypo-osmolality and hyponatremia: Secondary | ICD-10-CM

## 2024-01-15 DIAGNOSIS — K6389 Other specified diseases of intestine: Secondary | ICD-10-CM | POA: Diagnosis not present

## 2024-01-15 LAB — BLOOD GAS, ARTERIAL
Acid-Base Excess: 1.4 mmol/L (ref 0.0–2.0)
Acid-Base Excess: 2.5 mmol/L — ABNORMAL HIGH (ref 0.0–2.0)
Acid-base deficit: 0.3 mmol/L (ref 0.0–2.0)
Bicarbonate: 28.1 mmol/L — ABNORMAL HIGH (ref 20.0–28.0)
Bicarbonate: 28.5 mmol/L — ABNORMAL HIGH (ref 20.0–28.0)
Bicarbonate: 27.8 mmol/L (ref 20.0–28.0)
FIO2: 70 %
FIO2: 60 %
FIO2: 0.6 %
MECHVT: 450 mL
MECHVT: 300 mL
MECHVT: 450 mL
Mechanical Rate: 20
Mechanical Rate: 20
O2 Saturation: 98.9 %
O2 Saturation: 94.4 %
O2 Saturation: 96.8 %
PEEP: 5 cmH2O
PEEP: 8 cmH2O
PEEP: 5 cmH2O
Patient temperature: 37
Patient temperature: 37
Patient temperature: 37
RATE: 20 {breaths}/min
pCO2 arterial: 52 mmHg — ABNORMAL HIGH (ref 32–48)
pCO2 arterial: 65 mmHg — ABNORMAL HIGH (ref 32–48)
pCO2 arterial: 46 mmHg (ref 32–48)
pH, Arterial: 7.34 — ABNORMAL LOW (ref 7.35–7.45)
pH, Arterial: 7.25 — ABNORMAL LOW (ref 7.35–7.45)
pH, Arterial: 7.39 (ref 7.35–7.45)
pO2, Arterial: 90 mmHg (ref 83–108)
pO2, Arterial: 73 mmHg — ABNORMAL LOW (ref 83–108)
pO2, Arterial: 70 mmHg — ABNORMAL LOW (ref 83–108)

## 2024-01-15 LAB — BASIC METABOLIC PANEL WITH GFR
Anion gap: 16 — ABNORMAL HIGH (ref 5–15)
BUN: 11 mg/dL (ref 8–23)
CO2: 21 mmol/L — ABNORMAL LOW (ref 22–32)
Calcium: 8.6 mg/dL — ABNORMAL LOW (ref 8.9–10.3)
Chloride: 100 mmol/L (ref 98–111)
Creatinine, Ser: 1.6 mg/dL — ABNORMAL HIGH (ref 0.44–1.00)
GFR, Estimated: 34 mL/min — ABNORMAL LOW (ref >60)
Glucose, Bld: 135 mg/dL — ABNORMAL HIGH (ref 70–99)
Potassium: 3.7 mmol/L (ref 3.5–5.1)
Sodium: 137 mmol/L (ref 135–145)

## 2024-01-15 LAB — CBC
HCT: 29.3 % — ABNORMAL LOW (ref 36.0–46.0)
Hemoglobin: 9.3 g/dL — ABNORMAL LOW (ref 12.0–15.0)
MCH: 36.2 pg — ABNORMAL HIGH (ref 26.0–34.0)
MCHC: 31.7 g/dL (ref 30.0–36.0)
MCV: 114 fL — ABNORMAL HIGH (ref 80.0–100.0)
Platelets: 165 K/uL (ref 150–400)
RBC: 2.57 MIL/uL — ABNORMAL LOW (ref 3.87–5.11)
RDW: 14.6 % (ref 11.5–15.5)
WBC: 9.8 K/uL (ref 4.0–10.5)
nRBC: 0 % (ref 0.0–0.2)

## 2024-01-15 LAB — COMPREHENSIVE METABOLIC PANEL WITH GFR
ALT: 13 U/L (ref 0–44)
ALT: 16 U/L (ref 0–44)
AST: 17 U/L (ref 15–41)
AST: 16 U/L (ref 15–41)
Albumin: 3.8 g/dL (ref 3.5–5.0)
Albumin: 3.9 g/dL (ref 3.5–5.0)
Alkaline Phosphatase: 58 U/L (ref 38–126)
Alkaline Phosphatase: 63 U/L (ref 38–126)
Anion gap: 9 (ref 5–15)
Anion gap: 11 (ref 5–15)
BUN: 8 mg/dL (ref 8–23)
BUN: 9 mg/dL (ref 8–23)
CO2: 24 mmol/L (ref 22–32)
CO2: 24 mmol/L (ref 22–32)
Calcium: 8.6 mg/dL — ABNORMAL LOW (ref 8.9–10.3)
Calcium: 8.7 mg/dL — ABNORMAL LOW (ref 8.9–10.3)
Chloride: 100 mmol/L (ref 98–111)
Chloride: 98 mmol/L (ref 98–111)
Creatinine, Ser: 0.99 mg/dL (ref 0.44–1.00)
Creatinine, Ser: 1.21 mg/dL — ABNORMAL HIGH (ref 0.44–1.00)
GFR, Estimated: 60 mL/min — ABNORMAL LOW (ref >60)
GFR, Estimated: 47 mL/min — ABNORMAL LOW (ref >60)
Glucose, Bld: 178 mg/dL — ABNORMAL HIGH (ref 70–99)
Glucose, Bld: 174 mg/dL — ABNORMAL HIGH (ref 70–99)
Potassium: 3.6 mmol/L (ref 3.5–5.1)
Potassium: 3.4 mmol/L — ABNORMAL LOW (ref 3.5–5.1)
Sodium: 133 mmol/L — ABNORMAL LOW (ref 135–145)
Sodium: 133 mmol/L — ABNORMAL LOW (ref 135–145)
Total Bilirubin: 0.9 mg/dL (ref 0.0–1.2)
Total Bilirubin: 0.9 mg/dL (ref 0.0–1.2)
Total Protein: 7.5 g/dL (ref 6.5–8.1)
Total Protein: 7.7 g/dL (ref 6.5–8.1)

## 2024-01-15 LAB — BODY FLUID CELL COUNT WITH DIFFERENTIAL
Eos, Fluid: 0 %
Lymphs, Fluid: 9 %
Monocyte-Macrophage-Serous Fluid: 7 % — ABNORMAL LOW (ref 50–90)
Neutrophil Count, Fluid: 84 % — ABNORMAL HIGH (ref 0–25)
Total Nucleated Cell Count, Fluid: 358 uL (ref 0–1000)

## 2024-01-15 LAB — ECHOCARDIOGRAM LIMITED
AR max vel: 2.26 cm2
AV Area VTI: 2.27 cm2
AV Area mean vel: 2.24 cm2
AV Mean grad: 6 mmHg
AV Peak grad: 11.8 mmHg
Ao pk vel: 1.72 m/s
Area-P 1/2: 4.64 cm2
Calc EF: 69.2 %
Height: 63 in
MV VTI: 1.72 cm2
S' Lateral: 2.9 cm
Single Plane A2C EF: 79.3 %
Single Plane A4C EF: 60.4 %
Weight: 2964.8 [oz_av]

## 2024-01-15 LAB — GLUCOSE, CAPILLARY
Glucose-Capillary: 197 mg/dL — ABNORMAL HIGH (ref 70–99)
Glucose-Capillary: 167 mg/dL — ABNORMAL HIGH (ref 70–99)
Glucose-Capillary: 171 mg/dL — ABNORMAL HIGH (ref 70–99)
Glucose-Capillary: 138 mg/dL — ABNORMAL HIGH (ref 70–99)
Glucose-Capillary: 167 mg/dL — ABNORMAL HIGH (ref 70–99)
Glucose-Capillary: 197 mg/dL — ABNORMAL HIGH (ref 70–99)

## 2024-01-15 LAB — CBC WITH DIFFERENTIAL/PLATELET
Abs Immature Granulocytes: 0.03 K/uL (ref 0.00–0.07)
Basophils Absolute: 0.2 K/uL — ABNORMAL HIGH (ref 0.0–0.1)
Basophils Relative: 2 %
Eosinophils Absolute: 0.2 K/uL (ref 0.0–0.5)
Eosinophils Relative: 3 %
HCT: 29.8 % — ABNORMAL LOW (ref 36.0–46.0)
Hemoglobin: 9.5 g/dL — ABNORMAL LOW (ref 12.0–15.0)
Immature Granulocytes: 0 %
Lymphocytes Relative: 11 %
Lymphs Abs: 0.8 K/uL (ref 0.7–4.0)
MCH: 36.7 pg — ABNORMAL HIGH (ref 26.0–34.0)
MCHC: 31.9 g/dL (ref 30.0–36.0)
MCV: 115.1 fL — ABNORMAL HIGH (ref 80.0–100.0)
Monocytes Absolute: 0.5 K/uL (ref 0.1–1.0)
Monocytes Relative: 7 %
Neutro Abs: 5.8 K/uL (ref 1.7–7.7)
Neutrophils Relative %: 77 %
Platelets: 170 K/uL (ref 150–400)
RBC: 2.59 MIL/uL — ABNORMAL LOW (ref 3.87–5.11)
RDW: 14.7 % (ref 11.5–15.5)
Smear Review: NORMAL
WBC: 7.5 K/uL (ref 4.0–10.5)
nRBC: 0 % (ref 0.0–0.2)

## 2024-01-15 LAB — D-DIMER, QUANTITATIVE: D-Dimer, Quant: 1.93 ug{FEU}/mL — ABNORMAL HIGH (ref 0.00–0.50)

## 2024-01-15 LAB — MAGNESIUM
Magnesium: 2.1 mg/dL (ref 1.7–2.4)
Magnesium: 2 mg/dL (ref 1.7–2.4)

## 2024-01-15 LAB — BRAIN NATRIURETIC PEPTIDE
B Natriuretic Peptide: 144.1 pg/mL — ABNORMAL HIGH (ref 0.0–100.0)
B Natriuretic Peptide: 115.1 pg/mL — ABNORMAL HIGH (ref 0.0–100.0)

## 2024-01-15 LAB — MRSA NEXT GEN BY PCR, NASAL: MRSA by PCR Next Gen: NOT DETECTED

## 2024-01-15 LAB — TROPONIN I (HIGH SENSITIVITY)
Troponin I (High Sensitivity): 4 ng/L (ref <18)
Troponin I (High Sensitivity): 4 ng/L (ref <18)

## 2024-01-15 LAB — HEMOGLOBIN A1C
Hgb A1c MFr Bld: 4.8 % (ref 4.8–5.6)
Mean Plasma Glucose: 91.06 mg/dL

## 2024-01-15 LAB — LACTIC ACID, PLASMA
Lactic Acid, Venous: 1.2 mmol/L (ref 0.5–1.9)
Lactic Acid, Venous: 1.5 mmol/L (ref 0.5–1.9)

## 2024-01-15 SURGERY — EGD (ESOPHAGOGASTRODUODENOSCOPY)
Anesthesia: General

## 2024-01-15 MED ORDER — IPRATROPIUM-ALBUTEROL 0.5-2.5 (3) MG/3ML IN SOLN
3.0000 mL | Freq: Four times a day (QID) | RESPIRATORY_TRACT | Status: DC
  Administered 2024-01-15: 3 mL via RESPIRATORY_TRACT
  Filled 2024-01-15: qty 3

## 2024-01-15 MED ORDER — LIDOCAINE HCL (PF) 2% IJ FOR NEBU: 5.0000 mL | Freq: Once | RESPIRATORY_TRACT | Status: DC

## 2024-01-15 MED ORDER — ETOMIDATE 2 MG/ML IV SOLN: 10.0000 mg | Freq: Once | INTRAVENOUS | Status: AC

## 2024-01-15 MED ORDER — FUROSEMIDE 10 MG/ML IJ SOLN
40.0000 mg | Freq: Once | INTRAMUSCULAR | Status: AC
  Administered 2024-01-15: 40 mg via INTRAVENOUS
  Filled 2024-01-15: qty 4

## 2024-01-15 MED ORDER — ROCURONIUM BROMIDE 10 MG/ML (PF) SYRINGE: 80.0000 mg | PREFILLED_SYRINGE | Freq: Once | INTRAVENOUS | Status: AC

## 2024-01-15 MED ORDER — METOCLOPRAMIDE HCL 5 MG/ML IJ SOLN: 10.0000 mg | Freq: Four times a day (QID) | INTRAMUSCULAR | Status: DC | PRN

## 2024-01-15 MED ORDER — NOREPINEPHRINE 4 MG/250ML-% IV SOLN
0.0000 ug/min | INTRAVENOUS | Status: DC
  Administered 2024-01-15: 2 ug/min via INTRAVENOUS
  Filled 2024-01-15: qty 250

## 2024-01-15 MED ORDER — MIDAZOLAM HCL 2 MG/2ML IJ SOLN
INTRAMUSCULAR | Status: AC
  Filled 2024-01-15: qty 2

## 2024-01-15 MED ORDER — ONDANSETRON HCL 4 MG/2ML IJ SOLN
4.0000 mg | INTRAMUSCULAR | Status: DC | PRN
  Administered 2024-01-21: 4 mg via INTRAVENOUS
  Filled 2024-01-15: qty 2

## 2024-01-15 MED ORDER — PERFLUTREN LIPID MICROSPHERE
1.0000 mL | INTRAVENOUS | Status: AC | PRN
  Administered 2024-01-15: 2 mL via INTRAVENOUS

## 2024-01-15 MED ORDER — FUROSEMIDE 10 MG/ML IJ SOLN
40.0000 mg | Freq: Two times a day (BID) | INTRAMUSCULAR | Status: AC
  Administered 2024-01-15 – 2024-01-17 (×3): 40 mg via INTRAVENOUS
  Filled 2024-01-15 (×4): qty 4

## 2024-01-15 MED ORDER — METHYLPREDNISOLONE SODIUM SUCC 40 MG IJ SOLR
40.0000 mg | Freq: Every day | INTRAMUSCULAR | Status: AC
  Administered 2024-01-15 – 2024-01-19 (×5): 40 mg via INTRAVENOUS
  Filled 2024-01-15 (×5): qty 1

## 2024-01-15 MED ORDER — INSULIN ASPART 100 UNIT/ML IJ SOLN
0.0000 [IU] | INTRAMUSCULAR | Status: DC
  Administered 2024-01-15: 2 [IU] via SUBCUTANEOUS
  Administered 2024-01-15: 1 [IU] via SUBCUTANEOUS
  Administered 2024-01-15 (×2): 2 [IU] via SUBCUTANEOUS
  Administered 2024-01-16 (×2): 1 [IU] via SUBCUTANEOUS
  Administered 2024-01-16: 2 [IU] via SUBCUTANEOUS
  Administered 2024-01-16 – 2024-01-17 (×3): 1 [IU] via SUBCUTANEOUS
  Administered 2024-01-17: 2 [IU] via SUBCUTANEOUS
  Filled 2024-01-15 (×12): qty 1

## 2024-01-15 MED ORDER — PIPERACILLIN-TAZOBACTAM 3.375 G IVPB
3.3750 g | Freq: Three times a day (TID) | INTRAVENOUS | Status: DC
  Administered 2024-01-15 – 2024-01-17 (×6): 3.375 g via INTRAVENOUS
  Filled 2024-01-15 (×6): qty 50

## 2024-01-15 MED ORDER — FENTANYL CITRATE (PF) 100 MCG/2ML IJ SOLN
INTRAMUSCULAR | Status: AC
  Administered 2024-01-15: 100 ug via INTRAVENOUS
  Filled 2024-01-15: qty 2

## 2024-01-15 MED ORDER — CHLORHEXIDINE GLUCONATE CLOTH 2 % EX PADS
6.0000 | MEDICATED_PAD | Freq: Every day | CUTANEOUS | Status: DC
  Administered 2024-01-15 – 2024-01-19 (×4): 6 via TOPICAL

## 2024-01-15 MED ORDER — MIDAZOLAM HCL 2 MG/2ML IJ SOLN
INTRAMUSCULAR | Status: AC
  Administered 2024-01-15: 2 mg
  Filled 2024-01-15: qty 2

## 2024-01-15 MED ORDER — POTASSIUM CHLORIDE 10 MEQ/100ML IV SOLN
10.0000 meq | INTRAVENOUS | Status: AC
Start: 2024-01-15 — End: 2024-01-15
  Administered 2024-01-15 (×2): 10 meq via INTRAVENOUS
  Filled 2024-01-15 (×2): qty 100

## 2024-01-15 MED ORDER — ETOMIDATE 2 MG/ML IV SOLN
INTRAVENOUS | Status: AC
  Administered 2024-01-15: 10 mg via INTRAVENOUS
  Filled 2024-01-15: qty 10

## 2024-01-15 MED ORDER — FENTANYL CITRATE PF 50 MCG/ML IJ SOSY
50.0000 ug | PREFILLED_SYRINGE | INTRAMUSCULAR | Status: DC | PRN
  Administered 2024-01-15 (×2): 100 ug via INTRAVENOUS
  Administered 2024-01-16 (×2): 50 ug via INTRAVENOUS
  Filled 2024-01-15: qty 2
  Filled 2024-01-15 (×2): qty 1

## 2024-01-15 MED ORDER — MIDAZOLAM HCL 2 MG/2ML IJ SOLN
2.0000 mg | Freq: Once | INTRAMUSCULAR | Status: AC
  Administered 2024-01-15: 2 mg via INTRAVENOUS

## 2024-01-15 MED ORDER — FENTANYL CITRATE (PF) 100 MCG/2ML IJ SOLN: 100.0000 ug | Freq: Once | INTRAMUSCULAR | Status: AC

## 2024-01-15 MED ORDER — IPRATROPIUM-ALBUTEROL 0.5-2.5 (3) MG/3ML IN SOLN
3.0000 mL | Freq: Once | RESPIRATORY_TRACT | Status: DC
  Filled 2024-01-15: qty 3

## 2024-01-15 MED ORDER — FUROSEMIDE 10 MG/ML IJ SOLN: 40.0000 mg | Freq: Once | INTRAMUSCULAR | Status: DC

## 2024-01-15 MED ORDER — ROCURONIUM BROMIDE 10 MG/ML (PF) SYRINGE
PREFILLED_SYRINGE | INTRAVENOUS | Status: AC
  Administered 2024-01-15: 80 mg via INTRAVENOUS
  Filled 2024-01-15: qty 10

## 2024-01-15 MED ORDER — PROPOFOL 1000 MG/100ML IV EMUL
0.0000 ug/kg/min | INTRAVENOUS | Status: DC
  Administered 2024-01-15: 45 ug/kg/min via INTRAVENOUS
  Administered 2024-01-15: 50 ug/kg/min via INTRAVENOUS
  Administered 2024-01-15: 5 ug/kg/min via INTRAVENOUS
  Administered 2024-01-15: 50 ug/kg/min via INTRAVENOUS
  Administered 2024-01-16 (×2): 45 ug/kg/min via INTRAVENOUS
  Filled 2024-01-15 (×6): qty 100

## 2024-01-15 MED ORDER — IPRATROPIUM-ALBUTEROL 0.5-2.5 (3) MG/3ML IN SOLN
3.0000 mL | Freq: Four times a day (QID) | RESPIRATORY_TRACT | Status: DC
  Administered 2024-01-15 – 2024-01-18 (×13): 3 mL via RESPIRATORY_TRACT
  Filled 2024-01-15 (×13): qty 3

## 2024-01-15 MED ORDER — MIDAZOLAM HCL 2 MG/2ML IJ SOLN: 2.0000 mg | Freq: Once | INTRAMUSCULAR | Status: AC

## 2024-01-15 MED ORDER — FENTANYL CITRATE (PF) 100 MCG/2ML IJ SOLN
INTRAMUSCULAR | Status: AC
  Filled 2024-01-15: qty 2

## 2024-01-15 MED ORDER — IPRATROPIUM-ALBUTEROL 0.5-2.5 (3) MG/3ML IN SOLN
3.0000 mL | RESPIRATORY_TRACT | Status: DC
  Administered 2024-01-15: 3 mL via RESPIRATORY_TRACT
  Filled 2024-01-15: qty 3

## 2024-01-15 NOTE — Care Management Important Message (Signed)
 Important Message  Patient Details  Name: Sarah [name] MRN: [id number] Date of Birth: [birth year]   Important Message Given:  Yes - Medicare IM     Sarah [name] 01/15/2024, 4:54 PM

## 2024-01-15 NOTE — Progress Notes (Signed)
   01/15/24 1100  Spiritual Encounters  Type of Visit Follow up  Care provided to: Family  Referral source Chaplain assessment  Reason for visit Routine spiritual support  OnCall Visit Yes  Interventions  Spiritual Care Interventions Made Established relationship of care and support;Compassionate presence  Intervention Outcomes  Outcomes Connection to spiritual care   Chaplain provided comfort and encouragement to family.

## 2024-01-15 NOTE — Progress Notes (Signed)
 PHARMACY CONSULT NOTE - FOLLOW UP  Pharmacy Consult for Electrolyte Monitoring and Replacement   Recent Labs: Potassium (mmol/L)  Date Value  01/15/2024 3.4 (L)   Magnesium (mg/dL)  Date Value  90/79/7974 1.8   Calcium  (mg/dL)  Date Value  90/77/7974 8.7 (L)   Albumin (g/dL)  Date Value  90/77/7974 3.9   Sodium (mmol/L)  Date Value  01/15/2024 133 (L)     Assessment: 73 y.o. female with medical history significant of MDS, Asthma/bronchiectasis, GERD, HTN, Hypothyroidism, and HTN. Pharmacy is asked to follow and replace electrolytes while in CCU  Diuretics: furosemide  40 mg IV every 12 hours  Goal of Therapy:  Electrolytes WNL  Plan:  ---10 mEq IV KCl x 2 ---recheck electrolytes at 1400 and again in am  Adriana JONETTA Bolster ,PharmD Clinical Pharmacist 01/15/2024 7:07 AM

## 2024-01-15 NOTE — TOC Progression Note (Signed)
 Transition of Care [facility]) - Progression Note    Patient Details  Name: Sarah [name] MRN: [id number] Date of Birth: 1951-01-12  [facility] CM/SW Contact  K'La JINNY Ruts, LCSW Phone Number: 01/15/2024, 11:51 AM  Clinical Narrative:    Chart reviewed. The patient is currently intubated. I called the patient friend on her contact list. The patient friend did not answer to complete consult. I was not able to leave a message due to mailbox being full. I am waiting for a call back.                      Expected Discharge Plan and Services                                               Social Drivers of Health (SDOH) Interventions SDOH Screenings   Food Insecurity: No Food Insecurity (01/12/2024)  Housing: Low Risk  (01/12/2024)  Transportation Needs: No Transportation Needs (01/12/2024)  Utilities: Not At Risk (01/12/2024)  Alcohol Screen: Low Risk  (05/15/2023)  Depression (PHQ2-9): Low Risk  (01/12/2024)  Recent Concern: Depression (PHQ2-9) - Medium Risk (11/06/2023)  Financial Resource Strain: Low Risk  (05/15/2023)  Physical Activity: Inactive (05/15/2023)  Social Connections: Moderately Isolated (01/12/2024)  Stress: No Stress Concern Present (05/15/2023)  Tobacco Use: Low Risk  (01/12/2024)  Health Literacy: Adequate Health Literacy (09/12/2023)    Readmission Risk Interventions     No data to display

## 2024-01-15 NOTE — Procedures (Signed)
 Bronchoscopy Procedure Note  Sarah [name]  [id number]  1951/03/01  Date:01/15/24  Time:5:23 PM   Provider Performing:Jean-Pierre Ronisha Herringshaw   Procedure(s):  Flexible Bronchoscopy [phone])  Indication(s) Flexible Bronchoscopy  Consent Unable to obtain consent due to emergent nature of procedure.  Anesthesia Patient already intubated in the ICU.  Time Out Verified patient identification, verified procedure, site/side was marked, verified correct patient position, special equipment/implants available, medications/allergies/relevant history reviewed, required imaging and test results available.  Sterile Technique Usual hand hygiene, masks, gowns, and gloves were used  Procedure Description Bronchoscope advanced through endotracheal tube and into airway.  Airways were examined down to subsegmental level with findings noted below.   Following diagnostic evaluation, BAL(s) performed in Left Lower Lobe with normal saline and return of 15cc of clear fluid  Findings: Carina appeared sharp but there is severe Excessive Dynamic Airway collapse > 90% at the level of the distal trachea, RM and LM. No secretions and no signs of DAH.    Complications/Tolerance None; patient tolerated the procedure well. Chest X-ray is not needed post procedure.   EBL Minimal   Specimen(s) -- BAL sent for cell count and differential as well as culture and gram stain.   Darrin Barn, MD [facility] 01/15/2024 5:26 PM

## 2024-01-15 NOTE — Progress Notes (Signed)
 RT assisted Dr Malka with bedside bronchoscopy. Fio2 increased to 100% for procedure then weaned back to pre procedure setting at 60%. Pt tolerated well with no adverse reactions. Consent and time out obtained and performed by appropriate personnel prior to start of procedure.

## 2024-01-15 NOTE — Progress Notes (Signed)
 Initial Nutrition Assessment  DOCUMENTATION CODES:   Obesity unspecified  INTERVENTION:   -If unable to extubate, recommend:  Initiate Vital High Protein @ 20 ml/hr and increase by 10 ml every 8 hours to goal rate of 50 ml/hr.   30 ml free water  flush every 4 hours  Tube feeding regimen provides 1200 kcal (100% of needs), 105 grams of protein, and 1003 ml of H2O. Total free water : 1183 ml daily   NUTRITION DIAGNOSIS:   Inadequate oral intake related to inability to eat as evidenced by NPO status.  GOAL:   Provide needs based on ASPEN/SCCM guidelines  MONITOR:   Vent status  REASON FOR ASSESSMENT:   Ventilator    ASSESSMENT:   Pt first seen on 09/19 with c/o LLQ abdominal pain with black stool onset several days prior to presentation.  Per ED notes on 09/16 pt was sent over from the cancer center to the ER with abdominal pain and dark tarry stools.  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  She received metronidazole  and ceftriaxone .  EDP recommended hospital admission, however pt refused.  She was discharged from the ER and prescribed Augmentin  and instructed to follow-up with her PCP.  Pt admitted with aspiration pneumonia and GI bleed.   Patient is currently intubated on ventilator support. OGT placement confirmed; tip and side port overlying the expected region of the gastric lumen per KUB on 01/15/24. MV: 9 L/min Temp (24hrs), Avg:98.2 F (36.8 C), Min:97.7 F (36.5 C), Max:98.8 F (37.1 C)  Propofol : 25.2 ml/hr (provides 665 kcals daily)  Reviewed I/O's: +960 ml x 24 hours and +2.8 L since admission  Case discussed with RN, MD, and during ICU rounds. Pt has been having dark stools PTA and underwent bowel prep x 2. Per RN, pt aspirated overnight. Pt is not happy about being on the vent and is alert on arousal.   Pt currently down for CT of chest. Pt unavailable at time of visit. Pt is also awaiting GI  consult.   Wt has been stable over the past 3 months.   Medications reviewed and include protonix  and solu-medrol .   Labs reviewed: Na: 133, K: 3.4, CBGS: 167-197 (inpatient orders for glycemic control are 0-9 units insulin  aspart every 4 hours).    Diet Order:   Diet Order             Diet NPO time specified  Diet effective midnight                   EDUCATION NEEDS:   Not appropriate for education at this time  Skin:  Skin Assessment: Reviewed RN Assessment  Last BM:  01/14/24 (type 1)  Height:   Ht Readings from Last 1 Encounters:  01/12/24 5' 3 (1.6 m)    Weight:   Wt Readings from Last 1 Encounters:  01/12/24 84.1 kg    Ideal Body Weight:  52.3 kg  BMI:  Body mass index is 32.82 kg/m.  Estimated Nutritional Needs:   Kcal:  [phone]  Protein:  >105 grams  Fluid:  1.2-1.4 L    Margery ORN, RD, LDN, CDCES Registered Dietitian III Certified Diabetes Care and Education Specialist If unable to reach this RD, please use RD Inpatient group chat on secure chat between hours of 8am-4 pm daily

## 2024-01-15 NOTE — Progress Notes (Signed)
   01/15/24 0225  Spiritual Encounters  Type of Visit Initial  Care provided to: Pt not available  Conversation partners present during encounter Nurse  Referral source Code page  Reason for visit Code  OnCall Visit Yes  Spiritual Care Plan  Spiritual Care Issues Still Outstanding Referring to oncoming chaplain for further support   Chaplain responded to rapid response, but pt was unavailable. Pt was moved to ICU and chaplain is referring pt to on coming chaplain.

## 2024-01-15 NOTE — Plan of Care (Signed)
   Problem: Clinical Measurements: Goal: Ability to maintain clinical measurements within normal limits will improve Outcome: Progressing Goal: Will remain free from infection Outcome: Progressing

## 2024-01-15 NOTE — Procedures (Signed)
 INTUBATION PROCEDURE NOTE  TYJA GORTNEY  [id number]  Jul 22, 1950  Date:01/15/24  Time:5:37 AM   Provider Performing:Debbie Bellucci A Draden Cottingham   Procedure: Intubation (31500)  Indication(s) Respiratory Failure  Consent Unable to obtain consent due to emergent nature of procedure.  Anesthesia Etomidate , Fentanyl , and Rocuronium   Time Out Verified patient identification, verified procedure, site/side was marked, verified correct patient position, special equipment/implants available, medications/allergies/relevant history reviewed, required imaging and test results available.  Sterile Technique Usual hand hygeine, masks, and gloves were used  Procedure Description Patient positioned in bed supine.  Sedation given as noted above.  Patient was intubated with endotracheal tube using Glidescope.  View was Grade 1 full glottis .  Number of attempts was 1.  Colorimetric CO2 detector was consistent with tracheal placement.  Complications/Tolerance None; patient tolerated the procedure well. Chest X-ray is ordered to verify placement.  EBL Minimal  Specimen(s) None   Almarie Nose, DNP, CCRN, FNP-C, AGACNP-BC Acute Care & Family Nurse Practitioner  [facility]  See Amion for personal pager PCCM on call pager [phone] until 7 am

## 2024-01-15 NOTE — Consult Note (Signed)
 NAME:  Sarah [name], MRN:  [id number], DOB:  1951-04-13, LOS: 3 ADMISSION DATE:  01/12/2024, CONSULTATION DATE: 01/15/2024 REFERRING MD: Dr. Lawence, CHIEF COMPLAINT: Shortness of Breath   History of Present Illness:  This is a 73 yo female who presented to [facility] ER on 09/19 with c/o LLQ abdominal pain with black stool onset several days prior to presentation.  Per ED notes on 09/16 pt was sent over from the cancer center to the ER with abdominal pain and dark tarry stools.  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  She received metronidazole  and ceftriaxone .  EDP recommended hospital admission, however pt refused.  She was discharged from the ER and prescribed Augmentin  and instructed to follow-up with her PCP.    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  However, treatment put on hold since 09/18 per oncologist recommendations due to colitis dx.  She has chronic dyspnea at baseline, but reports this has worsened along with worsening bilateral lower extremity edema she suspected this is due to chemotherapy treatment.   ED Course  Upon arrival to the ER during this presentation pt reported worsening abdominal pain, nausea, and poor po intake despite compliance with Augmentin .  Significant lab results: K+ 3.3/calcium  8.6/total bilirubin 1.4/hgb 9.5/platelet count 126.  CXR negative for acute cardiopulmonary disease.  Pt received ceftriaxone /metronidazole /4 mg of iv morphine /1L NS bolus.  Pt admitted to the [facility] per hospitalist team for additional workup and treatment.    Pertinent  Medical History  Arthritis  Asthma  Depression  Dyspnea GERD HLD HTN  Hypothyroidism  CML  Significant Hospital Events: Including procedures, antibiotic start and stop dates in addition to other pertinent events   09/19: Admitted to the medsurg unit with acute GI bleed and proctocolitis  09/20: GI consulted and  scheduled EGD and colonoscopy with possible biopsy on 09/22 09/22: Pt ingested bowel prep overnight and developed acute hypoxic respiratory failure secondary to aspiration following multiple episodes of emesis requiring transfer to ICU and mechanical intubation   Interim History / Subjective:  Vital signs stable pt sedating on propofol  gtt   Objective    Blood pressure (!) 131/55, pulse 84, temperature 97.7 F (36.5 C), temperature source Axillary, resp. rate 20, height 5' 3 (1.6 m), weight 84.1 kg, SpO2 93%.    Vent Mode: PRVC FiO2 (%):  [70 %-100 %] 70 % Set Rate:  [20 bmp] 20 bmp Vt Set:  [450 mL] 450 mL PEEP:  [5 cmH20] 5 cmH20 Plateau Pressure:  [26 cmH20] 26 cmH20   Intake/Output Summary (Last 24 hours) at 01/15/2024 9185 Last data filed at 01/15/2024 0600 Gross per 24 hour  Intake 960.32 ml  Output --  Net 960.32 ml   Filed Weights   01/12/24 1229 01/12/24 1832  Weight: 86.2 kg 84.1 kg    Examination: General: Acutely-ill appearing female, NAD mechanically intubated  HENT: Supple, no JVD  Lungs: Diffuse rhonchi throughout, even, non labored  Cardiovascular: NSR with BBB, s1s2, no m/r/g, 2+ radial/1+ distal pulses, no edema  Abdomen: +BS x4, obese, taut, mildly distended  Extremities: Normal bulk and tone Neuro: Sedated, not following commands, withdraws from stimulation, PERRL  GU: External catheter draining yellow urine   Resolved problem list   Assessment and Plan   #Mechanical intubation pain/discomfort  Hx: Depression  - Maintain RASS goal 0 to -1 - PAD protocol to maintain RASS goal: propofol  gtt and prn fentanyl   - WUA daily -  Resume outpatient celexa  once able to tolerate po's   #HTN  Hx: HLD  - Continuous telemetry monitoring  - Limited Echo pending  - Troponin's pending  - Continue outpatient amlodipine  once able to tolerate po's   #Acute hypoxic hypercapnic respiratory failure #Pneumonia likely secondary to aspiration  #Mechanical intubation   Hx: Chronic dyspnea and asthma  - Full vent support for now: vent settings reviewed and established - Continue lung protective strategies  - Maintain plateau pressures less than 30 cm H2O - SBT once all parameters met  - VAP bundle implemented  - Scheduled and prn bronchodilator therapy  - Intermittent CXR's and ABG's - CT Chest pending   #Mild AKI  #Hyponatremia  #Hypokalemia  - Trend BMP  - Replace electrolytes as indicated  - Strict I&O's - Avoid nephrotoxic agents as able   #Possible aspiration pneumonia  #Proctocolitis  - Trend WBC and monitor fever curve  - Follow cultures  - Will start empiric zosyn  for now   #Acute on chronic anemia  #Melena  #Acute diarrhea secondary to proctocolitis  - Trend CBC  - Monitor for s/sx of bleeding  - Transfuse for hgb <7 - PPI a12hrs  - Avoid chemical VTE px - GI consulted appreciate: recommending EGD and colonoscopy   #Hypothyroidism  - Resume outpatient synthroid    #Hyperglycemia  - Hemoglobin A1c pending  - CBGs q4hrs - SSI  - Target CBG readings 140 to 180 - Follow hyper/hypoglycemic protocol   Pts friend at bedside she reports the pt does not have any family.  Updated pts friend regarding pts condition and current plan of care all questions were answered.   Labs   CBC: Recent Labs  Lab 01/12/24 1038 01/12/24 1229 01/13/24 0626 01/14/24 0412 01/15/24 0235 01/15/24 0537  WBC 6.1 6.0 5.3 6.9 9.8 7.5  NEUTROABS 4.5  --   --   --   --  5.8  HGB 9.5* 9.6* 8.0* 8.4* 9.3* 9.5*  HCT 28.7* 29.8* 24.4* 25.8* 29.3* 29.8*  MCV 112.1* 114.2* 113.0* 112.7* 114.0* 115.1*  PLT 126* 129* 110* 126* 165 170    Basic Metabolic Panel: Recent Labs  Lab 01/12/24 1038 01/12/24 1430 01/13/24 0626 01/13/24 0636 01/14/24 0412 01/15/24 0235 01/15/24 0537  NA 136 142 139  --  136 133* 133*  K 3.3* 3.5 3.2*  --  3.2* 3.6 3.4*  CL 101 104 102  --  101 100 98  CO2 26 27 26   --  27 24 24   GLUCOSE 119* 100* 128*  --  145* 178*  174*  BUN 8 7* 6*  --  8 8 9   CREATININE 0.77 0.65 0.82  --  0.74 0.99 1.21*  CALCIUM  8.6* 8.8* 8.1*  --  8.2* 8.6* 8.7*  MG 2.1  --   --  1.8  --   --   --    GFR: Estimated Creatinine Clearance: 41.9 mL/min (A) (by C-G formula based on SCr of 1.21 mg/dL (H)). Recent Labs  Lab 01/13/24 0626 01/14/24 0412 01/15/24 0235 01/15/24 0537 01/15/24 0538  WBC 5.3 6.9 9.8 7.5  --   LATICACIDVEN  --   --   --   --  1.2    Liver Function Tests: Recent Labs  Lab 01/09/24 1607 01/12/24 1038 01/12/24 1430 01/15/24 0235 01/15/24 0537  AST 10* 12* 13* 17 16  ALT 15 13 12 13 16   ALKPHOS 61 60 54 58 63  BILITOT 1.3* 1.4* 1.2 0.9 0.9  PROT  7.2 7.2 7.1 7.5 7.7  ALBUMIN 4.0 3.9 3.6 3.8 3.9   Recent Labs  Lab 01/09/24 1607  LIPASE 22   No results for input(s): AMMONIA in the last 168 hours.  ABG    Component Value Date/Time   PHART 7.34 (L) 01/15/2024 0629   PCO2ART 52 (H) 01/15/2024 0629   PO2ART 90 01/15/2024 0629   HCO3 28.1 (H) 01/15/2024 0629   O2SAT 98.9 01/15/2024 0629     Coagulation Profile: No results for input(s): INR, PROTIME in the last 168 hours.  Cardiac Enzymes: No results for input(s): CKTOTAL, CKMB, CKMBINDEX, TROPONINI in the last 168 hours.  HbA1C: Hgb A1c MFr Bld  Date/Time Value Ref Range Status  11/21/2022 02:06 PM 5.9 4.6 - 6.5 % Final    Comment:    Glycemic Control Guidelines for People with Diabetes:Non Diabetic:  <6%Goal of Therapy: <7%Additional Action Suggested:  >8%   08/15/2022 08:35 PM 6.3 (H) 4.8 - 5.6 % Final    Comment:    (NOTE) Pre diabetes:          5.7%-6.4%  Diabetes:              >6.4%  Glycemic control for   <7.0% adults with diabetes     CBG: Recent Labs  Lab 01/15/24 0523 01/15/24 0740  GLUCAP 197* 167*    Review of Systems:   Unable to assess pt mechanically intubated   Past Medical History:  She,  has a past medical history of Arthritis, Asthma, CAP (community acquired pneumonia)  (08/11/2022), Depression, Dyspnea, Environmental and seasonal allergies, GERD (gastroesophageal reflux disease), Hyperlipidemia, Hypertension, Hypothyroidism, Leukemia (HCC), Presence of dental prosthetic device, and Wheezing.   Surgical History:   Past Surgical History:  Procedure Laterality Date   ABDOMINAL HYSTERECTOMY  04/26/1987   ANTERIOR CERVICAL DECOMPRESSION/DISCECTOMY FUSION 4 LEVELS N/A 11/10/2021   Procedure: C3-7 ANTERIOR CERVICAL DISCECTOMY AND FUSION (GLOBUS FORGE);  Surgeon: Clois Fret, MD;  Location: [facility];  Service: Neurosurgery;  Laterality: N/A;   BROW LIFT Bilateral 02/07/2020   Procedure: BLEPHAROPLASTY UPPER EYELID; W/EXCESS SKIN BROW PTOSIS REPAIR BILATERAL;  Surgeon: Ashley Greig HERO, MD;  Location: [facility];  Service: Ophthalmology;  Laterality: Bilateral;   CATARACT EXTRACTION W/PHACO Right 09/13/2016   Procedure: CATARACT EXTRACTION PHACO AND INTRAOCULAR LENS PLACEMENT (IOC);  Surgeon: Jaye Fallow, MD;  Location: [facility];  Service: Ophthalmology;  Laterality: Right;  US  00:38 AP% 14.3 CDE 5.51 Fluid pack lot # [id number]   CATARACT EXTRACTION W/PHACO Left 10/11/2016   Procedure: CATARACT EXTRACTION PHACO AND INTRAOCULAR LENS PLACEMENT (IOC);  Surgeon: Jaye Fallow, MD;  Location: [facility];  Service: Ophthalmology;  Laterality: Left;  US  00:30 AP% 11.3 CDE 3.50 fluid pack lot # [id number]   COLONOSCOPY WITH PROPOFOL  N/A 09/30/2014   Procedure: COLONOSCOPY WITH PROPOFOL ;  Surgeon: Reyes LELON Cota, MD;  Location: [facility];  Service: Endoscopy;  Laterality: N/A;   COLONOSCOPY WITH PROPOFOL  N/A 03/24/2021   Procedure: COLONOSCOPY WITH PROPOFOL ;  Surgeon: Unk Corinn Skiff, MD;  Location: [facility];  Service: Gastroenterology;  Laterality: N/A;   ESOPHAGOGASTRODUODENOSCOPY N/A 09/30/2014   Procedure: ESOPHAGOGASTRODUODENOSCOPY (EGD);  Surgeon: Reyes LELON Cota, MD;  [facility];  Service: Endoscopy;  Laterality:  N/A;   ESOPHAGOGASTRODUODENOSCOPY N/A 03/24/2021   Procedure: ESOPHAGOGASTRODUODENOSCOPY (EGD);  Surgeon: Unk Corinn Skiff, MD;  Location: [facility] END[facility]COPY;  Service: Gastroenterology;  Laterality: N/A;   FOOT SURGERY Bilateral 04/26/1983   KNEE SURGERY  04/25/2014     Social History:   reports  that she has never smoked. She has never used smokeless tobacco. She reports that she does not drink alcohol and does not use drugs.   Family History:  Her family history includes Diabetes in her mother; Heart disease in her mother; Hyperlipidemia in her mother. There is no history of Prostate cancer, Kidney cancer, or Bladder Cancer.   Allergies No Known Allergies   Home Medications  Prior to Admission medications   Medication Sig Start Date End Date Taking? Authorizing Provider  albuterol  (VENTOLIN  HFA) 108 (90 Base) MCG/ACT inhaler Inhale 1-2 puffs into the lungs every 6 (six) hours as needed for wheezing or shortness of breath. 05/05/23  Yes [name], MD  amLODipine  (NORVASC ) 5 MG tablet TAKE 1 TABLET BY MOUTH DAILY 08/14/23  Yes Tullo, Teresa L, MD  aspirin  EC 81 MG tablet Take 81 mg by mouth daily. Swallow whole.   Yes [provider]  azithromycin  (ZITHROMAX ) 250 MG tablet Take 1 tablet (250 mg total) by mouth 3 (three) times a week. M-W-F 12/08/23  Yes Tamea Dedra CROME, MD  Calcium  Citrate-Vitamin D  (CALCIUM  CITRATE + D PO) Take 1 tablet by mouth 2 (two) times daily.   Yes [provider]  citalopram  (CELEXA ) 20 MG tablet TAKE 1 TABLET BY MOUTH DAILY 12/26/23  Yes Tullo, Teresa L, MD  famotidine  (PEPCID ) 20 MG tablet TAKE 1 TABLET BY MOUTH DAILY. BEFORE DINNER 07/28/17  Yes [name], MD  ferrous sulfate  324 MG TBEC Take 2 tablets by mouth. 65 mg of elemental iron    Yes [provider]  fexofenadine (ALLEGRA) 180 MG tablet Take 180 mg by mouth daily.   Yes [provider]  gabapentin  (NEURONTIN ) 100 MG capsule TAKE 1 CAPSULE BY MOUTH 3 TIMES   DAILY 11/10/23  Yes [name], MD  lenalidomide  (REVLIMID ) 10 MG capsule Take 1 capsule (10 mg total) by mouth daily. Take for 21 days, then hold for 7 days. Repeat every 28 days. 01/02/24  Yes Brahmanday, Govinda R, MD  levothyroxine  (SYNTHROID ) 50 MCG tablet TAKE 1 TABLET BY MOUTH DAILY ON  AN EMPTY STOMACH WITH A GLASS OF WATER  AT LEAST 30 TO 60 MINUTES  BEFORE BREAKFAST 08/14/23  Yes [name], MD  pantoprazole  (PROTONIX ) 40 MG tablet TAKE 1 TABLET BY MOUTH DAILY 08/14/23  Yes Tullo, Teresa L, MD  rosuvastatin  (CRESTOR ) 10 MG tablet TAKE ONE TABLET BY MOUTH ONCE DAILY 06/29/23  Yes [name], MD  solifenacin  (VESICARE ) 5 MG tablet Take 1 tablet (5 mg total) by mouth daily. 11/06/23  Yes [name], MD  traZODone  (DESYREL ) 50 MG tablet TAKE 1/2 TO 1 TABLET BY MOUTH AT BEDTIME AS NEEDED FOR SLEEP 07/24/23  Yes [name], MD  vitamin B-12 (CYANOCOBALAMIN ) 1000 MCG tablet Take 1,000 mcg by mouth daily.   Yes [provider]  Fluticasone -Umeclidin-Vilant (TRELEGY ELLIPTA ) 200-62.5-25 MCG/ACT AEPB Inhale 1 puff into the lungs daily. 09/26/23   Tamea Dedra CROME, MD  ondansetron  (ZOFRAN ) 8 MG tablet One pill every 8 hours as needed for nausea/vomitting. Patient not taking: No sig reported 12/05/23   [name], MD     Critical care time: 70 minutes     Lonell Moose, AGNP  Pulmonary/Critical Care Pager [phone] (please enter 7 digits) PCCM Consult Pager [phone] (please enter 7 digits)

## 2024-01-15 NOTE — Progress Notes (Signed)
 This RN heard BiPAP alarm going off, went into room, pt had taken BiPAP off and vomited large amount of emesis onto self and gown. MD called and notified that patient was profusely vomiting at this time. Zofran  given x 1. Pt transferred to the ICU for emergent intubation.

## 2024-01-15 NOTE — Progress Notes (Signed)
 This RN arrived to pt's room and noticed abnormal breathing with ronchus breath sounds audible at bedside. Pt also had evidence of emesis on gown. Rapid response called, EKG, labs, chest XR, and continuous BiPAP ordered. Pt now at 98 % O2.

## 2024-01-15 NOTE — Progress Notes (Signed)
 Sarah Copping, MD [facility]   [address] [place], [place] [address] Phone: [phone] Fax : [phone]   Subjective: The patient has had complication of respiratory distress with intubation and what appears to be possible aspiration.  Her hemoglobin has been stable with her most recent hemoglobin of 9.5 but was 9.3 previously.  There is been no sign of any further bleeding.  The patient was set up for an EGD and colonoscopy for today.  This was due to the bleeding iron  deficiency anemia and a CT scan showing proctitis.   Objective: Vital signs in last 24 hours: Vitals:   01/15/24 0859 01/15/24 0900 01/15/24 1000 01/15/24 1100  BP:  139/62 120/72 (!) 160/72  Pulse: 96 95 93 (!) 113  Resp: 17 (!) 21 13 16   Temp:      TempSrc:      SpO2: (!) 87% (!) 88% (!) 89% 95%  Weight:      Height:       Weight change:   Intake/Output Summary (Last 24 hours) at 01/15/2024 1154 Last data filed at 01/15/2024 1117 Gross per 24 hour  Intake 960.32 ml  Output 350 ml  Net 610.32 ml     Exam: Heart:: Regular rate and rhythm or without murmur or extra heart sounds Lungs: Diminished breath sounds bilaterally Abdomen: soft, nontender, normal bowel sounds   Lab Results: @LABTEST2 @ Micro Results: Recent Results (from the past 240 hours)  Gastrointestinal Panel by PCR , Stool     Status: None   Collection Time: 01/09/24  5:50 PM   Specimen: Stool  Result Value Ref Range Status   Campylobacter species NOT DETECTED NOT DETECTED Final   Plesimonas shigelloides NOT DETECTED NOT DETECTED Final   Salmonella species NOT DETECTED NOT DETECTED Final   Yersinia enterocolitica NOT DETECTED NOT DETECTED Final   Vibrio species NOT DETECTED NOT DETECTED Final   Vibrio cholerae NOT DETECTED NOT DETECTED Final   Enteroaggregative E coli (EAEC) NOT DETECTED NOT DETECTED Final   Enteropathogenic E coli (EPEC) NOT DETECTED NOT DETECTED Final   Enterotoxigenic E coli (ETEC) NOT DETECTED NOT  DETECTED Final   Shiga like toxin producing E coli (STEC) NOT DETECTED NOT DETECTED Final   Shigella/Enteroinvasive E coli (EIEC) NOT DETECTED NOT DETECTED Final   Cryptosporidium NOT DETECTED NOT DETECTED Final   Cyclospora cayetanensis NOT DETECTED NOT DETECTED Final   Entamoeba histolytica NOT DETECTED NOT DETECTED Final   Giardia lamblia NOT DETECTED NOT DETECTED Final   Adenovirus F40/41 NOT DETECTED NOT DETECTED Final   Astrovirus NOT DETECTED NOT DETECTED Final   Norovirus GI/GII NOT DETECTED NOT DETECTED Final   Rotavirus A NOT DETECTED NOT DETECTED Final   Sapovirus (I, II, IV, and V) NOT DETECTED NOT DETECTED Final    Comment: Performed at [facility], [address] [place], [place] [address]  C Difficile Quick Screen w PCR reflex     Status: None   Collection Time: 01/09/24  5:50 PM   Specimen: Stool  Result Value Ref Range Status   C Diff antigen NEGATIVE NEGATIVE Final   C Diff toxin NEGATIVE NEGATIVE Final   C Diff interpretation No C. difficile detected.  Final    Comment: Performed at [facility], [address] [place], [place] [address]  MRSA Next Gen by PCR, Nasal     Status: None   Collection Time: 01/15/24  6:01 AM   Specimen: Nasal Mucosa; Nasal Swab  Result Value Ref Range  Status   MRSA by PCR Next Gen NOT DETECTED NOT DETECTED Final    Comment: (NOTE) The GeneXpert MRSA Assay (FDA approved for NASAL specimens only), is one component of a comprehensive MRSA colonization surveillance program. It is not intended to diagnose MRSA infection nor to guide or monitor treatment for MRSA infections. Test performance is not FDA approved in patients less than 64 years old. Performed at [facility], [address] [place], [place] [address]    Studies/Results: CT CHEST WO CONTRAST Result Date: 01/15/2024 CLINICAL DATA:  Myelodysplastic syndrome. Acute respiratory failure. * Tracking Code: BO * EXAM: CT CHEST WITHOUT CONTRAST  TECHNIQUE: Multidetector CT imaging of the chest was performed following the standard protocol without IV contrast. RADIATION DOSE REDUCTION: This exam was performed according to the departmental dose-optimization program which includes automated exposure control, adjustment of the mA and/or kV according to patient size and/or use of iterative reconstruction technique. COMPARISON:  01/15/2024 chest radiograph and CT chest 08/15/2022 FINDINGS: Cardiovascular: Atheromatous vascular calcifications involving the thoracic aorta and left anterior descending coronary artery. Mitral valve calcification. Moderate pericardial effusion, similar to minimally increased from 08/15/2022. Mild cardiomegaly. Mediastinum/Nodes: Nasogastric tube enters the stomach. Upper normal sized paratracheal and AP window lymph nodes. Hilar adenopathy if present is obscured by motion artifact and lack of IV contrast. Lungs/Pleura: Endotracheal tube satisfactorily positioned. Below the endotracheal tube there is substantial narrowing of the tracheal lumen and proximal main stem bronchial lumens probably from a mucus plug and less likely due to tracheobronchomalacia. Differentiation is difficult due to motion artifact. Bilateral airspace opacities with air bronchograms in all lobes of the lung but with consolidation greatest in the lung bases. Vertical gradient suggests at least a component of acute pulmonary edema, potentially superimposed on aspiration pneumonitis. Upper Abdomen: Lateral segment left hepatic lobe cyst not changed from 08/25/2021 MRI. Abdominal aortic atherosclerosis. Benign peripelvic renal cysts warrant no further imaging workup. Moderately dilated fluid-filled colon. Musculoskeletal: Lower cervical plate and screw fixator. Thoracic spondylosis. IMPRESSION: 1. Below the endotracheal tube tip, there is substantial narrowing of the tracheal lumen and proximal main stem bronchial lumens probably from a mucus plug and less likely due to  tracheobronchomalacia. Consider deep suctioning attempt. 2. Bilateral airspace opacities with air bronchograms in all lobes of the lung but with consolidation greatest in the lung bases. Vertical gradient suggests at least a component of acute pulmonary edema, potentially superimposed on aspiration pneumonitis. 3. Moderate pericardial effusion, similar to minimally increased from 08/15/2022. 4. Mild cardiomegaly. 5. Moderately dilated fluid-filled colon. 6.  Aortic Atherosclerosis (ICD10-I70.0). Electronically Signed   By: Ryan Salvage M.D.   On: 01/15/2024 10:42   DG Abd 1 View Result Date: 01/15/2024 EXAM: 1 VIEW XRAY OF THE ABDOMEN 01/15/2024 06:01:46 AM COMPARISON: 01/14/2024 CLINICAL HISTORY: Encounter for nasogastric (NG) tube placement [id number]. POST INTUBATION ET TUBE/OG TUBE PLACEMENT. FINDINGS: LINES, TUBES AND DEVICES: Enteric tube in place with tip and side port overlying the expected region of the gastric lumen. Right mainstem bronchus intubation. BOWEL: Gaseous distension of the transverse colon. SOFT TISSUES: No opaque urinary calculi. BONES: No acute osseous abnormality. IMPRESSION: 1. Enteric tube in place with tip and side port overlying the expected region of the gastric lumen. 2. Right mainstem bronchus intubation. Electronically signed by: Waddell Calk MD 01/15/2024 06:07 AM EDT RP Workstation: [id number]   Portable Chest x-ray Result Date: 01/15/2024 EXAM: 1 VIEW XRAY OF THE CHEST 01/15/2024 06:01:46 AM COMPARISON: 01/15/2024 02:47 AM CLINICAL HISTORY: [id number] Endotracheal tube present [id number]. POST  INTUBATION ET TUBE/OG TUBE PLACEMENT FINDINGS: LINES, TUBES AND DEVICES: Interval placement of ET tube with tip in the right mainstem bronchus. Enteric tube tip and sideboard are well below the GE junction. LUNGS AND PLEURA: Persistent and progressive bilateral mid and lower lung zone opacities. HEART AND MEDIASTINUM: Stable cardiac enlargement. BONES AND SOFT TISSUES: No acute osseous  abnormality. IMPRESSION: 1. Findings consistent with right mainstem endotracheal intubation. 2. Enteric tube tip and side-port are below the GE junction. 3. Persistent and progressive bilateral mid and lower lung zone opacities. 4. Stable cardiac enlargement. 5. The urgent finding will be called to the ordering provider by the Professional Radiology Assistants (PRAs) and documented in the [facility] dashboard. Electronically signed by: Waddell Calk MD 01/15/2024 06:05 AM EDT RP Workstation: [id number]   DG Chest 1 View Result Date: 01/15/2024 EXAM: 1 VIEW XRAY OF THE CHEST 01/15/2024 03:05:25 AM COMPARISON: 01/12/2024 CLINICAL HISTORY: Dyspnea FINDINGS: LUNGS AND PLEURA: Increased interstitial markings, favoring mild-to-moderate interstitial edema, right perihilar predominant. No focal pulmonary opacity. No pleural effusion. No pneumothorax. HEART AND MEDIASTINUM: Cardiomegaly, unchanged. No acute abnormality of the mediastinal silhouette. BONES AND SOFT TISSUES: Cervical fusion hardware. No acute osseous abnormality. IMPRESSION: 1. New mild-to-moderate interstitial edema. 2. Cardiomegaly. Electronically signed by: Pinkie Pebbles MD 01/15/2024 03:12 AM EDT RP Workstation: [id number]   DG Abd 1 View Result Date: 01/14/2024 CLINICAL DATA:  Abdominal pain EXAM: ABDOMEN - 1 VIEW COMPARISON:  CT 01/12/2024 FINDINGS: Gaseous distension of bowel centrally in the abdomen felt to most likely reflect the cecum. No small bowel distention. No organomegaly or free air. No suspicious calcification. IMPRESSION: Nonspecific, nonobstructive bowel gas pattern. Mild gaseous distention of the cecum. Electronically Signed   By: Franky Crease M.D.   On: 01/14/2024 17:06   Medications: I have reviewed the patient's current medications. Scheduled Meds:  amLODipine   5 mg Oral Daily   Chlorhexidine  Gluconate Cloth  6 each Topical Daily   citalopram   20 mg Oral Daily   furosemide   40 mg Intravenous Q12H   insulin  aspart  0-9 Units  Subcutaneous Q4H   ipratropium-albuterol   3 mL Nebulization Q6H   ipratropium-albuterol   3 mL Nebulization Once   levothyroxine   50 mcg Oral Q0600   loratadine   10 mg Oral Daily   midazolam        midazolam   2 mg Intravenous Once   multivitamin with minerals  1 tablet Oral Daily   pantoprazole  (PROTONIX ) IV  40 mg Intravenous Q12H   Continuous Infusions:  piperacillin -tazobactam (ZOSYN )  IV     propofol  (DIPRIVAN ) infusion 5 mcg/kg/min (01/15/24 0600)   PRN Meds:.acetaminophen  **OR** acetaminophen , albuterol , fentaNYL  (SUBLIMAZE ) injection, guaiFENesin -dextromethorphan , metoCLOPramide  (REGLAN ) injection, midazolam , ondansetron  (ZOFRAN ) IV, ondansetron  **OR** [DISCONTINUED] ondansetron  (ZOFRAN ) IV, polyethylene glycol powder   Assessment: Principal Problem:   Proctocolitis    Plan: The patient was supposed to have an EGD and colonoscopy today but her hospital stay has been complicated with aspiration and intubation.  The patient is stable from a GI point of view and we will hold off on any endoscopic procedures at this time with the patient's transfer to the ICU and acute medical issues that should be resolved prior to the patient undergoing the procedures.  I have spoken to the ICU team about this and they agree with the plan.   LOS: 3 days   Sarah Copping, MD.FACG 01/15/2024, 11:54 AM Pager [phone] 7am-5pm  Check AMION for 5pm -7am coverage and on weekends

## 2024-01-15 NOTE — Progress Notes (Addendum)
 SLP Cancellation Note  Patient Details Name: Sarah [name] MRN: [id number] DOB: 1951/02/16   Cancelled treatment:       Reason Eval/Treat Not Completed: Medical issues which prohibited therapy;Patient not medically ready (chart reviewed)    Per chart review, pt is currently intubated s/p potential aspiration event- noted pt had N/V, emesis events followed by respiratory distress.  Per chart, pt admitted to [facility] ER on 09/19 with c/o LLQ abdominal pain with black stool onset several days prior to presentation.  Per ED notes on 09/16 pt was sent over from the cancer center to the ER with abdominal pain and dark tarry stools.  CT imaging concerning for proctosigmoid colitis..   GI is following pt.   ST services will sign off at this time. MD to reconsult if ST needs post extubation.      Comer Portugal, MS, CCC-SLP Speech Language Pathologist Rehab Services; [facility] [phone] (ascom) Svara Twyman 01/15/2024, 1:52 PM

## 2024-01-15 NOTE — Progress Notes (Signed)
 Critical care note:  Date of note 01/15/2024.  Subjective: The patient has been having worsening dyspnea and acute hypoxemia with pulse oximetry of 87% on room air and on nonrebreather it came up only 90%.  The patient just finished bowel prep.  She was placed on BiPAP and initially tolerated then had vomiting and significant respiratory distress on BiPAP.  BiPAP was removed and the patient was placed on nonrebreather.  Objective: Physical examination: Generally: Acutely ill elderly Caucasian female in moderate respiratory distress with decreased responsiveness and lethargy. Vital signs: BP was 134/72 with a heart rate of 98 and temperature 97.7 axillary possibility was 98% on 100% FiO2 on BiPAP. Head - atraumatic, normocephalic.  Pupils - equal, round and reactive to light and accommodation. Extraocular movements are intact. No scleral icterus.  Oropharynx - moist mucous membranes and tongue. No pharyngeal erythema or exudate.  Neck - supple. No JVD. Carotid pulses 2+ bilaterally. No carotid bruits. No palpable thyromegaly or lymphadenopathy. Cardiovascular - regular rate and rhythm. Normal S1 and S2. No murmurs, gallops or rubs.  Lungs -diminished bibasilar and midlung zone breath sounds with bibasal midlung zone crackles and sonorous rhonchi. Abdomen - soft and nontender. Positive bowel sounds. No palpable organomegaly or masses.  Extremities - no pitting edema, clubbing or cyanosis.  Neuro - grossly non-focal. Skin - no rashes. Breast, pelvic and rectal - deferred.   Labs and notes were reviewed. Stable chest x-ray showed new mild to moderate interstitial edema with bilateral pulmonary opacities and cardiomegaly.  Assessment/plan: 1.  Acute respiratory failure with hypoxia likely secondary to aspiration pneumonia with mild interstitial edema suggesting possibly acute cor pulmonale. - The patient was immediately transferred to an ICU bed. - She was given preintubation sedation with IV  fentanyl , etomidate  and Norcuron, and intubated by the ICU team and placed on mechanical ventilation. - She was ordered 40 mg of IV Lasix . - The ICU team will assume the care.  Authorized and performed by: Madison Peaches, MD Total critical care time:    35    minutes. Due to a high probability of clinically significant, life-threatening deterioration, the patient required my highest level of preparedness to intervene emergently and I personally spent this critical care time directly and personally managing the patient.  This critical care time included obtaining a history, examining the patient, pulse oximetry, ordering and review of studies, arranging urgent treatment with development of management plan, evaluation of patient's response to treatment, frequent reassessment, and discussions with other providers. This critical care time was performed to assess and manage the high probability of imminent, life-threatening deterioration that could result in multiorgan failure.  It was exclusive of separately billable procedures and treating other patients and teaching time.

## 2024-01-16 ENCOUNTER — Inpatient Hospital Stay

## 2024-01-16 ENCOUNTER — Inpatient Hospital Stay: Admitting: Internal Medicine

## 2024-01-16 ENCOUNTER — Telehealth: Payer: Self-pay | Admitting: Internal Medicine

## 2024-01-16 DIAGNOSIS — N179 Acute kidney failure, unspecified: Secondary | ICD-10-CM | POA: Diagnosis not present

## 2024-01-16 DIAGNOSIS — J189 Pneumonia, unspecified organism: Secondary | ICD-10-CM | POA: Diagnosis not present

## 2024-01-16 DIAGNOSIS — K529 Noninfective gastroenteritis and colitis, unspecified: Secondary | ICD-10-CM | POA: Diagnosis not present

## 2024-01-16 DIAGNOSIS — J9601 Acute respiratory failure with hypoxia: Secondary | ICD-10-CM | POA: Diagnosis not present

## 2024-01-16 LAB — COMPREHENSIVE METABOLIC PANEL WITH GFR
ALT: 11 U/L (ref 0–44)
AST: 15 U/L (ref 15–41)
Albumin: 3.2 g/dL — ABNORMAL LOW (ref 3.5–5.0)
Alkaline Phosphatase: 55 U/L (ref 38–126)
Anion gap: 12 (ref 5–15)
BUN: 16 mg/dL (ref 8–23)
CO2: 28 mmol/L (ref 22–32)
Calcium: 8.9 mg/dL (ref 8.9–10.3)
Chloride: 100 mmol/L (ref 98–111)
Creatinine, Ser: 1.43 mg/dL — ABNORMAL HIGH (ref 0.44–1.00)
GFR, Estimated: 38 mL/min — ABNORMAL LOW (ref >60)
Glucose, Bld: 176 mg/dL — ABNORMAL HIGH (ref 70–99)
Potassium: 3 mmol/L — ABNORMAL LOW (ref 3.5–5.1)
Sodium: 140 mmol/L (ref 135–145)
Total Bilirubin: 1 mg/dL (ref 0.0–1.2)
Total Protein: 6.6 g/dL (ref 6.5–8.1)

## 2024-01-16 LAB — HEMOGLOBIN AND HEMATOCRIT, BLOOD
HCT: 24.1 % — ABNORMAL LOW (ref 36.0–46.0)
Hemoglobin: 7.8 g/dL — ABNORMAL LOW (ref 12.0–15.0)

## 2024-01-16 LAB — CBC WITH DIFFERENTIAL/PLATELET
Basophils Absolute: 0.1 K/uL (ref 0.0–0.1)
Basophils Relative: 1 %
Eosinophils Absolute: 0.1 K/uL (ref 0.0–0.5)
Eosinophils Relative: 1 %
HCT: 25.7 % — ABNORMAL LOW (ref 36.0–46.0)
Hemoglobin: 8.2 g/dL — ABNORMAL LOW (ref 12.0–15.0)
Lymphocytes Relative: 5 %
Lymphs Abs: 0.5 K/uL — ABNORMAL LOW (ref 0.7–4.0)
MCH: 35.7 pg — ABNORMAL HIGH (ref 26.0–34.0)
MCHC: 31.9 g/dL (ref 30.0–36.0)
MCV: 111.7 fL — ABNORMAL HIGH (ref 80.0–100.0)
Monocytes Absolute: 0.7 K/uL (ref 0.1–1.0)
Monocytes Relative: 6 %
Neutro Abs: 9.6 K/uL — ABNORMAL HIGH (ref 1.7–7.7)
Neutrophils Relative %: 87 %
Platelets: 145 K/uL — ABNORMAL LOW (ref 150–400)
RBC: 2.3 MIL/uL — ABNORMAL LOW (ref 3.87–5.11)
RDW: 14.9 % (ref 11.5–15.5)
Smear Review: NORMAL
WBC: 11.1 K/uL — ABNORMAL HIGH (ref 4.0–10.5)
nRBC: 0 % (ref 0.0–0.2)

## 2024-01-16 LAB — GLUCOSE, CAPILLARY
Glucose-Capillary: 106 mg/dL — ABNORMAL HIGH (ref 70–99)
Glucose-Capillary: 122 mg/dL — ABNORMAL HIGH (ref 70–99)
Glucose-Capillary: 131 mg/dL — ABNORMAL HIGH (ref 70–99)
Glucose-Capillary: 132 mg/dL — ABNORMAL HIGH (ref 70–99)
Glucose-Capillary: 135 mg/dL — ABNORMAL HIGH (ref 70–99)
Glucose-Capillary: 178 mg/dL — ABNORMAL HIGH (ref 70–99)

## 2024-01-16 LAB — CBC
HCT: 23.7 % — ABNORMAL LOW (ref 36.0–46.0)
Hemoglobin: 7.6 g/dL — ABNORMAL LOW (ref 12.0–15.0)
MCH: 35.5 pg — ABNORMAL HIGH (ref 26.0–34.0)
MCHC: 32.1 g/dL (ref 30.0–36.0)
MCV: 110.7 fL — ABNORMAL HIGH (ref 80.0–100.0)
Platelets: 150 K/uL (ref 150–400)
RBC: 2.14 MIL/uL — ABNORMAL LOW (ref 3.87–5.11)
RDW: 14.9 % (ref 11.5–15.5)
WBC: 10.1 K/uL (ref 4.0–10.5)
nRBC: 0 % (ref 0.0–0.2)

## 2024-01-16 LAB — BASIC METABOLIC PANEL WITH GFR
Anion gap: 9 (ref 5–15)
BUN: 19 mg/dL (ref 8–23)
CO2: 28 mmol/L (ref 22–32)
Calcium: 8.7 mg/dL — ABNORMAL LOW (ref 8.9–10.3)
Chloride: 101 mmol/L (ref 98–111)
Creatinine, Ser: 1.54 mg/dL — ABNORMAL HIGH (ref 0.44–1.00)
GFR, Estimated: 35 mL/min — ABNORMAL LOW (ref >60)
Glucose, Bld: 127 mg/dL — ABNORMAL HIGH (ref 70–99)
Potassium: 3.6 mmol/L (ref 3.5–5.1)
Sodium: 138 mmol/L (ref 135–145)

## 2024-01-16 LAB — PHOSPHORUS: Phosphorus: 3.6 mg/dL (ref 2.5–4.6)

## 2024-01-16 LAB — TRIGLYCERIDES: Triglycerides: 52 mg/dL (ref <150)

## 2024-01-16 LAB — MAGNESIUM: Magnesium: 2 mg/dL (ref 1.7–2.4)

## 2024-01-16 MED ORDER — DEXMEDETOMIDINE HCL IN NACL 400 MCG/100ML IV SOLN
INTRAVENOUS | Status: AC
  Filled 2024-01-16: qty 100

## 2024-01-16 MED ORDER — SODIUM CHLORIDE 3 % IN NEBU
4.0000 mL | INHALATION_SOLUTION | Freq: Three times a day (TID) | RESPIRATORY_TRACT | Status: DC
  Administered 2024-01-16: 4 mL via RESPIRATORY_TRACT
  Filled 2024-01-16 (×2): qty 4

## 2024-01-16 MED ORDER — LORATADINE 10 MG PO TABS
10.0000 mg | ORAL_TABLET | Freq: Every day | ORAL | Status: DC
  Administered 2024-01-17 – 2024-01-18 (×2): 10 mg
  Filled 2024-01-16 (×2): qty 1

## 2024-01-16 MED ORDER — HEPARIN SODIUM (PORCINE) 5000 UNIT/ML IJ SOLN
5000.0000 [IU] | Freq: Three times a day (TID) | INTRAMUSCULAR | Status: DC
  Administered 2024-01-16 – 2024-01-22 (×18): 5000 [IU] via SUBCUTANEOUS
  Filled 2024-01-16 (×18): qty 1

## 2024-01-16 MED ORDER — LEVOTHYROXINE SODIUM 50 MCG PO TABS
50.0000 ug | ORAL_TABLET | Freq: Every day | ORAL | Status: DC
Start: 2024-01-16 — End: 2024-01-18
  Administered 2024-01-16 – 2024-01-18 (×2): 50 ug
  Filled 2024-01-16: qty 1

## 2024-01-16 MED ORDER — POTASSIUM CHLORIDE 10 MEQ/100ML IV SOLN
10.0000 meq | INTRAVENOUS | Status: AC
  Administered 2024-01-16 (×3): 10 meq via INTRAVENOUS
  Filled 2024-01-16 (×3): qty 100

## 2024-01-16 MED ORDER — CITALOPRAM HYDROBROMIDE 20 MG PO TABS
20.0000 mg | ORAL_TABLET | Freq: Every day | ORAL | Status: DC
  Administered 2024-01-17 – 2024-01-18 (×2): 20 mg
  Filled 2024-01-16 (×2): qty 1

## 2024-01-16 MED ORDER — ENSURE PLUS HIGH PROTEIN PO LIQD
237.0000 mL | Freq: Two times a day (BID) | ORAL | Status: DC
  Administered 2024-01-17 – 2024-01-19 (×2): 237 mL via ORAL

## 2024-01-16 MED ORDER — DEXMEDETOMIDINE HCL IN NACL 400 MCG/100ML IV SOLN
0.0000 ug/kg/h | INTRAVENOUS | Status: DC
  Administered 2024-01-16: 0.4 ug/kg/h via INTRAVENOUS

## 2024-01-16 MED ORDER — SODIUM CHLORIDE 3 % IN NEBU
4.0000 mL | INHALATION_SOLUTION | Freq: Four times a day (QID) | RESPIRATORY_TRACT | Status: AC
  Administered 2024-01-16 – 2024-01-18 (×8): 4 mL via RESPIRATORY_TRACT
  Filled 2024-01-16 (×8): qty 4

## 2024-01-16 MED ORDER — AMLODIPINE BESYLATE 5 MG PO TABS
5.0000 mg | ORAL_TABLET | Freq: Every day | ORAL | Status: DC
  Administered 2024-01-17: 5 mg
  Filled 2024-01-16 (×2): qty 1

## 2024-01-16 MED ORDER — ADULT MULTIVITAMIN W/MINERALS CH
1.0000 | ORAL_TABLET | Freq: Every day | ORAL | Status: DC
  Administered 2024-01-17 – 2024-01-18 (×2): 1
  Filled 2024-01-16 (×2): qty 1

## 2024-01-16 NOTE — Telephone Encounter (Signed)
 Checked on patient-unfortunately patient had a complicated hospital course with aspiration pneumonia s/p intubation-currently extubated.  Hemoglobin around 8/endoscopies on hold given the acute events.  Discussed with the patient's friend by the bedside.  Will plan outpatient follow-up at discharge-GB

## 2024-01-16 NOTE — Progress Notes (Signed)
 Nutrition Follow-up  DOCUMENTATION CODES:   Obesity unspecified  INTERVENTION:   -Liberalize diet to regular for widest variety of meal selections -MVI with minerals daily -Ensure Plus High Protein po BID, each supplement provides 350 kcal and 20 grams of protein   NUTRITION DIAGNOSIS:   Inadequate oral intake related to inability to eat as evidenced by NPO status.  Progressing; advanced to PO diet on 01/16/24  GOAL:   Patient will meet greater than or equal to 90% of their needs  Progressing   MONITOR:   PO intake, Supplement acceptance  REASON FOR ASSESSMENT:   Ventilator    ASSESSMENT:   Pt first seen on 09/19 with c/o LLQ abdominal pain with black stool onset several days prior to presentation.  Per ED notes on 09/16 pt was sent over from the cancer center to the ER with abdominal pain and dark tarry stools.  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  She received metronidazole  and ceftriaxone .  EDP recommended hospital admission, however pt refused.  She was discharged from the ER and prescribed Augmentin  and instructed to follow-up with her PCP.  9/22- s/p bronchoscopy 9/23- extubated, advanced to heart healthy diet  Reviewed I/O's: -2.2 L x 24 hours and +547 ml since admission  UOP: 2.9 L x 24 hours  Case discussed with RN, MD, and ICU rounds. Plan to extubate today.   Noted pt was extubated after RD visited pt and advanced to a heart healthy duet.   Medications reviewed and include celexa , lasix , sol-medrol , protonix , and zosyn .   Labs reviewed: K: 3.0, CBGS: 106-197 (inpatient orders for glycemic control are 0-9 units insulin  aspart every 4 hours).    NUTRITION - FOCUSED PHYSICAL EXAM:  Flowsheet Row Most Recent Value  Orbital Region No depletion  Upper Arm Region No depletion  Thoracic and Lumbar Region No depletion  Buccal Region No depletion  Temple Region No depletion  Clavicle Bone Region No  depletion  Clavicle and Acromion Bone Region No depletion  Scapular Bone Region No depletion  Dorsal Hand No depletion  Patellar Region No depletion  Anterior Thigh Region No depletion  Posterior Calf Region No depletion  Edema (RD Assessment) None  Hair Reviewed  Eyes Reviewed  Mouth Reviewed  Skin Reviewed  Nails Reviewed    Diet Order:   Diet Order             Diet Heart Room service appropriate? Yes; Fluid consistency: Thin  Diet effective now                   EDUCATION NEEDS:   Not appropriate for education at this time  Skin:  Skin Assessment: Reviewed RN Assessment  Last BM:  01/16/24  Height:   Ht Readings from Last 1 Encounters:  01/12/24 5' 3 (1.6 m)    Weight:   Wt Readings from Last 1 Encounters:  01/16/24 84.7 kg    Ideal Body Weight:  52.3 kg  BMI:  Body mass index is 33.08 kg/m.  Estimated Nutritional Needs:   Kcal:  1650-1850  Protein:  85-100 grams  Fluid:  1.6-1.8 L    Margery ORN, RD, LDN, CDCES Registered Dietitian III Certified Diabetes Care and Education Specialist If unable to reach this RD, please use RD Inpatient group chat on secure chat between hours of 8am-4 pm daily

## 2024-01-16 NOTE — Evaluation (Signed)
 Occupational Therapy Evaluation Patient Details Name: Sarah [name] MRN: [id number] DOB: 22-Mar-1951 Today's Date: 01/16/2024   History of Present Illness   Pt is a 73 y.o. female who presented to [facility] ER on 09/19 with c/o LLQ abdominal pain with black stool onset several days prior to presentation. Pt emergently intubated and transferred to ICU on 9/22 d/t large amounts of emesis and significant respiratory distress. Admitted for management of  Acute respiratory failure with hypoxia likely secondary to aspiration pneumonia with mild interstitial edema suggesting possibly acute cor pulmonale. Extubated 9/23. PMH of MDS, Asthma/bronchiectasis, GERD, HTN, Hypothyroidism, and HTN.  PMH: HTN, GERD, HTN, depression, OSA     Clinical Impressions Pt was seen for OT evaluation this date. PTA, pt resides in a one level home with 4 STE with a friend she helps take care of. At baseline, she is IND with all tasks, drives, etc. She has friends/neighbors available to assist as needed. Pt presents with deficits in strength, balance and activity tolerance, affecting safe and optimal ADL completion. Pt currently requires Mod A for bed mobility tasks with cues for technique/safety. She required Max A for peri-care at bed level and was able to roll to bil sides with CGA to supervision. Pt was limited by fatigue/being on bipap during session, but sp02 maintained 97-100%. She was able to follow one step directions consistently, but was hard to understand at times talking with the bipap on. Able to answer questions appropriately. Pt would benefit from skilled OT services to address noted impairments and functional limitations to maximize return to PLOF. Do anticipate the need for follow up OT services upon acute hospital DC.      If plan is discharge home, recommend the following:   A lot of help with walking and/or transfers;A lot of help with bathing/dressing/bathroom;Assist for transportation;Assistance with  cooking/housework;Help with stairs or ramp for entrance     Functional Status Assessment   Patient has had a recent decline in their functional status and demonstrates the ability to make significant improvements in function in a reasonable and predictable amount of time.     Equipment Recommendations   BSC/3in1;Other (comment) (defer to next venue)     Recommendations for Other Services         Precautions/Restrictions   Precautions Precautions: Fall Recall of Precautions/Restrictions: Intact Restrictions Weight Bearing Restrictions Per Provider Order: No     Mobility Bed Mobility Overal bed mobility: Needs Assistance Bed Mobility: Supine to Sit, Sit to Supine, Rolling Rolling: Supervision, Contact guard assist   Supine to sit: Mod assist, HOB elevated, Used rails Sit to supine: Mod assist   General bed mobility comments: pt initiated movement, however ultimately required assist for trunkal elevation to reach EOB and BLE management to return to supine; sat up ~1 min with stable 02 sats on bipap however noted with BM and returned to supine for clean up    Transfers                          Balance Overall balance assessment: Needs assistance Sitting-balance support: Feet supported, Bilateral upper extremity supported Sitting balance-Leahy Scale: Fair         Standing balance comment: deferred d/t bipap and BM                           ADL either performed or assessed with clinical judgement   ADL Overall ADL's : Needs  assistance/impaired                             Toileting- Architect and Hygiene: Maximal assistance;Bed level               Vision         Perception         Praxis         Pertinent Vitals/Pain Pain Assessment Pain Assessment: No/denies pain     Extremity/Trunk Assessment Upper Extremity Assessment Upper Extremity Assessment: Overall WFL for tasks assessed   Lower  Extremity Assessment Lower Extremity Assessment: Generalized weakness       Communication     Cognition Arousal: Alert Behavior During Therapy: WFL for tasks assessed/performed Cognition: Difficult to assess Difficult to assess due to:  (on bipap)           OT - Cognition Comments: able to make needs known, stated she needed to poop, knew when she had gone, etc.; however talks tangential and hard to understand through bipap                 Following commands: Intact       Cueing  General Comments      VSS on bipap maintained sp02 97-100%; x2 incont BMs during session   Exercises Other Exercises Other Exercises: Edu on role of OT in acute setting and importance of getting OOB/EOB.   Shoulder Instructions      Home Living Family/patient expects to be discharged to:: Private residence Living Arrangements:  (friend that she takes care of?) Available Help at Discharge: Available PRN/intermittently;Available 24 hours/day;Neighbor;Friend(s) Type of Home: Mobile home Home Access: Stairs to enter Entrance Stairs-Number of Steps: 4 Entrance Stairs-Rails: Can reach both;Left;Right Home Layout: One level     Bathroom Shower/Tub: Tub/shower unit;Walk-in shower   Bathroom Toilet: Handicapped height Bathroom Accessibility: Yes How Accessible: Accessible via walker Home Equipment: Rolling Walker (2 wheels);Toilet riser;Cane - single point;Cane - quad;Shower seat;Grab bars - tub/shower          Prior Functioning/Environment Prior Level of Function : Independent/Modified Independent;Driving                    OT Problem List: Decreased strength;Decreased activity tolerance;Impaired balance (sitting and/or standing)   OT Treatment/Interventions: Self-care/ADL training;Therapeutic exercise;Therapeutic activities;Patient/family education;Balance training;Energy conservation;DME and/or AE instruction      OT Goals(Current goals can be found in the care plan  section)   Acute Rehab OT Goals Patient Stated Goal: improve breathing OT Goal Formulation: With patient/family Time For Goal Achievement: 01/30/24 Potential to Achieve Goals: Good ADL Goals Pt Will Perform Lower Body Bathing: with contact guard assist;with supervision;sitting/lateral leans;sit to/from stand;with adaptive equipment Pt Will Perform Lower Body Dressing: with contact guard assist;with supervision;sitting/lateral leans;sit to/from stand Pt Will Transfer to Toilet: with contact guard assist;with supervision;ambulating Additional ADL Goal #1: Pt will demo implementation of 1 learned ECS during ADL performance to maximize safety/IND and prevent overexertion.   OT Frequency:  Min 2X/week    Co-evaluation              AM-PAC OT 6 Clicks Daily Activity     Outcome Measure Help from another person eating meals?: A Little Help from another person taking care of personal grooming?: A Little Help from another person toileting, which includes using toliet, bedpan, or urinal?: A Lot Help from another person bathing (including washing, rinsing, drying)?: A Lot Help from  another person to put on and taking off regular upper body clothing?: A Little Help from another person to put on and taking off regular lower body clothing?: A Lot 6 Click Score: 15   End of Session Equipment Utilized During Treatment: Oxygen (bipap) Nurse Communication: Mobility status  Activity Tolerance: Patient tolerated treatment well Patient left: in bed;with call bell/phone within reach;with bed alarm set;with family/visitor present  OT Visit Diagnosis: Other abnormalities of gait and mobility (R26.89);Muscle weakness (generalized) (M62.81)                Time: 8540-8473 OT Time Calculation (min): 27 min Charges:  OT General Charges $OT Visit: 1 Visit OT Evaluation $OT Eval Low Complexity: 1 Low OT Treatments $Self Care/Home Management : 8-22 mins Judeth Gilles, OTR/L  01/16/24, 5:46  PM  Duwaine FORBES Saupe 01/16/2024, 5:43 PM

## 2024-01-16 NOTE — Progress Notes (Signed)
 NAME:  Sarah [name], MRN:  [id number], DOB:  1950/12/20, LOS: 4 ADMISSION DATE:  01/12/2024 History of Present Illness:  This is a 73 yo female who presented to [facility] ER on 09/19 with c/o LLQ abdominal pain with black stool onset several days prior to presentation.  Per ED notes on 09/16 pt was sent over from the cancer center to the ER with abdominal pain and dark tarry stools.  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  She received metronidazole  and ceftriaxone .  EDP recommended hospital admission, however pt refused.  She was discharged from the ER and prescribed Augmentin  and instructed to follow-up with her PCP.     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  However, treatment put on hold since 09/18 per oncologist recommendations due to colitis dx.  She has chronic dyspnea at baseline, but reports this has worsened along with worsening bilateral lower extremity edema she suspected this is due to chemotherapy treatment.    ED Course  Upon arrival to the ER during this presentation pt reported worsening abdominal pain, nausea, and poor po intake despite compliance with Augmentin .  Significant lab results: K+ 3.3/calcium  8.6/total bilirubin 1.4/hgb 9.5/platelet count 126.  CXR negative for acute cardiopulmonary disease.  Pt received ceftriaxone /metronidazole /4 mg of iv morphine /1L NS bolus.  Pt admitted to the [facility] per hospitalist team for additional workup and treatment.    Pertinent  Medical History  Arthritis  Asthma  Depression  Dyspnea GERD HLD HTN  Hypothyroidism  CML    Significant Hospital Events: Including procedures, antibiotic start and stop dates in addition to other pertinent events   09/19: Admitted to the medsurg unit with acute GI bleed and proctocolitis  09/20: GI consulted and scheduled EGD and colonoscopy with possible biopsy on 09/22 09/22: Pt ingested bowel prep  overnight and developed acute hypoxic respiratory failure secondary to aspiration following multiple episodes of emesis requiring transfer to ICU and mechanical intubation   Interim History / Subjective:  Patient doing well on SBT. Mental status is fine.   Objective    Blood pressure (!) 103/56, pulse 71, temperature 98.6 F (37 C), temperature source Oral, resp. rate 19, height 5' 3 (1.6 m), weight 84.7 kg, SpO2 100%.    Vent Mode: PRVC FiO2 (%):  [60 %] 60 % Set Rate:  [20 bmp] 20 bmp Vt Set:  [450 mL] 450 mL PEEP:  [8 cmH20] 8 cmH20 Plateau Pressure:  [15 cmH20-27 cmH20] 26 cmH20   Intake/Output Summary (Last 24 hours) at 01/16/2024 1523 Last data filed at 01/16/2024 0853 Gross per 24 hour  Intake 777.54 ml  Output 2550 ml  Net -1772.46 ml   Filed Weights   01/12/24 1229 01/12/24 1832 01/16/24 0500  Weight: 86.2 kg 84.1 kg 84.7 kg    Examination: General: NAD, following commands, doing well on SBT. HENT: Supple neck, reactive pupils  Lungs: Coarse breath sounds bilaterally Cardiovascular: Normal S1, Normal S2, RRR  Abdomen: Soft, non tender non disteded Extremities: Warm and well perfused no edema.   Labs and imaging were reviewed.   Assessment and Plan  #Acute hypoxic respiratory failure requring intubation and mechanical ventilation secondary to  #Aspiration pneumonia  #Severe EDAC on bronchoscopy  #Melena however H&H stable, endoscopy postpone given acute event.  #Hypothyroidism  #Asthma  #GERD #CML   N: DC analgo sedation - moving towards extubation. C/w Home Celexa .  C: c/w home amlodipine . Lasix  for one more  day.  L: Proceed with extubation. Duonebs Q6H. Hypertonic saline Q8H. Vest PT Q8H. C/w systemic steroids for a total of 5 days.  G: NPO for now. Speech eval. Advance diet carefully. Lax as needed.  R: Diuresis as above.  H: H&H  stable. Ok to restart chemoprophylaxis.  E: POC 140-180. Home levothyroxine  I: Zosyn  for a total of 5 days.   Critical  care time: 50 minutes    Darrin Barn, MD [facility] 01/16/2024 3:37 PM

## 2024-01-16 NOTE — Progress Notes (Signed)
 PHARMACY CONSULT NOTE - FOLLOW UP  Pharmacy Consult for Electrolyte Monitoring and Replacement   Recent Labs: Potassium (mmol/L)  Date Value  01/16/2024 3.0 (L)   Magnesium (mg/dL)  Date Value  90/76/7974 2.0   Calcium  (mg/dL)  Date Value  90/76/7974 8.9   Albumin (g/dL)  Date Value  90/76/7974 3.2 (L)   Phosphorus (mg/dL)  Date Value  90/76/7974 3.6   Sodium (mmol/L)  Date Value  01/16/2024 140     Assessment: 73 y.o. female with medical history significant of MDS, Asthma/bronchiectasis, GERD, HTN, Hypothyroidism, and HTN. Pharmacy is asked to follow and replace electrolytes while in CCU  Diuretics: furosemide  40 mg IV every 12 hours  Goal of Therapy:  Electrolytes WNL  Plan:  ---10 mEq IV KCl x 3 ---recheck electrolytes  in am  Sarah [name] ,PharmD Clinical Pharmacist 01/16/2024 7:09 AM

## 2024-01-16 NOTE — Progress Notes (Signed)
 Medications titrated per patients clinical requirements per MD verbal order.

## 2024-01-17 DIAGNOSIS — J9601 Acute respiratory failure with hypoxia: Secondary | ICD-10-CM | POA: Diagnosis not present

## 2024-01-17 DIAGNOSIS — J189 Pneumonia, unspecified organism: Secondary | ICD-10-CM | POA: Diagnosis not present

## 2024-01-17 DIAGNOSIS — K529 Noninfective gastroenteritis and colitis, unspecified: Secondary | ICD-10-CM | POA: Diagnosis not present

## 2024-01-17 DIAGNOSIS — N179 Acute kidney failure, unspecified: Secondary | ICD-10-CM | POA: Diagnosis not present

## 2024-01-17 LAB — CBC WITH DIFFERENTIAL/PLATELET
Abs Immature Granulocytes: 1.34 K/uL — ABNORMAL HIGH (ref 0.00–0.07)
Basophils Absolute: 0 K/uL (ref 0.0–0.1)
Basophils Relative: 0 %
Eosinophils Absolute: 0 K/uL (ref 0.0–0.5)
Eosinophils Relative: 0 %
HCT: 23.4 % — ABNORMAL LOW (ref 36.0–46.0)
Hemoglobin: 7.7 g/dL — ABNORMAL LOW (ref 12.0–15.0)
Immature Granulocytes: 9 %
Lymphocytes Relative: 5 %
Lymphs Abs: 0.7 K/uL (ref 0.7–4.0)
MCH: 36.8 pg — ABNORMAL HIGH (ref 26.0–34.0)
MCHC: 32.9 g/dL (ref 30.0–36.0)
MCV: 112 fL — ABNORMAL HIGH (ref 80.0–100.0)
Monocytes Absolute: 1 K/uL (ref 0.1–1.0)
Monocytes Relative: 7 %
Neutro Abs: 11.6 K/uL — ABNORMAL HIGH (ref 1.7–7.7)
Neutrophils Relative %: 79 %
Platelets: 157 K/uL (ref 150–400)
RBC: 2.09 MIL/uL — ABNORMAL LOW (ref 3.87–5.11)
RDW: 15.3 % (ref 11.5–15.5)
Smear Review: NORMAL
WBC: 14.7 K/uL — ABNORMAL HIGH (ref 4.0–10.5)
nRBC: 0 % (ref 0.0–0.2)

## 2024-01-17 LAB — HEMOGLOBIN AND HEMATOCRIT, BLOOD
HCT: 26.8 % — ABNORMAL LOW (ref 36.0–46.0)
HCT: 25.4 % — ABNORMAL LOW (ref 36.0–46.0)
HCT: 23.8 % — ABNORMAL LOW (ref 36.0–46.0)
Hemoglobin: 8.6 g/dL — ABNORMAL LOW (ref 12.0–15.0)
Hemoglobin: 8.2 g/dL — ABNORMAL LOW (ref 12.0–15.0)
Hemoglobin: 7.8 g/dL — ABNORMAL LOW (ref 12.0–15.0)

## 2024-01-17 LAB — COMPREHENSIVE METABOLIC PANEL WITH GFR
ALT: 11 U/L (ref 0–44)
AST: 17 U/L (ref 15–41)
Albumin: 2.8 g/dL — ABNORMAL LOW (ref 3.5–5.0)
Alkaline Phosphatase: 44 U/L (ref 38–126)
Anion gap: 12 (ref 5–15)
BUN: 21 mg/dL (ref 8–23)
CO2: 30 mmol/L (ref 22–32)
Calcium: 8.6 mg/dL — ABNORMAL LOW (ref 8.9–10.3)
Chloride: 99 mmol/L (ref 98–111)
Creatinine, Ser: 1.35 mg/dL — ABNORMAL HIGH (ref 0.44–1.00)
GFR, Estimated: 41 mL/min — ABNORMAL LOW (ref >60)
Glucose, Bld: 118 mg/dL — ABNORMAL HIGH (ref 70–99)
Potassium: 3.5 mmol/L (ref 3.5–5.1)
Sodium: 141 mmol/L (ref 135–145)
Total Bilirubin: 1 mg/dL (ref 0.0–1.2)
Total Protein: 6 g/dL — ABNORMAL LOW (ref 6.5–8.1)

## 2024-01-17 LAB — MAGNESIUM: Magnesium: 2 mg/dL (ref 1.7–2.4)

## 2024-01-17 LAB — GLUCOSE, CAPILLARY
Glucose-Capillary: 114 mg/dL — ABNORMAL HIGH (ref 70–99)
Glucose-Capillary: 92 mg/dL (ref 70–99)
Glucose-Capillary: 98 mg/dL (ref 70–99)
Glucose-Capillary: 154 mg/dL — ABNORMAL HIGH (ref 70–99)
Glucose-Capillary: 142 mg/dL — ABNORMAL HIGH (ref 70–99)

## 2024-01-17 LAB — PHOSPHORUS: Phosphorus: 4 mg/dL (ref 2.5–4.6)

## 2024-01-17 MED ORDER — ORAL CARE MOUTH RINSE: 15.0000 mL | OROMUCOSAL | Status: DC | PRN

## 2024-01-17 MED ORDER — STERILE WATER FOR INJECTION IJ SOLN
INTRAMUSCULAR | Status: AC
  Administered 2024-01-17: 10 mL
  Filled 2024-01-17: qty 10

## 2024-01-17 MED ORDER — FLEET ENEMA RE ENEM
1.0000 | ENEMA | Freq: Once | RECTAL | Status: AC
Start: 2024-01-17 — End: 2024-01-17
  Administered 2024-01-17: 1 via RECTAL

## 2024-01-17 MED ORDER — PIPERACILLIN-TAZOBACTAM 3.375 G IVPB
3.3750 g | Freq: Three times a day (TID) | INTRAVENOUS | Status: DC
  Administered 2024-01-17 – 2024-01-19 (×7): 3.375 g via INTRAVENOUS
  Filled 2024-01-17 (×7): qty 50

## 2024-01-17 NOTE — Plan of Care (Signed)

## 2024-01-17 NOTE — Evaluation (Signed)
 Physical Therapy Evaluation Patient Details Name: Sarah [name] MRN: [id number] DOB: [birth year] Today's Date: 01/17/2024  History of Present Illness  Pt is a 73 y/o F admitted on 01/12/24 after presenting with c/o LLQ abdominal pain with black stool. Pt with CT on 01/09/24 concerning for proctosigmoid colitis. GI was consulted & EGD & colonoscopy scheduled for 9/22. Pt ingested bowel prep over night & developed acute hypoxic respiratory failure 2/2 aspiration following multiple episodes of emesis requiring transfer to ICU & intubation; pt extubated 01/16/24. PMH: arthritis, asthma, depression, dyspnea, GERD, HLD, HTN, hypothyroidism, CML  Clinical Impression  Pt seen for PT evaluation with pt agreeable, friends & neighbor present. Pt reports prior to admission she was independent without AD, driving, denies falls. On this date, pt requires min assist for bed mobility, min assist for transfers. Pt tolerates standing & marching in place (unable to ambulate 2/2 limited HHFNC tubing); does have 1 LOB with mod assist to correct. Pt is motivated to get better & has good support from friend/roommate & neighbor who were present & involved in session. Will continue to follow pt acutely to progress mobility as able, recommend post acute rehab >3 hours therapy/day upon d/c.        If plan is discharge home, recommend the following: A lot of help with bathing/dressing/bathroom;Assistance with feeding;Assist for transportation;A little help with walking and/or transfers;Assistance with cooking/housework;Direct supervision/assist for medications management;Help with stairs or ramp for entrance;Direct supervision/assist for financial management   Can travel by private vehicle        Equipment Recommendations BSC/3in1;Rolling walker (2 wheels)  Recommendations for Other Services  Rehab consult    Functional Status Assessment Patient has had a recent decline in their functional status and demonstrates the  ability to make significant improvements in function in a reasonable and predictable amount of time.     Precautions / Restrictions Precautions Precautions: Fall Recall of Precautions/Restrictions: Intact Restrictions Weight Bearing Restrictions Per Provider Order: No      Mobility  Bed Mobility Overal bed mobility: Needs Assistance Bed Mobility: Sidelying to Sit   Sidelying to sit: Min assist, HOB elevated, Used rails       General bed mobility comments: cuing re: sequencing to come to sitting EOB    Transfers Overall transfer level: Needs assistance Equipment used: Rolling walker (2 wheels) Transfers: Sit to/from Stand, Bed to chair/wheelchair/BSC Sit to Stand: Min assist, +2 physical assistance, +2 safety/equipment   Step pivot transfers: Contact guard assist, Min assist, +2 safety/equipment, +2 physical assistance       General transfer comment: min assist +2 fade to min assist +1 for sit<>stand    Ambulation/Gait                  Stairs            Wheelchair Mobility     Tilt Bed    Modified Rankin (Stroke Patients Only)       Balance Overall balance assessment: Needs assistance Sitting-balance support: Feet supported, Bilateral upper extremity supported Sitting balance-Leahy Scale: Fair     Standing balance support: Bilateral upper extremity supported, Reliant on assistive device for balance Standing balance-Leahy Scale: Poor                               Pertinent Vitals/Pain Pain Assessment Pain Assessment: Faces Faces Pain Scale: Hurts little more Pain Location: buttocks with peri hygiene Pain Descriptors / Indicators: Grimacing, Discomfort  Pain Intervention(s): Monitored during session, Limited activity within patient's tolerance    Home Living Family/patient expects to be discharged to:: Private residence Living Arrangements:  (roommate?) Available Help at Discharge: Family;Friend(s);Neighbor Type of Home:  Mobile home Home Access: Stairs to enter Entrance Stairs-Rails: Can reach both;Left;Right Entrance Stairs-Number of Steps: 4   Home Layout: One level Home Equipment: Agricultural consultant (2 wheels);Toilet riser;Cane - single point;Cane - quad;Shower seat;Grab bars - tub/shower      Prior Function Prior Level of Function : Independent/Modified Independent;Driving             Mobility Comments: ambulatory without AD, denies falls, helps to take care of her roommate/friend       Extremity/Trunk Assessment   Upper Extremity Assessment Upper Extremity Assessment: Generalized weakness    Lower Extremity Assessment Lower Extremity Assessment: Generalized weakness       Communication   Communication Communication: No apparent difficulties    Cognition Arousal: Alert Behavior During Therapy: WFL for tasks assessed/performed   PT - Cognitive impairments: Safety/Judgement                         Following commands: Intact       Cueing Cueing Techniques: Verbal cues     General Comments General comments (skin integrity, edema, etc.): SPO2 87% or > on supplemental O2, pt with incontinent BM requiring total assist for peri hygiene, assisted on BSC at end of session    Exercises     Assessment/Plan    PT Assessment Patient needs continued PT services  PT Problem List Decreased strength;Cardiopulmonary status limiting activity;Decreased range of motion;Decreased activity tolerance;Decreased balance;Decreased mobility;Decreased knowledge of precautions;Decreased safety awareness;Decreased knowledge of use of DME       PT Treatment Interventions DME instruction;Balance training;Gait training;Neuromuscular re-education;Stair training;Functional mobility training;Therapeutic activities;Therapeutic exercise;Patient/family education    PT Goals (Current goals can be found in the Care Plan section)  Acute Rehab PT Goals Patient Stated Goal: get better PT Goal  Formulation: With patient/family Time For Goal Achievement: 01/31/24 Potential to Achieve Goals: Good    Frequency Min 3X/week     Co-evaluation PT/OT/SLP Co-Evaluation/Treatment: Yes Reason for Co-Treatment: Complexity of the patient's impairments (multi-system involvement);For patient/therapist safety;To address functional/ADL transfers PT goals addressed during session: Mobility/safety with mobility;Balance;Strengthening/ROM         AM-PAC PT 6 Clicks Mobility  Outcome Measure Help needed turning from your back to your side while in a flat bed without using bedrails?: A Little Help needed moving from lying on your back to sitting on the side of a flat bed without using bedrails?: A Lot Help needed moving to and from a bed to a chair (including a wheelchair)?: A Little Help needed standing up from a chair using your arms (e.g., wheelchair or bedside chair)?: A Little Help needed to walk in hospital room?: A Lot Help needed climbing 3-5 steps with a railing? : Total 6 Click Score: 14    End of Session Equipment Utilized During Treatment: Oxygen Activity Tolerance: Patient tolerated treatment well Patient left: with family/visitor present;with call bell/phone within reach (on [facility]) Nurse Communication: Mobility status PT Visit Diagnosis: Unsteadiness on feet (R26.81);Muscle weakness (generalized) (M62.81);Difficulty in walking, not elsewhere classified (R26.2);Other abnormalities of gait and mobility (R26.89)    Time: 1017-1040 PT Time Calculation (min) (ACUTE ONLY): 23 min   Charges:   PT Evaluation $PT Eval Moderate Complexity: 1 Mod   PT General Charges $$ ACUTE PT VISIT: 1 Visit  Richerd Pinal, PT, DPT 01/17/24, 11:01 AM   Richerd CHRISTELLA Pinal 01/17/2024, 10:58 AM

## 2024-01-17 NOTE — Progress Notes (Signed)
 Occupational Therapy Treatment Patient Details Name: Sarah [name] MRN: [id number] DOB: 1951-02-15 Today's Date: 01/17/2024   History of present illness Pt is a 73 y/o F admitted on 01/12/24 after presenting with c/o LLQ abdominal pain with black stool. Pt with CT on 01/09/24 concerning for proctosigmoid colitis. GI was consulted & EGD & colonoscopy scheduled for 9/22. Pt ingested bowel prep over night & developed acute hypoxic respiratory failure 2/2 aspiration following multiple episodes of emesis requiring transfer to ICU & intubation; pt extubated 01/16/24. PMH: arthritis, asthma, depression, dyspnea, GERD, HLD, HTN, hypothyroidism, CML   OT comments  Upon entering the room, pt supine in bed and agreeable to co-treatment with PT. Pt needing encouragement for mobility. Pt having been incontinent of BM in bed. Supine >sit with min A to EOB. Pt stands with min A of 2 and needing max A for peri hygiene and to don brief before taking several steps to recliner chair with RW. Pt then states need for BM and transferred to [facility] at end of session in same manner. Pt requesting to remain seated on commode chair. Friends present in room and RN notified as well. Call bell within reach of pt for her to call in a few minutes when done. Pt is far from baseline and on HHFNC during session which limited mobility as well.       If plan is discharge home, recommend the following:  A lot of help with walking and/or transfers;A lot of help with bathing/dressing/bathroom;Assist for transportation;Assistance with cooking/housework;Help with stairs or ramp for entrance   Equipment Recommendations  Other (comment) (defer to next venue of care)       Precautions / Restrictions Precautions Precautions: Fall       Mobility Bed Mobility Overal bed mobility: Needs Assistance Bed Mobility: Sidelying to Sit   Sidelying to sit: Min assist, HOB elevated, Used rails            Transfers Overall transfer level:  Needs assistance Equipment used: Rolling walker (2 wheels) Transfers: Sit to/from Stand, Bed to chair/wheelchair/BSC Sit to Stand: Min assist, +2 physical assistance, +2 safety/equipment     Step pivot transfers: Contact guard assist, Min assist, +2 safety/equipment, +2 physical assistance     General transfer comment: min assist +2 fade to min assist +1 for sit<>stand     Balance Overall balance assessment: Needs assistance Sitting-balance support: Feet supported, Bilateral upper extremity supported Sitting balance-Leahy Scale: Fair     Standing balance support: Bilateral upper extremity supported, Reliant on assistive device for balance, During functional activity Standing balance-Leahy Scale: Poor                             ADL either performed or assessed with clinical judgement   ADL Overall ADL's : Needs assistance/impaired                         Toilet Transfer: Minimal assistance;Moderate assistance;Rolling walker (2 wheels);BSC/3in1   Toileting- Clothing Manipulation and Hygiene: Maximal assistance;Sit to/from stand Toileting - Clothing Manipulation Details (indicate cue type and reason): for hygiene and clothing management            Extremity/Trunk Assessment Upper Extremity Assessment Upper Extremity Assessment: Generalized weakness   Lower Extremity Assessment Lower Extremity Assessment: Generalized weakness        Vision Baseline Vision/History: 1 Wears glasses Patient Visual Report: No change from baseline  Communication Communication Communication: No apparent difficulties   Cognition Arousal: Alert Behavior During Therapy: WFL for tasks assessed/performed Cognition: Cognition impaired   Orientation impairments: Time, Situation                           Following commands: Intact        Cueing   Cueing Techniques: Verbal cues        General Comments SPO2 87% or > on supplemental O2, pt  with incontinent BM requiring total assist for peri hygiene, assisted on BSC at end of session    Pertinent Vitals/ Pain       Pain Assessment Pain Assessment: Faces Faces Pain Scale: Hurts little more Pain Location: buttocks with peri hygiene Pain Descriptors / Indicators: Grimacing, Discomfort Pain Intervention(s): Monitored during session, Limited activity within patient's tolerance  Home Living Family/patient expects to be discharged to:: Private residence Living Arrangements:  (roommate?) Available Help at Discharge: Family;Friend(s);Neighbor Type of Home: Mobile home Home Access: Stairs to enter [employer] of Steps: 4 Entrance Stairs-Rails: Can reach both;Left;Right Home Layout: One level     Bathroom Shower/Tub: Tub/shower unit;Walk-in shower   Bathroom Toilet: Handicapped height     Home Equipment: Agricultural consultant (2 wheels);Toilet riser;Cane - single point;Cane - quad;Shower seat;Grab bars - tub/shower              Frequency  Min 2X/week        Progress Toward Goals  OT Goals(current goals can now be found in the care plan section)  Progress towards OT goals: Progressing toward goals         Co-evaluation    PT/OT/SLP Co-Evaluation/Treatment: Yes Reason for Co-Treatment: Complexity of the patient's impairments (multi-system involvement);For patient/therapist safety;To address functional/ADL transfers PT goals addressed during session: Mobility/safety with mobility;Balance;Strengthening/ROM OT goals addressed during session: ADL's and self-care      AM-PAC OT 6 Clicks Daily Activity     Outcome Measure   Help from another person eating meals?: A Little Help from another person taking care of personal grooming?: A Little Help from another person toileting, which includes using toliet, bedpan, or urinal?: A Lot Help from another person bathing (including washing, rinsing, drying)?: A Lot Help from another person to put on and taking  off regular upper body clothing?: A Little Help from another person to put on and taking off regular lower body clothing?: A Lot 6 Click Score: 15    End of Session Equipment Utilized During Treatment: Oxygen  OT Visit Diagnosis: Other abnormalities of gait and mobility (R26.89);Muscle weakness (generalized) (M62.81)   Activity Tolerance Patient tolerated treatment well   Patient Left with call bell/phone within reach;with family/visitor present;Other (comment) (on [facility])   Nurse Communication Mobility status;Other (comment) (on [facility])        Time: 1015-1040 OT Time Calculation (min): 25 min  Charges: OT General Charges $OT Visit: 1 Visit OT Treatments $Self Care/Home Management : 8-22 mins  Izetta Claude, MS, OTR/L , CBIS ascom [phone]  01/17/24, 11:27 AM

## 2024-01-17 NOTE — Progress Notes (Signed)
 NAME:  Sarah [name], MRN:  [id number], DOB:  1950-09-02, LOS: 5 ADMISSION DATE:  01/12/2024 History of Present Illness:  This is a 73 yo female who presented to [facility] ER on 09/19 with c/o LLQ abdominal pain with black stool onset several days prior to presentation.  Per ED notes on 09/16 pt was sent over from the cancer center to the ER with abdominal pain and dark tarry stools.  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  She received metronidazole  and ceftriaxone .  EDP recommended hospital admission, however pt refused.  She was discharged from the ER and prescribed Augmentin  and instructed to follow-up with her PCP.     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  However, treatment put on hold since 09/18 per oncologist recommendations due to colitis dx.  She has chronic dyspnea at baseline, but reports this has worsened along with worsening bilateral lower extremity edema she suspected this is due to chemotherapy treatment.    ED Course  Upon arrival to the ER during this presentation pt reported worsening abdominal pain, nausea, and poor po intake despite compliance with Augmentin .  Significant lab results: K+ 3.3/calcium  8.6/total bilirubin 1.4/hgb 9.5/platelet count 126.  CXR negative for acute cardiopulmonary disease.  Pt received ceftriaxone /metronidazole /4 mg of iv morphine /1L NS bolus.  Pt admitted to the [facility] per hospitalist team for additional workup and treatment.    Pertinent  Medical History  Arthritis  Asthma  Depression  Dyspnea GERD HLD HTN  Hypothyroidism  CML    Significant Hospital Events: Including procedures, antibiotic start and stop dates in addition to other pertinent events   09/19: Admitted to the medsurg unit with acute GI bleed and proctocolitis  09/20: GI consulted and scheduled EGD and colonoscopy with possible biopsy on 09/22 09/22: Pt ingested bowel prep  overnight and developed acute hypoxic respiratory failure secondary to aspiration following multiple episodes of emesis requiring transfer to ICU and mechanical intubation  01/16/2024: Patient extubated to HFNC and alternating with Bipap.   Interim History / Subjective:  Patient doing well this am. Oxygen need improving.   Objective    Blood pressure (!) 110/46, pulse 80, temperature (!) 97.3 F (36.3 C), temperature source Oral, resp. rate (!) 22, height 5' 3 (1.6 m), weight 81.7 kg, SpO2 97%.    FiO2 (%):  [55 %-65 %] 55 %   Intake/Output Summary (Last 24 hours) at 01/17/2024 0932 Last data filed at 01/17/2024 0900 Gross per 24 hour  Intake 177.66 ml  Output 750 ml  Net -572.34 ml   Filed Weights   01/12/24 1832 01/16/24 0500 01/17/24 0452  Weight: 84.1 kg 84.7 kg 81.7 kg    Examination: General: NAD, comfortable on HFNC.  HENT: Supple neck, reactive pupils  Lungs: Coarse breath sounds bilaterally Cardiovascular: Normal S1, Normal S2, RRR  Abdomen: Soft, non tender non disteded Extremities: Warm and well perfused no edema.   Labs and imaging were reviewed.   Assessment and Plan  #Acute hypoxic respiratory failure requring intubation and mechanical ventilation s/p extubation on 09/23 secondary to  #Aspiration pneumonia  #Severe EDAC on bronchoscopy  #Melena however H&H stable, endoscopy postpone given acute event.  #Hypothyroidism  #Asthma  #GERD #CML   N: C/w Home Celexa . Delerium precautions. C: c/w home amlodipine . L: Duonebs Q6H. Hypertonic saline Q8H. Vest PT Q8H. C/w systemic steroids for a total of 5 days. CPAP at bedtime. G: NPO for now. Speech eval.  Advance diet carefully. Lax as needed.  R: Monitor UOP and Avoid Nephrotox agents.   H: H&H  stable. Ok to restart chemoprophylaxis.  E: POC 140-180. Home levothyroxine  I: Zosyn  for a total of 5 days.   Ok to downgrade to TRH Stepdown  Critical care time: 40 minutes    Darrin Barn, MD [place]  Pulmonary Critical Care 01/17/2024 9:32 AM

## 2024-01-17 NOTE — Progress Notes (Signed)
  Inpatient Rehab Admissions Coordinator :  Per therapy recommendations, patient was screened for CIR candidacy by Ottie Glazier RN MSN.  At this time patient appears to be a potential candidate for CIR. I will place a rehab consult per protocol for full assessment. Please call me with any questions.  Ottie Glazier RN MSN Admissions Coordinator [phone]

## 2024-01-17 NOTE — Progress Notes (Signed)
 PHARMACY CONSULT NOTE - FOLLOW UP  Pharmacy Consult for Electrolyte Monitoring and Replacement   Recent Labs: Potassium (mmol/L)  Date Value  01/17/2024 3.5   Magnesium (mg/dL)  Date Value  90/75/7974 2.0   Calcium  (mg/dL)  Date Value  90/75/7974 8.6 (L)   Albumin (g/dL)  Date Value  90/75/7974 2.8 (L)   Phosphorus (mg/dL)  Date Value  90/75/7974 4.0   Sodium (mmol/L)  Date Value  01/17/2024 141     Assessment: 73 y.o. female with medical history significant of MDS, Asthma/bronchiectasis, GERD, HTN, Hypothyroidism, and HTN. Pharmacy is asked to follow and replace electrolytes while in CCU  Diuretics:   Goal of Therapy:  Electrolytes WNL  9/24: K 3.5, Mg 2, Phos 4  Plan:  --- No electrolyte replacement warranted at this time  ---recheck electrolytes  in am   Ransom Blanch PGY-1 Pharmacy Resident  [facility] [facility]  01/17/2024 7:47 AM

## 2024-01-17 NOTE — Evaluation (Signed)
 Clinical/Bedside Swallow Evaluation Patient Details  Name: Sarah [name] MRN: [id number] Date of Birth: 1950/08/27  Today's Date: 01/17/2024 Time: SLP Start Time (ACUTE ONLY): 1215 SLP Stop Time (ACUTE ONLY): 1315 SLP Time Calculation (min) (ACUTE ONLY): 60 min  Past Medical History:  Past Medical History:  Diagnosis Date   Arthritis    Asthma    CAP (community acquired pneumonia) 08/11/2022   Depression    Dyspnea    with heat   Environmental and seasonal allergies    GERD (gastroesophageal reflux disease)    Hyperlipidemia    Hypertension    Hypothyroidism    Leukemia (HCC)    Presence of dental prosthetic device    Dental implants   Wheezing    Past Surgical History:  Past Surgical History:  Procedure Laterality Date   ABDOMINAL HYSTERECTOMY  04/26/1987   ANTERIOR CERVICAL DECOMPRESSION/DISCECTOMY FUSION 4 LEVELS N/A 11/10/2021   Procedure: C3-7 ANTERIOR CERVICAL DISCECTOMY AND FUSION (GLOBUS FORGE);  Surgeon: Clois Fret, MD;  Location: [facility] OR[facility];  Service: Neurosurgery;  Laterality: N/A;   BROW LIFT Bilateral 02/07/2020   Procedure: BLEPHAROPLASTY UPPER EYELID; W/EXCESS SKIN BROW PTOSIS REPAIR BILATERAL;  Surgeon: Ashley Greig HERO, MD;  Location: [facility];  Service: Ophthalmology;  Laterality: Bilateral;   CATARACT EXTRACTION W/PHACO Right 09/13/2016   Procedure: CATARACT EXTRACTION PHACO AND INTRAOCULAR LENS PLACEMENT (IOC);  Surgeon: Jaye Fallow, MD;  Location: [facility]S;  Service: Ophthalmology;  Laterality: Right;  US  00:38 AP% 14.3 CDE 5.51 Fluid pack lot # [id number]   CATARACT EXTRACTION W/PHACO Left 10/11/2016   Procedure: CATARACT EXTRACTION PHACO AND INTRAOCULAR LENS PLACEMENT (IOC);  Surgeon: Jaye Fallow, MD;  Location: [facility];  Service: Ophthalmology;  Laterality: Left;  US  00:30 AP% 11.3 CDE 3.50 fluid pack lot # [id number]   COLONOSCOPY WITH PROPOFOL  N/A 09/30/2014   Procedure: COLONOSCOPY WITH PROPOFOL ;  Surgeon: Reyes LELON Cota, MD;  Location: [facility];  Service: Endoscopy;  Laterality: N/A;   COLONOSCOPY WITH PROPOFOL  N/A 03/24/2021   Procedure: COLONOSCOPY WITH PROPOFOL ;  Surgeon: Unk Corinn Skiff, MD;  Location: [facility] ENDOSCOPY;  Service: Gastroenterology;  Laterality: N/A;   ESOPHAGOGASTRODUODENOSCOPY N/A 09/30/2014   Procedure: ESOPHAGOGASTRODUODENOSCOPY (EGD);  Surgeon: Reyes LELON Cota, MD;  Location: [facility];  Service: Endoscopy;  Laterality: N/A;   ESOPHAGOGASTRODUODENOSCOPY N/A 03/24/2021   Procedure: ESOPHAGOGASTRODUODENOSCOPY (EGD);  Surgeon: Unk Corinn Skiff, MD;  Location: [facility];  Service: Gastroenterology;  Laterality: N/A;   FOOT SURGERY Bilateral 04/26/1983   KNEE SURGERY  04/25/2014   HPI:  Pt is a 73 y.o. female who presented to [facility] ER on 09/19 with c/o LLQ abdominal pain with black stool onset several days prior to presentation. Pt emergently intubated and transferred to ICU on 9/22 d/t large amounts of Emesis and significant respiratory distress. Admitted for management of  Acute respiratory failure with hypoxia likely secondary to aspiration pneumonia during large amount of Emesis, with mild interstitial edema suggesting possibly acute cor pulmonale. Extubated 9/23.   PMH of MDS, Asthma/bronchiectasis, GERD, HTN, Hypothyroidism, and HTN.  PMH: HTN, GERD, HTN, depression, OSA.  Pt presented to the ED 3d prior to this admit and a CT scan of the abdomen pelvis revealed proctocolitis.  She received 1 dose of IV ceftriaxone  and Flagyl  and was discharged on Augmentin  as patient did not want to be admitted to the hospital.  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   Chest Imaging on 9/22: Below the endotracheal tube tip, there is substantial narrowing  of the tracheal lumen and proximal main stem bronchial lumens  probably from a mucus plug and less likely due to   tracheobronchomalacia. Consider deep suctioning attempt.  2. Bilateral airspace opacities with air bronchograms in all lobes  of the lung but with consolidation greatest in the lung bases.  Vertical gradient suggests at least a component of acute pulmonary  edema.  Prior Chest Imaging on 9/16 and 9/19 were Negative for acute processes.    Assessment / Plan / Recommendation  Clinical Impression   Pt seen for BSE today. Pt awake, verbal and talkative. Followed all instructions and fed self. A/O x4. No significant SOB w/ exertion of talking. Pt recalled the N/V events. She denied any swallowing issues when eating/drinking.  On HHFNC O2 support; afebrile.   Pt appears to present w/ functional oropharyngeal phase swallowing w/ No oropharyngeal phase dysphagia noted, No neuromuscular deficits noted. Pt consumed po trials w/ No overt, clinical s/s of aspiration during po trials.  Pt appears at reduced risk for aspiration following general aspiration precautions. However, pt does have challenging factors that could impact oropharyngeal swallowing to include Pulmonary decline and need for increased O2 support s/p Large Emesis events and potential aspiration of REFLUX material, deconditioning and weakness, and hospitalization. These factors can increase risk for dysphagia as well as decreased oral intake overall.  OF NOTE: ANY Esophageal phase Dysmotility or Regurgitation of Reflux material can increase risk for aspiration of the Reflux material during Retrograde flow thus impact Voicing and Pulmonary status.    During po trials, pt consumed all consistencies w/ no overt coughing, decline in vocal quality, or change in respiratory presentation during/post trials. O2 sats remained 100%. Oral phase appeared [facility] w/ timely bolus management, mastication, and control of bolus propulsion for A-P transfer for swallowing. Oral clearing achieved w/ all trial consistencies -- moistened, soft foods given.  OM Exam appeared  [facility] w/ no unilateral weakness noted. Speech Clear. Pt fed self w/ min setup support.   Recommend a Regular consistency diet(as baseline for pt) w/ well-Cut meats, moistened foods for ease and conservation of energy; Thin liquids -- monitor straw use. Recommend general aspiration precautions including eating/drinking slowly, sitting upright for po intake. Reduce distractions/talking at meals. Pills 1 at a time w/ Water  vs WHOLE in Puree for easier swallowing. REFLUX precautions.  Education given on Pills in Puree; food consistencies and easy to eat options; general aspiration and REFLUX precautions to pt. NSG to reconsult if any new needs arise. NSG updated, agreed. MD updated. Recommend Dietician f/u for support. Precautions posted in chart, room.  SLP Visit Diagnosis: Dysphagia, unspecified (R13.10) (Esophageal phase Dysmotility w/ recent Regurgitation episodes(N/V events); deconditioning and pulmonary decline on HFNC support)    Aspiration Risk   (reduced from an oropharyngeal phase standpoint but increased from an Esophageal phase standpoing in setting of Retrograde activity/REFLUX)    Diet Recommendation   Thin;Age appropriate regular (cut, moistened foods w/ REFLUX precautions) = a Regular consistency diet(as baseline for pt) w/ well-Cut meats, moistened foods for ease and conservation of energy; Thin liquids -- monitor straw use. Recommend general aspiration precautions including eating/drinking slowly, sitting upright for po intake. Reduce distractions/talking at meals.   Medication Administration: Whole meds with liquid (vs Whole in Puree if easier)    Other  Recommendations Recommended Consults: Consider GI evaluation;Consider esophageal assessment (Dietician support) Oral Care Recommendations: Oral care  BID;Patient independent with oral care (setup)     Assistance Recommended at Discharge  PRN  Functional Status Assessment Patient has not had a recent decline in their functional status   Frequency and Duration  (n/a)   (n/a)       Prognosis Prognosis for improved oropharyngeal function: Good Barriers to Reach Goals:  (Deconditioning) Barriers/Prognosis Comment: Esophageal phase Dysmotility w/ recent Regurgitation episodes(N/V events); deconditioning and pulmonary decline on HFNC support      Swallow Study   General Date of Onset: 01/12/24 HPI: Pt is a 73 y.o. female who presented to [facility] ER on 09/19 with c/o LLQ abdominal pain with black stool onset several days prior to presentation. Pt emergently intubated and transferred to ICU on 9/22 d/t large amounts of Emesis and significant respiratory distress. Admitted for management of  Acute respiratory failure with hypoxia likely secondary to aspiration pneumonia during large amount of Emesis, with mild interstitial edema suggesting possibly acute cor pulmonale. Extubated 9/23.   PMH of MDS, Asthma/bronchiectasis, GERD, HTN, Hypothyroidism, and HTN.  PMH: HTN, GERD, HTN, depression, OSA.  Pt presented to the ED 3d prior to this admit and a CT scan of the abdomen pelvis revealed proctocolitis.  She received 1 dose of IV ceftriaxone  and Flagyl  and was discharged on Augmentin  as patient did not want to be admitted to the hospital.  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   Chest Imaging on 9/22: Below the endotracheal tube tip, there is substantial narrowing  of the tracheal lumen and proximal main stem bronchial lumens  probably from a mucus plug and less likely due to  tracheobronchomalacia. Consider deep suctioning attempt.  2. Bilateral airspace opacities with air bronchograms in all lobes  of the lung but with consolidation greatest in the lung bases.  Vertical gradient suggests at least a component of acute pulmonary  edema.  Prior Chest Imaging on 9/16 and 9/19 were Negative for acute processes. Type of Study: Bedside  Swallow Evaluation Previous Swallow Assessment: none Diet Prior to this Study: NPO (regular diet at home) Temperature Spikes Noted: No (wbc elevated) Respiratory Status: Nasal cannula (HHFNC 40L; 60%) History of Recent Intubation: Yes Total duration of intubation (days): 1 days Date extubated: 01/16/24 Behavior/Cognition: Alert;Cooperative;Pleasant mood (x4) Oral Cavity Assessment: Within Functional Limits Oral Care Completed by SLP: Yes Oral Cavity - Dentition: Adequate natural dentition Vision: Functional for self-feeding Self-Feeding Abilities: Able to feed self (setup) Patient Positioning: Upright in chair Baseline Vocal Quality: Normal Volitional Cough: Strong Volitional Swallow: Able to elicit    Oral/Motor/Sensory Function Overall Oral Motor/Sensory Function: Within functional limits   Ice Chips Ice chips: Within functional limits Presentation: Spoon (fed; 3 trials)   Thin Liquid Thin Liquid: Within functional limits Presentation: Cup;Self Fed;Straw (~6 -7 ozs total) Other Comments: water , soda    Nectar Thick Nectar Thick Liquid: Not tested   Honey Thick Honey Thick Liquid: Not tested   Puree Puree: Within functional limits Presentation: Self Fed;Spoon (10+ trials)   Solid     Solid: Within functional limits Presentation: Self Fed (8+ trials)        Comer Portugal, MS, CCC-SLP Speech Language Pathologist Rehab Services; [facility] [place] [phone] (ascom) Patsey Pitstick 01/17/2024,4:33 PM

## 2024-01-18 ENCOUNTER — Other Ambulatory Visit: Payer: Self-pay

## 2024-01-18 DIAGNOSIS — J441 Chronic obstructive pulmonary disease with (acute) exacerbation: Secondary | ICD-10-CM

## 2024-01-18 DIAGNOSIS — R195 Other fecal abnormalities: Secondary | ICD-10-CM | POA: Diagnosis not present

## 2024-01-18 DIAGNOSIS — J9601 Acute respiratory failure with hypoxia: Secondary | ICD-10-CM

## 2024-01-18 DIAGNOSIS — K921 Melena: Secondary | ICD-10-CM

## 2024-01-18 DIAGNOSIS — E039 Hypothyroidism, unspecified: Secondary | ICD-10-CM

## 2024-01-18 DIAGNOSIS — J690 Pneumonitis due to inhalation of food and vomit: Secondary | ICD-10-CM

## 2024-01-18 DIAGNOSIS — N179 Acute kidney failure, unspecified: Secondary | ICD-10-CM

## 2024-01-18 DIAGNOSIS — D62 Acute posthemorrhagic anemia: Secondary | ICD-10-CM | POA: Diagnosis not present

## 2024-01-18 DIAGNOSIS — K529 Noninfective gastroenteritis and colitis, unspecified: Secondary | ICD-10-CM | POA: Diagnosis not present

## 2024-01-18 DIAGNOSIS — D469 Myelodysplastic syndrome, unspecified: Secondary | ICD-10-CM

## 2024-01-18 DIAGNOSIS — F419 Anxiety disorder, unspecified: Secondary | ICD-10-CM

## 2024-01-18 DIAGNOSIS — I10 Essential (primary) hypertension: Secondary | ICD-10-CM

## 2024-01-18 DIAGNOSIS — E876 Hypokalemia: Secondary | ICD-10-CM

## 2024-01-18 DIAGNOSIS — F32A Depression, unspecified: Secondary | ICD-10-CM

## 2024-01-18 LAB — CULTURE, RESPIRATORY W GRAM STAIN: Culture: NO GROWTH

## 2024-01-18 LAB — CBC
HCT: 23 % — ABNORMAL LOW (ref 36.0–46.0)
Hemoglobin: 7.4 g/dL — ABNORMAL LOW (ref 12.0–15.0)
MCH: 35.6 pg — ABNORMAL HIGH (ref 26.0–34.0)
MCHC: 32.2 g/dL (ref 30.0–36.0)
MCV: 110.6 fL — ABNORMAL HIGH (ref 80.0–100.0)
Platelets: 167 K/uL (ref 150–400)
RBC: 2.08 MIL/uL — ABNORMAL LOW (ref 3.87–5.11)
RDW: 15.2 % (ref 11.5–15.5)
WBC: 11.5 K/uL — ABNORMAL HIGH (ref 4.0–10.5)
nRBC: 0 % (ref 0.0–0.2)

## 2024-01-18 LAB — RENAL FUNCTION PANEL
Albumin: 2.9 g/dL — ABNORMAL LOW (ref 3.5–5.0)
Anion gap: 13 (ref 5–15)
BUN: 24 mg/dL — ABNORMAL HIGH (ref 8–23)
CO2: 32 mmol/L (ref 22–32)
Calcium: 8.5 mg/dL — ABNORMAL LOW (ref 8.9–10.3)
Chloride: 96 mmol/L — ABNORMAL LOW (ref 98–111)
Creatinine, Ser: 1.16 mg/dL — ABNORMAL HIGH (ref 0.44–1.00)
GFR, Estimated: 49 mL/min — ABNORMAL LOW (ref >60)
Glucose, Bld: 100 mg/dL — ABNORMAL HIGH (ref 70–99)
Phosphorus: 4.1 mg/dL (ref 2.5–4.6)
Potassium: 3.3 mmol/L — ABNORMAL LOW (ref 3.5–5.1)
Sodium: 141 mmol/L (ref 135–145)

## 2024-01-18 LAB — MAGNESIUM: Magnesium: 2.5 mg/dL — ABNORMAL HIGH (ref 1.7–2.4)

## 2024-01-18 MED ORDER — CITALOPRAM HYDROBROMIDE 10 MG PO TABS
20.0000 mg | ORAL_TABLET | Freq: Every day | ORAL | Status: DC
  Administered 2024-01-19 – 2024-01-22 (×4): 20 mg via ORAL
  Filled 2024-01-18: qty 2
  Filled 2024-01-18: qty 1
  Filled 2024-01-18 (×2): qty 2

## 2024-01-18 MED ORDER — LORATADINE 10 MG PO TABS
10.0000 mg | ORAL_TABLET | Freq: Every day | ORAL | Status: DC
  Administered 2024-01-19 – 2024-01-22 (×4): 10 mg via ORAL
  Filled 2024-01-18 (×4): qty 1

## 2024-01-18 MED ORDER — ADULT MULTIVITAMIN W/MINERALS CH
1.0000 | ORAL_TABLET | Freq: Every day | ORAL | Status: DC
  Administered 2024-01-19 – 2024-01-21 (×3): 1 via ORAL
  Filled 2024-01-18 (×3): qty 1

## 2024-01-18 MED ORDER — TRAZODONE HCL 50 MG PO TABS
50.0000 mg | ORAL_TABLET | Freq: Every evening | ORAL | Status: DC | PRN
  Administered 2024-01-18 – 2024-01-21 (×4): 50 mg via ORAL
  Filled 2024-01-18 (×4): qty 1

## 2024-01-18 MED ORDER — POTASSIUM CHLORIDE CRYS ER 20 MEQ PO TBCR
40.0000 meq | EXTENDED_RELEASE_TABLET | Freq: Once | ORAL | Status: AC
Start: 2024-01-18 — End: 2024-01-18
  Administered 2024-01-18: 40 meq via ORAL
  Filled 2024-01-18: qty 2

## 2024-01-18 MED ORDER — LEVOTHYROXINE SODIUM 50 MCG PO TABS
50.0000 ug | ORAL_TABLET | Freq: Every day | ORAL | Status: DC
  Administered 2024-01-19 – 2024-01-22 (×4): 50 ug via ORAL
  Filled 2024-01-18 (×4): qty 1

## 2024-01-18 MED ORDER — AMLODIPINE BESYLATE 5 MG PO TABS
5.0000 mg | ORAL_TABLET | Freq: Every day | ORAL | Status: DC
  Administered 2024-01-19 – 2024-01-21 (×3): 5 mg via ORAL
  Filled 2024-01-18 (×4): qty 1

## 2024-01-18 MED ORDER — BUDESON-GLYCOPYRROL-FORMOTEROL 160-9-4.8 MCG/ACT IN AERO
2.0000 | INHALATION_SPRAY | Freq: Two times a day (BID) | RESPIRATORY_TRACT | Status: DC
  Administered 2024-01-18 – 2024-01-22 (×9): 2 via RESPIRATORY_TRACT
  Filled 2024-01-18: qty 5.9

## 2024-01-18 NOTE — Assessment & Plan Note (Signed)
 On Solu-Medrol  and Breztri  inhaler

## 2024-01-18 NOTE — Progress Notes (Signed)
 PHARMACY CONSULT NOTE - FOLLOW UP  Pharmacy Consult for Electrolyte Monitoring and Replacement   Recent Labs: Potassium (mmol/L)  Date Value  01/18/2024 3.3 (L)   Magnesium (mg/dL)  Date Value  90/74/7974 2.5 (H)   Calcium  (mg/dL)  Date Value  90/74/7974 8.5 (L)   Albumin (g/dL)  Date Value  90/74/7974 2.9 (L)   Phosphorus (mg/dL)  Date Value  90/74/7974 4.1   Sodium (mmol/L)  Date Value  01/18/2024 141     Assessment: 73 y.o. female with medical history significant of MDS, Asthma/bronchiectasis, GERD, HTN, Hypothyroidism, and HTN. Pharmacy is asked to follow and replace electrolytes while in CCU  Diuretics:   Goal of Therapy:  Electrolytes WNL  9/2: K 3.3, Mg 2.5, Phos 4.1  Plan:  --- KCl 40 mEq PO x 1  ---recheck electrolytes in am   Because this consult was generated as part of an ICU order set and patient is transferring pharmacy will sign off for now.   Please feel free to reach out if any further assistance is needed  Ransom Blanch PGY-1 Pharmacy Resident  [facility]  01/18/2024 6:38 AM

## 2024-01-18 NOTE — Assessment & Plan Note (Addendum)
 Replaced

## 2024-01-18 NOTE — Assessment & Plan Note (Addendum)
 Patient required intubation and mechanical ventilation.  Patient was extubated on 9/23.  Patient off oxygen.

## 2024-01-18 NOTE — Assessment & Plan Note (Signed)
On Celexa 

## 2024-01-18 NOTE — Progress Notes (Addendum)
 Good day. Weaned from HFNC to 2 L/[place]. Output low. Patient very interactive. Lots of visitors today. Up in chair this afternoon. Both IVs pulled due to leaking.

## 2024-01-18 NOTE — Progress Notes (Signed)
 Progress Note   Patient: Sarah [name] FMW:[id number] DOB: January 18, 1951 DOA: 01/12/2024     6 DOS: the patient was seen and examined on 01/18/2024   Brief hospital course: 73 yo female who presented to [facility] ER on 09/19 with c/o LLQ abdominal pain with black stool onset several days prior to presentation.  Per ED notes on 09/16 pt was sent over from the cancer center to the ER with abdominal pain and dark tarry stools.  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  She received metronidazole  and ceftriaxone .  EDP recommended hospital admission, however pt refused.  She was discharged from the ER and prescribed Augmentin  and instructed to follow-up with her PCP.     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  However, treatment put on hold since 09/18 per oncologist recommendations due to colitis dx.  She has chronic dyspnea at baseline, but reports this has worsened along with worsening bilateral lower extremity edema she suspected this is due to chemotherapy treatment.    ED Course  Upon arrival to the ER during this presentation pt reported worsening abdominal pain, nausea, and poor po intake despite compliance with Augmentin .  Significant lab results: K+ 3.3/calcium  8.6/total bilirubin 1.4/hgb 9.5/platelet count 126.  CXR negative for acute cardiopulmonary disease.  Pt received ceftriaxone /metronidazole /4 mg of iv morphine /1L NS bolus.  Pt admitted to the [facility] per hospitalist team for additional workup and treatment.   09/19: Admitted to the medsurg unit with acute GI bleed and proctocolitis  09/20: GI consulted and scheduled EGD and colonoscopy with possible biopsy on 09/22 09/22: Pt ingested bowel prep overnight and developed acute hypoxic respiratory failure secondary to aspiration following multiple episodes of emesis requiring transfer to ICU and mechanical intubation  09/23: Patient extubated to HFNC  and alternating with Bipap.   9/25.  Medical team took over case.  Patient on 100% heated high flow nasal cannula, 30 L flow.  Assessment and Plan: * Acute respiratory failure with hypoxia (HCC) Patient required intubation and mechanical ventilation.  Patient was extubated on 9/23.  Today patient on 100% heated high flow nasal cannula at 30 L flow.  On Solu-Medrol   Aspiration pneumonia (HCC) On Zosyn   COPD with acute exacerbation (HCC) On Solu-Medrol  and Breztri  inhaler  Acute on chronic blood loss anemia Current hemoglobin 7.4.  May end up needing a blood transfusion.  She has history of myelodysplastic syndrome.  Proctocolitis Stool studies are negative  Essential hypertension On Norvasc   Hypothyroidism, unspecified On levothyroxine   Anxiety and depression On Celexa   Hypokalemia Replace potassium  AKI (acute kidney injury) Creatinine 0.65 at its lowest point.  Creatinine peaked at 1.6.  Last creatinine 1.16.        Subjective: Patient feels okay.  Initially admitted with proctocolitis and GI bleed.  Patient does not complain of shortness of breath despite being on 100% heated high flow nasal cannula.  Physical Exam: Vitals:   01/18/24 0700 01/18/24 0800 01/18/24 0900 01/18/24 1000  BP:  137/67 119/67   Pulse: 60 72 79 76  Resp: 17 (!) 21 (!) 22 18  Temp:  98.2 F (36.8 C)    TempSrc:      SpO2: 100% 100% 96% 96%  Weight:      Height:       Physical Exam HENT:     Head: Normocephalic.  Eyes:     General: Lids are normal.     Conjunctiva/sclera: Conjunctivae normal.  Cardiovascular:     Rate and Rhythm: Normal rate and regular rhythm.     Heart sounds: Normal heart sounds, S1 normal and S2 normal.  Pulmonary:     Breath sounds: Examination of the right-lower field reveals decreased breath sounds and rhonchi. Examination of the left-lower field reveals decreased breath sounds and rhonchi. Decreased breath sounds and rhonchi present. No wheezing or  rales.  Abdominal:     Palpations: Abdomen is soft.     Tenderness: There is no abdominal tenderness.  Musculoskeletal:     Right lower leg: No swelling.     Left lower leg: No swelling.  Skin:    General: Skin is warm.     Findings: No rash.  Neurological:     Mental Status: She is alert and oriented to person, place, and time.     Data Reviewed: Potassium 3.3, creatinine 1.16, hemoglobin 7.4  Family Communication: Spoke with friend on the phone  Disposition: Status is: Inpatient Remains inpatient appropriate because: Patient on 100% heated high flow nasal cannula this morning when I saw her  Planned Discharge Destination: Possibly acute inpatient rehab    Time spent: 28 minutes Case discussed with critical care team  Author: Charlie Patterson, MD 01/18/2024 11:35 AM  For on call review [email or web address].

## 2024-01-18 NOTE — Assessment & Plan Note (Addendum)
 On Zosyn .  Switched over to Augmentin  last night to complete course.

## 2024-01-18 NOTE — Assessment & Plan Note (Signed)
 Stool studies are negative.

## 2024-01-18 NOTE — Assessment & Plan Note (Addendum)
 On Norvasc .  Will decrease dose to half dose 2.5 mg daily

## 2024-01-18 NOTE — Assessment & Plan Note (Signed)
-  On levothyroxine

## 2024-01-18 NOTE — TOC Initial Note (Signed)
 Transition of Care [facility]) - Initial/Assessment Note    Patient Details  Name: Sarah [name] MRN: [id number] Date of Birth: 08-18-1950  Transition of [facility] CM/SW Contact:    Corrie JINNY Ruts, LCSW Phone Number: 01/18/2024, 1:28 PM  Clinical Narrative:                 Chart reviewed. The patient was admitted for proctocolitis. I spoke with the patient and her two friends [place] and [place]. I introduced myself, my role, reason for consult, and HIPAA. The patient consented to friends staying during the consult. The patient reports that she has a PCP. The patient report that she lives with her friend Heron. The patient reports that she did everything independently. The patient reports that she drove herself to medical appointments or her friend Gerhardt would assist. The patient reports that Bari or Gerhardt will help transport her during discharge. The patient reports that [facility] use [facility] as a pharmacy. The patient reports that her inhaler medication is somewhat expensive.   The patient reports that she has resolved her chemo pill copay. The patient reports that she has never has HH or been admitted into a SNF. The patient reports that she has a cane, walker, and toilet riser.   The patient reports that she has no other concerns at the time of the consult.     Barriers to Discharge: Continued Medical Work up   Patient Goals and CMS Choice            Expected Discharge Plan and Services                                              Prior Living Arrangements/Services     Patient language and need for interpreter reviewed:: Yes        Need for Family Participation in Patient Care: Yes (Comment)     Criminal Activity/Legal Involvement Pertinent to Current Situation/Hospitalization: No - Comment as needed  Activities of Daily Living   ADL Screening (condition at time of admission) Independently performs ADLs?: Yes (appropriate for developmental age) Is the  patient deaf or have difficulty hearing?: No Does the patient have difficulty seeing, even when wearing glasses/contacts?: No Does the patient have difficulty concentrating, remembering, or making decisions?: No  Permission Sought/Granted                  Emotional Assessment Appearance:: Appears stated age Attitude/Demeanor/Rapport: Gracious Affect (typically observed): Calm Orientation: : Oriented to Self, Oriented to Place, Oriented to  Time, Oriented to Situation Alcohol / Substance Use: Not Applicable Psych Involvement: No (comment)  Admission diagnosis:  Proctocolitis [K52.9] Colitis [K52.9] 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] Patient Active Problem List   Diagnosis Date Noted   Acute respiratory failure with hypoxia (HCC) 01/18/2024   Aspiration pneumonia (HCC) 01/18/2024   COPD with acute exacerbation (HCC) 01/18/2024   Acute on chronic blood loss anemia 01/18/2024   AKI (acute kidney injury) 01/18/2024   Hypokalemia 01/18/2024   Hematochezia 01/18/2024   Proctocolitis 01/12/2024   Bronchiectasis (HCC) 11/07/2023   History of acute respiratory failure 09/04/2023   Right middle lobe pneumonia 09/04/2023   History of cystitis 05/07/2023   Coronary artery disease due to calcified coronary lesion 05/05/2023   Persistent asthma with acute exacerbation 05/05/2023   Bilateral lower extremity edema 12/02/2022   OSA (obstructive  sleep apnea) 09/06/2022   Hyperglycemia 08/15/2022   Snoring 06/03/2022   Hypersomnia 05/18/2022   Suprapubic cramping 05/18/2022   Overactive bladder 05/18/2022   MDS (myelodysplastic syndrome) (HCC) 04/20/2022   Tubular adenoma of colon 02/07/2022   Cervical myelopathy (HCC) 11/10/2021   Spinal stenosis, cervical region 09/15/2021   Adenomatous polyp of transverse colon    Polyp of colon    Thrombocytopenia 01/13/2021   Essential hypertension 12/20/2020   Macrocytic anemia 12/18/2020   Allergic rhinitis 12/13/2020    Fatigue 12/13/2020   Insomnia 12/13/2020   Grief reaction 08/09/2020   Cognitive complaints 08/09/2020   Neuropathy 06/14/2020   Educated about COVID-19 virus infection 04/10/2019   Prediabetes 10/13/2018   LVH (left ventricular hypertrophy) due to hypertensive disease, without heart failure 10/13/2018   Hospital discharge follow-up 07/08/2018   Hypothyroidism, unspecified 06/15/2017   Post herpetic neuralgia 06/06/2017   Encounter for preventive health examination 09/09/2014   Chronic GERD 09/09/2014   Hyperlipidemia 08/17/2014   S/P hysterectomy 04/28/2013   Initial Medicare annual wellness visit 04/28/2013   Anxiety and depression    PCP:  [name], MD Pharmacy:   [facility] - [place], [place] - [address] [place] [address] Phone: [phone] Fax: [phone]  [facility] - [place], [place] - [address] [place] [address] Phone: [phone] Fax: [phone]  [facility] [place], [place] - [address] [place] [address] Phone: [phone] Fax: [phone]     Social Drivers of Health (SDOH) Social History: SDOH Screenings   Food Insecurity: No Food Insecurity (01/12/2024)  Housing: Low Risk  (01/12/2024)  Transportation Needs: No Transportation Needs (01/12/2024)  Utilities: Not At Risk (01/12/2024)  Alcohol Screen: Low Risk  (05/15/2023)  Depression (PHQ2-9): Low Risk  (01/12/2024)  Recent Concern: Depression (PHQ2-9) - Medium Risk (11/06/2023)  Financial Resource Strain: Low Risk  (05/15/2023)  Physical Activity: Inactive (05/15/2023)  Social Connections: Moderately Isolated (01/12/2024)  Stress: No Stress Concern Present (05/15/2023)  Tobacco Use: Low Risk  (01/12/2024)  Health Literacy: Adequate Health Literacy (09/12/2023)   SDOH Interventions:     Readmission Risk Interventions    01/18/2024    1:19 PM  Readmission Risk Prevention  Plan  Transportation Screening Complete  Medication Review (RN Care Manager) Complete  PCP or Specialist appointment within 3-5 days of discharge Complete  SW Recovery Care/Counseling Consult Complete  Palliative Care Screening Not Applicable  Skilled Nursing Facility Not Applicable

## 2024-01-18 NOTE — Plan of Care (Signed)

## 2024-01-18 NOTE — Progress Notes (Signed)
 Inpatient Rehab Coordinator Note:  I spoke with patient over the phone to discuss CIR recommendations and goals/expectations of CIR stay.  We reviewed 3 hrs/day of therapy, physician follow up, and average length of stay 2 weeks (dependent upon progress) with goals of supervision to mod I.  Her friend, Gerhardt, is at the bedside and pt updated her throughout our conversation.  Pt lives with a roommate, Heron, who is home during the day and could not provide physical assist but is available for supervision.  Lenore is supportive as well, and pt has a neighbor Engineer, maintenance (IT)) who is a Lawyer and can provide some PRN support as well.  She is agreeable to pursue CIR so I will start auth request today.   Reche Lowers, PT, DPT Admissions Coordinator [phone] 01/18/24  12:52 PM

## 2024-01-18 NOTE — Assessment & Plan Note (Addendum)
 Last hemoglobin 9.0. She has history of myelodysplastic syndrome.  Patient on Protonix .  Gastroenterology plans on doing an EGD tomorrow.  Patient declined colonoscopy.

## 2024-01-18 NOTE — Progress Notes (Signed)
 Sarah Copping, MD [facility]   [address], [address] [place], [place] [address] Phone: [phone] Fax : [phone]   Subjective: This patient was admitted with dark stools and a drop in hemoglobin.  The patient was set up for an EGD and colonoscopy and during the prep had aspiration and was admitted to the ICU and intubated.  The patient is no longer intubated but still on oxygen with some shortness of breath.  The patient has a history of low-grade myelodysplastic syndrome.   Objective: Vital signs in last 24 hours: Vitals:   01/18/24 0700 01/18/24 0800 01/18/24 0900 01/18/24 1000  BP:  137/67 119/67   Pulse: 60 72 79 76  Resp: 17 (!) 21 (!) 22 18  Temp:  98.2 F (36.8 C)    TempSrc:      SpO2: 100% 100% 96% 96%  Weight:      Height:       Weight change:   Intake/Output Summary (Last 24 hours) at 01/18/2024 1159 Last data filed at 01/17/2024 1907 Gross per 24 hour  Intake 626.93 ml  Output --  Net 626.93 ml     Exam: General: The patient is laying in bed in no apparent distress.  Alert and oriented x 3   Lab Results: @LABTEST2 @ Micro Results: Recent Results (from the past 240 hours)  Gastrointestinal Panel by PCR , Stool     Status: None   Collection Time: 01/09/24  5:50 PM   Specimen: Stool  Result Value Ref Range Status   Campylobacter species NOT DETECTED NOT DETECTED Final   Plesimonas shigelloides NOT DETECTED NOT DETECTED Final   Salmonella species NOT DETECTED NOT DETECTED Final   Yersinia enterocolitica NOT DETECTED NOT DETECTED Final   Vibrio species NOT DETECTED NOT DETECTED Final   Vibrio cholerae NOT DETECTED NOT DETECTED Final   Enteroaggregative E coli (EAEC) NOT DETECTED NOT DETECTED Final   Enteropathogenic E coli (EPEC) NOT DETECTED NOT DETECTED Final   Enterotoxigenic E coli (ETEC) NOT DETECTED NOT DETECTED Final   Shiga like toxin producing E coli (STEC) NOT DETECTED NOT DETECTED Final   Shigella/Enteroinvasive E coli (EIEC) NOT DETECTED  NOT DETECTED Final   Cryptosporidium NOT DETECTED NOT DETECTED Final   Cyclospora cayetanensis NOT DETECTED NOT DETECTED Final   Entamoeba histolytica NOT DETECTED NOT DETECTED Final   Giardia lamblia NOT DETECTED NOT DETECTED Final   Adenovirus F40/41 NOT DETECTED NOT DETECTED Final   Astrovirus NOT DETECTED NOT DETECTED Final   Norovirus GI/GII NOT DETECTED NOT DETECTED Final   Rotavirus A NOT DETECTED NOT DETECTED Final   Sapovirus (I, II, IV, and V) NOT DETECTED NOT DETECTED Final    Comment: Performed at [facility], [address] [place], [place] [address]  C Difficile Quick Screen w PCR reflex     Status: None   Collection Time: 01/09/24  5:50 PM   Specimen: Stool  Result Value Ref Range Status   C Diff antigen NEGATIVE NEGATIVE Final   C Diff toxin NEGATIVE NEGATIVE Final   C Diff interpretation No C. difficile detected.  Final    Comment: Performed at [facility], [address] [place], [place] [address]  MRSA Next Gen by PCR, Nasal     Status: None   Collection Time: 01/15/24  6:01 AM   Specimen: Nasal Mucosa; Nasal Swab  Result Value Ref Range Status   MRSA by PCR Next Gen NOT DETECTED NOT DETECTED Final    Comment: (NOTE)  The GeneXpert MRSA Assay (FDA approved for NASAL specimens only), is one component of a comprehensive MRSA colonization surveillance program. It is not intended to diagnose MRSA infection nor to guide or monitor treatment for MRSA infections. Test performance is not FDA approved in patients less than 59 years old. Performed at [facility], [address] [place], [place] [address]   Culture, Respiratory w Gram Stain     Status: None   Collection Time: 01/15/24  2:51 PM   Specimen: Tracheal Aspirate; Respiratory  Result Value Ref Range Status   Specimen Description   Final    TRACHEAL ASPIRATE Performed at [facility], [address] [place], [place] [address]    Special Requests   Final     NONE Performed at [facility], [address] [place], [place] [address]    Gram Stain   Final    RARE WBC PRESENT, PREDOMINANTLY PMN NO ORGANISMS SEEN    Culture   Final    NO GROWTH 2 DAYS Performed at [facility], [address], [place], [place] [address]    Report Status 01/18/2024 FINAL  Final   Studies/Results: No results found. Medications: I have reviewed the patient's current medications. Scheduled Meds:  [START ON 01/19/2024] amLODipine   5 mg Oral Daily   budesonide -glycopyrrolate -formoterol   2 puff Inhalation BID   Chlorhexidine  Gluconate Cloth  6 each Topical Daily   [START ON 01/19/2024] citalopram   20 mg Oral Daily   feeding supplement  237 mL Oral BID BM   heparin  injection (subcutaneous)  5,000 Units Subcutaneous Q8H   ipratropium-albuterol   3 mL Nebulization Q6H   [START ON 01/19/2024] levothyroxine   50 mcg Oral Q0600   lidocaine   5 mL Nebulization Once   [START ON 01/19/2024] loratadine   10 mg Oral Daily   methylPREDNISolone  (SOLU-MEDROL ) injection  40 mg Intravenous Daily   [START ON 01/19/2024] multivitamin with minerals  1 tablet Oral Daily   pantoprazole  (PROTONIX ) IV  40 mg Intravenous Q12H   sodium chloride  HYPERTONIC  4 mL Nebulization Q6H   Continuous Infusions:  piperacillin -tazobactam (ZOSYN )  IV 3.375 g (01/18/24 0625)   PRN Meds:.acetaminophen  **OR** acetaminophen , guaiFENesin -dextromethorphan , metoCLOPramide  (REGLAN ) injection, ondansetron  (ZOFRAN ) IV, mouth rinse, polyethylene glycol powder, traZODone    Assessment: Principal Problem:   Acute respiratory failure with hypoxia (HCC) Active Problems:   Anxiety and depression   Hypothyroidism, unspecified   Essential hypertension   MDS (myelodysplastic syndrome) (HCC)   Proctocolitis   Aspiration pneumonia (HCC)   COPD with acute exacerbation (HCC)   Acute on chronic blood loss anemia   AKI (acute kidney injury)   Hypokalemia    Plan: This patient is in need of an EGD  and colonoscopy but her hemoglobin has been relatively stable without any sign of continued bleeding.  The patient and I have discussed the need to proceed with an EGD and colonoscopy but there is not an urgency with her being stable from a hemoglobin point of view.  The patient will be given a few more days to improve her respiratory status and then we will be reconsidered for an EGD and colonoscopy.  The patient has been explained the plan and agrees with it.   LOS: 6 days   Sarah Copping, MD.FACG 01/18/2024, 11:59 AM Pager [phone] 7am-5pm  Check AMION for 5pm -7am coverage and on weekends

## 2024-01-18 NOTE — Assessment & Plan Note (Addendum)
 Creatinine 0.65 at its lowest point.  Creatinine peaked at 1.6.  Last creatinine 0.98.

## 2024-01-18 NOTE — Progress Notes (Signed)
 Physical Medicine & Rehabilitation Consult Service  Pt discussed with rehab admissions coordinator. Chart has been reviewed. This is a with a past history of asthma and COPD, CML, as well as depression who presented to the hospital 919 with lower quadrant pain and black stools/melena.  Course complicated by an aspiration event on 922 requiring intubation and mechanical ventilation.  CT of the chest revealed possible pulmonary edema as well as bilateral consolidative opacities at the bases consistent with aspiration pneumonia.  Patient's hemoglobin has been stable.  No imminent plan for EGD or colonoscopy.  Patient was started on Solu-Medrol  and Zosyn  for treatment of aspiration pneumonia.  Patient was extubated on 01/16/2024 to high flow nasal cannula alternating with BiPAP.  She was seen by speech-language pathology yesterday for a bedside swallow and was cleared for a regular diet with thin liquids.  Patient was also up with physical therapy yesterday and was min assist for transfers but did not test gait.  With Occupational Therapy 2 days prior she required max assist for basic clothing manipulation.  Patient lives in a mobile home with 4 steps to enter.  She was independent and driving prior to this.  She may have a roommate.   Home: Home Living Family/patient expects to be discharged to:: Private residence Living Arrangements:  (roommate?) Available Help at Discharge: Family, Friend(s), Neighbor Type of Home: Mobile home Home Access: Stairs to enter Secretary/administrator of Steps: 4 Entrance Stairs-Rails: Can reach both, Left, Right Home Layout: One level Bathroom Shower/Tub: Tub/shower unit, Health visitor: Handicapped height Bathroom Accessibility: Yes Home Equipment: Agricultural consultant (2 wheels), Toilet riser, Medical laboratory scientific officer - single point, [employer] - quad, Information systems manager, Grab bars - tub/shower  Functional History: Prior Function Prior Level of Function : Independent/Modified Independent,  Driving Mobility Comments: ambulatory without AD, denies falls, helps to take care of her roommate/friend Functional Status:  Mobility: Bed Mobility Overal bed mobility: Needs Assistance Bed Mobility: Sidelying to Sit Rolling: Supervision, Contact guard assist Sidelying to sit: Min assist, HOB elevated, Used rails Supine to sit: Mod assist, HOB elevated, Used rails Sit to supine: Mod assist General bed mobility comments: cuing re: sequencing to come to sitting EOB Transfers Overall transfer level: Needs assistance Equipment used: Rolling walker (2 wheels) Transfers: Sit to/from Stand, Bed to chair/wheelchair/BSC Sit to Stand: Min assist, +2 physical assistance, +2 safety/equipment Bed to/from chair/wheelchair/BSC transfer type:: Step pivot Step pivot transfers: Contact guard assist, Min assist, +2 safety/equipment, +2 physical assistance General transfer comment: min assist +2 fade to min assist +1 for sit<>stand      ADL: ADL Overall ADL's : Needs assistance/impaired Toilet Transfer: Minimal assistance, Moderate assistance, Rolling walker (2 wheels), BSC/3in1 Toileting- Clothing Manipulation and Hygiene: Maximal assistance, Sit to/from stand Toileting - Clothing Manipulation Details (indicate cue type and reason): for hygiene and clothing management  Cognition: Cognition Orientation Level: Oriented X4 Cognition Arousal: Alert Behavior During Therapy: WFL for tasks assessed/performed   Assessment: Significant debility after acute respiratory failure/complicated medical course Melena of unclear source CML   Plan:  This patient would benefit from acute inpatient rehab to address functional mobility and self-care. Additionally, the patient requires daily MD oversight of the active medical issues noted above. Projected goals would be mod I to supervision with an ELOS of 8-11 days.  Will need to clarify social supports as she will not simply be able to discharge  home.  Rehab Admissions Coordinator to follow up.    Sarah IVAR Gunther, MD, [facility] Physical  Medicine & Rehabilitation Medical Director Rehabilitation Services 01/18/2024

## 2024-01-19 DIAGNOSIS — D62 Acute posthemorrhagic anemia: Secondary | ICD-10-CM | POA: Diagnosis not present

## 2024-01-19 DIAGNOSIS — J441 Chronic obstructive pulmonary disease with (acute) exacerbation: Secondary | ICD-10-CM | POA: Diagnosis not present

## 2024-01-19 DIAGNOSIS — K529 Noninfective gastroenteritis and colitis, unspecified: Secondary | ICD-10-CM | POA: Diagnosis not present

## 2024-01-19 DIAGNOSIS — R042 Hemoptysis: Secondary | ICD-10-CM

## 2024-01-19 DIAGNOSIS — J9601 Acute respiratory failure with hypoxia: Secondary | ICD-10-CM | POA: Diagnosis not present

## 2024-01-19 LAB — RENAL FUNCTION PANEL
Albumin: 2.6 g/dL — ABNORMAL LOW (ref 3.5–5.0)
Anion gap: 10 (ref 5–15)
BUN: 23 mg/dL (ref 8–23)
CO2: 32 mmol/L (ref 22–32)
Calcium: 8.2 mg/dL — ABNORMAL LOW (ref 8.9–10.3)
Chloride: 100 mmol/L (ref 98–111)
Creatinine, Ser: 1.1 mg/dL — ABNORMAL HIGH (ref 0.44–1.00)
GFR, Estimated: 53 mL/min — ABNORMAL LOW (ref >60)
Glucose, Bld: 155 mg/dL — ABNORMAL HIGH (ref 70–99)
Phosphorus: 2.8 mg/dL (ref 2.5–4.6)
Potassium: 3.5 mmol/L (ref 3.5–5.1)
Sodium: 142 mmol/L (ref 135–145)

## 2024-01-19 LAB — CBC
HCT: 24.6 % — ABNORMAL LOW (ref 36.0–46.0)
Hemoglobin: 7.9 g/dL — ABNORMAL LOW (ref 12.0–15.0)
MCH: 35.6 pg — ABNORMAL HIGH (ref 26.0–34.0)
MCHC: 32.1 g/dL (ref 30.0–36.0)
MCV: 110.8 fL — ABNORMAL HIGH (ref 80.0–100.0)
Platelets: 167 K/uL (ref 150–400)
RBC: 2.22 MIL/uL — ABNORMAL LOW (ref 3.87–5.11)
RDW: 15 % (ref 11.5–15.5)
WBC: 5.7 K/uL (ref 4.0–10.5)
nRBC: 0 % (ref 0.0–0.2)

## 2024-01-19 LAB — MAGNESIUM: Magnesium: 2.2 mg/dL (ref 1.7–2.4)

## 2024-01-19 LAB — TYPE AND SCREEN
ABO/RH(D): A POS
Antibody Screen: NEGATIVE

## 2024-01-19 MED ORDER — IPRATROPIUM-ALBUTEROL 0.5-2.5 (3) MG/3ML IN SOLN
3.0000 mL | Freq: Two times a day (BID) | RESPIRATORY_TRACT | Status: DC
  Administered 2024-01-19 – 2024-01-22 (×6): 3 mL via RESPIRATORY_TRACT
  Filled 2024-01-19 (×6): qty 3

## 2024-01-19 MED ORDER — IPRATROPIUM-ALBUTEROL 0.5-2.5 (3) MG/3ML IN SOLN
3.0000 mL | Freq: Three times a day (TID) | RESPIRATORY_TRACT | Status: DC
  Administered 2024-01-19: 3 mL via RESPIRATORY_TRACT
  Filled 2024-01-19: qty 3

## 2024-01-19 MED ORDER — AMOXICILLIN-POT CLAVULANATE 875-125 MG PO TABS
1.0000 | ORAL_TABLET | Freq: Two times a day (BID) | ORAL | Status: AC
Start: 2024-01-19 — End: 2024-01-21
  Administered 2024-01-19 – 2024-01-21 (×5): 1 via ORAL
  Filled 2024-01-19 (×5): qty 1

## 2024-01-19 NOTE — Plan of Care (Signed)

## 2024-01-19 NOTE — PMR Pre-admission (Incomplete)
 PMR Admission Coordinator Pre-Admission Assessment  Patient: Sarah [name] is an 73 y.o., female MRN: [id number] DOB: 03-Feb-1951 Height: 5' 3 (160 cm) Weight: 81.7 kg  Insurance Information HMO: yes    PPO:      PCP:      IPA:      80/20:      OTHER:  PRIMARY: [employer] Medicare      Policy#: [id number]      Subscriber: pt CM Name: ***      Phone#: [phone]     Fax#: [phone] Pre-Cert#: [id number]  auth for CIR from *** with *** for admit *** with next review date ***.  Updates due to *** at fax listed above.        Employer:  Benefits:  Phone #: [phone]     Name: *** Eff. Date: ***     Deduct: ***      Out of Pocket Max: ***      Life Max: *** CIR: ***      SNF: *** Outpatient: ***     Co-Pay: *** Home Health: ***      Co-Pay: *** DME: ***     Co-Pay: *** Providers: *** SECONDARY: ***      Policy#: ***     Phone#: ***  Financial Counselor: ***      Phone#: ***  The "Data Collection Information Summary" for patients in Inpatient Rehabilitation Facilities with attached "Privacy Act Statement-Health Care Records" was provided and verbally reviewed with: {CHL IP Patient Family WJ:695449998}  Emergency Contact Information Contact Information     Name Relation Home Work Mobile   [place] Friend [phone]        Other Contacts   None on File     Current Medical History  Patient Admitting Diagnosis: *** History of Present Illness: ***    Patient's medical record from *** has been reviewed by the rehabilitation admission coordinator and physician.  Past Medical History  Past Medical History:  Diagnosis Date   Arthritis    Asthma    CAP (community acquired pneumonia) 08/11/2022   Depression    Dyspnea    with heat   Environmental and seasonal allergies    GERD (gastroesophageal reflux disease)    Hyperlipidemia    Hypertension    Hypothyroidism    Leukemia (HCC)    Presence of dental prosthetic device    Dental implants   Wheezing     Has the  patient had major surgery during 100 days prior to admission? {Yes/No/Unknown:304600602}  Family History   family history includes Diabetes in her mother; Heart disease in her mother; Hyperlipidemia in her mother.  Current Medications  Current Facility-Administered Medications:    acetaminophen  (TYLENOL ) tablet 650 mg, 650 mg, Oral, Q6H PRN **OR** acetaminophen  (TYLENOL ) suppository 650 mg, 650 mg, Rectal, Q6H PRN, Franchot Novel, MD   amLODipine  (NORVASC ) tablet 5 mg, 5 mg, Oral, Daily, Grubb, Rodney D, RPH, 5 mg at 01/19/24 9065   budesonide -glycopyrrolate -formoterol  (BREZTRI ) 160-9-4.8 MCG/ACT inhaler 2 puff, 2 puff, Inhalation, BID, Josette Ade, MD, 2 puff at 01/19/24 0934   Chlorhexidine  Gluconate Cloth 2 % PADS 6 each, 6 each, Topical, Daily, Nelson, Dana G, NP, 6 each at 01/17/24 9195   citalopram  (CELEXA ) tablet 20 mg, 20 mg, Oral, Daily, Nada Adriana BIRCH, RPH, 20 mg at 01/19/24 0934   feeding supplement (ENSURE PLUS HIGH PROTEIN) liquid 237 mL, 237 mL, Oral, BID BM, Assaker, Jean-Pierre, MD, 237 mL at 01/19/24 0935   guaiFENesin -dextromethorphan  (ROBITUSSIN DM)  100-10 MG/5ML syrup 5 mL, 5 mL, Oral, Q4H PRN, Franchot Novel, MD, 5 mL at 01/13/24 2206   heparin  injection 5,000 Units, 5,000 Units, Subcutaneous, Q8H, Assaker, Jean-Pierre, MD, 5,000 Units at 01/19/24 0533   ipratropium-albuterol  (DUONEB) 0.5-2.5 (3) MG/3ML nebulizer solution 3 mL, 3 mL, Nebulization, BID, Wieting, Richard, MD   levothyroxine  (SYNTHROID ) tablet 50 mcg, 50 mcg, Oral, Q0600, Nada Adriana BIRCH, RPH, 50 mcg at 01/19/24 0533   loratadine  (CLARITIN ) tablet 10 mg, 10 mg, Oral, Daily, Nada Adriana BIRCH, RPH, 10 mg at 01/19/24 9065   metoCLOPramide  (REGLAN ) injection 10 mg, 10 mg, Intravenous, Q6H PRN, Mansy, Jan A, MD   multivitamin with minerals tablet 1 tablet, 1 tablet, Oral, Daily, Nada Adriana BIRCH, RPH, 1 tablet at 01/19/24 9065   ondansetron  (ZOFRAN ) injection 4 mg, 4 mg, Intravenous, Q4H PRN, Mansy, Jan A,  MD   Oral care mouth rinse, 15 mL, Mouth Rinse, PRN, Assaker, Darrin, MD   pantoprazole  (PROTONIX ) injection 40 mg, 40 mg, Intravenous, Q12H, Franchot Novel, MD, 40 mg at 01/19/24 9065   piperacillin -tazobactam (ZOSYN ) IVPB 3.375 g, 3.375 g, Intravenous, Q8H, Assaker, Jean-Pierre, MD, Stopped at 01/19/24 0859   polyethylene glycol powder (GLYCOLAX /MIRALAX ) container 238 g, 238 g, Oral, Once PRN, Russo, Steven Michael, DO   traZODone  (DESYREL ) tablet 50 mg, 50 mg, Oral, QHS PRN, Rust-Chester, Jenita CROME, NP, 50 mg at 01/18/24 2311  Patients Current Diet:  Diet Order             Diet regular Room service appropriate? Yes with Assist; Fluid consistency: Thin  Diet effective now                   Precautions / Restrictions Precautions Precautions: Fall Restrictions Weight Bearing Restrictions Per Provider Order: No   Has the patient had 2 or more falls or a fall with injury in the past year? {Yes/No/Unknown:304600602}  Prior Activity Level Community (5-7x/wk): independent with no device, driving  Prior Functional Level Self Care: Did the patient need help bathing, dressing, using the toilet or eating? {Prior Functional Ozczo:695399399}  Indoor Mobility: Did the patient need assistance with walking from room to room (with or without device)? {Prior Functional Ozczo:695399399}  Stairs: Did the patient need assistance with internal or external stairs (with or without device)? {Prior Functional Ozczo:695399399}  Functional Cognition: Did the patient need help planning regular tasks such as shopping or remembering to take medications? {Prior Functional Ozczo:695399399}  Patient Information Are you of Hispanic, Latino/a,or Spanish origin?: A. No, not of Hispanic, Latino/a, or Spanish origin What is your race?: A. White Do you need or want an interpreter to communicate with a doctor or health care staff?: 0. No  Patient's Response To:  Health Literacy and Transportation Is  the patient able to respond to health literacy and transportation needs?: Yes Health Literacy - How often do you need to have someone help you when you read instructions, pamphlets, or other written material from your doctor or pharmacy?: Never In the past 12 months, has lack of transportation kept you from medical appointments or from getting medications?: No In the past 12 months, has lack of transportation kept you from meetings, work, or from getting things needed for daily living?: No  Home Assistive Devices / Equipment Home Equipment: Agricultural consultant (2 wheels), Toilet riser, [place] - single point, [place] - quad, Information systems manager, Grab bars - tub/shower  Prior Device Use: Indicate devices/aids used by the patient prior to current illness, exacerbation or injury? {Prior Device  Ldzi:695399398}  Current Functional Level Cognition  Orientation Level: Oriented X4    Extremity Assessment (includes Sensation/Coordination)  Upper Extremity Assessment: Generalized weakness  Lower Extremity Assessment: Generalized weakness    ADLs  Overall ADL's : Needs assistance/impaired Toilet Transfer: Minimal assistance, Moderate assistance, Rolling walker (2 wheels), BSC/3in1 Toileting- Clothing Manipulation and Hygiene: Maximal assistance, Sit to/from stand Toileting - Clothing Manipulation Details (indicate cue type and reason): for hygiene and clothing management    Mobility  Overal bed mobility: Needs Assistance Bed Mobility: Supine to Sit, Sit to Supine Rolling: Supervision, Contact guard assist Sidelying to sit: Min assist, HOB elevated, Used rails Supine to sit: Min assist Sit to supine: Contact guard assist General bed mobility comments: increased time required. mild trunk support for sitting upright    Transfers  Overall transfer level: Needs assistance Equipment used: Rolling walker (2 wheels) Transfers: Sit to/from Stand Sit to Stand: Contact guard assist Bed to/from chair/wheelchair/BSC  transfer type:: Step pivot Step pivot transfers: Contact guard assist, Min assist, +2 safety/equipment, +2 physical assistance General transfer comment: cues for safety    Ambulation / Gait / Stairs / Wheelchair Mobility  Ambulation/Gait Ambulation/Gait assistance: Contact guard assist Gait Distance (Feet):  (65ft, 37ft) Assistive device: Rolling walker (2 wheels) Gait Pattern/deviations: Step-through pattern, Decreased stride length General Gait Details: education on energy conservation and taking rest breaks as needed with mobility. Sp02 in the mid 90's on 2 L02 with ambulation Gait velocity: decreased    Posture / Balance Balance Overall balance assessment: Needs assistance Sitting-balance support: Feet supported, Bilateral upper extremity supported Sitting balance-Leahy Scale: Fair Standing balance support: Bilateral upper extremity supported, Reliant on assistive device for balance, During functional activity Standing balance-Leahy Scale: Poor Standing balance comment: relying on rolling walker for support with standing    Special considerations/life events  {Special Care Needs/Care Considerations:304600603}   Previous Home Environment (from acute therapy documentation) Living Arrangements:  (roommate?) Available Help at Discharge: Family, Friend(s), Neighbor Type of Home: Mobile home Home Layout: One level Home Access: Stairs to enter Entrance Stairs-Rails: Can reach both, Left, Right Entrance Stairs-Number of Steps: 4 Bathroom Shower/Tub: Tub/shower unit, Health visitor: Handicapped height Bathroom Accessibility: Yes How Accessible: Accessible via walker Home Care Services: No  Discharge Living Setting Plans for Discharge Living Setting: Patient's home, Lives with (comment) (roommate, Barbara) Type of Home at Discharge: Mobile home Discharge Home Layout: One level Discharge Home Access: Stairs to enter Entrance Stairs-Rails: Can reach both Entrance  Stairs-Number of Steps: 4 Discharge Bathroom Shower/Tub: Tub/shower unit Discharge Bathroom Toilet: Handicapped height Discharge Bathroom Accessibility: Yes How Accessible: Accessible via walker Does the patient have any problems obtaining your medications?: No  Social/Family/Support Systems Anticipated Caregiver: roommate Heron, friend Scientist, forensic, and neighbor [employer] Anticipated Caregiver's Contact Information: [place]  [phone] Ability/Limitations of Caregiver: supervision only from [place] and Heron, Bari is a CNA and could provide some PRN physical assist if needed Caregiver Availability: 24/7 Discharge Plan Discussed with Primary Caregiver: Yes Is Caregiver In Agreement with Plan?: Yes  Goals Patient/Family Goal for Rehab: PT/OT supervision to mod I, SLP n/a Expected length of stay: 8-11 days Additional Information: Discharge plan: home with support from roommate, neighbor, and friend Pt/Family Agrees to Admission and willing to participate: Yes Program Orientation Provided & Reviewed with Pt/Caregiver Including Roles  & Responsibilities: Yes  Decrease burden of Care through IP rehab admission: {Inpatient Rehab Care:20780}  Possible need for SNF placement upon discharge: ***  Patient Condition: {PATIENT'S CONDITION:22832}  Preadmission Screen Completed By:  Reche FORBES Lowers, 01/19/2024 12:17 PM ______________________________________________________________________   Discussed status with Dr. PIERRETTE on *** at *** and received approval for admission today.  Admission Coordinator:  Aneya Daddona E Kamesha Herne, PT, time PIERRETTEPattricia ***   Assessment/Plan: Diagnosis: *** Does the need for close, 24 hr/day Medical supervision in concert with the patient's rehab needs make it unreasonable for this patient to be served in a less intensive setting? {yes_no_potentially:3041433} Co-Morbidities requiring supervision/potential complications: *** Due to {due un:6958565}, does the patient require 24  hr/day rehab nursing? {yes_no_potentially:3041433} Does the patient require coordinated care of a physician, rehab nurse, PT, OT, and SLP to address physical and functional deficits in the context of the above medical diagnosis(es)? {yes_no_potentially:3041433} Addressing deficits in the following areas: {deficits:3041436} Can the patient actively participate in an intensive therapy program of at least 3 hrs of therapy 5 days a week? {yes_no_potentially:3041433} The potential for patient to make measurable gains while on inpatient rehab is {potential:3041437} Anticipated functional outcomes upon discharge from inpatient rehab: {functional outcomes:304600100} PT, {functional outcomes:304600100} OT, {functional outcomes:304600100} SLP Estimated rehab length of stay to reach the above functional goals is: *** Anticipated discharge destination: {anticipated dc setting:21604} 10. Overall Rehab/Functional Prognosis: {potential:3041437}   MD Signature: ***

## 2024-01-19 NOTE — Plan of Care (Signed)
 Continuing with plan of care.

## 2024-01-19 NOTE — Progress Notes (Signed)
 Progress Note   Patient: Sarah [name] FMW:[id number] DOB: 1950-05-20 DOA: 01/12/2024     7 DOS: the patient was seen and examined on 01/19/2024   Brief hospital course: 73 yo female who presented to [facility] ER on 09/19 with c/o LLQ abdominal pain with black stool onset several days prior to presentation.  Per ED notes on 09/16 pt was sent over from the cancer center to the ER with abdominal pain and dark tarry stools.  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  She received metronidazole  and ceftriaxone .  EDP recommended hospital admission, however pt refused.  She was discharged from the ER and prescribed Augmentin  and instructed to follow-up with her PCP.     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  However, treatment put on hold since 09/18 per oncologist recommendations due to colitis dx.  She has chronic dyspnea at baseline, but reports this has worsened along with worsening bilateral lower extremity edema she suspected this is due to chemotherapy treatment.    ED Course  Upon arrival to the ER during this presentation pt reported worsening abdominal pain, nausea, and poor po intake despite compliance with Augmentin .  Significant lab results: K+ 3.3/calcium  8.6/total bilirubin 1.4/hgb 9.5/platelet count 126.  CXR negative for acute cardiopulmonary disease.  Pt received ceftriaxone /metronidazole /4 mg of iv morphine /1L NS bolus.  Pt admitted to the [facility] per hospitalist team for additional workup and treatment.   09/19: Admitted to the medsurg unit with acute GI bleed and proctocolitis  09/20: GI consulted and scheduled EGD and colonoscopy with possible biopsy on 09/22 09/22: Pt ingested bowel prep overnight and developed acute hypoxic respiratory failure secondary to aspiration following multiple episodes of emesis requiring transfer to ICU and mechanical intubation  09/23: Patient extubated to HFNC  and alternating with Bipap.   9/25.  Medical team took over case.  Patient on 100% heated high flow nasal cannula, 30 L flow.  Apparently later in the afternoon there was a kink in the heated high flow nasal cannula which just made it appear like she was on 100% oxygen.  Tapered oxygen over the day. 9/26.  Patient complains of coughing up a little blood.  Down to 2 L nasal cannula this morning and over to room air this afternoon.  Assessment and Plan: * Acute respiratory failure with hypoxia (HCC) Patient required intubation and mechanical ventilation.  Patient was extubated on 9/23.  This morning patient was on 2 L of oxygen and this afternoon now off oxygen.  Aspiration pneumonia (HCC) On Zosyn .  Will switch over to Augmentin  for 5 more doses starting this evening.  COPD with acute exacerbation (HCC) Completed Solu-Medrol  and on Breztri  inhaler  Acute on chronic blood loss anemia Current hemoglobin 7.9. She has history of myelodysplastic syndrome.  Proctocolitis Stool studies are negative  Essential hypertension On Norvasc   MDS (myelodysplastic syndrome) (HCC) Follow-up with oncology as outpatient  Hypothyroidism, unspecified On levothyroxine   Anxiety and depression On Celexa   Hemoptysis Likely from pneumonia.  Could also be from the high flow nasal cannula with irritation for forcing air in.  Now on room air.  Continue to monitor.  Hypokalemia Replace potassium  AKI (acute kidney injury) Creatinine 0.65 at its lowest point.  Creatinine peaked at 1.6.  Last creatinine 1.16.        Subjective: Patient did cough up a little blood this a.m.  Improved oxygenation and down to 2 L of oxygen this  morning and able to come off oxygen this afternoon.  Physical Exam: Vitals:   01/19/24 1100 01/19/24 1200 01/19/24 1321 01/19/24 1611  BP:   (!) 136/52 134/65  Pulse: 71 72 79 79  Resp: 20 (!) 25 16 17   Temp:   98.1 F (36.7 C) 98.1 F (36.7 C)  TempSrc:   Oral   SpO2:  96% 97% 97% 96%  Weight:      Height:       Physical Exam HENT:     Head: Normocephalic.  Eyes:     General: Lids are normal.     Conjunctiva/sclera: Conjunctivae normal.  Cardiovascular:     Rate and Rhythm: Normal rate and regular rhythm.     Heart sounds: Normal heart sounds, S1 normal and S2 normal.  Pulmonary:     Breath sounds: Examination of the right-lower field reveals decreased breath sounds and rhonchi. Examination of the left-lower field reveals decreased breath sounds and rhonchi. Decreased breath sounds and rhonchi present. No wheezing or rales.  Abdominal:     Palpations: Abdomen is soft.     Tenderness: There is no abdominal tenderness.  Musculoskeletal:     Right lower leg: No swelling.     Left lower leg: No swelling.  Skin:    General: Skin is warm.     Findings: No rash.  Neurological:     Mental Status: She is alert and oriented to person, place, and time.     Data Reviewed: Last creatinine 1.1, last hemoglobin 7.9  Family Communication: Spoke with Lenore at the bedside  Disposition: Status is: Inpatient Remains inpatient appropriate because: Awaiting insurance authorization for acute inpatient rehab  Planned Discharge Destination: Acute inpatient rehab    Time spent: 28 minutes  Author: Charlie Patterson, MD 01/19/2024 5:27 PM  For on call review [email or web address].

## 2024-01-19 NOTE — Progress Notes (Signed)
 Inpatient Rehab Admissions Coordinator:   Awaiting determination on CIR prior auth request from [facility] Medicare.  Will follow.    Reche Lowers, PT, DPT Admissions Coordinator [phone] 01/19/24  12:11 PM

## 2024-01-19 NOTE — Assessment & Plan Note (Signed)
 Keep appointment with oncology as outpatient.

## 2024-01-19 NOTE — Progress Notes (Signed)
 Physical Therapy Treatment Patient Details Name: Sarah [name] MRN: [id number] DOB: [birth year] Today's Date: 01/19/2024   History of Present Illness Pt is a 73 y/o F admitted on 01/12/24 after presenting with c/o LLQ abdominal pain with black stool. Pt with CT on 01/09/24 concerning for proctosigmoid colitis. GI was consulted & EGD & colonoscopy scheduled for 9/22. Pt ingested bowel prep over night & developed acute hypoxic respiratory failure 2/2 aspiration following multiple episodes of emesis requiring transfer to ICU & intubation; pt extubated 01/16/24. PMH: arthritis, asthma, depression, dyspnea, GERD, HLD, HTN, hypothyroidism, CML    PT Comments  Patient reports feeling better overall but not back to her baseline level of independence. Increased activity tolerance this session. Patient walked in hallway with rolling walker with cues for energy conservation. Sp02 in the mid 90's with activity on 2 L02 with no significant dyspnea noted with activity. Recommend to continue PT to maximize independence and decrease caregiver burden.    If plan is discharge home, recommend the following: A lot of help with bathing/dressing/bathroom;Assistance with feeding;Assist for transportation;A little help with walking and/or transfers;Assistance with cooking/housework;Direct supervision/assist for medications management;Help with stairs or ramp for entrance;Direct supervision/assist for financial management   Can travel by private vehicle        Equipment Recommendations  BSC/3in1;Rolling walker (2 wheels)    Recommendations for Other Services       Precautions / Restrictions Precautions Precautions: Fall Recall of Precautions/Restrictions: Intact Restrictions Weight Bearing Restrictions Per Provider Order: No     Mobility  Bed Mobility Overal bed mobility: Needs Assistance Bed Mobility: Supine to Sit, Sit to Supine     Supine to sit: Min assist Sit to supine: Contact guard assist   General  bed mobility comments: increased time required. mild trunk support for sitting upright    Transfers Overall transfer level: Needs assistance Equipment used: Rolling walker (2 wheels) Transfers: Sit to/from Stand Sit to Stand: Contact guard assist           General transfer comment: cues for safety    Ambulation/Gait Ambulation/Gait assistance: Contact guard assist Gait Distance (Feet):  (69ft, 24ft) Assistive device: Rolling walker (2 wheels) Gait Pattern/deviations: Step-through pattern, Decreased stride length Gait velocity: decreased     General Gait Details: education on energy conservation and taking rest breaks as needed with mobility. Sp02 in the mid 90's on 2 L02 with ambulation   Stairs             Wheelchair Mobility     Tilt Bed    Modified Rankin (Stroke Patients Only)       Balance Overall balance assessment: Needs assistance Sitting-balance support: Feet supported, Bilateral upper extremity supported Sitting balance-Leahy Scale: Fair     Standing balance support: Bilateral upper extremity supported, Reliant on assistive device for balance, During functional activity Standing balance-Leahy Scale: Poor Standing balance comment: relying on rolling walker for support with standing                            Communication Communication Communication: No apparent difficulties  Cognition Arousal: Alert Behavior During Therapy: WFL for tasks assessed/performed   PT - Cognitive impairments: Safety/Judgement                       PT - Cognition Comments: occasional safety cues with mobility Following commands: Intact      Cueing Cueing Techniques: Verbal cues  Exercises  General Comments General comments (skin integrity, edema, etc.): supportive friend present at end of session      Pertinent Vitals/Pain Pain Assessment Pain Assessment: No/denies pain    Home Living                          Prior  Function            PT Goals (current goals can now be found in the care plan section) Acute Rehab PT Goals Patient Stated Goal: get better PT Goal Formulation: With patient/family Time For Goal Achievement: 01/31/24 Potential to Achieve Goals: Good Progress towards PT goals: Progressing toward goals    Frequency    Min 3X/week      PT Plan      Co-evaluation              AM-PAC PT 6 Clicks Mobility   Outcome Measure  Help needed turning from your back to your side while in a flat bed without using bedrails?: A Little Help needed moving from lying on your back to sitting on the side of a flat bed without using bedrails?: A Little Help needed moving to and from a bed to a chair (including a wheelchair)?: A Little Help needed standing up from a chair using your arms (e.g., wheelchair or bedside chair)?: A Little Help needed to walk in hospital room?: A Little Help needed climbing 3-5 steps with a railing? : A Lot 6 Click Score: 17    End of Session Equipment Utilized During Treatment: Oxygen Activity Tolerance: Patient tolerated treatment well Patient left: in bed;with call bell/phone within reach;with family/visitor present Nurse Communication: Mobility status PT Visit Diagnosis: Unsteadiness on feet (R26.81);Muscle weakness (generalized) (M62.81);Difficulty in walking, not elsewhere classified (R26.2);Other abnormalities of gait and mobility (R26.89)     Time: 9051-8990 PT Time Calculation (min) (ACUTE ONLY): 21 min  Charges:    $Therapeutic Activity: 8-22 mins PT General Charges $$ ACUTE PT VISIT: 1 Visit                     Sarah [name], PT, MPT    Sarah [name] 01/19/2024, 10:29 AM

## 2024-01-19 NOTE — Progress Notes (Signed)
 Report given to receiving nurse Gareth, RN

## 2024-01-19 NOTE — Progress Notes (Signed)
 Occupational Therapy Treatment Patient Details Name: Sarah [name] MRN: [id number] DOB: 1950-06-24 Today's Date: 01/19/2024   History of present illness Pt is a 73 y/o F admitted on 01/12/24 after presenting with c/o LLQ abdominal pain with black stool. Pt with CT on 01/09/24 concerning for proctosigmoid colitis. GI was consulted & EGD & colonoscopy scheduled for 9/22. Pt ingested bowel prep over night & developed acute hypoxic respiratory failure 2/2 aspiration following multiple episodes of emesis requiring transfer to ICU & intubation; pt extubated 01/16/24. PMH: arthritis, asthma, depression, dyspnea, GERD, HLD, HTN, hypothyroidism, CML   OT comments  Upon entering the room, pt supine in bed and agreeable to OT intervention. Pt performing supine >sit with min A to EOB. Pt standing with min HHA and ambulates without RW into bathroom with min HHA. Pt having BM on standard toilet. She stands for hygiene with min guard for balance and then ambulates to sink for hygiene with min guard. Pt returning back to bed ambulating 20' in same manner. Sit >supine with min guard. Call bell and all needed items within reach upon exiting room. OT did remove O2 in an attempt to wean but pt dropped to 85% on RA with self care tasks and needing to be placed back on 2Ls via [place].       If plan is discharge home, recommend the following:  A lot of help with walking and/or transfers;A lot of help with bathing/dressing/bathroom;Assist for transportation;Assistance with cooking/housework;Help with stairs or ramp for entrance   Equipment Recommendations  Other (comment) (defer to next venue of care)       Precautions / Restrictions Precautions Precautions: Fall Recall of Precautions/Restrictions: Intact Restrictions Weight Bearing Restrictions Per Provider Order: No       Mobility Bed Mobility Overal bed mobility: Needs Assistance Bed Mobility: Supine to Sit, Sit to Supine     Supine to sit: Min assist Sit to  supine: Contact guard assist        Transfers Overall transfer level: Needs assistance   Transfers: Sit to/from Stand Sit to Stand: Contact guard assist                 Balance Overall balance assessment: Needs assistance Sitting-balance support: Feet supported, Bilateral upper extremity supported Sitting balance-Leahy Scale: Fair     Standing balance support: Bilateral upper extremity supported, Reliant on assistive device for balance, During functional activity Standing balance-Leahy Scale: Poor                             ADL either performed or assessed with clinical judgement   ADL Overall ADL's : Needs assistance/impaired     Grooming: Minimal assistance;Standing                   Toilet Transfer: Minimal assistance;Regular Toilet   Toileting- Clothing Manipulation and Hygiene: Minimal assistance;Sit to/from stand       Functional mobility during ADLs: Minimal assistance      Extremity/Trunk Assessment Upper Extremity Assessment Upper Extremity Assessment: Generalized weakness   Lower Extremity Assessment Lower Extremity Assessment: Generalized weakness        Vision Patient Visual Report: No change from baseline           Communication Communication Communication: No apparent difficulties   Cognition Arousal: Alert Behavior During Therapy: WFL for tasks assessed/performed Cognition: No apparent impairments  Following commands: Intact        Cueing   Cueing Techniques: Verbal cues  Exercises              Pertinent Vitals/ Pain       Pain Assessment Pain Assessment: No/denies pain         Frequency  Min 2X/week        Progress Toward Goals  OT Goals(current goals can now be found in the care plan section)  Progress towards OT goals: Progressing toward goals      AM-PAC OT 6 Clicks Daily Activity     Outcome Measure   Help from another person eating  meals?: None Help from another person taking care of personal grooming?: A Little Help from another person toileting, which includes using toliet, bedpan, or urinal?: A Little Help from another person bathing (including washing, rinsing, drying)?: A Little Help from another person to put on and taking off regular upper body clothing?: A Little Help from another person to put on and taking off regular lower body clothing?: A Little 6 Click Score: 19    End of Session Equipment Utilized During Treatment: Oxygen  OT Visit Diagnosis: Other abnormalities of gait and mobility (R26.89);Muscle weakness (generalized) (M62.81)   Activity Tolerance Patient tolerated treatment well   Patient Left with call bell/phone within reach;in bed;with bed alarm set   Nurse Communication          Time: 8651-8587 OT Time Calculation (min): 24 min  Charges: OT General Charges $OT Visit: 1 Visit OT Treatments $Self Care/Home Management : 8-22 mins $Therapeutic Activity: 8-22 mins  Izetta Claude, MS, OTR/L , CBIS ascom [phone]  01/19/24, 3:01 PM

## 2024-01-19 NOTE — Assessment & Plan Note (Addendum)
 Likely from pneumonia.  Could also be from the high flow nasal cannula with irritation for forcing air in.  Now on room air.  No further coughing up blood

## 2024-01-19 NOTE — H&P (Incomplete)
 Physical Medicine and Rehabilitation Admission H&P    Chief Complaint  Patient presents with   Abdominal Pain  : HPI: ***  ROS Past Medical History:  Diagnosis Date   Arthritis    Asthma    CAP (community acquired pneumonia) 08/11/2022   Depression    Dyspnea    with heat   Environmental and seasonal allergies    GERD (gastroesophageal reflux disease)    Hyperlipidemia    Hypertension    Hypothyroidism    Leukemia (HCC)    Presence of dental prosthetic device    Dental implants   Wheezing    Past Surgical History:  Procedure Laterality Date   ABDOMINAL HYSTERECTOMY  04/26/1987   ANTERIOR CERVICAL DECOMPRESSION/DISCECTOMY FUSION 4 LEVELS N/A 11/10/2021   Procedure: C3-7 ANTERIOR CERVICAL DISCECTOMY AND FUSION (GLOBUS FORGE);  Surgeon: Clois Fret, MD;  Location: [facility];  Service: Neurosurgery;  Laterality: N/A;   BROW LIFT Bilateral 02/07/2020   Procedure: BLEPHAROPLASTY UPPER EYELID; W/EXCESS SKIN BROW PTOSIS REPAIR BILATERAL;  Surgeon: Ashley Greig HERO, MD;  Location: [facility];  Service: Ophthalmology;  Laterality: Bilateral;   CATARACT EXTRACTION W/PHACO Right 09/13/2016   Procedure: CATARACT EXTRACTION PHACO AND INTRAOCULAR LENS PLACEMENT (IOC);  Surgeon: Jaye Fallow, MD;  Location: [facility];  Service: Ophthalmology;  Laterality: Right;  US  00:38 AP% 14.3 CDE 5.51 Fluid pack lot # [id number]   CATARACT EXTRACTION W/PHACO Left 10/11/2016   Procedure: CATARACT EXTRACTION PHACO AND INTRAOCULAR LENS PLACEMENT (IOC);  Surgeon: Jaye Fallow, MD;  Location: [facility];  Service: Ophthalmology;  Laterality: Left;  US  00:30 AP% 11.3 CDE 3.50 fluid pack lot # [id number]   COLONOSCOPY WITH PROPOFOL  N/A 09/30/2014   Procedure: COLONOSCOPY WITH PROPOFOL ;  Surgeon: Reyes LELON Cota, MD;  Location: [facility];  Service: Endoscopy;  Laterality: N/A;   COLONOSCOPY WITH PROPOFOL  N/A 03/24/2021   Procedure: COLONOSCOPY WITH PROPOFOL ;  Surgeon: Unk Corinn Skiff, MD;  Location: [facility];  Service: Gastroenterology;  Laterality: N/A;   ESOPHAGOGASTRODUODENOSCOPY N/A 09/30/2014   Procedure: ESOPHAGOGASTRODUODENOSCOPY (EGD);  Surgeon: Reyes LELON Cota, MD;  Location: [facility];  Service: Endoscopy;  Laterality: N/A;   ESOPHAGOGASTRODUODENOSCOPY N/A 03/24/2021   Procedure: ESOPHAGOGASTRODUODENOSCOPY (EGD);  Surgeon: Unk Corinn Skiff, MD;  Location: [facility];  Service: Gastroenterology;  Laterality: N/A;   FOOT SURGERY Bilateral 04/26/1983   KNEE SURGERY  04/25/2014   Family History  Problem Relation Age of Onset   Diabetes Mother    Hyperlipidemia Mother    Heart disease Mother    Prostate cancer Neg Hx    Kidney cancer Neg Hx    Bladder Cancer Neg Hx    Social History:  reports that she has never smoked. She has never used smokeless tobacco. She reports that she does not drink alcohol and does not use drugs. Allergies: No Known Allergies Medications Prior to Admission  Medication Sig Dispense Refill   albuterol  (VENTOLIN  HFA) 108 (90 Base) MCG/ACT inhaler Inhale 1-2 puffs into the lungs every 6 (six) hours as needed for wheezing or shortness of breath. 8.5 g 11   amLODipine  (NORVASC ) 5 MG tablet TAKE 1 TABLET BY MOUTH DAILY 100 tablet 2   aspirin  EC 81 MG tablet Take 81 mg by mouth daily. Swallow whole.     azithromycin  (ZITHROMAX ) 250 MG tablet Take 1 tablet (250 mg total) by mouth 3 (three) times a week. M-W-F 12 each 3   Calcium  Citrate-Vitamin D  (CALCIUM  CITRATE + D PO) Take 1 tablet by mouth 2 (  two) times daily.     citalopram  (CELEXA ) 20 MG tablet TAKE 1 TABLET BY MOUTH DAILY 100 tablet 2   famotidine  (PEPCID ) 20 MG tablet TAKE 1 TABLET BY MOUTH DAILY. BEFORE DINNER 30 tablet 11   ferrous sulfate  324 MG TBEC Take 2 tablets by mouth. 65 mg of elemental iron      fexofenadine (ALLEGRA) 180 MG tablet Take 180 mg by mouth daily.     gabapentin  (NEURONTIN ) 100 MG capsule TAKE 1 CAPSULE BY MOUTH 3 TIMES  DAILY 300  capsule 2   lenalidomide  (REVLIMID ) 10 MG capsule Take 1 capsule (10 mg total) by mouth daily. Take for 21 days, then hold for 7 days. Repeat every 28 days. 21 capsule 0   levothyroxine  (SYNTHROID ) 50 MCG tablet TAKE 1 TABLET BY MOUTH DAILY ON  AN EMPTY STOMACH WITH A GLASS OF WATER  AT LEAST 30 TO 60 MINUTES  BEFORE BREAKFAST 100 tablet 2   pantoprazole  (PROTONIX ) 40 MG tablet TAKE 1 TABLET BY MOUTH DAILY 100 tablet 2   rosuvastatin  (CRESTOR ) 10 MG tablet TAKE ONE TABLET BY MOUTH ONCE DAILY 100 tablet 1   solifenacin  (VESICARE ) 5 MG tablet Take 1 tablet (5 mg total) by mouth daily. 30 tablet 2   traZODone  (DESYREL ) 50 MG tablet TAKE 1/2 TO 1 TABLET BY MOUTH AT BEDTIME AS NEEDED FOR SLEEP 90 tablet 3   vitamin B-12 (CYANOCOBALAMIN ) 1000 MCG tablet Take 1,000 mcg by mouth daily.     Fluticasone -Umeclidin-Vilant (TRELEGY ELLIPTA ) 200-62.5-25 MCG/ACT AEPB Inhale 1 puff into the lungs daily. 28 each 0   ondansetron  (ZOFRAN ) 8 MG tablet One pill every 8 hours as needed for nausea/vomitting. (Patient not taking: No sig reported) 40 tablet 1      Home: Home Living Family/patient expects to be discharged to:: Private residence Living Arrangements:  (roommate?) Available Help at Discharge: Family, Friend(s), Neighbor Type of Home: Mobile home Home Access: Stairs to enter Secretary/administrator of Steps: 4 Entrance Stairs-Rails: Can reach both, Left, Right Home Layout: One level Bathroom Shower/Tub: Tub/shower unit, Health visitor: Handicapped height Bathroom Accessibility: Yes Home Equipment: Agricultural consultant (2 wheels), Toilet riser, Medical laboratory scientific officer - single point, [employer] - quad, Information systems manager, Grab bars - tub/shower   Functional History: Prior Function Prior Level of Function : Independent/Modified Independent, Driving Mobility Comments: ambulatory without AD, denies falls, helps to take care of her roommate/friend  Functional Status:  Mobility: Bed Mobility Overal bed mobility: Needs  Assistance Bed Mobility: Supine to Sit, Sit to Supine Rolling: Supervision, Contact guard assist Sidelying to sit: Min assist, HOB elevated, Used rails Supine to sit: Min assist Sit to supine: Contact guard assist General bed mobility comments: increased time required. mild trunk support for sitting upright Transfers Overall transfer level: Needs assistance Equipment used: Rolling walker (2 wheels) Transfers: Sit to/from Stand Sit to Stand: Contact guard assist Bed to/from chair/wheelchair/BSC transfer type:: Step pivot Step pivot transfers: Contact guard assist, Min assist, +2 safety/equipment, +2 physical assistance General transfer comment: cues for safety Ambulation/Gait Ambulation/Gait assistance: Contact guard assist Gait Distance (Feet):  (57ft, 7ft) Assistive device: Rolling walker (2 wheels) Gait Pattern/deviations: Step-through pattern, Decreased stride length General Gait Details: education on energy conservation and taking rest breaks as needed with mobility. Sp02 in the mid 90's on 2 L02 with ambulation Gait velocity: decreased    ADL: ADL Overall ADL's : Needs assistance/impaired Grooming: Minimal assistance, Standing Toilet Transfer: Minimal assistance, Regular Toilet Toileting- Clothing Manipulation and Hygiene: Minimal assistance, Sit to/from stand Toileting -  Clothing Manipulation Details (indicate cue type and reason): for hygiene and clothing management Functional mobility during ADLs: Minimal assistance  Cognition: Cognition Orientation Level: Oriented X4 Cognition Arousal: Alert Behavior During Therapy: WFL for tasks assessed/performed  Physical Exam: Blood pressure 134/65, pulse 79, temperature 98.1 F (36.7 C), resp. rate 17, height 5' 3 (1.6 m), weight 81.7 kg, SpO2 96%. Physical Exam  Results for orders placed or performed during the hospital encounter of 01/12/24 (from the past 48 hours)  Glucose, capillary     Status: Abnormal   Collection  Time: 01/17/24  7:17 PM  Result Value Ref Range   Glucose-Capillary 142 (H) 70 - 99 mg/dL    Comment: Glucose reference range applies only to samples taken after fasting for at least 8 hours.  Hemoglobin and hematocrit, blood     Status: Abnormal   Collection Time: 01/17/24  9:37 PM  Result Value Ref Range   Hemoglobin 7.8 (L) 12.0 - 15.0 g/dL   HCT 76.1 (L) 63.9 - 53.9 %    Comment: Performed at [facility], [address] [place], [place] [address]  Magnesium     Status: Abnormal   Collection Time: 01/18/24  5:24 AM  Result Value Ref Range   Magnesium 2.5 (H) 1.7 - 2.4 mg/dL    Comment: Performed at [facility], [address] [place], [place] [address]  Renal function panel     Status: Abnormal   Collection Time: 01/18/24  5:24 AM  Result Value Ref Range   Sodium 141 135 - 145 mmol/L   Potassium 3.3 (L) 3.5 - 5.1 mmol/L   Chloride 96 (L) 98 - 111 mmol/L   CO2 32 22 - 32 mmol/L   Glucose, Bld 100 (H) 70 - 99 mg/dL    Comment: Glucose reference range applies only to samples taken after fasting for at least 8 hours.   BUN 24 (H) 8 - 23 mg/dL   Creatinine, Ser 8.83 (H) 0.44 - 1.00 mg/dL   Calcium  8.5 (L) 8.9 - 10.3 mg/dL   Phosphorus 4.1 2.5 - 4.6 mg/dL   Albumin 2.9 (L) 3.5 - 5.0 g/dL   GFR, Estimated 49 (L) >60 mL/min    Comment: (NOTE) Calculated using the CKD-EPI Creatinine Equation (2021)    Anion gap 13 5 - 15    Comment: Performed at [facility], [address] [place], [place] [address]  CBC     Status: Abnormal   Collection Time: 01/18/24  5:24 AM  Result Value Ref Range   WBC 11.5 (H) 4.0 - 10.5 K/uL   RBC 2.08 (L) 3.87 - 5.11 MIL/uL   Hemoglobin 7.4 (L) 12.0 - 15.0 g/dL   HCT 76.9 (L) 63.9 - 53.9 %   MCV 110.6 (H) 80.0 - 100.0 fL   MCH 35.6 (H) 26.0 - 34.0 pg   MCHC 32.2 30.0 - 36.0 g/dL   RDW 84.7 88.4 - 84.4 %   Platelets 167 150 - 400 K/uL   nRBC 0.0 0.0 - 0.2 %    Comment: Performed at [facility], [address], [place], [place] [address]  Renal function panel     Status: Abnormal   Collection Time: 01/19/24  3:55 AM  Result Value Ref Range   Sodium 142 135 - 145 mmol/L   Potassium 3.5 3.5 - 5.1 mmol/L   Chloride 100 98 - 111 mmol/L   CO2 32 22 - 32 mmol/L   Glucose, Bld 155 (H) 70 -  99 mg/dL    Comment: Glucose reference range applies only to samples taken after fasting for at least 8 hours.   BUN 23 8 - 23 mg/dL   Creatinine, Ser 8.89 (H) 0.44 - 1.00 mg/dL   Calcium  8.2 (L) 8.9 - 10.3 mg/dL   Phosphorus 2.8 2.5 - 4.6 mg/dL   Albumin 2.6 (L) 3.5 - 5.0 g/dL   GFR, Estimated 53 (L) >60 mL/min    Comment: (NOTE) Calculated using the CKD-EPI Creatinine Equation (2021)    Anion gap 10 5 - 15    Comment: Performed at [facility], [address] [place], [place] [address]  CBC     Status: Abnormal   Collection Time: 01/19/24  3:55 AM  Result Value Ref Range   WBC 5.7 4.0 - 10.5 K/uL   RBC 2.22 (L) 3.87 - 5.11 MIL/uL   Hemoglobin 7.9 (L) 12.0 - 15.0 g/dL   HCT 75.3 (L) 63.9 - 53.9 %   MCV 110.8 (H) 80.0 - 100.0 fL   MCH 35.6 (H) 26.0 - 34.0 pg   MCHC 32.1 30.0 - 36.0 g/dL   RDW 84.9 88.4 - 84.4 %   Platelets 167 150 - 400 K/uL   nRBC 0.0 0.0 - 0.2 %    Comment: Performed at [facility], [address], [place], [place] [address]  Magnesium     Status: None   Collection Time: 01/19/24  3:55 AM  Result Value Ref Range   Magnesium 2.2 1.7 - 2.4 mg/dL    Comment: Performed at [facility], [address] [place], [place] [address]  Type and screen [facility]     Status: None   Collection Time: 01/19/24  3:55 AM  Result Value Ref Range   ABO/RH(D) A POS    Antibody Screen NEG    Sample Expiration      01/22/2024,2359 Performed at [facility], [address] [place], [place] [address]    No results found.    Blood pressure 134/65, pulse 79, temperature 98.1 F (36.7 C), resp. rate 17, height 5' 3 (1.6  m), weight 81.7 kg, SpO2 96%.  Medical Problem List and Plan: 1. Functional deficits secondary to ***  -patient may *** shower  -ELOS/Goals: *** 2.  Antithrombotics: -DVT/anticoagulation:  {VTE PROPHYLAXIS/ANTICOAGULATION - UBEZ:[id number]}  -antiplatelet therapy: *** 3. Pain Management: *** 4. Mood/Behavior/Sleep: ***  -antipsychotic agents: *** 5. Neuropsych/cognition: This patient *** capable of making decisions on *** own behalf. 6. Skin/Wound Care: *** 7. Fluids/Electrolytes/Nutrition: ***     ***  Sharlet GORMAN Schmitz, PA-C 01/19/2024

## 2024-01-19 NOTE — Plan of Care (Signed)
  Problem: Clinical Measurements: Goal: Respiratory complications will improve Outcome: Progressing Goal: Cardiovascular complication will be avoided Outcome: Progressing   Problem: Activity: Goal: Risk for activity intolerance will decrease Outcome: Progressing   Problem: Skin Integrity: Goal: Risk for impaired skin integrity will decrease Outcome: Progressing

## 2024-01-20 DIAGNOSIS — J441 Chronic obstructive pulmonary disease with (acute) exacerbation: Secondary | ICD-10-CM | POA: Diagnosis not present

## 2024-01-20 DIAGNOSIS — J9601 Acute respiratory failure with hypoxia: Secondary | ICD-10-CM | POA: Diagnosis not present

## 2024-01-20 DIAGNOSIS — D62 Acute posthemorrhagic anemia: Secondary | ICD-10-CM | POA: Diagnosis not present

## 2024-01-20 DIAGNOSIS — K529 Noninfective gastroenteritis and colitis, unspecified: Secondary | ICD-10-CM | POA: Diagnosis not present

## 2024-01-20 LAB — HEMOGLOBIN: Hemoglobin: 9 g/dL — ABNORMAL LOW (ref 12.0–15.0)

## 2024-01-20 LAB — CREATININE, SERUM
Creatinine, Ser: 0.98 mg/dL (ref 0.44–1.00)
GFR, Estimated: >60 mL/min (ref >60)

## 2024-01-20 NOTE — Progress Notes (Signed)
 Progress Note   Patient: Sarah [name] FMW:[id number] DOB: 1950-04-30 DOA: 01/12/2024     8 DOS: the patient was seen and examined on 01/20/2024   Brief hospital course: 73 yo female who presented to [facility] ER on 09/19 with c/o LLQ abdominal pain with black stool onset several days prior to presentation.  Per ED notes on 09/16 pt was sent over from the cancer center to the ER with abdominal pain and dark tarry stools.  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  She received metronidazole  and ceftriaxone .  EDP recommended hospital admission, however pt refused.  She was discharged from the ER and prescribed Augmentin  and instructed to follow-up with her PCP.     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  However, treatment put on hold since 09/18 per oncologist recommendations due to colitis dx.  She has chronic dyspnea at baseline, but reports this has worsened along with worsening bilateral lower extremity edema she suspected this is due to chemotherapy treatment.    ED Course  Upon arrival to the ER during this presentation pt reported worsening abdominal pain, nausea, and poor po intake despite compliance with Augmentin .  Significant lab results: K+ 3.3/calcium  8.6/total bilirubin 1.4/hgb 9.5/platelet count 126.  CXR negative for acute cardiopulmonary disease.  Pt received ceftriaxone /metronidazole /4 mg of iv morphine /1L NS bolus.  Pt admitted to the [facility] per hospitalist team for additional workup and treatment.   09/19: Admitted to the medsurg unit with acute GI bleed and proctocolitis  09/20: GI consulted and scheduled EGD and colonoscopy with possible biopsy on 09/22 09/22: Pt ingested bowel prep overnight and developed acute hypoxic respiratory failure secondary to aspiration following multiple episodes of emesis requiring transfer to ICU and mechanical intubation  09/23: Patient extubated to HFNC  and alternating with Bipap.   9/25.  Medical team took over case.  Patient on 100% heated high flow nasal cannula, 30 L flow.  Apparently later in the afternoon there was a kink in the heated high flow nasal cannula which just made it appear like she was on 100% oxygen.  Tapered oxygen over the day. 9/26.  Patient complains of coughing up a little blood.  Down to 2 L nasal cannula this morning and over to room air this afternoon.  Assessment and Plan: * Acute respiratory failure with hypoxia (HCC) Patient required intubation and mechanical ventilation.  Patient was extubated on 9/23.  Patient off oxygen.  Aspiration pneumonia (HCC) On Zosyn .  Switched over to Augmentin  last night to complete course.  COPD with acute exacerbation (HCC) Completed Solu-Medrol  and on Breztri  inhaler  Acute on chronic blood loss anemia Current hemoglobin 9.0. She has history of myelodysplastic syndrome.  Proctocolitis Stool studies are negative  Essential hypertension On Norvasc   MDS (myelodysplastic syndrome) (HCC) Follow-up with oncology as outpatient  Hypothyroidism, unspecified On levothyroxine   Anxiety and depression On Celexa   Hemoptysis Likely from pneumonia.  Could also be from the high flow nasal cannula with irritation for forcing air in.  Now on room air.  No further coughing up blood  Hypokalemia Replaced  AKI (acute kidney injury) Creatinine 0.65 at its lowest point.  Creatinine peaked at 1.6.  Last creatinine 0.98.        Subjective: Patient feeling well.  No further coughing up blood.  No bleeding.  Admitted 8 days ago with abdominal pain and colitis.   Physical Exam: Vitals:   01/19/24 1927 01/19/24 2034 01/20/24  0450 01/20/24 0732  BP: 111/61  (!) 118/52 (!) 134/58  Pulse: 69  74 75  Resp: 18  15 19   Temp: 98.2 F (36.8 C)  98.2 F (36.8 C) 98.2 F (36.8 C)  TempSrc: Oral     SpO2: 99% 96% 93% 94%  Weight:      Height:       Physical Exam HENT:     Head:  Normocephalic.  Eyes:     General: Lids are normal.     Conjunctiva/sclera: Conjunctivae normal.  Cardiovascular:     Rate and Rhythm: Normal rate and regular rhythm.     Heart sounds: Normal heart sounds, S1 normal and S2 normal.  Pulmonary:     Breath sounds: Examination of the right-lower field reveals decreased breath sounds and rhonchi. Examination of the left-lower field reveals decreased breath sounds and rhonchi. Decreased breath sounds and rhonchi present. No wheezing or rales.  Abdominal:     Palpations: Abdomen is soft.     Tenderness: There is no abdominal tenderness.  Musculoskeletal:     Right lower leg: No swelling.     Left lower leg: No swelling.  Skin:    General: Skin is warm.     Findings: No rash.  Neurological:     Mental Status: She is alert and oriented to person, place, and time.     Data Reviewed: Creatinine 0.98, hemoglobin 9  Family Communication: Spoke with Lenore  Disposition: Status is: Inpatient Remains inpatient appropriate because: Stable to go to inpatient rehab once insurance company approves  Planned Discharge Destination: Acute inpatient rehab    Time spent: 27 minutes  Author: Charlie Patterson, MD 01/20/2024 3:39 PM  For on call review [email or web address].

## 2024-01-20 NOTE — Plan of Care (Signed)

## 2024-01-20 NOTE — Plan of Care (Signed)

## 2024-01-20 NOTE — Progress Notes (Addendum)
 Mobility Specialist Progress Note:    01/20/24 1611  Mobility  Activity Ambulated with assistance  Level of Assistance Minimal assist, patient does 75% or more  Assistive Device Front wheel walker;None  Distance Ambulated (ft) 100 ft  Range of Motion/Exercises Active;All extremities  Activity Response Tolerated well  Mobility visit 1 Mobility  Mobility Specialist Start Time (ACUTE ONLY) 1548  Mobility Specialist Stop Time (ACUTE ONLY) 1611  Mobility Specialist Time Calculation (min) (ACUTE ONLY) 23 min   Pt received in bed, visitor in room. Eager for mobility, required CGA to stand and ambulate with RW. Pt can ambulate with no AD but requires more assistance for LOB. Audible SOB, SpO2 90% on RA throughout. Returned to room, left supine. Alarm on, all needs met.  Sherrilee Ditty Mobility Specialist Please contact via Special educational needs teacher or  Rehab office at [phone]

## 2024-01-21 DIAGNOSIS — J9601 Acute respiratory failure with hypoxia: Secondary | ICD-10-CM | POA: Diagnosis not present

## 2024-01-21 DIAGNOSIS — K529 Noninfective gastroenteritis and colitis, unspecified: Secondary | ICD-10-CM | POA: Diagnosis not present

## 2024-01-21 DIAGNOSIS — D62 Acute posthemorrhagic anemia: Secondary | ICD-10-CM | POA: Diagnosis not present

## 2024-01-21 DIAGNOSIS — J441 Chronic obstructive pulmonary disease with (acute) exacerbation: Secondary | ICD-10-CM | POA: Diagnosis not present

## 2024-01-21 NOTE — Progress Notes (Signed)
 Mobility Specialist Progress Note:    01/21/24 1653  Mobility  Activity Ambulated with assistance  Level of Assistance Contact guard assist, steadying assist  Assistive Device Front wheel walker  Distance Ambulated (ft) 120 ft  Range of Motion/Exercises Active;All extremities  Activity Response Tolerated well  Mobility visit 1 Mobility  Mobility Specialist Start Time (ACUTE ONLY) 1632  Mobility Specialist Stop Time (ACUTE ONLY) 1643  Mobility Specialist Time Calculation (min) (ACUTE ONLY) 11 min   Pt received in bed, agreeable to mobility. Required CGA to stand and ambulate with RW. Tolerated well, pt feels weaker today. SpO2 91% on RA after ambulation. Returned supine, alarm on and belongings in reach. All needs met.  Sherrilee Ditty Mobility Specialist Please contact via Special educational needs teacher or  Rehab office at [phone]

## 2024-01-21 NOTE — Plan of Care (Signed)

## 2024-01-21 NOTE — Progress Notes (Signed)
 Progress Note   Patient: Sarah [name] FMW:[id number] DOB: February 04, 1951 DOA: 01/12/2024     9 DOS: the patient was seen and examined on 01/21/2024   Brief hospital course: 73 yo female who presented to [facility] ER on 09/19 with c/o LLQ abdominal pain with black stool onset several days prior to presentation.  Per ED notes on 09/16 pt was sent over from the cancer center to the ER with abdominal pain and dark tarry stools.  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  She received metronidazole  and ceftriaxone .  EDP recommended hospital admission, however pt refused.  She was discharged from the ER and prescribed Augmentin  and instructed to follow-up with her PCP.     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  However, treatment put on hold since 09/18 per oncologist recommendations due to colitis dx.  She has chronic dyspnea at baseline, but reports this has worsened along with worsening bilateral lower extremity edema she suspected this is due to chemotherapy treatment.    ED Course  Upon arrival to the ER during this presentation pt reported worsening abdominal pain, nausea, and poor po intake despite compliance with Augmentin .  Significant lab results: K+ 3.3/calcium  8.6/total bilirubin 1.4/hgb 9.5/platelet count 126.  CXR negative for acute cardiopulmonary disease.  Pt received ceftriaxone /metronidazole /4 mg of iv morphine /1L NS bolus.  Pt admitted to the [facility] per hospitalist team for additional workup and treatment.   09/19: Admitted to the medsurg unit with acute GI bleed and proctocolitis  09/20: GI consulted and scheduled EGD and colonoscopy with possible biopsy on 09/22 09/22: Pt ingested bowel prep overnight and developed acute hypoxic respiratory failure secondary to aspiration following multiple episodes of emesis requiring transfer to ICU and mechanical intubation  09/23: Patient extubated to HFNC  and alternating with Bipap.   9/25.  Medical team took over case.  Patient on 100% heated high flow nasal cannula, 30 L flow.  Apparently later in the afternoon there was a kink in the heated high flow nasal cannula which just made it appear like she was on 100% oxygen.  Tapered oxygen over the day. 9/26.  Patient complains of coughing up a little blood.  Down to 2 L nasal cannula this morning and over to room air this afternoon. 9/27 and 9/28.  Patient still off oxygen.  Waiting to hear back from insurance about acute inpatient rehab.  Gastroenterology will sign up for an EGD tomorrow.  Patient declined colonoscopy.  Assessment and Plan: * Acute respiratory failure with hypoxia (HCC) Patient required intubation and mechanical ventilation.  Patient was extubated on 9/23.  Patient off oxygen last 2 days.  Last pulse ox 86% on room air will have nursing staff check this again.  Aspiration pneumonia (HCC) On Augmentin  to complete 5-day course.  COPD with acute exacerbation (HCC) Completed Solu-Medrol  and on Breztri  inhaler  Acute on chronic blood loss anemia Last hemoglobin 9.0. She has history of myelodysplastic syndrome.  Patient on Protonix .  Gastroenterology plans on doing an EGD tomorrow.  Patient declined colonoscopy.  Proctocolitis Stool studies are negative  Essential hypertension On Norvasc   MDS (myelodysplastic syndrome) (HCC) Follow-up with oncology as outpatient  Hypothyroidism, unspecified On levothyroxine   Anxiety and depression On Celexa   Hemoptysis Likely from pneumonia.  Could also be from the high flow nasal cannula with irritation for forcing air in.  Now on room air.  No further coughing up blood  Hypokalemia Replaced  AKI (  acute kidney injury) Creatinine 0.65 at its lowest point.  Creatinine peaked at 1.6.  Last creatinine 0.98.        Subjective: Patient feels okay.  Feeling stronger.  Does not think she wants to do a colonoscopy.  Admitted with  colitis.  Physical Exam: Vitals:   01/20/24 1541 01/20/24 1933 01/21/24 0443 01/21/24 0835  BP: (!) 121/92 (!) 147/59 (!) 142/63 (!) 138/56  Pulse: 70 75 71 81  Resp: 16 16 16 17   Temp: 98.6 F (37 C) 98.2 F (36.8 C) 98 F (36.7 C) 98.3 F (36.8 C)  TempSrc: Oral     SpO2: 90% 96% 92% (!) 86%  Weight:      Height:       Physical Exam HENT:     Head: Normocephalic.  Eyes:     General: Lids are normal.     Conjunctiva/sclera: Conjunctivae normal.  Cardiovascular:     Rate and Rhythm: Normal rate and regular rhythm.     Heart sounds: Normal heart sounds, S1 normal and S2 normal.  Pulmonary:     Breath sounds: Examination of the right-lower field reveals decreased breath sounds and rhonchi. Examination of the left-lower field reveals decreased breath sounds and rhonchi. Decreased breath sounds and rhonchi present. No wheezing or rales.  Abdominal:     Palpations: Abdomen is soft.     Tenderness: There is no abdominal tenderness.  Musculoskeletal:     Right lower leg: No swelling.     Left lower leg: No swelling.  Skin:    General: Skin is warm.     Findings: No rash.  Neurological:     Mental Status: She is alert and oriented to person, place, and time.     Data Reviewed: Last creatinine 0.98, last hemoglobin 9.0  Family Communication: Spoke with Sarah [name]  Disposition: Status is: Inpatient Remains inpatient appropriate because: GI wants to do EGD tomorrow, patient declined colonoscopy.  Waiting to hear back from insurance company on acute inpatient rehab  Planned Discharge Destination: CIR    Time spent: 28 minutes  Author: Charlie Patterson, MD 01/21/2024 1:53 PM  For on call review [email or web address].

## 2024-01-21 NOTE — Progress Notes (Signed)
 Mobility Specialist Progress Note:    01/21/24 0916  Mobility  Activity Ambulated with assistance;Pivoted/transferred from bed to chair  Level of Assistance Contact guard assist, steadying assist  Assistive Device Front wheel walker  Distance Ambulated (ft) 30 ft  Range of Motion/Exercises Active;All extremities  Activity Response Tolerated well  Mobility visit 1 Mobility  Mobility Specialist Start Time (ACUTE ONLY) 0901  Mobility Specialist Stop Time (ACUTE ONLY) 0915  Mobility Specialist Time Calculation (min) (ACUTE ONLY) 14 min   Pt received in bed, agreeable to mobility. Required CGA to stand and ambulate with RW. Tolerated well, asx throughout. Left in chair waiting for breakfast. Alarm on and belongings in reach, all needs met.  Sherrilee Ditty Mobility Specialist Please contact via Special educational needs teacher or  Rehab office at [phone]

## 2024-01-21 NOTE — Progress Notes (Signed)
 [facility] Gastroenterology Inpatient Progress Note    Subjective: Patient seen for follow up of melena and abnormal CT showing procotsigmoid colitis. Patient denies diarrhea, abdominal pain. She is having some continued difficulty with anxiety and has been vomiting some of her clear liquid diet up today. She denies further melena and denies hematochezia. She has experienced some hemoptysis while coughing related to pneumonia. She is on Room Air and denies further significant dyspnea.  Objective: Vital signs in last 24 hours: Temp:  [98 F (36.7 C)-98.6 F (37 C)] 98.3 F (36.8 C) (09/28 0835) Pulse Rate:  [70-81] 81 (09/28 0835) Resp:  [16-17] 17 (09/28 0835) BP: (121-147)/(56-92) 138/56 (09/28 0835) SpO2:  [86 %-96 %] 86 % (09/28 0835) FiO2 (%):  [21 %] 21 % (09/27 2300) Blood pressure (!) 138/56, pulse 81, temperature 98.3 F (36.8 C), resp. rate 17, height 5' 3 (1.6 m), weight 81.7 kg, SpO2 (!) 86%.    Intake/Output from previous day: 09/27 0701 - 09/28 0700 In: 240 [P.O.:240] Out: -   Intake/Output this shift: Total I/O In: 900 [P.O.:900] Out: -    Gen: NAD. Appears comfortable.  HEENT: [place]/AT. PERRLA. Normal external ear exam.  Chest: CTA, no wheezes. Few rhonchi noted bilaterally, however.  CV: RR nl S1, S2. No gallops.  Abd: soft, nt, nd. BS+  Ext: no edema. Pulses 2+  Neuro: Alert and oriented. Judgement appears normal. Nonfocal.   Lab Results: No results found for this or any previous visit (from the past 24 hours).   Recent Labs    01/19/24 0355 01/20/24 1002  WBC 5.7  --   HGB 7.9* 9.0*  HCT 24.6*  --   PLT 167  --    BMET Recent Labs    01/19/24 0355 01/20/24 0340  NA 142  --   K 3.5  --   CL 100  --   CO2 32  --   GLUCOSE 155*  --   BUN 23  --   CREATININE 1.10* 0.98  CALCIUM  8.2*  --    LFT Recent Labs    01/19/24 0355  ALBUMIN 2.6*   PT/INR No results for input(s): LABPROT, INR in the last 72 hours. Hepatitis  Panel No results for input(s): HEPBSAG, HCVAB, HEPAIGM, HEPBIGM in the last 72 hours. C-Diff No results for input(s): CDIFFTOX in the last 72 hours. No results for input(s): CDIFFPCR in the last 72 hours.   Studies/Results: No results found.  Scheduled Inpatient Medications:    amLODipine   5 mg Oral Daily   amoxicillin -clavulanate  1 tablet Oral Q12H   budesonide -glycopyrrolate -formoterol   2 puff Inhalation BID   citalopram   20 mg Oral Daily   feeding supplement  237 mL Oral BID BM   heparin  injection (subcutaneous)  5,000 Units Subcutaneous Q8H   ipratropium-albuterol   3 mL Nebulization BID   levothyroxine   50 mcg Oral Q0600   loratadine   10 mg Oral Daily   multivitamin with minerals  1 tablet Oral Daily   pantoprazole  (PROTONIX ) IV  40 mg Intravenous Q12H    Continuous Inpatient Infusions:    PRN Inpatient Medications:  acetaminophen  **OR** acetaminophen , guaiFENesin -dextromethorphan , metoCLOPramide  (REGLAN ) injection, ondansetron  (ZOFRAN ) IV, mouth rinse, polyethylene glycol powder, traZODone   Miscellaneous: N/A  Assessment:  Principal Problem:   Acute respiratory failure with hypoxia (HCC) Active Problems:   Anxiety and depression   Hypothyroidism, unspecified   Essential hypertension   MDS (myelodysplastic syndrome) (HCC)   Proctocolitis   Aspiration pneumonia (HCC)   COPD with  acute exacerbation (HCC)   Acute on chronic blood loss anemia   AKI (acute kidney injury)   Hypokalemia   Hematochezia   Hemoptysis 73 year old female with history of myelodysplastic syndrome presents for melena.  She suffered aspiration pneumonia while trying to drink a bowel preparation on 01/13/2024 and required endotracheal intubation and ventilator management while being treated for pneumonia.  She has been extubated for several days now and now does not require supplemental oxygen. GI has been tasked with the issues of melena and abnormal CT scan suggesting  proctosigmoiditis. Although patient is agreeable to upper endoscopy, she refuses to drink any more bowel preparation given her previous history of aspiration.  Plan:  1.  Continue serial hematocrit determinations. 2.  Continue IV acid suppression. 3.  EGD will be planned for tomorrow with Dr. Jinny. The patient understands the nature of the planned procedure, indications, risks, alternatives and potential complications including but not limited to bleeding, infection, perforation, damage to internal organs and possible oversedation/side effects from anesthesia. The patient agrees and gives consent to proceed.  Please refer to procedure notes for findings, recommendations and patient disposition/instructions. 4.  I will switch the patient back from a clear liquid diet to a solid food diet given her refusal to undergo colonoscopy. 5.  We are continuing to follow with you   Nary Sneed K. Aundria, M.D. 01/21/2024, 1:19 PM

## 2024-01-21 NOTE — Plan of Care (Signed)
   Problem: Education: Goal: Knowledge of General Education information will improve Description Including pain rating scale, medication(s)/side effects and non-pharmacologic comfort measures Outcome: Progressing   Problem: Health Behavior/Discharge Planning: Goal: Ability to manage health-related needs will improve Outcome: Progressing

## 2024-01-22 ENCOUNTER — Encounter: Payer: Self-pay | Admitting: Gastroenterology

## 2024-01-22 ENCOUNTER — Inpatient Hospital Stay: Admitting: Certified Registered Nurse Anesthetist

## 2024-01-22 ENCOUNTER — Telehealth: Payer: Self-pay | Admitting: Internal Medicine

## 2024-01-22 ENCOUNTER — Encounter
Admission: EM | Disposition: A | Discharge status: Home Health | Payer: Self-pay | Source: Ambulatory Visit | Attending: Internal Medicine

## 2024-01-22 DIAGNOSIS — K921 Melena: Secondary | ICD-10-CM | POA: Diagnosis not present

## 2024-01-22 DIAGNOSIS — D509 Iron deficiency anemia, unspecified: Secondary | ICD-10-CM | POA: Diagnosis not present

## 2024-01-22 DIAGNOSIS — K317 Polyp of stomach and duodenum: Secondary | ICD-10-CM

## 2024-01-22 HISTORY — PX: ESOPHAGOGASTRODUODENOSCOPY: SHX5428

## 2024-01-22 LAB — HEMOGLOBIN: Hemoglobin: 8.5 g/dL — ABNORMAL LOW (ref 12.0–15.0)

## 2024-01-22 LAB — BASIC METABOLIC PANEL WITH GFR
Anion gap: 6 (ref 5–15)
BUN: 15 mg/dL (ref 8–23)
CO2: 27 mmol/L (ref 22–32)
Calcium: 8.4 mg/dL — ABNORMAL LOW (ref 8.9–10.3)
Chloride: 107 mmol/L (ref 98–111)
Creatinine, Ser: 0.76 mg/dL (ref 0.44–1.00)
GFR, Estimated: >60 mL/min (ref >60)
Glucose, Bld: 111 mg/dL — ABNORMAL HIGH (ref 70–99)
Potassium: 3.8 mmol/L (ref 3.5–5.1)
Sodium: 140 mmol/L (ref 135–145)

## 2024-01-22 SURGERY — EGD (ESOPHAGOGASTRODUODENOSCOPY)
Anesthesia: General

## 2024-01-22 MED ORDER — PROPOFOL 10 MG/ML IV BOLUS
INTRAVENOUS | Status: DC | PRN
  Administered 2024-01-22: 60 mg via INTRAVENOUS

## 2024-01-22 MED ORDER — LIDOCAINE HCL (CARDIAC) PF 100 MG/5ML IV SOSY
PREFILLED_SYRINGE | INTRAVENOUS | Status: DC | PRN
  Administered 2024-01-22: 80 mg via INTRAVENOUS

## 2024-01-22 MED ORDER — FERROUS SULFATE 324 MG PO TBEC: 324.0000 mg | DELAYED_RELEASE_TABLET | Freq: Every day | ORAL | Status: AC

## 2024-01-22 MED ORDER — PROPOFOL 500 MG/50ML IV EMUL
INTRAVENOUS | Status: DC | PRN
  Administered 2024-01-22: 150 ug/kg/min via INTRAVENOUS

## 2024-01-22 MED ORDER — AMLODIPINE BESYLATE 5 MG PO TABS: 2.5000 mg | ORAL_TABLET | Freq: Every day | ORAL | Status: DC

## 2024-01-22 MED ORDER — PANTOPRAZOLE SODIUM 40 MG PO TBEC: 40.0000 mg | DELAYED_RELEASE_TABLET | Freq: Every day | ORAL | 0 refills | Status: DC

## 2024-01-22 MED ORDER — SODIUM CHLORIDE 0.9 % IV SOLN: INTRAVENOUS | Status: DC

## 2024-01-22 NOTE — TOC Progression Note (Addendum)
 Transition of Care [facility]) - Progression Note    Patient Details  Name: Sarah [name] MRN: [id number] Date of Birth: [birth year]  [facility]) CM/SW Contact  Alvaro Louder, [place] Phone Number: 01/22/2024, 10:18 AM  Clinical Narrative:   [name] Pending for patient to admit to CIR. No TOC needs at this moment.  12:03 PM: Auth denied, will provide Patient With [facility] options at the bedside.   TOC to follow for discharge.      Barriers to Discharge: Continued Medical Work up               Expected Discharge Plan and Services         Expected Discharge Date: 01/20/24                                     Social Drivers of Health (SDOH) Interventions SDOH Screenings   Food Insecurity: No Food Insecurity (01/12/2024)  Housing: Low Risk  (01/12/2024)  Transportation Needs: No Transportation Needs (01/12/2024)  Utilities: Not At Risk (01/12/2024)  Alcohol Screen: Low Risk  (05/15/2023)  Depression (PHQ2-9): Low Risk  (01/12/2024)  Recent Concern: Depression (PHQ2-9) - Medium Risk (11/06/2023)  Financial Resource Strain: Low Risk  (05/15/2023)  Physical Activity: Inactive (05/15/2023)  Social Connections: Moderately Isolated (01/12/2024)  Stress: No Stress Concern Present (05/15/2023)  Tobacco Use: Low Risk  (01/12/2024)  Health Literacy: Adequate Health Literacy (09/12/2023)    Readmission Risk Interventions    01/18/2024    1:19 PM  Readmission Risk Prevention Plan  Transportation Screening Complete  Medication Review (RN Care Manager) Complete  PCP or Specialist appointment within 3-5 days of discharge Complete  SW Recovery Care/Counseling Consult Complete  Palliative Care Screening Not Applicable  Skilled Nursing Facility Not Applicable

## 2024-01-22 NOTE — Progress Notes (Signed)
 Mobility Specialist Progress Note:    01/22/24 0816  Mobility  Activity Ambulated with assistance  Level of Assistance Contact guard assist, steadying assist  Assistive Device Front wheel walker  Distance Ambulated (ft) 130 ft  Range of Motion/Exercises Active;All extremities  Activity Response Tolerated well  Mobility visit 1 Mobility  Mobility Specialist Start Time (ACUTE ONLY) 0756  Mobility Specialist Stop Time (ACUTE ONLY) [id number]  Mobility Specialist Time Calculation (min) (ACUTE ONLY) 16 min   Pt received in bed, eager for mobility. Required CGA to stand and SBA to ambulate with RW. Tolerated well, see O2 sats below. Returned supine, alarm on and belongings in reach. Nurse notified, all needs met.  SpO2 90% on RA at rest SpO2 85% on RA during ambulation, recovers well with rest break; no audible SOB SpO2 94% on RA after ambulation  Sherrilee Ditty Mobility Specialist Please contact via SecureChat or  Rehab office at [phone]

## 2024-01-22 NOTE — Progress Notes (Signed)
 Inpatient Rehab Admissions Coordinator:   Continue to await determination from [facility] Medicare.  Dr. Babs attempted to complete P2P on Friday, but no answer on P2P line.  He left a voicemail which was not returned.    Reche Lowers, PT, DPT Admissions Coordinator [phone] 01/22/24  11:15 AM

## 2024-01-22 NOTE — Plan of Care (Signed)

## 2024-01-22 NOTE — Progress Notes (Signed)
 Physical Therapy Evaluation Patient Details Name: Sarah [name] MRN: [id number] DOB: 12-05-1950 Today's Date: 01/22/2024  History of Present Illness  Pt is a 73 y/o F admitted on 01/12/24 after presenting with c/o LLQ abdominal pain with black stool. Pt with CT on 01/09/24 concerning for proctosigmoid colitis. GI was consulted & EGD & colonoscopy scheduled for 9/22. Pt ingested bowel prep over night & developed acute hypoxic respiratory failure 2/2 aspiration following multiple episodes of emesis requiring transfer to ICU & intubation; pt extubated 01/16/24. PMH: arthritis, asthma, depression, dyspnea, GERD, HLD, HTN, hypothyroidism, CML  Clinical Impression  PT focus on balance/strength this session. Pt requires min A with HHA for step over activity.  Pt presents with increased difficulty with lateral step overs and demonstrated 1 LOB requiring min A for balance correction. Pt required verbal/visual cues for technique. Would benefit from skilled PT to address above deficits and promote optimal return to PLOF.       If plan is discharge home, recommend the following: A lot of help with bathing/dressing/bathroom;Assistance with feeding;Assist for transportation;A little help with walking and/or transfers;Assistance with cooking/housework;Direct supervision/assist for medications management;Help with stairs or ramp for entrance;Direct supervision/assist for financial management   Can travel by private vehicle        Equipment Recommendations BSC/3in1;Rolling walker (2 wheels)  Recommendations for Other Services       Functional Status Assessment       Precautions / Restrictions Precautions Precautions: Fall Recall of Precautions/Restrictions: Intact Restrictions Weight Bearing Restrictions Per Provider Order: No      Mobility  Bed Mobility Overal bed mobility: Needs Assistance Bed Mobility: Supine to Sit     Supine to sit: Contact guard     General bed mobility comments: Inc  time required fro supine>sit. CGA while pt removed bed linens for sitting balance.    Transfers Overall transfer level: Needs assistance Equipment used: 1 person hand held assist Transfers: Sit to/from Stand Sit to Stand: Min assist           General transfer comment: Min A to maintain standing balance form EOB. HHA    Ambulation/Gait Ambulation/Gait assistance: Contact guard assist Gait Distance (Feet): 15 Feet Assistive device: Rolling walker (2 wheels) Gait Pattern/deviations: Step-through pattern, Decreased stride length       General Gait Details: Amb to bathroom as pt reports need for toiletting. CGA for safety.  Stairs            Wheelchair Mobility     Tilt Bed    Modified Rankin (Stroke Patients Only)       Balance Overall balance assessment: Needs assistance Sitting-balance support: Feet supported, Bilateral upper extremity supported Sitting balance-Leahy Scale: Fair Sitting balance - Comments: CGA for safety       Standing balance comment: HHA to maintain standing balance. MinA                             Pertinent Vitals/Pain Pain Assessment Pain Assessment: No/denies pain    Home Living                          Prior Function                       Extremity/Trunk Assessment                Communication   Communication Communication: No apparent difficulties  Cognition Arousal: Alert Behavior During Therapy: WFL for tasks assessed/performed   PT - Cognitive impairments: Safety/Judgement                       PT - Cognition Comments: pleasant and agreeable to PT session Following commands: Intact       Cueing Cueing Techniques: Verbal cues     General Comments General comments (skin integrity, edema, etc.): Monitor SpO2; SpO2 down to 88% at lowest with exertion.    Exercises Other Exercises Other Exercises: fwd and lateral step overs with glove box. Min A for balance  correction with HHA. Verbal/visual cuing for technique.   Assessment/Plan    PT Assessment    PT Problem List         PT Treatment Interventions      PT Goals (Current goals can be found in the Care Plan section)  Acute Rehab PT Goals Patient Stated Goal: get better PT Goal Formulation: With patient/family Time For Goal Achievement: 01/31/24 Potential to Achieve Goals: Good    Frequency Min 3X/week     Co-evaluation               AM-PAC PT 6 Clicks Mobility  Outcome Measure Help needed turning from your back to your side while in a flat bed without using bedrails?: A Little Help needed moving from lying on your back to sitting on the side of a flat bed without using bedrails?: A Little Help needed moving to and from a bed to a chair (including a wheelchair)?: A Little Help needed standing up from a chair using your arms (e.g., wheelchair or bedside chair)?: A Little Help needed to walk in hospital room?: A Little Help needed climbing 3-5 steps with a railing? : A Lot 6 Click Score: 17    End of Session Equipment Utilized During Treatment: Gait belt Activity Tolerance: Patient tolerated treatment well Patient left: Other (comment);with nursing/sitter in room (Hand off to nursing for toiletting.) Nurse Communication: Mobility status PT Visit Diagnosis: Unsteadiness on feet (R26.81);Muscle weakness (generalized) (M62.81);Difficulty in walking, not elsewhere classified (R26.2);Other abnormalities of gait and mobility (R26.89)    Time: 0950-1002 PT Time Calculation (min) (ACUTE ONLY): 12 min   Charges:                 Ceferino Lang, SPT   Antinette Keough 01/22/2024, 11:05 AM

## 2024-01-22 NOTE — Progress Notes (Signed)
 This patient underwent a EGD today for the finding of anemia and dark stools.  The patient had refused a colonoscopy after a complication of being intubated after starting the prep for the colonoscopy.  The patient should consider having the polyp in the antrum removed if she continues to be anemic.  She is not optimized from her general medical condition at this time to undergo a prolonged procedure to have that gastric polyp removed.  Nothing further to do from GI point of view and I explained the plan to the patient and her family.  I will sign off.  Please call if any further GI concerns or questions.  We would like to thank you for the opportunity to participate in the care of Sarah [name].

## 2024-01-22 NOTE — Progress Notes (Signed)
 Inpatient Rehab Admissions Coordinator:   Received a denial from [facility] Medicare regarding CIR prior auth request.  Pt notified via CSW and prefers home with [facility].  I will sign off for CIR.   Reche Lowers, PT, DPT Admissions Coordinator [phone] 01/22/24  1:53 PM

## 2024-01-22 NOTE — Transfer of Care (Signed)
 Immediate Anesthesia Transfer of Care Note  Patient: Sarah [name]  Procedure(s) Performed: EGD (ESOPHAGOGASTRODUODENOSCOPY)  Patient Location: PACU  Anesthesia Type:General  Level of Consciousness: awake, alert , and oriented  Airway & Oxygen Therapy: Patient Spontanous Breathing  Post-op Assessment: Report given to RN and Post -op Vital signs reviewed and stable  Post vital signs: Reviewed and stable  Last Vitals:  Vitals Value Taken Time  BP    Temp    Pulse    Resp    SpO2      Last Pain:  Vitals:   01/22/24 1229  TempSrc: Tympanic  PainSc: 0-No pain         Complications: No notable events documented.

## 2024-01-22 NOTE — Anesthesia Preprocedure Evaluation (Signed)
 Anesthesia Evaluation  Patient identified by MRN, date of birth, ID band Patient awake    Reviewed: Allergy & Precautions, NPO status , Patient's Chart, lab work & pertinent test results  Airway Mallampati: II  TM Distance: >3 FB Neck ROM: full    Dental  (+) Teeth Intact   Pulmonary neg pulmonary ROS, shortness of breath and with exertion, asthma , sleep apnea    Pulmonary exam normal  + decreased breath sounds      Cardiovascular Exercise Tolerance: Poor hypertension, Pt. on medications + CAD  negative cardio ROS Normal cardiovascular exam Rhythm:Regular Rate:Normal     Neuro/Psych   Anxiety     negative neurological ROS  negative psych ROS   GI/Hepatic negative GI ROS, Neg liver ROS,GERD  Medicated,,  Endo/Other  negative endocrine ROSHypothyroidism  Class 3 obesity  Renal/GU negative Renal ROS  negative genitourinary   Musculoskeletal  (+) Arthritis ,    Abdominal  (+) + obese  Peds negative pediatric ROS (+)  Hematology negative hematology ROS (+) Blood dyscrasia, anemia   Anesthesia Other Findings Past Medical History: No date: Arthritis No date: Asthma 08/11/2022: CAP (community acquired pneumonia) No date: Depression No date: Dyspnea     Comment:  with heat No date: Environmental and seasonal allergies No date: GERD (gastroesophageal reflux disease) No date: Hyperlipidemia No date: Hypertension No date: Hypothyroidism No date: Leukemia (HCC) No date: Presence of dental prosthetic device     Comment:  Dental implants No date: Wheezing  Past Surgical History: 04/26/1987: ABDOMINAL HYSTERECTOMY 11/10/2021: ANTERIOR CERVICAL DECOMPRESSION/DISCECTOMY FUSION 4  LEVELS; N/A     Comment:  Procedure: C3-7 ANTERIOR CERVICAL DISCECTOMY AND FUSION               (GLOBUS FORGE);  Surgeon: Clois Fret, MD;                Location: [facility] ORS;  Service: Neurosurgery;  Laterality:                N/A; 02/07/2020: BROW LIFT; Bilateral     Comment:  Procedure: BLEPHAROPLASTY UPPER EYELID; W/EXCESS SKIN               BROW PTOSIS REPAIR BILATERAL;  Surgeon: Ashley Greig HERO,               MD;  Location: [facility];  Service:               Ophthalmology;  Laterality: Bilateral; 09/13/2016: CATARACT EXTRACTION W/PHACO; Right     Comment:  Procedure: CATARACT EXTRACTION PHACO AND INTRAOCULAR               LENS PLACEMENT (IOC);  Surgeon: Jaye Fallow, MD;                Location: [facility] ORS;  Service: Ophthalmology;  Laterality:              Right;  US  00:38 AP% 14.3 CDE 5.51 Fluid pack lot #               [id number] 10/11/2016: CATARACT EXTRACTION W/PHACO; Left     Comment:  Procedure: CATARACT EXTRACTION PHACO AND INTRAOCULAR               LENS PLACEMENT (IOC);  Surgeon: Jaye Fallow, MD;                Location: [facility] ORS;  Service: Ophthalmology;  Laterality:  Left;  US  00:30 AP% 11.3 CDE 3.50 fluid pack lot #               [id number] 09/30/2014: COLONOSCOPY WITH PROPOFOL ; N/A     Comment:  Procedure: COLONOSCOPY WITH PROPOFOL ;  Surgeon: Reyes LELON Cota, MD;  Location: [facility] ENDOSCOPY;  Service:               Endoscopy;  Laterality: N/A; 03/24/2021: COLONOSCOPY WITH PROPOFOL ; N/A     Comment:  Procedure: COLONOSCOPY WITH PROPOFOL ;  Surgeon: Unk Corinn Skiff, MD;  Location: [facility] ENDOSCOPY;  Service:               Gastroenterology;  Laterality: N/A; 09/30/2014: ESOPHAGOGASTRODUODENOSCOPY; N/A     Comment:  Procedure: ESOPHAGOGASTRODUODENOSCOPY (EGD);  Surgeon:               Reyes LELON Cota, MD;  Location: [facility] ENDOSCOPY;                Service: Endoscopy;  Laterality: N/A; 03/24/2021: ESOPHAGOGASTRODUODENOSCOPY; N/A     Comment:  Procedure: ESOPHAGOGASTRODUODENOSCOPY (EGD);  Surgeon:               Unk Corinn Skiff, MD;  Location: [facility] ENDOSCOPY;                Service: Gastroenterology;  Laterality: N/A; 04/26/1983:  FOOT SURGERY; Bilateral 04/25/2014: KNEE SURGERY  BMI    Body Mass Index: 31.91 kg/m      Reproductive/Obstetrics negative OB ROS                              Anesthesia Physical Anesthesia Plan  ASA: 3  Anesthesia Plan: General   Post-op Pain Management:    Induction: Intravenous  PONV Risk Score and Plan: Propofol  infusion and TIVA  Airway Management Planned: Natural Airway and Nasal Cannula  Additional Equipment:   Intra-op Plan:   Post-operative Plan:   Informed Consent: I have reviewed the patients History and Physical, chart, labs and discussed the procedure including the risks, benefits and alternatives for the proposed anesthesia with the patient or authorized representative who has indicated his/her understanding and acceptance.     Dental Advisory Given  Plan Discussed with: CRNA  Anesthesia Plan Comments:         Anesthesia Quick Evaluation

## 2024-01-22 NOTE — Op Note (Signed)
 [facility] Gastroenterology Patient Name: Sarah [name] Procedure Date: 01/22/2024 12:31 PM MRN: [id number] Account #: [id number] Date of Birth: 08-13-1950 Admit Type: Inpatient Age: 73 Room: [facility] ENDO ROOM 3 Gender: Female Note Status: Finalized Instrument Name: Upper GI Scope [id number] Procedure:             Upper GI endoscopy Indications:           Iron  deficiency anemia Providers:             Rogelia Copping MD, MD Referring MD:          Verneita Kettering, MD (Referring MD) Medicines:             Propofol  per Anesthesia Complications:         No immediate complications. Procedure:             Pre-Anesthesia Assessment:                        - Prior to the procedure, a History and Physical was                         performed, and patient medications and allergies were                         reviewed. The patient's tolerance of previous                         anesthesia was also reviewed. The risks and benefits                         of the procedure and the sedation options and risks                         were discussed with the patient. All questions were                         answered, and informed consent was obtained. Prior                         Anticoagulants: The patient has taken no anticoagulant                         or antiplatelet agents. ASA Grade Assessment: II - A                         patient with mild systemic disease. After reviewing                         the risks and benefits, the patient was deemed in                         satisfactory condition to undergo the procedure.                        After obtaining informed consent, the endoscope was                         passed under direct vision. Throughout the procedure,  the patient's blood pressure, pulse, and oxygen                         saturations were monitored continuously. The Endoscope                         was introduced through the mouth, and  advanced to the                         second part of duodenum. The upper GI endoscopy was                         accomplished without difficulty. The patient tolerated                         the procedure well. Findings:      The examined esophagus was normal.      Multiple pedunculated and sessile polyps with no bleeding and no       stigmata of recent bleeding were found in the entire examined stomach.      The examined duodenum was normal. Impression:            - Normal esophagus.                        - Multiple gastric polyps.                        - Normal examined duodenum.                        - No specimens collected. Recommendation:        - Return patient to hospital ward for ongoing care.                        - Resume previous diet.                        - Continue present medications.                        - The patients Hb has been stable. I would consider                         biosy and possible removal in the future when the                         patient is more stable. Procedure Code(s):     --- Professional ---                        [phone], Esophagogastroduodenoscopy, flexible,                         transoral; diagnostic, including collection of                         specimen(s) by brushing or washing, when performed                         (separate procedure) Diagnosis Code(s):     ---  Professional ---                        D50.9, Iron  deficiency anemia, unspecified                        K31.7, Polyp of stomach and duodenum CPT copyright 2022 American Medical Association. All rights reserved. The codes documented in this report are preliminary and upon coder review may  be revised to meet current compliance requirements. Rogelia Copping MD, MD 01/22/2024 12:44:46 PM This report has been signed electronically. Number of Addenda: 0 Note Initiated On: 01/22/2024 12:31 PM Estimated Blood Loss:  Estimated blood loss: none.      [facility]

## 2024-01-22 NOTE — Progress Notes (Signed)
 OT Cancellation Note  Patient Details Name: Sarah [name] MRN: [id number] DOB: December 21, 1950   Cancelled Treatment:    Reason Eval/Treat Not Completed: Patient at procedure or test/ unavailable. OT continues to follow pt for therapy services. Pt currently OTF for procedure. Will hold at this time and re-attempt at a later time/date as available and pt medically appropriate.   Jhonny Pelton, M.S., OTR/L 01/22/24, 12:32 PM

## 2024-01-22 NOTE — TOC Transition Note (Signed)
 Transition of Care [facility]) - Discharge Note   Patient Details  Name: MAILEE KLAAS MRN: [id number] Date of Birth: 1950/12/31  Transition of Care [facility]) CM/SW Contact:  Alvaro Louder, LCSW Phone Number: 01/22/2024, 3:40 PM   Clinical Narrative:  LCSWA met with the patient at bedside to discuss options since CIR auth was denied. LCSWA spoke to patient about [facility] and patient was agreeable. LCSWA set patient up with [facility] PT and OT from [facility]. Patient indicated that her friend Gerhardt will take her home at discharge.   TOC Signing off   Final next level of care: Home w Home Health Services Barriers to Discharge: No Barriers Identified   Patient Goals and CMS Choice            Discharge Placement              Patient chooses bed at:  (Home) Patient to be transferred to facility by: Lenore Name of family member notified: Self Patient and family notified of of transfer: 01/22/24  Discharge Plan and Services Additional resources added to the After Visit Summary for                            [facility] Arranged: PT, OT HH Agency: [facility] Date [facility] Agency Contacted: 01/22/24   Representative spoke with at [facility] Agency: [place]   Social Drivers of Health (SDOH) Interventions SDOH Screenings   Food Insecurity: No Food Insecurity (01/12/2024)  Housing: Low Risk  (01/12/2024)  Transportation Needs: No Transportation Needs (01/12/2024)  Utilities: Not At Risk (01/12/2024)  Alcohol Screen: Low Risk  (05/15/2023)  Depression (PHQ2-9): Low Risk  (01/12/2024)  Recent Concern: Depression (PHQ2-9) - Medium Risk (11/06/2023)  Financial Resource Strain: Low Risk  (05/15/2023)  Physical Activity: Inactive (05/15/2023)  Social Connections: Moderately Isolated (01/12/2024)  Stress: No Stress Concern Present (05/15/2023)  Tobacco Use: Low Risk  (01/12/2024)  Health Literacy: Adequate Health Literacy (09/12/2023)     Readmission Risk Interventions    01/18/2024    1:19 PM   Readmission Risk Prevention Plan  Transportation Screening Complete  Medication Review (RN Care Manager) Complete  PCP or Specialist appointment within 3-5 days of discharge Complete  SW Recovery Care/Counseling Consult Complete  Palliative Care Screening Not Applicable  Skilled Nursing Facility Not Applicable

## 2024-01-22 NOTE — Plan of Care (Signed)

## 2024-01-22 NOTE — Telephone Encounter (Signed)
 Patient has been discharged from the hospital and called requesting a follow up appointment. I scheduled her for 10/7 with Lauren lab/md. Please let me know if that needs to be changed.   Thank you

## 2024-01-22 NOTE — Anesthesia Postprocedure Evaluation (Signed)
 Anesthesia Post Note  Patient: Sarah [name]  Procedure(s) Performed: EGD (ESOPHAGOGASTRODUODENOSCOPY)  Patient location during evaluation: PACU Anesthesia Type: General Level of consciousness: awake and alert Pain management: satisfactory to patient Vital Signs Assessment: post-procedure vital signs reviewed and stable Respiratory status: nonlabored ventilation Cardiovascular status: blood pressure returned to baseline Anesthetic complications: no   No notable events documented.   Last Vitals:  Vitals:   01/22/24 1258 01/22/24 1308  BP: (!) 126/54 (!) 115/54  Pulse: 75 72  Resp: 20 17  Temp:    SpO2: 93% 95%    Last Pain:  Vitals:   01/22/24 1308  TempSrc:   PainSc: 0-No pain                 VAN STAVEREN,Artie Takayama

## 2024-01-22 NOTE — Discharge Summary (Signed)
 Physician Discharge Summary   Patient: Sarah [name] MRN: [id number] DOB: 1950-11-09  Admit date:     01/12/2024  Discharge date: 01/22/24  Discharge Physician: Charlie Patterson   PCP: [name], MD   Recommendations at discharge:   Follow-up PCP 5 days Follow-up hematology Dr. Rennie 1 week Follow-up with gastroenterology if you want gastric polyps removed or a colonoscopy  Discharge Diagnoses: Principal Problem:   Acute respiratory failure with hypoxia (HCC) Active Problems:   Aspiration pneumonia (HCC)   COPD with acute exacerbation (HCC)   Acute on chronic blood loss anemia   Proctocolitis   Essential hypertension   MDS (myelodysplastic syndrome) (HCC)   Hypothyroidism, unspecified   Anxiety and depression   AKI (acute kidney injury)   Hypokalemia   Hematochezia   Hemoptysis   Melena   Hospital Course: 73 yo female who presented to [facility] ER on 09/19 with c/o LLQ abdominal pain with black stool onset several days prior to presentation.  Per ED notes on 09/16 pt was sent over from the cancer center to the ER with abdominal pain and dark tarry stools.  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  She received metronidazole  and ceftriaxone .  EDP recommended hospital admission, however pt refused.  She was discharged from the ER and prescribed Augmentin  and instructed to follow-up with her PCP.     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  However, treatment put on hold since 09/18 per oncologist recommendations due to colitis dx.  She has chronic dyspnea at baseline, but reports this has worsened along with worsening bilateral lower extremity edema she suspected this is due to chemotherapy treatment.    ED Course  Upon arrival to the ER during this presentation pt reported worsening abdominal pain, nausea, and poor po intake despite compliance with Augmentin .  Significant  lab results: K+ 3.3/calcium  8.6/total bilirubin 1.4/hgb 9.5/platelet count 126.  CXR negative for acute cardiopulmonary disease.  Pt received ceftriaxone /metronidazole /4 mg of iv morphine /1L NS bolus.  Pt admitted to the [facility] per hospitalist team for additional workup and treatment.   09/19: Admitted to the medsurg unit with acute GI bleed and proctocolitis  09/20: GI consulted and scheduled EGD and colonoscopy with possible biopsy on 09/22 09/22: Pt ingested bowel prep overnight and developed acute hypoxic respiratory failure secondary to aspiration following multiple episodes of emesis requiring transfer to ICU and mechanical intubation  09/23: Patient extubated to HFNC and alternating with Bipap.   9/25.  Medical team took over case.  Patient on 100% heated high flow nasal cannula, 30 L flow.  Apparently later in the afternoon there was a kink in the heated high flow nasal cannula which just made it appear like she was on 100% oxygen.  Tapered oxygen over the day. 9/26.  Patient complains of coughing up a little blood.  Down to 2 L nasal cannula this morning and over to room air this afternoon. 9/27 and 9/28.  Patient still off oxygen.  Waiting to hear back from insurance about acute inpatient rehab.  Gastroenterology will sign up for an EGD tomorrow.  Patient declined colonoscopy. 9/29.  Hemoglobin 8.5, creatinine 7.6.  Patient feeling well.  EGD showing normal esophagus and duodenum and gastric polyps in the stomach.  No signs of bleeding.  Patient interested in going home.  Assessment and Plan: * Acute respiratory failure with hypoxia (HCC) Patient required intubation and mechanical ventilation.  Patient was extubated  on 9/23.  Patient off oxygen for the last 3 days.  Aspiration pneumonia (HCC) Completed Zosyn  then Augmentin  total of 5 days.  COPD with acute exacerbation (HCC) Completed Solu-Medrol  and on Breztri  inhaler  Acute on chronic blood loss anemia Last hemoglobin 8.5. She has  history of myelodysplastic syndrome.  Patient on Protonix .  EGD shows gastric polyps otherwise negative.  No signs of bleeding.  Patient declined colonoscopy.  Proctocolitis Stool studies are negative  Essential hypertension On Norvasc .  Will decrease dose to half dose 2.5 mg daily  MDS (myelodysplastic syndrome) (HCC) Follow-up with oncology as outpatient  Hypothyroidism, unspecified On levothyroxine   Anxiety and depression On Celexa   Hemoptysis Likely from pneumonia.  Could also be from the high flow nasal cannula with irritation for forcing air in.  Now on room air.  No further coughing up blood  Hypokalemia Replaced  AKI (acute kidney injury) Creatinine 0.65 at its lowest point.  Creatinine peaked at 1.6.  Last creatinine 0.76.   Insurance company rejected acute inpatient rehab.  Patient interested in going home with home health.  Has been walking better with mobility specialist.      Consultants: Critical care team, gastroenterology Procedures performed: Intubation, extubation, EGD Disposition: Home health Diet recommendation:  Cardiac diet DISCHARGE MEDICATION: Allergies as of 01/22/2024   No Known Allergies      Medication List     STOP taking these medications    aspirin  EC 81 MG tablet   azithromycin  250 MG tablet Commonly known as: Zithromax    famotidine  20 MG tablet Commonly known as: PEPCID    lenalidomide  10 MG capsule Commonly known as: REVLIMID    ondansetron  8 MG tablet Commonly known as: ZOFRAN    solifenacin  5 MG tablet Commonly known as: VESICARE        TAKE these medications    albuterol  108 (90 Base) MCG/ACT inhaler Commonly known as: VENTOLIN  HFA Inhale 1-2 puffs into the lungs every 6 (six) hours as needed for wheezing or shortness of breath.   amLODipine  5 MG tablet Commonly known as: NORVASC  Take 0.5 tablets (2.5 mg total) by mouth daily. What changed: how much to take   CALCIUM  CITRATE + D PO Take 1 tablet by mouth 2  (two) times daily.   citalopram  20 MG tablet Commonly known as: CELEXA  TAKE 1 TABLET BY MOUTH DAILY   cyanocobalamin  1000 MCG tablet Commonly known as: VITAMIN B12 Take 1,000 mcg by mouth daily.   ferrous sulfate  324 MG Tbec Take 1 tablet (324 mg total) by mouth daily with breakfast. 65 mg of elemental iron  What changed:  how much to take when to take this   fexofenadine 180 MG tablet Commonly known as: ALLEGRA Take 180 mg by mouth daily.   gabapentin  100 MG capsule Commonly known as: NEURONTIN  TAKE 1 CAPSULE BY MOUTH 3 TIMES  DAILY   levothyroxine  50 MCG tablet Commonly known as: SYNTHROID  TAKE 1 TABLET BY MOUTH DAILY ON  AN EMPTY STOMACH WITH A GLASS OF WATER  AT LEAST 30 TO 60 MINUTES  BEFORE BREAKFAST   pantoprazole  40 MG tablet Commonly known as: PROTONIX  Take 1 tablet (40 mg total) by mouth daily.   rosuvastatin  10 MG tablet Commonly known as: CRESTOR  TAKE ONE TABLET BY MOUTH ONCE DAILY   traZODone  50 MG tablet Commonly known as: DESYREL  TAKE 1/2 TO 1 TABLET BY MOUTH AT BEDTIME AS NEEDED FOR SLEEP   Trelegy Ellipta  200-62.5-25 MCG/ACT Aepb Generic drug: Fluticasone -Umeclidin-Vilant Inhale 1 puff into the lungs daily.  Follow-up Information     [name], MD Follow up in 1 week(s).   Specialties: Internal Medicine, Oncology Contact information: [address] [place] [address] [phone]         Aundria Ladell POUR, MD Follow up.   Specialty: Gastroenterology Why: if you want to have a colonoscopy Contact information: [address] [place] [address] [phone]         Dineen Rollene MATSU, FNP Follow up on 01/24/2024.   Specialty: Family Medicine Why: at [facility] information: [address] [place] [address] [phone]                Discharge Exam: Filed Weights   01/16/24 0500 01/17/24 0452 01/22/24 1229  Weight: 84.7 kg 81.7 kg 81.7 kg   Physical Exam HENT:      Head: Normocephalic.  Eyes:     General: Lids are normal.     Conjunctiva/sclera: Conjunctivae normal.  Cardiovascular:     Rate and Rhythm: Normal rate and regular rhythm.     Heart sounds: Normal heart sounds, S1 normal and S2 normal.  Pulmonary:     Breath sounds: Examination of the right-lower field reveals decreased breath sounds and rhonchi. Examination of the left-lower field reveals decreased breath sounds and rhonchi. Decreased breath sounds and rhonchi present. No wheezing or rales.  Abdominal:     Palpations: Abdomen is soft.     Tenderness: There is no abdominal tenderness.  Musculoskeletal:     Right lower leg: No swelling.     Left lower leg: No swelling.  Skin:    General: Skin is warm.     Findings: No rash.  Neurological:     Mental Status: She is alert and oriented to person, place, and time.      Condition at discharge: stable  The results of significant diagnostics from this hospitalization (including imaging, microbiology, ancillary and laboratory) are listed below for reference.   Imaging Studies: ECHOCARDIOGRAM LIMITED Result Date: 01/15/2024    ECHOCARDIOGRAM LIMITED REPORT   Patient Name:   CANDITA BORENSTEIN Date of Exam: 01/15/2024 Medical Rec #:  [id number]       Height:       63.0 in Accession #:    [id number]      Weight:       185.3 lb Date of Birth:  12-13-1950       BSA:          1.872 m Patient Age:    73 years        BP:           144/88 mmHg Patient Gender: F               HR:           102 bpm. Exam Location:  [facility] Procedure: Color Doppler, Cardiac Doppler, Limited Echo and Intracardiac            Opacification Agent (Both Spectral and Color Flow Doppler were            utilized during procedure). Indications:     Dyspnea R06.00  History:         Patient has prior history of Echocardiogram examinations, most                  recent 11/13/2023. Signs/Symptoms:Dyspnea.  Sonographer:     Ashley McNeely-Sloane Referring Phys:  [id number] DANA G NELSON Diagnosing  Phys: Keller Paterson  Sonographer Comments:  Technically difficult study due to poor echo windows. Image acquisition challenging due to patient body habitus. IMPRESSIONS  1. Technically difficult study, definity  used.  2. Left ventricular ejection fraction, by estimation, is 65 to 70%. The left ventricle has normal function. The left ventricle has no regional wall motion abnormalities. There is mild left ventricular hypertrophy. Left ventricular diastolic parameters are indeterminate.  3. Right ventricular systolic function was not well visualized. The right ventricular size is not well visualized.  4. Trivial mitral valve regurgitation. Moderate mitral annular calcification.  5. The aortic valve is tricuspid. There is mild thickening of the aortic valve. Aortic valve regurgitation is not visualized. FINDINGS  Left Ventricle: Left ventricular ejection fraction, by estimation, is 65 to 70%. The left ventricle has normal function. The left ventricle has no regional wall motion abnormalities. Definity  contrast agent was given IV to delineate the left ventricular  endocardial borders. The left ventricular internal cavity size was normal in size. There is mild left ventricular hypertrophy. Left ventricular diastolic parameters are indeterminate. Right Ventricle: The right ventricular size is not well visualized. Right vetricular wall thickness was not well visualized. Right ventricular systolic function was not well visualized. Left Atrium: Left atrial size was not well visualized. Right Atrium: Right atrial size was not well visualized. Mitral Valve: Moderate mitral annular calcification. Trivial mitral valve regurgitation. MV peak gradient, 12.7 mmHg. The mean mitral valve gradient is 6.0 mmHg. Tricuspid Valve: The tricuspid valve is normal in structure. Tricuspid valve regurgitation is mild. Aortic Valve: The aortic valve is tricuspid. There is mild thickening of the aortic valve. Aortic valve regurgitation is not  visualized. Aortic valve mean gradient measures 6.0 mmHg. Aortic valve peak gradient measures 11.8 mmHg. Aortic valve area, by VTI measures 2.27 cm. Pulmonic Valve: The pulmonic valve was not well visualized. Pulmonic valve regurgitation is trivial. Aorta: The aortic root is normal in size and structure. Additional Comments: Spectral Doppler performed. Color Doppler performed.  LEFT VENTRICLE PLAX 2D LVIDd:         5.40 cm     Diastology LVIDs:         2.90 cm     LV e' medial:    7.83 cm/s LV PW:         1.20 cm     LV E/e' medial:  14.8 LV IVS:        1.10 cm     LV e' lateral:   9.41 cm/s LVOT diam:     1.80 cm     LV E/e' lateral: 12.3 LV SV:         57 LV SV Index:   30 LVOT Area:     2.54 cm  LV Volumes (MOD) LV vol d, MOD A2C: 23.4 ml LV vol d, MOD A4C: 38.1 ml LV vol s, MOD A2C: 4.9 ml LV vol s, MOD A4C: 15.1 ml LV SV MOD A2C:     18.6 ml LV SV MOD A4C:     38.1 ml LV SV MOD BP:      21.3 ml RIGHT VENTRICLE RV S prime:     8.38 cm/s TAPSE (M-mode): 1.8 cm LEFT ATRIUM           Index        RIGHT ATRIUM           Index LA diam:      5.00 cm 2.67 cm/m   RA Area:     15.20 cm LA Vol (A4C): 50.8 ml 27.14  ml/m  RA Volume:   36.00 ml  19.23 ml/m  AORTIC VALVE                     PULMONIC VALVE AV Area (Vmax):    2.26 cm      PV Vmax:        1.63 m/s AV Area (Vmean):   2.24 cm      PV Vmean:       118.000 cm/s AV Area (VTI):     2.27 cm      PV VTI:         0.268 m AV Vmax:           172.00 cm/s   PV Peak grad:   10.6 mmHg AV Vmean:          116.000 cm/s  PV Mean grad:   6.0 mmHg AV VTI:            0.251 m       RVOT Peak grad: 3 mmHg AV Peak Grad:      11.8 mmHg AV Mean Grad:      6.0 mmHg LVOT Vmax:         153.00 cm/s LVOT Vmean:        102.000 cm/s LVOT VTI:          0.224 m LVOT/AV VTI ratio: 0.89  AORTA Ao Root diam: 3.10 cm Ao Asc diam:  3.00 cm MITRAL VALVE                TRICUSPID VALVE MV Area (PHT): 4.64 cm     TR Peak grad:   30.9 mmHg MV Area VTI:   1.72 cm     TR Mean grad:   22.0 mmHg MV  Peak grad:  12.7 mmHg    TR Vmax:        278.00 cm/s MV Mean grad:  6.0 mmHg     TR Vmean:       227.0 cm/s MV Vmax:       1.78 m/s MV Vmean:      116.0 cm/s   SHUNTS MV Decel Time: 164 msec     Systemic VTI:  0.22 m MV E velocity: 115.60 cm/s  Systemic Diam: 1.80 cm MV A velocity: 165.00 cm/s  Pulmonic VTI:  0.148 m MV E/A ratio:  0.70 Keller Paterson Electronically signed by Keller Paterson Signature Date/Time: 01/15/2024/6:15:47 PM    Final    CT CHEST WO CONTRAST Result Date: 01/15/2024 CLINICAL DATA:  Myelodysplastic syndrome. Acute respiratory failure. * Tracking Code: BO * EXAM: CT CHEST WITHOUT CONTRAST TECHNIQUE: Multidetector CT imaging of the chest was performed following the standard protocol without IV contrast. RADIATION DOSE REDUCTION: This exam was performed according to the departmental dose-optimization program which includes automated exposure control, adjustment of the mA and/or kV according to patient size and/or use of iterative reconstruction technique. COMPARISON:  01/15/2024 chest radiograph and CT chest 08/15/2022 FINDINGS: Cardiovascular: Atheromatous vascular calcifications involving the thoracic aorta and left anterior descending coronary artery. Mitral valve calcification. Moderate pericardial effusion, similar to minimally increased from 08/15/2022. Mild cardiomegaly. Mediastinum/Nodes: Nasogastric tube enters the stomach. Upper normal sized paratracheal and AP window lymph nodes. Hilar adenopathy if present is obscured by motion artifact and lack of IV contrast. Lungs/Pleura: Endotracheal tube satisfactorily positioned. Below the endotracheal tube there is substantial narrowing of the tracheal lumen and proximal main stem bronchial lumens probably from a mucus plug and less likely due to  tracheobronchomalacia. Differentiation is difficult due to motion artifact. Bilateral airspace opacities with air bronchograms in all lobes of the lung but with consolidation greatest in the lung bases.  Vertical gradient suggests at least a component of acute pulmonary edema, potentially superimposed on aspiration pneumonitis. Upper Abdomen: Lateral segment left hepatic lobe cyst not changed from 08/25/2021 MRI. Abdominal aortic atherosclerosis. Benign peripelvic renal cysts warrant no further imaging workup. Moderately dilated fluid-filled colon. Musculoskeletal: Lower cervical plate and screw fixator. Thoracic spondylosis. IMPRESSION: 1. Below the endotracheal tube tip, there is substantial narrowing of the tracheal lumen and proximal main stem bronchial lumens probably from a mucus plug and less likely due to tracheobronchomalacia. Consider deep suctioning attempt. 2. Bilateral airspace opacities with air bronchograms in all lobes of the lung but with consolidation greatest in the lung bases. Vertical gradient suggests at least a component of acute pulmonary edema, potentially superimposed on aspiration pneumonitis. 3. Moderate pericardial effusion, similar to minimally increased from 08/15/2022. 4. Mild cardiomegaly. 5. Moderately dilated fluid-filled colon. 6.  Aortic Atherosclerosis (ICD10-I70.0). Electronically Signed   By: Ryan Salvage M.D.   On: 01/15/2024 10:42   DG Abd 1 View Result Date: 01/15/2024 EXAM: 1 VIEW XRAY OF THE ABDOMEN 01/15/2024 06:01:46 AM COMPARISON: 01/14/2024 CLINICAL HISTORY: Encounter for nasogastric (NG) tube placement [id number]. POST INTUBATION ET TUBE/OG TUBE PLACEMENT. FINDINGS: LINES, TUBES AND DEVICES: Enteric tube in place with tip and side port overlying the expected region of the gastric lumen. Right mainstem bronchus intubation. BOWEL: Gaseous distension of the transverse colon. SOFT TISSUES: No opaque urinary calculi. BONES: No acute osseous abnormality. IMPRESSION: 1. Enteric tube in place with tip and side port overlying the expected region of the gastric lumen. 2. Right mainstem bronchus intubation. Electronically signed by: Waddell Calk MD 01/15/2024 06:07 AM EDT RP  Workstation: [id number]   Portable Chest x-ray Result Date: 01/15/2024 EXAM: 1 VIEW XRAY OF THE CHEST 01/15/2024 06:01:46 AM COMPARISON: 01/15/2024 02:47 AM CLINICAL HISTORY: [id number] Endotracheal tube present [id number]. POST INTUBATION ET TUBE/OG TUBE PLACEMENT FINDINGS: LINES, TUBES AND DEVICES: Interval placement of ET tube with tip in the right mainstem bronchus. Enteric tube tip and sideboard are well below the GE junction. LUNGS AND PLEURA: Persistent and progressive bilateral mid and lower lung zone opacities. HEART AND MEDIASTINUM: Stable cardiac enlargement. BONES AND SOFT TISSUES: No acute osseous abnormality. IMPRESSION: 1. Findings consistent with right mainstem endotracheal intubation. 2. Enteric tube tip and side-port are below the GE junction. 3. Persistent and progressive bilateral mid and lower lung zone opacities. 4. Stable cardiac enlargement. 5. The urgent finding will be called to the ordering provider by the Professional Radiology Assistants (PRAs) and documented in the [facility] dashboard. Electronically signed by: Waddell Calk MD 01/15/2024 06:05 AM EDT RP Workstation: [id number]   DG Chest 1 View Result Date: 01/15/2024 EXAM: 1 VIEW XRAY OF THE CHEST 01/15/2024 03:05:25 AM COMPARISON: 01/12/2024 CLINICAL HISTORY: Dyspnea FINDINGS: LUNGS AND PLEURA: Increased interstitial markings, favoring mild-to-moderate interstitial edema, right perihilar predominant. No focal pulmonary opacity. No pleural effusion. No pneumothorax. HEART AND MEDIASTINUM: Cardiomegaly, unchanged. No acute abnormality of the mediastinal silhouette. BONES AND SOFT TISSUES: Cervical fusion hardware. No acute osseous abnormality. IMPRESSION: 1. New mild-to-moderate interstitial edema. 2. Cardiomegaly. Electronically signed by: Pinkie Pebbles MD 01/15/2024 03:12 AM EDT RP Workstation: [id number]   DG Abd 1 View Result Date: 01/14/2024 CLINICAL DATA:  Abdominal pain EXAM: ABDOMEN - 1 VIEW COMPARISON:  CT 01/12/2024  FINDINGS: Gaseous distension of bowel centrally in the abdomen felt to most likely  reflect the cecum. No small bowel distention. No organomegaly or free air. No suspicious calcification. IMPRESSION: Nonspecific, nonobstructive bowel gas pattern. Mild gaseous distention of the cecum. Electronically Signed   By: Franky Crease M.D.   On: 01/14/2024 17:06   US  Venous Img Lower Bilateral (DVT) Result Date: 01/13/2024 CLINICAL DATA:  73 year old female with lower extremity edema. EXAM: BILATERAL LOWER EXTREMITY VENOUS DOPPLER ULTRASOUND TECHNIQUE: Gray-scale sonography with compression, as well as color and duplex ultrasound, were performed to evaluate the deep venous system(s) from the level of the common femoral vein through the popliteal and proximal calf veins. COMPARISON:  CT Abdomen and Pelvis with contrast 01/09/2024. FINDINGS: VENOUS Normal compressibility of the bilateral common femoral, superficial femoral, and popliteal veins, as well as the visualized calf veins. Visualized portions of the bilateral profunda femoral vein and great saphenous vein unremarkable. No filling defects to suggest DVT on grayscale or color Doppler imaging. Doppler waveforms show normal direction of venous flow, normal respiratory plasticity and response to augmentation. OTHER None. Limitations: none IMPRESSION: No evidence of bilateral lower extremity deep venous thrombosis. Electronically Signed   By: VEAR Hurst M.D.   On: 01/13/2024 03:58   DG Chest Portable 1 View Result Date: 01/12/2024 CLINICAL DATA:  SOB, abd pain EXAM: DG CHEST 1V PORT COMPARISON:  September 26, 2023 FINDINGS: No focal airspace consolidation, pleural effusion, or pneumothorax. Moderate cardiomegaly. Aortic atherosclerosis. No acute fracture or destructive lesions. Multilevel thoracic osteophytosis. Cervical fusion hardware. IMPRESSION: Cardiomegaly.  No pneumonia or pulmonary edema. Electronically Signed   By: Rogelia Myers M.D.   On: 01/12/2024 14:54   CT  ABDOMEN PELVIS W CONTRAST Result Date: 01/09/2024 CLINICAL DATA:  Left lower quadrant pain. EXAM: CT ABDOMEN AND PELVIS WITH CONTRAST TECHNIQUE: Multidetector CT imaging of the abdomen and pelvis was performed using the standard protocol following bolus administration of intravenous contrast. RADIATION DOSE REDUCTION: This exam was performed according to the departmental dose-optimization program which includes automated exposure control, adjustment of the mA and/or kV according to patient size and/or use of iterative reconstruction technique. CONTRAST:  OMNIPAQUE  IOHEXOL  300 MG/ML  SOLN COMPARISON:  09/04/2023 FINDINGS: Lower Chest: No acute findings. Hepatobiliary: Stable small cyst in left hepatic lobe. No suspicious hepatic masses identified. Gallbladder is unremarkable. No evidence of biliary ductal dilatation. Pancreas:  No mass or inflammatory changes. Spleen: Within normal limits in size and appearance. Adrenals/Urinary Tract: No suspicious masses identified. No evidence of ureteral calculi or hydronephrosis. Unremarkable unopacified urinary bladder. Stomach/Bowel: Mild wall thickening is seen involving the rectosigmoid colon with adjacent soft tissue stranding, which is new since previous study. This is consistent with mild proctocolitis, although no definite diverticular disease is seen. No No evidence of perforation or abscess. Vascular/Lymphatic: No pathologically enlarged lymph nodes. No acute vascular findings. Reproductive:  No mass or other significant abnormality. Other:  None. Musculoskeletal:  No suspicious bone lesions identified. IMPRESSION: Mild proctocolitis involving the rectum and distal sigmoid colon. No evidence of abscess or other complication. Electronically Signed   By: Norleen DELENA Kil M.D.   On: 01/09/2024 18:32    Microbiology: Results for orders placed or performed during the hospital encounter of 01/12/24  MRSA Next Gen by PCR, Nasal     Status: None   Collection Time:  01/15/24  6:01 AM   Specimen: Nasal Mucosa; Nasal Swab  Result Value Ref Range Status   MRSA by PCR Next Gen NOT DETECTED NOT DETECTED Final    Comment: (NOTE) The GeneXpert MRSA Assay (FDA approved  for NASAL specimens only), is one component of a comprehensive MRSA colonization surveillance program. It is not intended to diagnose MRSA infection nor to guide or monitor treatment for MRSA infections. Test performance is not FDA approved in patients less than 5 years old. Performed at [facility], [address] [place], [place] [address]   Culture, Respiratory w Gram Stain     Status: None   Collection Time: 01/15/24  2:51 PM   Specimen: Tracheal Aspirate; Respiratory  Result Value Ref Range Status   Specimen Description   Final    TRACHEAL ASPIRATE Performed at [facility], [address] [place], [place] [address]    Special Requests   Final    NONE Performed at [facility], [address] [place], [place] [address]    Gram Stain   Final    RARE WBC PRESENT, PREDOMINANTLY PMN NO ORGANISMS SEEN    Culture   Final    NO GROWTH 2 DAYS Performed at [facility], [address], [place], [place] [address]    Report Status 01/18/2024 FINAL  Final    Labs: CBC: Recent Labs  Lab 01/16/24 0459 01/16/24 1415 01/16/24 2205 01/17/24 0241 01/17/24 0807 01/17/24 1415 01/17/24 2137 01/18/24 0524 01/19/24 0355 01/20/24 1002 01/22/24 0410  WBC 11.1* 10.1  --  14.7*  --   --   --  11.5* 5.7  --   --   NEUTROABS 9.6*  --   --  11.6*  --   --   --   --   --   --   --   HGB 8.2* 7.6*   < > 7.7* 8.6* 8.2* 7.8* 7.4* 7.9* 9.0* 8.5*  HCT 25.7* 23.7*   < > 23.4* 26.8* 25.4* 23.8* 23.0* 24.6*  --   --   MCV 111.7* 110.7*  --  112.0*  --   --   --  110.6* 110.8*  --   --   PLT 145* 150  --  157  --   --   --  167 167  --   --    < > = values in this interval not displayed.   Basic Metabolic Panel: Recent Labs  Lab 01/16/24 0459  01/16/24 1825 01/17/24 0241 01/18/24 0524 01/19/24 0355 01/20/24 0340 01/22/24 0410  NA 140 138 141 141 142  --  140  K 3.0* 3.6 3.5 3.3* 3.5  --  3.8  CL 100 101 99 96* 100  --  107  CO2 28 28 30  32 32  --  27  GLUCOSE 176* 127* 118* 100* 155*  --  111*  BUN 16 19 21  24* 23  --  15  CREATININE 1.43* 1.54* 1.35* 1.16* 1.10* 0.98 0.76  CALCIUM  8.9 8.7* 8.6* 8.5* 8.2*  --  8.4*  MG 2.0  --  2.0 2.5* 2.2  --   --   PHOS 3.6  --  4.0 4.1 2.8  --   --    Liver Function Tests: Recent Labs  Lab 01/16/24 0459 01/17/24 0241 01/18/24 0524 01/19/24 0355  AST 15 17  --   --   ALT 11 11  --   --   ALKPHOS 55 44  --   --   BILITOT 1.0 1.0  --   --   PROT 6.6 6.0*  --   --   ALBUMIN 3.2* 2.8* 2.9* 2.6*   CBG: Recent Labs  Lab 01/17/24 0327 01/17/24 9188  01/17/24 1135 01/17/24 1633 01/17/24 1917  GLUCAP 114* 92 98 154* 142*    Discharge time spent: greater than 30 minutes.  Signed: Charlie Patterson, MD [facility] 01/22/2024

## 2024-01-22 NOTE — Progress Notes (Signed)
 Nutrition Follow-up  DOCUMENTATION CODES:   Obesity unspecified  INTERVENTION:   -D/c Ensure Plus High Protein po BID, each supplement provides 350 kcal and 20 grams of protein  -Magic cup BID with meals, each supplement provides 290 kcal and 9 grams of protein  -Continue regular diet -Continue MVI with minerals daily -Obtain new wt   NUTRITION DIAGNOSIS:   Inadequate oral intake related to inability to eat as evidenced by NPO status.  Progressing; advanced to PO diet on 01/16/24  GOAL:   Patient will meet greater than or equal to 90% of their needs  Progressing   MONITOR:   PO intake, Supplement acceptance  REASON FOR ASSESSMENT:   Ventilator    ASSESSMENT:   Pt first seen on 09/19 with c/o LLQ abdominal pain with black stool onset several days prior to presentation.  Per ED notes on 09/16 pt was sent over from the cancer center to the ER with abdominal pain and dark tarry stools.  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  She received metronidazole  and ceftriaxone .  EDP recommended hospital admission, however pt refused.  She was discharged from the ER and prescribed Augmentin  and instructed to follow-up with her PCP.  9/22- s/p bronchoscopy 9/23- extubated, advanced to heart healthy diet 9/24- s/p BSE- regular diet with thin liquids 9/25- weaned from HFNC to 2 L [place]  Reviewed I/O's: +1.6 L x 24 hours and +3.8 L since admission  Pt lying in bed at time of visit. SHe did not respond to name being called. No family at bedside.   Pt with good appetite. Noted meal completions 50-100%. Pt is refusing Ensure supplements.   Per GI notes, plan for EGD today. Pt has refused colonoscopy.   No new wt since last visit.   Per CIR notes, plan to transfer to CIR once bed is available.   Medications reviewed and include protonix  and sodium chloride  infusion @ 20 ml/hr.   Labs reviewed: CBGS: 142-154 (inpatient orders for  glycemic control are none).    Diet Order:   Diet Order             Diet NPO time specified Except for: Sips with Meds  Diet effective midnight           Diet Heart Room service appropriate? Yes; Fluid consistency: Thin  Diet effective now                   EDUCATION NEEDS:   Not appropriate for education at this time  Skin:  Skin Assessment: Reviewed RN Assessment  Last BM:  01/16/24  Height:   Ht Readings from Last 1 Encounters:  01/12/24 5' 3 (1.6 m)    Weight:   Wt Readings from Last 1 Encounters:  01/17/24 81.7 kg    Ideal Body Weight:  52.3 kg  BMI:  Body mass index is 31.91 kg/m.  Estimated Nutritional Needs:   Kcal:  1650-1850  Protein:  85-100 grams  Fluid:  1.6-1.8 L    Margery ORN, RD, LDN, CDCES Registered Dietitian III Certified Diabetes Care and Education Specialist If unable to reach this RD, please use RD Inpatient group chat on secure chat between hours of 8am-4 pm daily

## 2024-01-23 ENCOUNTER — Telehealth: Payer: Self-pay

## 2024-01-23 NOTE — Telephone Encounter (Signed)
 FYI

## 2024-01-23 NOTE — Telephone Encounter (Signed)
 Copied from CRM #[id number]. Topic: General - Other >> Jan 23, 2024 11:43 AM Carlyon D wrote: Reason for CRM: [facility] calling from [facility] in regards to pt. Care is going to be delayed, can not see pt in the next 48 hours due to them being completely full. So they will see patient for PT and OT starting 10/3. [facility] call back # [phone]

## 2024-01-23 NOTE — Transitions of Care (Post Inpatient/ED Visit) (Signed)
   01/23/2024  Name: Sarah [name] MRN: [id number] DOB: January 15, 1951  Today's TOC FU Call Status: Today's TOC FU Call Status:: Unsuccessful Call (1st Attempt) Unsuccessful Call (1st Attempt) Date: 01/23/24  Attempted to reach the patient regarding the most recent Inpatient/ED visit.  Follow Up Plan: Additional outreach attempts will be made to reach the patient to complete the Transitions of Care (Post Inpatient/ED visit) call.   Arvin Seip RN, BSN, CCM [employer], Population Health Case Manager Phone: [phone]

## 2024-01-24 ENCOUNTER — Telehealth: Payer: Self-pay | Admitting: *Deleted

## 2024-01-24 ENCOUNTER — Inpatient Hospital Stay: Admitting: Family

## 2024-01-24 NOTE — Transitions of Care (Post Inpatient/ED Visit) (Signed)
 01/24/2024  Name: Sarah [name] MRN: [id number] DOB: 1950/06/08  Today's TOC FU Call Status: Today's TOC FU Call Status:: Successful TOC FU Call Completed TOC FU Call Complete Date: 01/24/24 Patient's Name and Date of Birth confirmed.  Transition Care Management Follow-up Telephone Call Date of Discharge: 01/22/24 Discharge Facility: [facility]) Type of Discharge: Inpatient Admission Primary Inpatient Discharge Diagnosis:: Acute respiratory failure with hypoxia/ aspiration pneumonia/ COPD with acute exacerbation How have you been since you were released from the hospital?: Better (doing real good. I am eating well and have been walking) Any questions or concerns?: Yes Patient Questions/Concerns:: Patient wasn't aware of the medications to stop until RN went over. Patient Questions/Concerns Addressed: Other: (RN went over medications)  Items Reviewed: Medications obtained,verified, and reconciled?: Yes (Medications Reviewed) Any new allergies since your discharge?: No Dietary orders reviewed?: No Do you have support at home?: Yes People in Home [RPT]: alone Name of Support/Comfort Primary Source: lenore friend  Medications Reviewed Today: Medications Reviewed Today     Reviewed by Kennieth Cathlean DEL, RN (Case Manager) on 01/24/24 at 1532  Med List Status: <None>   Medication Order Taking? Sig Documenting Provider Last Dose Status Informant  albuterol  (VENTOLIN  HFA) 108 (90 Base) MCG/ACT inhaler [id number] Yes Inhale 1-2 puffs into the lungs every 6 (six) hours as needed for wheezing or shortness of breath. [name], MD  Active Self           Med Note SIGRID, TIFFANY N   Mon Sep 04, 2023 10:04 PM) PRN  amLODipine  (NORVASC ) 5 MG tablet [id number] Yes Take 0.5 tablets (2.5 mg total) by mouth daily. Josette Ade, MD  Active   Calcium  Citrate-Vitamin D  (CALCIUM  CITRATE + D PO) [id number] Yes Take 1 tablet by mouth 2 (two) times daily. [provider]  Active Self  citalopram  (CELEXA ) 20 MG tablet [id number] Yes TAKE 1 TABLET BY MOUTH DAILY Tullo, Teresa L, MD  Active Self  ferrous sulfate  324 MG TBEC [id number] Yes Take 1 tablet (324 mg total) by mouth daily with breakfast. 65 mg of elemental iron  Josette Ade, MD  Active   fexofenadine (ALLEGRA) 180 MG tablet [id number] Yes Take 180 mg by mouth daily. [provider]  Active Self  Fluticasone -Umeclidin-Vilant (TRELEGY ELLIPTA ) 200-62.5-25 MCG/ACT AEPB [id number] Yes Inhale 1 puff into the lungs daily. Tamea Dedra CROME, MD  Active Self  gabapentin  (NEURONTIN ) 100 MG capsule [id number] Yes TAKE 1 CAPSULE BY MOUTH 3 TIMES  DAILY [name], MD  Active Self  levothyroxine  (SYNTHROID ) 50 MCG tablet [id number] Yes TAKE 1 TABLET BY MOUTH DAILY ON  AN EMPTY STOMACH WITH A GLASS OF WATER  AT LEAST 30 TO 60 MINUTES  BEFORE BREAKFAST [name], MD  Active Self  pantoprazole  (PROTONIX ) 40 MG tablet [id number] Yes Take 1 tablet (40 mg total) by mouth daily. Josette Ade, MD  Active   rosuvastatin  (CRESTOR ) 10 MG tablet [id number] Yes TAKE ONE TABLET BY MOUTH ONCE DAILY [name], MD  Active Self  traZODone  (DESYREL ) 50 MG tablet [id number] Yes TAKE 1/2 TO 1 TABLET BY MOUTH AT BEDTIME AS NEEDED FOR SLEEP [name], MD  Active Self  vitamin B-12 (CYANOCOBALAMIN ) 1000 MCG tablet [id number] Yes Take 1,000 mcg by mouth daily. [provider]  Active Self           Med Note SYDELL, HERON NOVAK   Tue Sep 12, 2023 12:12 PM) Patient stated hospitalist provider told patient to  stop taking as levels were high.   Med List Note Teretha Renaee SAILOR, RPH-CPP 12/06/23 1153): Revlimid  filled at [facility]            Home Care and Equipment/Supplies: Were Home Health Services Ordered?: Yes Name of [facility] Health Agency[facility] Has Agency set up a time to come to your home?: No EMR reviewed for Home Health Orders: Orders present/patient has not received call  (refer to CM for follow-up) (Patient given the number in case she has not heard from them) Any new equipment or medical supplies ordered?: NA  Functional Questionnaire: Do you need assistance with bathing/showering or dressing?: No Do you need assistance with meal preparation?: No Do you need assistance with eating?: No Do you have difficulty maintaining continence: No Do you need assistance with getting out of bed/getting out of a chair/moving?: Yes (uses a walker) Do you have difficulty managing or taking your medications?: No  Follow up appointments reviewed: PCP Follow-up appointment confirmed?: Yes Date of PCP follow-up appointment?: 02/01/24 Follow-up Provider: Dr Marylynn Henrietta [facility] Follow-up appointment confirmed?: Yes Date of Specialist follow-up appointment?: 01/30/24 Follow-Up Specialty Provider:: Tinnie Dawn Do you need transportation to your follow-up appointment?: No Do you understand care options if your condition(s) worsen?: Yes-patient verbalized understanding  SDOH Interventions Today    Flowsheet Row Most Recent Value  SDOH Interventions   Food Insecurity Interventions Intervention Not Indicated  Housing Interventions Intervention Not Indicated  Transportation Interventions Intervention Not Indicated, Patient Resources (Friends/Family)  Utilities Interventions Intervention Not Indicated   Discussed and offered 30 day TOC program.  Patient  declined.  The patient has been provided with contact information for the care management team and has been advised to call with any health -related questions or concerns.  The patient verbalized understanding with current plan of care.  The patient is directed to their insurance card regarding availability of benefits coverage. Cathlean Headland BSN RN [facility] Health Care Management Coordinator [email or web address] Direct Dial: [phone]  Fax: [phone] Website: [email or web address]

## 2024-01-29 ENCOUNTER — Telehealth: Payer: Self-pay

## 2024-01-29 ENCOUNTER — Other Ambulatory Visit: Payer: Self-pay

## 2024-01-29 DIAGNOSIS — D46Z Other myelodysplastic syndromes: Secondary | ICD-10-CM

## 2024-01-29 NOTE — Telephone Encounter (Signed)
 Copied from CRM #[id number]. Topic: Clinical - Home Health Verbal Orders >> Jan 29, 2024  1:39 PM Alfonso HERO wrote: Caller/Agency: [facility] Callback Number: [phone] Service Requested: Physical Therapy Frequency: 2 week 2 1 week 4 starting today 10/6 Any new concerns about the patient? No

## 2024-01-29 NOTE — Telephone Encounter (Signed)
 Verbal orders given

## 2024-01-30 ENCOUNTER — Encounter: Payer: Self-pay | Admitting: Nurse Practitioner

## 2024-01-30 ENCOUNTER — Inpatient Hospital Stay: Admitting: Nurse Practitioner

## 2024-01-30 ENCOUNTER — Inpatient Hospital Stay: Attending: Internal Medicine

## 2024-01-30 VITALS — BP 136/64 | HR 68 | Temp 97.5°F | Resp 17 | Wt 174.0 lb

## 2024-01-30 DIAGNOSIS — D46Z Other myelodysplastic syndromes: Secondary | ICD-10-CM

## 2024-01-30 DIAGNOSIS — K6389 Other specified diseases of intestine: Secondary | ICD-10-CM | POA: Diagnosis not present

## 2024-01-30 DIAGNOSIS — I3139 Other pericardial effusion (noninflammatory): Secondary | ICD-10-CM | POA: Insufficient documentation

## 2024-01-30 DIAGNOSIS — D696 Thrombocytopenia, unspecified: Secondary | ICD-10-CM | POA: Diagnosis not present

## 2024-01-30 DIAGNOSIS — I871 Compression of vein: Secondary | ICD-10-CM | POA: Insufficient documentation

## 2024-01-30 DIAGNOSIS — M47814 Spondylosis without myelopathy or radiculopathy, thoracic region: Secondary | ICD-10-CM | POA: Diagnosis not present

## 2024-01-30 DIAGNOSIS — K922 Gastrointestinal hemorrhage, unspecified: Secondary | ICD-10-CM

## 2024-01-30 DIAGNOSIS — J398 Other specified diseases of upper respiratory tract: Secondary | ICD-10-CM | POA: Diagnosis not present

## 2024-01-30 DIAGNOSIS — K529 Noninfective gastroenteritis and colitis, unspecified: Secondary | ICD-10-CM | POA: Diagnosis not present

## 2024-01-30 DIAGNOSIS — J9600 Acute respiratory failure, unspecified whether with hypoxia or hypercapnia: Secondary | ICD-10-CM | POA: Diagnosis not present

## 2024-01-30 DIAGNOSIS — Z9841 Cataract extraction status, right eye: Secondary | ICD-10-CM | POA: Insufficient documentation

## 2024-01-30 DIAGNOSIS — D469 Myelodysplastic syndrome, unspecified: Secondary | ICD-10-CM | POA: Diagnosis present

## 2024-01-30 DIAGNOSIS — Z79890 Hormone replacement therapy (postmenopausal): Secondary | ICD-10-CM | POA: Insufficient documentation

## 2024-01-30 DIAGNOSIS — D509 Iron deficiency anemia, unspecified: Secondary | ICD-10-CM | POA: Diagnosis not present

## 2024-01-30 DIAGNOSIS — Z856 Personal history of leukemia: Secondary | ICD-10-CM | POA: Insufficient documentation

## 2024-01-30 DIAGNOSIS — E785 Hyperlipidemia, unspecified: Secondary | ICD-10-CM | POA: Diagnosis not present

## 2024-01-30 DIAGNOSIS — J45909 Unspecified asthma, uncomplicated: Secondary | ICD-10-CM | POA: Insufficient documentation

## 2024-01-30 DIAGNOSIS — K7689 Other specified diseases of liver: Secondary | ICD-10-CM | POA: Insufficient documentation

## 2024-01-30 DIAGNOSIS — E039 Hypothyroidism, unspecified: Secondary | ICD-10-CM | POA: Diagnosis not present

## 2024-01-30 DIAGNOSIS — I700 Atherosclerosis of aorta: Secondary | ICD-10-CM | POA: Diagnosis not present

## 2024-01-30 DIAGNOSIS — I2510 Atherosclerotic heart disease of native coronary artery without angina pectoris: Secondary | ICD-10-CM | POA: Insufficient documentation

## 2024-01-30 DIAGNOSIS — Z9842 Cataract extraction status, left eye: Secondary | ICD-10-CM | POA: Insufficient documentation

## 2024-01-30 DIAGNOSIS — Z79899 Other long term (current) drug therapy: Secondary | ICD-10-CM | POA: Diagnosis not present

## 2024-01-30 DIAGNOSIS — J9809 Other diseases of bronchus, not elsewhere classified: Secondary | ICD-10-CM | POA: Insufficient documentation

## 2024-01-30 DIAGNOSIS — Z833 Family history of diabetes mellitus: Secondary | ICD-10-CM | POA: Insufficient documentation

## 2024-01-30 DIAGNOSIS — Z8249 Family history of ischemic heart disease and other diseases of the circulatory system: Secondary | ICD-10-CM | POA: Insufficient documentation

## 2024-01-30 DIAGNOSIS — K317 Polyp of stomach and duodenum: Secondary | ICD-10-CM | POA: Diagnosis not present

## 2024-01-30 DIAGNOSIS — I119 Hypertensive heart disease without heart failure: Secondary | ICD-10-CM | POA: Diagnosis not present

## 2024-01-30 DIAGNOSIS — N3281 Overactive bladder: Secondary | ICD-10-CM | POA: Insufficient documentation

## 2024-01-30 DIAGNOSIS — Z7982 Long term (current) use of aspirin: Secondary | ICD-10-CM | POA: Diagnosis not present

## 2024-01-30 DIAGNOSIS — Z83438 Family history of other disorder of lipoprotein metabolism and other lipidemia: Secondary | ICD-10-CM | POA: Insufficient documentation

## 2024-01-30 DIAGNOSIS — Z8701 Personal history of pneumonia (recurrent): Secondary | ICD-10-CM | POA: Insufficient documentation

## 2024-01-30 DIAGNOSIS — Z90710 Acquired absence of both cervix and uterus: Secondary | ICD-10-CM | POA: Insufficient documentation

## 2024-01-30 LAB — CBC WITH DIFFERENTIAL (CANCER CENTER ONLY)
Abs Immature Granulocytes: 0.02 K/uL (ref 0.00–0.07)
Basophils Absolute: 0.1 K/uL (ref 0.0–0.1)
Basophils Relative: 2 %
Eosinophils Absolute: 0.1 K/uL (ref 0.0–0.5)
Eosinophils Relative: 3 %
HCT: 31.8 % — ABNORMAL LOW (ref 36.0–46.0)
Hemoglobin: 10.2 g/dL — ABNORMAL LOW (ref 12.0–15.0)
Immature Granulocytes: 0 %
Lymphocytes Relative: 21 %
Lymphs Abs: 1.1 K/uL (ref 0.7–4.0)
MCH: 34.6 pg — ABNORMAL HIGH (ref 26.0–34.0)
MCHC: 32.1 g/dL (ref 30.0–36.0)
MCV: 107.8 fL — ABNORMAL HIGH (ref 80.0–100.0)
Monocytes Absolute: 0.4 K/uL (ref 0.1–1.0)
Monocytes Relative: 7 %
Neutro Abs: 3.5 K/uL (ref 1.7–7.7)
Neutrophils Relative %: 67 %
Platelet Count: 234 K/uL (ref 150–400)
RBC: 2.95 MIL/uL — ABNORMAL LOW (ref 3.87–5.11)
RDW: 15 % (ref 11.5–15.5)
WBC Count: 5.3 K/uL (ref 4.0–10.5)
nRBC: 0 % (ref 0.0–0.2)

## 2024-01-30 LAB — CMP (CANCER CENTER ONLY)
ALT: 15 U/L (ref 0–44)
AST: 16 U/L (ref 15–41)
Albumin: 3.6 g/dL (ref 3.5–5.0)
Alkaline Phosphatase: 58 U/L (ref 38–126)
Anion gap: 7 (ref 5–15)
BUN: 17 mg/dL (ref 8–23)
CO2: 24 mmol/L (ref 22–32)
Calcium: 9.2 mg/dL (ref 8.9–10.3)
Chloride: 106 mmol/L (ref 98–111)
Creatinine: 0.69 mg/dL (ref 0.44–1.00)
GFR, Estimated: >60 mL/min (ref >60)
Glucose, Bld: 107 mg/dL — ABNORMAL HIGH (ref 70–99)
Potassium: 3.5 mmol/L (ref 3.5–5.1)
Sodium: 137 mmol/L (ref 135–145)
Total Bilirubin: 0.7 mg/dL (ref 0.0–1.2)
Total Protein: 7 g/dL (ref 6.5–8.1)

## 2024-01-30 NOTE — Patient Instructions (Signed)
 VISIT SUMMARY: Today, we discussed your recent hospitalization due to gastrointestinal bleeding and colitis. You experienced severe symptoms, including pain, vomiting, and respiratory failure, which required intensive care. Your hemoglobin levels dropped significantly but improved with IV iron  therapy. A CT scan confirmed an infection in your colon, and you were treated with antibiotics. You have been working on regaining your strength and have seen some improvement in your symptoms.  YOUR PLAN: -INFECTIOUS COLITIS WITH ACUTE GASTROINTESTINAL BLEEDING AND IRON  DEFICIENCY ANEMIA: You had a significant blood loss and iron  deficiency anemia due to an infection in your colon, which caused gastrointestinal bleeding. Your hemoglobin levels have improved with IV iron  therapy. Continue taking Augmentin  and complete the course. Monitor for any recurrence of symptoms such as black tarry stools or abdominal pain. We will reassess your iron  levels in future visits to determine if you need more IV iron . We will delay your colonoscopy until you are stronger and the infection is resolved.  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 You were previously on Revlimid , which may have contributed to your recent health issues. We have stopped this medication for now due to the complications you experienced. We will reconsider restarting it once you are stronger and more stable, and we may adjust the dose if it is restarted in the future.  INSTRUCTIONS: Please continue taking Augmentin  as prescribed and monitor for any recurrence of symptoms such as black tarry stools or abdominal pain. We will reassess your iron  levels in future visits. We will delay your colonoscopy for now or until you get stronger, infection resolves. We may have to reconsider if you have recurrent symptoms. At next visit we will  revisit if/when we restart Revlimid .   Great to see you today.  Tinnie, NP  Contains text generated by [employer].

## 2024-01-30 NOTE — Progress Notes (Signed)
 Patient states she feels better since recent hospital discharge

## 2024-01-30 NOTE — Progress Notes (Signed)
 [facility] CONSULT NOTE  Patient Care Team: [name], MD as PCP - General (Internal Medicine) [name], MD as PCP - Cardiology (Cardiology) [name], MD (Internal Medicine) [name], [name], MD (General Surgery) [name], MD as Consulting Physician (Oncology) Aundria Ladell POUR, MD as Consulting Physician (Gastroenterology)  CHIEF COMPLAINTS/PURPOSE OF CONSULTATION: MDS  HEMATOLOGY HISTORY: Oncology History Overview Note  DEC 2023- DIAGNOSIS: LOW GRADE MDS- 5 Q DEL;   # MACROCYTIC ANEMIA [since 2022- April hb 11; MCV-103; platelet- 120s] EGD/ colonoscopy >>> 10 years ago; OCT 2022-slight improvement blood counts/stable; hepatitis HIV negative.  Myeloma panel negative.   BONE MARROW, ASPIRATE, CLOT, CORE: -Hypercellular bone marrow for age with dyspoietic changes -See comment  PERIPHERAL BLOOD: -Macrocytic anemia -Thrombocytopenia  COMMENT:  The overall findings are very concerning for involvement by a low-grade myelodysplastic syndrome.  The differential diagnosis includes changes related to nutritional deficiency, medication, immune mediated process, infection, etc. Correlation with cytogenetic and FISH studies is recommended.  MICROSCOPIC DESCRIPTION:  PERIPHERAL BLOOD SMEAR: The red blood cells display mild anisopoikilocytosis with mild polychromasia.  The white blood cells are normal in number with occasional hypogranular and/or hypolobated neutrophils.  Scattered reactive appearing lymphocytes are present.  The platelets are decreased in number.  BONE MARROW ASPIRATE: Bone marrow particles present. Erythroid precursors: Progressive maturation with occasional late precursors displaying nuclear cytoplasmic dyssynchrony. Granulocytic precursors: Progressive maturation with many mature neutrophils displaying hypogranulation and/or hypolobation.  No increase in blastic cells identified. Megakaryocytes: Abundant with  many small and/or hypolobated/unilobated forms Lymphocytes/plasma cells: Large aggregates not present  TOUCH PREPARATIONS: A mixture of cell types present.  CLOT AND BIOPSY: The sections show variable cellularity ranging from 10% focally to 70% with a mixture of cell types.  Expansile sheets of blastic cells are not identified.  A small interstitial lymphoid aggregate composed of small lymphoid cells is seen.  IRON  STAIN: Iron  stains are performed on a bone marrow aspirate or touch imprint smear and section of clot. The controls stained appropriately.       Storage Iron :  Abundant      Ring Sideroblasts: Absent  ADDITIONAL DATA/TESTING: The specimen was sent for cytogenetic analysis and FISH for MDS and a separate report will follow. Flow cytometric analysis (WLS [phone]) was performed and shows T cells with nonspecific changes.  No monoclonal B-cell population or significant CD34 positive blastic population identified.  # LATE AUG 2025- start rev 10 mg- 3 w/1 w     #Incidental right hepatic lobe 13 mm hypoechoic lesion [ultrasound-October 2022]-MRI OCT 2022-   Low clinical concern for metastatic disease; however need to rule out primary HCC versus others.  1. There are 3 small liver lesions, 2 of which represent simple cysts. 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 MAY 2023-abdominal MRI -benign cyst no further surveillance recommended.   #October 2022-ultrasound incidental bilateral mild to moderate hydronephrosis.-Rs/p evaluation with Dr.Brandon urology.     MDS (myelodysplastic syndrome) (HCC)  04/20/2022 Initial Diagnosis   MDS (myelodysplastic syndrome), low grade (HCC)    HISTORY OF PRESENTING ILLNESS: Ambulating independently.  With friend.   Discussed the use of AI scribe software for clinical note transcription with the patient, who gave verbal consent to proceed.  Erminio JAYSON Silvan 73  y.o. female with above history of MD, low-grade with 5q deletion, who presents today for hospital follow-up.  1 time she was admitted to [facility] ER on  01/12/2024 with complaints of left lower quadrant abdominal pain with black stools.  She previously been seen in/16/25 for abdominal pain and black tarry stools.  CT scan returned for proctosigmoid colitis without evidence of diverticulitis.  She received metronidazole  and ceftriaxone .  ED provider recommended hospitalist admission however, patient refused.  She was discharged on Augmentin .  Unfortunately, her pain got worse and, for despite compliance with Augmentin .  She saw GI who recommended colonoscopy and biopsy.  She ingested the bowel prep and developed acute hypoxic secondary to aspiration following multiple episodes of emesis requiring transfer to ICU and mechanical intubation.  She was extubated on high flow nasal cannula and BiPAP.  She eventually was able to undergo EGD on 9/29 which showed normal esophagus and duodenum and gastric polyps in the stomach.  No signs of bleeding.  She declined colonoscopy.  Hemoglobin was 8.5.  She was off oxygen prior to discharge.  Inpatient rehab was denied by insurance and she ultimately was discharged on 01/22/2024 to home with home health.  During her hospitalization, she experienced a significant drop in hemoglobin to 7.4, necessitating IV iron  therapy, which improved her hemoglobin to 10.2. A blood transfusion was considered but not performed as her levels stabilized with iron  supplementation.  She experienced melena attributed to the gastrointestinal bleeding. A CT scan confirmed an infection in her colon, and she was treated with antibiotics, including Augmentin . She continues to take antibiotics and reports her stools have normalized in color, though they remain abnormal in consistency.  She has experienced significant weight loss, dropping from 190 to 175 pounds during her illness. She feels weak and is working  on regaining her strength with home therapy and increased physical activity.  Her current medications include amoxicillin  and Augmentin . Revlimid  has been discontinued since her hospitalization. She was also taken off baby aspirin  and a medication for overactive bladder during her hospital stay.  No current black or tarry stools. Improved abdominal pain. No current respiratory issues.   Review of Systems  Constitutional:  Positive for malaise/fatigue. Negative for chills, diaphoresis, fever and weight loss.  HENT:  Negative for nosebleeds and sore throat.   Eyes:  Negative for double vision.  Respiratory:  Negative for cough, hemoptysis, sputum production, shortness of breath and wheezing.   Cardiovascular:  Negative for chest pain, palpitations, orthopnea and leg swelling.  Gastrointestinal:  Negative for abdominal pain, blood in stool, constipation, diarrhea, heartburn, melena, nausea and vomiting.  Genitourinary:  Negative for dysuria, frequency and urgency.  Musculoskeletal:  Positive for joint pain. Negative for back pain.  Skin: Negative.  Negative for itching and rash.  Neurological:  Negative for dizziness, tingling, focal weakness, weakness and headaches.  Endo/Heme/Allergies:  Bruises/bleeds easily.  Psychiatric/Behavioral:  Negative for depression. The patient is not nervous/anxious and does not have insomnia.     MEDICAL HISTORY:  Past Medical History:  Diagnosis Date   Arthritis    Asthma    CAP (community acquired pneumonia) 08/11/2022   Depression    Dyspnea    with heat   Environmental and seasonal allergies    GERD (gastroesophageal reflux disease)    Hyperlipidemia    Hypertension    Hypothyroidism    Leukemia (HCC)    Presence of dental prosthetic device    Dental implants   Wheezing     SURGICAL HISTORY: Past Surgical History:  Procedure Laterality Date   ABDOMINAL HYSTERECTOMY  04/26/1987   ANTERIOR CERVICAL DECOMPRESSION/DISCECTOMY FUSION 4 LEVELS  N/A 11/10/2021   Procedure:  C3-7 ANTERIOR CERVICAL DISCECTOMY AND FUSION (GLOBUS FORGE);  Surgeon: Clois Fret, MD;  Location: [facility] ORS;  Service: Neurosurgery;  Laterality: N/A;   BROW LIFT Bilateral 02/07/2020   Procedure: BLEPHAROPLASTY UPPER EYELID; W/EXCESS SKIN BROW PTOSIS REPAIR BILATERAL;  Surgeon: Ashley Greig HERO, MD;  Location: [facility];  Service: Ophthalmology;  Laterality: Bilateral;   CATARACT EXTRACTION W/PHACO Right 09/13/2016   Procedure: CATARACT EXTRACTION PHACO AND INTRAOCULAR LENS PLACEMENT (IOC);  Surgeon: Jaye Fallow, MD;  Location: [facility] ORS;  Service: Ophthalmology;  Laterality: Right;  US  00:38 AP% 14.3 CDE 5.51 Fluid pack lot # [id number]   CATARACT EXTRACTION W/PHACO Left 10/11/2016   Procedure: CATARACT EXTRACTION PHACO AND INTRAOCULAR LENS PLACEMENT (IOC);  Surgeon: Jaye Fallow, MD;  Location: [facility] ORS;  Service: Ophthalmology;  Laterality: Left;  US  00:30 AP% 11.3 CDE 3.50 fluid pack lot # [id number]   COLONOSCOPY WITH PROPOFOL  N/A 09/30/2014   Procedure: COLONOSCOPY WITH PROPOFOL ;  Surgeon: Reyes LELON Cota, MD;  Location: [facility] ENDOSCOPY;  Service: Endoscopy;  Laterality: N/A;   COLONOSCOPY WITH PROPOFOL  N/A 03/24/2021   Procedure: COLONOSCOPY WITH PROPOFOL ;  Surgeon: Unk Corinn Skiff, MD;  Location: [facility] END[facility]COPY;  Service: Gastroenterology;  Laterality: N/A;   ESOPHAGOGASTRODUODENOSCOPY N/A 09/30/2014   Procedure: ESOPHAGOGASTRODUODENOSCOPY (EGD);  Surgeon: Reyes LELON Cota, MD;  Location: [facility] ENDOSCOPY;  Service: Endoscopy;  Laterality: N/A;   ESOPHAGOGASTRODUODENOSCOPY N/A 03/24/2021   Procedure: ESOPHAGOGASTRODUODENOSCOPY (EGD);  Surgeon: Unk Corinn Skiff, MD;  Location[facility];  Service: Gastroenterology;  Laterality: N/A;   ESOPHAGOGASTRODUODENOSCOPY N/A 01/22/2024   Procedure: EGD (ESOPHAGOGASTRODUODENOSCOPY);  Surgeon: Jinny Carmine, MD;  Location: [facility] ENDOSCOPY;  Service: Endoscopy;  Laterality: N/A;   FOOT  SURGERY Bilateral 04/26/1983   KNEE SURGERY  04/25/2014    SOCIAL HISTORY: Social History   Socioeconomic History   Marital status: Single    Spouse name: Not on file   Number of children: Not on file   Years of education: Not on file   Highest education level: Not on file  Occupational History   Not on file  Tobacco Use   Smoking status: Never   Smokeless tobacco: Never  Vaping Use   Vaping status: Never Used  Substance and Sexual Activity   Alcohol use: No   Drug use: No   Sexual activity: Not Currently  Other Topics Concern   Not on file  Social History Narrative   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 No smoking; no alcohol. Lives in [place], [place] lives self/kitty.    Social Drivers of Corporate investment banker Strain: Low Risk  (05/15/2023)   Overall Financial Resource Strain (CARDIA)    Difficulty of Paying Living Expenses: Not hard at all  Food Insecurity: No Food Insecurity (01/24/2024)   Hunger Vital Sign    Worried About Running Out of Food in the Last Year: Never true    Ran Out of Food in the Last Year: Never true  Transportation Needs: No Transportation Needs (01/24/2024)   PRAPARE - Administrator, Civil Service (Medical): No    Lack of Transportation (Non-Medical): No  Physical Activity: Inactive (05/15/2023)   Exercise Vital Sign    Days of Exercise per Week: 0 days    Minutes of Exercise per Session: 0 min  Stress: No Stress Concern Present (05/15/2023)   [employer] of Occupational Health - Occupational Stress Questionnaire    Feeling of Stress : Not at all  Social Connections: Moderately Isolated (01/12/2024)   Social Connection and Isolation Panel  Frequency of Communication with Friends and Family: Once a week    Frequency of Social Gatherings with Friends and Family: Once a week    Attends Religious Services: 1 to 4 times per year    Active Member of [employer] or Organizations: Yes    Attends Banker Meetings:  1 to 4 times per year    Marital Status: Never married  Intimate Partner Violence: Not At Risk (01/24/2024)   Humiliation, Afraid, Rape, and Kick questionnaire    Fear of Current or Ex-Partner: No    Emotionally Abused: No    Physically Abused: No    Sexually Abused: No    FAMILY HISTORY: Family History  Problem Relation Age of Onset   Diabetes Mother    Hyperlipidemia Mother    Heart disease Mother    Prostate cancer Neg Hx    Kidney cancer Neg Hx    Bladder Cancer Neg Hx     ALLERGIES:  has no known allergies.  MEDICATIONS:  Current Outpatient Medications  Medication Sig Dispense Refill   albuterol  (VENTOLIN  HFA) 108 (90 Base) MCG/ACT inhaler Inhale 1-2 puffs into the lungs every 6 (six) hours as needed for wheezing or shortness of breath. 8.5 g 11   amLODipine  (NORVASC ) 5 MG tablet Take 0.5 tablets (2.5 mg total) by mouth daily.     Calcium  Citrate-Vitamin D  (CALCIUM  CITRATE + D PO) Take 1 tablet by mouth 2 (two) times daily.     citalopram  (CELEXA ) 20 MG tablet TAKE 1 TABLET BY MOUTH DAILY 100 tablet 2   ferrous sulfate  324 MG TBEC Take 1 tablet (324 mg total) by mouth daily with breakfast. 65 mg of elemental iron      fexofenadine (ALLEGRA) 180 MG tablet Take 180 mg by mouth daily.     Fluticasone -Umeclidin-Vilant (TRELEGY ELLIPTA ) 200-62.5-25 MCG/ACT AEPB Inhale 1 puff into the lungs daily. 28 each 0   gabapentin  (NEURONTIN ) 100 MG capsule TAKE 1 CAPSULE BY MOUTH 3 TIMES  DAILY 300 capsule 2   levothyroxine  (SYNTHROID ) 50 MCG tablet TAKE 1 TABLET BY MOUTH DAILY ON  AN EMPTY STOMACH WITH A GLASS OF WATER  AT LEAST 30 TO 60 MINUTES  BEFORE BREAKFAST 100 tablet 2   pantoprazole  (PROTONIX ) 40 MG tablet Take 1 tablet (40 mg total) by mouth daily. 30 tablet 0   rosuvastatin  (CRESTOR ) 10 MG tablet TAKE ONE TABLET BY MOUTH ONCE DAILY 100 tablet 1   traZODone  (DESYREL ) 50 MG tablet TAKE 1/2 TO 1 TABLET BY MOUTH AT BEDTIME AS NEEDED FOR SLEEP 90 tablet 3   vitamin B-12 (CYANOCOBALAMIN )  1000 MCG tablet Take 1,000 mcg by mouth daily.     No current facility-administered medications for this visit.      PHYSICAL EXAMINATION: Vitals:   01/30/24 1119 01/30/24 1122  BP: (!) 146/63 136/64  Pulse: 68   Resp: 17   Temp: (!) 97.5 F (36.4 C)   SpO2: 97%    Filed Weights   01/30/24 1119  Weight: 174 lb (78.9 kg)   Physical Exam MEASUREMENTS: Weight- 175.  Physical Exam Vitals and nursing note reviewed.  HENT:     Head: Normocephalic and atraumatic.     Mouth/Throat:     Pharynx: Oropharynx is clear.  Eyes:     Extraocular Movements: Extraocular movements intact.     Pupils: Pupils are equal, round, and reactive to light.  Cardiovascular:     Rate and Rhythm: Normal rate and regular rhythm.  Pulmonary:     Comments:  Decreased breath sounds bilaterally.  Abdominal:     Palpations: Abdomen is soft.  Musculoskeletal:        General: Normal range of motion.     Cervical back: Normal range of motion.  Skin:    General: Skin is warm.  Neurological:     General: No focal deficit present.     Mental Status: She is alert and oriented to person, place, and time.  Psychiatric:        Behavior: Behavior normal.        Judgment: Judgment normal.    LABORATORY DATA:  I have reviewed the data as listed Lab Results  Component Value Date   WBC 5.3 01/30/2024   HGB 10.2 (L) 01/30/2024   HCT 31.8 (L) 01/30/2024   MCV 107.8 (H) 01/30/2024   PLT 234 01/30/2024   Recent Labs    01/16/24 0459 01/16/24 1825 01/17/24 0241 01/18/24 0524 01/19/24 0355 01/20/24 0340 01/22/24 0410 01/30/24 1057  NA 140   < > 141 141 142  --  140 137  K 3.0*   < > 3.5 3.3* 3.5  --  3.8 3.5  CL 100   < > 99 96* 100  --  107 106  CO2 28   < > 30 32 32  --  27 24  GLUCOSE 176*   < > 118* 100* 155*  --  111* 107*  BUN 16   < > 21 24* 23  --  15 17  CREATININE 1.43*   < > 1.35* 1.16* 1.10* 0.98 0.76 0.69  CALCIUM  8.9   < > 8.6* 8.5* 8.2*  --  8.4* 9.2  GFRNONAA 38*   < > 41* 49*  53* >60 >60 >60  PROT 6.6  --  6.0*  --   --   --   --  7.0  ALBUMIN 3.2*  --  2.8* 2.9* 2.6*  --   --  3.6  AST 15  --  17  --   --   --   --  16  ALT 11  --  11  --   --   --   --  15  ALKPHOS 55  --  44  --   --   --   --  58  BILITOT 1.0  --  1.0  --   --   --   --  0.7   < > = values in this interval not displayed.  Iron /TIBC/Ferritin/ %Sat    Component Value Date/Time   IRON  10 (L) 01/13/2024 0626   TIBC 230 (L) 01/13/2024 0626   FERRITIN 56 01/13/2024 0626   IRONPCTSAT 4 (L) 01/13/2024 0626   IRONPCTSAT 26 05/18/2022 1424    ECHOCARDIOGRAM LIMITED Result Date: 01/15/2024    ECHOCARDIOGRAM LIMITED REPORT   Patient Name:   EDANA AGUADO Date of Exam: 01/15/2024 Medical Rec #:  [id number]       Height:       63.0 in Accession #:    [id number]      Weight:       185.3 lb Date of Birth:  [birth year]       BSA:          1.872 m Patient Age:    73 years        BP:           144/88 mmHg Patient Gender: F  HR:           102 bpm. Exam Location:  [facility] Procedure: Color Doppler, Cardiac Doppler, Limited Echo and Intracardiac            Opacification Agent (Both Spectral and Color Flow Doppler were            utilized during procedure). Indications:     Dyspnea R06.00  History:         Patient has prior history of Echocardiogram examinations, most                  recent 11/13/2023. Signs/Symptoms:Dyspnea.  Sonographer:     Rosina Dunk Referring Phys:  [id number] DANA G NELSON Diagnosing Phys: Keller Paterson  Sonographer Comments: Technically difficult study due to poor echo windows. Image acquisition challenging due to patient body habitus. IMPRESSIONS  1. Technically difficult study, definity  used.  2. Left ventricular ejection fraction, by estimation, is 65 to 70%. The left ventricle has normal function. The left ventricle has no regional wall motion abnormalities. There is mild left ventricular hypertrophy. Left ventricular diastolic parameters are indeterminate.  3. Right  ventricular systolic function was not well visualized. The right ventricular size is not well visualized.  4. Trivial mitral valve regurgitation. Moderate mitral annular calcification.  5. The aortic valve is tricuspid. There is mild thickening of the aortic valve. Aortic valve regurgitation is not visualized. FINDINGS  Left Ventricle: Left ventricular ejection fraction, by estimation, is 65 to 70%. The left ventricle has normal function. The left ventricle has no regional wall motion abnormalities. Definity  contrast agent was given IV to delineate the left ventricular  endocardial borders. The left ventricular internal cavity size was normal in size. There is mild left ventricular hypertrophy. Left ventricular diastolic parameters are indeterminate. Right Ventricle: The right ventricular size is not well visualized. Right vetricular wall thickness was not well visualized. Right ventricular systolic function was not well visualized. Left Atrium: Left atrial size was not well visualized. Right Atrium: Right atrial size was not well visualized. Mitral Valve: Moderate mitral annular calcification. Trivial mitral valve regurgitation. MV peak gradient, 12.7 mmHg. The mean mitral valve gradient is 6.0 mmHg. Tricuspid Valve: The tricuspid valve is normal in structure. Tricuspid valve regurgitation is mild. Aortic Valve: The aortic valve is tricuspid. There is mild thickening of the aortic valve. Aortic valve regurgitation is not visualized. Aortic valve mean gradient measures 6.0 mmHg. Aortic valve peak gradient measures 11.8 mmHg. Aortic valve area, by VTI measures 2.27 cm. Pulmonic Valve: The pulmonic valve was not well visualized. Pulmonic valve regurgitation is trivial. Aorta: The aortic root is normal in size and structure. Additional Comments: Spectral Doppler performed. Color Doppler performed.  LEFT VENTRICLE PLAX 2D LVIDd:         5.40 cm     Diastology LVIDs:         2.90 cm     LV e' medial:    7.83 cm/s LV PW:          1.20 cm     LV E/e' medial:  14.8 LV IVS:        1.10 cm     LV e' lateral:   9.41 cm/s LVOT diam:     1.80 cm     LV E/e' lateral: 12.3 LV SV:         57 LV SV Index:   30 LVOT Area:     2.54 cm  LV Volumes (MOD) LV vol d, MOD A2C: 23.4  ml LV vol d, MOD A4C: 38.1 ml LV vol s, MOD A2C: 4.9 ml LV vol s, MOD A4C: 15.1 ml LV SV MOD A2C:     18.6 ml LV SV MOD A4C:     38.1 ml LV SV MOD BP:      21.3 ml RIGHT VENTRICLE RV S prime:     8.38 cm/s TAPSE (M-mode): 1.8 cm LEFT ATRIUM           Index        RIGHT ATRIUM           Index LA diam:      5.00 cm 2.67 cm/m   RA Area:     15.20 cm LA Vol (A4C): 50.8 ml 27.14 ml/m  RA Volume:   36.00 ml  19.23 ml/m  AORTIC VALVE                     PULMONIC VALVE AV Area (Vmax):    2.26 cm      PV Vmax:        1.63 m/s AV Area (Vmean):   2.24 cm      PV Vmean:       118.000 cm/s AV Area (VTI):     2.27 cm      PV VTI:         0.268 m AV Vmax:           172.00 cm/s   PV Peak grad:   10.6 mmHg AV Vmean:          116.000 cm/s  PV Mean grad:   6.0 mmHg AV VTI:            0.251 m       RVOT Peak grad: 3 mmHg AV Peak Grad:      11.8 mmHg AV Mean Grad:      6.0 mmHg LVOT Vmax:         153.00 cm/s LVOT Vmean:        102.000 cm/s LVOT VTI:          0.224 m LVOT/AV VTI ratio: 0.89  AORTA Ao Root diam: 3.10 cm Ao Asc diam:  3.00 cm MITRAL VALVE                TRICUSPID VALVE MV Area (PHT): 4.64 cm     TR Peak grad:   30.9 mmHg MV Area VTI:   1.72 cm     TR Mean grad:   22.0 mmHg MV Peak grad:  12.7 mmHg    TR Vmax:        278.00 cm/s MV Mean grad:  6.0 mmHg     TR Vmean:       227.0 cm/s MV Vmax:       1.78 m/s MV Vmean:      116.0 cm/s   SHUNTS MV Decel Time: 164 msec     Systemic VTI:  0.22 m MV E velocity: 115.60 cm/s  Systemic Diam: 1.80 cm MV A velocity: 165.00 cm/s  Pulmonic VTI:  0.148 m MV E/A ratio:  0.70 Keller Paterson Electronically signed by Keller Paterson Signature Date/Time: 01/15/2024/6:15:47 PM    Final    CT CHEST WO CONTRAST Result Date: 01/15/2024 CLINICAL  DATA:  Myelodysplastic syndrome. Acute respiratory failure. * Tracking Code: BO * EXAM: CT CHEST WITHOUT CONTRAST TECHNIQUE: Multidetector CT imaging of the chest was performed following the standard protocol without IV contrast. RADIATION DOSE REDUCTION: This exam was  performed according to the departmental dose-optimization program which includes automated exposure control, adjustment of the mA and/or kV according to patient size and/or use of iterative reconstruction technique. COMPARISON:  01/15/2024 chest radiograph and CT chest 08/15/2022 FINDINGS: Cardiovascular: Atheromatous vascular calcifications involving the thoracic aorta and left anterior descending coronary artery. Mitral valve calcification. Moderate pericardial effusion, similar to minimally increased from 08/15/2022. Mild cardiomegaly. Mediastinum/Nodes: Nasogastric tube enters the stomach. Upper normal sized paratracheal and AP window lymph nodes. Hilar adenopathy if present is obscured by motion artifact and lack of IV contrast. Lungs/Pleura: Endotracheal tube satisfactorily positioned. Below the endotracheal tube there is substantial narrowing of the tracheal lumen and proximal main stem bronchial lumens probably from a mucus plug and less likely due to tracheobronchomalacia. Differentiation is difficult due to motion artifact. Bilateral airspace opacities with air bronchograms in all lobes of the lung but with consolidation greatest in the lung bases. Vertical gradient suggests at least a component of acute pulmonary edema, potentially superimposed on aspiration pneumonitis. Upper Abdomen: Lateral segment left hepatic lobe cyst not changed from 08/25/2021 MRI. Abdominal aortic atherosclerosis. Benign peripelvic renal cysts warrant no further imaging workup. Moderately dilated fluid-filled colon. Musculoskeletal: Lower cervical plate and screw fixator. Thoracic spondylosis. IMPRESSION: 1. Below the endotracheal tube tip, there is substantial  narrowing of the tracheal lumen and proximal main stem bronchial lumens probably from a mucus plug and less likely due to tracheobronchomalacia. Consider deep suctioning attempt. 2. Bilateral airspace opacities with air bronchograms in all lobes of the lung but with consolidation greatest in the lung bases. Vertical gradient suggests at least a component of acute pulmonary edema, potentially superimposed on aspiration pneumonitis. 3. Moderate pericardial effusion, similar to minimally increased from 08/15/2022. 4. Mild cardiomegaly. 5. Moderately dilated fluid-filled colon. 6.  Aortic Atherosclerosis (ICD10-I70.0). Electronically Signed   By: Ryan Salvage M.D.   On: 01/15/2024 10:42   DG Abd 1 View Result Date: 01/15/2024 EXAM: 1 VIEW XRAY OF THE ABDOMEN 01/15/2024 06:01:46 AM COMPARISON: 01/14/2024 CLINICAL HISTORY: Encounter for nasogastric (NG) tube placement [id number]. POST INTUBATION ET TUBE/OG TUBE PLACEMENT. FINDINGS: LINES, TUBES AND DEVICES: Enteric tube in place with tip and side port overlying the expected region of the gastric lumen. Right mainstem bronchus intubation. BOWEL: Gaseous distension of the transverse colon. SOFT TISSUES: No opaque urinary calculi. BONES: No acute osseous abnormality. IMPRESSION: 1. Enteric tube in place with tip and side port overlying the expected region of the gastric lumen. 2. Right mainstem bronchus intubation. Electronically signed by: Waddell Calk MD 01/15/2024 06:07 AM EDT RP Workstation: [id number]   Portable Chest x-ray Result Date: 01/15/2024 EXAM: 1 VIEW XRAY OF THE CHEST 01/15/2024 06:01:46 AM COMPARISON: 01/15/2024 02:47 AM CLINICAL HISTORY: [id number] Endotracheal tube present [id number]. POST INTUBATION ET TUBE/OG TUBE PLACEMENT FINDINGS: LINES, TUBES AND DEVICES: Interval placement of ET tube with tip in the right mainstem bronchus. Enteric tube tip and sideboard are well below the GE junction. LUNGS AND PLEURA: Persistent and progressive bilateral mid and  lower lung zone opacities. HEART AND MEDIASTINUM: Stable cardiac enlargement. BONES AND SOFT TISSUES: No acute osseous abnormality. IMPRESSION: 1. Findings consistent with right mainstem endotracheal intubation. 2. Enteric tube tip and side-port are below the GE junction. 3. Persistent and progressive bilateral mid and lower lung zone opacities. 4. Stable cardiac enlargement. 5. The urgent finding will be called to the ordering provider by the [facility] (PRAs) and documented in the [facility] dashboard. Electronically signed by: Waddell Calk MD 01/15/2024 06:05 AM EDT RP Workstation: [id number]  DG Chest 1 View Result Date: 01/15/2024 EXAM: 1 VIEW XRAY OF THE CHEST 01/15/2024 03:05:25 AM COMPARISON: 01/12/2024 CLINICAL HISTORY: Dyspnea FINDINGS: LUNGS AND PLEURA: Increased interstitial markings, favoring mild-to-moderate interstitial edema, right perihilar predominant. No focal pulmonary opacity. No pleural effusion. No pneumothorax. HEART AND MEDIASTINUM: Cardiomegaly, unchanged. No acute abnormality of the mediastinal silhouette. BONES AND SOFT TISSUES: Cervical fusion hardware. No acute osseous abnormality. IMPRESSION: 1. New mild-to-moderate interstitial edema. 2. Cardiomegaly. Electronically signed by: Pinkie Pebbles MD 01/15/2024 03:12 AM EDT RP Workstation: [id number]   DG Abd 1 View Result Date: 01/14/2024 CLINICAL DATA:  Abdominal pain EXAM: ABDOMEN - 1 VIEW COMPARISON:  CT 01/12/2024 FINDINGS: Gaseous distension of bowel centrally in the abdomen felt to most likely reflect the cecum. No small bowel distention. No organomegaly or free air. No suspicious calcification. IMPRESSION: Nonspecific, nonobstructive bowel gas pattern. Mild gaseous distention of the cecum. Electronically Signed   By: Franky Crease M.D.   On: 01/14/2024 17:06   US  Venous Img Lower Bilateral (DVT) Result Date: 01/13/2024 CLINICAL DATA:  73 year old female with lower extremity edema. EXAM: BILATERAL LOWER  EXTREMITY VENOUS DOPPLER ULTRASOUND TECHNIQUE: Gray-scale sonography with compression, as well as color and duplex ultrasound, were performed to evaluate the deep venous system(s) from the level of the common femoral vein through the popliteal and proximal calf veins. COMPARISON:  CT Abdomen and Pelvis with contrast 01/09/2024. FINDINGS: VENOUS Normal compressibility of the bilateral common femoral, superficial femoral, and popliteal veins, as well as the visualized calf veins. Visualized portions of the bilateral profunda femoral vein and great saphenous vein unremarkable. No filling defects to suggest DVT on grayscale or color Doppler imaging. Doppler waveforms show normal direction of venous flow, normal respiratory plasticity and response to augmentation. OTHER None. Limitations: none IMPRESSION: No evidence of bilateral lower extremity deep venous thrombosis. Electronically Signed   By: VEAR Hurst M.D.   On: 01/13/2024 03:58   DG Chest Portable 1 View Result Date: 01/12/2024 CLINICAL DATA:  SOB, abd pain EXAM: DG CHEST 1V PORT COMPARISON:  September 26, 2023 FINDINGS: No focal airspace consolidation, pleural effusion, or pneumothorax. Moderate cardiomegaly. Aortic atherosclerosis. No acute fracture or destructive lesions. Multilevel thoracic osteophytosis. Cervical fusion hardware. IMPRESSION: Cardiomegaly.  No pneumonia or pulmonary edema. Electronically Signed   By: Rogelia Myers M.D.   On: 01/12/2024 14:54   CT ABDOMEN PELVIS W CONTRAST Result Date: 01/09/2024 CLINICAL DATA:  Left lower quadrant pain. EXAM: CT ABDOMEN AND PELVIS WITH CONTRAST TECHNIQUE: Multidetector CT imaging of the abdomen and pelvis was performed using the standard protocol following bolus administration of intravenous contrast. RADIATION DOSE REDUCTION: This exam was performed according to the departmental dose-optimization program which includes automated exposure control, adjustment of the mA and/or kV according to patient size and/or  use of iterative reconstruction technique. CONTRAST:  OMNIPAQUE  IOHEXOL  300 MG/ML  SOLN COMPARISON:  09/04/2023 FINDINGS: Lower Chest: No acute findings. Hepatobiliary: Stable small cyst in left hepatic lobe. No suspicious hepatic masses identified. Gallbladder is unremarkable. No evidence of biliary ductal dilatation. Pancreas:  No mass or inflammatory changes. Spleen: Within normal limits in size and appearance. Adrenals/Urinary Tract: No suspicious masses identified. No evidence of ureteral calculi or hydronephrosis. Unremarkable unopacified urinary bladder. Stomach/Bowel: Mild wall thickening is seen involving the rectosigmoid colon with adjacent soft tissue stranding, which is new since previous study. This is consistent with mild proctocolitis, although no definite diverticular disease is seen. No No evidence of perforation or abscess. Vascular/Lymphatic: No pathologically enlarged lymph nodes. No  acute vascular findings. Reproductive:  No mass or other significant abnormality. Other:  None. Musculoskeletal:  No suspicious bone lesions identified. IMPRESSION: Mild proctocolitis involving the rectum and distal sigmoid colon. No evidence of abscess or other complication. Electronically Signed   By: Norleen DELENA Kil M.D.   On: 01/09/2024 18:32   Assessment & Plan:   # Proctosigmoiditis with acute gastrointestinal bleeding and iron  deficiency anemia Recent hospitalization for infectious colitis with gastrointestinal bleeding led to significant blood loss and iron  deficiency anemia. Hemoglobin improved from 7.4 to 10.2 after IV iron . Infection confirmed via CT scan, with bleeding likely due to colitis. No current black or tarry stools, indicating improvement. - Continue Augmentin  and complete the course. - Monitor for recurrence of symptoms such as black tarry stools or abdominal pain. - hemoglobin improving to 10.2. s/p IV iron   - Reassess iron  stores in future visits to determine the need for  additional IV iron . - Delay colonoscopy until she is stronger and the infection is resolved.  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  Hemoglobin around 11-12.  Platelets 1 27-1 40s.  Normal white count.  December 2023 bone marrow biopsy revealed hypercellular bone marrow for age and dyspoietic changes.  No blasts.  Cytogenetics/FISH positive for 5 q. deletion.  August 2025 hemoglobin 9.5, platelets greater than 100.  White count normal.  Symptomatic with worsening fatigue.  Started Revlimid  10 mg 3 weeks on 1 week off along with aspirin  81 mg daily.  Tolerated well but unclear if her recent colitis is related to Revlimid  or not.  Additionally, she experienced adverse effects including blurred vision.  She had completed her first cycle when symptoms developed.  Revlimid  now on hold. -I reviewed her case with Dr. Rennie today who recommends continuing to hold Revlimid  and will reevaluate in 4 to 6 weeks. - May consider dose adjustment of the Revlimid  in the future.  Plan to discuss with Dr. Rennie at her next visit.  # Respiratory Failure requiring intubation Secondary to aspiration and aspiration pneumonia of bowel prep. Required mechanical intubation then extubated to HFNC and alternating bipap. Weaned and discharged on room air.  - monitor.   # Weight loss - Monitor. Encouraged protein rich foods for recovery.   # History of B12 deficiency-on B12 supplementation.  2024 elevated B12.  Continue B12 every other day.  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  On aspirin  81 mg a day.   - Okay to restart.  Disposition: 4-6 weeks- lab (CBC, CMP, ferritin, iron  studies), Dr Rennie- la  No problem-specific Assessment & Plan notes found for this encounter.  All questions were answered. The patient knows to call the clinic with any problems, questions or concerns.     Tinnie KANDICE Dawn, NP 01/30/2024

## 2024-01-31 ENCOUNTER — Telehealth: Payer: Self-pay | Admitting: *Deleted

## 2024-01-31 NOTE — Telephone Encounter (Signed)
 Hold Revlimid  Refills at this time. Will re evaluate in 4-6 weeks.

## 2024-02-01 ENCOUNTER — Encounter: Payer: Self-pay | Admitting: Internal Medicine

## 2024-02-01 ENCOUNTER — Ambulatory Visit (INDEPENDENT_AMBULATORY_CARE_PROVIDER_SITE_OTHER): Admitting: Internal Medicine

## 2024-02-01 VITALS — BP 122/60 | HR 72 | Ht 63.0 in | Wt 175.8 lb

## 2024-02-01 DIAGNOSIS — Z8709 Personal history of other diseases of the respiratory system: Secondary | ICD-10-CM | POA: Diagnosis not present

## 2024-02-01 DIAGNOSIS — Z09 Encounter for follow-up examination after completed treatment for conditions other than malignant neoplasm: Secondary | ICD-10-CM | POA: Diagnosis not present

## 2024-02-01 DIAGNOSIS — J690 Pneumonitis due to inhalation of food and vomit: Secondary | ICD-10-CM

## 2024-02-01 DIAGNOSIS — J479 Bronchiectasis, uncomplicated: Secondary | ICD-10-CM | POA: Diagnosis not present

## 2024-02-01 DIAGNOSIS — Z23 Encounter for immunization: Secondary | ICD-10-CM

## 2024-02-01 DIAGNOSIS — K922 Gastrointestinal hemorrhage, unspecified: Secondary | ICD-10-CM

## 2024-02-01 DIAGNOSIS — N3281 Overactive bladder: Secondary | ICD-10-CM

## 2024-02-01 DIAGNOSIS — E785 Hyperlipidemia, unspecified: Secondary | ICD-10-CM

## 2024-02-01 MED ORDER — ROSUVASTATIN CALCIUM 10 MG PO TABS: 10.0000 mg | ORAL_TABLET | Freq: Every day | ORAL | 3 refills | Status: AC

## 2024-02-01 MED ORDER — OXYBUTYNIN CHLORIDE ER 15 MG PO TB24: 15.0000 mg | ORAL_TABLET | Freq: Every day | ORAL | 2 refills | Status: AC

## 2024-02-01 NOTE — Progress Notes (Signed)
 Subjective:  Patient ID: Sarah [name], female    DOB: 1950-10-09  Age: 73 y.o. MRN: [id number]  CC: The primary encounter diagnosis was Lower GI bleed. Diagnoses of Hyperlipidemia, unspecified hyperlipidemia type, Need for influenza vaccination, Aspiration pneumonia of both lungs due to vomit, unspecified part of lung (HCC), Overactive bladder, Hospital discharge follow-up, History of acute respiratory failure, and Bronchiectasis without complication (HCC) were also pertinent to this visit.   HPI Sarah [name] presents for  Chief Complaint  Patient presents with   Hospitalization Follow-up    [name] is  a 73 yr old female with MDS, anemia and COPD who was admitted to [facility] ER on 01/12/2024 with a lower GI bleed   after failing outpatient treatment of colitis  prescribed on Sept 16 during ER visit at which time she declined admission.    During hospitalization she developed  hypoxic resp failure secondary to aspiration pneumonia during bowel prep (gatorade and miralax ) induced nausea and vomiting,  requiring transfer to ICU and mechanical intubation.  She was extubated on high flow nasal cannula and BiPAP.  She eventually was able to undergo EGD on 9/29 which showed normal esophagus and duodenum and gastric polyps in the stomach.  No signs of bleeding. Colonoscopy was deferred due to need for additional prep  (patient declined).  Hemoglobin reached a nadir of 7.4 and she was given IV iron .  She was off oxygen prior to discharge.  Inpatient rehab was denied by insurance and she ultimately was discharged homeon 01/22/2024 to home with home health.   Since discharge she has discontinued Revlimid  because she attributes her illness to use of the medication . She saw oncology on Tuesday and hgb had improved  Lab Results  Component Value Date   WBC 5.3 01/30/2024   HGB 10.2 (L) 01/30/2024   HCT 31.8 (L) 01/30/2024   MCV 107.8 (H) 01/30/2024   PLT 234 01/30/2024     Medications reviewed:  d/c  summary  her medication for bronchiectasis was stopped,  for unclear reasons,  solfenacin was stopped she has been having nocturia and urge incontinence she is requeitng retur to oxybutynin    and is  Not constipated    Outpatient Medications Prior to Visit  Medication Sig Dispense Refill   albuterol  (VENTOLIN  HFA) 108 (90 Base) MCG/ACT inhaler Inhale 1-2 puffs into the lungs every 6 (six) hours as needed for wheezing or shortness of breath. 8.5 g 11   amLODipine  (NORVASC ) 5 MG tablet Take 0.5 tablets (2.5 mg total) by mouth daily.     Calcium  Citrate-Vitamin D  (CALCIUM  CITRATE + D PO) Take 1 tablet by mouth 2 (two) times daily.     citalopram  (CELEXA ) 20 MG tablet TAKE 1 TABLET BY MOUTH DAILY 100 tablet 2   ferrous sulfate  324 MG TBEC Take 1 tablet (324 mg total) by mouth daily with breakfast. 65 mg of elemental iron      fexofenadine (ALLEGRA) 180 MG tablet Take 180 mg by mouth daily.     Fluticasone -Umeclidin-Vilant (TRELEGY ELLIPTA ) 200-62.5-25 MCG/ACT AEPB Inhale 1 puff into the lungs daily. 28 each 0   gabapentin  (NEURONTIN ) 100 MG capsule TAKE 1 CAPSULE BY MOUTH 3 TIMES  DAILY 300 capsule 2   levothyroxine  (SYNTHROID ) 50 MCG tablet TAKE 1 TABLET BY MOUTH DAILY ON  AN EMPTY STOMACH WITH A GLASS OF WATER  AT LEAST 30 TO 60 MINUTES  BEFORE BREAKFAST 100 tablet 2   pantoprazole  (PROTONIX ) 40 MG tablet Take 1 tablet (40 mg total)  by mouth daily. 30 tablet 0   traZODone  (DESYREL ) 50 MG tablet TAKE 1/2 TO 1 TABLET BY MOUTH AT BEDTIME AS NEEDED FOR SLEEP 90 tablet 3   vitamin B-12 (CYANOCOBALAMIN ) 1000 MCG tablet Take 1,000 mcg by mouth daily.     rosuvastatin  (CRESTOR ) 10 MG tablet TAKE ONE TABLET BY MOUTH ONCE DAILY 100 tablet 1   No facility-administered medications prior to visit.    Review of Systems;  Patient denies headache, fevers, malaise, unintentional weight loss, skin rash, eye pain, sinus congestion and sinus pain, sore throat, dysphagia,  hemoptysis , cough, dyspnea, wheezing, chest  pain, palpitations, orthopnea, edema, abdominal pain, nausea, melena, diarrhea, constipation, flank pain, dysuria, hematuria, urinary  Frequency, nocturia, numbness, tingling, seizures,  Focal weakness, Loss of consciousness,  Tremor, insomnia, depression, anxiety, and suicidal ideation.      Objective:  BP 122/60   Pulse 72   Ht 5' 3 (1.6 m)   Wt 175 lb 12.8 oz (79.7 kg)   SpO2 92%   BMI 31.14 kg/m   BP Readings from Last 3 Encounters:  02/01/24 122/60  01/30/24 136/64  01/22/24 (!) 115/54    Wt Readings from Last 3 Encounters:  02/01/24 175 lb 12.8 oz (79.7 kg)  01/30/24 174 lb (78.9 kg)  01/22/24 180 lb 1.9 oz (81.7 kg)    Physical Exam Vitals reviewed.  Constitutional:      General: She is not in acute distress.    Appearance: Normal appearance. She is normal weight. She is not ill-appearing, toxic-appearing or diaphoretic.  HENT:     Head: Normocephalic.  Eyes:     General: No scleral icterus.       Right eye: No discharge.        Left eye: No discharge.     Conjunctiva/sclera: Conjunctivae normal.  Cardiovascular:     Rate and Rhythm: Normal rate and regular rhythm.     Heart sounds: Normal heart sounds.  Pulmonary:     Effort: Pulmonary effort is normal. No respiratory distress.     Breath sounds: Normal breath sounds.  Musculoskeletal:        General: Normal range of motion.  Skin:    General: Skin is warm and dry.  Neurological:     General: No focal deficit present.     Mental Status: She is alert and oriented to person, place, and time. Mental status is at baseline.  Psychiatric:        Mood and Affect: Mood normal.        Behavior: Behavior normal.        Thought Content: Thought content normal.        Judgment: Judgment normal.     Lab Results  Component Value Date   HGBA1C 4.8 01/15/2024   HGBA1C 5.9 11/21/2022   HGBA1C 6.3 (H) 08/15/2022    Lab Results  Component Value Date   CREATININE 0.69 01/30/2024   CREATININE 0.76 01/22/2024    CREATININE 0.98 01/20/2024    Lab Results  Component Value Date   WBC 5.3 01/30/2024   HGB 10.2 (L) 01/30/2024   HCT 31.8 (L) 01/30/2024   PLT 234 01/30/2024   GLUCOSE 107 (H) 01/30/2024   CHOL 152 11/21/2022   TRIG 52 01/16/2024   HDL 72.40 11/21/2022   LDLDIRECT 58.0 11/21/2022   LDLCALC 68 11/21/2022   ALT 15 01/30/2024   AST 16 01/30/2024   NA 137 01/30/2024   K 3.5 01/30/2024   CL 106 01/30/2024  CREATININE 0.69 01/30/2024   BUN 17 01/30/2024   CO2 24 01/30/2024   TSH 1.669 01/13/2024   INR 1.1 08/16/2022   HGBA1C 4.8 01/15/2024    ECHOCARDIOGRAM LIMITED Result Date: 01/15/2024    ECHOCARDIOGRAM LIMITED REPORT   Patient Name:   Sarah [name] Date of Exam: 01/15/2024 Medical Rec #:  [id number]       Height:       63.0 in Accession #:    [id number]      Weight:       185.3 lb Date of Birth:  01-01-1951       BSA:          1.872 m Patient Age:    73 years        BP:           144/88 mmHg Patient Gender: F               HR:           102 bpm. Exam Location:  [facility] Procedure: Color Doppler, Cardiac Doppler, Limited Echo and Intracardiac            Opacification Agent (Both Spectral and Color Flow Doppler were            utilized during procedure). Indications:     Dyspnea R06.00  History:         Patient has prior history of Echocardiogram examinations, most                  recent 11/13/2023. Signs/Symptoms:Dyspnea.  Sonographer:     Rosina Dunk Referring Phys:  [id number] DANA G NELSON Diagnosing Phys: Keller Paterson  Sonographer Comments: Technically difficult study due to poor echo windows. Image acquisition challenging due to patient body habitus. IMPRESSIONS  1. Technically difficult study, definity  used.  2. Left ventricular ejection fraction, by estimation, is 65 to 70%. The left ventricle has normal function. The left ventricle has no regional wall motion abnormalities. There is mild left ventricular hypertrophy. Left ventricular diastolic parameters are indeterminate.  3.  Right ventricular systolic function was not well visualized. The right ventricular size is not well visualized.  4. Trivial mitral valve regurgitation. Moderate mitral annular calcification.  5. The aortic valve is tricuspid. There is mild thickening of the aortic valve. Aortic valve regurgitation is not visualized. FINDINGS  Left Ventricle: Left ventricular ejection fraction, by estimation, is 65 to 70%. The left ventricle has normal function. The left ventricle has no regional wall motion abnormalities. Definity  contrast agent was given IV to delineate the left ventricular  endocardial borders. The left ventricular internal cavity size was normal in size. There is mild left ventricular hypertrophy. Left ventricular diastolic parameters are indeterminate. Right Ventricle: The right ventricular size is not well visualized. Right vetricular wall thickness was not well visualized. Right ventricular systolic function was not well visualized. Left Atrium: Left atrial size was not well visualized. Right Atrium: Right atrial size was not well visualized. Mitral Valve: Moderate mitral annular calcification. Trivial mitral valve regurgitation. MV peak gradient, 12.7 mmHg. The mean mitral valve gradient is 6.0 mmHg. Tricuspid Valve: The tricuspid valve is normal in structure. Tricuspid valve regurgitation is mild. Aortic Valve: The aortic valve is tricuspid. There is mild thickening of the aortic valve. Aortic valve regurgitation is not visualized. Aortic valve mean gradient measures 6.0 mmHg. Aortic valve peak gradient measures 11.8 mmHg. Aortic valve area, by VTI measures 2.27 cm. Pulmonic Valve: The pulmonic valve was not well visualized.  Pulmonic valve regurgitation is trivial. Aorta: The aortic root is normal in size and structure. Additional Comments: Spectral Doppler performed. Color Doppler performed.  LEFT VENTRICLE PLAX 2D LVIDd:         5.40 cm     Diastology LVIDs:         2.90 cm     LV e' medial:    7.83 cm/s  LV PW:         1.20 cm     LV E/e' medial:  14.8 LV IVS:        1.10 cm     LV e' lateral:   9.41 cm/s LVOT diam:     1.80 cm     LV E/e' lateral: 12.3 LV SV:         57 LV SV Index:   30 LVOT Area:     2.54 cm  LV Volumes (MOD) LV vol d, MOD A2C: 23.4 ml LV vol d, MOD A4C: 38.1 ml LV vol s, MOD A2C: 4.9 ml LV vol s, MOD A4C: 15.1 ml LV SV MOD A2C:     18.6 ml LV SV MOD A4C:     38.1 ml LV SV MOD BP:      21.3 ml RIGHT VENTRICLE RV S prime:     8.38 cm/s TAPSE (M-mode): 1.8 cm LEFT ATRIUM           Index        RIGHT ATRIUM           Index LA diam:      5.00 cm 2.67 cm/m   RA Area:     15.20 cm LA Vol (A4C): 50.8 ml 27.14 ml/m  RA Volume:   36.00 ml  19.23 ml/m  AORTIC VALVE                     PULMONIC VALVE AV Area (Vmax):    2.26 cm      PV Vmax:        1.63 m/s AV Area (Vmean):   2.24 cm      PV Vmean:       118.000 cm/s AV Area (VTI):     2.27 cm      PV VTI:         0.268 m AV Vmax:           172.00 cm/s   PV Peak grad:   10.6 mmHg AV Vmean:          116.000 cm/s  PV Mean grad:   6.0 mmHg AV VTI:            0.251 m       RVOT Peak grad: 3 mmHg AV Peak Grad:      11.8 mmHg AV Mean Grad:      6.0 mmHg LVOT Vmax:         153.00 cm/s LVOT Vmean:        102.000 cm/s LVOT VTI:          0.224 m LVOT/AV VTI ratio: 0.89  AORTA Ao Root diam: 3.10 cm Ao Asc diam:  3.00 cm MITRAL VALVE                TRICUSPID VALVE MV Area (PHT): 4.64 cm     TR Peak grad:   30.9 mmHg MV Area VTI:   1.72 cm     TR Mean grad:   22.0 mmHg MV Peak grad:  12.7 mmHg  TR Vmax:        278.00 cm/s MV Mean grad:  6.0 mmHg     TR Vmean:       227.0 cm/s MV Vmax:       1.78 m/s MV Vmean:      116.0 cm/s   SHUNTS MV Decel Time: 164 msec     Systemic VTI:  0.22 m MV E velocity: 115.60 cm/s  Systemic Diam: 1.80 cm MV A velocity: 165.00 cm/s  Pulmonic VTI:  0.148 m MV E/A ratio:  0.70 Keller Paterson Electronically signed by Keller Paterson Signature Date/Time: 01/15/2024/6:15:47 PM    Final    CT CHEST WO CONTRAST Result Date:  01/15/2024 CLINICAL DATA:  Myelodysplastic syndrome. Acute respiratory failure. * Tracking Code: BO * EXAM: CT CHEST WITHOUT CONTRAST TECHNIQUE: Multidetector CT imaging of the chest was performed following the standard protocol without IV contrast. RADIATION DOSE REDUCTION: This exam was performed according to the departmental dose-optimization program which includes automated exposure control, adjustment of the mA and/or kV according to patient size and/or use of iterative reconstruction technique. COMPARISON:  01/15/2024 chest radiograph and CT chest 08/15/2022 FINDINGS: Cardiovascular: Atheromatous vascular calcifications involving the thoracic aorta and left anterior descending coronary artery. Mitral valve calcification. Moderate pericardial effusion, similar to minimally increased from 08/15/2022. Mild cardiomegaly. Mediastinum/Nodes: Nasogastric tube enters the stomach. Upper normal sized paratracheal and AP window lymph nodes. Hilar adenopathy if present is obscured by motion artifact and lack of IV contrast. Lungs/Pleura: Endotracheal tube satisfactorily positioned. Below the endotracheal tube there is substantial narrowing of the tracheal lumen and proximal main stem bronchial lumens probably from a mucus plug and less likely due to tracheobronchomalacia. Differentiation is difficult due to motion artifact. Bilateral airspace opacities with air bronchograms in all lobes of the lung but with consolidation greatest in the lung bases. Vertical gradient suggests at least a component of acute pulmonary edema, potentially superimposed on aspiration pneumonitis. Upper Abdomen: Lateral segment left hepatic lobe cyst not changed from 08/25/2021 MRI. Abdominal aortic atherosclerosis. Benign peripelvic renal cysts warrant no further imaging workup. Moderately dilated fluid-filled colon. Musculoskeletal: Lower cervical plate and screw fixator. Thoracic spondylosis. IMPRESSION: 1. Below the endotracheal tube tip, there is  substantial narrowing of the tracheal lumen and proximal main stem bronchial lumens probably from a mucus plug and less likely due to tracheobronchomalacia. Consider deep suctioning attempt. 2. Bilateral airspace opacities with air bronchograms in all lobes of the lung but with consolidation greatest in the lung bases. Vertical gradient suggests at least a component of acute pulmonary edema, potentially superimposed on aspiration pneumonitis. 3. Moderate pericardial effusion, similar to minimally increased from 08/15/2022. 4. Mild cardiomegaly. 5. Moderately dilated fluid-filled colon. 6.  Aortic Atherosclerosis (ICD10-I70.0). Electronically Signed   By: Ryan Salvage M.D.   On: 01/15/2024 10:42   DG Abd 1 View Result Date: 01/15/2024 EXAM: 1 VIEW XRAY OF THE ABDOMEN 01/15/2024 06:01:46 AM COMPARISON: 01/14/2024 CLINICAL HISTORY: Encounter for nasogastric (NG) tube placement [id number]. POST INTUBATION ET TUBE/OG TUBE PLACEMENT. FINDINGS: LINES, TUBES AND DEVICES: Enteric tube in place with tip and side port overlying the expected region of the gastric lumen. Right mainstem bronchus intubation. BOWEL: Gaseous distension of the transverse colon. SOFT TISSUES: No opaque urinary calculi. BONES: No acute osseous abnormality. IMPRESSION: 1. Enteric tube in place with tip and side port overlying the expected region of the gastric lumen. 2. Right mainstem bronchus intubation. Electronically signed by: Waddell Calk MD 01/15/2024 06:07 AM EDT RP Workstation: [id number]   Portable  Chest x-ray Result Date: 01/15/2024 EXAM: 1 VIEW XRAY OF THE CHEST 01/15/2024 06:01:46 AM COMPARISON: 01/15/2024 02:47 AM CLINICAL HISTORY: [id number] Endotracheal tube present [id number]. POST INTUBATION ET TUBE/OG TUBE PLACEMENT FINDINGS: LINES, TUBES AND DEVICES: Interval placement of ET tube with tip in the right mainstem bronchus. Enteric tube tip and sideboard are well below the GE junction. LUNGS AND PLEURA: Persistent and progressive  bilateral mid and lower lung zone opacities. HEART AND MEDIASTINUM: Stable cardiac enlargement. BONES AND SOFT TISSUES: No acute osseous abnormality. IMPRESSION: 1. Findings consistent with right mainstem endotracheal intubation. 2. Enteric tube tip and side-port are below the GE junction. 3. Persistent and progressive bilateral mid and lower lung zone opacities. 4. Stable cardiac enlargement. 5. The urgent finding will be called to the ordering provider by the Professional Radiology Assistants (PRAs) and documented in the [facility] dashboard. Electronically signed by: Waddell Calk MD 01/15/2024 06:05 AM EDT RP Workstation: [id number]   DG Chest 1 View Result Date: 01/15/2024 EXAM: 1 VIEW XRAY OF THE CHEST 01/15/2024 03:05:25 AM COMPARISON: 01/12/2024 CLINICAL HISTORY: Dyspnea FINDINGS: LUNGS AND PLEURA: Increased interstitial markings, favoring mild-to-moderate interstitial edema, right perihilar predominant. No focal pulmonary opacity. No pleural effusion. No pneumothorax. HEART AND MEDIASTINUM: Cardiomegaly, unchanged. No acute abnormality of the mediastinal silhouette. BONES AND SOFT TISSUES: Cervical fusion hardware. No acute osseous abnormality. IMPRESSION: 1. New mild-to-moderate interstitial edema. 2. Cardiomegaly. Electronically signed by: Pinkie Pebbles MD 01/15/2024 03:12 AM EDT RP Workstation: [id number]    Assessment & Plan:  .Lower GI bleed -     Ambulatory referral to Gastroenterology  Hyperlipidemia, unspecified hyperlipidemia type -     Rosuvastatin  Calcium ; Take 1 tablet (10 mg total) by mouth daily.  Dispense: 100 tablet; Refill: 3  Need for influenza vaccination -     Flu vaccine HIGH DOSE PF(Fluzone Trivalent)  Aspiration pneumonia of both lungs due to vomit, unspecified part of lung (HCC) -     DG Chest 2 View; Future  Overactive bladder Assessment & Plan: She as reqeusted to resume oxybutynin  e.  Reviewed urology workup in Nov 2022 for  ultrasound sggestive of  hydronephrosis:,  no cause found , determined to be inaccurate diagnosis by urology    Hospital discharge follow-up Assessment & Plan: Patient is stable post discharge and has several questions  about discharge plans  that were answered at the visit today for hospital follow up. All labs , imaging studies and progress notes from admission were reviewed with patient today   .  I have resumed MWF antibiotics for bronchiectasis and discussed with Dr Tamea   History of acute respiratory failure Assessment & Plan: Recent episode occurring after aspirating vomitus during colon preparation    Bronchiectasis without complication [facility]) Assessment & Plan: Reviewed  the diagnosis to patient and reinforced need to continue azithromycin  MWF per Dr Tamea   Other orders -     oxyBUTYnin  Chloride ER; Take 1 tablet (15 mg total) by mouth at bedtime.  Dispense: 30 tablet; Refill: 2     Follow-up: No follow-ups on file.   Verneita LITTIE Kettering, MD

## 2024-02-01 NOTE — Patient Instructions (Addendum)
 You should resume  azithromycin    Mon Wednesday and Fridays    Use your nausea  pill if you need it   Resume oxybutynin  for bladder incontinence and add colace if constipation occurs.    Flu vaccine given today  Referral to Dr Aundria in progress.  Your chest x ray will need to be repeated and CLEAR prior to colonoscopy so return in 4 weeks for a chest x ray  Do NOT increase your iron  beyond one tablet daily

## 2024-02-03 ENCOUNTER — Encounter: Payer: Self-pay | Admitting: Internal Medicine

## 2024-02-03 NOTE — Assessment & Plan Note (Signed)
 Recent episode occurring after aspirating vomitus during colon preparation

## 2024-02-03 NOTE — Assessment & Plan Note (Addendum)
 Reviewed  the diagnosis to patient and reinforced need to continue azithromycin  MWF per Dr Tamea

## 2024-02-03 NOTE — Assessment & Plan Note (Signed)
 Patient is stable post discharge and has several questions  about discharge plans  that were answered at the visit today for hospital follow up. All labs , imaging studies and progress notes from admission were reviewed with patient today   .  I have resumed MWF antibiotics for bronchiectasis and discussed with Dr Tamea

## 2024-02-03 NOTE — Assessment & Plan Note (Signed)
 She as reqeusted to resume oxybutynin  e.  Reviewed urology workup in Nov 2022 for  ultrasound sggestive of hydronephrosis:,  no cause found , determined to be inaccurate diagnosis by urology

## 2024-02-05 ENCOUNTER — Ambulatory Visit: Admitting: Pulmonary Disease

## 2024-02-05 ENCOUNTER — Encounter: Payer: Self-pay | Admitting: Pulmonary Disease

## 2024-02-05 VITALS — BP 116/60 | HR 70 | Temp 97.7°F | Ht 63.0 in | Wt 174.2 lb

## 2024-02-05 DIAGNOSIS — J479 Bronchiectasis, uncomplicated: Secondary | ICD-10-CM

## 2024-02-05 DIAGNOSIS — K449 Diaphragmatic hernia without obstruction or gangrene: Secondary | ICD-10-CM | POA: Diagnosis not present

## 2024-02-05 DIAGNOSIS — J449 Chronic obstructive pulmonary disease, unspecified: Secondary | ICD-10-CM

## 2024-02-05 DIAGNOSIS — R600 Localized edema: Secondary | ICD-10-CM

## 2024-02-05 DIAGNOSIS — G4733 Obstructive sleep apnea (adult) (pediatric): Secondary | ICD-10-CM

## 2024-02-05 DIAGNOSIS — D46Z Other myelodysplastic syndromes: Secondary | ICD-10-CM

## 2024-02-05 DIAGNOSIS — J690 Pneumonitis due to inhalation of food and vomit: Secondary | ICD-10-CM | POA: Diagnosis not present

## 2024-02-05 DIAGNOSIS — J4540 Moderate persistent asthma, uncomplicated: Secondary | ICD-10-CM

## 2024-02-05 NOTE — Patient Instructions (Signed)
 VISIT SUMMARY:  Today, we discussed your ongoing health issues, including complications from myelodysplastic syndrome, recent pneumonia, and other related conditions. We reviewed your current medications and made plans for further tests and follow-ups to manage your symptoms and improve your overall health.  YOUR PLAN:  -ASPIRATION PNEUMONIA: Aspiration pneumonia occurs when food, liquid, or vomit is breathed into the lungs, causing an infection. We will order a detailed lung scan before your next visit to monitor your condition.  - MODERATE PERSISTENT ASTHMA: Asthma is a  lung disease that makes it hard to breathe. You should continue using your Trelegy inhaler and keep your rescue inhaler available for sudden breathing problems.  -OBSTRUCTIVE SLEEP APNEA: Obstructive sleep apnea is a condition where your breathing stops and starts during sleep. Continue using your CPAP machine as prescribed to help manage this condition.  -HIATAL HERNIA: A hiatal hernia occurs when part of your stomach pushes up through your diaphragm. This can increase your risk of aspiration. Currently, there are no plans for esophageal dilation.  -NON-INFECTIOUS COLITIS: Non-infectious colitis is inflammation of the colon not caused by infection. You can continue your current medications as infectious colitis has been ruled out.  -MYELODYSPLASTIC SYNDROME: Myelodysplastic syndrome is a group of disorders caused by poorly formed or dysfunctional blood cells. You have decided not to resume Revlimid  due to side effects. We will discuss potential medication changes with your oncology team.   INSTRUCTIONS:  Please follow up with your chemotherapy doctor, Dr. Rennie, and his assistant on November 7th. Additionally, ensure you get the detailed lung scan before your next visit.

## 2024-02-05 NOTE — Progress Notes (Addendum)
 Subjective:    Patient ID: Sarah [name], female    DOB: [birth year], 73 y.o.   MRN: [id number]  Patient Care Team: [name], MD as PCP - General (Internal Medicine) [name], MD as PCP - Cardiology (Cardiology) [name], MD (Internal Medicine) [name], [name], MD (General Surgery) [name], MD as Consulting Physician (Oncology) Aundria Ladell POUR, MD as Consulting Physician (Gastroenterology)  Chief Complaint  Patient presents with   Medical Management of Chronic Issues    No cough, shortness of breath or wheezing.     BACKGROUND/INTERVAL:Patient is a very complex 73 year old lifelong never smoker with a history of moderate persistent asthma and bronchiectasis who presents today as a scheduled visit.  Her management is complicated by the presence of myelodysplastic syndrome/CML and severe anemia.  I last saw the patient on 08 December 2023.  She has been maintained on Trelegy Ellipta  200, 1 inhalation daily for her asthma.  She is also on rescue albuterol .  She is on CPAP for obstructive sleep apnea.  Since her last visit she was admitted to [facility] on 12 January 2024 with severe colitis.  During workup the patient was given bowel prep for colonoscopy and vomited and aspirated.  She had to be intubated and had to have a bronchoscopy to clear her airway.  Bronchoscopy also revealed dynamic airway collapse at the level of the distal trachea, left and right main bronchi.  HPI Discussed the use of AI scribe software for clinical note transcription with the patient, who gave verbal consent to proceed.  History of Present Illness   Sarah [name] is a 73 year old female with myelodysplastic syndrome who presents for a follow-up.  She has been experiencing complications related to her myelodysplastic syndrome/CML, including an episode of colitis related to chemotherapeutic drug (Revlimid ) that led to vomiting and subsequent aspiration into her lungs,  resulting in significant shortness of breath, described as 'real, real bad.'  She was hospitalized for colitis and subsequently developed aspiration pneumonia after an episode of vomiting. She uses a CPAP machine, although on two nights, the machine was not used properly, but she was not in pain during those times. She is also using Trelegy inhaler and has a rescue inhaler available.  She has been on Revlimid  for her myelodysplastic syndrome but does not want to continue due to side effects, including leg swelling and increased shortness of breath upon exertion and the episode of colitis as noted above. She was previously taken off Azithromycin  taken on Monday, Wednesday, and Friday, but has since restarted it after consulting with Dr. Marylynn, her primary physician.   She has a history of being on a ventilator during a previous hospitalization and mentions that her hemoglobin levels are a concern. She is scheduled to see her chemotherapy doctor, Dr. Rennie, and his assistant in November.     PAP compliance report from 06 January 2019 25 through 04 February 2024 shows that usage over 30 days is at 77% with usage over 4 hours being 67%.  Pressures range between 9 to 13 cm H2O.  She is on AutoSet at 5 to 15 cm H2O with EPR level of 3.  Residual AHI is 4.5 when the device is used.  Review of Systems A 10 point review of systems was performed and it is as noted above otherwise negative.   Patient Active Problem List   Diagnosis Date Noted   Melena 01/22/2024   Hemoptysis 01/19/2024   Acute respiratory  failure with hypoxia (HCC) 01/18/2024   Aspiration pneumonia (HCC) 01/18/2024   COPD with acute exacerbation (HCC) 01/18/2024   Acute on chronic blood loss anemia 01/18/2024   AKI (acute kidney injury) 01/18/2024   Hypokalemia 01/18/2024   Hematochezia 01/18/2024   Bronchiectasis (HCC) 11/07/2023   History of acute respiratory failure 09/04/2023   Right middle lobe pneumonia 09/04/2023    History of cystitis 05/07/2023   Coronary artery disease due to calcified coronary lesion 05/05/2023   Persistent asthma with acute exacerbation 05/05/2023   Bilateral lower extremity edema 12/02/2022   OSA (obstructive sleep apnea) 09/06/2022   Hyperglycemia 08/15/2022   Snoring 06/03/2022   Hypersomnia 05/18/2022   Suprapubic cramping 05/18/2022   Overactive bladder 05/18/2022   MDS (myelodysplastic syndrome) (HCC) 04/20/2022   Tubular adenoma of colon 02/07/2022   Cervical myelopathy (HCC) 11/10/2021   Spinal stenosis, cervical region 09/15/2021   Adenomatous polyp of transverse colon    Polyp of colon    Thrombocytopenia 01/13/2021   Essential hypertension 12/20/2020   Macrocytic anemia 12/18/2020   Allergic rhinitis 12/13/2020   Fatigue 12/13/2020   Insomnia 12/13/2020   Grief reaction 08/09/2020   Cognitive complaints 08/09/2020   Neuropathy 06/14/2020   Educated about COVID-19 virus infection 04/10/2019   Prediabetes 10/13/2018   LVH (left ventricular hypertrophy) due to hypertensive disease, without heart failure 10/13/2018   Hospital discharge follow-up 07/08/2018   Hypothyroidism, unspecified 06/15/2017   Post herpetic neuralgia 06/06/2017   Encounter for preventive health examination 09/09/2014   Chronic GERD 09/09/2014   Hyperlipidemia 08/17/2014   S/P hysterectomy 04/28/2013   Initial Medicare annual wellness visit 04/28/2013   Anxiety and depression     Social History   Tobacco Use   Smoking status: Never   Smokeless tobacco: Never  Substance Use Topics   Alcohol use: No    No Known Allergies  Current Meds  Medication Sig   albuterol  (VENTOLIN  HFA) 108 (90 Base) MCG/ACT inhaler Inhale 1-2 puffs into the lungs every 6 (six) hours as needed for wheezing or shortness of breath.   amLODipine  (NORVASC ) 5 MG tablet Take 0.5 tablets (2.5 mg total) by mouth daily.   Calcium  Citrate-Vitamin D  (CALCIUM  CITRATE + D PO) Take 1 tablet by mouth 2 (two) times  daily.   citalopram  (CELEXA ) 20 MG tablet TAKE 1 TABLET BY MOUTH DAILY   ferrous sulfate  324 MG TBEC Take 1 tablet (324 mg total) by mouth daily with breakfast. 65 mg of elemental iron    fexofenadine (ALLEGRA) 180 MG tablet Take 180 mg by mouth daily.   Fluticasone -Umeclidin-Vilant (TRELEGY ELLIPTA ) 200-62.5-25 MCG/ACT AEPB Inhale 1 puff into the lungs daily.   gabapentin  (NEURONTIN ) 100 MG capsule TAKE 1 CAPSULE BY MOUTH 3 TIMES  DAILY   levothyroxine  (SYNTHROID ) 50 MCG tablet TAKE 1 TABLET BY MOUTH DAILY ON  AN EMPTY STOMACH WITH A GLASS OF WATER  AT LEAST 30 TO 60 MINUTES  BEFORE BREAKFAST   oxybutynin  (DITROPAN  XL) 15 MG 24 hr tablet Take 1 tablet (15 mg total) by mouth at bedtime.   pantoprazole  (PROTONIX ) 40 MG tablet Take 1 tablet (40 mg total) by mouth daily.   rosuvastatin  (CRESTOR ) 10 MG tablet Take 1 tablet (10 mg total) by mouth daily.   traZODone  (DESYREL ) 50 MG tablet TAKE 1/2 TO 1 TABLET BY MOUTH AT BEDTIME AS NEEDED FOR SLEEP   vitamin B-12 (CYANOCOBALAMIN ) 1000 MCG tablet Take 1,000 mcg by mouth daily.    Immunization History  Administered Date(s) Administered  sv, Bivalent, Protein Subunit Rsvpref,pf (Abrysvo) 05/26/2022   Fluad Quad(high Dose 65+) 12/06/2018   Fluad Trivalent(High Dose 65+) 01/16/2023   INFLUENZA, HIGH DOSE SEASONAL PF 04/22/2016, 06/05/2017, 02/01/2024   Influenza,inj,Quad PF,6+ Mos 04/26/2013, 01/30/2015   Influenza-Unspecified 01/24/2018, 12/06/2018, 01/02/2020, 04/02/2021, 02/03/2022   Moderna Covid-19 Fall Seasonal Vaccine 54yrs & older 02/10/2023   Moderna Covid-19 Vaccine Bivalent Booster 22yrs & up 02/05/2021, 02/03/2022   Moderna Sars-Covid-2 Vaccination 06/20/2019, 07/22/2019, 02/14/2020   PNEUMOCOCCAL CONJUGATE-20 11/09/2020   Pneumococcal Conjugate-13 09/01/2014, 01/24/2018   Pneumococcal Polysaccharide-23 04/26/2013, 02/28/2019   Tdap 12/21/2010, 06/18/2014, 05/26/2022   Zoster Recombinant(Shingrix) 07/08/2017, 09/22/2017         Objective:     BP 116/60   Pulse 70   Temp 97.7 F (36.5 C) (Temporal)   Ht 5' 3 (1.6 m)   Wt 174 lb 3.2 oz (79 kg)   SpO2 95%   BMI 30.86 kg/m   SpO2: 95 %  GENERAL: Overweight woman, no acute distress.  Fully ambulatory, no conversational dyspnea, no pallor noted today. HEAD: Normocephalic, atraumatic.  EYES: Pupils equal, round, reactive to light.  No scleral icterus.  Pale conjunctiva. MOUTH: Natural dentition present, few chipped teeth.  Oral mucosa moist. NECK: Supple. No thyromegaly. Trachea midline. No JVD.  No adenopathy. PULMONARY: Lungs sound coarse but otherwise clear to auscultation bilaterally. CARDIOVASCULAR: S1 and S2. Regular rate and rhythm.  No rubs, murmurs gallops heard. GASTROINTESTINAL: Benign. MUSCULOSKELETAL: No joint deformity, no clubbing, +1 edema.  NEUROLOGIC: No overt focal deficits. SKIN: Intact,warm,dry. PSYCH: Mood and behavior appropriate.   Assessment & Plan:     ICD-10-CM   1. Moderate persistent asthma without complication  J45.40     2. Bronchiectasis without complication (HCC)  J47.9 CT CHEST HIGH RESOLUTION    3. MDS (myelodysplastic syndrome), low grade (HCC)  D46.Z     4. OSA (obstructive sleep apnea)  G47.33     5. Aspiration pneumonia of both lower lobes due to vomit (HCC)  J69.0       Orders Placed This Encounter  Procedures   CT CHEST HIGH RESOLUTION    Standing Status:   Future    Expected Date:   02/19/2024    Expiration Date:   02/04/2025    [facility]erred [facility]   Discussion:    Aspiration pneumonia Aspiration pneumonia secondary to vomiting episodes, triggered by colonoscopy prep. Aspiration risk persists due to mechanical factors (hiatal hernia) despite up-to-date pneumonia vaccinations. - Order detailed lung scan before next visit to ensure prior pneumonia has resolved  Chronic obstructive pulmonary disease Chronic obstructive pulmonary disease managed with Trelegy inhaler.  Reports improved dyspnea. - Continue Trelegy inhaler - Ensure availability of rescue inhaler  Obstructive sleep apnea Obstructive sleep apnea managed with CPAP. Reports occasional issues with CPAP usage but overall compliance is good. - Continue CPAP therapy  Hiatal hernia/past history of esophageal stricture Hiatal hernia contributing to aspiration risk. No current plan for esophageal dilation, but she inquired about it.  Non-infectious colitis Recent episode of non-infectious colitis, possibly exacerbated by Revlimid . Infectious colitis ruled out, allowing for continuation of current medications.  Myelodysplastic syndrome Myelodysplastic syndrome was managed with Revlimid . She expressed a desire not to resume Revlimid  due to adverse effects including leg swelling and shortness of breath. Hemoglobin levels are a concern if not maintained, as discussed with her oncologist. Scheduled to see the oncologist's assistant on November 7th. - Discuss potential medication changes with oncology team  Lower extremity edema Reports  swelling in legs.  Edema not severe at present.  Monitor.  Will see the patient in follow-up in 4 to 6 weeks time call sooner should any problems arise.  Advised if symptoms do not improve or worsen, to please contact office for sooner follow up or seek emergency care.    I spent 40 minutes of dedicated to the care of this patient on the date of this encounter to include pre-visit review of records, face-to-face time with the patient discussing conditions above, post visit ordering of testing, clinical documentation with the electronic health record, making appropriate referrals as documented, and communicating necessary findings to members of the patients care team.     C. Leita Sanders, MD Advanced Bronchoscopy PCCM [facility]    *This note was generated using voice recognition software/Dragon and/or AI transcription program.  Despite best  efforts to proofread, errors can occur which can change the meaning. Any transcriptional errors that result from this process are unintentional and may not be fully corrected at the time of dictation.

## 2024-02-06 ENCOUNTER — Ambulatory Visit: Admitting: Pulmonary Disease

## 2024-02-10 ENCOUNTER — Encounter: Payer: Self-pay | Admitting: Pulmonary Disease

## 2024-02-20 DIAGNOSIS — K219 Gastro-esophageal reflux disease without esophagitis: Secondary | ICD-10-CM

## 2024-02-20 DIAGNOSIS — J690 Pneumonitis due to inhalation of food and vomit: Secondary | ICD-10-CM | POA: Diagnosis not present

## 2024-02-20 DIAGNOSIS — J441 Chronic obstructive pulmonary disease with (acute) exacerbation: Secondary | ICD-10-CM | POA: Diagnosis not present

## 2024-02-20 DIAGNOSIS — K529 Noninfective gastroenteritis and colitis, unspecified: Secondary | ICD-10-CM | POA: Diagnosis not present

## 2024-02-20 DIAGNOSIS — F419 Anxiety disorder, unspecified: Secondary | ICD-10-CM

## 2024-02-20 DIAGNOSIS — D469 Myelodysplastic syndrome, unspecified: Secondary | ICD-10-CM

## 2024-02-20 DIAGNOSIS — M1990 Unspecified osteoarthritis, unspecified site: Secondary | ICD-10-CM

## 2024-02-20 DIAGNOSIS — J9601 Acute respiratory failure with hypoxia: Secondary | ICD-10-CM | POA: Diagnosis not present

## 2024-02-20 DIAGNOSIS — I119 Hypertensive heart disease without heart failure: Secondary | ICD-10-CM

## 2024-02-20 DIAGNOSIS — E039 Hypothyroidism, unspecified: Secondary | ICD-10-CM

## 2024-02-20 DIAGNOSIS — D62 Acute posthemorrhagic anemia: Secondary | ICD-10-CM

## 2024-02-20 DIAGNOSIS — J479 Bronchiectasis, uncomplicated: Secondary | ICD-10-CM

## 2024-02-23 ENCOUNTER — Encounter: Payer: Self-pay | Admitting: Pharmacist

## 2024-02-23 NOTE — Progress Notes (Signed)
 Pharmacy Quality Measure Review  This patient is appearing on a report for being at risk of failing the adherence measure for cholesterol (statin) medications this calendar year.   Medication: rosuvastatin  10 mg Last fill date: 09/26/23 for 100 day supply  Insurance report was not up to date. No action needed at this time.  Medication has been refilled as of 02/01/24 x100 ds

## 2024-02-23 NOTE — Telephone Encounter (Signed)
 [facility] wants the meds list fax is [phone]

## 2024-02-29 ENCOUNTER — Ambulatory Visit

## 2024-03-01 ENCOUNTER — Inpatient Hospital Stay: Admitting: Nurse Practitioner

## 2024-03-01 ENCOUNTER — Inpatient Hospital Stay

## 2024-03-04 ENCOUNTER — Telehealth: Payer: Self-pay | Admitting: Nurse Practitioner

## 2024-03-04 ENCOUNTER — Ambulatory Visit

## 2024-03-04 ENCOUNTER — Other Ambulatory Visit

## 2024-03-04 DIAGNOSIS — J690 Pneumonitis due to inhalation of food and vomit: Secondary | ICD-10-CM | POA: Diagnosis not present

## 2024-03-04 NOTE — Telephone Encounter (Signed)
 Pt called to confirm appts for tomorrow - confirmed pt appt info - LH

## 2024-03-05 ENCOUNTER — Other Ambulatory Visit: Payer: Self-pay

## 2024-03-05 ENCOUNTER — Other Ambulatory Visit: Payer: Self-pay | Admitting: *Deleted

## 2024-03-05 ENCOUNTER — Inpatient Hospital Stay: Admitting: Nurse Practitioner

## 2024-03-05 ENCOUNTER — Inpatient Hospital Stay: Attending: Internal Medicine

## 2024-03-05 ENCOUNTER — Encounter: Payer: Self-pay | Admitting: Nurse Practitioner

## 2024-03-05 VITALS — BP 125/72 | HR 18 | Temp 98.9°F | Resp 18 | Ht 63.0 in | Wt 175.8 lb

## 2024-03-05 DIAGNOSIS — J9690 Respiratory failure, unspecified, unspecified whether with hypoxia or hypercapnia: Secondary | ICD-10-CM | POA: Diagnosis not present

## 2024-03-05 DIAGNOSIS — Z8701 Personal history of pneumonia (recurrent): Secondary | ICD-10-CM | POA: Diagnosis not present

## 2024-03-05 DIAGNOSIS — D509 Iron deficiency anemia, unspecified: Secondary | ICD-10-CM | POA: Insufficient documentation

## 2024-03-05 DIAGNOSIS — Z9841 Cataract extraction status, right eye: Secondary | ICD-10-CM | POA: Insufficient documentation

## 2024-03-05 DIAGNOSIS — J45909 Unspecified asthma, uncomplicated: Secondary | ICD-10-CM | POA: Diagnosis not present

## 2024-03-05 DIAGNOSIS — K6389 Other specified diseases of intestine: Secondary | ICD-10-CM | POA: Diagnosis not present

## 2024-03-05 DIAGNOSIS — M1990 Unspecified osteoarthritis, unspecified site: Secondary | ICD-10-CM | POA: Diagnosis not present

## 2024-03-05 DIAGNOSIS — E039 Hypothyroidism, unspecified: Secondary | ICD-10-CM | POA: Diagnosis not present

## 2024-03-05 DIAGNOSIS — E785 Hyperlipidemia, unspecified: Secondary | ICD-10-CM | POA: Insufficient documentation

## 2024-03-05 DIAGNOSIS — D469 Myelodysplastic syndrome, unspecified: Secondary | ICD-10-CM | POA: Diagnosis present

## 2024-03-05 DIAGNOSIS — I10 Essential (primary) hypertension: Secondary | ICD-10-CM | POA: Diagnosis not present

## 2024-03-05 DIAGNOSIS — Z8249 Family history of ischemic heart disease and other diseases of the circulatory system: Secondary | ICD-10-CM | POA: Insufficient documentation

## 2024-03-05 DIAGNOSIS — D46Z Other myelodysplastic syndromes: Secondary | ICD-10-CM

## 2024-03-05 DIAGNOSIS — Z9842 Cataract extraction status, left eye: Secondary | ICD-10-CM | POA: Insufficient documentation

## 2024-03-05 DIAGNOSIS — Z856 Personal history of leukemia: Secondary | ICD-10-CM | POA: Insufficient documentation

## 2024-03-05 DIAGNOSIS — Z833 Family history of diabetes mellitus: Secondary | ICD-10-CM | POA: Insufficient documentation

## 2024-03-05 DIAGNOSIS — Z7982 Long term (current) use of aspirin: Secondary | ICD-10-CM | POA: Insufficient documentation

## 2024-03-05 DIAGNOSIS — I2510 Atherosclerotic heart disease of native coronary artery without angina pectoris: Secondary | ICD-10-CM | POA: Insufficient documentation

## 2024-03-05 DIAGNOSIS — D696 Thrombocytopenia, unspecified: Secondary | ICD-10-CM | POA: Diagnosis not present

## 2024-03-05 DIAGNOSIS — Z83438 Family history of other disorder of lipoprotein metabolism and other lipidemia: Secondary | ICD-10-CM | POA: Diagnosis not present

## 2024-03-05 DIAGNOSIS — K922 Gastrointestinal hemorrhage, unspecified: Secondary | ICD-10-CM | POA: Diagnosis not present

## 2024-03-05 DIAGNOSIS — D539 Nutritional anemia, unspecified: Secondary | ICD-10-CM | POA: Insufficient documentation

## 2024-03-05 DIAGNOSIS — Z90710 Acquired absence of both cervix and uterus: Secondary | ICD-10-CM | POA: Diagnosis not present

## 2024-03-05 DIAGNOSIS — Z79899 Other long term (current) drug therapy: Secondary | ICD-10-CM | POA: Insufficient documentation

## 2024-03-05 LAB — CBC WITH DIFFERENTIAL (CANCER CENTER ONLY)
Abs Immature Granulocytes: 0.01 K/uL (ref 0.00–0.07)
Basophils Absolute: 0.1 K/uL (ref 0.0–0.1)
Basophils Relative: 1 %
Eosinophils Absolute: 0.2 K/uL (ref 0.0–0.5)
Eosinophils Relative: 4 %
HCT: 35.8 % — ABNORMAL LOW (ref 36.0–46.0)
Hemoglobin: 11.7 g/dL — ABNORMAL LOW (ref 12.0–15.0)
Immature Granulocytes: 0 %
Lymphocytes Relative: 27 %
Lymphs Abs: 1.6 K/uL (ref 0.7–4.0)
MCH: 32.5 pg (ref 26.0–34.0)
MCHC: 32.7 g/dL (ref 30.0–36.0)
MCV: 99.4 fL (ref 80.0–100.0)
Monocytes Absolute: 0.5 K/uL (ref 0.1–1.0)
Monocytes Relative: 8 %
Neutro Abs: 3.5 K/uL (ref 1.7–7.7)
Neutrophils Relative %: 60 %
Platelet Count: 196 K/uL (ref 150–400)
RBC: 3.6 MIL/uL — ABNORMAL LOW (ref 3.87–5.11)
RDW: 13.7 % (ref 11.5–15.5)
WBC Count: 5.9 K/uL (ref 4.0–10.5)
nRBC: 0 % (ref 0.0–0.2)

## 2024-03-05 LAB — CMP (CANCER CENTER ONLY)
ALT: 12 U/L (ref 0–44)
AST: 12 U/L — ABNORMAL LOW (ref 15–41)
Albumin: 3.8 g/dL (ref 3.5–5.0)
Alkaline Phosphatase: 65 U/L (ref 38–126)
Anion gap: 8 (ref 5–15)
BUN: 14 mg/dL (ref 8–23)
CO2: 28 mmol/L (ref 22–32)
Calcium: 9.1 mg/dL (ref 8.9–10.3)
Chloride: 102 mmol/L (ref 98–111)
Creatinine: 0.81 mg/dL (ref 0.44–1.00)
GFR, Estimated: >60 mL/min (ref >60)
Glucose, Bld: 69 mg/dL — ABNORMAL LOW (ref 70–99)
Potassium: 3.7 mmol/L (ref 3.5–5.1)
Sodium: 138 mmol/L (ref 135–145)
Total Bilirubin: 0.6 mg/dL (ref 0.0–1.2)
Total Protein: 6.9 g/dL (ref 6.5–8.1)

## 2024-03-05 LAB — IRON AND TIBC
Iron: 52 ug/dL (ref 28–170)
Saturation Ratios: 18 % (ref 10.4–31.8)
TIBC: 297 ug/dL (ref 250–450)
UIBC: 245 ug/dL

## 2024-03-05 LAB — FERRITIN: Ferritin: 113 ng/mL (ref 11–307)

## 2024-03-05 NOTE — Progress Notes (Signed)
 [facility] CONSULT NOTE  Patient Care Team: [name], MD as PCP - General (Internal Medicine) [name], MD as PCP - Cardiology (Cardiology) [name], MD (Internal Medicine) [name], [name], MD (General Surgery) [name], MD as Consulting Physician (Oncology) Aundria Ladell POUR, MD as Consulting Physician (Gastroenterology)  CHIEF COMPLAINTS/PURPOSE OF CONSULTATION: MDS  HEMATOLOGY HISTORY: Oncology History Overview Note  DEC 2023- DIAGNOSIS: LOW GRADE MDS- 5 Q DEL;   # MACROCYTIC ANEMIA [since 2022- April hb 11; MCV-103; platelet- 120s] EGD/ colonoscopy >>> 10 years ago; OCT 2022-slight improvement blood counts/stable; hepatitis HIV negative.  Myeloma panel negative.   BONE MARROW, ASPIRATE, CLOT, CORE: -Hypercellular bone marrow for age with dyspoietic changes -See comment  PERIPHERAL BLOOD: -Macrocytic anemia -Thrombocytopenia  COMMENT:  The overall findings are very concerning for involvement by a low-grade myelodysplastic syndrome.  The differential diagnosis includes changes related to nutritional deficiency, medication, immune mediated process, infection, etc. Correlation with cytogenetic and FISH studies is recommended.  MICROSCOPIC DESCRIPTION:  PERIPHERAL BLOOD SMEAR: The red blood cells display mild anisopoikilocytosis with mild polychromasia.  The white blood cells are normal in number with occasional hypogranular and/or hypolobated neutrophils.  Scattered reactive appearing lymphocytes are present.  The platelets are decreased in number.  BONE MARROW ASPIRATE: Bone marrow particles present. Erythroid precursors: Progressive maturation with occasional late precursors displaying nuclear cytoplasmic dyssynchrony. Granulocytic precursors: Progressive maturation with many mature neutrophils displaying hypogranulation and/or hypolobation.  No increase in blastic cells identified. Megakaryocytes: Abundant with  many small and/or hypolobated/unilobated forms Lymphocytes/plasma cells: Large aggregates not present  TOUCH PREPARATIONS: A mixture of cell types present.  CLOT AND BIOPSY: The sections show variable cellularity ranging from 10% focally to 70% with a mixture of cell types.  Expansile sheets of blastic cells are not identified.  A small interstitial lymphoid aggregate composed of small lymphoid cells is seen.  IRON  STAIN: Iron  stains are performed on a bone marrow aspirate or touch imprint smear and section of clot. The controls stained appropriately.       Storage Iron :  Abundant      Ring Sideroblasts: Absent  ADDITIONAL DATA/TESTING: The specimen was sent for cytogenetic analysis and FISH for MDS and a separate report will follow. Flow cytometric analysis (WLS [id number]) was performed and shows T cells with nonspecific changes.  No monoclonal B-cell population or significant CD34 positive blastic population identified.  # LATE AUG 2025- start rev 10 mg- 3 w/1 w     #Incidental right hepatic lobe 13 mm hypoechoic lesion [ultrasound-October 2022]-MRI OCT 2022-   Low clinical concern for metastatic disease; however need to rule out primary HCC versus others.  1. There are 3 small liver lesions, 2 of which represent simple cysts. 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 MAY 2023-abdominal MRI -benign cyst no further surveillance recommended.   #October 2022-ultrasound incidental bilateral mild to moderate hydronephrosis.-Rs/p evaluation with Dr.Brandon urology.     MDS (myelodysplastic syndrome) (HCC)  04/20/2022 Initial Diagnosis   MDS (myelodysplastic syndrome), low grade (HCC)    HISTORY OF PRESENTING ILLNESS: Ambulating independently.  With friend.   Sarah [name] 73 y.o. female with above history of MDS, low-grade with 5q deletion, who presents today for follow-up.  Revlimid  continues to be held.   She feels well and in the interim has taken.  She feels back to her baseline or stronger.  She denies complaints today.  Review  of Systems  Constitutional:  Positive for malaise/fatigue. Negative for chills, diaphoresis, fever and weight loss.  HENT:  Negative for nosebleeds and sore throat.   Eyes:  Negative for double vision.  Respiratory:  Negative for cough, hemoptysis, sputum production, shortness of breath and wheezing.   Cardiovascular:  Negative for chest pain, palpitations, orthopnea and leg swelling.  Gastrointestinal:  Negative for abdominal pain, blood in stool, constipation, diarrhea, heartburn, melena, nausea and vomiting.  Genitourinary:  Negative for dysuria, frequency and urgency.  Musculoskeletal:  Negative for back pain and joint pain.  Skin: Negative.  Negative for itching and rash.  Neurological:  Negative for dizziness, tingling, focal weakness, weakness and headaches.  Endo/Heme/Allergies:  Does not bruise/bleed easily.  Psychiatric/Behavioral:  Negative for depression. The patient is not nervous/anxious and does not have insomnia.    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   Wheezing     SURGICAL HISTORY: Past Surgical History:  Procedure Laterality Date   ABDOMINAL HYSTERECTOMY  04/26/1987   ANTERIOR CERVICAL DECOMPRESSION/DISCECTOMY FUSION 4 LEVELS N/A 11/10/2021   Procedure: C3-7 ANTERIOR CERVICAL DISCECTOMY AND FUSION (GLOBUS FORGE);  Surgeon: Clois Fret, MD;  Location: [facility];  Service: Neurosurgery;  Laterality: N/A;   BROW LIFT Bilateral 02/07/2020   Procedure: BLEPHAROPLASTY  UPPER EYELID; W/EXCESS SKIN BROW PTOSIS REPAIR BILATERAL;  Surgeon: Ashley Greig HERO, MD;  Location: [facility];  Service: Ophthalmology;  Laterality: Bilateral;   CATARACT EXTRACTION W/PHACO Right 09/13/2016   Procedure: CATARACT EXTRACTION PHACO AND INTRAOCULAR LENS PLACEMENT (IOC);  Surgeon: Jaye Fallow, MD;  Location: [facility];  Service: Ophthalmology;  Laterality: Right;  US  00:38 AP% 14.3 CDE 5.51 Fluid pack lot # [id number]   CATARACT EXTRACTION W/PHACO Left 10/11/2016   Procedure: CATARACT EXTRACTION PHACO AND INTRAOCULAR LENS PLACEMENT (IOC);  Surgeon: Jaye Fallow, MD;  Location: [facility];  Service: Ophthalmology;  Laterality: Left;  US  00:30 AP% 11.3 CDE 3.50 fluid pack lot # [id number]   COLONOSCOPY WITH PROPOFOL  N/A 09/30/2014   Procedure: COLONOSCOPY WITH PROPOFOL ;  Surgeon: Reyes LELON Cota, MD;  Location: [facility];  Service: Endoscopy;  Laterality: N/A;   COLONOSCOPY WITH PROPOFOL  N/A 03/24/2021   Procedure: COLONOSCOPY WITH PROPOFOL ;  Surgeon: Unk Corinn Skiff, MD;  Location: [facility];  Service: Gastroenterology;  Laterality: N/A;   ESOPHAGOGASTRODUODENOSCOPY N/A 09/30/2014   Procedure: ESOPHAGOGASTRODUODENOSCOPY (EGD);  Surgeon: Reyes LELON Cota, MD;  Location: [facility];  Service: Endoscopy;  Laterality: N/A;   ESOPHAGOGASTRODUODENOSCOPY N/A 03/24/2021   Procedure: ESOPHAGOGASTRODUODENOSCOPY (EGD);  Surgeon: Unk Corinn Skiff, MD;  Location[facility];  Service: Gastroenterology;  Laterality: N/A;   ESOPHAGOGASTRODUODENOSCOPY N/A 01/22/2024   Procedure: EGD (ESOPHAGOGASTRODUODENOSCOPY);  Surgeon: Jinny Carmine, MD;  Location: [facility] END[facility]COPY;  Service: Endoscopy;  Laterality: N/A;   FOOT SURGERY Bilateral 04/26/1983   KNEE SURGERY  04/25/2014    SOCIAL HISTORY: Social History   Socioeconomic History   Marital status: Single    Spouse name: Not on file   Number of children: Not on file   Years of education: Not on file  Highest education level: Not on file  Occupational History   Not on file  Tobacco Use   Smoking status: Never   Smokeless tobacco: Never  Vaping Use   Vaping status: Never Used  Substance and Sexual Activity   Alcohol use: No   Drug use: No   Sexual activity: Not Currently  Other Topics Concern   Not on file  Social History Narrative   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 No smoking; no alcohol. Lives in [place], [place] lives self/kitty.    Social Drivers of Corporate Investment Banker Strain: Low Risk  (05/15/2023)   Overall Financial Resource Strain (CARDIA)    Difficulty of Paying Living Expenses: Not hard at all  Food Insecurity: No Food Insecurity (01/24/2024)   Hunger Vital Sign    Worried About Running Out of Food in the Last Year: Never true    Ran Out of Food in the Last Year: Never true  Transportation Needs: No Transportation Needs (01/24/2024)   PRAPARE - Administrator, Civil Service (Medical): No    Lack of Transportation (Non-Medical): No  Physical Activity: Inactive (05/15/2023)   Exercise Vital Sign    Days of Exercise per Week: 0 days    Minutes of Exercise per Session: 0 min  Stress: No Stress Concern Present (05/15/2023)   [employer] of Occupational Health - Occupational Stress Questionnaire    Feeling of Stress : Not at all  Social Connections: Moderately Isolated (01/12/2024)   Social Connection and Isolation Panel    Frequency of Communication with Friends and Family: Once a week    Frequency of Social Gatherings with Friends and Family: Once a week    Attends Religious Services: 1 to 4 times per year    Active Member of [employer] or Organizations: Yes    Attends Banker Meetings: 1 to 4 times per year    Marital Status: Never married  Intimate Partner Violence: Not At Risk (01/24/2024)   Humiliation, Afraid, Rape, and Kick questionnaire    Fear of Current or Ex-Partner: No    Emotionally Abused: No    Physically Abused:  No    Sexually Abused: No    FAMILY HISTORY: Family History  Problem Relation Age of Onset   Diabetes Mother    Hyperlipidemia Mother    Heart disease Mother    Prostate cancer Neg Hx    Kidney cancer Neg Hx    Bladder Cancer Neg Hx     ALLERGIES:  has no known allergies.  MEDICATIONS:  Current Outpatient Medications  Medication Sig Dispense Refill   albuterol  (VENTOLIN  HFA) 108 (90 Base) MCG/ACT inhaler Inhale 1-2 puffs into the lungs every 6 (six) hours as needed for wheezing or shortness of breath. 8.5 g 11   amLODipine  (NORVASC ) 5 MG tablet Take 0.5 tablets (2.5 mg total) by mouth daily.     Calcium  Citrate-Vitamin D  (CALCIUM  CITRATE + D PO) Take 1 tablet by mouth 2 (two) times daily.     citalopram  (CELEXA ) 20 MG tablet TAKE 1 TABLET BY MOUTH DAILY 100 tablet 2   ferrous sulfate  324 MG TBEC Take 1 tablet (324 mg total) by mouth daily with breakfast. 65 mg of elemental iron      fexofenadine (ALLEGRA) 180 MG tablet Take 180 mg by mouth daily.     Fluticasone -Umeclidin-Vilant (TRELEGY ELLIPTA ) 200-62.5-25 MCG/ACT AEPB Inhale 1 puff into the lungs daily. 28 each 0   gabapentin  (NEURONTIN ) 100 MG capsule TAKE 1 CAPSULE  BY MOUTH 3 TIMES  DAILY 300 capsule 2   levothyroxine  (SYNTHROID ) 50 MCG tablet TAKE 1 TABLET BY MOUTH DAILY ON  AN EMPTY STOMACH WITH A GLASS OF WATER  AT LEAST 30 TO 60 MINUTES  BEFORE BREAKFAST 100 tablet 2   oxybutynin  (DITROPAN  XL) 15 MG 24 hr tablet Take 1 tablet (15 mg total) by mouth at bedtime. 30 tablet 2   pantoprazole  (PROTONIX ) 40 MG tablet Take 1 tablet (40 mg total) by mouth daily. 30 tablet 0   rosuvastatin  (CRESTOR ) 10 MG tablet Take 1 tablet (10 mg total) by mouth daily. 100 tablet 3   traZODone  (DESYREL ) 50 MG tablet TAKE 1/2 TO 1 TABLET BY MOUTH AT BEDTIME AS NEEDED FOR SLEEP 90 tablet 3   vitamin B-12 (CYANOCOBALAMIN ) 1000 MCG tablet Take 1,000 mcg by mouth daily.     No current facility-administered medications for this visit.   PHYSICAL  EXAMINATION: Vitals:   03/05/24 1415  BP: 125/72  Pulse: (!) 18  Resp: 18  Temp: 98.9 F (37.2 C)   Filed Weights   03/05/24 1410  Weight: 175 lb 12.8 oz (79.7 kg)   Physical Exam Vitals and nursing note reviewed.  HENT:     Head: Normocephalic and atraumatic.     Mouth/Throat:     Pharynx: Oropharynx is clear.  Eyes:     Extraocular Movements: Extraocular movements intact.     Pupils: Pupils are equal, round, and reactive to light.  Cardiovascular:     Rate and Rhythm: Normal rate and regular rhythm.  Pulmonary:     Comments: Decreased breath sounds bilaterally.  Abdominal:     Palpations: Abdomen is soft.  Musculoskeletal:        General: Normal range of motion.     Cervical back: Normal range of motion.  Skin:    General: Skin is warm.  Neurological:     General: No focal deficit present.     Mental Status: She is alert and oriented to person, place, and time.  Psychiatric:        Behavior: Behavior normal.        Judgment: Judgment normal.    LABORATORY DATA:  I have reviewed the data as listed Lab Results  Component Value Date   WBC 5.9 03/05/2024   HGB 11.7 (L) 03/05/2024   HCT 35.8 (L) 03/05/2024   MCV 99.4 03/05/2024   PLT 196 03/05/2024   Recent Labs    01/17/24 0241 01/18/24 0524 01/19/24 0355 01/20/24 0340 01/22/24 0410 01/30/24 1057 03/05/24 1355  NA 141   < > 142  --  140 137 138  K 3.5   < > 3.5  --  3.8 3.5 3.7  CL 99   < > 100  --  107 106 102  CO2 30   < > 32  --  27 24 28   GLUCOSE 118*   < > 155*  --  111* 107* 69*  BUN 21   < > 23  --  15 17 14   CREATININE 1.35*   < > 1.10*   < > 0.76 0.69 0.81  CALCIUM  8.6*   < > 8.2*  --  8.4* 9.2 9.1  GFRNONAA 41*   < > 53*   < > >60 >60 >60  PROT 6.0*  --   --   --   --  7.0 6.9  ALBUMIN 2.8*   < > 2.6*  --   --  3.6 3.8  AST 17  --   --   --   --  16 12*  ALT 11  --   --   --   --  15 12  ALKPHOS 44  --   --   --   --  58 65  BILITOT 1.0  --   --   --   --  0.7 0.6   < > = values in  this interval not displayed.      Component Value Date/Time   IRON  10 (L) 01/13/2024 0626   TIBC 230 (L) 01/13/2024 0626   FERRITIN 56 01/13/2024 0626   IRONPCTSAT 4 (L) 01/13/2024 0626   IRONPCTSAT 26 05/18/2022 1424   No results found.  Assessment & Plan:   # Proctosigmoiditis with acute gastrointestinal bleeding and iron  deficiency anemia - Recent hospitalization for infectious colitis with gastrointestinal bleeding led to significant blood loss and iron  deficiency anemia. Hemoglobin improved from 7.4 to 10.2 after IV iron . Infection confirmed via CT scan, with bleeding likely due to colitis. No current black or tarry stools, indicating improvement. - Completed Augmentin  - Monitor for recurrence of symptoms such as black tarry stools or abdominal pain. - hemoglobin improved to 10.2. s/p IV iron   - ferritin and iron  studies are pending today.  - Delay colonoscopy until she is stronger and the infection is resolved.  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  Hemoglobin around 11-12.  Platelets 127-140s.  Normal white count.  December 2023 bone marrow biopsy revealed hypercellular bone marrow for age and dyspoietic changes.  No blasts.  Cytogenetics/FISH positive for 5 q. deletion.  August 2025 hemoglobin 9.5, platelets greater than 100.  White count normal.  Symptomatic with worsening fatigue.  Started Revlimid  10 mg 3 weeks on 1 week off along with aspirin  81 mg daily.  Tolerated well but unclear if her recent colitis was related to Revlimid  or not.  Additionally, she experienced adverse effects including blurred vision.  She had completed her first cycle when symptoms developed.  Revlimid  now on hold. -Revlimid  was held.  - Today, hmg 11.7. Improved. Plan to continue to hold revlimid  for now. Symptomatically doing well. Plan for recheck in 8 weeks.  - May consider dose adjustment in the future.  #  Respiratory Failure requiring intubation - Secondary to aspiration and aspiration pneumonia of bowel prep. Required mechanical intubation then extubated to HFNC and alternating bipap. Weaned and discharged on room air.  - monitor.   # Weight loss - Monitor. Encouraged protein rich foods for recovery.  - weight is now stable.   # History of B12 deficiency - on B12 supplementation.  2024 elevated B12.   - Continue B12 every other day.  # Nonobstructive CAD - recent cardiac CTA showed mild nonobstructive CAD.  - On aspirin  81 mg a day.  Continue  Disposition: 8 weeks- lab (CBC, CMP, Dr Rennie- la  No problem-specific Assessment & Plan notes found for this encounter.  All questions were answered. The patient knows to call the clinic with any problems, questions or concerns.    Tinnie KANDICE Dawn, NP 03/05/2024

## 2024-03-05 NOTE — Patient Instructions (Signed)
 Hemoglobin was 11.7 today! Continue to hold revlimid  and we'll see you in 2 months. Caroleen!

## 2024-03-07 ENCOUNTER — Ambulatory Visit: Payer: Self-pay | Admitting: Internal Medicine

## 2024-03-08 ENCOUNTER — Other Ambulatory Visit: Payer: Self-pay | Admitting: Pulmonary Disease

## 2024-03-08 DIAGNOSIS — R053 Chronic cough: Secondary | ICD-10-CM

## 2024-03-08 DIAGNOSIS — R0600 Dyspnea, unspecified: Secondary | ICD-10-CM

## 2024-03-08 DIAGNOSIS — D46Z Other myelodysplastic syndromes: Secondary | ICD-10-CM

## 2024-03-08 DIAGNOSIS — J4540 Moderate persistent asthma, uncomplicated: Secondary | ICD-10-CM

## 2024-03-08 NOTE — Telephone Encounter (Signed)
 Copied from CRM [id number]. Topic: Clinical - Medication Refill >> Mar 08, 2024  5:24 PM Rozanna MATSU wrote: Medication: Fluticasone -Umeclidin-Vilant (TRELEGY ELLIPTA ) 200-62.5-25 MCG/ACT  Has the patient contacted their pharmacy? Yes (Agent: If no, request that the patient contact the pharmacy for the refill. If patient does not wish to contact the pharmacy document the reason why and proceed with request.) (Agent: If yes, when and what did the pharmacy advise?)  This is the patient's preferred pharmacy:  [facility] - [place], [place] - [address] [place] [address] Phone: [phone] Fax: [phone]   Is this the correct pharmacy for this prescription? Yes If no, delete pharmacy and type the correct one.   Has the prescription been filled recently? Yes  Is the patient out of the medication? Yes  Has the patient been seen for an appointment in the last year OR does the patient have an upcoming appointment? Yes  Can we respond through MyChart? No  Agent: Please be advised that Rx refills may take up to 3 business days. We ask that you follow-up with your pharmacy.

## 2024-03-11 ENCOUNTER — Ambulatory Visit

## 2024-03-11 MED ORDER — TRELEGY ELLIPTA 200-62.5-25 MCG/ACT IN AEPB: 1.0000 | INHALATION_SPRAY | Freq: Every day | RESPIRATORY_TRACT | 10 refills | Status: AC

## 2024-03-11 NOTE — Telephone Encounter (Signed)
 Approved renewal in other open encounter.

## 2024-03-13 ENCOUNTER — Ambulatory Visit
Admission: RE | Admit: 2024-03-13 | Discharge: 2024-03-13 | Disposition: A | Discharge status: Home / Self Care | Source: Ambulatory Visit | Attending: Pulmonary Disease | Admitting: Pulmonary Disease

## 2024-03-13 DIAGNOSIS — J479 Bronchiectasis, uncomplicated: Secondary | ICD-10-CM | POA: Diagnosis present

## 2024-03-14 ENCOUNTER — Ambulatory Visit

## 2024-03-14 ENCOUNTER — Encounter: Payer: Self-pay | Admitting: Pulmonary Disease

## 2024-03-14 ENCOUNTER — Ambulatory Visit: Admitting: Adult Health

## 2024-03-14 ENCOUNTER — Ambulatory Visit: Admitting: Pulmonary Disease

## 2024-03-14 VITALS — BP 116/64 | HR 71 | Temp 97.6°F | Ht 63.0 in | Wt 178.0 lb

## 2024-03-14 DIAGNOSIS — J479 Bronchiectasis, uncomplicated: Secondary | ICD-10-CM | POA: Diagnosis not present

## 2024-03-14 DIAGNOSIS — J4540 Moderate persistent asthma, uncomplicated: Secondary | ICD-10-CM

## 2024-03-14 DIAGNOSIS — R053 Chronic cough: Secondary | ICD-10-CM | POA: Diagnosis not present

## 2024-03-14 DIAGNOSIS — D46Z Other myelodysplastic syndromes: Secondary | ICD-10-CM | POA: Diagnosis not present

## 2024-03-14 NOTE — Progress Notes (Signed)
 Subjective:    Patient ID: Sarah [name], female    DOB: Mar 01, 1951, 73 y.o.   MRN: [id number]  Patient Care Team: [name], MD as PCP - General (Internal Medicine) [name], MD as PCP - Cardiology (Cardiology) [name], MD (Internal Medicine) [name] [name], MD (General Surgery) [name], MD as Consulting Physician (Oncology) [place], Teodoro K, MD as Consulting Physician (Gastroenterology)  Chief Complaint  Patient presents with   Asthma    Occasional cough. No shortness of breath or wheezing.     BACKGROUND/INTERVAL:Patient is a very complex 73 year old lifelong never smoker with a history of moderate persistent asthma and bronchiectasis who presents today as a scheduled visit.  Her management is complicated by the presence of myelodysplastic syndrome/CML and severe anemia.  I last saw the patient on 05 February 2024.  She has been maintained on Trelegy Ellipta  200, 1 inhalation daily for her asthma.  She is also on rescue albuterol .  She is on CPAP for obstructive sleep apnea.  Since her last visit she was admitted to [facility] on 12 January 2024 with severe colitis.  During workup the patient was given bowel prep for colonoscopy and vomited and aspirated.  She had to be intubated and had to have a bronchoscopy to clear her airway.  Bronchoscopy also revealed dynamic airway collapse at the level of the distal trachea, left and right main bronchi.   HPI Discussed the use of AI scribe software for clinical note transcription with the patient, who gave verbal consent to proceed.  History of Present Illness   Sarah [name] is a 73 year old female with myelodysplastic syndrome who presents for follow-up of asthmatic bronchitis and bronchiectasis.  She feels much better recently, noting an improvement in her hemoglobin levels to 11.7, which she describes as 'very good'.  She underwent a CT scan at the imaging center yesterday, although she forgot  her appointment twice previously.  Radiology has not interpreted the film yet however, I have reviewed and independently and have discussed my findings with the patient.  She recalls a previous episode of pneumonia and ARDS and inquires about the presence of pneumonia in her recent imaging.  On review of the prior severe changes of pneumonia and ARDS have resolved.  She recently went on a trip to [place] with her family, which she enjoyed and felt contributed to her feeling better.  She is up-to-date on her vaccinations including pneumonia.     Overall she feels well and looks well.  Review of Systems A 10 point review of systems was performed and it is as noted above otherwise negative.   Patient Active Problem List   Diagnosis Date Noted   Melena 01/22/2024   Hemoptysis 01/19/2024   Acute respiratory failure with hypoxia (HCC) 01/18/2024   Aspiration pneumonia (HCC) 01/18/2024   COPD with acute exacerbation (HCC) 01/18/2024   Acute on chronic blood loss anemia 01/18/2024   AKI (acute kidney injury) 01/18/2024   Hypokalemia 01/18/2024   Hematochezia 01/18/2024   Bronchiectasis (HCC) 11/07/2023   History of acute respiratory failure 09/04/2023   Right middle lobe pneumonia 09/04/2023   History of cystitis 05/07/2023   Coronary artery disease due to calcified coronary lesion 05/05/2023   Persistent asthma with acute exacerbation 05/05/2023   Bilateral lower extremity edema 12/02/2022   OSA (obstructive sleep apnea) 09/06/2022   Hyperglycemia 08/15/2022   Snoring 06/03/2022   Hypersomnia 05/18/2022   Suprapubic cramping 05/18/2022   Overactive  bladder 05/18/2022   MDS (myelodysplastic syndrome) (HCC) 04/20/2022   Tubular adenoma of colon 02/07/2022   Cervical myelopathy (HCC) 11/10/2021   Spinal stenosis, cervical region 09/15/2021   Adenomatous polyp of transverse colon    Polyp of colon    Thrombocytopenia 01/13/2021   Essential hypertension 12/20/2020   Macrocytic  anemia 12/18/2020   Allergic rhinitis 12/13/2020   Fatigue 12/13/2020   Insomnia 12/13/2020   Grief reaction 08/09/2020   Cognitive complaints 08/09/2020   Neuropathy 06/14/2020   Educated about COVID-19 virus infection 04/10/2019   Prediabetes 10/13/2018   LVH (left ventricular hypertrophy) due to hypertensive disease, without heart failure 10/13/2018   Hospital discharge follow-up 07/08/2018   Hypothyroidism, unspecified 06/15/2017   Post herpetic neuralgia 06/06/2017   Encounter for preventive health examination 09/09/2014   Chronic GERD 09/09/2014   Hyperlipidemia 08/17/2014   S/P hysterectomy 04/28/2013   Initial Medicare annual wellness visit 04/28/2013   Anxiety and depression     Social History   Tobacco Use   Smoking status: Never   Smokeless tobacco: Never  Substance Use Topics   Alcohol use: No    No Known Allergies  Current Meds  Medication Sig   albuterol  (VENTOLIN  HFA) 108 (90 Base) MCG/ACT inhaler Inhale 1-2 puffs into the lungs every 6 (six) hours as needed for wheezing or shortness of breath.   amLODipine  (NORVASC ) 5 MG tablet Take 0.5 tablets (2.5 mg total) by mouth daily.   Calcium  Citrate-Vitamin D  (CALCIUM  CITRATE + D PO) Take 1 tablet by mouth 2 (two) times daily.   citalopram  (CELEXA ) 20 MG tablet TAKE 1 TABLET BY MOUTH DAILY   ferrous sulfate  324 MG TBEC Take 1 tablet (324 mg total) by mouth daily with breakfast. 65 mg of elemental iron    fexofenadine (ALLEGRA) 180 MG tablet Take 180 mg by mouth daily.   Fluticasone -Umeclidin-Vilant (TRELEGY ELLIPTA ) 200-62.5-25 MCG/ACT AEPB Inhale 1 puff into the lungs daily.   gabapentin  (NEURONTIN ) 100 MG capsule TAKE 1 CAPSULE BY MOUTH 3 TIMES  DAILY   levothyroxine  (SYNTHROID ) 50 MCG tablet TAKE 1 TABLET BY MOUTH DAILY ON  AN EMPTY STOMACH WITH A GLASS OF WATER  AT LEAST 30 TO 60 MINUTES  BEFORE BREAKFAST   oxybutynin  (DITROPAN  XL) 15 MG 24 hr tablet Take 1 tablet (15 mg total) by mouth at bedtime.    pantoprazole  (PROTONIX ) 40 MG tablet Take 1 tablet (40 mg total) by mouth daily.   rosuvastatin  (CRESTOR ) 10 MG tablet Take 1 tablet (10 mg total) by mouth daily.   traZODone  (DESYREL ) 50 MG tablet TAKE 1/2 TO 1 TABLET BY MOUTH AT BEDTIME AS NEEDED FOR SLEEP   vitamin B-12 (CYANOCOBALAMIN ) 1000 MCG tablet Take 1,000 mcg by mouth daily.    Immunization History  Administered Date(s) Administered    sv, Bivalent, Protein Subunit Rsvpref,pf (Abrysvo) 05/26/2022   Fluad Quad(high Dose 65+) 12/06/2018   Fluad Trivalent(High Dose 65+) 01/16/2023   INFLUENZA, HIGH DOSE SEASONAL PF 04/22/2016, 06/05/2017, 02/01/2024   Influenza,inj,Quad PF,6+ Mos 04/26/2013, 01/30/2015   Influenza-Unspecified 01/24/2018, 12/06/2018, 01/02/2020, 04/02/2021, 02/03/2022   Moderna Covid-19 Fall Seasonal Vaccine 74yrs & older 02/10/2023   Moderna Covid-19 Vaccine Bivalent Booster 82yrs & up 02/05/2021, 02/03/2022   Moderna Sars-Covid-2 Vaccination 06/20/2019, 07/22/2019, 02/14/2020   PNEUMOCOCCAL CONJUGATE-20 11/09/2020   Pneumococcal Conjugate-13 09/01/2014, 01/24/2018   Pneumococcal Polysaccharide-23 04/26/2013, 02/28/2019   Tdap 12/21/2010, 06/18/2014, 05/26/2022   Zoster Recombinant(Shingrix) 07/08/2017, 09/22/2017        Objective:     Vitals:   03/14/24  1419  BP: 116/64  Pulse: 71  Temp: 97.6 F (36.4 C)  Height: 5' 3 (1.6 m)  Weight: 178 lb (80.7 kg)  SpO2: 97%  TempSrc: Temporal  BMI (Calculated): 31.54     SpO2: 97 %  GENERAL: Overweight woman, no acute distress.  Fully ambulatory, no conversational dyspnea, no pallor noted today. HEAD: Normocephalic, atraumatic.  EYES: Pupils equal, round, reactive to light.  No scleral icterus.  Pale conjunctiva. MOUTH: Natural dentition present, few chipped teeth.  Oral mucosa moist. NECK: Supple. No thyromegaly. Trachea midline. No JVD.  No adenopathy. PULMONARY: Good air entry bilaterally.  No adventitious sounds. CARDIOVASCULAR: S1 and S2. Regular  rate and rhythm.  No rubs, murmurs gallops heard. GASTROINTESTINAL: Benign. MUSCULOSKELETAL: No joint deformity, no clubbing, no edema.  NEUROLOGIC: No overt focal deficits. SKIN: Intact,warm,dry. PSYCH: Mood and behavior appropriate.    Independently reviewed chest CT high-resolution of 19 November.  Radiology report pending.  Previous changes of pneumonia and ARDS have resolved.  Assessment & Plan:     ICD-10-CM   1. Moderate persistent asthma without complication  J45.40     2. MDS (myelodysplastic syndrome), low grade (HCC)  D46.Z     3. Bronchiectasis without complication (HCC)  J47.9     4. Chronic cough  R05.3      Discussion:    Bronchiectasis in the setting of myelodysplastic syndrome Significant improvement in lung condition with dramatic change on recent CT scan compared to previous imaging showing pneumonia and ARDS. Hemoglobin level is 11.7, indicating improved blood work. Reports feeling much better overall. - Will schedule follow-up appointment in four months to monitor progress. - Continue Azithromycin  250 mg Monday Wednesday Friday  Chronic cough This is well-controlled.  No recent issues with cough.    MDS Hemoglobin and other hematologic parameters appear stable.  She is off of Revlimid . - Continue follow-up with Dr. Rennie. - This issue adds complexity to her management  Advised if symptoms do not improve or worsen, to please contact office for sooner follow up or seek emergency care.    I spent 30 minutes of dedicated to the care of this patient on the date of this encounter to include pre-visit review of records, face-to-face time with the patient discussing conditions above, post visit ordering of testing, clinical documentation with the electronic health record, making appropriate referrals as documented, and communicating necessary findings to members of the patients care team.     C. Leita Sanders, MD Advanced Bronchoscopy P[facility] [place]    *This note was generated using voice recognition software/Dragon and/or AI transcription program.  Despite best efforts to proofread, errors can occur which can change the meaning. Any transcriptional errors that result from this process are unintentional and may not be fully corrected at the time of dictation.

## 2024-03-14 NOTE — Patient Instructions (Signed)
 VISIT SUMMARY:  Today, we discussed your recent improvement in health, particularly your lung condition and hemoglobin levels. You mentioned feeling much better and enjoying a recent trip to [facility]. We reviewed your recent CT scan and noted significant improvement compared to previous imaging.  YOUR PLAN:  -BRONCHIECTASIS IN THE SETTING OF MYELODYSPLASTIC SYNDROME: Bronchiectasis is a condition where the airways in your lungs become widened and scarred, often due to infections or other conditions like myelodysplastic syndrome, which affects blood cell production. Your recent CT scan showed significant improvement in your lung condition, and your hemoglobin level is now 11.7, indicating better blood work. We will schedule a follow-up appointment in four months to monitor your progress.  INSTRUCTIONS:  Please schedule a follow-up appointment in four months to monitor your progress.

## 2024-03-18 ENCOUNTER — Ambulatory Visit: Payer: Self-pay | Admitting: Pulmonary Disease

## 2024-03-25 ENCOUNTER — Telehealth: Payer: Self-pay

## 2024-03-25 DIAGNOSIS — G4733 Obstructive sleep apnea (adult) (pediatric): Secondary | ICD-10-CM

## 2024-03-25 NOTE — Addendum Note (Signed)
 Addended by: Hser Belanger J on: 03/25/2024 12:49 PM   Modules accepted: Orders

## 2024-03-25 NOTE — Telephone Encounter (Signed)
 CPAP supplies order placed to [facility]. NFN.

## 2024-03-25 NOTE — Telephone Encounter (Signed)
 Spoke with pt to notify of new mask order placed. NFN.

## 2024-03-25 NOTE — Telephone Encounter (Signed)
 Ok to send new mask order.

## 2024-03-25 NOTE — Telephone Encounter (Signed)
 Copied from CRM #[id number]. Topic: Clinical - Order For Equipment >> Mar 25, 2024 11:47 AM Leila BROCKS wrote: Reason for CRM: Patient [phone] states cpap machine is messed up again, it's the mask gasket. Patient states Dr. Tamea ordered one for her before. Patient thinks her DME is [facility]. Please advise and call back.

## 2024-03-28 ENCOUNTER — Other Ambulatory Visit: Payer: Self-pay | Admitting: Pulmonary Disease

## 2024-04-05 ENCOUNTER — Ambulatory Visit: Admitting: Internal Medicine

## 2024-04-05 ENCOUNTER — Encounter: Payer: Self-pay | Admitting: Internal Medicine

## 2024-04-05 VITALS — BP 122/62 | HR 73 | Ht 63.0 in | Wt 176.4 lb

## 2024-04-05 DIAGNOSIS — J4540 Moderate persistent asthma, uncomplicated: Secondary | ICD-10-CM

## 2024-04-05 DIAGNOSIS — D469 Myelodysplastic syndrome, unspecified: Secondary | ICD-10-CM

## 2024-04-05 DIAGNOSIS — J479 Bronchiectasis, uncomplicated: Secondary | ICD-10-CM

## 2024-04-05 DIAGNOSIS — Z1231 Encounter for screening mammogram for malignant neoplasm of breast: Secondary | ICD-10-CM

## 2024-04-05 NOTE — Progress Notes (Signed)
 Subjective:  Patient ID: Sarah [name], female    DOB: 07/01/1950  Age: 73 y.o. MRN: [id number]  CC: There were no encounter diagnoses.   HPI STEFAN KAREN presents for No chief complaint on file.  MDS:  her counts have been  stable,  hgb 11.7   ASTHMA iiPROVING.  Was able to walk a mile on the beach recently during a vacation to [place] with Lenore . Using Trelegy daily ,  does not use albuterol  not even once a week    ENERGY LEVEL IMPROVED. Appetite is good,  eating iron  rich foods.    Outpatient Medications Prior to Visit  Medication Sig Dispense Refill   albuterol  (VENTOLIN  HFA) 108 (90 Base) MCG/ACT inhaler Inhale 1-2 puffs into the lungs every 6 (six) hours as needed for wheezing or shortness of breath. 8.5 g 11   amLODipine  (NORVASC ) 5 MG tablet Take 0.5 tablets (2.5 mg total) by mouth daily.     azithromycin  (ZITHROMAX ) 250 MG tablet TAKE ONE TABLET BY MOUTH THREE TIMES A WEEK M-W-F 12 each 3   Calcium  Citrate-Vitamin D  (CALCIUM  CITRATE + D PO) Take 1 tablet by mouth 2 (two) times daily.     citalopram  (CELEXA ) 20 MG tablet TAKE 1 TABLET BY MOUTH DAILY 100 tablet 2   ferrous sulfate  324 MG TBEC Take 1 tablet (324 mg total) by mouth daily with breakfast. 65 mg of elemental iron      fexofenadine (ALLEGRA) 180 MG tablet Take 180 mg by mouth daily.     Fluticasone -Umeclidin-Vilant (TRELEGY ELLIPTA ) 200-62.5-25 MCG/ACT AEPB Inhale 1 puff into the lungs daily. 28 each 10   gabapentin  (NEURONTIN ) 100 MG capsule TAKE 1 CAPSULE BY MOUTH 3 TIMES  DAILY 300 capsule 2   levothyroxine  (SYNTHROID ) 50 MCG tablet TAKE 1 TABLET BY MOUTH DAILY ON  AN EMPTY STOMACH WITH A GLASS OF WATER  AT LEAST 30 TO 60 MINUTES  BEFORE BREAKFAST 100 tablet 2   oxybutynin  (DITROPAN  XL) 15 MG 24 hr tablet Take 1 tablet (15 mg total) by mouth at bedtime. 30 tablet 2   pantoprazole  (PROTONIX ) 40 MG tablet Take 1 tablet (40 mg total) by mouth daily. 30 tablet 0   rosuvastatin  (CRESTOR ) 10 MG  tablet Take 1 tablet (10 mg total) by mouth daily. 100 tablet 3   traZODone  (DESYREL ) 50 MG tablet TAKE 1/2 TO 1 TABLET BY MOUTH AT BEDTIME AS NEEDED FOR SLEEP 90 tablet 3   vitamin B-12 (CYANOCOBALAMIN ) 1000 MCG tablet Take 1,000 mcg by mouth daily.     No facility-administered medications prior to visit.    Review of Systems;  Patient denies headache, fevers, malaise, unintentional weight loss, skin rash, eye pain, sinus congestion and sinus pain, sore throat, dysphagia,  hemoptysis , cough, dyspnea, wheezing, chest pain, palpitations, orthopnea, edema, abdominal pain, nausea, melena, diarrhea, constipation, flank pain, dysuria, hematuria, urinary  Frequency, nocturia, numbness, tingling, seizures,  Focal weakness, Loss of consciousness,  Tremor, insomnia, depression, anxiety, and suicidal ideation.      Objective:  There were no vitals taken for this visit.  BP Readings from Last 3 Encounters:  03/14/24 116/64  03/05/24 125/72  02/05/24 116/60    Wt Readings from Last 3 Encounters:  03/14/24 178 lb (80.7 kg)  03/05/24 175 lb 12.8 oz (79.7 kg)  02/05/24 174 lb 3.2 oz (79 kg)    Physical Exam Vitals reviewed.  Constitutional:      General: She is not in acute distress.    Appearance:  Normal appearance. She is normal weight. She is not ill-appearing, toxic-appearing or diaphoretic.  HENT:     Head: Normocephalic.  Eyes:     General: No scleral icterus.       Right eye: No discharge.        Left eye: No discharge.     Conjunctiva/sclera: Conjunctivae normal.  Cardiovascular:     Rate and Rhythm: Normal rate and regular rhythm.     Heart sounds: Normal heart sounds.  Pulmonary:     Effort: Pulmonary effort is normal. No respiratory distress.     Breath sounds: Normal breath sounds.  Musculoskeletal:        General: Normal range of motion.  Skin:    General: Skin is warm and dry.  Neurological:     General: No focal deficit present.     Mental Status: She is alert  and oriented to person, place, and time. Mental status is at baseline.  Psychiatric:        Mood and Affect: Mood normal.        Behavior: Behavior normal.        Thought Content: Thought content normal.        Judgment: Judgment normal.    Lab Results  Component Value Date   HGBA1C 4.8 01/15/2024   HGBA1C 5.9 11/21/2022   HGBA1C 6.3 (H) 08/15/2022    Lab Results  Component Value Date   CREATININE 0.81 03/05/2024   CREATININE 0.69 01/30/2024   CREATININE 0.76 01/22/2024    Lab Results  Component Value Date   WBC 5.9 03/05/2024   HGB 11.7 (L) 03/05/2024   HCT 35.8 (L) 03/05/2024   PLT 196 03/05/2024   GLUCOSE 69 (L) 03/05/2024   CHOL 152 11/21/2022   TRIG 52 01/16/2024   HDL 72.40 11/21/2022   LDLDIRECT 58.0 11/21/2022   LDLCALC 68 11/21/2022   ALT 12 03/05/2024   AST 12 (L) 03/05/2024   NA 138 03/05/2024   K 3.7 03/05/2024   CL 102 03/05/2024   CREATININE 0.81 03/05/2024   BUN 14 03/05/2024   CO2 28 03/05/2024   TSH 1.669 01/13/2024   INR 1.1 08/16/2022   HGBA1C 4.8 01/15/2024    CT CHEST HIGH RESOLUTION Result Date: 03/16/2024 CLINICAL DATA:  Bronchiectasis, asthma, aspiration. EXAM: CT CHEST WITHOUT CONTRAST TECHNIQUE: Multidetector CT imaging of the chest was performed following the standard protocol without intravenous contrast. High resolution imaging of the lungs, as well as inspiratory and expiratory imaging, was performed. RADIATION DOSE REDUCTION: This exam was performed according to the departmental dose-optimization program which includes automated exposure control, adjustment of the mA and/or kV according to patient size and/or use of iterative reconstruction technique. COMPARISON:  01/15/2024 and 08/15/2022. FINDINGS: Cardiovascular: Atherosclerotic calcification of the aorta and coronary arteries. Enlarged pulmonic trunk and heart. Trace pericardial effusion. Mediastinum/Nodes: No pathologically enlarged mediastinal or axillary lymph nodes. Hilar regions  are difficult to definitively evaluate without IV contrast. Esophagus is grossly unremarkable. Lungs/Pleura: Negative for subpleural reticulation, traction bronchiectasis/bronchiolectasis, ground glass, architectural distortion or honeycombing. Calcified granulomas. No pleural fluid. Airway is unremarkable. There is air trapping. No excessive dynamic airway collapse. Upper Abdomen: Partially imaged low-attenuation lesions in the kidneys. No specific follow-up necessary. Visualized portions of the liver, gallbladder, adrenal glands, kidneys, spleen, pancreas, stomach and bowel are otherwise grossly unremarkable. No upper abdominal adenopathy. Musculoskeletal: Degenerative changes in the spine. Flowing anterior osteophytosis in the thoracic spine. Partially imaged cervical fusion hardware. IMPRESSION: 1. Negative for interstitial lung disease. Best Boy  trapping is indicative of small airways disease. 2. Trace pericardial effusion, decreased. 3. Aortic atherosclerosis (ICD10-I70.0). Coronary artery calcification. 4. Enlarged pulmonic trunk, indicative of pulmonary arterial hypertension. Electronically Signed   By: Newell Eke M.D.   On: 03/16/2024 15:11    Assessment & Plan:  .There are no diagnoses linked to this encounter.   I spent 34 minutes on the day of this face to face encounter reviewing patient's  most recent visit with cardiology,  nephrology,  and neurology,  prior relevant surgical and non surgical procedures, recent  labs and imaging studies, counseling on weight management,  reviewing the assessment and plan with patient, and post visit ordering and reviewing of  diagnostics and therapeutics with patient  .   Follow-up: No follow-ups on file.   Verneita LITTIE Kettering, MD

## 2024-04-05 NOTE — Patient Instructions (Addendum)
 Your annual mammogram has been ordered AND IS  OVERDUE   [name] will not allow us  to schedule it for you,  so please  call to make your appointment [phone]    I hope you have a joyful and peaceful Christmas!  If you can find it,  I highly recommend the movie  It's a Wonderful Life    by [employer]   stars Sarah [name]  and Sarah [name]

## 2024-04-07 ENCOUNTER — Encounter: Payer: Self-pay | Admitting: Internal Medicine

## 2024-04-07 NOTE — Assessment & Plan Note (Signed)
 Reviewed  the diagnosis to patient and reinforced need to continue azithromycin  MWF per Dr Tamea

## 2024-04-07 NOTE — Assessment & Plan Note (Signed)
 Symptomss are controoled with Trilegy ; she has had infrequent use of albuterol 

## 2024-04-07 NOTE — Assessment & Plan Note (Signed)
 Hgb has been above the threshold for transufions and stable. , Managed by Oncology,  next appt August  Lab Results  Component Value Date   WBC 5.9 03/05/2024   HGB 11.7 (L) 03/05/2024   HCT 35.8 (L) 03/05/2024   MCV 99.4 03/05/2024   PLT 196 03/05/2024

## 2024-05-04 ENCOUNTER — Other Ambulatory Visit: Payer: Self-pay | Admitting: Internal Medicine

## 2024-05-04 DIAGNOSIS — I10 Essential (primary) hypertension: Secondary | ICD-10-CM

## 2024-05-06 ENCOUNTER — Encounter: Payer: Self-pay | Admitting: Internal Medicine

## 2024-05-06 ENCOUNTER — Inpatient Hospital Stay: Admitting: Internal Medicine

## 2024-05-06 ENCOUNTER — Inpatient Hospital Stay: Attending: Internal Medicine

## 2024-05-06 VITALS — BP 126/71 | HR 62 | Temp 96.6°F | Resp 16 | Ht 63.0 in | Wt 177.5 lb

## 2024-05-06 DIAGNOSIS — Z9841 Cataract extraction status, right eye: Secondary | ICD-10-CM | POA: Diagnosis not present

## 2024-05-06 DIAGNOSIS — Z79890 Hormone replacement therapy (postmenopausal): Secondary | ICD-10-CM | POA: Insufficient documentation

## 2024-05-06 DIAGNOSIS — Z9842 Cataract extraction status, left eye: Secondary | ICD-10-CM | POA: Diagnosis not present

## 2024-05-06 DIAGNOSIS — Z90710 Acquired absence of both cervix and uterus: Secondary | ICD-10-CM | POA: Diagnosis not present

## 2024-05-06 DIAGNOSIS — J45909 Unspecified asthma, uncomplicated: Secondary | ICD-10-CM | POA: Insufficient documentation

## 2024-05-06 DIAGNOSIS — Z79899 Other long term (current) drug therapy: Secondary | ICD-10-CM | POA: Diagnosis not present

## 2024-05-06 DIAGNOSIS — M2550 Pain in unspecified joint: Secondary | ICD-10-CM | POA: Diagnosis not present

## 2024-05-06 DIAGNOSIS — E785 Hyperlipidemia, unspecified: Secondary | ICD-10-CM | POA: Diagnosis not present

## 2024-05-06 DIAGNOSIS — D539 Nutritional anemia, unspecified: Secondary | ICD-10-CM | POA: Diagnosis not present

## 2024-05-06 DIAGNOSIS — D46Z Other myelodysplastic syndromes: Secondary | ICD-10-CM

## 2024-05-06 DIAGNOSIS — Z833 Family history of diabetes mellitus: Secondary | ICD-10-CM | POA: Diagnosis not present

## 2024-05-06 DIAGNOSIS — I10 Essential (primary) hypertension: Secondary | ICD-10-CM | POA: Diagnosis not present

## 2024-05-06 DIAGNOSIS — D469 Myelodysplastic syndrome, unspecified: Secondary | ICD-10-CM | POA: Diagnosis not present

## 2024-05-06 DIAGNOSIS — Z8249 Family history of ischemic heart disease and other diseases of the circulatory system: Secondary | ICD-10-CM | POA: Insufficient documentation

## 2024-05-06 DIAGNOSIS — Z8701 Personal history of pneumonia (recurrent): Secondary | ICD-10-CM | POA: Insufficient documentation

## 2024-05-06 DIAGNOSIS — F32A Depression, unspecified: Secondary | ICD-10-CM | POA: Diagnosis not present

## 2024-05-06 DIAGNOSIS — E039 Hypothyroidism, unspecified: Secondary | ICD-10-CM | POA: Diagnosis not present

## 2024-05-06 DIAGNOSIS — Z83438 Family history of other disorder of lipoprotein metabolism and other lipidemia: Secondary | ICD-10-CM | POA: Diagnosis not present

## 2024-05-06 DIAGNOSIS — Z856 Personal history of leukemia: Secondary | ICD-10-CM | POA: Diagnosis not present

## 2024-05-06 LAB — CMP (CANCER CENTER ONLY)
ALT: 10 U/L (ref 0–44)
AST: 13 U/L — ABNORMAL LOW (ref 15–41)
Albumin: 4.3 g/dL (ref 3.5–5.0)
Alkaline Phosphatase: 91 U/L (ref 38–126)
Anion gap: 10 (ref 5–15)
BUN: 20 mg/dL (ref 8–23)
CO2: 26 mmol/L (ref 22–32)
Calcium: 9.7 mg/dL (ref 8.9–10.3)
Chloride: 104 mmol/L (ref 98–111)
Creatinine: 0.85 mg/dL (ref 0.44–1.00)
GFR, Estimated: >60 mL/min
Glucose, Bld: 116 mg/dL — ABNORMAL HIGH (ref 70–99)
Potassium: 4 mmol/L (ref 3.5–5.1)
Sodium: 140 mmol/L (ref 135–145)
Total Bilirubin: 0.4 mg/dL (ref 0.0–1.2)
Total Protein: 7.1 g/dL (ref 6.5–8.1)

## 2024-05-06 LAB — CBC WITH DIFFERENTIAL (CANCER CENTER ONLY)
Abs Immature Granulocytes: 0.01 K/uL (ref 0.00–0.07)
Basophils Absolute: 0.1 K/uL (ref 0.0–0.1)
Basophils Relative: 1 %
Eosinophils Absolute: 0.2 K/uL (ref 0.0–0.5)
Eosinophils Relative: 3 %
HCT: 38.5 % (ref 36.0–46.0)
Hemoglobin: 12.8 g/dL (ref 12.0–15.0)
Immature Granulocytes: 0 %
Lymphocytes Relative: 29 %
Lymphs Abs: 1.7 K/uL (ref 0.7–4.0)
MCH: 31.1 pg (ref 26.0–34.0)
MCHC: 33.2 g/dL (ref 30.0–36.0)
MCV: 93.4 fL (ref 80.0–100.0)
Monocytes Absolute: 0.5 K/uL (ref 0.1–1.0)
Monocytes Relative: 8 %
Neutro Abs: 3.5 K/uL (ref 1.7–7.7)
Neutrophils Relative %: 59 %
Platelet Count: 140 K/uL — ABNORMAL LOW (ref 150–400)
RBC: 4.12 MIL/uL (ref 3.87–5.11)
RDW: 14.2 % (ref 11.5–15.5)
WBC Count: 5.9 K/uL (ref 4.0–10.5)
nRBC: 0 % (ref 0.0–0.2)

## 2024-05-06 NOTE — Assessment & Plan Note (Signed)
#[  FEB 2023] Mild macrocytic anemia/thrombocytopenia-hemoglobin around 11-12; platelets 127-140s.  Normal white count. December 2023-bone marrow biopsy -Hypercellular bone marrow for age with dyspoietic changes; no blasts noted. cytogenetics/FISH positive for 5 q. Deletion.    # Poor tolerance to revlimid - sec to hospital admission- [Aspiration pneumonia- COPD with acute exacerbation]- continue to HOLD revlimid  given improvement; however will continue active monitoring.   # Nonobstructive CAD/ Recent [facility] CTA showed mild nonobstructive CAD [[facility]]- ok with asprin 81mg /day. Stable.   # Hx of B12 deficiency- on B12 supplementation-2024 Elevated B12- recommend B12 every other day. Stable.   # DISPOSITION:  # Follow up in 3 months-- - MD;labs- cbc/cmp; LDH- iron  studies; ferritin; B12-   Dr.B

## 2024-05-06 NOTE — Progress Notes (Signed)
 No concerns today.

## 2024-05-06 NOTE — Progress Notes (Signed)
 Sarah [name]  Patient Care Team: [name], MD as PCP - General (Internal Medicine) [name], MD as PCP - Cardiology (Cardiology) [name], MD (Internal Medicine) [name], [name], MD (General Surgery) [name], MD as Consulting Physician (Oncology) Aundria Ladell POUR, MD as Consulting Physician (Gastroenterology)  CHIEF COMPLAINTS/PURPOSE OF CONSULTATION: MDS  HEMATOLOGY HISTORY: Oncology History Overview [name]  DEC 2023- DIAGNOSIS: LOW GRADE MDS- 5 Q DEL;   # MACROCYTIC ANEMIA [since 2022- April hb 11; MCV-103; platelet- 120s] EGD/ colonoscopy >>> 10 years ago; OCT 2022-slight improvement blood counts/stable; hepatitis HIV negative.  Myeloma panel negative.   BONE MARROW, ASPIRATE, CLOT, CORE: -Hypercellular bone marrow for age with dyspoietic changes -See comment  PERIPHERAL BLOOD: -Macrocytic anemia -Thrombocytopenia  COMMENT:  The overall findings are very concerning for involvement by a low-grade myelodysplastic syndrome.  The differential diagnosis includes changes related to nutritional deficiency, medication, immune mediated process, infection, etc. Correlation with cytogenetic and FISH studies is recommended.  MICROSCOPIC DESCRIPTION:  PERIPHERAL BLOOD SMEAR: The red blood cells display mild anisopoikilocytosis with mild polychromasia.  The white blood cells are normal in number with occasional hypogranular and/or hypolobated neutrophils.  Scattered reactive appearing lymphocytes are present.  The platelets are decreased in number.  BONE MARROW ASPIRATE: Bone marrow particles present. Erythroid precursors: Progressive maturation with occasional late precursors displaying nuclear cytoplasmic dyssynchrony. Granulocytic precursors: Progressive maturation with many mature neutrophils displaying hypogranulation and/or hypolobation.  No increase in blastic cells identified. Megakaryocytes: Abundant with  many small and/or hypolobated/unilobated forms Lymphocytes/plasma cells: Large aggregates not present  TOUCH PREPARATIONS: A mixture of cell types present.  CLOT AND BIOPSY: The sections show variable cellularity ranging from 10% focally to 70% with a mixture of cell types.  Expansile sheets of blastic cells are not identified.  A small interstitial lymphoid aggregate composed of small lymphoid cells is seen.  IRON  STAIN: Iron  stains are performed on a bone marrow aspirate or touch imprint smear and section of clot. The controls stained appropriately.       Storage Iron :  Abundant      Ring Sideroblasts: Absent  ADDITIONAL DATA/TESTING: The specimen was sent for cytogenetic analysis and FISH for MDS and a separate report will follow. Flow cytometric analysis (WLS [id number]) was performed and shows T cells with nonspecific changes.  No monoclonal B-cell population or significant CD34 positive blastic population identified.  # LATE AUG 2025- start rev 10 mg- 3 w/1 w     #Incidental right hepatic lobe 13 mm hypoechoic lesion [ultrasound-October 2022]-MRI OCT 2022-   Low clinical concern for metastatic disease; however need to rule out primary HCC versus others.  1. There are 3 small liver lesions, 2 of which represent simple cysts. 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 MAY 2023-abdominal MRI -benign cyst no further surveillance recommended.   #October 2022-ultrasound incidental bilateral mild to moderate hydronephrosis.-Rs/p evaluation with Dr.Brandon urology.     MDS (myelodysplastic syndrome) (HCC)  04/20/2022 Initial Diagnosis   MDS (myelodysplastic syndrome), low grade (HCC)    HISTORY OF PRESENTING ILLNESS: Ambulating independently.  Alone.   Sarah [name] 74 y.o.  female is here for follow-up today re: low grade MDS with 5 q del is here for a follow up.  Discussed the use of AI scribe  software for clinical [name] transcription with the patient, who gave verbal consent to proceed.  History of Present Illness  Sarah [name] is a 74 year old female with myelodysplastic syndrome who presents for routine hematology follow-up.  She previously received Revlimid , which was discontinued after she developed pneumonia. Since discontinuing Revlimid , she has not experienced any new infections or complications.  She reports feeling well overall, with improvement in her well-being and satisfaction with her current blood counts. She reports that her blood counts are higher than she has been in recent years. She denies new symptoms, including infection, bleeding, or bruising. She maintains her usual diet, including salads, red meat, and beet supplements, and attributes her improved well-being in part to a recent trip to the beach.  She endorses mild memory difficulties and relies on her friend Sarah [name] for support during appointments. Sarah [name] recently sustained a ligament injury and underwent surgery; she is recovering well, and Sarah [name] is currently providing care for her. No other concerns or complaints were raised during the visit.        Review of Systems  Constitutional:  Negative for chills, diaphoresis, fever and weight loss.  HENT:  Negative for nosebleeds and sore throat.   Eyes:  Negative for double vision.  Respiratory:  Negative for cough, hemoptysis, sputum production, shortness of breath and wheezing.   Cardiovascular:  Negative for chest pain, palpitations, orthopnea and leg swelling.  Gastrointestinal:  Negative for abdominal pain, blood in stool, constipation, diarrhea, heartburn, melena, nausea and vomiting.  Genitourinary:  Negative for dysuria, frequency and urgency.  Musculoskeletal:  Positive for joint pain. Negative for back pain.  Skin: Negative.  Negative for itching and rash.  Neurological:  Negative for dizziness, tingling, focal weakness, weakness and headaches.   Psychiatric/Behavioral:  Negative for depression. The patient is not nervous/anxious and does not have insomnia.     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   Wheezing     SURGICAL HISTORY: Past Surgical History:  Procedure Laterality Date   ABDOMINAL HYSTERECTOMY  04/26/1987   ANTERIOR CERVICAL DECOMPRESSION/DISCECTOMY FUSION 4 LEVELS N/A 11/10/2021   Procedure: C3-7 ANTERIOR CERVICAL DISCECTOMY AND FUSION (GLOBUS FORGE);  Surgeon: Clois Fret, MD;  Location: [facility];  Service: Neurosurgery;  Laterality: N/A;   BROW LIFT Bilateral 02/07/2020   Procedure: BLEPHAROPLASTY UPPER EYELID; W/EXCESS SKIN BROW PTOSIS REPAIR BILATERAL;  Surgeon: Ashley Greig HERO, MD;  Location: [facility];  Service: Ophthalmology;  Laterality: Bilateral;   CATARACT EXTRACTION W/PHACO Right 09/13/2016   Procedure: CATARACT EXTRACTION PHACO AND INTRAOCULAR LENS PLACEMENT (IOC);  Surgeon: Jaye Fallow, MD;  Location: [facility];  Service: Ophthalmology;  Laterality: Right;  US  00:38 AP% 14.3 CDE 5.51 Fluid pack lot # [id number]   CATARACT EXTRACTION W/PHACO Left 10/11/2016   Procedure: CATARACT EXTRACTION PHACO AND INTRAOCULAR LENS PLACEMENT (IOC);  Surgeon: Jaye Fallow, MD;  Location: [facility];  Service: Ophthalmology;  Laterality: Left;  US  00:30 AP% 11.3 CDE 3.50 fluid pack lot # [id number]   COLONOSCOPY WITH PROPOFOL  N/A 09/30/2014   Procedure: COLONOSCOPY WITH PROPOFOL ;  Surgeon: Reyes LELON Cota, MD;  Location: [facility];  Service: Endoscopy;  Laterality: N/A;   COLONOSCOPY  WITH PROPOFOL  N/A 03/24/2021   Procedure: COLONOSCOPY WITH PROPOFOL ;  Surgeon: Unk Corinn Skiff, MD;  Location: [facility];  Service: Gastroenterology;  Laterality: N/A;   ESOPHAGOGASTRODUODENOSCOPY N/A 09/30/2014   Procedure: ESOPHAGOGASTRODUODENOSCOPY (EGD);  Surgeon: Reyes LELON Cota, MD;  Location: [facility];  Service: Endoscopy;  Laterality: N/A;   ESOPHAGOGASTRODUODENOSCOPY N/A 03/24/2021   Procedure: ESOPHAGOGASTRODUODENOSCOPY (EGD);  Surgeon: Unk Corinn Skiff, MD;  Location: [facility];  Service: Gastroenterology;  Laterality: N/A;   ESOPHAGOGASTRODUODENOSCOPY N/A 01/22/2024   Procedure: EGD (ESOPHAGOGASTRODUODENOSCOPY);  Surgeon: Jinny Carmine, MD;  Location: [facility] END[facility];  Service: Endoscopy;  Laterality: N/A;   FOOT SURGERY Bilateral 04/26/1983   KNEE SURGERY  04/25/2014    SOCIAL HISTORY: Social History   Socioeconomic History   Marital status: Single    Spouse name: Not on file   Number of children: Not on file   Years of education: Not on file   Highest education level: Not on file  Occupational History   Not on file  Tobacco Use   Smoking status: Never   Smokeless tobacco: Never  Vaping Use   Vaping status: Never Used  Substance and Sexual Activity   Alcohol use: No   Drug use: No   Sexual activity: Not Currently  Other Topics Concern   Not on file  Social History Narrative   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 No smoking; no alcohol. Lives in [place], [place] lives self/kitty.    Social Drivers of Health   Tobacco Use: Low Risk (05/06/2024)   Patient History    Smoking Tobacco Use: Never    Smokeless Tobacco Use: Never    Passive Exposure: Not on file  Financial Resource Strain: Low Risk  (03/28/2024)   Received from [facility]   Overall Financial Resource Strain (CARDIA)    Difficulty of Paying Living Expenses: Not hard at all  Food Insecurity: No Food Insecurity (03/28/2024)   Received from [facility]   Epic    Within the past 12 months, you worried that your food would run out before you got the money to buy more.: Never true    Within the past 12 months, the food you bought just didn't last and you didn't have money to get more.: Never true  Transportation Needs: No Transportation Needs (03/28/2024)   Received from [facility] - Transportation    In the past 12 months, has lack of transportation kept you from medical appointments or from getting medications?: No    Lack of Transportation (Non-Medical): No  Physical Activity: Inactive (05/15/2023)   Exercise Vital Sign    Days of Exercise per Week: 0 days    Minutes of Exercise per Session: 0 min  Stress: No Stress Concern Present (05/15/2023)   [employer] of [employer]ational [employer] - Occupational Stress Questionnaire    Feeling of Stress : Not at all  Social Connections: Moderately Isolated (01/12/2024)   Social Connection and Isolation Panel    Frequency of Communication with Friends and Family: Once a week    Frequency of Social Gatherings with Friends and Family: Once a week    Attends Religious Services: 1 to 4 times per year    Active Member of [employer] or Organizations: Yes    Attends Banker Meetings: 1 to 4 times per year    Marital Status: Never married  Intimate Partner Violence: Not At Risk (01/24/2024)   Epic  Fear of Current or Ex-Partner: No    Emotionally Abused: No    Physically Abused: No    Sexually Abused: No  Depression (PHQ2-9): Low Risk (05/06/2024)   Depression (PHQ2-9)    PHQ-2 Score: 0  Alcohol Screen: Low Risk (05/15/2023)   Alcohol Screen    Last Alcohol Screening Score (AUDIT): 0  Housing: Unknown (03/28/2024)   Received from [facility]   Epic    In the last 12 months, was there a time when you were not able to pay the mortgage or rent on time?: No    Number of Times Moved in the Last Year: Not on file    At any time in the  past 12 months, were you homeless or living in a shelter (including now)?: No  Utilities: Not At Risk (03/28/2024)   Received from [facility]   Epic    In the past 12 months has the electric, gas, oil, or water  company threatened to shut off services in your home?: No  Health Literacy: Adequate Health Literacy (09/12/2023)   B1300 Health Literacy    Frequency of need for help with medical instructions: Never    FAMILY HISTORY: Family History  Problem Relation Age of Onset   Diabetes Mother    Hyperlipidemia Mother    Heart disease Mother    Prostate cancer Neg Hx    Kidney cancer Neg Hx    Bladder Cancer Neg Hx     ALLERGIES:  has no known allergies.  MEDICATIONS:  Current Outpatient Medications  Medication Sig Dispense Refill   albuterol  (VENTOLIN  HFA) 108 (90 Base) MCG/ACT inhaler Inhale 1-2 puffs into the lungs every 6 (six) hours as needed for wheezing or shortness of breath. 8.5 g 11   amLODipine  (NORVASC ) 5 MG tablet Take 0.5 tablets (2.5 mg total) by mouth daily.     azithromycin  (ZITHROMAX ) 250 MG tablet TAKE ONE TABLET BY MOUTH THREE TIMES A WEEK M-W-F 12 each 3   Calcium  Citrate-Vitamin D  (CALCIUM  CITRATE + D PO) Take 1 tablet by mouth 2 (two) times daily.     citalopram  (CELEXA ) 20 MG tablet TAKE 1 TABLET BY MOUTH DAILY 100 tablet 2   ferrous sulfate  324 MG TBEC Take 1 tablet (324 mg total) by mouth daily with breakfast. 65 mg of elemental iron      fexofenadine (ALLEGRA) 180 MG tablet Take 180 mg by mouth daily.     Fluticasone -Umeclidin-Vilant (TRELEGY ELLIPTA ) 200-62.5-25 MCG/ACT AEPB Inhale 1 puff into the lungs daily. 28 each 10   gabapentin  (NEURONTIN ) 100 MG capsule TAKE 1 CAPSULE BY MOUTH 3 TIMES  DAILY 300 capsule 2   levothyroxine  (SYNTHROID ) 50 MCG tablet TAKE 1 TABLET BY MOUTH DAILY ON  AN EMPTY STOMACH WITH A GLASS OF WATER  AT LEAST 30 TO 60 MINUTES  BEFORE BREAKFAST 100 tablet 2   oxybutynin  (DITROPAN  XL) 15 MG 24 hr tablet Take 1 tablet  (15 mg total) by mouth at bedtime. 30 tablet 2   pantoprazole  (PROTONIX ) 40 MG tablet Take 1 tablet (40 mg total) by mouth daily. 30 tablet 0   rosuvastatin  (CRESTOR ) 10 MG tablet Take 1 tablet (10 mg total) by mouth daily. 100 tablet 3   traZODone  (DESYREL ) 50 MG tablet TAKE 1/2 TO 1 TABLET BY MOUTH AT BEDTIME AS NEEDED FOR SLEEP 90 tablet 3   vitamin B-12 (CYANOCOBALAMIN ) 1000 MCG tablet Take 1,000 mcg by mouth daily.     No current facility-administered medications for this  visit.      PHYSICAL EXAMINATION:   Vitals:   05/06/24 0950  BP: 126/71  Pulse: 62  Resp: 16  Temp: (!) 96.6 F (35.9 C)  SpO2: 98%     Filed Weights   05/06/24 0950  Weight: 177 lb 8 oz (80.5 kg)      Physical Exam Vitals and nursing [name] reviewed.  HENT:     Head: Normocephalic and atraumatic.     Mouth/Throat:     Pharynx: Oropharynx is clear.  Eyes:     Extraocular Movements: Extraocular movements intact.     Pupils: Pupils are equal, round, and reactive to light.  Cardiovascular:     Rate and Rhythm: Normal rate and regular rhythm.  Pulmonary:     Comments: Decreased breath sounds bilaterally.  Abdominal:     Palpations: Abdomen is soft.  Musculoskeletal:        General: Normal range of motion.     Cervical back: Normal range of motion.  Skin:    General: Skin is warm.  Neurological:     General: No focal deficit present.     Mental Status: She is alert and oriented to person, place, and time.  Psychiatric:        Behavior: Behavior normal.        Judgment: Judgment normal.     LABORATORY DATA:  I have reviewed the data as listed Lab Results  Component Value Date   WBC 5.9 05/06/2024   HGB 12.8 05/06/2024   HCT 38.5 05/06/2024   MCV 93.4 05/06/2024   PLT 140 (L) 05/06/2024   Recent Labs    01/17/24 0241 01/18/24 0524 01/19/24 0355 01/20/24 0340 01/22/24 0410 01/30/24 1057 03/05/24 1355  NA 141   < > 142  --  140 137 138  K 3.5   < > 3.5  --  3.8 3.5 3.7  CL  99   < > 100  --  107 106 102  CO2 30   < > 32  --  27 24 28   GLUCOSE 118*   < > 155*  --  111* 107* 69*  BUN 21   < > 23  --  15 17 14   CREATININE 1.35*   < > 1.10*   < > 0.76 0.69 0.81  CALCIUM  8.6*   < > 8.2*  --  8.4* 9.2 9.1  GFRNONAA 41*   < > 53*   < > >60 >60 >60  PROT 6.0*  --   --   --   --  7.0 6.9  ALBUMIN 2.8*   < > 2.6*  --   --  3.6 3.8  AST 17  --   --   --   --  16 12*  ALT 11  --   --   --   --  15 12  ALKPHOS 44  --   --   --   --  58 65  BILITOT 1.0  --   --   --   --  0.7 0.6   < > = values in this interval not displayed.     No results found.   MDS (myelodysplastic syndrome) (HCC) #[FEB 2023] Mild macrocytic anemia/thrombocytopenia-hemoglobin around 11-12; platelets 127-140s.  Normal white count. December 2023-bone marrow biopsy -Hypercellular bone marrow for age with dyspoietic changes; no blasts noted. cytogenetics/FISH positive for 5 q. Deletion.    # Poor tolerance to revlimid - sec to hospital admission- [Aspiration pneumonia- COPD with acute  exacerbation]- continue to HOLD revlimid  given improvement; however will continue active monitoring.   # Nonobstructive CAD/ Recent Cardia CTA showed mild nonobstructive CAD [[facility]]- ok with asprin 81mg /day. Stable.   # Hx of B12 deficiency- on B12 supplementation-2024 Elevated B12- recommend B12 every other day. Stable.   # DISPOSITION:  # Follow up in 3 months-- - MD;labs- cbc/cmp; LDH- iron  studies; ferritin; B12-   Dr.B     All questions were answered. The patient knows to call the clinic with any problems, questions or concerns.    Cindy JONELLE Joe, MD 05/06/2024 10:26 AM

## 2024-05-10 ENCOUNTER — Ambulatory Visit: Attending: Cardiology | Admitting: Cardiology

## 2024-05-10 ENCOUNTER — Encounter: Payer: Self-pay | Admitting: Cardiology

## 2024-05-10 VITALS — BP 126/62 | HR 60 | Ht 63.0 in | Wt 175.2 lb

## 2024-05-10 DIAGNOSIS — I10 Essential (primary) hypertension: Secondary | ICD-10-CM

## 2024-05-10 DIAGNOSIS — E7800 Pure hypercholesterolemia, unspecified: Secondary | ICD-10-CM

## 2024-05-10 DIAGNOSIS — R0609 Other forms of dyspnea: Secondary | ICD-10-CM

## 2024-05-10 NOTE — Progress Notes (Signed)
 " Cardiology Office Note:    Date:  05/10/2024   ID:  Sarah [name], DOB Nov 22, 1950, MRN [id number]  PCP:  [name], MD   [facility] Providers Cardiologist:  Redell Cave, MD     Referring MD: [name], MD   Chief Complaint  Patient presents with   Follow-up    6 month follow up visit. Patient states that she feels good on today. Meds reviewed.     History of Present Illness:    Sarah [name] is a 74 y.o. female with a hx of hypertension, hyperlipidemia, hypothyroidism, asthma presenting for follow-up.  Previously seen with symptoms of shortness of breath ongoing over 10 years.  Repeat echocardiogram showed normal systolic function with no significant structural abnormalities.  Diagnosed with anemia, low-grade MDS with hemoglobin as low as 7.  Initially managed with Revlimid , stopped due to poor tolerance.  Thankfully most recent CBC obtained 4 days ago showed normalized hemoglobin levels.  Endorses breathing better, shortness of breath is much improved.  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   Wheezing     Past Surgical History:  Procedure Laterality Date   ABDOMINAL HYSTERECTOMY  04/26/1987   ANTERIOR CERVICAL DECOMPRESSION/DISCECTOMY FUSION 4 LEVELS N/A 11/10/2021   Procedure: C3-7 ANTERIOR CERVICAL DISCECTOMY AND FUSION (GLOBUS FORGE);  Surgeon: Clois Fret, MD;  Location: [facility];  Service: Neurosurgery;  Laterality: N/A;   BROW  LIFT Bilateral 02/07/2020   Procedure: BLEPHAROPLASTY UPPER EYELID; W/EXCESS SKIN BROW PTOSIS REPAIR BILATERAL;  Surgeon: Ashley Greig HERO, MD;  Location: [facility];  Service: Ophthalmology;  Laterality: Bilateral;   CATARACT EXTRACTION W/PHACO Right 09/13/2016   Procedure: CATARACT EXTRACTION PHACO AND INTRAOCULAR LENS PLACEMENT (IOC);  Surgeon: Jaye Fallow, MD;  Location: [facility];  Service: Ophthalmology;  Laterality: Right;  US  00:38 AP% 14.3 CDE 5.51 Fluid pack lot # [id number]   CATARACT EXTRACTION W/PHACO Left 10/11/2016   Procedure: CATARACT EXTRACTION PHACO AND INTRAOCULAR LENS PLACEMENT (IOC);  Surgeon: Jaye Fallow, MD;  Location: [facility];  Service: Ophthalmology;  Laterality: Left;  US  00:30 AP% 11.3 CDE 3.50 fluid pack lot # [id number]   COLONOSCOPY WITH PROPOFOL  N/A 09/30/2014   Procedure: COLONOSCOPY WITH PROPOFOL ;  Surgeon: Reyes LELON Cota, MD;  Location: [facility];  Service: Endoscopy;  Laterality: N/A;   COLONOSCOPY WITH PROPOFOL  N/A 03/24/2021   Procedure: COLONOSCOPY WITH PROPOFOL ;  Surgeon: Unk Corinn Skiff, MD;  Location: [facility] END[facility]COPY;  Service: Gastroenterology;  Laterality: N/A;   ESOPHAGOGASTRODUODENOSCOPY N/A 09/30/2014   Procedure: ESOPHAGOGASTRODUODENOSCOPY (EGD);  Surgeon: Reyes LELON Cota, MD;  Location: [facility];  Service: Endoscopy;  Laterality: N/A;   ESOPHAGOGASTRODUODENOSCOPY N/A 03/24/2021   Procedure: ESOPHAGOGASTRODUODENOSCOPY (EGD);  Surgeon: Unk Corinn Skiff, MD;  Location[facility];  Service: Gastroenterology;  Laterality: N/A;   ESOPHAGOGASTRODUODENOSCOPY N/A 01/22/2024   Procedure: EGD (ESOPHAGOGASTRODUODENOSCOPY);  Surgeon: Jinny Carmine, MD;  Location: [facility] END[facility]COPY;  Service:  Endoscopy;  Laterality: N/A;   FOOT SURGERY Bilateral 04/26/1983   KNEE SURGERY  04/25/2014    Current Medications: Current Meds  Medication Sig   albuterol  (VENTOLIN  HFA) 108 (90 Base) MCG/ACT inhaler Inhale 1-2 puffs into the  lungs every 6 (six) hours as needed for wheezing or shortness of breath.   amLODipine  (NORVASC ) 5 MG tablet TAKE 1 TABLET BY MOUTH DAILY   azithromycin  (ZITHROMAX ) 250 MG tablet TAKE ONE TABLET BY MOUTH THREE TIMES A WEEK M-W-F   Calcium  Citrate-Vitamin D  (CALCIUM  CITRATE + D PO) Take 1 tablet by mouth 2 (two) times daily.   citalopram  (CELEXA ) 20 MG tablet TAKE 1 TABLET BY MOUTH DAILY   ferrous sulfate  324 MG TBEC Take 1 tablet (324 mg total) by mouth daily with breakfast. 65 mg of elemental iron    fexofenadine (ALLEGRA) 180 MG tablet Take 180 mg by mouth daily.   Fluticasone -Umeclidin-Vilant (TRELEGY ELLIPTA ) 200-62.5-25 MCG/ACT AEPB Inhale 1 puff into the lungs daily.   gabapentin  (NEURONTIN ) 100 MG capsule TAKE 1 CAPSULE BY MOUTH 3 TIMES  DAILY   levothyroxine  (SYNTHROID ) 50 MCG tablet TAKE 1 TABLET BY MOUTH DAILY ON  AN EMPTY STOMACH WITH A GLASS OF WATER  AT LEAST 30 TO 60 MINUTES  BEFORE BREAKFAST   oxybutynin  (DITROPAN  XL) 15 MG 24 hr tablet Take 1 tablet (15 mg total) by mouth at bedtime.   pantoprazole  (PROTONIX ) 40 MG tablet TAKE 1 TABLET BY MOUTH DAILY   rosuvastatin  (CRESTOR ) 10 MG tablet Take 1 tablet (10 mg total) by mouth daily.   traZODone  (DESYREL ) 50 MG tablet TAKE 1/2 TO 1 TABLET BY MOUTH AT BEDTIME AS NEEDED FOR SLEEP   vitamin B-12 (CYANOCOBALAMIN ) 1000 MCG tablet Take 1,000 mcg by mouth daily.     Allergies:   Patient has no known allergies.   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 No smoking; no alcohol. Lives in [place], [place] lives self/kitty.    Social Drivers of Health   Tobacco Use: Low Risk (05/10/2024)   Patient History    Smoking Tobacco Use: Never    Smokeless Tobacco Use: Never    Passive Exposure: Not on file  Financial Resource Strain: Low Risk  (03/28/2024)   Received from [facility]   Overall Financial Resource Strain (CARDIA)    Difficulty of Paying Living Expenses: Not hard at all  Food Insecurity: No Food Insecurity (03/28/2024)   Received from [facility]   Epic    Within the past 12 months, you worried that your food would run out before you got the money to buy more.: Never true    Within the past 12 months, the food you bought just didn't last and you didn't have money to get more.: Never true  Transportation Needs: No Transportation Needs (03/28/2024)   Received from [facility]  PRAPARE - Transportation    In the past 12 months, has lack of transportation kept you from medical appointments or from getting medications?: No    Lack of Transportation (Non-Medical): No  Physical Activity: Inactive (05/15/2023)   Exercise Vital Sign    Days of Exercise per Week: 0 days    Minutes of Exercise per Session: 0 min  Stress: No Stress Concern Present (05/15/2023)   [employer] of Occupational Health - Occupational Stress Questionnaire    Feeling of Stress : Not at all  Social Connections: Moderately Isolated (01/12/2024)   Social Connection and Isolation Panel    Frequency of Communication with Friends and Family: Once a week    Frequency of Social Gatherings with Friends and Family: Once a week    Attends Religious Services: 1 to 4 times per year    Active Member of [employer] or Organizations: Yes    Attends Banker Meetings: 1 to 4 times per year    Marital Status: Never married  Depression (PHQ2-9): Low Risk (05/06/2024)   Depression (PHQ2-9)     PHQ-2 Score: 0  Alcohol Screen: Low Risk (05/15/2023)   Alcohol Screen    Last Alcohol Screening Score (AUDIT): 0  Housing: Unknown (03/28/2024)   Received from [facility]   Epic    In the last 12 months, was there a time when you were not able to pay the mortgage or rent on time?: No    Number of Times Moved in the Last Year: Not on file    At any time in the past 12 months, were you homeless or living in a shelter (including now)?: No  Utilities: Not At Risk (03/28/2024)   Received from [facility]   Epic    In the past 12 months has the electric, gas, oil, or water  company threatened to shut off services in your home?: No  Health Literacy: Adequate Health Literacy (09/12/2023)   B1300 Health Literacy    Frequency of need for help with medical instructions: Never     Family History: The patient's family history includes Diabetes in her mother; Heart disease in her mother; Hyperlipidemia in her mother. There is no history of Prostate cancer, Kidney cancer, or Bladder Cancer.  ROS:   Please see the history of present illness.     All other systems reviewed and are negative.  EKGs/Labs/Other Studies Reviewed:    The following studies were reviewed today:  EKG obtained today shows normal sinus rhythm, normal ECG. EKG Interpretation Date/Time:  Friday May 10 2024 15:14:53 EST Ventricular Rate:  60 PR Interval:  92 QRS Duration:  140 QT Interval:  444 QTC Calculation: 444 R Axis:   117  Text Interpretation: Sinus rhythm with short PR Right bundle branch block Confirmed by [name] ([id number]) on 05/10/2024 3:34:00 PM    Recent Labs: 01/13/2024: TSH 1.669 01/15/2024: B Natriuretic Peptide 115.1 01/19/2024: Magnesium 2.2 05/06/2024: ALT 10; BUN 20; Creatinine 0.85; Hemoglobin 12.8; Platelet Count 140; Potassium 4.0; Sodium 140  Recent Lipid Panel    Component Value Date/Time   CHOL 152 11/21/2022 1406   TRIG 52 01/16/2024 0459   HDL  72.40 11/21/2022 1406   CHOLHDL 2 11/21/2022 1406   VLDL 11.6 11/21/2022 1406   LDLCALC 68 11/21/2022 1406   LDLCALC 142 (H) 04/06/2018 1520   LDLDIRECT 58.0 11/21/2022 1406     Risk Assessment/Calculations:  Physical Exam:    VS:  BP 126/62 (BP Location: Left Arm, Patient Position: Sitting, Cuff Size: Large)   Pulse 60   Ht 5' 3 (1.6 m)   Wt 175 lb 3.2 oz (79.5 kg)   SpO2 93%   BMI 31.04 kg/m     Wt Readings from Last 3 Encounters:  05/10/24 175 lb 3.2 oz (79.5 kg)  05/06/24 177 lb 8 oz (80.5 kg)  04/05/24 176 lb 6.4 oz (80 kg)     GEN:  Well nourished, well developed in no acute distress HEENT: Normal NECK: No JVD; No carotid bruits CARDIAC: RRR, no murmurs, rubs, gallops RESPIRATORY:  Clear to auscultation without rales, wheezing or rhonchi  ABDOMEN: Soft, non-tender, non-distended MUSCULOSKELETAL:  No edema; No deformity  SKIN: Warm and dry NEUROLOGIC:  Alert and oriented x 3 PSYCHIATRIC:  Normal affect   ASSESSMENT:    1. DOE (dyspnea on exertion)   2. Primary hypertension   3. Pure hypercholesterolemia    PLAN:    In order of problems listed above:  Dyspnea on exertion, echo 9/25 EF 65 to 70%.  Etiology likely from anemia.  Resolved with normalization of hemoglobin.  Management of MDS as per hematology.  Hypertension, BP controlled, continue Norvasc  5 mg daily. Hyperlipidemia, cholesterol controlled, continue Crestor  10 mg daily.  Follow-up as needed      Medication Adjustments/Labs and Tests Ordered: Current medicines are reviewed at length with the patient today.  Concerns regarding medicines are outlined above.  Orders Placed This Encounter  Procedures   EKG 12-Lead   No orders of the defined types were placed in this encounter.   Patient Instructions  Medication Instructions:  Your physician recommends that you continue on your current medications as directed. Please refer to the Current Medication list given to you today.    *If you need a refill on your cardiac medications before your next appointment, please call your pharmacy*  Lab Work: No labs ordered today  If you have labs (blood work) drawn today and your tests are completely normal, you will receive your results only by: MyChart Message (if you have MyChart) OR A paper copy in the mail If you have any lab test that is abnormal or we need to change your treatment, we will call you to review the results.  Testing/Procedures: No test ordered today   Follow-Up: At [facility], you and your health needs are our priority.  As part of our continuing mission to provide you with exceptional heart care, our providers are all part of one team.  This team includes your primary Cardiologist (physician) and Advanced Practice Providers or APPs (Physician Assistants and Nurse Practitioners) who all work together to provide you with the care you need, when you need it.  Your next appointment:   As needed             Signed, Redell Cave, MD  05/10/2024 4:18 PM    [facility] "

## 2024-05-10 NOTE — Patient Instructions (Signed)

## 2024-05-14 ENCOUNTER — Ambulatory Visit: Admitting: Anesthesiology

## 2024-05-14 ENCOUNTER — Ambulatory Visit
Admission: RE | Admit: 2024-05-14 | Discharge: 2024-05-14 | Disposition: A | Discharge status: Home / Self Care | Attending: Gastroenterology | Admitting: Gastroenterology

## 2024-05-14 ENCOUNTER — Other Ambulatory Visit: Payer: Self-pay

## 2024-05-14 ENCOUNTER — Encounter
Admission: RE | Disposition: A | Discharge status: Home / Self Care | Payer: Self-pay | Source: Home / Self Care | Attending: Gastroenterology

## 2024-05-14 ENCOUNTER — Encounter: Payer: Self-pay | Admitting: Gastroenterology

## 2024-05-14 DIAGNOSIS — D122 Benign neoplasm of ascending colon: Secondary | ICD-10-CM | POA: Diagnosis not present

## 2024-05-14 DIAGNOSIS — I10 Essential (primary) hypertension: Secondary | ICD-10-CM | POA: Insufficient documentation

## 2024-05-14 DIAGNOSIS — F32A Depression, unspecified: Secondary | ICD-10-CM | POA: Diagnosis not present

## 2024-05-14 DIAGNOSIS — J45909 Unspecified asthma, uncomplicated: Secondary | ICD-10-CM | POA: Diagnosis not present

## 2024-05-14 DIAGNOSIS — D120 Benign neoplasm of cecum: Secondary | ICD-10-CM | POA: Insufficient documentation

## 2024-05-14 DIAGNOSIS — Z79899 Other long term (current) drug therapy: Secondary | ICD-10-CM | POA: Diagnosis not present

## 2024-05-14 DIAGNOSIS — K219 Gastro-esophageal reflux disease without esophagitis: Secondary | ICD-10-CM | POA: Insufficient documentation

## 2024-05-14 DIAGNOSIS — D124 Benign neoplasm of descending colon: Secondary | ICD-10-CM | POA: Insufficient documentation

## 2024-05-14 DIAGNOSIS — F419 Anxiety disorder, unspecified: Secondary | ICD-10-CM | POA: Insufficient documentation

## 2024-05-14 DIAGNOSIS — Z1211 Encounter for screening for malignant neoplasm of colon: Secondary | ICD-10-CM | POA: Diagnosis present

## 2024-05-14 DIAGNOSIS — I2510 Atherosclerotic heart disease of native coronary artery without angina pectoris: Secondary | ICD-10-CM | POA: Diagnosis not present

## 2024-05-14 DIAGNOSIS — G4730 Sleep apnea, unspecified: Secondary | ICD-10-CM | POA: Insufficient documentation

## 2024-05-14 HISTORY — PX: COLONOSCOPY: SHX5424

## 2024-05-14 HISTORY — PX: HEMOSTASIS CLIP PLACEMENT: SHX6857

## 2024-05-14 HISTORY — DX: Sleep apnea, unspecified: G47.30

## 2024-05-14 HISTORY — PX: POLYPECTOMY: SHX149

## 2024-05-14 MED ORDER — SODIUM CHLORIDE 0.9 % IV SOLN: INTRAVENOUS | Status: DC

## 2024-05-14 MED ORDER — PROPOFOL 500 MG/50ML IV EMUL
INTRAVENOUS | Status: DC | PRN
  Administered 2024-05-14: 110 ug/kg/min via INTRAVENOUS

## 2024-05-14 MED ORDER — PROPOFOL 10 MG/ML IV BOLUS
INTRAVENOUS | Status: DC | PRN
  Administered 2024-05-14: 80 mg via INTRAVENOUS
  Administered 2024-05-14: 20 mg via INTRAVENOUS

## 2024-05-14 MED ORDER — LIDOCAINE HCL (CARDIAC) PF 100 MG/5ML IV SOSY
PREFILLED_SYRINGE | INTRAVENOUS | Status: DC | PRN
  Administered 2024-05-14: 50 mg via INTRAVENOUS

## 2024-05-14 NOTE — Anesthesia Preprocedure Evaluation (Signed)
 "                                  Anesthesia Evaluation  Patient identified by MRN, date of birth, ID band Patient awake    Reviewed: Allergy & Precautions, H&P , NPO status , Patient's Chart, lab work & pertinent test results, reviewed documented beta blocker date and time   Airway Mallampati: II   Neck ROM: full    Dental  (+) Poor Dentition   Pulmonary shortness of breath and with exertion, asthma , sleep apnea and Continuous Positive Airway Pressure Ventilation , pneumonia, resolved   Pulmonary exam normal        Cardiovascular Exercise Tolerance: Poor hypertension, On Medications + CAD  Normal cardiovascular exam Rhythm:regular Rate:Normal     Neuro/Psych  PSYCHIATRIC DISORDERS Anxiety Depression    negative neurological ROS     GI/Hepatic Neg liver ROS,GERD  Medicated,,  Endo/Other  Hypothyroidism    Renal/GU negative Renal ROS  negative genitourinary   Musculoskeletal   Abdominal   Peds  Hematology  (+) Blood dyscrasia, anemia   Anesthesia Other Findings Past Medical History: No date: Arthritis 01/18/2024: Aspiration pneumonia (HCC) No date: Asthma 08/11/2022: CAP (community acquired pneumonia) No date: Depression No date: Dyspnea     Comment:  with heat No date: Environmental and seasonal allergies No date: GERD (gastroesophageal reflux disease) No date: Hyperlipidemia No date: Hypertension No date: Hypothyroidism No date: Leukemia (HCC) No date: Presence of dental prosthetic device     Comment:  Dental implants 01/12/2024: Proctocolitis No date: Sleep apnea No date: Wheezing Past Surgical History: 04/26/1987: ABDOMINAL HYSTERECTOMY 11/10/2021: ANTERIOR CERVICAL DECOMPRESSION/DISCECTOMY FUSION 4  LEVELS; N/A     Comment:  Procedure: C3-7 ANTERIOR CERVICAL DISCECTOMY AND FUSION               (GLOBUS FORGE);  Surgeon: Clois Fret, MD;                Location: [facility];  Service: Neurosurgery;  Laterality:                N/A; 02/07/2020: BROW LIFT; Bilateral     Comment:  Procedure: BLEPHAROPLASTY UPPER EYELID; W/EXCESS SKIN               BROW PTOSIS REPAIR BILATERAL;  Surgeon: Ashley Greig HERO,               MD;  Location: [facility];  Service:               Ophthalmology;  Laterality: Bilateral; 09/13/2016: CATARACT EXTRACTION W/PHACO; Right     Comment:  Procedure: CATARACT EXTRACTION PHACO AND INTRAOCULAR               LENS PLACEMENT (IOC);  Surgeon: Jaye Fallow, MD;                Location: [facility];  Service: Ophthalmology;  Laterality:              Right;  US  00:38 AP% 14.3 CDE 5.51 Fluid pack lot #               [id number] 10/11/2016: CATARACT EXTRACTION W/PHACO; Left     Comment:  Procedure: CATARACT EXTRACTION PHACO AND INTRAOCULAR               LENS PLACEMENT (IOC);  Surgeon: Jaye Fallow, MD;  Location: [facility];  Service: Ophthalmology;  Laterality:              Left;  US  00:30 AP% 11.3 CDE 3.50 fluid pack lot #               [id number] 09/30/2014: COLONOSCOPY WITH PROPOFOL ; N/A     Comment:  Procedure: COLONOSCOPY WITH PROPOFOL ;  Surgeon: Reyes LELON Cota, MD;  Location: [facility] ENDOSCOPY;  Service:               Endoscopy;  Laterality: N/A; 03/24/2021: COLONOSCOPY WITH PROPOFOL ; N/A     Comment:  Procedure: COLONOSCOPY WITH PROPOFOL ;  Surgeon: Unk Corinn Skiff, MD;  Location: [facility] END[facility]COPY;  Service:               Gastroenterology;  Laterality: N/A; 09/30/2014: ESOPHAGOGASTRODUODENOSCOPY; N/A     Comment:  Procedure: ESOPHAGOGASTRODUODENOSCOPY (EGD);  Surgeon:               Reyes LELON Cota, MD;  Location: [facility] ENDOSCOPY;                Service: Endoscopy;  Laterality: N/A; 03/24/2021: ESOPHAGOGASTRODUODENOSCOPY; N/A     Comment:  Procedure: ESOPHAGOGASTRODUODENOSCOPY (EGD);  Surgeon:               Unk Corinn Skiff, MD;  Location: [facility] ENDOSCOPY;                Service: Gastroenterology;  Laterality: N/A; 01/22/2024:  ESOPHAGOGASTRODUODENOSCOPY; N/A     Comment:  Procedure: EGD (ESOPHAGOGASTRODUODENOSCOPY);  Surgeon:               Jinny Carmine, MD;  Location: [facility] ENDOSCOPY;  Service:               Endoscopy;  Laterality: N/A; 04/26/1983: FOOT SURGERY; Bilateral 04/25/2014: KNEE SURGERY   Reproductive/Obstetrics negative OB ROS                              Anesthesia Physical Anesthesia Plan  ASA: 3  Anesthesia Plan: General   Post-op Pain Management:    Induction:   PONV Risk Score and Plan:   Airway Management Planned:   Additional Equipment:   Intra-op Plan:   Post-operative Plan:   Informed Consent: I have reviewed the patients History and Physical, chart, labs and discussed the procedure including the risks, benefits and alternatives for the proposed anesthesia with the patient or authorized representative who has indicated his/her understanding and acceptance.     Dental Advisory Given  Plan Discussed with: CRNA  Anesthesia Plan Comments:         Anesthesia Quick Evaluation  "

## 2024-05-14 NOTE — H&P (Signed)
 "   Corinn JONELLE Brooklyn, MD [facility], DHIP [address]  [place], [place] [address]  Main: [phone] Fax:  [phone] Pager: [phone]   Primary Care Physician:  [name], MD Primary Gastroenterologist:  Dr. Corinn JONELLE Brooklyn  Pre-Procedure History & Physical: HPI:  Sarah [name] is a 74 y.o. female is here for an colonoscopy.   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   Sleep apnea    Wheezing     Past Surgical History:  Procedure Laterality Date   ABDOMINAL HYSTERECTOMY  04/26/1987   ANTERIOR CERVICAL DECOMPRESSION/DISCECTOMY FUSION 4 LEVELS N/A 11/10/2021   Procedure: C3-7 ANTERIOR CERVICAL DISCECTOMY AND FUSION (GLOBUS FORGE);  Surgeon: Clois Fret, MD;  Location: [facility] ORS;  Service: Neurosurgery;  Laterality: N/A;   BROW LIFT Bilateral 02/07/2020   Procedure: BLEPHAROPLASTY UPPER EYELID; W/EXCESS SKIN BROW PTOSIS REPAIR BILATERAL;  Surgeon: Ashley Greig HERO, MD;  Location: [facility];  Service: Ophthalmology;  Laterality: Bilateral;   CATARACT EXTRACTION W/PHACO Right 09/13/2016   Procedure: CATARACT EXTRACTION PHACO AND INTRAOCULAR LENS PLACEMENT (IOC);  Surgeon: Jaye Fallow, MD;  Location: [facility];  Service: Ophthalmology;  Laterality: Right;  US  00:38 AP% 14.3 CDE 5.51 Fluid pack lot # [id number]   CATARACT EXTRACTION W/PHACO Left 10/11/2016   Procedure: CATARACT EXTRACTION PHACO AND INTRAOCULAR LENS PLACEMENT (IOC);  Surgeon: Jaye Fallow, MD;  Location: [facility];  Service: Ophthalmology;  Laterality: Left;  US  00:30 AP%  11.3 CDE 3.50 fluid pack lot # [id number]   COLONOSCOPY WITH PROPOFOL  N/A 09/30/2014   Procedure: COLONOSCOPY WITH PROPOFOL ;  Surgeon: Reyes LELON Cota, MD;  Location: [facility] ENDOSCOPY;  Service: Endoscopy;  Laterality: N/A;   COLONOSCOPY WITH PROPOFOL  N/A 03/24/2021   Procedure: COLONOSCOPY WITH PROPOFOL ;  Surgeon: Brooklyn Corinn Skiff, MD;  Location: [facility] END[facility]COPY;  Service: Gastroenterology;  Laterality: N/A;   ESOPHAGOGASTRODUODENOSCOPY N/A 09/30/2014   Procedure: ESOPHAGOGASTRODUODENOSCOPY (EGD);  Surgeon: Reyes LELON Cota, MD;  Location: [facility] END[facility]COPY;  Service: Endoscopy;  Laterality: N/A;   ESOPHAGOGASTRODUODENOSCOPY N/A 03/24/2021   Procedure: ESOPHAGOGASTRODUODENOSCOPY (EGD);  Surgeon: Brooklyn Corinn Skiff, MD;  Location: [facility] ENDOSCOPY;  Service: Gastroenterology;  Laterality: N/A;   ESOPHAGOGASTRODUODENOSCOPY N/A 01/22/2024   Procedure: EGD (ESOPHAGOGASTRODUODENOSCOPY);  Surgeon: Jinny Carmine, MD;  Location: [facility] ENDOSCOPY;  Service: Endoscopy;  Laterality: N/A;   FOOT SURGERY Bilateral 04/26/1983   KNEE SURGERY  04/25/2014    Prior to Admission medications  Medication Sig Start Date End Date Taking? Authorizing Provider  albuterol  (VENTOLIN  HFA) 108 (90 Base) MCG/ACT inhaler Inhale 1-2 puffs into the lungs every 6 (six) hours as needed for wheezing or shortness of breath. 05/05/23   [name], MD  amLODipine  (NORVASC ) 5 MG tablet TAKE 1 TABLET BY MOUTH DAILY 05/08/24   [name], MD  azithromycin  (ZITHROMAX ) 250 MG tablet TAKE ONE TABLET BY MOUTH THREE TIMES A WEEK M-W-F 03/28/24   Tamea Dedra CROME, MD  Calcium  Citrate-Vitamin D  (CALCIUM  CITRATE + D PO) Take 1 tablet by mouth 2 (two) times daily.  [provider]  citalopram  (CELEXA ) 20 MG tablet TAKE 1 TABLET BY MOUTH DAILY 12/26/23   [name], MD  ferrous sulfate  324 MG TBEC Take 1 tablet (324 mg total) by mouth daily with breakfast. 65 mg of elemental iron  01/22/24   Josette Ade, MD  fexofenadine  (ALLEGRA) 180 MG tablet Take 180 mg by mouth daily.    [provider]  Fluticasone -Umeclidin-Vilant (TRELEGY ELLIPTA ) 200-62.5-25 MCG/ACT AEPB Inhale 1 puff into the lungs daily. 03/11/24   Tamea Dedra CROME, MD  gabapentin  (NEURONTIN ) 100 MG capsule TAKE 1 CAPSULE BY MOUTH 3 TIMES  DAILY 11/10/23   [name], MD  levothyroxine  (SYNTHROID ) 50 MCG tablet TAKE 1 TABLET BY MOUTH DAILY ON  AN EMPTY STOMACH WITH A GLASS OF WATER  AT LEAST 30 TO 60 MINUTES  BEFORE BREAKFAST 05/08/24   [name], MD  oxybutynin  (DITROPAN  XL) 15 MG 24 hr tablet Take 1 tablet (15 mg total) by mouth at bedtime. 02/01/24   [name], MD  pantoprazole  (PROTONIX ) 40 MG tablet TAKE 1 TABLET BY MOUTH DAILY 05/08/24   [name], MD  rosuvastatin  (CRESTOR ) 10 MG tablet Take 1 tablet (10 mg total) by mouth daily. 02/01/24   [name], MD  traZODone  (DESYREL ) 50 MG tablet TAKE 1/2 TO 1 TABLET BY MOUTH AT BEDTIME AS NEEDED FOR SLEEP 07/24/23   [name], MD  vitamin B-12 (CYANOCOBALAMIN ) 1000 MCG tablet Take 1,000 mcg by mouth daily.    [provider]    Allergies as of 03/28/2024   (No Known Allergies)    Family History  Problem Relation Age of Onset   Diabetes Mother    Hyperlipidemia Mother    Heart disease Mother    Prostate cancer Neg Hx    Kidney cancer Neg Hx    Bladder Cancer Neg Hx     Social History   Socioeconomic History   Marital status: Single    Spouse name: Not on file   Number of children: Not on file   Years of education: Not on file   Highest education level: Not on file  Occupational History   Not on file  Tobacco Use   Smoking status: Never   Smokeless tobacco: Never  Vaping Use   Vaping status: Never Used  Substance and Sexual Activity   Alcohol use: No   Drug use: No   Sexual activity: Not Currently  Other Topics Concern   Not on file  Social History Narrative   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 No smoking; no alcohol.  Lives in [place], [place] lives self/kitty.    Social Drivers of Health   Tobacco Use: Low Risk (05/14/2024)   Patient History    Smoking Tobacco Use: Never    Smokeless Tobacco Use: Never    Passive Exposure: Not on file  Financial Resource Strain: Low Risk  (03/28/2024)   Received from [facility]   Overall Financial Resource Strain (CARDIA)    Difficulty of Paying Living Expenses: Not hard at all  Food Insecurity: No Food Insecurity (03/28/2024)   Received from [facility]   Epic    Within the past 12 months, you worried that your food would run out before you got the money to buy more.: Never true    Within the past 12 months, the food you bought just didn't last and you didn't have money to get more.: Never true  Transportation Needs: No Transportation  Needs (03/28/2024)   Received from [facility] - Transportation    In the past 12 months, has lack of transportation kept you from medical appointments or from getting medications?: No    Lack of Transportation (Non-Medical): No  Physical Activity: Inactive (05/15/2023)   Exercise Vital Sign    Days of Exercise per Week: 0 days    Minutes of Exercise per Session: 0 min  Stress: No Stress Concern Present (05/15/2023)   [employer] of Occupational [employer] - Occupational Stress Questionnaire    Feeling of Stress : Not at all  Social Connections: Moderately Isolated (01/12/2024)   Social Connection and Isolation Panel    Frequency of Communication with Friends and Family: Once a week    Frequency of Social Gatherings with Friends and Family: Once a week    Attends Religious Services: 1 to 4 times per year    Active Member of [employer] or Organizations: Yes    Attends Banker Meetings: 1 to 4 times per year    Marital Status: Never married  Intimate Partner Violence: Not At Risk (01/24/2024)   Epic    Fear of Current or Ex-Partner: No    Emotionally Abused: No     Physically Abused: No    Sexually Abused: No  Depression (PHQ2-9): Low Risk (05/06/2024)   Depression (PHQ2-9)    PHQ-2 Score: 0  Alcohol Screen: Low Risk (05/15/2023)   Alcohol Screen    Last Alcohol Screening Score (AUDIT): 0  Housing: Unknown (03/28/2024)   Received from [facility]   Epic    In the last 12 months, was there a time when you were not able to pay the mortgage or rent on time?: No    Number of Times Moved in the Last Year: Not on file    At any time in the past 12 months, were you homeless or living in a shelter (including now)?: No  Utilities: Not At Risk (03/28/2024)   Received from [facility]   Epic    In the past 12 months has the electric, gas, oil, or water  company threatened to shut off services in your home?: No  Health Literacy: Adequate Health Literacy (09/12/2023)   B1300 Health Literacy    Frequency of need for help with medical instructions: Never    Review of Systems: See HPI, otherwise negative ROS  Physical Exam: BP (!) 142/69   Pulse 80   Temp (!) 96.7 F (35.9 C) (Temporal)   Resp 20   Ht 5' 3 (1.6 m)   Wt 171 lb (77.6 kg)   SpO2 97%   BMI 30.29 kg/m  General:   Alert,  pleasant and cooperative in NAD Head:  Normocephalic and atraumatic. Neck:  Supple; no masses or thyromegaly. Lungs:  Clear throughout to auscultation.    Heart:  Regular rate and rhythm. Abdomen:  Soft, nontender and nondistended. Normal bowel sounds, without guarding, and without rebound.   Neurologic:  Alert and  oriented x4;  grossly normal neurologically.  Impression/Plan: KEOSHA ROSSA is here for an colonoscopy to be performed for Personal history of adenomatous and serrated colon polyps   Risks, benefits, limitations, and alternatives regarding  colonoscopy have been reviewed with the patient.  Questions have been answered.  All parties agreeable.   Corinn Brooklyn, MD  05/14/2024, 8:06 AM "

## 2024-05-14 NOTE — Transfer of Care (Signed)
 Immediate Anesthesia Transfer of Care Note  Patient: Sarah [name]  Procedure(s) Performed: COLONOSCOPY POLYPECTOMY, INTESTINE  Patient Location: PACU and Endoscopy Unit  Anesthesia Type:General  Level of Consciousness: drowsy and patient cooperative  Airway & Oxygen Therapy: Patient Spontanous Breathing  Post-op Assessment: Report given to RN and Post -op Vital signs reviewed and stable  Post vital signs: Reviewed and stable  Last Vitals:  Vitals Value Taken Time  BP 134/65 05/14/24 08:40  Temp 35.7 C 05/14/24 08:40  Pulse 77 05/14/24 08:40  Resp 16 05/14/24 08:40  SpO2 97 % 05/14/24 08:40    Last Pain:  Vitals:   05/14/24 0840  TempSrc: Tympanic  PainSc: 0-No pain         Complications: No notable events documented.

## 2024-05-14 NOTE — Op Note (Signed)
 [facility] Gastroenterology Patient Name: Sarah [name] Procedure Date: 05/14/2024 8:00 AM MRN: [id number] Account #: [id number] Date of Birth: Sep 08, 1950 Admit Type: Outpatient Age: 74 Room: [facility] ENDO ROOM 3 Gender: Female Note Status: Finalized Instrument Name: Colon Scope [id number] Procedure:             Colonoscopy Indications:           Surveillance: Personal history of adenomatous polyps                         on last colonoscopy 3 years ago, Last colonoscopy:                         November 2022 Providers:             Corinn Jess Brooklyn MD, MD Referring MD:          Verneita Kettering, MD (Referring MD) Medicines:             General Anesthesia Complications:         No immediate complications. Estimated blood loss:                         Minimal. Procedure:             Pre-Anesthesia Assessment:                        - Prior to the procedure, a History and Physical was                         performed, and patient medications and allergies were                         reviewed. The patient is competent. The risks and                         benefits of the procedure and the sedation options and                         risks were discussed with the patient. All questions                         were answered and informed consent was obtained.                         Patient identification and proposed procedure were                         verified by the physician, the nurse, the                         anesthesiologist, the anesthetist and the technician                         in the pre-procedure area in the procedure room in the                         endoscopy suite. Mental Status Examination: alert and  oriented. Airway Examination: normal oropharyngeal                         airway and neck mobility. Respiratory Examination:                         clear to auscultation. CV Examination: normal.                          Prophylactic Antibiotics: The patient does not require                         prophylactic antibiotics. Prior Anticoagulants: The                         patient has taken no anticoagulant or antiplatelet                         agents. ASA Grade Assessment: III - A patient with                         severe systemic disease. After reviewing the risks and                         benefits, the patient was deemed in satisfactory                         condition to undergo the procedure. The anesthesia                         plan was to use general anesthesia. Immediately prior                         to administration of medications, the patient was                         re-assessed for adequacy to receive sedatives. The                         heart rate, respiratory rate, oxygen saturations,                         blood pressure, adequacy of pulmonary ventilation, and                         response to care were monitored throughout the                         procedure. The physical status of the patient was                         re-assessed after the procedure.                        After obtaining informed consent, the colonoscope was                         passed under direct vision. Throughout the procedure,  the patient's blood pressure, pulse, and oxygen                         saturations were monitored continuously. The                         Colonoscope was introduced through the anus and                         advanced to the the cecum, identified by appendiceal                         orifice and ileocecal valve. The colonoscopy was                         performed without difficulty. The patient tolerated                         the procedure well. The quality of the bowel                         preparation was evaluated using the BBPS [facility] Bowel                         Preparation Scale) with scores of: Right Colon = 3,                          Transverse Colon = 3 and Left Colon = 3 (entire mucosa                         seen well with no residual staining, small fragments                         of stool or opaque liquid). The total BBPS score                         equals 9. The ileocecal valve, appendiceal orifice,                         and rectum were photographed. Findings:      The perianal and digital rectal examinations were normal. Pertinent       negatives include normal sphincter tone and no palpable rectal lesions.      Two sessile polyps were found in the ascending colon and cecum. The       polyps were diminutive in size. These polyps were removed with a jumbo       cold forceps. Resection and retrieval were complete. Estimated blood       loss: none.      A 5 mm polyp was found in the descending colon. The polyp was sessile.       The polyp was removed with a cold snare. Resection and retrieval were       complete. Estimated blood loss was minimal. To prevent bleeding after       the polypectomy, one hemostatic clip was successfully placed (MR safe).       Clip manufacturer: [employer]. There was no bleeding during, or       at the end,  of the procedure.      The retroflexed view of the distal rectum and anal verge was normal and       showed no anal or rectal abnormalities. Impression:            - Two diminutive polyps in the ascending colon and in                         the cecum, removed with a jumbo cold forceps. Resected                         and retrieved.                        - One 5 mm polyp in the descending colon, removed with                         a cold snare. Resected and retrieved. Clip                         manufacturer: [employer]. Clip (MR safe) was                         placed.                        - The distal rectum and anal verge are normal on                         retroflexion view. Recommendation:        - Discharge patient to home (with escort).                         - Resume previous diet today.                        - Continue present medications.                        - Await pathology results.                        - Repeat colonoscopy in 5 to 7 years for surveillance                         based on pathology results. Procedure Code(s):     --- Professional ---                        [phone], Colonoscopy, flexible; with removal of                         tumor(s), polyp(s), or other lesion(s) by snare                         technique                        [id number], 59, Colonoscopy, flexible; with biopsy, single  or multiple Diagnosis Code(s):     --- Professional ---                        Z86.010, Personal history of colonic polyps                        D12.2, Benign neoplasm of ascending colon                        D12.0, Benign neoplasm of cecum                        D12.4, Benign neoplasm of descending colon CPT copyright 2022 American Medical Association. All rights reserved. The codes documented in this report are preliminary and upon coder review may  be revised to meet current compliance requirements. Dr. Corinn Brooklyn Corinn Jess Brooklyn MD, MD 05/14/2024 8:38:03 AM This report has been signed electronically. Number of Addenda: 0 Note Initiated On: 05/14/2024 8:00 AM Scope Withdrawal Time: 0 hours 15 minutes 56 seconds  Total Procedure Duration: 0 hours 19 minutes 54 seconds  Estimated Blood Loss:  Estimated blood loss was minimal.      [facility]

## 2024-05-14 NOTE — Anesthesia Postprocedure Evaluation (Signed)
"   Anesthesia Post Note  Patient: Sarah [name]  Procedure(s) Performed: COLONOSCOPY POLYPECTOMY, INTESTINE  Patient location during evaluation: PACU Anesthesia Type: General Level of consciousness: awake and alert Pain management: pain level controlled Vital Signs Assessment: post-procedure vital signs reviewed and stable Respiratory status: spontaneous breathing, nonlabored ventilation, respiratory function stable and patient connected to nasal cannula oxygen Cardiovascular status: blood pressure returned to baseline and stable Postop Assessment: no apparent nausea or vomiting Anesthetic complications: no   No notable events documented.   Last Vitals:  Vitals:   05/14/24 0840 05/14/24 0851  BP: 134/65 137/65  Pulse: 77 71  Resp: 16 (!) 21  Temp: (!) 35.7 C   SpO2: 97% 100%    Last Pain:  Vitals:   05/14/24 0851  TempSrc:   PainSc: 0-No pain                 Lynwood KANDICE Clause      "

## 2024-05-15 ENCOUNTER — Ambulatory Visit: Payer: Medicare Other

## 2024-05-15 LAB — SURGICAL PATHOLOGY

## 2024-05-16 ENCOUNTER — Ambulatory Visit: Payer: Self-pay | Admitting: Gastroenterology

## 2024-05-28 ENCOUNTER — Telehealth: Payer: Self-pay | Admitting: Internal Medicine

## 2024-05-28 ENCOUNTER — Telehealth: Payer: Self-pay

## 2024-05-28 MED ORDER — TRELEGY ELLIPTA 200-62.5-25 MCG/ACT IN AEPB: 1.0000 | INHALATION_SPRAY | Freq: Every day | RESPIRATORY_TRACT | 0 refills | Status: AC

## 2024-05-28 NOTE — Telephone Encounter (Signed)
 I can leave one sample at the desk for her. She will need to contact her insurance to see what a cheaper alternative to the Trelegy would be. And let us  know so we can send it in.  LMTCB. E2C2 please advise when patient calls back.

## 2024-05-28 NOTE — Telephone Encounter (Signed)
 Copied from CRM [id number]. Topic: Clinical - Prescription Issue >> May 28, 2024 10:57 AM Leotis ORN wrote: Patient returning missed call. Read Tully note verbatim. Advised patient that the front desk has one Trelegy sample available for pickup. Informed patient that once she contacts her insurance company to request a cheaper alternative, she should call back with the medication name so it can be sent in. Patient verbalized understanding.

## 2024-05-28 NOTE — Telephone Encounter (Signed)
 Sample has been placed at the front desk.  Nothing further needed.

## 2024-05-28 NOTE — Telephone Encounter (Signed)
 Copied from CRM [id number]. Topic: Clinical - Prescription Issue >> May 28, 2024  8:48 AM Ismael A wrote: Reason for CRM: patient is requesting a sample of Trelegy Ellipta , she states she went to [facility] and they were charging a copay of $500 which she states she cannot afford - she is requesting a call back with an update on whether she can be provided a sample >> May 28, 2024  8:54 AM Leila BROCKS wrote: Patient [phone] wants to see if there's samples of Fluticasone -Umeclidin-Vilant (TRELEGY ELLIPTA ) 200-62.5-25 MCG/ACT AEPB in the office? Please advise and call back.

## 2024-05-29 ENCOUNTER — Telehealth: Payer: Self-pay | Admitting: Pharmacist

## 2024-05-29 DIAGNOSIS — J479 Bronchiectasis, uncomplicated: Secondary | ICD-10-CM

## 2024-05-29 DIAGNOSIS — J4540 Moderate persistent asthma, uncomplicated: Secondary | ICD-10-CM

## 2024-05-29 NOTE — Progress Notes (Signed)
" ° °  05/29/2024 Name: Sarah [name] MRN: [id number] DOB: Mar 20, 1951  Subjective  Chief Complaint  Patient presents with   Medication Access   COPD    Care Team: Primary Care Provider: [name], MD  Reason for visit: ?  Sarah [name] is a 74 y.o. female who presents with medication access concerns regarding their Trelegy inhaler. ?   Medication Access: ?  Reports that all medications are not affordable.  Trelegy >$400 at the pharmacy in January. Breztri  given inpatient at recent hospital encounter. No prior outpatient use.  All other medications have been affordable so far in 2026.   Prescription drug coverage: YES UHC Medicare Advantage [place]-0015 (HMO-POS); Plan ID: [id number] Summary of Benefit: [email or web address] Rx Deductible: $440 Brand Medication (Tier 3):  18% co-insurance after meeting deductible   Current Patient Assistance: None  Patient lives in a household of 1 with an estimated combined annual income of <250% % [employer]. via [employer].  Medicare LIS Eligible: No. Savings exceeds limit (401k).   Assessment and Plan:   1. Medication Access - patient responsible for 18% of cash price of brands medications in 2026 meaning all brand medications will likely remain unaffordable.  Trelegy PAP requires patients to spend $600 at the pharmacy before they will be approved for the program.  Breztri  (also LABA/LAMA/ICS) offered through [facility] without spend requirement. Patient tolerated inpatient previously.  Will discuss recommendation with PCP/Pulm (Dr. Dedra Sanders, MD)  Future Appointments  Date Time Provider Department Center  06/11/2024  1:00 PM [facility] MM [facility]2 [facility]  07/05/2024  3:30 PM [name], MD [facility] [id number]  07/16/2024  3:15 PM Sanders Dedra CROME, MD [facility] [id number]  08/05/2024 10:00 AM [facility] None   08/05/2024 10:15 AM [name], MD [facility] None   Manuelita FABIENE Kobs, PharmD, BCACP, CPP Clinical Pharmacist Practitioner [facility] Ph: [phone]  "

## 2024-05-29 NOTE — Telephone Encounter (Signed)
 noted

## 2024-05-30 NOTE — Telephone Encounter (Signed)
 Switch to Breztri  should be okay.

## 2024-05-31 ENCOUNTER — Encounter: Payer: Self-pay | Admitting: Pharmacist

## 2024-05-31 ENCOUNTER — Telehealth: Payer: Self-pay | Admitting: Internal Medicine

## 2024-05-31 NOTE — Progress Notes (Signed)
 Patient Assistance Program (PAP) Application   Manufacturer: [employer] ([employer]    (New enrollment) Medication(s): Breztri  (will replace Trelegy)   Patient Portion of Application:  05/30/24: Filled out and uploaded to clinic eFax folder for patient signature in front office.   Income Documentation: N/A - Electronic verification elected.  Provider Portion of Application:  05/31/24: Provider portion completed by PharmD and uploaded PCP eFax folder for signature. Clinical Pool/CMA notified.  Prescription(s): Included in PAP application.   Next Steps: [x]    Provider portion of application filled out and uploaded to [facility] eFax folder for review/signature []    CMA: Upon PCP signature, Application to be faxed to [facility] PAP team AND scanned to chart: [facility] PAP Team: CPhT Patient Advocate Team Fax: [phone] []    Med Advocate Team: Upon receipt via Med Advocate Onbase, please fax/submit to program.   Note routed to PCP Clinic Pool to ensure PCP signature is obtained and application is faxed. Patient Advocate PAP Spreadsheet updated and Pool cc'd as FYI (application not submitted by clinic, Advocate team to fax to company once received)  [facility] clinic team - Please document as Next Steps are completed in office*

## 2024-05-31 NOTE — Telephone Encounter (Signed)
 Pt dropped off a note with financial information to be added to PAP. It's in the pharmacist mailbox up front

## 2024-06-11 ENCOUNTER — Encounter

## 2024-07-05 ENCOUNTER — Ambulatory Visit: Admitting: Internal Medicine

## 2024-07-16 ENCOUNTER — Ambulatory Visit: Admitting: Pulmonary Disease

## 2024-08-05 ENCOUNTER — Inpatient Hospital Stay: Admitting: Internal Medicine

## 2024-08-05 ENCOUNTER — Inpatient Hospital Stay
